# Patient Record
Sex: Female | Born: 1950 | Race: Black or African American | Hispanic: No | Smoking: Former smoker
Health system: Southern US, Community
[De-identification: ages and names within clinical notes are randomized; demographics above are authoritative.]

## PROBLEM LIST (undated history)

## (undated) DIAGNOSIS — I472 Ventricular tachycardia, unspecified: Secondary | ICD-10-CM

## (undated) DIAGNOSIS — M1990 Unspecified osteoarthritis, unspecified site: Secondary | ICD-10-CM

## (undated) DIAGNOSIS — I340 Nonrheumatic mitral (valve) insufficiency: Secondary | ICD-10-CM

## (undated) DIAGNOSIS — I471 Supraventricular tachycardia: Secondary | ICD-10-CM

## (undated) DIAGNOSIS — M810 Age-related osteoporosis without current pathological fracture: Secondary | ICD-10-CM

## (undated) DIAGNOSIS — I480 Paroxysmal atrial fibrillation: Secondary | ICD-10-CM

## (undated) DIAGNOSIS — E785 Hyperlipidemia, unspecified: Secondary | ICD-10-CM

## (undated) DIAGNOSIS — E039 Hypothyroidism, unspecified: Secondary | ICD-10-CM

## (undated) DIAGNOSIS — K5792 Diverticulitis of intestine, part unspecified, without perforation or abscess without bleeding: Secondary | ICD-10-CM

## (undated) DIAGNOSIS — N183 Chronic kidney disease, stage 3 unspecified: Secondary | ICD-10-CM

## (undated) DIAGNOSIS — I255 Ischemic cardiomyopathy: Secondary | ICD-10-CM

## (undated) DIAGNOSIS — I513 Intracardiac thrombosis, not elsewhere classified: Secondary | ICD-10-CM

## (undated) DIAGNOSIS — I219 Acute myocardial infarction, unspecified: Secondary | ICD-10-CM

## (undated) DIAGNOSIS — M4800 Spinal stenosis, site unspecified: Secondary | ICD-10-CM

## (undated) DIAGNOSIS — K219 Gastro-esophageal reflux disease without esophagitis: Secondary | ICD-10-CM

## (undated) DIAGNOSIS — Z95810 Presence of automatic (implantable) cardiac defibrillator: Secondary | ICD-10-CM

## (undated) DIAGNOSIS — B370 Candidal stomatitis: Secondary | ICD-10-CM

## (undated) DIAGNOSIS — K2950 Unspecified chronic gastritis without bleeding: Secondary | ICD-10-CM

## (undated) DIAGNOSIS — Z9289 Personal history of other medical treatment: Secondary | ICD-10-CM

## (undated) DIAGNOSIS — R200 Anesthesia of skin: Secondary | ICD-10-CM

## (undated) DIAGNOSIS — I4719 Other supraventricular tachycardia: Secondary | ICD-10-CM

## (undated) DIAGNOSIS — I10 Essential (primary) hypertension: Secondary | ICD-10-CM

## (undated) DIAGNOSIS — R001 Bradycardia, unspecified: Secondary | ICD-10-CM

## (undated) DIAGNOSIS — IMO0002 Reserved for concepts with insufficient information to code with codable children: Secondary | ICD-10-CM

## (undated) DIAGNOSIS — Z8719 Personal history of other diseases of the digestive system: Secondary | ICD-10-CM

## (undated) DIAGNOSIS — I639 Cerebral infarction, unspecified: Secondary | ICD-10-CM

## (undated) DIAGNOSIS — I5022 Chronic systolic (congestive) heart failure: Secondary | ICD-10-CM

## (undated) DIAGNOSIS — K559 Vascular disorder of intestine, unspecified: Secondary | ICD-10-CM

## (undated) DIAGNOSIS — D649 Anemia, unspecified: Secondary | ICD-10-CM

## (undated) DIAGNOSIS — I2510 Atherosclerotic heart disease of native coronary artery without angina pectoris: Secondary | ICD-10-CM

## (undated) DIAGNOSIS — I4892 Unspecified atrial flutter: Secondary | ICD-10-CM

## (undated) DIAGNOSIS — T82198A Other mechanical complication of other cardiac electronic device, initial encounter: Secondary | ICD-10-CM

## (undated) DIAGNOSIS — R011 Cardiac murmur, unspecified: Secondary | ICD-10-CM

## (undated) DIAGNOSIS — Z8673 Personal history of transient ischemic attack (TIA), and cerebral infarction without residual deficits: Secondary | ICD-10-CM

## (undated) HISTORY — PX: GLAUCOMA SURGERY: SHX656

## (undated) HISTORY — DX: Spinal stenosis, site unspecified: M48.00

## (undated) HISTORY — DX: Unspecified osteoarthritis, unspecified site: M19.90

## (undated) HISTORY — DX: Atherosclerotic heart disease of native coronary artery without angina pectoris: I25.10

## (undated) HISTORY — DX: Diverticulitis of intestine, part unspecified, without perforation or abscess without bleeding: K57.92

## (undated) HISTORY — PX: CORONARY ANGIOPLASTY WITH STENT PLACEMENT: SHX49

## (undated) HISTORY — DX: Ischemic cardiomyopathy: I25.5

## (undated) HISTORY — PX: JOINT REPLACEMENT: SHX530

## (undated) HISTORY — DX: Essential (primary) hypertension: I10

## (undated) HISTORY — DX: Vascular disorder of intestine, unspecified: K55.9

## (undated) HISTORY — DX: Anesthesia of skin: R20.0

## (undated) HISTORY — PX: BACK SURGERY: SHX140

## (undated) HISTORY — DX: Chronic kidney disease, stage 3 unspecified: N18.30

## (undated) HISTORY — DX: Intracardiac thrombosis, not elsewhere classified: I51.3

## (undated) HISTORY — DX: Chronic kidney disease, stage 3 (moderate): N18.3

## (undated) HISTORY — DX: Hypothyroidism, unspecified: E03.9

## (undated) HISTORY — PX: KNEE ARTHROSCOPY: SHX127

## (undated) HISTORY — DX: Cerebral infarction, unspecified: I63.9

## (undated) HISTORY — DX: Other mechanical complication of other cardiac electronic device, initial encounter: T82.198A

## (undated) HISTORY — DX: Gastro-esophageal reflux disease without esophagitis: K21.9

## (undated) HISTORY — DX: Hyperlipidemia, unspecified: E78.5

## (undated) HISTORY — PX: CARDIAC DEFIBRILLATOR PLACEMENT: SHX171

---
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
## 1978-11-20 HISTORY — PX: KNEE ARTHROSCOPY: SHX127

## 1997-10-30 ENCOUNTER — Encounter: Admission: RE | Admit: 1997-10-30 | Discharge: 1998-01-28 | Payer: Self-pay | Admitting: Anesthesiology

## 1998-02-06 ENCOUNTER — Encounter: Admission: RE | Admit: 1998-02-06 | Discharge: 1998-04-30 | Payer: Self-pay

## 1998-03-21 DIAGNOSIS — I219 Acute myocardial infarction, unspecified: Secondary | ICD-10-CM

## 1998-03-21 HISTORY — DX: Acute myocardial infarction, unspecified: I21.9

## 1998-05-13 ENCOUNTER — Ambulatory Visit (HOSPITAL_COMMUNITY): Admission: RE | Admit: 1998-05-13 | Discharge: 1998-05-13 | Payer: Self-pay | Admitting: Gastroenterology

## 1998-07-30 ENCOUNTER — Encounter: Admission: RE | Admit: 1998-07-30 | Discharge: 1998-10-28 | Payer: Self-pay | Admitting: Anesthesiology

## 1998-07-31 ENCOUNTER — Encounter: Payer: Self-pay | Admitting: Anesthesiology

## 1999-09-16 ENCOUNTER — Encounter: Admission: RE | Admit: 1999-09-16 | Discharge: 1999-12-15 | Payer: Self-pay | Admitting: Anesthesiology

## 1999-10-07 ENCOUNTER — Encounter: Admission: RE | Admit: 1999-10-07 | Discharge: 1999-10-07 | Payer: Self-pay | Admitting: Internal Medicine

## 1999-10-07 ENCOUNTER — Encounter: Payer: Self-pay | Admitting: Internal Medicine

## 2000-06-29 ENCOUNTER — Encounter: Payer: Self-pay | Admitting: Emergency Medicine

## 2000-06-29 ENCOUNTER — Emergency Department (HOSPITAL_COMMUNITY): Admission: EM | Admit: 2000-06-29 | Discharge: 2000-06-29 | Payer: Self-pay | Admitting: Emergency Medicine

## 2000-10-05 ENCOUNTER — Encounter: Payer: Self-pay | Admitting: Internal Medicine

## 2000-10-05 ENCOUNTER — Ambulatory Visit (HOSPITAL_COMMUNITY): Admission: RE | Admit: 2000-10-05 | Discharge: 2000-10-05 | Payer: Self-pay | Admitting: Internal Medicine

## 2001-10-19 HISTORY — PX: LUMBAR LAMINECTOMY/DECOMPRESSION MICRODISCECTOMY: SHX5026

## 2001-10-23 ENCOUNTER — Encounter: Payer: Self-pay | Admitting: Neurosurgery

## 2001-10-24 ENCOUNTER — Encounter: Payer: Self-pay | Admitting: Neurosurgery

## 2001-10-24 ENCOUNTER — Inpatient Hospital Stay (HOSPITAL_COMMUNITY): Admission: RE | Admit: 2001-10-24 | Discharge: 2001-10-26 | Payer: Self-pay | Admitting: Neurosurgery

## 2001-11-03 ENCOUNTER — Encounter: Payer: Self-pay | Admitting: Cardiology

## 2001-11-03 ENCOUNTER — Inpatient Hospital Stay (HOSPITAL_COMMUNITY): Admission: EM | Admit: 2001-11-03 | Discharge: 2001-11-10 | Payer: Self-pay | Admitting: Emergency Medicine

## 2001-11-05 ENCOUNTER — Encounter: Payer: Self-pay | Admitting: Cardiology

## 2001-11-06 ENCOUNTER — Encounter: Payer: Self-pay | Admitting: Cardiology

## 2001-11-25 ENCOUNTER — Inpatient Hospital Stay (HOSPITAL_COMMUNITY): Admission: EM | Admit: 2001-11-25 | Discharge: 2001-11-27 | Payer: Self-pay

## 2002-02-07 ENCOUNTER — Encounter: Payer: Self-pay | Admitting: Emergency Medicine

## 2002-02-08 ENCOUNTER — Encounter: Payer: Self-pay | Admitting: Cardiology

## 2002-02-08 ENCOUNTER — Encounter: Payer: Self-pay | Admitting: Cardiovascular Disease

## 2002-02-08 ENCOUNTER — Inpatient Hospital Stay (HOSPITAL_COMMUNITY): Admission: EM | Admit: 2002-02-08 | Discharge: 2002-02-10 | Payer: Self-pay | Admitting: Emergency Medicine

## 2002-05-07 ENCOUNTER — Encounter: Admission: RE | Admit: 2002-05-07 | Discharge: 2002-05-07 | Payer: Self-pay | Admitting: Internal Medicine

## 2002-05-07 ENCOUNTER — Encounter: Payer: Self-pay | Admitting: Internal Medicine

## 2002-05-13 ENCOUNTER — Encounter (HOSPITAL_COMMUNITY): Admission: RE | Admit: 2002-05-13 | Discharge: 2002-08-11 | Payer: Self-pay | Admitting: Cardiology

## 2002-06-12 ENCOUNTER — Encounter: Payer: Self-pay | Admitting: Cardiology

## 2002-06-12 ENCOUNTER — Inpatient Hospital Stay (HOSPITAL_COMMUNITY): Admission: RE | Admit: 2002-06-12 | Discharge: 2002-06-14 | Payer: Self-pay | Admitting: Cardiology

## 2002-08-12 ENCOUNTER — Encounter (HOSPITAL_COMMUNITY): Admission: RE | Admit: 2002-08-12 | Discharge: 2002-11-10 | Payer: Self-pay | Admitting: Cardiology

## 2002-09-05 ENCOUNTER — Emergency Department (HOSPITAL_COMMUNITY): Admission: EM | Admit: 2002-09-05 | Discharge: 2002-09-05 | Payer: Self-pay | Admitting: Emergency Medicine

## 2002-09-05 ENCOUNTER — Encounter: Payer: Self-pay | Admitting: Emergency Medicine

## 2002-10-07 ENCOUNTER — Ambulatory Visit (HOSPITAL_COMMUNITY): Admission: RE | Admit: 2002-10-07 | Discharge: 2002-10-08 | Payer: Self-pay | Admitting: Cardiology

## 2003-03-05 ENCOUNTER — Ambulatory Visit (HOSPITAL_COMMUNITY): Admission: RE | Admit: 2003-03-05 | Discharge: 2003-03-05 | Payer: Self-pay | Admitting: Gastroenterology

## 2003-03-08 ENCOUNTER — Inpatient Hospital Stay (HOSPITAL_COMMUNITY): Admission: EM | Admit: 2003-03-08 | Discharge: 2003-03-12 | Payer: Self-pay | Admitting: Emergency Medicine

## 2003-03-11 ENCOUNTER — Encounter: Payer: Self-pay | Admitting: Cardiology

## 2003-03-14 ENCOUNTER — Ambulatory Visit (HOSPITAL_COMMUNITY): Admission: RE | Admit: 2003-03-14 | Discharge: 2003-03-14 | Payer: Self-pay | Admitting: Neurology

## 2003-07-31 ENCOUNTER — Inpatient Hospital Stay (HOSPITAL_COMMUNITY): Admission: AD | Admit: 2003-07-31 | Discharge: 2003-08-01 | Payer: Self-pay | Admitting: Internal Medicine

## 2003-11-17 ENCOUNTER — Encounter: Admission: RE | Admit: 2003-11-17 | Discharge: 2003-11-17 | Payer: Self-pay | Admitting: Nephrology

## 2003-11-20 HISTORY — PX: TOTAL KNEE ARTHROPLASTY: SHX125

## 2003-12-02 ENCOUNTER — Encounter: Admission: RE | Admit: 2003-12-02 | Discharge: 2003-12-02 | Payer: Self-pay | Admitting: Nephrology

## 2003-12-17 ENCOUNTER — Inpatient Hospital Stay (HOSPITAL_COMMUNITY): Admission: RE | Admit: 2003-12-17 | Discharge: 2003-12-22 | Payer: Self-pay | Admitting: Orthopedic Surgery

## 2003-12-17 ENCOUNTER — Ambulatory Visit: Payer: Self-pay | Admitting: Physical Medicine & Rehabilitation

## 2004-01-23 ENCOUNTER — Encounter: Admission: RE | Admit: 2004-01-23 | Discharge: 2004-04-16 | Payer: Self-pay | Admitting: Orthopedic Surgery

## 2004-06-01 ENCOUNTER — Encounter: Admission: RE | Admit: 2004-06-01 | Discharge: 2004-07-14 | Payer: Self-pay | Admitting: Neurosurgery

## 2004-06-15 ENCOUNTER — Encounter: Admission: RE | Admit: 2004-06-15 | Discharge: 2004-06-15 | Payer: Self-pay | Admitting: Internal Medicine

## 2004-07-29 ENCOUNTER — Ambulatory Visit (HOSPITAL_COMMUNITY): Admission: RE | Admit: 2004-07-29 | Discharge: 2004-07-29 | Payer: Self-pay | Admitting: Neurosurgery

## 2004-08-19 ENCOUNTER — Ambulatory Visit: Payer: Self-pay | Admitting: Internal Medicine

## 2004-08-19 ENCOUNTER — Ambulatory Visit: Payer: Self-pay

## 2004-10-07 ENCOUNTER — Ambulatory Visit: Payer: Self-pay | Admitting: Cardiology

## 2004-11-02 ENCOUNTER — Ambulatory Visit: Payer: Self-pay | Admitting: Internal Medicine

## 2004-11-03 ENCOUNTER — Ambulatory Visit: Payer: Self-pay | Admitting: Internal Medicine

## 2004-11-05 ENCOUNTER — Inpatient Hospital Stay (HOSPITAL_COMMUNITY): Admission: RE | Admit: 2004-11-05 | Discharge: 2004-11-05 | Payer: Self-pay | Admitting: Internal Medicine

## 2004-11-24 ENCOUNTER — Ambulatory Visit: Payer: Self-pay

## 2004-11-24 ENCOUNTER — Ambulatory Visit: Payer: Self-pay | Admitting: Internal Medicine

## 2004-12-19 HISTORY — PX: X-STOP IMPLANTATION: SHX2677

## 2004-12-20 ENCOUNTER — Inpatient Hospital Stay (HOSPITAL_COMMUNITY): Admission: RE | Admit: 2004-12-20 | Discharge: 2004-12-22 | Payer: Self-pay | Admitting: Neurosurgery

## 2005-01-25 ENCOUNTER — Ambulatory Visit: Payer: Self-pay | Admitting: Internal Medicine

## 2005-03-29 ENCOUNTER — Encounter: Admission: RE | Admit: 2005-03-29 | Discharge: 2005-06-27 | Payer: Self-pay | Admitting: Neurosurgery

## 2005-06-07 ENCOUNTER — Inpatient Hospital Stay (HOSPITAL_COMMUNITY): Admission: EM | Admit: 2005-06-07 | Discharge: 2005-06-12 | Payer: Self-pay | Admitting: *Deleted

## 2005-06-22 ENCOUNTER — Encounter: Admission: RE | Admit: 2005-06-22 | Discharge: 2005-06-22 | Payer: Self-pay | Admitting: Internal Medicine

## 2005-08-23 ENCOUNTER — Encounter: Admission: RE | Admit: 2005-08-23 | Discharge: 2005-09-15 | Payer: Self-pay | Admitting: Neurosurgery

## 2006-04-07 ENCOUNTER — Encounter: Admission: RE | Admit: 2006-04-07 | Discharge: 2006-04-07 | Payer: Self-pay | Admitting: Cardiology

## 2006-06-23 ENCOUNTER — Emergency Department (HOSPITAL_COMMUNITY): Admission: EM | Admit: 2006-06-23 | Discharge: 2006-06-23 | Payer: Self-pay | Admitting: Emergency Medicine

## 2006-06-27 ENCOUNTER — Encounter: Admission: RE | Admit: 2006-06-27 | Discharge: 2006-06-27 | Payer: Self-pay | Admitting: Internal Medicine

## 2007-03-31 ENCOUNTER — Inpatient Hospital Stay (HOSPITAL_COMMUNITY): Admission: EM | Admit: 2007-03-31 | Discharge: 2007-04-01 | Payer: Self-pay | Admitting: Emergency Medicine

## 2007-05-02 ENCOUNTER — Encounter
Admission: RE | Admit: 2007-05-02 | Discharge: 2007-07-17 | Payer: Self-pay | Admitting: Physical Medicine and Rehabilitation

## 2007-06-28 ENCOUNTER — Emergency Department (HOSPITAL_COMMUNITY): Admission: EM | Admit: 2007-06-28 | Discharge: 2007-06-28 | Payer: Self-pay | Admitting: Emergency Medicine

## 2007-07-10 ENCOUNTER — Encounter: Admission: RE | Admit: 2007-07-10 | Discharge: 2007-07-10 | Payer: Self-pay | Admitting: Orthopedic Surgery

## 2007-07-20 HISTORY — PX: FIXATION KYPHOPLASTY THORACIC SPINE: SHX1643

## 2007-08-09 ENCOUNTER — Encounter (INDEPENDENT_AMBULATORY_CARE_PROVIDER_SITE_OTHER): Payer: Self-pay | Admitting: Neurosurgery

## 2007-08-09 ENCOUNTER — Inpatient Hospital Stay (HOSPITAL_COMMUNITY): Admission: RE | Admit: 2007-08-09 | Discharge: 2007-08-10 | Payer: Self-pay | Admitting: Neurosurgery

## 2007-11-27 ENCOUNTER — Encounter: Admission: RE | Admit: 2007-11-27 | Discharge: 2007-11-27 | Payer: Self-pay | Admitting: Internal Medicine

## 2008-03-31 ENCOUNTER — Encounter: Admission: RE | Admit: 2008-03-31 | Discharge: 2008-03-31 | Payer: Self-pay | Admitting: Orthopedic Surgery

## 2008-04-21 HISTORY — PX: SHOULDER OPEN ROTATOR CUFF REPAIR: SHX2407

## 2008-05-14 ENCOUNTER — Inpatient Hospital Stay (HOSPITAL_COMMUNITY): Admission: AD | Admit: 2008-05-14 | Discharge: 2008-05-16 | Payer: Self-pay | Admitting: Orthopedic Surgery

## 2008-10-19 LAB — CONVERTED CEMR LAB: Pap Smear: NORMAL

## 2008-11-20 ENCOUNTER — Telehealth (INDEPENDENT_AMBULATORY_CARE_PROVIDER_SITE_OTHER): Payer: Self-pay | Admitting: *Deleted

## 2009-02-23 ENCOUNTER — Encounter (INDEPENDENT_AMBULATORY_CARE_PROVIDER_SITE_OTHER): Payer: Self-pay | Admitting: *Deleted

## 2009-02-26 ENCOUNTER — Encounter: Admission: RE | Admit: 2009-02-26 | Discharge: 2009-02-26 | Payer: Self-pay | Admitting: Gastroenterology

## 2009-03-18 ENCOUNTER — Ambulatory Visit: Payer: Self-pay | Admitting: Internal Medicine

## 2009-03-18 ENCOUNTER — Inpatient Hospital Stay (HOSPITAL_COMMUNITY): Admission: EM | Admit: 2009-03-18 | Discharge: 2009-03-23 | Payer: Self-pay | Admitting: Emergency Medicine

## 2009-04-29 ENCOUNTER — Ambulatory Visit: Payer: Self-pay | Admitting: Cardiovascular Disease

## 2009-04-29 ENCOUNTER — Observation Stay (HOSPITAL_COMMUNITY): Admission: EM | Admit: 2009-04-29 | Discharge: 2009-04-30 | Payer: Self-pay | Admitting: Emergency Medicine

## 2009-07-14 ENCOUNTER — Inpatient Hospital Stay (HOSPITAL_COMMUNITY)
Admission: EM | Admit: 2009-07-14 | Discharge: 2009-07-16 | Payer: Self-pay | Source: Home / Self Care | Admitting: Emergency Medicine

## 2009-07-14 ENCOUNTER — Encounter: Payer: Self-pay | Admitting: Cardiology

## 2009-07-14 ENCOUNTER — Ambulatory Visit: Payer: Self-pay | Admitting: Vascular Surgery

## 2009-07-14 ENCOUNTER — Ambulatory Visit: Payer: Self-pay | Admitting: Internal Medicine

## 2009-07-15 ENCOUNTER — Encounter: Payer: Self-pay | Admitting: Cardiology

## 2009-08-21 ENCOUNTER — Encounter: Admission: RE | Admit: 2009-08-21 | Discharge: 2009-08-21 | Payer: Self-pay | Admitting: Surgery

## 2009-10-22 ENCOUNTER — Ambulatory Visit (HOSPITAL_COMMUNITY): Admission: RE | Admit: 2009-10-22 | Discharge: 2009-10-22 | Payer: Self-pay | Admitting: Surgery

## 2009-10-28 ENCOUNTER — Inpatient Hospital Stay (HOSPITAL_COMMUNITY): Admission: EM | Admit: 2009-10-28 | Discharge: 2009-11-05 | Payer: Self-pay | Admitting: Emergency Medicine

## 2009-11-03 ENCOUNTER — Encounter (INDEPENDENT_AMBULATORY_CARE_PROVIDER_SITE_OTHER): Payer: Self-pay | Admitting: Internal Medicine

## 2009-11-11 ENCOUNTER — Encounter: Payer: Self-pay | Admitting: Internal Medicine

## 2009-11-11 ENCOUNTER — Ambulatory Visit: Payer: Self-pay | Admitting: Cardiology

## 2009-11-20 ENCOUNTER — Ambulatory Visit: Payer: Self-pay | Admitting: Internal Medicine

## 2009-11-20 DIAGNOSIS — I252 Old myocardial infarction: Secondary | ICD-10-CM

## 2009-11-20 DIAGNOSIS — I5042 Chronic combined systolic (congestive) and diastolic (congestive) heart failure: Secondary | ICD-10-CM

## 2009-11-20 DIAGNOSIS — M1990 Unspecified osteoarthritis, unspecified site: Secondary | ICD-10-CM | POA: Insufficient documentation

## 2009-11-20 DIAGNOSIS — Z8719 Personal history of other diseases of the digestive system: Secondary | ICD-10-CM

## 2009-11-20 DIAGNOSIS — E039 Hypothyroidism, unspecified: Secondary | ICD-10-CM | POA: Insufficient documentation

## 2009-11-20 LAB — CONVERTED CEMR LAB
ALT: 18 units/L (ref 0–35)
AST: 35 units/L (ref 0–37)
Alkaline Phosphatase: 38 units/L — ABNORMAL LOW (ref 39–117)
Basophils Relative: 0.2 % (ref 0.0–3.0)
Bilirubin, Direct: 0.2 mg/dL (ref 0.0–0.3)
Calcium: 8.7 mg/dL (ref 8.4–10.5)
Creatinine, Ser: 1 mg/dL (ref 0.4–1.2)
Eosinophils Absolute: 0.2 10*3/uL (ref 0.0–0.7)
Eosinophils Relative: 3.8 % (ref 0.0–5.0)
GFR calc non Af Amer: 71.89 mL/min (ref >60)
Hemoglobin: 12.3 g/dL (ref 12.0–15.0)
Lymphocytes Relative: 25.5 % (ref 12.0–46.0)
Monocytes Relative: 11.4 % (ref 3.0–12.0)
Neutro Abs: 3.2 10*3/uL (ref 1.4–7.7)
Neutrophils Relative %: 59.1 % (ref 43.0–77.0)
RBC: 3.5 M/uL — ABNORMAL LOW (ref 3.87–5.11)
Sodium: 139 meq/L (ref 135–145)
Total Protein: 6.6 g/dL (ref 6.0–8.3)
WBC: 5.4 10*3/uL (ref 4.5–10.5)

## 2009-11-22 DIAGNOSIS — I10 Essential (primary) hypertension: Secondary | ICD-10-CM | POA: Insufficient documentation

## 2009-11-22 DIAGNOSIS — E785 Hyperlipidemia, unspecified: Secondary | ICD-10-CM

## 2009-12-02 ENCOUNTER — Ambulatory Visit: Payer: Self-pay | Admitting: Internal Medicine

## 2009-12-08 ENCOUNTER — Encounter: Payer: Self-pay | Admitting: Internal Medicine

## 2009-12-15 ENCOUNTER — Ambulatory Visit: Payer: Self-pay | Admitting: Cardiology

## 2009-12-15 ENCOUNTER — Encounter: Payer: Self-pay | Admitting: Internal Medicine

## 2009-12-29 ENCOUNTER — Encounter: Payer: Self-pay | Admitting: Internal Medicine

## 2010-01-01 ENCOUNTER — Encounter (HOSPITAL_COMMUNITY)
Admission: RE | Admit: 2010-01-01 | Discharge: 2010-03-20 | Payer: Self-pay | Source: Home / Self Care | Attending: Cardiology | Admitting: Cardiology

## 2010-01-13 ENCOUNTER — Encounter: Admission: RE | Admit: 2010-01-13 | Discharge: 2010-01-13 | Payer: Self-pay | Admitting: Orthopedic Surgery

## 2010-01-13 ENCOUNTER — Telehealth: Payer: Self-pay | Admitting: Internal Medicine

## 2010-01-19 HISTORY — PX: LUMBAR LAMINECTOMY/DECOMPRESSION MICRODISCECTOMY: SHX5026

## 2010-01-20 ENCOUNTER — Encounter: Payer: Self-pay | Admitting: Internal Medicine

## 2010-01-21 ENCOUNTER — Ambulatory Visit: Payer: Self-pay | Admitting: Cardiology

## 2010-01-22 ENCOUNTER — Ambulatory Visit (HOSPITAL_COMMUNITY): Admission: RE | Admit: 2010-01-22 | Discharge: 2010-01-22 | Payer: Self-pay | Admitting: Neurosurgery

## 2010-01-27 ENCOUNTER — Encounter: Payer: Self-pay | Admitting: Internal Medicine

## 2010-02-01 ENCOUNTER — Ambulatory Visit: Payer: Self-pay | Admitting: Cardiology

## 2010-02-01 ENCOUNTER — Ambulatory Visit: Payer: Self-pay | Admitting: Internal Medicine

## 2010-02-08 ENCOUNTER — Ambulatory Visit: Payer: Self-pay | Admitting: Cardiology

## 2010-02-08 ENCOUNTER — Inpatient Hospital Stay (HOSPITAL_COMMUNITY): Admission: RE | Admit: 2010-02-08 | Discharge: 2010-02-16 | Payer: Self-pay | Admitting: Neurosurgery

## 2010-02-08 ENCOUNTER — Ambulatory Visit: Payer: Self-pay | Admitting: Internal Medicine

## 2010-02-15 ENCOUNTER — Ambulatory Visit: Payer: Self-pay | Admitting: Physical Medicine & Rehabilitation

## 2010-02-16 ENCOUNTER — Inpatient Hospital Stay (HOSPITAL_COMMUNITY)
Admission: RE | Admit: 2010-02-16 | Discharge: 2010-02-26 | Payer: Self-pay | Attending: Physical Medicine & Rehabilitation | Admitting: Physical Medicine & Rehabilitation

## 2010-02-24 ENCOUNTER — Encounter (INDEPENDENT_AMBULATORY_CARE_PROVIDER_SITE_OTHER): Payer: Self-pay | Admitting: *Deleted

## 2010-02-26 ENCOUNTER — Encounter: Payer: Self-pay | Admitting: Internal Medicine

## 2010-03-20 ENCOUNTER — Inpatient Hospital Stay (HOSPITAL_COMMUNITY)
Admission: EM | Admit: 2010-03-20 | Discharge: 2010-04-07 | Disposition: A | Discharge status: Rehabilitation Facility | Payer: Self-pay | Source: Home / Self Care | Attending: Internal Medicine | Admitting: Internal Medicine

## 2010-03-21 HISTORY — PX: COLECTOMY: SHX59

## 2010-03-21 HISTORY — PX: APPENDECTOMY: SHX54

## 2010-03-21 HISTORY — PX: PERIPHERALLY INSERTED CENTRAL CATHETER INSERTION: SHX2221

## 2010-03-22 ENCOUNTER — Encounter: Payer: Self-pay | Admitting: Internal Medicine

## 2010-03-24 LAB — COMPREHENSIVE METABOLIC PANEL
ALT: 11 U/L (ref 0–35)
AST: 18 U/L (ref 0–37)
Albumin: 2.8 g/dL — ABNORMAL LOW (ref 3.5–5.2)
Alkaline Phosphatase: 86 U/L (ref 39–117)
BUN: 11 mg/dL (ref 6–23)
CO2: 26 mEq/L (ref 19–32)
Calcium: 8.3 mg/dL — ABNORMAL LOW (ref 8.4–10.5)
Chloride: 99 mEq/L (ref 96–112)
Creatinine, Ser: 1.09 mg/dL (ref 0.4–1.2)
GFR calc Af Amer: >60 mL/min (ref >60)
GFR calc non Af Amer: 51 mL/min — ABNORMAL LOW (ref >60)
Glucose, Bld: 147 mg/dL — ABNORMAL HIGH (ref 70–99)
Potassium: 3.3 mEq/L — ABNORMAL LOW (ref 3.5–5.1)
Sodium: 134 mEq/L — ABNORMAL LOW (ref 135–145)
Total Bilirubin: 0.9 mg/dL (ref 0.3–1.2)
Total Protein: 5.8 g/dL — ABNORMAL LOW (ref 6.0–8.3)

## 2010-03-24 LAB — TRIGLYCERIDES: Triglycerides: 153 mg/dL — ABNORMAL HIGH (ref <150)

## 2010-03-24 LAB — CBC
HCT: 33.1 % — ABNORMAL LOW (ref 36.0–46.0)
Hemoglobin: 11.1 g/dL — ABNORMAL LOW (ref 12.0–15.0)
MCH: 32.2 pg (ref 26.0–34.0)
MCHC: 33.5 g/dL (ref 30.0–36.0)
MCV: 95.9 fL (ref 78.0–100.0)
Platelets: 180 10*3/uL (ref 150–400)
RBC: 3.45 MIL/uL — ABNORMAL LOW (ref 3.87–5.11)
RDW: 16 % — ABNORMAL HIGH (ref 11.5–15.5)
WBC: 5.4 10*3/uL (ref 4.0–10.5)

## 2010-03-24 LAB — CHOLESTEROL, TOTAL: Cholesterol: 199 mg/dL (ref 0–200)

## 2010-03-24 LAB — APTT: aPTT: 41 seconds — ABNORMAL HIGH (ref 24–37)

## 2010-03-24 LAB — MAGNESIUM: Magnesium: 1.9 mg/dL (ref 1.5–2.5)

## 2010-03-24 LAB — TYPE AND SCREEN
ABO/RH(D): A POS
Antibody Screen: NEGATIVE

## 2010-03-24 LAB — PROTIME-INR
INR: 1.46 (ref 0.00–1.49)
Prothrombin Time: 17.9 seconds — ABNORMAL HIGH (ref 11.6–15.2)

## 2010-03-24 LAB — GLUCOSE, CAPILLARY
Glucose-Capillary: 136 mg/dL — ABNORMAL HIGH (ref 70–99)
Glucose-Capillary: 150 mg/dL — ABNORMAL HIGH (ref 70–99)
Glucose-Capillary: 162 mg/dL — ABNORMAL HIGH (ref 70–99)

## 2010-03-24 LAB — PREALBUMIN: Prealbumin: 13 mg/dL — ABNORMAL LOW (ref 17.0–34.0)

## 2010-03-24 LAB — PHOSPHORUS: Phosphorus: 2.9 mg/dL (ref 2.3–4.6)

## 2010-03-25 ENCOUNTER — Encounter (INDEPENDENT_AMBULATORY_CARE_PROVIDER_SITE_OTHER): Payer: Self-pay | Admitting: Internal Medicine

## 2010-03-25 LAB — GLUCOSE, CAPILLARY
Glucose-Capillary: 181 mg/dL — ABNORMAL HIGH (ref 70–99)
Glucose-Capillary: 166 mg/dL — ABNORMAL HIGH (ref 70–99)
Glucose-Capillary: 204 mg/dL — ABNORMAL HIGH (ref 70–99)
Glucose-Capillary: 204 mg/dL — ABNORMAL HIGH (ref 70–99)

## 2010-03-25 LAB — CBC
HCT: 36.9 % (ref 36.0–46.0)
Hemoglobin: 12.4 g/dL (ref 12.0–15.0)
MCH: 32.3 pg (ref 26.0–34.0)
MCHC: 33.6 g/dL (ref 30.0–36.0)
MCV: 96.1 fL (ref 78.0–100.0)
Platelets: 168 10*3/uL (ref 150–400)
RBC: 3.84 MIL/uL — ABNORMAL LOW (ref 3.87–5.11)
RDW: 15.9 % — ABNORMAL HIGH (ref 11.5–15.5)
WBC: 5 10*3/uL (ref 4.0–10.5)

## 2010-03-25 LAB — DIFFERENTIAL
Basophils Absolute: 0 10*3/uL (ref 0.0–0.1)
Basophils Relative: 0 % (ref 0–1)
Eosinophils Absolute: 0.2 10*3/uL (ref 0.0–0.7)
Eosinophils Relative: 3 % (ref 0–5)
Lymphocytes Relative: 19 % (ref 12–46)
Lymphs Abs: 1 10*3/uL (ref 0.7–4.0)
Monocytes Absolute: 0.7 10*3/uL (ref 0.1–1.0)
Monocytes Relative: 14 % — ABNORMAL HIGH (ref 3–12)
Neutro Abs: 3.5 10*3/uL (ref 1.7–7.7)
Neutrophils Relative %: 64 % (ref 43–77)

## 2010-03-25 LAB — PREPARE FRESH FROZEN PLASMA: Unit division: 0

## 2010-03-25 LAB — PROTIME-INR
INR: 1.2 (ref 0.00–1.49)
Prothrombin Time: 15.4 seconds — ABNORMAL HIGH (ref 11.6–15.2)

## 2010-03-25 LAB — COMPREHENSIVE METABOLIC PANEL
ALT: 9 U/L (ref 0–35)
AST: 18 U/L (ref 0–37)
Albumin: 2.6 g/dL — ABNORMAL LOW (ref 3.5–5.2)
Alkaline Phosphatase: 79 U/L (ref 39–117)
BUN: 18 mg/dL (ref 6–23)
CO2: 28 mEq/L (ref 19–32)
Calcium: 8.3 mg/dL — ABNORMAL LOW (ref 8.4–10.5)
Chloride: 100 mEq/L (ref 96–112)
Creatinine, Ser: 0.96 mg/dL (ref 0.4–1.2)
GFR calc Af Amer: >60 mL/min (ref >60)
GFR calc non Af Amer: 59 mL/min — ABNORMAL LOW (ref >60)
Glucose, Bld: 192 mg/dL — ABNORMAL HIGH (ref 70–99)
Potassium: 3.9 mEq/L (ref 3.5–5.1)
Sodium: 135 mEq/L (ref 135–145)
Total Bilirubin: 0.6 mg/dL (ref 0.3–1.2)
Total Protein: 5.7 g/dL — ABNORMAL LOW (ref 6.0–8.3)

## 2010-03-25 LAB — MRSA PCR SCREENING: MRSA by PCR: NEGATIVE

## 2010-03-25 LAB — ABO/RH: ABO/RH(D): A POS

## 2010-03-25 LAB — PHOSPHORUS: Phosphorus: 3 mg/dL (ref 2.3–4.6)

## 2010-03-25 LAB — MAGNESIUM: Magnesium: 2.3 mg/dL (ref 1.5–2.5)

## 2010-03-26 LAB — COMPREHENSIVE METABOLIC PANEL
ALT: 13 U/L (ref 0–35)
AST: 22 U/L (ref 0–37)
Albumin: 1.9 g/dL — ABNORMAL LOW (ref 3.5–5.2)
Alkaline Phosphatase: 64 U/L (ref 39–117)
BUN: 30 mg/dL — ABNORMAL HIGH (ref 6–23)
CO2: 22 mEq/L (ref 19–32)
Calcium: 7.7 mg/dL — ABNORMAL LOW (ref 8.4–10.5)
Chloride: 104 mEq/L (ref 96–112)
Creatinine, Ser: 1.31 mg/dL — ABNORMAL HIGH (ref 0.4–1.2)
GFR calc Af Amer: 50 mL/min — ABNORMAL LOW (ref >60)
GFR calc non Af Amer: 41 mL/min — ABNORMAL LOW (ref >60)
Glucose, Bld: 274 mg/dL — ABNORMAL HIGH (ref 70–99)
Potassium: 4.3 mEq/L (ref 3.5–5.1)
Sodium: 133 mEq/L — ABNORMAL LOW (ref 135–145)
Total Bilirubin: 1.4 mg/dL — ABNORMAL HIGH (ref 0.3–1.2)
Total Protein: 4.5 g/dL — ABNORMAL LOW (ref 6.0–8.3)

## 2010-03-26 LAB — CBC
HCT: 39.5 % (ref 36.0–46.0)
Hemoglobin: 13.3 g/dL (ref 12.0–15.0)
MCH: 32.4 pg (ref 26.0–34.0)
MCHC: 33.7 g/dL (ref 30.0–36.0)
MCV: 96.3 fL (ref 78.0–100.0)
Platelets: 141 10*3/uL — ABNORMAL LOW (ref 150–400)
RBC: 4.1 MIL/uL (ref 3.87–5.11)
RDW: 16.1 % — ABNORMAL HIGH (ref 11.5–15.5)
WBC: 9.2 10*3/uL (ref 4.0–10.5)

## 2010-03-26 LAB — GLUCOSE, CAPILLARY
Glucose-Capillary: 183 mg/dL — ABNORMAL HIGH (ref 70–99)
Glucose-Capillary: 209 mg/dL — ABNORMAL HIGH (ref 70–99)
Glucose-Capillary: 269 mg/dL — ABNORMAL HIGH (ref 70–99)
Glucose-Capillary: 285 mg/dL — ABNORMAL HIGH (ref 70–99)
Glucose-Capillary: 325 mg/dL — ABNORMAL HIGH (ref 70–99)

## 2010-03-26 LAB — PROTIME-INR
INR: 1.49 (ref 0.00–1.49)
Prothrombin Time: 18.2 seconds — ABNORMAL HIGH (ref 11.6–15.2)

## 2010-03-26 LAB — PHOSPHORUS: Phosphorus: 2.2 mg/dL — ABNORMAL LOW (ref 2.3–4.6)

## 2010-03-26 LAB — MAGNESIUM: Magnesium: 2 mg/dL (ref 1.5–2.5)

## 2010-03-31 ENCOUNTER — Encounter (INDEPENDENT_AMBULATORY_CARE_PROVIDER_SITE_OTHER): Payer: Self-pay | Admitting: *Deleted

## 2010-04-05 LAB — BASIC METABOLIC PANEL
BUN: 35 mg/dL — ABNORMAL HIGH (ref 6–23)
BUN: 35 mg/dL — ABNORMAL HIGH (ref 6–23)
BUN: 32 mg/dL — ABNORMAL HIGH (ref 6–23)
BUN: 19 mg/dL (ref 6–23)
BUN: 11 mg/dL (ref 6–23)
BUN: 11 mg/dL (ref 6–23)
BUN: 9 mg/dL (ref 6–23)
CO2: 25 mEq/L (ref 19–32)
CO2: 24 mEq/L (ref 19–32)
CO2: 23 mEq/L (ref 19–32)
CO2: 27 mEq/L (ref 19–32)
CO2: 29 mEq/L (ref 19–32)
CO2: 29 mEq/L (ref 19–32)
CO2: 29 mEq/L (ref 19–32)
Calcium: 7.5 mg/dL — ABNORMAL LOW (ref 8.4–10.5)
Calcium: 7.5 mg/dL — ABNORMAL LOW (ref 8.4–10.5)
Calcium: 7.2 mg/dL — ABNORMAL LOW (ref 8.4–10.5)
Calcium: 7.8 mg/dL — ABNORMAL LOW (ref 8.4–10.5)
Calcium: 7.7 mg/dL — ABNORMAL LOW (ref 8.4–10.5)
Calcium: 7.5 mg/dL — ABNORMAL LOW (ref 8.4–10.5)
Calcium: 7.6 mg/dL — ABNORMAL LOW (ref 8.4–10.5)
Chloride: 104 mEq/L (ref 96–112)
Chloride: 106 mEq/L (ref 96–112)
Chloride: 106 mEq/L (ref 96–112)
Chloride: 106 mEq/L (ref 96–112)
Chloride: 99 mEq/L (ref 96–112)
Chloride: 98 mEq/L (ref 96–112)
Chloride: 101 mEq/L (ref 96–112)
Creatinine, Ser: 1.31 mg/dL — ABNORMAL HIGH (ref 0.4–1.2)
Creatinine, Ser: 1.22 mg/dL — ABNORMAL HIGH (ref 0.4–1.2)
Creatinine, Ser: 1.01 mg/dL (ref 0.4–1.2)
Creatinine, Ser: 0.77 mg/dL (ref 0.4–1.2)
Creatinine, Ser: 0.79 mg/dL (ref 0.4–1.2)
Creatinine, Ser: 0.98 mg/dL (ref 0.4–1.2)
Creatinine, Ser: 0.99 mg/dL (ref 0.4–1.2)
GFR calc Af Amer: 50 mL/min — ABNORMAL LOW (ref >60)
GFR calc Af Amer: 54 mL/min — ABNORMAL LOW (ref >60)
GFR calc Af Amer: >60 mL/min (ref >60)
GFR calc Af Amer: >60 mL/min (ref >60)
GFR calc Af Amer: >60 mL/min (ref >60)
GFR calc Af Amer: >60 mL/min (ref >60)
GFR calc Af Amer: >60 mL/min (ref >60)
GFR calc non Af Amer: 41 mL/min — ABNORMAL LOW (ref >60)
GFR calc non Af Amer: 45 mL/min — ABNORMAL LOW (ref >60)
GFR calc non Af Amer: 56 mL/min — ABNORMAL LOW (ref >60)
GFR calc non Af Amer: >60 mL/min (ref >60)
GFR calc non Af Amer: >60 mL/min (ref >60)
GFR calc non Af Amer: 58 mL/min — ABNORMAL LOW (ref >60)
GFR calc non Af Amer: 57 mL/min — ABNORMAL LOW (ref >60)
Glucose, Bld: 198 mg/dL — ABNORMAL HIGH (ref 70–99)
Glucose, Bld: 169 mg/dL — ABNORMAL HIGH (ref 70–99)
Glucose, Bld: 85 mg/dL (ref 70–99)
Glucose, Bld: 111 mg/dL — ABNORMAL HIGH (ref 70–99)
Glucose, Bld: 107 mg/dL — ABNORMAL HIGH (ref 70–99)
Glucose, Bld: 94 mg/dL (ref 70–99)
Glucose, Bld: 82 mg/dL (ref 70–99)
Potassium: 4.4 mEq/L (ref 3.5–5.1)
Potassium: 4.5 mEq/L (ref 3.5–5.1)
Potassium: 4.4 mEq/L (ref 3.5–5.1)
Potassium: 4.4 mEq/L (ref 3.5–5.1)
Potassium: 3.9 mEq/L (ref 3.5–5.1)
Potassium: 4 mEq/L (ref 3.5–5.1)
Potassium: 3.2 mEq/L — ABNORMAL LOW (ref 3.5–5.1)
Sodium: 133 mEq/L — ABNORMAL LOW (ref 135–145)
Sodium: 135 mEq/L (ref 135–145)
Sodium: 132 mEq/L — ABNORMAL LOW (ref 135–145)
Sodium: 137 mEq/L (ref 135–145)
Sodium: 135 mEq/L (ref 135–145)
Sodium: 136 mEq/L (ref 135–145)
Sodium: 137 mEq/L (ref 135–145)

## 2010-04-05 LAB — COMPREHENSIVE METABOLIC PANEL
ALT: 11 U/L (ref 0–35)
ALT: 13 U/L (ref 0–35)
ALT: 13 U/L (ref 0–35)
AST: 19 U/L (ref 0–37)
AST: 19 U/L (ref 0–37)
AST: 16 U/L (ref 0–37)
Albumin: 1.8 g/dL — ABNORMAL LOW (ref 3.5–5.2)
Albumin: 1.9 g/dL — ABNORMAL LOW (ref 3.5–5.2)
Albumin: 1.9 g/dL — ABNORMAL LOW (ref 3.5–5.2)
Alkaline Phosphatase: 66 U/L (ref 39–117)
Alkaline Phosphatase: 58 U/L (ref 39–117)
Alkaline Phosphatase: 54 U/L (ref 39–117)
BUN: 36 mg/dL — ABNORMAL HIGH (ref 6–23)
BUN: 25 mg/dL — ABNORMAL HIGH (ref 6–23)
BUN: 15 mg/dL (ref 6–23)
CO2: 20 mEq/L (ref 19–32)
CO2: 25 mEq/L (ref 19–32)
CO2: 27 mEq/L (ref 19–32)
Calcium: 7.3 mg/dL — ABNORMAL LOW (ref 8.4–10.5)
Calcium: 7.6 mg/dL — ABNORMAL LOW (ref 8.4–10.5)
Calcium: 7.9 mg/dL — ABNORMAL LOW (ref 8.4–10.5)
Chloride: 106 mEq/L (ref 96–112)
Chloride: 107 mEq/L (ref 96–112)
Chloride: 105 mEq/L (ref 96–112)
Creatinine, Ser: 0.98 mg/dL (ref 0.4–1.2)
Creatinine, Ser: 0.85 mg/dL (ref 0.4–1.2)
Creatinine, Ser: 0.72 mg/dL (ref 0.4–1.2)
GFR calc Af Amer: >60 mL/min (ref >60)
GFR calc Af Amer: >60 mL/min (ref >60)
GFR calc Af Amer: >60 mL/min (ref >60)
GFR calc non Af Amer: 58 mL/min — ABNORMAL LOW (ref >60)
GFR calc non Af Amer: >60 mL/min (ref >60)
GFR calc non Af Amer: >60 mL/min (ref >60)
Glucose, Bld: 99 mg/dL (ref 70–99)
Glucose, Bld: 133 mg/dL — ABNORMAL HIGH (ref 70–99)
Glucose, Bld: 111 mg/dL — ABNORMAL HIGH (ref 70–99)
Potassium: 4.3 mEq/L (ref 3.5–5.1)
Potassium: 4.3 mEq/L (ref 3.5–5.1)
Potassium: 4.2 mEq/L (ref 3.5–5.1)
Sodium: 132 mEq/L — ABNORMAL LOW (ref 135–145)
Sodium: 136 mEq/L (ref 135–145)
Sodium: 138 mEq/L (ref 135–145)
Total Bilirubin: 0.9 mg/dL (ref 0.3–1.2)
Total Bilirubin: 0.7 mg/dL (ref 0.3–1.2)
Total Bilirubin: 0.5 mg/dL (ref 0.3–1.2)
Total Protein: 4.3 g/dL — ABNORMAL LOW (ref 6.0–8.3)
Total Protein: 4.4 g/dL — ABNORMAL LOW (ref 6.0–8.3)
Total Protein: 4.5 g/dL — ABNORMAL LOW (ref 6.0–8.3)

## 2010-04-05 LAB — GLUCOSE, CAPILLARY
Glucose-Capillary: 173 mg/dL — ABNORMAL HIGH (ref 70–99)
Glucose-Capillary: 174 mg/dL — ABNORMAL HIGH (ref 70–99)
Glucose-Capillary: 166 mg/dL — ABNORMAL HIGH (ref 70–99)
Glucose-Capillary: 177 mg/dL — ABNORMAL HIGH (ref 70–99)
Glucose-Capillary: 180 mg/dL — ABNORMAL HIGH (ref 70–99)
Glucose-Capillary: 116 mg/dL — ABNORMAL HIGH (ref 70–99)
Glucose-Capillary: 129 mg/dL — ABNORMAL HIGH (ref 70–99)
Glucose-Capillary: 129 mg/dL — ABNORMAL HIGH (ref 70–99)
Glucose-Capillary: 73 mg/dL (ref 70–99)
Glucose-Capillary: 84 mg/dL (ref 70–99)
Glucose-Capillary: 92 mg/dL (ref 70–99)
Glucose-Capillary: 99 mg/dL (ref 70–99)
Glucose-Capillary: 101 mg/dL — ABNORMAL HIGH (ref 70–99)
Glucose-Capillary: 102 mg/dL — ABNORMAL HIGH (ref 70–99)
Glucose-Capillary: 105 mg/dL — ABNORMAL HIGH (ref 70–99)
Glucose-Capillary: 109 mg/dL — ABNORMAL HIGH (ref 70–99)
Glucose-Capillary: 117 mg/dL — ABNORMAL HIGH (ref 70–99)
Glucose-Capillary: 117 mg/dL — ABNORMAL HIGH (ref 70–99)
Glucose-Capillary: 129 mg/dL — ABNORMAL HIGH (ref 70–99)
Glucose-Capillary: 161 mg/dL — ABNORMAL HIGH (ref 70–99)
Glucose-Capillary: 184 mg/dL — ABNORMAL HIGH (ref 70–99)
Glucose-Capillary: 71 mg/dL (ref 70–99)
Glucose-Capillary: 76 mg/dL (ref 70–99)
Glucose-Capillary: 85 mg/dL (ref 70–99)
Glucose-Capillary: 88 mg/dL (ref 70–99)
Glucose-Capillary: 93 mg/dL (ref 70–99)
Glucose-Capillary: 97 mg/dL (ref 70–99)
Glucose-Capillary: 102 mg/dL — ABNORMAL HIGH (ref 70–99)
Glucose-Capillary: 107 mg/dL — ABNORMAL HIGH (ref 70–99)
Glucose-Capillary: 115 mg/dL — ABNORMAL HIGH (ref 70–99)
Glucose-Capillary: 119 mg/dL — ABNORMAL HIGH (ref 70–99)
Glucose-Capillary: 100 mg/dL — ABNORMAL HIGH (ref 70–99)
Glucose-Capillary: 102 mg/dL — ABNORMAL HIGH (ref 70–99)
Glucose-Capillary: 102 mg/dL — ABNORMAL HIGH (ref 70–99)
Glucose-Capillary: 110 mg/dL — ABNORMAL HIGH (ref 70–99)
Glucose-Capillary: 113 mg/dL — ABNORMAL HIGH (ref 70–99)
Glucose-Capillary: 117 mg/dL — ABNORMAL HIGH (ref 70–99)
Glucose-Capillary: 120 mg/dL — ABNORMAL HIGH (ref 70–99)
Glucose-Capillary: 124 mg/dL — ABNORMAL HIGH (ref 70–99)
Glucose-Capillary: 126 mg/dL — ABNORMAL HIGH (ref 70–99)
Glucose-Capillary: 67 mg/dL — ABNORMAL LOW (ref 70–99)
Glucose-Capillary: 90 mg/dL (ref 70–99)
Glucose-Capillary: 96 mg/dL (ref 70–99)
Glucose-Capillary: 106 mg/dL — ABNORMAL HIGH (ref 70–99)
Glucose-Capillary: 78 mg/dL (ref 70–99)
Glucose-Capillary: 90 mg/dL (ref 70–99)

## 2010-04-05 LAB — DIFFERENTIAL
Basophils Absolute: 0 10*3/uL (ref 0.0–0.1)
Basophils Absolute: 0 10*3/uL (ref 0.0–0.1)
Basophils Absolute: 0 10*3/uL (ref 0.0–0.1)
Basophils Relative: 0 % (ref 0–1)
Basophils Relative: 0 % (ref 0–1)
Basophils Relative: 0 % (ref 0–1)
Eosinophils Absolute: 0.2 10*3/uL (ref 0.0–0.7)
Eosinophils Absolute: 0.1 10*3/uL (ref 0.0–0.7)
Eosinophils Absolute: 0.1 10*3/uL (ref 0.0–0.7)
Eosinophils Relative: 4 % (ref 0–5)
Eosinophils Relative: 2 % (ref 0–5)
Eosinophils Relative: 2 % (ref 0–5)
Lymphocytes Relative: 18 % (ref 12–46)
Lymphocytes Relative: 21 % (ref 12–46)
Lymphocytes Relative: 17 % (ref 12–46)
Lymphs Abs: 0.9 10*3/uL (ref 0.7–4.0)
Lymphs Abs: 1.1 10*3/uL (ref 0.7–4.0)
Lymphs Abs: 0.9 10*3/uL (ref 0.7–4.0)
Monocytes Absolute: 1.1 10*3/uL — ABNORMAL HIGH (ref 0.1–1.0)
Monocytes Absolute: 1.4 10*3/uL — ABNORMAL HIGH (ref 0.1–1.0)
Monocytes Absolute: 1.3 10*3/uL — ABNORMAL HIGH (ref 0.1–1.0)
Monocytes Relative: 23 % — ABNORMAL HIGH (ref 3–12)
Monocytes Relative: 26 % — ABNORMAL HIGH (ref 3–12)
Monocytes Relative: 23 % — ABNORMAL HIGH (ref 3–12)
Neutro Abs: 2.6 10*3/uL (ref 1.7–7.7)
Neutro Abs: 2.7 10*3/uL (ref 1.7–7.7)
Neutro Abs: 3.2 10*3/uL (ref 1.7–7.7)
Neutrophils Relative %: 55 % (ref 43–77)
Neutrophils Relative %: 51 % (ref 43–77)
Neutrophils Relative %: 58 % (ref 43–77)

## 2010-04-05 LAB — CBC
HCT: 32.2 % — ABNORMAL LOW (ref 36.0–46.0)
HCT: 26.2 % — ABNORMAL LOW (ref 36.0–46.0)
HCT: 27.1 % — ABNORMAL LOW (ref 36.0–46.0)
HCT: 27.4 % — ABNORMAL LOW (ref 36.0–46.0)
HCT: 27.7 % — ABNORMAL LOW (ref 36.0–46.0)
HCT: 29.1 % — ABNORMAL LOW (ref 36.0–46.0)
HCT: 31.7 % — ABNORMAL LOW (ref 36.0–46.0)
HCT: 31.9 % — ABNORMAL LOW (ref 36.0–46.0)
HCT: 29.3 % — ABNORMAL LOW (ref 36.0–46.0)
Hemoglobin: 10.9 g/dL — ABNORMAL LOW (ref 12.0–15.0)
Hemoglobin: 8.7 g/dL — ABNORMAL LOW (ref 12.0–15.0)
Hemoglobin: 9.1 g/dL — ABNORMAL LOW (ref 12.0–15.0)
Hemoglobin: 9.1 g/dL — ABNORMAL LOW (ref 12.0–15.0)
Hemoglobin: 9.2 g/dL — ABNORMAL LOW (ref 12.0–15.0)
Hemoglobin: 9.6 g/dL — ABNORMAL LOW (ref 12.0–15.0)
Hemoglobin: 10.4 g/dL — ABNORMAL LOW (ref 12.0–15.0)
Hemoglobin: 10.4 g/dL — ABNORMAL LOW (ref 12.0–15.0)
Hemoglobin: 9.6 g/dL — ABNORMAL LOW (ref 12.0–15.0)
MCH: 32.1 pg (ref 26.0–34.0)
MCH: 32 pg (ref 26.0–34.0)
MCH: 32.2 pg (ref 26.0–34.0)
MCH: 32 pg (ref 26.0–34.0)
MCH: 31.8 pg (ref 26.0–34.0)
MCH: 31.4 pg (ref 26.0–34.0)
MCH: 31.3 pg (ref 26.0–34.0)
MCH: 30.9 pg (ref 26.0–34.0)
MCH: 31.3 pg (ref 26.0–34.0)
MCHC: 33.9 g/dL (ref 30.0–36.0)
MCHC: 32.8 g/dL (ref 30.0–36.0)
MCHC: 33.6 g/dL (ref 30.0–36.0)
MCHC: 33.2 g/dL (ref 30.0–36.0)
MCHC: 33.2 g/dL (ref 30.0–36.0)
MCHC: 33 g/dL (ref 30.0–36.0)
MCHC: 32.8 g/dL (ref 30.0–36.0)
MCHC: 32.6 g/dL (ref 30.0–36.0)
MCHC: 32.8 g/dL (ref 30.0–36.0)
MCV: 94.7 fL (ref 78.0–100.0)
MCV: 97.4 fL (ref 78.0–100.0)
MCV: 95.8 fL (ref 78.0–100.0)
MCV: 96.5 fL (ref 78.0–100.0)
MCV: 95.8 fL (ref 78.0–100.0)
MCV: 95.1 fL (ref 78.0–100.0)
MCV: 95.5 fL (ref 78.0–100.0)
MCV: 94.7 fL (ref 78.0–100.0)
MCV: 95.4 fL (ref 78.0–100.0)
Platelets: 108 10*3/uL — ABNORMAL LOW (ref 150–400)
Platelets: 79 10*3/uL — ABNORMAL LOW (ref 150–400)
Platelets: 66 10*3/uL — ABNORMAL LOW (ref 150–400)
Platelets: 75 10*3/uL — ABNORMAL LOW (ref 150–400)
Platelets: 108 10*3/uL — ABNORMAL LOW (ref 150–400)
Platelets: 126 10*3/uL — ABNORMAL LOW (ref 150–400)
Platelets: 166 10*3/uL (ref 150–400)
Platelets: 185 10*3/uL (ref 150–400)
Platelets: 193 10*3/uL (ref 150–400)
RBC: 3.4 MIL/uL — ABNORMAL LOW (ref 3.87–5.11)
RBC: 2.69 MIL/uL — ABNORMAL LOW (ref 3.87–5.11)
RBC: 2.83 MIL/uL — ABNORMAL LOW (ref 3.87–5.11)
RBC: 2.84 MIL/uL — ABNORMAL LOW (ref 3.87–5.11)
RBC: 2.89 MIL/uL — ABNORMAL LOW (ref 3.87–5.11)
RBC: 3.06 MIL/uL — ABNORMAL LOW (ref 3.87–5.11)
RBC: 3.32 MIL/uL — ABNORMAL LOW (ref 3.87–5.11)
RBC: 3.37 MIL/uL — ABNORMAL LOW (ref 3.87–5.11)
RBC: 3.07 MIL/uL — ABNORMAL LOW (ref 3.87–5.11)
RDW: 16.2 % — ABNORMAL HIGH (ref 11.5–15.5)
RDW: 16.5 % — ABNORMAL HIGH (ref 11.5–15.5)
RDW: 16.6 % — ABNORMAL HIGH (ref 11.5–15.5)
RDW: 16.4 % — ABNORMAL HIGH (ref 11.5–15.5)
RDW: 15.9 % — ABNORMAL HIGH (ref 11.5–15.5)
RDW: 15.9 % — ABNORMAL HIGH (ref 11.5–15.5)
RDW: 15.8 % — ABNORMAL HIGH (ref 11.5–15.5)
RDW: 15.6 % — ABNORMAL HIGH (ref 11.5–15.5)
RDW: 15.8 % — ABNORMAL HIGH (ref 11.5–15.5)
WBC: 10 10*3/uL (ref 4.0–10.5)
WBC: 6.6 10*3/uL (ref 4.0–10.5)
WBC: 4.8 10*3/uL (ref 4.0–10.5)
WBC: 5.4 10*3/uL (ref 4.0–10.5)
WBC: 5.3 10*3/uL (ref 4.0–10.5)
WBC: 5.5 10*3/uL (ref 4.0–10.5)
WBC: 7 10*3/uL (ref 4.0–10.5)
WBC: 6.6 10*3/uL (ref 4.0–10.5)
WBC: 5.5 10*3/uL (ref 4.0–10.5)

## 2010-04-05 LAB — CROSSMATCH
ABO/RH(D): A POS
Antibody Screen: NEGATIVE
Unit division: 0

## 2010-04-05 LAB — LACTATE DEHYDROGENASE: LDH: 182 U/L (ref 94–250)

## 2010-04-05 LAB — PROTIME-INR
INR: 1.35 (ref 0.00–1.49)
INR: 1.31 (ref 0.00–1.49)
INR: 1.2 (ref 0.00–1.49)
INR: 1.17 (ref 0.00–1.49)
INR: 1.18 (ref 0.00–1.49)
INR: 1.2 (ref 0.00–1.49)
INR: 1.25 (ref 0.00–1.49)
INR: 1.45 (ref 0.00–1.49)
INR: 1.66 — ABNORMAL HIGH (ref 0.00–1.49)
Prothrombin Time: 16.9 seconds — ABNORMAL HIGH (ref 11.6–15.2)
Prothrombin Time: 16.5 seconds — ABNORMAL HIGH (ref 11.6–15.2)
Prothrombin Time: 15.4 seconds — ABNORMAL HIGH (ref 11.6–15.2)
Prothrombin Time: 15.1 seconds (ref 11.6–15.2)
Prothrombin Time: 15.2 seconds (ref 11.6–15.2)
Prothrombin Time: 15.4 seconds — ABNORMAL HIGH (ref 11.6–15.2)
Prothrombin Time: 15.9 seconds — ABNORMAL HIGH (ref 11.6–15.2)
Prothrombin Time: 17.8 seconds — ABNORMAL HIGH (ref 11.6–15.2)
Prothrombin Time: 19.8 seconds — ABNORMAL HIGH (ref 11.6–15.2)

## 2010-04-05 LAB — BRAIN NATRIURETIC PEPTIDE
Pro B Natriuretic peptide (BNP): 1120 pg/mL — ABNORMAL HIGH (ref 0.0–100.0)
Pro B Natriuretic peptide (BNP): 269 pg/mL — ABNORMAL HIGH (ref 0.0–100.0)

## 2010-04-05 LAB — PHOSPHORUS
Phosphorus: 3 mg/dL (ref 2.3–4.6)
Phosphorus: 4.3 mg/dL (ref 2.3–4.6)
Phosphorus: 4 mg/dL (ref 2.3–4.6)

## 2010-04-05 LAB — HEPARIN INDUCED THROMBOCYTOPENIA PNL
Heparin Induced Plt Ab: NEGATIVE
Heparin Induced Plt Ab: NEGATIVE
Patient O.D.: 0.151
Patient O.D.: 0.089
UFH High Dose UFH H: 0 % Release
UFH High Dose UFH H: 0 % Release
UFH Low Dose 0.1 IU/mL: 0 % Release
UFH Low Dose 0.1 IU/mL: 0 % Release
UFH Low Dose 0.5 IU/mL: 0 % Release
UFH Low Dose 0.5 IU/mL: 0 % Release
UFH SRA Result: NEGATIVE
UFH SRA Result: NEGATIVE

## 2010-04-05 LAB — MRSA PCR SCREENING: MRSA by PCR: NEGATIVE

## 2010-04-05 LAB — MAGNESIUM
Magnesium: 2.2 mg/dL (ref 1.5–2.5)
Magnesium: 2.1 mg/dL (ref 1.5–2.5)
Magnesium: 2.1 mg/dL (ref 1.5–2.5)
Magnesium: 1.8 mg/dL (ref 1.5–2.5)

## 2010-04-05 LAB — TRIGLYCERIDES: Triglycerides: 55 mg/dL (ref <150)

## 2010-04-05 LAB — CHOLESTEROL, TOTAL: Cholesterol: 96 mg/dL (ref 0–200)

## 2010-04-05 LAB — PREALBUMIN: Prealbumin: 5 mg/dL — ABNORMAL LOW (ref 17.0–34.0)

## 2010-04-07 ENCOUNTER — Inpatient Hospital Stay (HOSPITAL_COMMUNITY)
Admission: RE | Admit: 2010-04-07 | Discharge: 2010-04-13 | Payer: Self-pay | Attending: Physical Medicine & Rehabilitation | Admitting: Physical Medicine & Rehabilitation

## 2010-04-07 ENCOUNTER — Encounter (INDEPENDENT_AMBULATORY_CARE_PROVIDER_SITE_OTHER): Payer: Self-pay | Admitting: *Deleted

## 2010-04-07 LAB — CBC
HCT: 30.6 % — ABNORMAL LOW (ref 36.0–46.0)
HCT: 30.7 % — ABNORMAL LOW (ref 36.0–46.0)
Hemoglobin: 10.2 g/dL — ABNORMAL LOW (ref 12.0–15.0)
Hemoglobin: 10.1 g/dL — ABNORMAL LOW (ref 12.0–15.0)
MCH: 32.1 pg (ref 26.0–34.0)
MCH: 31.6 pg (ref 26.0–34.0)
MCHC: 33.3 g/dL (ref 30.0–36.0)
MCHC: 32.9 g/dL (ref 30.0–36.0)
MCV: 96.2 fL (ref 78.0–100.0)
MCV: 95.9 fL (ref 78.0–100.0)
Platelets: 220 10*3/uL (ref 150–400)
Platelets: 251 10*3/uL (ref 150–400)
RBC: 3.18 MIL/uL — ABNORMAL LOW (ref 3.87–5.11)
RBC: 3.2 MIL/uL — ABNORMAL LOW (ref 3.87–5.11)
RDW: 15.7 % — ABNORMAL HIGH (ref 11.5–15.5)
RDW: 15.7 % — ABNORMAL HIGH (ref 11.5–15.5)
WBC: 6.8 10*3/uL (ref 4.0–10.5)
WBC: 6.7 10*3/uL (ref 4.0–10.5)

## 2010-04-07 LAB — GLUCOSE, CAPILLARY
Glucose-Capillary: 101 mg/dL — ABNORMAL HIGH (ref 70–99)
Glucose-Capillary: 62 mg/dL — ABNORMAL LOW (ref 70–99)
Glucose-Capillary: 79 mg/dL (ref 70–99)
Glucose-Capillary: 86 mg/dL (ref 70–99)
Glucose-Capillary: 88 mg/dL (ref 70–99)

## 2010-04-07 LAB — DIFFERENTIAL
Basophils Absolute: 0 10*3/uL (ref 0.0–0.1)
Basophils Relative: 0 % (ref 0–1)
Eosinophils Absolute: 0.2 10*3/uL (ref 0.0–0.7)
Eosinophils Relative: 3 % (ref 0–5)
Lymphocytes Relative: 22 % (ref 12–46)
Lymphs Abs: 1.5 10*3/uL (ref 0.7–4.0)
Monocytes Absolute: 0.7 10*3/uL (ref 0.1–1.0)
Monocytes Relative: 10 % (ref 3–12)
Neutro Abs: 4.4 10*3/uL (ref 1.7–7.7)
Neutrophils Relative %: 65 % (ref 43–77)

## 2010-04-07 LAB — PROTIME-INR
INR: 2.27 — ABNORMAL HIGH (ref 0.00–1.49)
INR: 2.43 — ABNORMAL HIGH (ref 0.00–1.49)
Prothrombin Time: 25.2 seconds — ABNORMAL HIGH (ref 11.6–15.2)
Prothrombin Time: 26.5 s — ABNORMAL HIGH (ref 11.6–15.2)

## 2010-04-07 LAB — CHOLESTEROL, TOTAL: Cholesterol: 134 mg/dL (ref 0–200)

## 2010-04-07 LAB — PHOSPHORUS: Phosphorus: 3.5 mg/dL (ref 2.3–4.6)

## 2010-04-07 LAB — BASIC METABOLIC PANEL
BUN: 10 mg/dL (ref 6–23)
CO2: 29 mEq/L (ref 19–32)
Calcium: 7.8 mg/dL — ABNORMAL LOW (ref 8.4–10.5)
Chloride: 102 mEq/L (ref 96–112)
Creatinine, Ser: 1.14 mg/dL (ref 0.4–1.2)
GFR calc Af Amer: 59 mL/min — ABNORMAL LOW (ref >60)
GFR calc non Af Amer: 49 mL/min — ABNORMAL LOW (ref >60)
Glucose, Bld: 87 mg/dL (ref 70–99)
Potassium: 3.8 mEq/L (ref 3.5–5.1)
Sodium: 137 mEq/L (ref 135–145)

## 2010-04-07 LAB — TRIGLYCERIDES: Triglycerides: 96 mg/dL (ref <150)

## 2010-04-07 LAB — MAGNESIUM: Magnesium: 1.8 mg/dL (ref 1.5–2.5)

## 2010-04-07 LAB — PREALBUMIN: Prealbumin: 15 mg/dL — ABNORMAL LOW (ref 17.0–34.0)

## 2010-04-09 NOTE — H&P (Signed)
NAMEPRESTON, WEILL NO.:  [id number]  MEDICAL RECORD NO.:  [id number]           PATIENT TYPE:  LOCATION:                                 FACILITY:  PHYSICIAN:  Ranelle Oyster, M.D.DATE OF BIRTH:  Jul 19, 1950  DATE OF ADMISSION:  04/07/2010 DATE OF DISCHARGE:                             HISTORY & PHYSICAL   PRIMARY CARE PHYSICIAN:  Merlene Laughter. Renae Gloss, MD  CARDIOLOGIST:  Colleen Can. Deborah Chalk, MD  SURGEON:  Sandria Bales. Ezzard Standing, MD  CHIEF COMPLAINT:  Weakness.  HISTORY OF PRESENT ILLNESS:  This is a pleasant 60 year old black female well-known to [place] with recent back surgery and was doing well at home this December.  She was admitted at [facility], however, on December 31 with nausea, vomiting, diarrhea x2 days.  CT of the abdomen and pelvis showed a normal appearance of proximal sigmoid colon.  GI saw the patient and the patient underwent a colonoscopy showing proximal sigmoid obstruction.  Underwent laparoscopic colon resection and converted to open laparotomy with sigmoid colectomy and transverse colostomy on January 5 by Dr. Ezzard Standing.  Chronic Coumadin was resumed postoperatively.  Initially she was on TNA for nutrition and diet was advanced to regular.  She had some orthostasis requiring hydration.  Her lisinopril was stopped due to decreased blood pressure.  She continues on a back brace when out of bed, although back pain is much improved from prior surgery.  I saw the patient for rehab evaluation 2 days ago and I felt that she could benefit from inpatient stay.  REVIEW OF SYSTEMS:  Notable for low back pain, some loose stool, intermittent nausea.  Full 12 point review is in the written H&P.  PAST MEDICAL HISTORY: 1. Positive for CAD, ischemic cardiomyopathy, ejection fraction 25%.     She has an AICD in place. 2. CAF. 3. Hypothyroidism. 4. GI bleed. 5. Hyperlipidemia. 6. TIA with CVA with residual. 7. Total knee replacement. 8. Multiple  back surgeries. 9. Remote tobacco abuse.  The patient denies alcohol use.  FAMILY HISTORY:  Positive for CAD.  SOCIAL HISTORY:  The patient lives alone, receiving home health PT, OT, and aid after back surgery.  Brother is in the area, can assist with that 24 hours a day.  ALLERGIES:  PENICILLIN, CLEOCIN, STATINS, and PREVACID.  HOME MEDICATIONS AND LABS:  Please see written H&P.  Her recent INR is 2.5.  Prealbumin is 15 yesterday.  PHYSICAL EXAMINATION:  VITAL SIGNS:  Blood pressure 118/74, pulse 74, respiratory rate 18.  She is satting 100% on room air. GENERAL:  The patient is pleasant, alert, and oriented x3.  Affect is generally bright and appropriate. EAR, NOSE AND THROAT:  Unremarkable with good dentition.  Pink moist mucosa. NECK:  Supple without JVD or lymphadenopathy. CHEST:  Clear to auscultation bilaterally without wheezes, rales, or rhonchi. HEART:  Regular rate and rhythm without murmur, rub, or gallops. EXTREMITIES:  No clubbing, cyanosis, or edema. ABDOMEN:  Notable for ostomy and surgical site which is clean and intact.  Ostomy seems to be functioning and well sealed.  SKIN: Otherwise was intact up and  down.  Back incision is well-healed. NEUROLOGICAL:  Cranial nerves II-XII are grossly intact.  Reflexes 1+. Sensation is normal.  Strength in upper extremities is 4/5 proximal distal.  Lower extremity strength is 3+ at hips, 4- at the knees, and to 4-5/5 at both ankles today.  She had some pain inhibition still with proximal movement.  POST ADMISSION PHYSICIAN EVALUATION: 1. Functional deficit secondary to deconditioning related to sigmoid     obstruction and colectomy with ostomy placement.  The patient with     recent back surgery. 2. The patient is admitted to receive collaborative interdisciplinary     care between the physiatrist rehab, nursing staff, and therapy     team. 3. The patient's level of medical complexity and substantial therapy     needs in  context of medical status necessity cannot be provided at     lesser intensity of care. 4. The patient has experienced substantial functional loss from her     baseline.  Premorbidly, she had a modified independent level,     occasional supervision at home.  Currently she is min assist bed     mobility, min assist gait 20 feet rolling walker, max assist ADLs,     especially for lower body.  Judging by the patient's diagnosis,     physical exam and functional history, she has potential for     functional progress which will result in measurable gains while     inpatient rehab.  The gains will be of substantial and practical     use upon discharge to home in facilitating mobility and self-care. 5. Physiatrist will provide 24-hour management of medical needs as     well as oversight of therapy plan/treatment and provide guidance as     appropriate regarding interaction of the two.  Medical problem list     and plan are below. 6. A 24-hour rehab nursing team will assist in management of the     patient's bowel and bladder function, ostomy education, integration     of therapy concept, techniques, wound care, etc. 7. PT will assess and treat for lower extremity strength, range of     motion, endurance, balance training, and goals modified     independent. 8. OT will assess and treat for upper extremities, ADLs, adaptive     techniques, equipment, functional mobility with goals modified     independent as well. 9. Case management and social worker will assess and treat for     psychosocial issues, discharge planning. 10.Team conference will be held weekly to assess progress towards     goals and to determine barriers at discharge. 11.The patient has demonstrated sufficient medical stability exercise     capacity to tolerate at least 3 hours of therapy per day at least 5     days week. 12.Estimated length of stay is approximately 10 days.  Prognosis is     good.  MEDICAL PROBLEM LIST AND  PLAN: 1. DVT prophylaxis with Coumadin.  She is also on Coumadin for AFib.     Recent INR 2.5.  Followup for bleeding complications and INRs will     be checked serially. 2. Pain management with Robaxin and Vicodin.  She seems to be doing     much better in this department.  We will follow clinically for now,     especially with increased activity. 3. Recent lumbar decompressive laminectomy in November:  Continue back     brace when out of bed  per neurosurgery recommendations. 4. History of CAD and ischemic cardiomyopathy.  Check Is and Os as     well as weights.  Continue Coreg and Lasix as previously ordered.     Check electrolytes on admission. 5. History of GI bleed:  Pepcid. 6. Nutrition:  Continue boost supplement and push p.o.  Most recent     prealbumin was low and she needs improvement in this area.     Ranelle Oyster, M.D.     ZTS/MEDQ  D:  04/07/2010  T:  04/07/2010  Job:  [id number]  cc:   Merlene Laughter. Renae Gloss, M.D. Colleen Can. Deborah Chalk, M.D. Sandria Bales. Ezzard Standing, M.D.  Electronically Signed by Faith Rogue M.D. on 04/09/2010 10:02:13 AM

## 2010-04-12 LAB — COMPREHENSIVE METABOLIC PANEL
ALT: 19 U/L (ref 0–35)
Alkaline Phosphatase: 66 U/L (ref 39–117)
CO2: 27 mEq/L (ref 19–32)
Chloride: 98 mEq/L (ref 96–112)
GFR calc non Af Amer: 46 mL/min — ABNORMAL LOW (ref >60)
Glucose, Bld: 109 mg/dL — ABNORMAL HIGH (ref 70–99)
Potassium: 3.7 mEq/L (ref 3.5–5.1)
Sodium: 135 mEq/L (ref 135–145)
Total Bilirubin: 0.7 mg/dL (ref 0.3–1.2)

## 2010-04-12 LAB — CBC
HCT: 32.5 % — ABNORMAL LOW (ref 36.0–46.0)
HCT: 34.6 % — ABNORMAL LOW (ref 36.0–46.0)
Hemoglobin: 10.8 g/dL — ABNORMAL LOW (ref 12.0–15.0)
MCH: 31.8 pg (ref 26.0–34.0)
MCHC: 33.2 g/dL (ref 30.0–36.0)
MCV: 95.6 fL (ref 78.0–100.0)
MCV: 93.5 fL (ref 78.0–100.0)
Platelets: 256 10*3/uL (ref 150–400)
RBC: 3.4 MIL/uL — ABNORMAL LOW (ref 3.87–5.11)
RDW: 15.8 % — ABNORMAL HIGH (ref 11.5–15.5)
RDW: 15.6 % — ABNORMAL HIGH (ref 11.5–15.5)
WBC: 8.1 10*3/uL (ref 4.0–10.5)
WBC: 8.4 10*3/uL (ref 4.0–10.5)

## 2010-04-12 LAB — PROTIME-INR
INR: 2.5 — ABNORMAL HIGH (ref 0.00–1.49)
INR: 2.44 — ABNORMAL HIGH (ref 0.00–1.49)
Prothrombin Time: 27.1 s — ABNORMAL HIGH (ref 11.6–15.2)
Prothrombin Time: 26.6 s — ABNORMAL HIGH (ref 11.6–15.2)

## 2010-04-12 LAB — DIFFERENTIAL
Basophils Absolute: 0 10*3/uL (ref 0.0–0.1)
Basophils Relative: 0 % (ref 0–1)
Eosinophils Absolute: 0.2 10*3/uL (ref 0.0–0.7)
Eosinophils Relative: 2 % (ref 0–5)
Lymphocytes Relative: 22 % (ref 12–46)
Lymphs Abs: 1.9 10*3/uL (ref 0.7–4.0)
Monocytes Absolute: 1 10*3/uL (ref 0.1–1.0)
Monocytes Relative: 12 % (ref 3–12)
Neutro Abs: 5.3 10*3/uL (ref 1.7–7.7)
Neutrophils Relative %: 63 % (ref 43–77)

## 2010-04-12 LAB — MRSA PCR SCREENING

## 2010-04-15 NOTE — Discharge Summary (Addendum)
NAMEMADISYN, MAWHINNEY NO.:  [id number]  MEDICAL RECORD NO.:  [id number]           PATIENT TYPE:  LOCATION:                                 FACILITY:  PHYSICIAN:  Altha Harm, MDDATE OF BIRTH:  12/26/1950  DATE OF ADMISSION:  03/21/2010 DATE OF DISCHARGE:                              DISCHARGE SUMMARY   DATE OF DISCHARGE ANTICIPATED:  April 07, 2010  DISCHARGE DISPOSITION:  Inpatient rehab.  PRIMARY CARE PHYSICIAN:  Merlene Laughter. Renae Gloss, MD  PRIMARY GASTROENTEROLOGIST:  Willis Modena, MD  CARDIOLOGIST:  Colleen Can. Deborah Chalk, MD  FINAL DISCHARGE DIAGNOSES: 1. 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 2. Status post colon resection with colostomy secondary to sigmoid     stricture. 3. Atrial fibrillation. 4. Acute on chronic systolic heart failure, resolved. 5. Hypotension, improved. 6. Ischemic cardiomyopathy, status post automatic implantable     cardioverter-defibrillator. 7. Hypothyroidism. 8. Malnutrition. 9. Chronic anticoagulation secondary to atrial fibrillation, apical     thrombosis. 10.Deconditioned. 11.Anemia, status post transfusion of 1 unit packed red blood cells.  DISCHARGE MEDICATIONS:  Include the following: 1. Lasix 40 mg p.o. b.i.d. 2. Warfarin to be titrated by Pharmacy at rehab. 3. Arthrotec 75/200 one tablet p.o. b.i.d. 4. Coreg CR 20 mg p.o. daily. 5. Flonase nasal spray, 1 spray intranasally daily. 6. Foltx 1 tablet p.o. daily. 7. Fenofexadine 180 mg p.o. daily. 8. Fish oil 1000 mg p.o. daily. 9. Norco 5/325 one to two tablets p.o. q.6 h. p.r.n. pain. 10.Nitroglycerin sublingual 0.4 mg q.5 minutes as needed up to 3     doses. 11.Potassium chloride 20 mEq p.o. daily. 12.Protonix 40 mg p.o. daily. 13.Robaxin 750 mg p.o. q.6 h. p.r.n. spasm. 14.Synthroid 175 mg p.o. daily. 15.Zetia 10 mg p.o. daily.  DISCONTINUED MEDICATIONS:  Include lisinopril, metolazone, and Lasix 40 mg daily.  CONSULTANTS: 1.  [facility]. 2. Dr. Evette Cristal, Gastroenterology. 3. Dr. Ezzard Standing, Surgery.  PROCEDURES: 1. Status post open laparotomy with sigmoid left colectomy,     mobilization of splenic flexure, and transverse colostomy. 2. PICC line placement.  DIAGNOSTIC STUDIES: 1. CT abdomen and pelvis on admission which showed abnormal appearance     of the proximal sigmoid colon with diffuse wall thickening and     dilatation of the proximal colon with air-fluid levels.  This is     concerning for malignancy with proximal obstruction.  No definite     adenopathy seen.  Diverticulosis of the colon is possible, but felt     to be less likely.  There is diverticulosis of the left colon     without acute diverticulitis.  Impression, postsurgical changes in     the lumbosacral spine. 2. Chest x-ray, which shows vascular congestion and mild cardiomegaly     with suggestion of increased interstitial markings raising question     for mild interstitial edema. 3. Two-view chest x-ray on January 13th shows small bilateral pleural     effusions.  Impression, cardiomegaly without edema.  CHIEF COMPLAINT:  Abdominal pain, vomiting and diarrhea for 2 days.  HISTORY OF PRESENT ILLNESS:  Please refer to the H and  P by Dr. Orvan Falconer for details of the HPI; however, in short this is a 60 year old female with a history of diverticulosis who presents to the emergency room with complaints of abdominal pain, vomiting, and diarrhea.  HOSPITAL COURSE: 1. Bowel obstruction.  The patient was admitted with a bowel     obstruction.  This turned out to be a sigmoid stricture, which did     not resolve with conservative treatment.  Gastroenterology was     consulted and saw the patient in consultation.  On further     evaluation, they felt that the patient might require surgical     intervention.  General Surgery was consulted and they agreed that     the patient would need surgical intervention, which resulted in     partial  colectomy with colostomy.  The patient did very well with     her surgery and has had her diet advanced to a regular diet.  Prior     to the surgery, the patient was started on TNA due to her     malnourished state and recent lumbosacral surgery, which also left     her in a relatively malnourished state.  The patient was weaned off     TNA when she started her diet.  Presently, the patient is     tolerating diet.  Her colostomy is working well and she has got     good output. 2. Ischemic cardiomyopathy with chronic congestive heart failure and     with an acute component in the hospital.  The patient was receiving     TNA, which caused her to become fluid overloaded.  The patient was     essentially thought to have diarrhea because her oral intake was     very low.  This rendered the patient fluid overloaded.  The patient     was then diuresed aggressively.  This led to hypotension which     required very transient pressor support.  With rehydration, the     patient recovered her blood pressure, was weaned off pressors and     is now on Lasix on a b.i.d. basis as noted above.  The patient had     been on metolazone before, this was discontinued.  Additionally,     the patient had been on lisinopril and her cardiologist also     discontinued this as he felt that the antihypertensive effects of     lisinopril contributed to the patient's hypertensive state. 3. Hypothyroidism.  The patient is well supplemented on the Synthroid     and should continue. 4. Deconditioned.  The patient is severely deconditioned, was seen by     Physical Therapy and Occupational Therapy; both of whom recommended     an inpatient rehab.  The patient is scheduled to be discharged to     inpatient rehab tomorrow. 5. Chronic anticoagulation.  The patient has been chronically     anticoagulated owing to her multiple heart problems including her     atrial fibrillation and possibly apical thrombus.  The patient      presently is subtherapeutic; however, she will continue to be     titrated on her Coumadin by the pharmacist over at inpatient rehab. 6. Hyperglycemia.  The patient was hyperglycemic likely because of her     TNA and she was followed with correction dose insulin. 7. Ischemic cardiomyopathy, see above. 8. Status post lumbar laminectomy.  The patient is status post  lumbar     laminectomy in November 2011.  She has done well and will continue     her rehab to that end.  DIETARY RESTRICTIONS:  The patient should be on a heart-healthy diet.  PHYSICAL RESTRICTIONS:  Under the care of Physical Therapy.  PHYSICAL EXAMINATION ON DISCHARGE:  Will be dictated by the discharging physician.     Altha Harm, MD     MAM/MEDQ  D:  04/06/2010  T:  04/06/2010  Job:  [id number]  cc:   Merlene Laughter. Renae Gloss, M.D. Fax: [phone]  Willis Modena, MD Fax: [phone]  Colleen Can. Deborah Chalk, M.D. Fax: [phone]  Electronically Signed by Marthann Schiller MD on 04/15/2010 12:04:14 PM

## 2010-04-20 NOTE — Progress Notes (Signed)
    Colorectal Screening:  Colonoscopy Results:    Date of Exam: 12/29/2009    Results: Adenomatous Polyp  PAP Screening:    Last PAP smear:  10/19/2008  Mammogram Screening:    Last Mammogram:  03/21/2009  Osteoporosis Risk Assessment:  Risk Factors for Fracture or Low Bone Density:   Smoking status:       quit   Thyroid replacement:     yes   Thyroid disease:     yes  Immunization & Chemoprophylaxis:    Tetanus vaccine: Tdap  (11/20/2009)    Influenza vaccine: Fluvax 3+  (11/20/2009)

## 2010-04-20 NOTE — Letter (Signed)
Summary: Results Follow-up Letter  [facility]  [address] [place], [place] [address]   Phone: [phone]  Fax: [phone]    11/20/2009  [address] [place], [place]  [address]  Dear Ms. Burkman,   The following are the results of your recent test(s):  Test     Result     Blood sugar     high Thyroid dose   too low CBC       normal Liver/kidney   normal  _________________________________________________________  Please call for an appointment soon, you need to increase your thyroid dose _________________________________________________________ _________________________________________________________ _________________________________________________________  Sincerely,  Sanda Linger MD  [facility]

## 2010-04-20 NOTE — Letter (Signed)
Summary: [facility]  [facility]   Imported By: Sherian Rein 02/05/2010 14:11:51  _____________________________________________________________________  External Attachment:    Type:   Image     Comment:   External Document

## 2010-04-20 NOTE — Procedures (Signed)
Summary: Colonoscopy / [facility]  Colonoscopy / [facility]   Imported By: Lennie Odor 01/15/2010 11:19:58  _____________________________________________________________________  External Attachment:    Type:   Image     Comment:   External Document

## 2010-04-20 NOTE — Miscellaneous (Signed)
  Clinical Lists Changes  Observations: Added new observation of IDC REFER MD: Duffy Rhody TENNANT (12/08/2009 8:21) Added new observation of ICDLEADSTAT1: active (12/08/2009 8:21) Added new observation of ICDLEADSER1: 283151  (12/08/2009 8:21) Added new observation of ICDLEADMOD1: 0185  (12/08/2009 8:21) Added new observation of ICDLEADDOI1: 11/05/2004  (12/08/2009 8:21) Added new observation of ICDLEADLOC1: RV  (12/08/2009 8:21) Added new observation of ICD IMP MD: Sherryl Manges, MD  (12/08/2009 8:21) Added new observation of ICD IMPL DTE: 11/05/2004  (12/08/2009 8:21) Added new observation of ICD SERL#: [id number]  (12/08/2009 8:21) Added new observation of ICD MODL#: T177  (12/08/2009 8:21) Added new observation of ICDMANUFACTR: [employer]  (12/08/2009 8:21) Added new observation of ICD MD: Sherryl Manges, MD  (12/08/2009 8:21)       ICD Specifications Following MD:  Sherryl Manges, MD     Referring MD:  Roger Shelter ICD Vendor:  [facility] [employer]     ICD Model Number:  [id number]     ICD Serial Number:  [id number] ICD DOI:  11/05/2004     ICD Implanting MD:  Sherryl Manges, MD  Lead 1:    Location: RV     DOI: 11/05/2004     Model #: [id number]     Serial #: [id number]     Status: active

## 2010-04-20 NOTE — Letter (Signed)
Summary: Surgical Clearance/[facility]  Surgical Clearance/[facility]   Imported By: Sherian Rein 02/05/2010 14:14:19  _____________________________________________________________________  External Attachment:    Type:   Image     Comment:   External Document

## 2010-04-20 NOTE — Letter (Signed)
Summary: [facility]   Imported By: Sherian Rein 12/21/2009 08:01:37  _____________________________________________________________________  External Attachment:    Type:   Image     Comment:   External Document

## 2010-04-20 NOTE — Assessment & Plan Note (Signed)
Summary: FU PER LETTER/NWS   Vital Signs:  Patient profile:   60 year old female Height:      66 inches Weight:      148.25 pounds BMI:     24.01 O2 Sat:      98 % on Room air Temp:     97.1 degrees F oral Pulse rate:   71 / minute Pulse rhythm:   regular Resp:     16 per minute BP sitting:   102 / 68  (left arm) Cuff size:   large  Vitals Entered By: Rock Nephew CMA (December 02, 2009 1:50 PM)  O2 Flow:  Room air CC: follow-up visit//discuss labs, Fatigue  Does patient need assistance? Functional Status Self care Ambulation Normal   Primary Care Provider:  Etta Grandchild MD  CC:  follow-up visit//discuss labs and Fatigue.  History of Present Illness:  Fatigue      This is a 60 year old woman who presents with Fatigue.  The patient reports primarily physical fatigue, but denies fatigue with minimal exertion.  The patient denies fever, night sweats, weight loss, exertional chest pain, dyspnea, cough, hemoptysis, and new medications.  The patient denies the following symptoms: leg swelling, orthopnea, PND, melena, adenopathy, severe snoring, daytime sleepiness, and skin changes.  The patient denies anhedonia, feeling depressed, altered appetite, and poor sleep.    Preventive Screening-Counseling & Management  Alcohol-Tobacco     Alcohol drinks/day: 1     Alcohol type: all     >5/day in last 3 mos: no     Alcohol Counseling: not indicated; use of alcohol is not excessive or problematic     Feels need to cut down: no     Feels annoyed by complaints: no     Feels guilty re: drinking: no     Needs 'eye opener' in am: no     Smoking Status: quit     Year Quit: 2000     Tobacco Counseling: to remain off tobacco products  Hep-HIV-STD-Contraception     Hepatitis Risk: no risk noted     HIV Risk: no risk noted     STD Risk: no risk noted  Clinical Review Panels:  Prevention   Last Mammogram:  normal (03/21/2009)   Last Pap Smear:  normal (10/19/2008)   Last  Colonoscopy:  Ulceration (11/05/2009)  Immunizations   Last Tetanus Booster:  Tdap (11/20/2009)   Last Flu Vaccine:  Fluvax 3+ (11/20/2009)  Diabetes Management   Creatinine:  1.0 (11/20/2009)   Last Flu Vaccine:  Fluvax 3+ (11/20/2009)  CBC   WBC:  5.4 (11/20/2009)   RBC:  3.50 (11/20/2009)   Hgb:  12.3 (11/20/2009)   Hct:  36.8 (11/20/2009)   Platelets:  155.0 (11/20/2009)   MCV  105.2 (11/20/2009)   MCHC  33.5 (11/20/2009)   RDW  18.2 (11/20/2009)   PMN:  59.1 (11/20/2009)   Lymphs:  25.5 (11/20/2009)   Monos:  11.4 (11/20/2009)   Eosinophils:  3.8 (11/20/2009)   Basophil:  0.2 (11/20/2009)  Complete Metabolic Panel   Glucose:  123 (11/20/2009)   Sodium:  139 (11/20/2009)   Potassium:  4.3 (11/20/2009)   Chloride:  102 (11/20/2009)   CO2:  27 (11/20/2009)   BUN:  15 (11/20/2009)   Creatinine:  1.0 (11/20/2009)   Albumin:  3.9 (11/20/2009)   Total Protein:  6.6 (11/20/2009)   Calcium:  8.7 (11/20/2009)   Total Bili:  0.7 (11/20/2009)   Alk Phos:  38 (11/20/2009)  SGPT (ALT):  18 (11/20/2009)   SGOT (AST):  35 (11/20/2009)   Medications Prior to Update: 1)  Flonase 50 Mcg/act Susp (Fluticasone Propionate) .... As Directed 2)  Coreg Cr 20 Mg Xr24h-Cap (Carvedilol Phosphate) .[name].. 1 By Mouth Qd 3)  Arthrotec 75-200 Mg-Mcg Tabs (Diclofenac-Misoprostol) .[name].. 1 By Mouth Once Daily 4)  Klor-Con 20 Meq Pack (Potassium Chloride) .... As Directed 5)  Lisinopril 2.5 Mg Tabs (Lisinopril) .[name].. 1 By Mouth Bid 6)  Lasix 40 Mg Tabs (Furosemide) .[name].. 1 By Mouth Qd 7)  Allegra 180 Mg Tabs (Fexofenadine Hcl) .[name].. 1 By Mouth Qd 8)  Protonix 40 Mg Tbec (Pantoprazole Sodium) .[name].. 1 By Mouth Qd 9)  Aspir-Low 81 Mg Tbec (Aspirin) .[name].. 1 By Mouth Qd 10)  Coumadin 1 Mg Tabs (Warfarin Sodium) .... 3mg  As Directed 11)  Amiodarone Hcl 200 Mg Tabs (Amiodarone Hcl) .... As Directed 12)  Synthroid 175 Mcg Tabs (Levothyroxine Sodium) .... One By Mouth Once Daily  Current Medications  (verified): 1)  Flonase 50 Mcg/act Susp (Fluticasone Propionate) .... As Directed 2)  Coreg Cr 20 Mg Xr24h-Cap (Carvedilol Phosphate) .[name].. 1 By Mouth Qd 3)  Arthrotec 75-200 Mg-Mcg Tabs (Diclofenac-Misoprostol) .[name].. 1 By Mouth Once Daily 4)  Klor-Con 20 Meq Pack (Potassium Chloride) .... As Directed 5)  Lisinopril 2.5 Mg Tabs (Lisinopril) .[name].. 1 By Mouth Bid 6)  Lasix 40 Mg Tabs (Furosemide) .[name].. 1 By Mouth Qd 7)  Allegra 180 Mg Tabs (Fexofenadine Hcl) .[name].. 1 By Mouth Qd 8)  Protonix 40 Mg Tbec (Pantoprazole Sodium) .[name].. 1 By Mouth Qd 9)  Aspir-Low 81 Mg Tbec (Aspirin) .[name].. 1 By Mouth Qd 10)  Coumadin 1 Mg Tabs (Warfarin Sodium) .... 3mg  As Directed 11)  Amiodarone Hcl 200 Mg Tabs (Amiodarone Hcl) .... As Directed 12)  Synthroid 175 Mcg Tabs (Levothyroxine Sodium) .... One By Mouth Once Daily  Allergies (verified): 1)  ! Pcn 2)  ! * Statins  Past History:  Past Medical History: Last updated: 11/20/2009 Congestive heart failure Coronary artery disease Diverticulitis, hx of Myocardial infarction, hx of Osteoarthritis Cerebrovascular accident, hx of- left eye Hypothyroidism Hyperlipidemia Hypertension  Past Surgical History: Last updated: 11/20/2009 Total knee replacement PTCA/stent Lumbar laminectomy Lumbar fusion  Family History: Last updated: 11/20/2009 Family History of Arthritis Family History Diabetes 1st degree relative Family History of Prostate CA 1st degree relative <50  Social History: Last updated: 11/20/2009 Disabled/retired Married Former Smoker Alcohol use-yes Drug use-no  Risk Factors: Alcohol Use: 1 (12/02/2009) >5 drinks/d w/in last 3 months: no (12/02/2009)  Risk Factors: Smoking Status: quit (12/02/2009)  Family History: Reviewed history from 11/20/2009 and no changes required. Family History of Arthritis Family History Diabetes 1st degree relative Family History of Prostate CA 1st degree relative <50  Social History: Reviewed  history from 11/20/2009 and no changes required. Disabled/retired Married Former Smoker Alcohol use-yes Drug use-no  Review of Systems CV:  Denies chest pain or discomfort, difficulty breathing while lying down, fainting, lightheadness, near fainting, palpitations, shortness of breath with exertion, and swelling of feet. Endo:  Denies cold intolerance, excessive hunger, excessive thirst, excessive urination, heat intolerance, polyuria, and weight change.  Physical Exam  General:  alert, well-developed, well-nourished, well-hydrated, appropriate dress, normal appearance, healthy-appearing, cooperative to examination, and good hygiene.   Head:  normocephalic, atraumatic, no abnormalities observed, and no abnormalities palpated.   Mouth:  Oral mucosa and oropharynx without lesions or exudates.  Teeth in good repair. Neck:  supple, full ROM, no masses, no thyromegaly, no  thyroid nodules or tenderness, no JVD, normal carotid upstroke, no carotid bruits, and no cervical lymphadenopathy.   Lungs:  normal respiratory effort, no intercostal retractions, no accessory muscle use, normal breath sounds, no dullness, no fremitus, no crackles, and no wheezes.   Heart:  normal rate, regular rhythm, no murmur, no gallop, no rub, and no JVD.   Abdomen:  soft, non-tender, normal bowel sounds, no distention, no masses, no guarding, no rigidity, no rebound tenderness, no abdominal hernia, no inguinal hernia, no hepatomegaly, and no splenomegaly.   Msk:  normal ROM, no joint tenderness, no joint swelling, no joint warmth, no redness over joints, no joint deformities, no joint instability, and no crepitation.   Pulses:  R and L carotid,radial,femoral,dorsalis pedis and posterior tibial pulses are full and equal bilaterally Extremities:  trace left pedal edema and trace right pedal edema.   Neurologic:  No cranial nerve deficits noted. Station and gait are normal. Plantar reflexes are down-going bilaterally. DTRs are  symmetrical throughout. Sensory, motor and coordinative functions appear intact. Skin:  turgor normal, color normal, no rashes, no suspicious lesions, no ecchymoses, no petechiae, no purpura, no ulcerations, and no edema.   Cervical Nodes:  no anterior cervical adenopathy and no posterior cervical adenopathy.   Psych:  Cognition and judgment appear intact. Alert and cooperative with normal attention span and concentration. No apparent delusions, illusions, hallucinations   Impression & Recommendations:  Problem # 1:  HYPOTHYROIDISM (ICD-244.9) Assessment Deteriorated she will start the higher dose asap  Her updated medication list for this problem includes:    Synthroid 175 Mcg Tabs (Levothyroxine sodium) ..... One by mouth once daily  Labs Reviewed: TSH: 17.23 (11/20/2009)     Problem # 2:  HYPERTENSION (ICD-401.9) Assessment: Improved  Her updated medication list for this problem includes:    Coreg Cr 20 Mg Xr24h-cap (Carvedilol phosphate) .[name]... 1 by mouth qd    Lisinopril 2.5 Mg Tabs (Lisinopril) .[name]... 1 by mouth bid    Lasix 40 Mg Tabs (Furosemide) .[name]... 1 by mouth qd  BP today: 102/68 Prior BP: 100/60 (11/20/2009)  Labs Reviewed: K+: 4.3 (11/20/2009) Creat: : 1.0 (11/20/2009)     Complete Medication List: 1)  Flonase 50 Mcg/act Susp (Fluticasone propionate) .... As directed 2)  Coreg Cr 20 Mg Xr24h-cap (Carvedilol phosphate) .[name].. 1 by mouth qd 3)  Arthrotec 75-200 Mg-mcg Tabs (Diclofenac-misoprostol) .[name].. 1 by mouth once daily 4)  Klor-con 20 Meq Pack (Potassium chloride) .... As directed 5)  Lisinopril 2.5 Mg Tabs (Lisinopril) .[name].. 1 by mouth bid 6)  Lasix 40 Mg Tabs (Furosemide) .[name].. 1 by mouth qd 7)  Allegra 180 Mg Tabs (Fexofenadine hcl) .[name].. 1 by mouth qd 8)  Protonix 40 Mg Tbec (Pantoprazole sodium) .[name].. 1 by mouth qd 9)  Aspir-low 81 Mg Tbec (Aspirin) .[name].. 1 by mouth qd 10)  Coumadin 1 Mg Tabs (Warfarin sodium) .... 3mg  as directed 11)  Amiodarone Hcl 200 Mg  Tabs (Amiodarone hcl) .... As directed 12)  Synthroid 175 Mcg Tabs (Levothyroxine sodium) .... One by mouth once daily  Patient Instructions: 1)  Please schedule a follow-up appointment in 3 months. Prescriptions: SYNTHROID 175 MCG TABS (LEVOTHYROXINE SODIUM) One by mouth once daily  #90 x 3   Entered and Authorized by:   Etta Grandchild MD   Signed by:   Etta Grandchild MD on 12/02/2009   Method used:   Print then Give to Patient   RxID:   [id number] SYNTHROID 175 MCG TABS (LEVOTHYROXINE  SODIUM) One by mouth once daily  #30 x 11   Entered and Authorized by:   Etta Grandchild MD   Signed by:   Etta Grandchild MD on 12/02/2009   Method used:   Electronically to        [facility] (retail)       [address]       [place], [place]  [address]       Ph: [phone]       Fax: [phone]   RxID:   [id number]

## 2010-04-20 NOTE — Assessment & Plan Note (Signed)
Summary: NEW / MEDICARE/ [name] #   Vital Signs:  Patient profile:   60 year old female Height:      66 inches (167.64 cm) Weight:      149 pounds (67.73 kg) BMI:     24.14 O2 Sat:      99 % on Room air Temp:     97.6 degrees F (36.44 degrees C) oral Pulse rate:   67 / minute Pulse rhythm:   regular Resp:     16 per minute BP sitting:   100 / 60  (left arm) Cuff size:   regular  Vitals Entered By: Lucious Groves CMA (November 20, 2009 1:18 PM)  O2 Flow:  Room air CC: NP to est care./kb Is Patient Diabetic? No Pain Assessment Patient in pain? no        Primary Care Provider:  Etta Grandchild MD  CC:  NP to est care./kb.  History of Present Illness: New to me she needs a new PCP- no complaints today.  Preventive Screening-Counseling & Management  Alcohol-Tobacco     Alcohol drinks/day: 1     Alcohol type: all     >5/day in last 3 mos: no     Alcohol Counseling: not indicated; use of alcohol is not excessive or problematic     Feels need to cut down: no     Feels annoyed by complaints: no     Feels guilty re: drinking: no     Needs 'eye opener' in am: no     Smoking Status: quit     Year Quit: 2000     Tobacco Counseling: to remain off tobacco products  Hep-HIV-STD-Contraception     Hepatitis Risk: no risk noted     HIV Risk: no risk noted     STD Risk: no risk noted      Sexual History:  not active.        Drug Use:  no.        Blood Transfusions:  no.    Current Medications (verified): 1)  Flonase 50 Mcg/act Susp (Fluticasone Propionate) .... As Directed 2)  Coreg Cr 20 Mg Xr24h-Cap (Carvedilol Phosphate) .[name].. 1 By Mouth Qd 3)  Arthrotec 75-200 Mg-Mcg Tabs (Diclofenac-Misoprostol) .[name].. 1 By Mouth Once Daily 4)  Klor-Con 20 Meq Pack (Potassium Chloride) .... As Directed 5)  Lisinopril 2.5 Mg Tabs (Lisinopril) .[name].. 1 By Mouth Bid 6)  Lasix 40 Mg Tabs (Furosemide) .[name].. 1 By Mouth Qd 7)  Allegra 180 Mg Tabs (Fexofenadine Hcl) .[name].. 1 By Mouth Qd 8)   Protonix 40 Mg Tbec (Pantoprazole Sodium) .[name].. 1 By Mouth Qd 9)  Aspir-Low 81 Mg Tbec (Aspirin) .[name].. 1 By Mouth Qd 10)  Coumadin 1 Mg Tabs (Warfarin Sodium) .... 3mg  As Directed 11)  Amiodarone Hcl 200 Mg Tabs (Amiodarone Hcl) .... As Directed 12)  Synthroid 175 Mcg Tabs (Levothyroxine Sodium) .... One By Mouth Once Daily  Allergies (verified): 1)  ! Pcn 2)  ! * Statins  Past History:  Past Medical History: Congestive heart failure Coronary artery disease Diverticulitis, hx of Myocardial infarction, hx of Osteoarthritis Cerebrovascular accident, hx of- left eye Hypothyroidism Hyperlipidemia Hypertension  Past Surgical History: Total knee replacement PTCA/stent Lumbar laminectomy Lumbar fusion  Family History: Family History of Arthritis Family History Diabetes 1st degree relative Family History of Prostate CA 1st degree relative <50  Social History: Disabled/retired Married Former Smoker Alcohol use-yes Drug use-no Smoking Status:  quit Drug Use:  no Hepatitis  Risk:  no risk noted HIV Risk:  no risk noted STD Risk:  no risk noted Sexual History:  not active Blood Transfusions:  no  Review of Systems  The patient denies anorexia, weight loss, weight gain, chest pain, syncope, dyspnea on exertion, peripheral edema, prolonged cough, headaches, hemoptysis, abdominal pain, difficulty walking, and depression.   Endo:  Denies cold intolerance, excessive hunger, excessive thirst, excessive urination, heat intolerance, polyuria, and weight change.  Physical Exam  General:  alert, well-developed, well-nourished, well-hydrated, appropriate dress, normal appearance, healthy-appearing, cooperative to examination, and good hygiene.   Head:  normocephalic, atraumatic, no abnormalities observed, and no abnormalities palpated.   Eyes:  vision grossly intact, pupils equal, pupils round, and pupils reactive to light.   Mouth:  Oral mucosa and oropharynx without lesions or  exudates.  Teeth in good repair. Neck:  supple, full ROM, no masses, no thyromegaly, no thyroid nodules or tenderness, no JVD, normal carotid upstroke, no carotid bruits, and no cervical lymphadenopathy.   Lungs:  normal respiratory effort, no intercostal retractions, no accessory muscle use, normal breath sounds, no dullness, no fremitus, no crackles, and no wheezes.   Heart:  normal rate, regular rhythm, no murmur, no gallop, no rub, and no JVD.   Abdomen:  soft, non-tender, normal bowel sounds, no distention, no masses, no guarding, no rigidity, no rebound tenderness, no abdominal hernia, no inguinal hernia, no hepatomegaly, and no splenomegaly.   Msk:  normal ROM, no joint tenderness, no joint swelling, no joint warmth, no redness over joints, no joint deformities, no joint instability, and no crepitation.   Pulses:  R and L carotid,radial,femoral,dorsalis pedis and posterior tibial pulses are full and equal bilaterally Extremities:  trace left pedal edema and trace right pedal edema.   Neurologic:  No cranial nerve deficits noted. Station and gait are normal. Plantar reflexes are down-going bilaterally. DTRs are symmetrical throughout. Sensory, motor and coordinative functions appear intact. Skin:  turgor normal, color normal, no rashes, no suspicious lesions, no ecchymoses, no petechiae, no purpura, no ulcerations, and no edema.   Cervical Nodes:  no anterior cervical adenopathy and no posterior cervical adenopathy.   Axillary Nodes:  no R axillary adenopathy and no L axillary adenopathy.   Inguinal Nodes:  no R inguinal adenopathy and no L inguinal adenopathy.   Psych:  Cognition and judgment appear intact. Alert and cooperative with normal attention span and concentration. No apparent delusions, illusions, hallucinations   Impression & Recommendations:  Problem # 1:  HYPOTHYROIDISM (ICD-244.9) Assessment Deteriorated  The following medications were removed from the medication list:     Synthroid 125 Mcg Tabs (Levothyroxine sodium) .[name]... 1 by mouth qd Her updated medication list for this problem includes:    Synthroid 175 Mcg Tabs (Levothyroxine sodium) ..... One by mouth once daily  Orders: Venipuncture (44010) TLB-BMP (Basic Metabolic Panel-BMET) (80048-METABOL) TLB-CBC Platelet - w/Differential (85025-CBCD) TLB-Hepatic/Liver Function Pnl (80076-HEPATIC) TLB-TSH (Thyroid Stimulating Hormone) (84443-TSH)  Problem # 2:  HYPERTENSION (ICD-401.9) Assessment: Improved  Her updated medication list for this problem includes:    Coreg Cr 20 Mg Xr24h-cap (Carvedilol phosphate) .[name]... 1 by mouth qd    Lisinopril 2.5 Mg Tabs (Lisinopril) .[name]... 1 by mouth bid    Lasix 40 Mg Tabs (Furosemide) .[name]... 1 by mouth qd  BP today: 100/60  Problem # 3:  HYPERLIPIDEMIA (ICD-272.4) Assessment: Unchanged  Complete Medication List: 1)  Flonase 50 Mcg/act Susp (Fluticasone propionate) .... As directed 2)  Coreg Cr 20 Mg Xr24h-cap (Carvedilol phosphate) .Marland KitchenMarland KitchenMarland Kitchen  1 by mouth qd 3)  Arthrotec 75-200 Mg-mcg Tabs (Diclofenac-misoprostol) .[name].. 1 by mouth once daily 4)  Klor-con 20 Meq Pack (Potassium chloride) .... As directed 5)  Lisinopril 2.5 Mg Tabs (Lisinopril) .[name].. 1 by mouth bid 6)  Lasix 40 Mg Tabs (Furosemide) .[name].. 1 by mouth qd 7)  Allegra 180 Mg Tabs (Fexofenadine hcl) .[name].. 1 by mouth qd 8)  Protonix 40 Mg Tbec (Pantoprazole sodium) .[name].. 1 by mouth qd 9)  Aspir-low 81 Mg Tbec (Aspirin) .[name].. 1 by mouth qd 10)  Coumadin 1 Mg Tabs (Warfarin sodium) .... 3mg  as directed 11)  Amiodarone Hcl 200 Mg Tabs (Amiodarone hcl) .... As directed 12)  Synthroid 175 Mcg Tabs (Levothyroxine sodium) .... One by mouth once daily  Other Orders: Flu Vaccine 67yrs + [id number]) Administration Flu vaccine - MCR (G0008) Tdap => 23yrs IM (60454) Admin 1st Vaccine (09811) Admin of Any Addtl Vaccine (91478)  Colorectal Screening:  Colonoscopy Results:    Date of Exam: 11/05/2009    Results: Ulceration  PAP  Screening:    Last PAP smear:  10/19/2008  Mammogram Screening:    Last Mammogram:  03/21/2009  Osteoporosis Risk Assessment:  Risk Factors for Fracture or Low Bone Density:   Smoking status:       quit   Thyroid replacement:     yes   Thyroid disease:     yes  Immunization & Chemoprophylaxis:    Tetanus vaccine: Tdap  (11/20/2009)    Influenza vaccine: Fluvax 3+  (11/20/2009)  Patient Instructions: 1)  Please schedule a follow-up appointment in 2 months. Prescriptions: SYNTHROID 175 MCG TABS (LEVOTHYROXINE SODIUM) One by mouth once daily  #30 x 11   Entered and Authorized by:   Etta Grandchild MD   Signed by:   Etta Grandchild MD on 11/22/2009   Method used:   Print then Give to Patient   RxID:   [id number]   Preventive Care Screening  Colonoscopy:    Date:  10/19/2009    Results:  abnormal   Mammogram:    Date:  03/21/2009    Results:  normal   Pap Smear:    Date:  10/19/2008    Results:  normal              Flu Vaccine Consent Questions     Do you have a history of severe allergic reactions to this vaccine? no    Any prior history of allergic reactions to egg and/or gelatin? no    Do you have a sensitivity to the preservative Thimersol? no    Do you have a past history of Guillan-Barre Syndrome? no    Do you currently have an acute febrile illness? no    Have you ever had a severe reaction to latex? no    Vaccine information given and explained to patient? yes    Are you currently pregnant? no    Lot Number:[id number]   Exp Date:09/18/2010   Site Given  Right Deltoid IMmedflu   Immunizations Administered:  Tetanus Vaccine:    Vaccine Type: Tdap    Site: left deltoid    Mfr: GlaxoSmithKline    Dose: 0.5 ml    Route: IM    Given by: Lucious Groves CMA    Exp. Date: 01/08/2012    Lot #: [id number]    VIS given: 02/06/08 version given November 20, 2009.

## 2010-04-20 NOTE — Letter (Signed)
Summary: [facility] Cardiology Progress Note   [facility] Cardiology Progress Note   Imported By: Roderic Ovens 12/02/2009 13:39:10  _____________________________________________________________________  External Attachment:    Type:   Image     Comment:   External Document

## 2010-04-20 NOTE — Assessment & Plan Note (Signed)
Summary: WANTS TO BE CK'D BEFORE SURGERY/ REFILLS /NWS  #   Vital Signs:  Patient profile:   60 year old female Height:      66 inches Weight:      152 pounds BMI:     24.62 O2 Sat:      99 % on Room air Temp:     98.8 degrees F oral Pulse rate:   75 / minute Pulse rhythm:   regular Resp:     16 per minute BP sitting:   116 / 80  (left arm) Cuff size:   large  Vitals Entered By: Rock Nephew CMA (February 01, 2010 10:27 AM)  O2 Flow:  Room air CC: follow-up visit//med refills Is Patient Diabetic? No Pain Assessment Patient in pain? no        Primary Care Madaleine Simmon:  Etta Grandchild MD  CC:  follow-up visit//med refills.  History of Present Illness: She returns for f/up and tells me that she has developed foot drop and is having "low back reconstruction" this Friday and that she saw Dr. Rudean Haskell this morning and he ok'ed her for surgery. She tells me that Dr. Rudean Haskell did lab work this morning and that she will have pre-op labs done at the hospital. She feels well today though she is concerned about numbness and weakness in her left foot. She requests refills on several meds today.  Preventive Screening-Counseling & Management  Alcohol-Tobacco     Alcohol drinks/day: 1     Alcohol type: all     >5/day in last 3 mos: no     Alcohol Counseling: not indicated; use of alcohol is not excessive or problematic     Feels need to cut down: no     Feels annoyed by complaints: no     Feels guilty re: drinking: no     Needs 'eye opener' in am: no     Smoking Status: quit     Year Quit: 2000     Tobacco Counseling: to remain off tobacco products  Hep-HIV-STD-Contraception     Hepatitis Risk: no risk noted     HIV Risk: no risk noted     STD Risk: no risk noted      Sexual History:  not active.        Drug Use:  no.        Blood Transfusions:  no.    Clinical Review Panels:  Prevention   Last Mammogram:  normal (03/21/2009)   Last Pap Smear:  normal (10/19/2008)  Last Colonoscopy:  Adenomatous Polyp (12/29/2009)  Immunizations   Last Tetanus Booster:  Tdap (11/20/2009)   Last Flu Vaccine:  Fluvax 3+ (11/20/2009)  Diabetes Management   Creatinine:  1.0 (11/20/2009)   Last Flu Vaccine:  Fluvax 3+ (11/20/2009)  CBC   WBC:  5.4 (11/20/2009)   RBC:  3.50 (11/20/2009)   Hgb:  12.3 (11/20/2009)   Hct:  36.8 (11/20/2009)   Platelets:  155.0 (11/20/2009)   MCV  105.2 (11/20/2009)   MCHC  33.5 (11/20/2009)   RDW  18.2 (11/20/2009)   PMN:  59.1 (11/20/2009)   Lymphs:  25.5 (11/20/2009)   Monos:  11.4 (11/20/2009)   Eosinophils:  3.8 (11/20/2009)   Basophil:  0.2 (11/20/2009)  Complete Metabolic Panel   Glucose:  123 (11/20/2009)   Sodium:  139 (11/20/2009)   Potassium:  4.3 (11/20/2009)   Chloride:  102 (11/20/2009)   CO2:  27 (11/20/2009)   BUN:  15 (11/20/2009)  Creatinine:  1.0 (11/20/2009)   Albumin:  3.9 (11/20/2009)   Total Protein:  6.6 (11/20/2009)   Calcium:  8.7 (11/20/2009)   Total Bili:  0.7 (11/20/2009)   Alk Phos:  38 (11/20/2009)   SGPT (ALT):  18 (11/20/2009)   SGOT (AST):  35 (11/20/2009)   Medications Prior to Update: 1)  Flonase 50 Mcg/act Susp (Fluticasone Propionate) .... As Directed 2)  Coreg Cr 20 Mg Xr24h-Cap (Carvedilol Phosphate) .[name].. 1 By Mouth Qd 3)  Arthrotec 75-200 Mg-Mcg Tabs (Diclofenac-Misoprostol) .[name].. 1 By Mouth Once Daily 4)  Klor-Con 20 Meq Pack (Potassium Chloride) .... As Directed 5)  Lisinopril 2.5 Mg Tabs (Lisinopril) .[name].. 1 By Mouth Bid 6)  Lasix 40 Mg Tabs (Furosemide) .[name].. 1 By Mouth Qd 7)  Allegra 180 Mg Tabs (Fexofenadine Hcl) .[name].. 1 By Mouth Qd 8)  Protonix 40 Mg Tbec (Pantoprazole Sodium) .[name].. 1 By Mouth Qd 9)  Aspir-Low 81 Mg Tbec (Aspirin) .[name].. 1 By Mouth Qd 10)  Coumadin 1 Mg Tabs (Warfarin Sodium) .... 3mg  As Directed 11)  Amiodarone Hcl 200 Mg Tabs (Amiodarone Hcl) .... As Directed 12)  Synthroid 175 Mcg Tabs (Levothyroxine Sodium) .... One By Mouth Once Daily  Current  Medications (verified): 1)  Flonase 50 Mcg/act Susp (Fluticasone Propionate) .... 2 Puffs Each Nostril Once Daily 2)  Coreg Cr 20 Mg Xr24h-Cap (Carvedilol Phosphate) .[name].. 1 By Mouth Qd 3)  Arthrotec 75-200 Mg-Mcg Tabs (Diclofenac-Misoprostol) .[name].. 1 By Mouth Once Daily 4)  Klor-Con 20 Meq Pack (Potassium Chloride) .... As Directed 5)  Lisinopril 2.5 Mg Tabs (Lisinopril) .[name].. 1 By Mouth Bid 6)  Lasix 40 Mg Tabs (Furosemide) .[name].. 1 By Mouth Qd 7)  Allegra 180 Mg Tabs (Fexofenadine Hcl) .[name].. 1 By Mouth Qd 8)  Protonix 40 Mg Tbec (Pantoprazole Sodium) .[name].. 1 By Mouth Qd 9)  Aspir-Low 81 Mg Tbec (Aspirin) .[name].. 1 By Mouth Qd 10)  Coumadin 1 Mg Tabs (Warfarin Sodium) .... 3mg  As Directed 11)  Amiodarone Hcl 200 Mg Tabs (Amiodarone Hcl) .... As Directed 12)  Synthroid 175 Mcg Tabs (Levothyroxine Sodium) .... One By Mouth Once Daily 13)  Metolazone 2.5 Mg Tabs (Metolazone) .... Take 1 Tablet By Mouth Once A Day  Allergies (verified): 1)  ! Pcn 2)  ! * Statins  Past History:  Past Medical History: Last updated: 11/20/2009 Congestive heart failure Coronary artery disease Diverticulitis, hx of Myocardial infarction, hx of Osteoarthritis Cerebrovascular accident, hx of- left eye Hypothyroidism Hyperlipidemia Hypertension  Past Surgical History: Last updated: 11/20/2009 Total knee replacement PTCA/stent Lumbar laminectomy Lumbar fusion  Family History: Last updated: 11/20/2009 Family History of Arthritis Family History Diabetes 1st degree relative Family History of Prostate CA 1st degree relative <50  Social History: Last updated: 11/20/2009 Disabled/retired Married Former Smoker Alcohol use-yes Drug use-no  Risk Factors: Alcohol Use: 1 (02/01/2010) >5 drinks/d w/in last 3 months: no (02/01/2010)  Risk Factors: Smoking Status: quit (02/01/2010)  Family History: Reviewed history from 11/20/2009 and no changes required. Family History of Arthritis Family History  Diabetes 1st degree relative Family History of Prostate CA 1st degree relative <50  Social History: Reviewed history from 11/20/2009 and no changes required. Disabled/retired Married Former Smoker Alcohol use-yes Drug use-no  Review of Systems  The patient denies anorexia, fever, weight loss, weight gain, chest pain, syncope, peripheral edema, prolonged cough, headaches, hemoptysis, abdominal pain, melena, hematochezia, severe indigestion/heartburn, hematuria, suspicious skin lesions, difficulty walking, depression, abnormal bleeding, and enlarged lymph nodes.   Endo:  Denies cold intolerance,  excessive hunger, excessive thirst, excessive urination, heat intolerance, polyuria, and weight change.  Physical Exam  General:  alert, well-developed, well-nourished, well-hydrated, appropriate dress, normal appearance, healthy-appearing, cooperative to examination, and good hygiene.   Mouth:  Oral mucosa and oropharynx without lesions or exudates.  Teeth in good repair. Neck:  supple, full ROM, no masses, no thyromegaly, no thyroid nodules or tenderness, no JVD, normal carotid upstroke, no carotid bruits, and no cervical lymphadenopathy.   Lungs:  normal respiratory effort, no intercostal retractions, no accessory muscle use, normal breath sounds, no dullness, no fremitus, no crackles, and no wheezes.   Heart:  normal rate, regular rhythm, no murmur, no gallop, no rub, and no JVD.   Abdomen:  soft, non-tender, normal bowel sounds, no distention, no masses, no guarding, no rigidity, no rebound tenderness, no abdominal hernia, no inguinal hernia, no hepatomegaly, and no splenomegaly.   Msk:  normal ROM, no joint tenderness, no joint swelling, no joint warmth, no redness over joints, no joint deformities, no joint instability, and no crepitation.   Pulses:  R and L carotid,radial,femoral,dorsalis pedis and posterior tibial pulses are full and equal bilaterally Extremities:  trace left pedal edema and  trace right pedal edema.   Neurologic:  No cranial nerve deficits noted. Station and gait are normal. Plantar reflexes are down-going bilaterally. DTRs are symmetrical throughout. Sensory, motor and coordinative functions appear intact. Skin:  turgor normal, color normal, no rashes, no suspicious lesions, no ecchymoses, no petechiae, no purpura, no ulcerations, and no edema.   Cervical Nodes:  no anterior cervical adenopathy and no posterior cervical adenopathy.   Psych:  Cognition and judgment appear intact. Alert and cooperative with normal attention span and concentration. No apparent delusions, illusions, hallucinations   Impression & Recommendations:  Problem # 1:  HYPERTENSION (ICD-401.9) Assessment Improved  Her updated medication list for this problem includes:    Coreg Cr 20 Mg Xr24h-cap (Carvedilol phosphate) .[name]... 1 by mouth qd    Lisinopril 2.5 Mg Tabs (Lisinopril) .[name]... 1 by mouth bid    Lasix 40 Mg Tabs (Furosemide) .[name]... 1 by mouth qd    Metolazone 2.5 Mg Tabs (Metolazone) .[name]... Take 1 tablet by mouth once a day  BP today: 116/80 Prior BP: 102/68 (12/02/2009)  Labs Reviewed: K+: 4.3 (11/20/2009) Creat: : 1.0 (11/20/2009)     Problem # 2:  HYPOTHYROIDISM (ICD-244.9) Assessment: Unchanged  Her updated medication list for this problem includes:    Synthroid 175 Mcg Tabs (Levothyroxine sodium) ..... One by mouth once daily  Labs Reviewed: TSH: 17.23 (11/20/2009)     Complete Medication List: 1)  Flonase 50 Mcg/act Susp (Fluticasone propionate) .... 2 puffs each nostril once daily 2)  Coreg Cr 20 Mg Xr24h-cap (Carvedilol phosphate) .[name].. 1 by mouth qd 3)  Arthrotec 75-200 Mg-mcg Tabs (Diclofenac-misoprostol) .[name].. 1 by mouth once daily 4)  Klor-con 20 Meq Pack (Potassium chloride) .... As directed 5)  Lisinopril 2.5 Mg Tabs (Lisinopril) .[name].. 1 by mouth bid 6)  Lasix 40 Mg Tabs (Furosemide) .[name].. 1 by mouth qd 7)  Allegra 180 Mg Tabs (Fexofenadine hcl) .[name].. 1 by mouth  qd 8)  Protonix 40 Mg Tbec (Pantoprazole sodium) .[name].. 1 by mouth qd 9)  Aspir-low 81 Mg Tbec (Aspirin) .[name].. 1 by mouth qd 10)  Coumadin 1 Mg Tabs (Warfarin sodium) .... 3mg  as directed 11)  Amiodarone Hcl 200 Mg Tabs (Amiodarone hcl) .... As directed 12)  Synthroid 175 Mcg Tabs (Levothyroxine sodium) .... One by mouth once daily 13)  Metolazone 2.5 Mg Tabs (Metolazone) .... Take 1 tablet by mouth once a day  Patient Instructions: 1)  Please schedule a follow-up appointment in 2 months. 2)  Check your Blood Pressure regularly. If it is above 130/80: you should make an appointment. Prescriptions: METOLAZONE 2.5 MG TABS (METOLAZONE) Take 1 tablet by mouth once a day  #90 x 3   Entered and Authorized by:   Etta Grandchild MD   Signed by:   Etta Grandchild MD on 02/01/2010   Method used:   Printed then faxed to ...         RxID:   [id number] PROTONIX 40 MG TBEC (PANTOPRAZOLE SODIUM) 1 by mouth qd  #90 x 3   Entered and Authorized by:   Etta Grandchild MD   Signed by:   Etta Grandchild MD on 02/01/2010   Method used:   Printed then faxed to ...         RxID:   [id number] ALLEGRA 180 MG TABS (FEXOFENADINE HCL) 1 by mouth qd  #90 x 3   Entered and Authorized by:   Etta Grandchild MD   Signed by:   Etta Grandchild MD on 02/01/2010   Method used:   Printed then faxed to ...         RxID:   [id number] COREG CR 20 MG XR24H-CAP (CARVEDILOL PHOSPHATE) 1 by mouth qd  #90 x 3   Entered and Authorized by:   Etta Grandchild MD   Signed by:   Etta Grandchild MD on 02/01/2010   Method used:   Printed then faxed to ...         RxID:   [id number] FLONASE 50 MCG/ACT SUSP (FLUTICASONE PROPIONATE) 2 puffs each nostril once daily  #90 day sup x 3   Entered and Authorized by:   Etta Grandchild MD   Signed by:   Etta Grandchild MD on 02/01/2010   Method used:   Printed then faxed to ...         RxID:   [id number]    Orders Added: 1)  Est. Patient Level III  [51884]  Appended Document: WANTS TO BE CK'D BEFORE SURGERY/ REFILLS /NWS  # rx faxed to [facility] at [phone]

## 2010-04-20 NOTE — Letter (Signed)
Summary: Appointment - Missed  [facility], Main Office  [address]   [place], [place] [address]   Phone: [phone]  Fax: [phone]     February 24, 2010 MRN: [id number]   [facility] [address] [place], [place]  [address]   Dear Ms. Rigg,  Our records indicate you missed your appointment on 02-15-2010 with the [facility].  It is very important that we reach you to reschedule this appointment. We look forward to participating in your health care needs as we are now taking over the pacemaker checks for Dr. Deborah Chalk. Please contact us at the number listed above at your earliest convenience to reschedule this appointment.     Sincerely,    Glass blower/designer

## 2010-04-20 NOTE — Letter (Signed)
Summary: [facility]  [facility]   Imported By: Lester [place] 01/28/2010 09:51:34  _____________________________________________________________________  External Attachment:    Type:   Image     Comment:   External Document

## 2010-04-22 NOTE — Letter (Signed)
Summary: Device-Delinquent Check  [facility], Main Office  [address]   [place], [place] [address]   Phone: [phone]  Fax: [phone]     April 07, 2010 MRN: [id number]   [facility] [place] [address] [place], [place]  [address]   Dear Ms. Vitale,  According to our records, you have not had your implanted device checked in the recommended period of time.  We are unable to determine appropriate device function without checking your device on a regular basis.  Please call our office to schedule an appointment , with Dr Graciela Husbands, as soon as possible.  If you are having your device checked by another physician, please call us so that we may update our records.  Thank you,  Letta Moynahan, EMT  April 07, 2010 2:44 PM  [facility] certified

## 2010-04-22 NOTE — Consult Note (Signed)
Summary: [facility]   Imported By: Sherian Rein 03/25/2010 08:50:46  _____________________________________________________________________  External Attachment:    Type:   Image     Comment:   External Document

## 2010-04-22 NOTE — Miscellaneous (Signed)
Summary: Patient Discharged / [facility]   Patient Discharged / [facility]   Imported By: Lennie Odor 03/05/2010 11:40:45  _____________________________________________________________________  External Attachment:    Type:   Image     Comment:   External Document

## 2010-04-22 NOTE — Letter (Signed)
Summary: Appointment - Reminder 2  [facility], Main Office  [address]   [place], [place] [address]   Phone: [phone]  Fax: [phone]     March 31, 2010 MRN: [id number]   [facility] [place] [address] [place], [place]  [address]   Dear [name],  Our records indicate that it is past time to schedule a follow-up appointment.  Dr.Tennent recommended that you follow up with [place] in November 2011. It is very important that we reach you to schedule this appointment. We look forward to participating in your health care needs. Please contact us at the number listed above at your earliest convenience to schedule your appointment.  If you are unable to make an appointment at this time, give [place] a call so we can update our records.     Sincerely,   Glass blower/designer

## 2010-05-04 NOTE — Discharge Summary (Signed)
[name], [name] NO.:  [id number]  MEDICAL RECORD NO.:  [id number]          PATIENT TYPE:  IPS  LOCATION:  4004                         FACILITY:  [facility]  PHYSICIAN:  Ranelle Oyster, M.D.DATE OF BIRTH:  [birth year]  DATE OF ADMISSION:  04/07/2010 DATE OF DISCHARGE:  04/13/2010                              DISCHARGE SUMMARY   DISCHARGE DIAGNOSES:  Sigmoid obstruction with colectomy/ostomy March 25, 2009 with deconditioning, chronic Coumadin therapy for atrial fibrillation, pain management, recent redo of lumbar decompressive laminectomy November 2011, coronary artery disease with ischemic cardiomyopathy, hypertension, hypothyroidism, hyperlipidemia, history of gastrointestinal bleed.  This is a 60 year old black female recent redo decompressive laminectomy lumbar L3, 4, 5 and S1  on November 2011, received inpatient rehab services February 16, 2010, to February 26, 2010.  Discharged to home at supervision level.  Also with history of atrial fibrillation, on chronic Coumadin therapy, ischemic cardiomyopathy.  Admitted to [facility] on March 20, 2010, with nausea, vomiting, diarrhea x2 days. CT of abdomen, pelvis showed abnormal appearance of proximal sigmoid colon.  Gastroenterology was consulted, underwent colonoscopy that showed proximal sigmoid obstruction.  Underwent laparoscopic colon resection, converted to open laparotomy with sigmoid colectomy and transverse colon ostomy on March 25, 2009, per Dr. Ezzard Standing.  Chronic Coumadin therapy resumed.  Initially on TNA for nutrition with diet advanced to regular.  Noted orthostasis responded to hydration.  Her lisinopril and Zaroxolyn was discontinued.  She continued with back brace as prior to hospital admission with recent back surgery.  Ostomy teaching was ongoing.  She has minimal assist for mobility.  She was admitted for comprehensive rehab program.  PAST MEDICAL HISTORY:  See discharge  diagnoses.  Remote smoker.  No alcohol.  ALLERGIES:  PENICILLIN, CLEOCIN, STATINS and PREVACID.  SOCIAL HISTORY:  Lives alone, was receiving home health therapies as well as an aide since prior back surgery.  She has a brother in the area to check as needed.  One-level home, three steps to entry.  FUNCTIONAL HISTORY:  Prior to admission was independent with a walker and a back brace.  She does not drive.  FUNCTIONAL STATUS UPON ADMISSION TO REHAB SERVICES:  Minimal assist for bed mobility and transfers, minimal assist to ambulate 20 feet with a rolling walker, max assist for activities of daily living and for lower body dressing.  MEDICATIONS PRIOR TO ADMISSION:  Lisinopril 2.5 mg twice daily, Coumadin daily, fish oil daily, Coreg 20 mg daily, Allegra daily, Synthroid 175 mcg daily, Foltx daily, Robaxin as needed, Zetia 10 mg daily, Lasix 40 mg daily, nitroglycerin as needed, Norco as needed, aspirin 81 mg daily, Protonix 40 mg daily, potassium chloride 20 mEq daily.  PHYSICAL EXAM:  Blood pressure 118/74, pulse 74, temperature 98.2, respirations 18. GENERAL:  This was an alert female in no acute distress, oriented x3. Deep tendon reflexes 2+.  Back incision well healed.  Ostomy in place. Partial sutures. Intact abdomen. Calves remain cool without any swelling, erythema, nontender.  REHABILITATION HOSPITAL COURSE:  The patient was admitted to inpatient rehab services with therapies initiated on a 3-hour daily basis consisting of physical  therapy, occupational therapy and rehabilitation nursing.  The following issues were addressed during the patient's rehabilitation stay.  Pertaining to Ms. Decker' deconditioning after sigmoid obstruction, she had undergone colectomy with ostomy on March 25, 2009.  She would follow up with Dr. Ezzard Standing.  Her remaining staples were to be removed.  She was receiving a 2x2 gauze to control the moist section of abdominal wound.  She remained afebrile.   She remained on chronic Coumadin for atrial fibrillation.  Latest INR of 3.25.  She would follow up with Dr. Roger Shelter of cardiology services for ongoing care of her Coumadin therapy.  Vicodin, Robaxin for pain management with good results.  Recent lumbar redo decompressive laminectomy on January 29, 2010.  Back brace in place.  She would follow up with Neurosurgery as advised.  She had no chest pain or shortness of breath throughout her rehab course.  Her blood pressures were well controlled currently on Coreg and Lasix.  It was noted that her lisinopril and Zaroxolyn had recently discontinued while at [facility] due to some orthostasis.  She will continue her Zetia and fish oil for hyperlipidemia.  She had no bowel or bladder disturbances.  She was receiving ostomy care and teaching.  The patient received weekly collaborative interdisciplinary team conferences to discuss estimated length of stay, family teaching and any barriers to discharge.  She was ambulating extended household distances with a walker, back brace in place, essentially independent for activities of daily living with set up needing some assist for lower body dressing.  Her strength and endurance had greatly improved.  She was encouraged with overall progress.  Plans to be discharge to home with ongoing therapies as dictated per rehab services.  Latest labs showed a hemoglobin of 10.8, hematocrit 37.5, platelet 256,000.  Sodium 135, potassium 3.7, BUN 12, creatinine 1.20.  Latest INR of 3.25.  DISCHARGE MEDICATIONS AT TIME OF DICTATION:  Coumadin 3 mg daily adjusted accordingly for INR of 2.0-3.0, Coreg 20 mg daily, Pepcid 20 mg daily, Lasix 40 mg twice daily, Synthroid 175 mcg daily, potassium chloride 20 mEq daily, Zetia 10 mg daily, Foltx 1 tablet daily, Claritin 10 mg daily, Flonase 1 spray daily, Lovaza 1 g daily, Robaxin 500 mg every 6 hours as needed for spasms, dispensed 90 tablets, Vicodin 5/  325 one or two tablets every 4 hours as needed for pain, dispensed of 90 tablets.  Her diet was regular.  SPECIAL INSTRUCTIONS:  Back brace when out of bed or sitting up.  Home health nurse for Coumadin therapy with results to Dr. Roger Shelter as directed.  Follow up with Dr. Sanda Linger, medical management; Dr. Faith Rogue, the outpatient rehab service office, as needed.     Mariam Dollar, P.A.   ______________________________ Ranelle Oyster, M.D.    DA/MEDQ  D:  04/12/2010  T:  04/13/2010  Job:  [id number]  cc:   Sanda Linger, MD Colleen Can. Deborah Chalk, M.D. Sandria Bales. Ezzard Standing, M.D. Graylin Shiver, M.D.  Electronically Signed by Mariam Dollar P.A. on 04/13/2010 01:31:31 PM Electronically Signed by Faith Rogue M.D. on 05/04/2010 04:30:50 PM

## 2010-05-04 NOTE — Consult Note (Signed)
[name], [name] NO.:  [id number]  MEDICAL RECORD NO.:  [id number]          PATIENT TYPE:  INP  LOCATION:  1503                         FACILITY:  [facility]  PHYSICIAN:  Graylin Shiver, M.D.   DATE OF BIRTH:  [birth year]  DATE OF CONSULTATION:  03/22/2010 DATE OF DISCHARGE:                                CONSULTATION   REASON FOR CONSULTATION:  The patient is a 60 year old female who we are asked to see in regards to a sigmoid stricture.  HISTORY OF PRESENT ILLNESS:  She was admitted to the hospital because of complaints of recurrent abdominal pain which she had several days ago, associated with nausea, vomiting and abdominal distention.  She had been doing reasonably well prior to that.  In reviewing her records, she has a history of ischemic colitis and in August 2011 she underwent a colonoscopy here in the hospital, which showed evidence of ischemic colitis of the descending colon region.  She also had diverticulosis. The patient also gives a history of having had diverticulitis in the past.  Of note is that she also had a colonoscopy at the [facility] done by Dr. Bosie Clos in October 2011.  This colonoscopy was successful to the cecum.  The findings were that of a 6 mm ascending colon polyp, which was removed with hot snare, and 4 small rectal polyps which were biopsied of.  She also had multiple sigmoid diverticula. There was no evidence at that time mentioned of diverticulitis or ischemia or a stricture.  The polyps were not adenomatous.  On December 31, upon admission to this hospital, she had a CT scan which showed proximal sigmoid colon with diffuse wall thickening and dilation of the more proximal colon.  The radiologist reported concern for malignancy but, as mentioned above, no evidence of malignancy was seen on 2 prior colonoscopies; one in August and one in October of last year.  PAST SURGICAL HISTORY:  Lumbar fusion in November  2011, total knee.  PAST MEDICAL HISTORY:  Coronary artery disease, several myocardial infarctions, ischemic cardiomyopathy, CHF, history of TIA, hypertension, hypothyroidism.  MEDICATIONS PRIOR TO ADMISSION:  Aspirin, Arthrotec, Coreg, diclofenac, Flonase, Vicodin, Lasix, lisinopril, Nitro-Bid, potassium chloride, Robaxin, Synthroid, Protonix, metolazone, and Coumadin.  ALLERGIES:  PENICILLIN, CLINDAMYCIN, and STATINS.  PHYSICAL EXAMINATION:  GENERAL:  She is alert.  She is oriented.  She is in no distress.  VITAL SIGNS:  Stable.  HEART:  Regular rhythm.  No murmurs.  LUNGS:  Clear.  ABDOMEN:  Bowel sounds are diminished.  The abdomen is somewhat distended.  She is tender in the lower abdomen on both the right and left side but no rebound.  IMPRESSION AND PLAN:  Proximal sigmoid obstruction.  I believe that this is either due to an ischemic or diverticular stricture.  She is still symptomatic and will likely need a sigmoid colectomy.  I do not think that she needs another colonoscopy.  I do not think that she has a malignancy in this area given the fact that she has had 2 colonoscopies within the last few months with no evidence of malignancy seen in  this area.          ______________________________ Graylin Shiver, M.D.     SFG/MEDQ  D:  03/22/2010  T:  03/22/2010  Job:  [id number]  cc:   [facility] [address] [place], [place] [address]  Shirley Friar, MD Fax: [phone]  Electronically Signed by Herbert Moors MD on 05/04/2010 12:48:05 PM

## 2010-05-14 ENCOUNTER — Encounter: Payer: Self-pay | Admitting: Internal Medicine

## 2010-05-19 ENCOUNTER — Encounter: Payer: Self-pay | Admitting: Internal Medicine

## 2010-05-31 LAB — COMPREHENSIVE METABOLIC PANEL
ALT: 12 U/L (ref 0–35)
AST: 19 U/L (ref 0–37)
Albumin: 3 g/dL — ABNORMAL LOW (ref 3.5–5.2)
Alkaline Phosphatase: 114 U/L (ref 39–117)
Alkaline Phosphatase: 142 U/L — ABNORMAL HIGH (ref 39–117)
BUN: 12 mg/dL (ref 6–23)
BUN: 6 mg/dL (ref 6–23)
CO2: 24 mEq/L (ref 19–32)
CO2: 28 mEq/L (ref 19–32)
Calcium: 8.6 mg/dL (ref 8.4–10.5)
Chloride: 108 mEq/L (ref 96–112)
Chloride: 98 mEq/L (ref 96–112)
Creatinine, Ser: 1.27 mg/dL — ABNORMAL HIGH (ref 0.4–1.2)
Creatinine, Ser: 1.31 mg/dL — ABNORMAL HIGH (ref 0.4–1.2)
GFR calc Af Amer: 50 mL/min — ABNORMAL LOW (ref >60)
GFR calc non Af Amer: 41 mL/min — ABNORMAL LOW (ref >60)
GFR calc non Af Amer: 43 mL/min — ABNORMAL LOW (ref >60)
Glucose, Bld: 77 mg/dL (ref 70–99)
Glucose, Bld: 84 mg/dL (ref 70–99)
Potassium: 3.7 mEq/L (ref 3.5–5.1)
Potassium: 4.2 mEq/L (ref 3.5–5.1)
Potassium: 4.6 mEq/L (ref 3.5–5.1)
Total Bilirubin: 0.7 mg/dL (ref 0.3–1.2)
Total Bilirubin: 0.9 mg/dL (ref 0.3–1.2)
Total Protein: 6 g/dL (ref 6.0–8.3)

## 2010-05-31 LAB — CBC
HCT: 38.8 % (ref 36.0–46.0)
Hemoglobin: 13.6 g/dL (ref 12.0–15.0)
MCH: 33 pg (ref 26.0–34.0)
MCHC: 32.7 g/dL (ref 30.0–36.0)
MCV: 99.5 fL (ref 78.0–100.0)
Platelets: 201 10*3/uL (ref 150–400)
Platelets: 202 10*3/uL (ref 150–400)
RBC: 3.56 MIL/uL — ABNORMAL LOW (ref 3.87–5.11)
RBC: 4.12 MIL/uL (ref 3.87–5.11)
RDW: 16.2 % — ABNORMAL HIGH (ref 11.5–15.5)
RDW: 16.6 % — ABNORMAL HIGH (ref 11.5–15.5)
WBC: 6.8 10*3/uL (ref 4.0–10.5)
WBC: 6.8 10*3/uL (ref 4.0–10.5)
WBC: 9.3 10*3/uL (ref 4.0–10.5)

## 2010-05-31 LAB — PROTIME-INR
INR: 2.32 — ABNORMAL HIGH (ref 0.00–1.49)
INR: 2.5 — ABNORMAL HIGH (ref 0.00–1.49)
INR: 1.15 (ref 0.00–1.49)
INR: 1.4 (ref 0.00–1.49)
INR: 2.18 — ABNORMAL HIGH (ref 0.00–1.49)
INR: 2.93 — ABNORMAL HIGH (ref 0.00–1.49)
Prothrombin Time: 25.6 seconds — ABNORMAL HIGH (ref 11.6–15.2)
Prothrombin Time: 28.8 seconds — ABNORMAL HIGH (ref 11.6–15.2)
Prothrombin Time: 14.9 seconds (ref 11.6–15.2)
Prothrombin Time: 20.4 seconds — ABNORMAL HIGH (ref 11.6–15.2)
Prothrombin Time: 24.4 seconds — ABNORMAL HIGH (ref 11.6–15.2)
Prothrombin Time: 29.6 seconds — ABNORMAL HIGH (ref 11.6–15.2)
Prothrombin Time: 30.6 seconds — ABNORMAL HIGH (ref 11.6–15.2)

## 2010-05-31 LAB — URINALYSIS, ROUTINE W REFLEX MICROSCOPIC
Glucose, UA: NEGATIVE mg/dL
Ketones, ur: 15 mg/dL — AB
Nitrite: NEGATIVE
Protein, ur: 30 mg/dL — AB

## 2010-05-31 LAB — DIFFERENTIAL
Lymphocytes Relative: 10 % — ABNORMAL LOW (ref 12–46)
Lymphs Abs: 0.9 10*3/uL (ref 0.7–4.0)
Monocytes Absolute: 1.3 10*3/uL — ABNORMAL HIGH (ref 0.1–1.0)
Monocytes Relative: 14 % — ABNORMAL HIGH (ref 3–12)
Neutro Abs: 7 10*3/uL (ref 1.7–7.7)
Neutrophils Relative %: 75 % (ref 43–77)

## 2010-05-31 LAB — BASIC METABOLIC PANEL
BUN: 7 mg/dL (ref 6–23)
CO2: 21 mEq/L (ref 19–32)
Calcium: 8.1 mg/dL — ABNORMAL LOW (ref 8.4–10.5)
Creatinine, Ser: 1.23 mg/dL — ABNORMAL HIGH (ref 0.4–1.2)
Creatinine, Ser: 1.21 mg/dL — ABNORMAL HIGH (ref 0.4–1.2)
GFR calc Af Amer: 54 mL/min — ABNORMAL LOW (ref >60)
GFR calc non Af Amer: 45 mL/min — ABNORMAL LOW (ref >60)
GFR calc non Af Amer: 45 mL/min — ABNORMAL LOW (ref >60)
Glucose, Bld: 69 mg/dL — ABNORMAL LOW (ref 70–99)
Glucose, Bld: 82 mg/dL (ref 70–99)
Potassium: 3.8 mEq/L (ref 3.5–5.1)
Sodium: 138 mEq/L (ref 135–145)

## 2010-05-31 LAB — PHOSPHORUS: Phosphorus: 3.2 mg/dL (ref 2.3–4.6)

## 2010-05-31 LAB — URINE CULTURE
Colony Count: NO GROWTH
Culture  Setup Time: 201201011028

## 2010-05-31 LAB — URINE MICROSCOPIC-ADD ON

## 2010-05-31 LAB — MAGNESIUM: Magnesium: 2.1 mg/dL (ref 1.5–2.5)

## 2010-06-01 LAB — PREPARE RBC (CROSSMATCH)

## 2010-06-01 LAB — CBC
HCT: 27.3 % — ABNORMAL LOW (ref 36.0–46.0)
HCT: 27.5 % — ABNORMAL LOW (ref 36.0–46.0)
HCT: 29.8 % — ABNORMAL LOW (ref 36.0–46.0)
Hemoglobin: 10.2 g/dL — ABNORMAL LOW (ref 12.0–15.0)
Hemoglobin: 14.5 g/dL (ref 12.0–15.0)
Hemoglobin: 8.7 g/dL — ABNORMAL LOW (ref 12.0–15.0)
Hemoglobin: 9.4 g/dL — ABNORMAL LOW (ref 12.0–15.0)
Hemoglobin: 9.5 g/dL — ABNORMAL LOW (ref 12.0–15.0)
Hemoglobin: 9.6 g/dL — ABNORMAL LOW (ref 12.0–15.0)
Hemoglobin: 9.8 g/dL — ABNORMAL LOW (ref 12.0–15.0)
MCH: 32.4 pg (ref 26.0–34.0)
MCH: 32.8 pg (ref 26.0–34.0)
MCH: 32.8 pg (ref 26.0–34.0)
MCH: 33 pg (ref 26.0–34.0)
MCH: 33.2 pg (ref 26.0–34.0)
MCH: 33.6 pg (ref 26.0–34.0)
MCH: 33.9 pg (ref 26.0–34.0)
MCH: 34.1 pg — ABNORMAL HIGH (ref 26.0–34.0)
MCHC: 34.2 g/dL (ref 30.0–36.0)
MCHC: 34.4 g/dL (ref 30.0–36.0)
MCHC: 34.5 g/dL (ref 30.0–36.0)
MCHC: 34.8 g/dL (ref 30.0–36.0)
MCV: 100 fL (ref 78.0–100.0)
MCV: 94.8 fL (ref 78.0–100.0)
MCV: 95.3 fL (ref 78.0–100.0)
MCV: 95.9 fL (ref 78.0–100.0)
MCV: 97 fL (ref 78.0–100.0)
MCV: 97.7 fL (ref 78.0–100.0)
Platelets: 109 10*3/uL — ABNORMAL LOW (ref 150–400)
Platelets: 211 10*3/uL (ref 150–400)
Platelets: 79 10*3/uL — ABNORMAL LOW (ref 150–400)
Platelets: 85 10*3/uL — ABNORMAL LOW (ref 150–400)
Platelets: 87 10*3/uL — ABNORMAL LOW (ref 150–400)
Platelets: 97 10*3/uL — ABNORMAL LOW (ref 150–400)
RBC: 2.59 MIL/uL — ABNORMAL LOW (ref 3.87–5.11)
RBC: 2.82 MIL/uL — ABNORMAL LOW (ref 3.87–5.11)
RBC: 2.95 MIL/uL — ABNORMAL LOW (ref 3.87–5.11)
RBC: 2.96 MIL/uL — ABNORMAL LOW (ref 3.87–5.11)
RBC: 4.07 MIL/uL (ref 3.87–5.11)
RDW: 16.4 % — ABNORMAL HIGH (ref 11.5–15.5)
RDW: 16.7 % — ABNORMAL HIGH (ref 11.5–15.5)
RDW: 17.1 % — ABNORMAL HIGH (ref 11.5–15.5)
RDW: 18.5 % — ABNORMAL HIGH (ref 11.5–15.5)
WBC: 6.8 10*3/uL (ref 4.0–10.5)
WBC: 7.4 10*3/uL (ref 4.0–10.5)

## 2010-06-01 LAB — DIFFERENTIAL
Basophils Absolute: 0 10*3/uL (ref 0.0–0.1)
Basophils Absolute: 0 10*3/uL (ref 0.0–0.1)
Eosinophils Absolute: 0.2 10*3/uL (ref 0.0–0.7)
Eosinophils Relative: 2 % (ref 0–5)
Lymphocytes Relative: 20 % (ref 12–46)
Lymphocytes Relative: 24 % (ref 12–46)
Lymphs Abs: 1.5 10*3/uL (ref 0.7–4.0)
Monocytes Absolute: 0.9 10*3/uL (ref 0.1–1.0)
Monocytes Absolute: 1 10*3/uL (ref 0.1–1.0)
Monocytes Relative: 16 % — ABNORMAL HIGH (ref 3–12)
Neutro Abs: 3.7 10*3/uL (ref 1.7–7.7)
Neutrophils Relative %: 59 % (ref 43–77)

## 2010-06-01 LAB — POCT I-STAT 4, (NA,K, GLUC, HGB,HCT)
HCT: 27 % — ABNORMAL LOW (ref 36.0–46.0)
Hemoglobin: 9.2 g/dL — ABNORMAL LOW (ref 12.0–15.0)
Potassium: 3.7 mEq/L (ref 3.5–5.1)
Sodium: 140 mEq/L (ref 135–145)

## 2010-06-01 LAB — BASIC METABOLIC PANEL
BUN: 17 mg/dL (ref 6–23)
BUN: 6 mg/dL (ref 6–23)
CO2: 20 mEq/L (ref 19–32)
CO2: 22 mEq/L (ref 19–32)
CO2: 22 mEq/L (ref 19–32)
CO2: 24 mEq/L (ref 19–32)
CO2: 25 mEq/L (ref 19–32)
CO2: 27 mEq/L (ref 19–32)
Calcium: 7.4 mg/dL — ABNORMAL LOW (ref 8.4–10.5)
Calcium: 8.7 mg/dL (ref 8.4–10.5)
Chloride: 102 mEq/L (ref 96–112)
Chloride: 103 mEq/L (ref 96–112)
Chloride: 106 mEq/L (ref 96–112)
Chloride: 107 mEq/L (ref 96–112)
Chloride: 99 mEq/L (ref 96–112)
Creatinine, Ser: 1.08 mg/dL (ref 0.4–1.2)
Creatinine, Ser: 1.18 mg/dL (ref 0.4–1.2)
Creatinine, Ser: 1.32 mg/dL — ABNORMAL HIGH (ref 0.4–1.2)
GFR calc Af Amer: >60 mL/min (ref >60)
GFR calc Af Amer: >60 mL/min (ref >60)
GFR calc Af Amer: >60 mL/min (ref >60)
GFR calc non Af Amer: 41 mL/min — ABNORMAL LOW (ref >60)
Glucose, Bld: 114 mg/dL — ABNORMAL HIGH (ref 70–99)
Glucose, Bld: 118 mg/dL — ABNORMAL HIGH (ref 70–99)
Glucose, Bld: 99 mg/dL (ref 70–99)
Potassium: 3.9 mEq/L (ref 3.5–5.1)
Potassium: 4 mEq/L (ref 3.5–5.1)
Potassium: 4.2 mEq/L (ref 3.5–5.1)
Sodium: 132 mEq/L — ABNORMAL LOW (ref 135–145)
Sodium: 133 mEq/L — ABNORMAL LOW (ref 135–145)
Sodium: 135 mEq/L (ref 135–145)
Sodium: 139 mEq/L (ref 135–145)

## 2010-06-01 LAB — URINALYSIS, ROUTINE W REFLEX MICROSCOPIC
Hgb urine dipstick: NEGATIVE
Protein, ur: NEGATIVE mg/dL
Urobilinogen, UA: 0.2 mg/dL (ref 0.0–1.0)

## 2010-06-01 LAB — SURGICAL PCR SCREEN
MRSA, PCR: NEGATIVE
Staphylococcus aureus: POSITIVE — AB

## 2010-06-01 LAB — TYPE AND SCREEN
ABO/RH(D): A POS
Unit division: 0

## 2010-06-01 LAB — COMPREHENSIVE METABOLIC PANEL
ALT: 25 U/L (ref 0–35)
AST: 34 U/L (ref 0–37)
Albumin: 2.1 g/dL — ABNORMAL LOW (ref 3.5–5.2)
Albumin: 2.3 g/dL — ABNORMAL LOW (ref 3.5–5.2)
Albumin: 2.8 g/dL — ABNORMAL LOW (ref 3.5–5.2)
BUN: 13 mg/dL (ref 6–23)
BUN: 9 mg/dL (ref 6–23)
Chloride: 106 mEq/L (ref 96–112)
Chloride: 112 mEq/L (ref 96–112)
Creatinine, Ser: 0.93 mg/dL (ref 0.4–1.2)
Creatinine, Ser: 1.13 mg/dL (ref 0.4–1.2)
Creatinine, Ser: 1.44 mg/dL — ABNORMAL HIGH (ref 0.4–1.2)
GFR calc Af Amer: 59 mL/min — ABNORMAL LOW (ref >60)
Glucose, Bld: 155 mg/dL — ABNORMAL HIGH (ref 70–99)
Glucose, Bld: 180 mg/dL — ABNORMAL HIGH (ref 70–99)
Sodium: 131 mEq/L — ABNORMAL LOW (ref 135–145)
Total Bilirubin: 0.6 mg/dL (ref 0.3–1.2)
Total Bilirubin: 1 mg/dL (ref 0.3–1.2)
Total Bilirubin: 1.3 mg/dL — ABNORMAL HIGH (ref 0.3–1.2)
Total Protein: 4 g/dL — ABNORMAL LOW (ref 6.0–8.3)
Total Protein: 5.6 g/dL — ABNORMAL LOW (ref 6.0–8.3)

## 2010-06-01 LAB — CULTURE, BLOOD (ROUTINE X 2)
Culture  Setup Time: 201111230055
Culture: NO GROWTH

## 2010-06-01 LAB — PROTIME-INR
INR: 1.09 (ref 0.00–1.49)
INR: 1.11 (ref 0.00–1.49)
INR: 1.25 (ref 0.00–1.49)
Prothrombin Time: 14.5 seconds (ref 11.6–15.2)

## 2010-06-01 LAB — CARDIAC PANEL(CRET KIN+CKTOT+MB+TROPI)
Relative Index: 1.1 (ref 0.0–2.5)
Troponin I: 0.01 ng/mL (ref 0.00–0.06)

## 2010-06-01 LAB — URINE MICROSCOPIC-ADD ON

## 2010-06-01 LAB — URINE CULTURE
Culture  Setup Time: 201111222045
Culture: NO GROWTH

## 2010-06-01 LAB — APTT
aPTT: 25 seconds (ref 24–37)
aPTT: 29 seconds (ref 24–37)

## 2010-06-01 LAB — LACTIC ACID, PLASMA: Lactic Acid, Venous: 2.1 mmol/L (ref 0.5–2.2)

## 2010-06-01 NOTE — Letter (Signed)
Summary: [facility]   Imported By: Sherian Rein 05/24/2010 10:10:23  _____________________________________________________________________  External Attachment:    Type:   Image     Comment:   External Document

## 2010-06-03 LAB — BASIC METABOLIC PANEL
BUN: 10 mg/dL (ref 6–23)
BUN: 6 mg/dL (ref 6–23)
CO2: 22 mEq/L (ref 19–32)
Calcium: 7.6 mg/dL — ABNORMAL LOW (ref 8.4–10.5)
Calcium: 7.8 mg/dL — ABNORMAL LOW (ref 8.4–10.5)
Calcium: 8.1 mg/dL — ABNORMAL LOW (ref 8.4–10.5)
Chloride: 98 mEq/L (ref 96–112)
Creatinine, Ser: 1.21 mg/dL — ABNORMAL HIGH (ref 0.4–1.2)
Creatinine, Ser: 1.53 mg/dL — ABNORMAL HIGH (ref 0.4–1.2)
Creatinine, Ser: 1.72 mg/dL — ABNORMAL HIGH (ref 0.4–1.2)
GFR calc Af Amer: 37 mL/min — ABNORMAL LOW (ref >60)
GFR calc Af Amer: 42 mL/min — ABNORMAL LOW (ref >60)
GFR calc Af Amer: 55 mL/min — ABNORMAL LOW (ref >60)
GFR calc non Af Amer: 30 mL/min — ABNORMAL LOW (ref >60)
GFR calc non Af Amer: 35 mL/min — ABNORMAL LOW (ref >60)
GFR calc non Af Amer: 45 mL/min — ABNORMAL LOW (ref >60)
Sodium: 142 mEq/L (ref 135–145)

## 2010-06-03 LAB — BRAIN NATRIURETIC PEPTIDE
Pro B Natriuretic peptide (BNP): 1493 pg/mL — ABNORMAL HIGH (ref 0.0–100.0)
Pro B Natriuretic peptide (BNP): 615 pg/mL — ABNORMAL HIGH (ref 0.0–100.0)

## 2010-06-03 LAB — MRSA PCR SCREENING: MRSA by PCR: NEGATIVE

## 2010-06-04 LAB — COMPREHENSIVE METABOLIC PANEL
AST: 51 U/L — ABNORMAL HIGH (ref 0–37)
Albumin: 4.2 g/dL (ref 3.5–5.2)
Albumin: 4.4 g/dL (ref 3.5–5.2)
Alkaline Phosphatase: 49 U/L (ref 39–117)
Alkaline Phosphatase: 51 U/L (ref 39–117)
BUN: 15 mg/dL (ref 6–23)
BUN: 27 mg/dL — ABNORMAL HIGH (ref 6–23)
CO2: 18 mEq/L — ABNORMAL LOW (ref 19–32)
Chloride: 100 mEq/L (ref 96–112)
Chloride: 106 mEq/L (ref 96–112)
Creatinine, Ser: 1.44 mg/dL — ABNORMAL HIGH (ref 0.4–1.2)
GFR calc Af Amer: 29 mL/min — ABNORMAL LOW (ref >60)
GFR calc non Af Amer: 37 mL/min — ABNORMAL LOW (ref >60)
Glucose, Bld: 187 mg/dL — ABNORMAL HIGH (ref 70–99)
Potassium: 4.2 mEq/L (ref 3.5–5.1)
Potassium: 4.9 mEq/L (ref 3.5–5.1)
Total Bilirubin: 1.2 mg/dL (ref 0.3–1.2)
Total Bilirubin: 1.5 mg/dL — ABNORMAL HIGH (ref 0.3–1.2)
Total Protein: 7.8 g/dL (ref 6.0–8.3)

## 2010-06-04 LAB — CBC
HCT: 33.9 % — ABNORMAL LOW (ref 36.0–46.0)
HCT: 43.7 % (ref 36.0–46.0)
Hemoglobin: 10.4 g/dL — ABNORMAL LOW (ref 12.0–15.0)
MCH: 34.2 pg — ABNORMAL HIGH (ref 26.0–34.0)
MCH: 34.6 pg — ABNORMAL HIGH (ref 26.0–34.0)
MCH: 34.7 pg — ABNORMAL HIGH (ref 26.0–34.0)
MCHC: 34.8 g/dL (ref 30.0–36.0)
MCV: 96.9 fL (ref 78.0–100.0)
MCV: 97.1 fL (ref 78.0–100.0)
MCV: 99 fL (ref 78.0–100.0)
MCV: 99.4 fL (ref 78.0–100.0)
Platelets: 124 10*3/uL — ABNORMAL LOW (ref 150–400)
Platelets: 151 10*3/uL (ref 150–400)
RBC: 3.04 MIL/uL — ABNORMAL LOW (ref 3.87–5.11)
RBC: 4.16 MIL/uL (ref 3.87–5.11)
RBC: 4.5 MIL/uL (ref 3.87–5.11)
RDW: 17.8 % — ABNORMAL HIGH (ref 11.5–15.5)
RDW: 18.1 % — ABNORMAL HIGH (ref 11.5–15.5)
WBC: 4.1 10*3/uL (ref 4.0–10.5)
WBC: 4.5 10*3/uL (ref 4.0–10.5)
WBC: 8.3 10*3/uL (ref 4.0–10.5)

## 2010-06-04 LAB — URINALYSIS, ROUTINE W REFLEX MICROSCOPIC
Bilirubin Urine: NEGATIVE
Hgb urine dipstick: NEGATIVE
Ketones, ur: NEGATIVE mg/dL
Specific Gravity, Urine: 1.009 (ref 1.005–1.030)
Urobilinogen, UA: 0.2 mg/dL (ref 0.0–1.0)

## 2010-06-04 LAB — DIFFERENTIAL
Basophils Absolute: 0 10*3/uL (ref 0.0–0.1)
Basophils Relative: 0 % (ref 0–1)
Basophils Relative: 1 % (ref 0–1)
Eosinophils Relative: 2 % (ref 0–5)
Lymphocytes Relative: 28 % (ref 12–46)
Monocytes Absolute: 0.4 10*3/uL (ref 0.1–1.0)
Monocytes Absolute: 0.7 10*3/uL (ref 0.1–1.0)
Monocytes Relative: 16 % — ABNORMAL HIGH (ref 3–12)
Neutro Abs: 2.2 10*3/uL (ref 1.7–7.7)
Neutro Abs: 7 10*3/uL (ref 1.7–7.7)
Neutrophils Relative %: 85 % — ABNORMAL HIGH (ref 43–77)

## 2010-06-04 LAB — APTT: aPTT: 27 seconds (ref 24–37)

## 2010-06-04 LAB — BASIC METABOLIC PANEL
BUN: 13 mg/dL (ref 6–23)
BUN: 2 mg/dL — ABNORMAL LOW (ref 6–23)
BUN: 20 mg/dL (ref 6–23)
CO2: 20 mEq/L (ref 19–32)
CO2: 21 mEq/L (ref 19–32)
Calcium: 7 mg/dL — ABNORMAL LOW (ref 8.4–10.5)
Chloride: 106 mEq/L (ref 96–112)
Chloride: 112 mEq/L (ref 96–112)
Creatinine, Ser: 1.12 mg/dL (ref 0.4–1.2)
GFR calc Af Amer: >60 mL/min (ref >60)
GFR calc non Af Amer: 36 mL/min — ABNORMAL LOW (ref >60)
GFR calc non Af Amer: 39 mL/min — ABNORMAL LOW (ref >60)
GFR calc non Af Amer: 50 mL/min — ABNORMAL LOW (ref >60)
Glucose, Bld: 100 mg/dL — ABNORMAL HIGH (ref 70–99)
Glucose, Bld: 95 mg/dL (ref 70–99)
Potassium: 3.3 mEq/L — ABNORMAL LOW (ref 3.5–5.1)

## 2010-06-04 LAB — CLOSTRIDIUM DIFFICILE EIA
C difficile Toxins A+B, EIA: NEGATIVE
C difficile Toxins A+B, EIA: NEGATIVE

## 2010-06-04 LAB — RENAL FUNCTION PANEL
Albumin: 2.8 g/dL — ABNORMAL LOW (ref 3.5–5.2)
BUN: 7 mg/dL (ref 6–23)
Chloride: 108 mEq/L (ref 96–112)
Glucose, Bld: 89 mg/dL (ref 70–99)
Potassium: 3.3 mEq/L — ABNORMAL LOW (ref 3.5–5.1)
Sodium: 138 mEq/L (ref 135–145)

## 2010-06-04 LAB — URINE CULTURE: Colony Count: NO GROWTH

## 2010-06-04 LAB — TSH: TSH: 26.768 u[IU]/mL — ABNORMAL HIGH (ref 0.350–4.500)

## 2010-06-04 LAB — PROTIME-INR: INR: 1.04 (ref 0.00–1.49)

## 2010-06-04 LAB — T3, FREE: T3, Free: 1.2 pg/mL — ABNORMAL LOW (ref 2.3–4.2)

## 2010-06-04 LAB — SURGICAL PCR SCREEN: Staphylococcus aureus: NEGATIVE

## 2010-06-04 LAB — LIPASE, BLOOD: Lipase: 22 U/L (ref 11–59)

## 2010-06-04 LAB — GIARDIA/CRYPTOSPORIDIUM SCREEN(EIA): Cryptosporidium Screen (EIA): NEGATIVE

## 2010-06-05 LAB — BASIC METABOLIC PANEL
BUN: 25 mg/dL — ABNORMAL HIGH (ref 6–23)
CO2: 23 mEq/L (ref 19–32)
CO2: 26 mEq/L (ref 19–32)
Calcium: 7.8 mg/dL — ABNORMAL LOW (ref 8.4–10.5)
Calcium: 8.9 mg/dL (ref 8.4–10.5)
Chloride: 99 mEq/L (ref 96–112)
Creatinine, Ser: 1.28 mg/dL — ABNORMAL HIGH (ref 0.4–1.2)
Creatinine, Ser: 1.38 mg/dL — ABNORMAL HIGH (ref 0.4–1.2)
GFR calc Af Amer: 52 mL/min — ABNORMAL LOW (ref >60)
GFR calc non Af Amer: 43 mL/min — ABNORMAL LOW (ref >60)
Glucose, Bld: 94 mg/dL (ref 70–99)
Sodium: 133 mEq/L — ABNORMAL LOW (ref 135–145)

## 2010-06-05 LAB — PROTIME-INR
INR: 1.13 (ref 0.00–1.49)
INR: 1.23 (ref 0.00–1.49)
Prothrombin Time: 14.4 seconds (ref 11.6–15.2)

## 2010-06-05 LAB — BRAIN NATRIURETIC PEPTIDE: Pro B Natriuretic peptide (BNP): 95 pg/mL (ref 0.0–100.0)

## 2010-06-08 LAB — CBC
HCT: 30 % — ABNORMAL LOW (ref 36.0–46.0)
HCT: 31.8 % — ABNORMAL LOW (ref 36.0–46.0)
HCT: 33.4 % — ABNORMAL LOW (ref 36.0–46.0)
Hemoglobin: 11.5 g/dL — ABNORMAL LOW (ref 12.0–15.0)
Hemoglobin: 12.6 g/dL (ref 12.0–15.0)
MCHC: 34.2 g/dL (ref 30.0–36.0)
MCHC: 34.8 g/dL (ref 30.0–36.0)
MCHC: 35.5 g/dL (ref 30.0–36.0)
MCV: 104.1 fL — ABNORMAL HIGH (ref 78.0–100.0)
MCV: 104.8 fL — ABNORMAL HIGH (ref 78.0–100.0)
Platelets: 125 10*3/uL — ABNORMAL LOW (ref 150–400)
Platelets: 135 10*3/uL — ABNORMAL LOW (ref 150–400)
Platelets: 139 10*3/uL — ABNORMAL LOW (ref 150–400)
RBC: 3.17 MIL/uL — ABNORMAL LOW (ref 3.87–5.11)
RBC: 3.4 MIL/uL — ABNORMAL LOW (ref 3.87–5.11)
RDW: 16.8 % — ABNORMAL HIGH (ref 11.5–15.5)
RDW: 16.8 % — ABNORMAL HIGH (ref 11.5–15.5)
RDW: 17 % — ABNORMAL HIGH (ref 11.5–15.5)
RDW: 17.1 % — ABNORMAL HIGH (ref 11.5–15.5)
WBC: 4.8 10*3/uL (ref 4.0–10.5)
WBC: 5.1 10*3/uL (ref 4.0–10.5)
WBC: 5.2 10*3/uL (ref 4.0–10.5)
WBC: 6 10*3/uL (ref 4.0–10.5)

## 2010-06-08 LAB — COMPREHENSIVE METABOLIC PANEL
ALT: 22 U/L (ref 0–35)
ALT: 29 U/L (ref 0–35)
AST: 41 U/L — ABNORMAL HIGH (ref 0–37)
AST: 83 U/L — ABNORMAL HIGH (ref 0–37)
Albumin: 3.1 g/dL — ABNORMAL LOW (ref 3.5–5.2)
Alkaline Phosphatase: 39 U/L (ref 39–117)
Alkaline Phosphatase: 44 U/L (ref 39–117)
BUN: 10 mg/dL (ref 6–23)
BUN: 13 mg/dL (ref 6–23)
CO2: 21 mEq/L (ref 19–32)
Calcium: 7.8 mg/dL — ABNORMAL LOW (ref 8.4–10.5)
Calcium: 8.2 mg/dL — ABNORMAL LOW (ref 8.4–10.5)
Chloride: 101 mEq/L (ref 96–112)
Chloride: 105 mEq/L (ref 96–112)
Creatinine, Ser: 1.14 mg/dL (ref 0.4–1.2)
GFR calc Af Amer: 56 mL/min — ABNORMAL LOW (ref >60)
GFR calc Af Amer: 59 mL/min — ABNORMAL LOW (ref >60)
GFR calc non Af Amer: 46 mL/min — ABNORMAL LOW (ref >60)
Glucose, Bld: 113 mg/dL — ABNORMAL HIGH (ref 70–99)
Potassium: 3.7 mEq/L (ref 3.5–5.1)
Potassium: 4 mEq/L (ref 3.5–5.1)
Sodium: 136 mEq/L (ref 135–145)
Sodium: 139 mEq/L (ref 135–145)
Total Bilirubin: 0.9 mg/dL (ref 0.3–1.2)
Total Protein: 5.6 g/dL — ABNORMAL LOW (ref 6.0–8.3)
Total Protein: 6.1 g/dL (ref 6.0–8.3)
Total Protein: 6.3 g/dL (ref 6.0–8.3)

## 2010-06-08 LAB — CARDIAC PANEL(CRET KIN+CKTOT+MB+TROPI)
CK, MB: 2.1 ng/mL (ref 0.3–4.0)
CK, MB: 3.2 ng/mL (ref 0.3–4.0)
Relative Index: 2.5 (ref 0.0–2.5)
Relative Index: INVALID (ref 0.0–2.5)
Total CK: 157 U/L (ref 7–177)
Total CK: 86 U/L (ref 7–177)
Troponin I: 0.04 ng/mL (ref 0.00–0.06)
Troponin I: 0.04 ng/mL (ref 0.00–0.06)

## 2010-06-08 LAB — APTT
aPTT: 27 seconds (ref 24–37)
aPTT: 29 seconds (ref 24–37)

## 2010-06-08 LAB — LIPID PANEL
HDL: 87 mg/dL (ref >39)
LDL Cholesterol: 80 mg/dL (ref 0–99)
Total CHOL/HDL Ratio: 2 RATIO
Triglycerides: 45 mg/dL (ref <150)
VLDL: 9 mg/dL (ref 0–40)

## 2010-06-08 LAB — POCT I-STAT, CHEM 8
Creatinine, Ser: 1.2 mg/dL (ref 0.4–1.2)
HCT: 40 % (ref 36.0–46.0)
Hemoglobin: 13.6 g/dL (ref 12.0–15.0)
Potassium: 3.7 mEq/L (ref 3.5–5.1)
Sodium: 138 mEq/L (ref 135–145)

## 2010-06-08 LAB — BASIC METABOLIC PANEL
BUN: 11 mg/dL (ref 6–23)
BUN: 12 mg/dL (ref 6–23)
CO2: 26 mEq/L (ref 19–32)
Calcium: 8.2 mg/dL — ABNORMAL LOW (ref 8.4–10.5)
Chloride: 106 mEq/L (ref 96–112)
Creatinine, Ser: 1.1 mg/dL (ref 0.4–1.2)
GFR calc non Af Amer: 49 mL/min — ABNORMAL LOW (ref >60)
GFR calc non Af Amer: 51 mL/min — ABNORMAL LOW (ref >60)
Glucose, Bld: 87 mg/dL (ref 70–99)
Glucose, Bld: 95 mg/dL (ref 70–99)

## 2010-06-08 LAB — PROTIME-INR
INR: 1.1 (ref 0.00–1.49)
INR: 1.35 (ref 0.00–1.49)
INR: 1.47 (ref 0.00–1.49)
Prothrombin Time: 14.1 seconds (ref 11.6–15.2)
Prothrombin Time: 16.8 seconds — ABNORMAL HIGH (ref 11.6–15.2)

## 2010-06-08 LAB — PREPARE FRESH FROZEN PLASMA

## 2010-06-08 LAB — MRSA PCR SCREENING: MRSA by PCR: NEGATIVE

## 2010-06-08 LAB — BRAIN NATRIURETIC PEPTIDE: Pro B Natriuretic peptide (BNP): 809 pg/mL — ABNORMAL HIGH (ref 0.0–100.0)

## 2010-06-08 NOTE — Cardiovascular Report (Signed)
Summary: Certified Letter Returned - Not doing f/u  [facility]   Imported By: Debby Freiberg 05/31/2010 10:06:11  _____________________________________________________________________  External Attachment:    Type:   Image     Comment:   External Document

## 2010-06-09 ENCOUNTER — Encounter: Payer: Self-pay | Admitting: *Deleted

## 2010-06-09 LAB — BASIC METABOLIC PANEL
BUN: 12 mg/dL (ref 6–23)
Calcium: 8.4 mg/dL (ref 8.4–10.5)
Chloride: 93 mEq/L — ABNORMAL LOW (ref 96–112)
Creatinine, Ser: 1.7 mg/dL — ABNORMAL HIGH (ref 0.4–1.2)
GFR calc Af Amer: 48 mL/min — ABNORMAL LOW (ref >60)
GFR calc non Af Amer: 31 mL/min — ABNORMAL LOW (ref >60)
GFR calc non Af Amer: 40 mL/min — ABNORMAL LOW (ref >60)
Glucose, Bld: 229 mg/dL — ABNORMAL HIGH (ref 70–99)
Potassium: 3 mEq/L — ABNORMAL LOW (ref 3.5–5.1)
Sodium: 135 mEq/L (ref 135–145)

## 2010-06-09 LAB — URINALYSIS, MICROSCOPIC ONLY
Glucose, UA: NEGATIVE mg/dL
Hgb urine dipstick: NEGATIVE
Protein, ur: NEGATIVE mg/dL
pH: 6 (ref 5.0–8.0)

## 2010-06-09 LAB — POCT CARDIAC MARKERS
CKMB, poc: <1 ng/mL — ABNORMAL LOW (ref 1.0–8.0)
Myoglobin, poc: 74.2 ng/mL (ref 12–200)
Troponin i, poc: <0.05 ng/mL (ref 0.00–0.09)

## 2010-06-09 LAB — CULTURE, BLOOD (SINGLE): Culture: NO GROWTH

## 2010-06-09 LAB — CBC
HCT: 35.1 % — ABNORMAL LOW (ref 36.0–46.0)
HCT: 36.8 % (ref 36.0–46.0)
MCV: 107.1 fL — ABNORMAL HIGH (ref 78.0–100.0)
MCV: 107.7 fL — ABNORMAL HIGH (ref 78.0–100.0)
Platelets: 202 10*3/uL (ref 150–400)
RBC: 3.26 MIL/uL — ABNORMAL LOW (ref 3.87–5.11)
RDW: 18.4 % — ABNORMAL HIGH (ref 11.5–15.5)
WBC: 7.4 10*3/uL (ref 4.0–10.5)

## 2010-06-09 LAB — CARDIAC PANEL(CRET KIN+CKTOT+MB+TROPI)
CK, MB: 1.6 ng/mL (ref 0.3–4.0)
Relative Index: 1.5 (ref 0.0–2.5)
Relative Index: INVALID (ref 0.0–2.5)
Total CK: 104 U/L (ref 7–177)
Total CK: 91 U/L (ref 7–177)
Total CK: 99 U/L (ref 7–177)
Troponin I: 0.02 ng/mL (ref 0.00–0.06)

## 2010-06-09 LAB — PROTIME-INR: Prothrombin Time: 16.2 seconds — ABNORMAL HIGH (ref 11.6–15.2)

## 2010-06-09 LAB — LIPID PANEL
HDL: 107 mg/dL (ref >39)
LDL Cholesterol: 143 mg/dL — ABNORMAL HIGH (ref 0–99)
Triglycerides: 31 mg/dL (ref <150)
VLDL: 6 mg/dL (ref 0–40)

## 2010-06-09 LAB — URINE CULTURE: Colony Count: 75000

## 2010-06-09 LAB — FOLATE RBC: RBC Folate: 512 ng/mL (ref 180–600)

## 2010-06-09 LAB — HEPARIN LEVEL (UNFRACTIONATED): Heparin Unfractionated: 0.69 IU/mL (ref 0.30–0.70)

## 2010-06-14 ENCOUNTER — Ambulatory Visit (INDEPENDENT_AMBULATORY_CARE_PROVIDER_SITE_OTHER): Payer: Medicare Other | Admitting: *Deleted

## 2010-06-14 DIAGNOSIS — I509 Heart failure, unspecified: Secondary | ICD-10-CM

## 2010-06-14 LAB — POCT INR: INR: 1.3

## 2010-06-16 ENCOUNTER — Other Ambulatory Visit: Payer: Self-pay | Admitting: *Deleted

## 2010-06-16 ENCOUNTER — Ambulatory Visit (INDEPENDENT_AMBULATORY_CARE_PROVIDER_SITE_OTHER): Payer: Medicare Other | Admitting: Cardiology

## 2010-06-16 ENCOUNTER — Encounter: Payer: Self-pay | Admitting: Cardiology

## 2010-06-16 ENCOUNTER — Inpatient Hospital Stay (HOSPITAL_COMMUNITY)
Admission: AD | Admit: 2010-06-16 | Discharge: 2010-06-18 | DRG: 287 | Disposition: A | Discharge status: Home / Self Care | Payer: Medicare Other | Source: Ambulatory Visit | Attending: Cardiology | Admitting: Cardiology

## 2010-06-16 DIAGNOSIS — I359 Nonrheumatic aortic valve disorder, unspecified: Secondary | ICD-10-CM

## 2010-06-16 DIAGNOSIS — I2510 Atherosclerotic heart disease of native coronary artery without angina pectoris: Secondary | ICD-10-CM

## 2010-06-16 DIAGNOSIS — N183 Chronic kidney disease, stage 3 unspecified: Secondary | ICD-10-CM | POA: Diagnosis present

## 2010-06-16 DIAGNOSIS — Z7982 Long term (current) use of aspirin: Secondary | ICD-10-CM

## 2010-06-16 DIAGNOSIS — I2589 Other forms of chronic ischemic heart disease: Secondary | ICD-10-CM | POA: Diagnosis present

## 2010-06-16 DIAGNOSIS — Z95810 Presence of automatic (implantable) cardiac defibrillator: Secondary | ICD-10-CM

## 2010-06-16 DIAGNOSIS — I498 Other specified cardiac arrhythmias: Secondary | ICD-10-CM | POA: Diagnosis present

## 2010-06-16 DIAGNOSIS — Z87891 Personal history of nicotine dependence: Secondary | ICD-10-CM

## 2010-06-16 DIAGNOSIS — I252 Old myocardial infarction: Secondary | ICD-10-CM

## 2010-06-16 DIAGNOSIS — I5022 Chronic systolic (congestive) heart failure: Secondary | ICD-10-CM | POA: Diagnosis present

## 2010-06-16 DIAGNOSIS — E039 Hypothyroidism, unspecified: Secondary | ICD-10-CM | POA: Diagnosis present

## 2010-06-16 DIAGNOSIS — R079 Chest pain, unspecified: Secondary | ICD-10-CM

## 2010-06-16 DIAGNOSIS — M1990 Unspecified osteoarthritis, unspecified site: Secondary | ICD-10-CM | POA: Diagnosis present

## 2010-06-16 DIAGNOSIS — Z7901 Long term (current) use of anticoagulants: Secondary | ICD-10-CM

## 2010-06-16 DIAGNOSIS — M5137 Other intervertebral disc degeneration, lumbosacral region: Secondary | ICD-10-CM | POA: Diagnosis present

## 2010-06-16 DIAGNOSIS — Z8673 Personal history of transient ischemic attack (TIA), and cerebral infarction without residual deficits: Secondary | ICD-10-CM

## 2010-06-16 DIAGNOSIS — R072 Precordial pain: Principal | ICD-10-CM | POA: Diagnosis present

## 2010-06-16 DIAGNOSIS — I129 Hypertensive chronic kidney disease with stage 1 through stage 4 chronic kidney disease, or unspecified chronic kidney disease: Secondary | ICD-10-CM | POA: Diagnosis present

## 2010-06-16 DIAGNOSIS — Z9861 Coronary angioplasty status: Secondary | ICD-10-CM

## 2010-06-16 DIAGNOSIS — I213 ST elevation (STEMI) myocardial infarction of unspecified site: Secondary | ICD-10-CM

## 2010-06-16 DIAGNOSIS — Z882 Allergy status to sulfonamides status: Secondary | ICD-10-CM

## 2010-06-16 DIAGNOSIS — I509 Heart failure, unspecified: Secondary | ICD-10-CM | POA: Diagnosis present

## 2010-06-16 DIAGNOSIS — M51379 Other intervertebral disc degeneration, lumbosacral region without mention of lumbar back pain or lower extremity pain: Secondary | ICD-10-CM | POA: Diagnosis present

## 2010-06-16 DIAGNOSIS — Z933 Colostomy status: Secondary | ICD-10-CM

## 2010-06-16 DIAGNOSIS — I219 Acute myocardial infarction, unspecified: Secondary | ICD-10-CM

## 2010-06-16 LAB — POCT I-STAT, CHEM 8
BUN: 25 mg/dL — ABNORMAL HIGH (ref 6–23)
Calcium, Ion: 1.1 mmol/L — ABNORMAL LOW (ref 1.12–1.32)
Glucose, Bld: 186 mg/dL — ABNORMAL HIGH (ref 70–99)
TCO2: 22 mmol/L (ref 0–100)

## 2010-06-16 LAB — COMPREHENSIVE METABOLIC PANEL
Albumin: 3.6 g/dL (ref 3.5–5.2)
Alkaline Phosphatase: 62 U/L (ref 39–117)
BUN: 22 mg/dL (ref 6–23)
Chloride: 106 mEq/L (ref 96–112)
Glucose, Bld: 183 mg/dL — ABNORMAL HIGH (ref 70–99)
Potassium: 3.4 mEq/L — ABNORMAL LOW (ref 3.5–5.1)
Total Bilirubin: 0.6 mg/dL (ref 0.3–1.2)

## 2010-06-16 LAB — CBC
Hemoglobin: 12.9 g/dL (ref 12.0–15.0)
MCHC: 34.8 g/dL (ref 30.0–36.0)
RBC: 3.8 MIL/uL — ABNORMAL LOW (ref 3.87–5.11)
WBC: 4.3 10*3/uL (ref 4.0–10.5)

## 2010-06-16 LAB — CARDIAC PANEL(CRET KIN+CKTOT+MB+TROPI)
CK, MB: 2.3 ng/mL (ref 0.3–4.0)
CK, MB: 2.2 ng/mL (ref 0.3–4.0)
Relative Index: 1.7 (ref 0.0–2.5)
Relative Index: 1.7 (ref 0.0–2.5)

## 2010-06-16 LAB — LIPID PANEL
Cholesterol: 284 mg/dL — ABNORMAL HIGH (ref 0–200)
LDL Cholesterol: 138 mg/dL — ABNORMAL HIGH (ref 0–99)
VLDL: 12 mg/dL (ref 0–40)

## 2010-06-16 LAB — BRAIN NATRIURETIC PEPTIDE: Pro B Natriuretic peptide (BNP): 235 pg/mL — ABNORMAL HIGH (ref 0.0–100.0)

## 2010-06-16 LAB — PROTIME-INR
INR: 1.53 — ABNORMAL HIGH (ref 0.00–1.49)
Prothrombin Time: 18.6 seconds — ABNORMAL HIGH (ref 11.6–15.2)

## 2010-06-16 LAB — TSH: TSH: 48.843 u[IU]/mL — ABNORMAL HIGH (ref 0.350–4.500)

## 2010-06-16 LAB — HEMOGLOBIN A1C: Hgb A1c MFr Bld: 4.8 % (ref <5.7)

## 2010-06-16 MED ORDER — NITROGLYCERIN 0.4 MG SL SUBL: 0.4000 mg | SUBLINGUAL_TABLET | SUBLINGUAL | Status: DC | PRN

## 2010-06-16 NOTE — Progress Notes (Signed)
Subjective:   [name] presents today with onset of social chest discomfort with diaphoresis and mild nausea at 9 AM. Because the symptoms, she presents to our office her EKG shows ST segment elevation in the inferior lateral leads. She does have a defibrillator in place. Vital signs were relatively stable. We emergently called for EMS and [facility] and the cath lab and [name] was transported to the catheterization lab for ST elevation myocardial infarction.  No current outpatient prescriptions on file.    Allergies  Allergen Reactions  . Penicillins   . Statins     Patient Active Problem List  Diagnoses  . HYPOTHYROIDISM  . HYPERLIPIDEMIA  . HYPERTENSION  . MYOCARDIAL INFARCTION, HX OF  . CORONARY ARTERY DISEASE  . CONGESTIVE HEART FAILURE  . OSTEOARTHRITIS  . CEREBROVASCULAR ACCIDENT, HX OF  . DIVERTICULITIS, HX OF    History  Smoking status  . Not on file  Smokeless tobacco  . Not on file    History  Alcohol Use: Not on file    No family history on file.  Review of Systems:   The patient denies any heat or cold intolerance.  No weight gain or weight loss.  The patient denies headaches or blurry vision.  There is no cough or sputum production.  The patient denies dizziness.  There is no hematuria or hematochezia.  The patient denies any muscle aches or arthritis.  The patient denies any rash.  The patient denies frequent falling or instability.  There is no history of depression or anxiety.  All other systems were reviewed and are negative.   Physical Exam:   Vital signs reviewed. She was mildly diaphoretic and slightly anxious. HEENT was unremarkable. There's no jugular venous distention. Lungs were clear. ICD is in the left upper chest. Heart showed regular rate and rhythm. Extremities are without edema. Colostomy bag is present on the abdomen. EKG shows sinus rhythm with acute ST segment elevation in the inferior lateral leads with evidence for the inferior apical  infarction it was old. Assessment / Plan:

## 2010-06-17 ENCOUNTER — Inpatient Hospital Stay (HOSPITAL_COMMUNITY): Payer: Medicare Other

## 2010-06-17 LAB — CARDIAC PANEL(CRET KIN+CKTOT+MB+TROPI)
CK, MB: 2.1 ng/mL (ref 0.3–4.0)
Relative Index: 1.7 (ref 0.0–2.5)
Total CK: 116 U/L (ref 7–177)
Troponin I: 0.05 ng/mL (ref 0.00–0.06)

## 2010-06-17 LAB — CBC
MCV: 98.1 fL (ref 78.0–100.0)
Platelets: 143 10*3/uL — ABNORMAL LOW (ref 150–400)
RBC: 3.63 MIL/uL — ABNORMAL LOW (ref 3.87–5.11)
WBC: 4.5 10*3/uL (ref 4.0–10.5)

## 2010-06-17 LAB — BASIC METABOLIC PANEL
Calcium: 8.2 mg/dL — ABNORMAL LOW (ref 8.4–10.5)
Creatinine, Ser: 1.28 mg/dL — ABNORMAL HIGH (ref 0.4–1.2)
GFR calc Af Amer: 51 mL/min — ABNORMAL LOW (ref >60)

## 2010-06-17 LAB — HEPARIN LEVEL (UNFRACTIONATED): Heparin Unfractionated: 0.82 IU/mL — ABNORMAL HIGH (ref 0.30–0.70)

## 2010-06-17 LAB — PROTIME-INR
INR: 1.44 (ref 0.00–1.49)
Prothrombin Time: 17.7 seconds — ABNORMAL HIGH (ref 11.6–15.2)

## 2010-06-18 LAB — BASIC METABOLIC PANEL
Calcium: 8.9 mg/dL (ref 8.4–10.5)
Creatinine, Ser: 1.45 mg/dL — ABNORMAL HIGH (ref 0.4–1.2)
GFR calc Af Amer: 45 mL/min — ABNORMAL LOW (ref >60)
GFR calc non Af Amer: 37 mL/min — ABNORMAL LOW (ref >60)
Glucose, Bld: 85 mg/dL (ref 70–99)
Sodium: 136 mEq/L (ref 135–145)

## 2010-06-18 LAB — PROTIME-INR: Prothrombin Time: 14.9 seconds (ref 11.6–15.2)

## 2010-06-21 ENCOUNTER — Other Ambulatory Visit (INDEPENDENT_AMBULATORY_CARE_PROVIDER_SITE_OTHER): Payer: Medicare Other | Admitting: *Deleted

## 2010-06-21 ENCOUNTER — Other Ambulatory Visit: Payer: Medicare Other | Admitting: *Deleted

## 2010-06-21 ENCOUNTER — Telehealth: Payer: Self-pay | Admitting: Cardiology

## 2010-06-21 ENCOUNTER — Encounter (INDEPENDENT_AMBULATORY_CARE_PROVIDER_SITE_OTHER): Payer: Medicare Other | Admitting: Nurse Practitioner

## 2010-06-21 DIAGNOSIS — Z7901 Long term (current) use of anticoagulants: Secondary | ICD-10-CM

## 2010-06-21 DIAGNOSIS — Z79899 Other long term (current) drug therapy: Secondary | ICD-10-CM

## 2010-06-21 LAB — URINALYSIS, MICROSCOPIC ONLY
Glucose, UA: NEGATIVE mg/dL
Protein, ur: NEGATIVE mg/dL
Specific Gravity, Urine: 1.012 (ref 1.005–1.030)
Urobilinogen, UA: 0.2 mg/dL (ref 0.0–1.0)

## 2010-06-21 LAB — BASIC METABOLIC PANEL
BUN: 20 mg/dL (ref 6–23)
CO2: 27 mEq/L (ref 19–32)
Calcium: 7.4 mg/dL — ABNORMAL LOW (ref 8.4–10.5)
Calcium: 7.8 mg/dL — ABNORMAL LOW (ref 8.4–10.5)
Calcium: 8.9 mg/dL (ref 8.4–10.5)
GFR calc Af Amer: 34 mL/min — ABNORMAL LOW (ref >60)
GFR calc Af Amer: 53 mL/min — ABNORMAL LOW (ref >60)
GFR calc non Af Amer: 43 mL/min — ABNORMAL LOW (ref >60)
GFR: 54.59 mL/min — ABNORMAL LOW (ref >60.00)
Potassium: 3.7 mEq/L (ref 3.5–5.1)
Potassium: 3.9 mEq/L (ref 3.5–5.1)
Potassium: 4.3 mEq/L (ref 3.5–5.1)
Sodium: 132 mEq/L — ABNORMAL LOW (ref 135–145)
Sodium: 134 mEq/L — ABNORMAL LOW (ref 135–145)
Sodium: 136 mEq/L (ref 135–145)

## 2010-06-21 LAB — POCT I-STAT, CHEM 8
BUN: 8 mg/dL (ref 6–23)
Calcium, Ion: 0.99 mmol/L — ABNORMAL LOW (ref 1.12–1.32)
HCT: 49 % — ABNORMAL HIGH (ref 36.0–46.0)
Sodium: 136 mEq/L (ref 135–145)
TCO2: 24 mmol/L (ref 0–100)

## 2010-06-21 LAB — COMPREHENSIVE METABOLIC PANEL
Alkaline Phosphatase: 39 U/L (ref 39–117)
BUN: 7 mg/dL (ref 6–23)
CO2: 27 mEq/L (ref 19–32)
Chloride: 100 mEq/L (ref 96–112)
Creatinine, Ser: 1.2 mg/dL (ref 0.4–1.2)
GFR calc non Af Amer: 46 mL/min — ABNORMAL LOW (ref >60)
Glucose, Bld: 163 mg/dL — ABNORMAL HIGH (ref 70–99)
Potassium: 4.2 mEq/L (ref 3.5–5.1)
Total Bilirubin: 0.6 mg/dL (ref 0.3–1.2)

## 2010-06-21 LAB — POCT CARDIAC MARKERS
CKMB, poc: <1 ng/mL — ABNORMAL LOW (ref 1.0–8.0)
Myoglobin, poc: 58.4 ng/mL (ref 12–200)
Troponin i, poc: <0.05 ng/mL (ref 0.00–0.09)

## 2010-06-21 LAB — CBC
HCT: 40.2 % (ref 36.0–46.0)
Hemoglobin: 13.8 g/dL (ref 12.0–15.0)
MCV: 106.5 fL — ABNORMAL HIGH (ref 78.0–100.0)
Platelets: 190 10*3/uL (ref 150–400)
RBC: 3.82 MIL/uL — ABNORMAL LOW (ref 3.87–5.11)
RBC: 4.36 MIL/uL (ref 3.87–5.11)
WBC: 4.9 10*3/uL (ref 4.0–10.5)
WBC: 7.7 10*3/uL (ref 4.0–10.5)

## 2010-06-21 LAB — DIFFERENTIAL
Eosinophils Absolute: 0.1 10*3/uL (ref 0.0–0.7)
Lymphocytes Relative: 14 % (ref 12–46)
Lymphs Abs: 1.1 10*3/uL (ref 0.7–4.0)
Monocytes Relative: 6 % (ref 3–12)
Neutro Abs: 6.1 10*3/uL (ref 1.7–7.7)
Neutrophils Relative %: 79 % — ABNORMAL HIGH (ref 43–77)

## 2010-06-21 LAB — CULTURE, BLOOD (ROUTINE X 2)
Culture: NO GROWTH
Culture: NO GROWTH

## 2010-06-21 LAB — LIPID PANEL
HDL: 45 mg/dL (ref >39)
LDL Cholesterol: 127 mg/dL — ABNORMAL HIGH (ref 0–99)
Total CHOL/HDL Ratio: 4.2 RATIO
Triglycerides: 88 mg/dL (ref <150)
VLDL: 18 mg/dL (ref 0–40)

## 2010-06-21 LAB — URINE CULTURE
Colony Count: NO GROWTH
Culture: NO GROWTH

## 2010-06-21 LAB — CARDIAC PANEL(CRET KIN+CKTOT+MB+TROPI)
CK, MB: 1.4 ng/mL (ref 0.3–4.0)
Total CK: 112 U/L (ref 7–177)
Troponin I: 0.18 ng/mL — ABNORMAL HIGH (ref 0.00–0.06)
Troponin I: 0.18 ng/mL — ABNORMAL HIGH (ref 0.00–0.06)

## 2010-06-21 LAB — TYPE AND SCREEN: ABO/RH(D): A POS

## 2010-06-21 LAB — BRAIN NATRIURETIC PEPTIDE: Pro B Natriuretic peptide (BNP): 1681 pg/mL — ABNORMAL HIGH (ref 0.0–100.0)

## 2010-06-21 LAB — MAGNESIUM: Magnesium: 1.6 mg/dL (ref 1.5–2.5)

## 2010-06-21 LAB — PROTIME-INR: INR: 1.22 (ref 0.00–1.49)

## 2010-06-21 LAB — TSH: TSH: 11.149 u[IU]/mL — ABNORMAL HIGH (ref 0.350–4.500)

## 2010-06-21 NOTE — Telephone Encounter (Signed)
HAS BEEN IN OUTPATIENT REHAB. THEY NEED SOMETHING IN WRITING FOR PATIENT TO RETURN TO THERAPY. [facility]  FAX [phone] PHONE [phone]

## 2010-06-21 NOTE — Telephone Encounter (Signed)
RN faxed ok to continue physical therapy to [facility] [phone]) signed by Norma Fredrickson NP.  Left pt a voicemail that order had been sent.

## 2010-06-22 ENCOUNTER — Telehealth: Payer: Self-pay | Admitting: *Deleted

## 2010-06-22 ENCOUNTER — Ambulatory Visit: Payer: Medicare Other | Admitting: Cardiology

## 2010-06-22 NOTE — Progress Notes (Signed)
This encounter was created in error - please disregard.

## 2010-06-22 NOTE — Telephone Encounter (Signed)
Pt notified of lab results and to continue same medications.   

## 2010-06-22 NOTE — Telephone Encounter (Signed)
Message copied by Barnetta Hammersmith on Tue Jun 22, 2010  5:01 PM ------      Message from: Roger Shelter      Created: Tue Jun 22, 2010  1:34 PM       OK to call.

## 2010-06-30 ENCOUNTER — Encounter: Payer: Self-pay | Admitting: Cardiology

## 2010-06-30 ENCOUNTER — Ambulatory Visit (INDEPENDENT_AMBULATORY_CARE_PROVIDER_SITE_OTHER): Payer: Medicare Other | Admitting: Cardiology

## 2010-06-30 ENCOUNTER — Encounter: Payer: Medicare Other | Admitting: *Deleted

## 2010-06-30 ENCOUNTER — Ambulatory Visit (INDEPENDENT_AMBULATORY_CARE_PROVIDER_SITE_OTHER): Payer: Medicare Other | Admitting: *Deleted

## 2010-06-30 DIAGNOSIS — K5792 Diverticulitis of intestine, part unspecified, without perforation or abscess without bleeding: Secondary | ICD-10-CM

## 2010-06-30 DIAGNOSIS — I255 Ischemic cardiomyopathy: Secondary | ICD-10-CM | POA: Insufficient documentation

## 2010-06-30 DIAGNOSIS — N189 Chronic kidney disease, unspecified: Secondary | ICD-10-CM

## 2010-06-30 DIAGNOSIS — K219 Gastro-esophageal reflux disease without esophagitis: Secondary | ICD-10-CM | POA: Insufficient documentation

## 2010-06-30 DIAGNOSIS — K5732 Diverticulitis of large intestine without perforation or abscess without bleeding: Secondary | ICD-10-CM

## 2010-06-30 DIAGNOSIS — I2589 Other forms of chronic ischemic heart disease: Secondary | ICD-10-CM

## 2010-06-30 DIAGNOSIS — I10 Essential (primary) hypertension: Secondary | ICD-10-CM

## 2010-06-30 DIAGNOSIS — M2550 Pain in unspecified joint: Secondary | ICD-10-CM | POA: Insufficient documentation

## 2010-06-30 DIAGNOSIS — K559 Vascular disorder of intestine, unspecified: Secondary | ICD-10-CM | POA: Insufficient documentation

## 2010-06-30 DIAGNOSIS — R200 Anesthesia of skin: Secondary | ICD-10-CM | POA: Insufficient documentation

## 2010-06-30 DIAGNOSIS — Z7901 Long term (current) use of anticoagulants: Secondary | ICD-10-CM | POA: Insufficient documentation

## 2010-06-30 DIAGNOSIS — Z720 Tobacco use: Secondary | ICD-10-CM | POA: Insufficient documentation

## 2010-06-30 DIAGNOSIS — M1990 Unspecified osteoarthritis, unspecified site: Secondary | ICD-10-CM | POA: Insufficient documentation

## 2010-06-30 DIAGNOSIS — Z8673 Personal history of transient ischemic attack (TIA), and cerebral infarction without residual deficits: Secondary | ICD-10-CM | POA: Insufficient documentation

## 2010-06-30 DIAGNOSIS — M4800 Spinal stenosis, site unspecified: Secondary | ICD-10-CM | POA: Insufficient documentation

## 2010-06-30 NOTE — Progress Notes (Signed)
Subjective:   [name] comes in today for a post hospital visit. Since her last visit, she's had back surgery as well as emergency resection with a partial colectomy and colostomy creation. She had hospital as a with congestive heart failure. She was hospitalized recently with chest discomfort and EKG changes but had essentially an unremarkable cardiac catheterization this month. She has poor left ventricular function.  She has ischemic cardiomyopathy with a severe LV dysfunction. She an anterior apical infarction in 2003. She had PCI of the LAD and subsequent stenting of the left circumflex. Her most recent catheterization, her stents were patent. Her ejection fraction is 25%. She is a chronically abnormal EKG. 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 She does have an ICD. She's had multiple back surgeries, shoulder surgery, knee surgery,. She's had a previous stroke. She has significant arthritis. She has chronic renal insufficiency and a history of tobacco abuse. She does have gastroesophageal reflux a history of diverticulitis.  Current Outpatient Prescriptions  Medication Sig Dispense Refill  . amiodarone (PACERONE) 200 MG tablet Take 200 mg by mouth daily.        [name] aspirin 81 MG tablet Take 81 mg by mouth daily.        . carvedilol (COREG CR) 20 MG 24 hr capsule Take 20 mg by mouth daily.        . Diclofenac-Misoprostol (ARTHROTEC PO) Take 75 mg by mouth daily.        [name] ezetimibe (ZETIA) 10 MG tablet Take 10 mg by mouth daily.        [name] FA-Pyridoxine-Cyancobalamin (FOLTX PO) Take by mouth daily.        . fish oil-omega-3 fatty acids 1000 MG capsule Take 1,000 mg by mouth daily.        . fluticasone (FLONASE) 50 MCG/ACT nasal spray 2 sprays by Nasal route as needed.        . furosemide (LASIX) 40 MG tablet Take 40 mg by mouth daily.        [name] levothyroxine (SYNTHROID, LEVOTHROID) 175 MCG tablet Take 175 mcg by mouth daily.        [name]  lisinopril (PRINIVIL,ZESTRIL) 2.5 MG tablet Take 2.5 mg by mouth 2 (two) times daily.        . Methocarbamol (ROBAXIN PO) Take by mouth as needed.        . nitroGLYCERIN (NITROSTAT) 0.4 MG SL tablet Place 1 tablet (0.4 mg total) under the tongue every 5 (five) minutes as needed for chest pain.  25 tablet  3  . Oxycodone-Acetaminophen (PERCOCET PO) Take by mouth as needed.        . Pantoprazole Sodium (PROTONIX PO) Take by mouth daily.        . potassium chloride SA (K-DUR,KLOR-CON) 20 MEQ tablet Take 20 mEq by mouth daily.        [name] warfarin (COUMADIN) 3 MG tablet Take 3 mg by mouth daily. As directed        . cyclobenzaprine (FLEXERIL) 5 MG tablet Take 1 tablet (5 mg total) by mouth daily.  30 tablet  0    Allergies  Allergen Reactions  . Penicillins   . Statins     Patient Active Problem List  Diagnoses  . HYPOTHYROIDISM  . HYPERLIPIDEMIA  . HYPERTENSION  . MYOCARDIAL INFARCTION, HX OF  . CORONARY ARTERY DISEASE  . CONGESTIVE HEART FAILURE  . OSTEOARTHRITIS  . CEREBROVASCULAR ACCIDENT, HX OF  . DIVERTICULITIS, HX OF  .  ST elevation MI (STEMI)  . Spinal stenosis  . Numbness of foot  . Diverticulitis  . Ischemia, bowel  . Joint pain  . Ischemic cardiomyopathy  . Apical myocardial infarction  . LAD stenosis  . Chronic anticoagulation  . Stroke  . Arthritis  . Chronic renal insufficiency  . Tobacco abuse  . GERD (gastroesophageal reflux disease)    History  Smoking status  . Former Smoker  . Quit date: 03/21/2005  Smokeless tobacco  . Not on file    History  Alcohol Use No    No family history on file.  Review of Systems:   The patient denies any heat or cold intolerance.  No weight gain or weight loss.  The patient denies headaches or blurry vision.  There is no cough or sputum production.  The patient denies dizziness.  There is no hematuria or hematochezia.  The patient denies any muscle aches or arthritis.  The patient denies any rash.  The patient denies  frequent falling or instability.  There is no history of depression or anxiety.  All other systems were reviewed and are negative.   Physical Exam:   Weight is 145. Blood pressure is 140/90 sitting, heart rate is 86The head is normocephalic and atraumatic.  Pupils are equally round and reactive to light.  Sclerae nonicteric.  Conjunctiva is clear.  Oropharynx is unremarkable.  There's adequate oral airway.  Neck is supple there are no masses.  Thyroid is not enlarged.  There is no lymphadenopathy.  Lungs are clear.  Chest is symmetric.  Heart shows a regular rate and rhythm.  S1 and S2 are normal.  There is no murmur click or gallop.  Abdomen is soft normal bowel sounds. Is colostomy in left upper quadrant. There is no organomegaly.  Genital and rectal deferred.  Extremities are without edema.  Peripheral pulses are adequate.  Neurologically intact.  Full range of motion.  The patient is not depressed.  Skin is warm and dry.  Assessment / Plan:

## 2010-06-30 NOTE — Assessment & Plan Note (Signed)
Blood pressure slightly elevated today but in general has been well controlled.

## 2010-06-30 NOTE — Assessment & Plan Note (Signed)
She has a history of renal insufficiency. Lab work will be reviewed.

## 2010-06-30 NOTE — Assessment & Plan Note (Signed)
She's doing reasonably well today. Her catheterization was unremarkable in March 2012. She's not had recurrent congestive heart failure. 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

## 2010-06-30 NOTE — Assessment & Plan Note (Signed)
She is healing from a partial colectomy. Plans for reanastomosis are in July 2012.

## 2010-07-01 NOTE — Procedures (Signed)
NAMEMARGEAUX, SWANTEK                ACCOUNT NO.:  [id number]  MEDICAL RECORD NO.:  [id number]           PATIENT TYPE:  I  LOCATION:  2925                         FACILITY:  [facility]  PHYSICIAN:  Arturo Morton. Riley Kill, MD, FACCDATE OF BIRTH:  [birth year]  DATE OF PROCEDURE: DATE OF DISCHARGE:                           CARDIAC CATHETERIZATION   INDICATIONS:  This very nice lady has had prior stenting of both the LAD and circumflex.  She has an implantable defibrillator.  She had onset of chest pain 10/10, and she was seen by Dr. Deborah Chalk.  There is inferior Qs with some ST elevation, which is clearly different from her previous EKG, and she was brought over emergently.  Her INR on Monday was 1.4, and her Coumadin dose had been increased in the interim.  We spoke with her on arrival in the lab and her symptoms were improved.  There was not definite ST elevation on the monitor on arrival.  We elected to study her radial artery.  PROCEDURES: 1. Aortic root angiography. 2. Selective coronary arteriography. 3. Placement of catheters without left heart catheterization.  DESCRIPTION OF PROCEDURE:  The procedure was performed from the right radial artery.  We placed a 5-French sheath.  Intra-arterial verapamil 3 mg intravenous heparin at 3000 units was given.  Views of the right and left coronaries were obtained.  I reviewed the films carefully and compared them to the old films.  I spoke with Dr. Deborah Chalk.  There were no obvious areas of new total occlusion.  As a result, the procedure was completed.  A TR band was placed in the right hand.  I spoke with her family.  She was taken to the holding area in satisfactory condition.  HEMODYNAMIC DATA:  The central aortic pressure was 106/61, mean 18.  ANGIOGRAPHIC DATA: 1. The aortic root appears to be relatively normal in size.  There is     at least mild aortic regurgitation. 2. The right coronary artery is segmentally diseased throughout the  entire proximal segment with about 50% narrowing and 40-50%     narrowing throughout the proximal mid vessel, the distal vessel     opens up.  There is excellent TIMI III flow to the PDA and     posterolateral system both of which have mild luminal     irregularities. 3. The left main is free of critical disease. 4. The LAD courses to the apex.  There is a previously placed stent of     the vessel which is less than 30% obstructed.  There is a smaller     first diagonal that has an ostial 80, the second diagonal has about     60% ostial narrowing with 40% in the native, and there is probably     about 50% narrowing in the distal left anterior descending artery     as well. 5. The circumflex is a fairly large-caliber vessel.  The large first     marginal is previously stented.  There is about 30% narrowing     proximally, then about 20+ and 20% narrowing in the stented  segment, distally the vessels widely patent.  The AV circumflex     provides a large posterolateral branch which is free of critical     disease.  CONCLUSIONS: 1. Mild aortic regurgitation. 2. Continued patency of both the LAD and circumflex stents. 3. Scattered luminal irregularities.  DISPOSITION:  The etiology of her symptoms are unclear.  We did not do LV angiography because of previous history of LV mural thrombus.  We will get a 2-D echocardiogram.  Serial enzymes will be obtained.  Dr. Deborah Chalk will see the patient.     Arturo Morton. Riley Kill, MD, [facility]     TDS/MEDQ  D:  06/16/2010  T:  06/17/2010  Job:  [id number]  cc:   Colleen Can. Deborah Chalk, M.D. CV Laboratory  Electronically Signed by Shawnie Pons MD [facility] on 07/01/2010 05:41:06 AM

## 2010-07-01 NOTE — H&P (Signed)
[name], [name]                ACCOUNT NO.:  [id number]  MEDICAL RECORD NO.:  [id number]           PATIENT TYPE:  I  LOCATION:  2925                         FACILITY:  [facility]  PHYSICIAN:  Arturo Morton. Riley Kill, MD, FACCDATE OF BIRTH:  19-Oct-1950  DATE OF ADMISSION:  06/16/2010 DATE OF DISCHARGE:                             HISTORY & PHYSICAL   PRIMARY CARE PHYSICIAN:  Sanda Linger, MD  PRIMARY CARDIOLOGIST:  Colleen Can. Deborah Chalk, MD  CHIEF COMPLAINT:  Chest pain.  HISTORY OF PRESENT ILLNESS:  [name] is a 60 year old female with a history of coronary artery disease and ischemic cardiomyopathy.  She had onset of substernal chest pain at approximately 9 a.m.  At first, the symptoms were vague and she was not quite sure with her usual angina. She went to Dr. Ronnald Nian office.  The symptoms worsened and her chest pain reached to 10/10.  It was associated with shortness of breath, diaphoresis, and some nausea, but no vomiting.  Her defibrillator felt like it was "tingling" but did not fire.  Dr. Deborah Chalk check an EKG and she was in sinus rhythm, but her EKG was consistent with an inferolateral MI.  She was given aspirin 81 mg x4 and sublingual nitroglycerin x1.  She was transported by EMS to [facility] and taken directly to the Cath Lab.  Upon arrival to Cath Lab, her chest pain was a 3-4/10.  She has not had other recent episodes of this chest pain.  It is her usual angina.  PAST MEDICAL HISTORY: 1. Status post acute anterior STEMI in August 2003 with a 3.0 x 18-mm     Hepacoat stent to the LAD and staged placement of a 3.0 x 23-mm     Cypher stent to the LAD. 2. History of chest pain with an abnormal EKG in January 2009 and     April 2010, medical therapy for coronary artery disease recommended     at that time, no culprit vessel noted. 3. Ischemic cardiomyopathy with an EF of 25-30% by echocardiogram in     April 2011. 4. History of Coumadin use for apical akinesis and possible  LV     thrombus, INR pending. 5. Ischemic cardiomyopathy, status post Guidant Vitality ICD in 2005,     change out to a Guidant Vitality 2EL ICD in 2006. 6. Chronic systolic CHF. 7. History of ischemic colitis. 8. Lower GI bleeding in August 2011 with anemia. 9. History of TIA and possible CVA, hospitalized for this in 2004. 10.Supraventricular tachycardia. 11.Hypothyroidism. 12.History of moderate AI by echocardiogram. 13.Hypertension. 14.History of right pseudoaneurysm, treated with compression. 15.Chronic kidney disease, stage III. 16.History of stress test in June 2011 showing a large anterior,     inferior and apical scar with left ventricular dysfunction, but no     ischemia. 17.Osteoarthritis. 18.Lumbar disk disease.  SURGICAL HISTORY:  She is status post sigmoid colectomy and colostomy in January 2012 as well as cardiac catheterizations, multiple back surgeries, EGD and colonoscopy, and knee surgery.  She has also had a right rotator cuff repair.  ALLERGIES:  She is allergic or intolerant to PENICILLIN, STATINS,  and CLINDAMYCIN.  She is also intolerant of Prevacid.  CURRENT MEDICATIONS:  As per the discharge summary in January 2012. 1. Tylenol p.r.n. 2. Coreg CR 20 mg daily. 3. Coumadin 3 mg as directed. 4. Fexofenadine 180 mg a day. 5. Fish oil 1000 mg daily. 6. Flonase daily. 7. Flutex daily. 8. Lasix 40 mg b.i.d. 9. Vicodin p.r.n. 10.Robaxin q.6 h. p.r.n. 11.Sublingual nitroglycerin p.r.n. 12.Potassium 20 mEq daily. 13.Protonix 40 mg a day. 14.Synthroid 175 mcg daily. 15.Zetia 10 mg a day.  SOCIAL HISTORY:  She lives in [place] alone.  She is retired from [employer].  She was a Futures trader.  She has approximately 10-pack-year history of tobacco use, but quit more than 10 years ago.  She has no history of alcohol or drug abuse.  FAMILY HISTORY:  Her mother died at 10 with diabetes and renal failure. Her father lived into his 35s and neither of her  parents nor her brother have any history of coronary artery disease.  REVIEW OF SYSTEMS:  Her colostomy is functioning well.  She has not had fevers or chills.  She has chronic back pain and musculoskeletal pain, but these are currently well controlled on her home medications.  The chest pain and associated symptoms are described above.  She has not had reflux symptoms or melena.  She has not had palpitations and feels that her volume status is good.  Full 14-point review of systems is otherwise negative except as stated in the HPI.  PHYSICAL EXAMINATION:  GENERAL:  She is a well-developed, middle-aged [place] American female in mild-to-moderate distress. HEENT:  Normal. NECK:  There is no lymphadenopathy, thyromegaly, bruit, or JVD noted. CV:  Her heart is regular in rate and rhythm with an S1-S2 and no critically significant murmur, rub, or gallop is noted.  Distal pulses are intact in all four extremities. LUNGS:  Essentially clear to auscultation bilaterally. SKIN:  No rashes or lesions are noted.  Her ostomy site looks well healed. ABDOMEN:  Soft and nontender with active bowel sounds. EXTREMITIES:  There is no cyanosis, clubbing or edema noted. MUSCULOSKELETAL:  There is no joint deformity or effusions. NEUROLOGIC:  She is alert and oriented with cranial nerves II through XII grossly intact.  Chest x-ray and labs are pending.  EKG is sinus rhythm with acute inferolateral ST changes.  IMPRESSION:  Acute ST elevation myocardial infarction:  She is being taken emergently to the Cath Lab with further evaluation and treatment depending on the results.  We will check labs including her coags.  She will be continued on home medications and we will screen for cardiac risk factors.     Theodore Demark, PA-C   ______________________________ Arturo Morton. Riley Kill, MD, [facility]    RB/MEDQ  D:  06/16/2010  T:  06/17/2010  Job:  [id number]  Electronically Signed by Theodore Demark PA-C on  06/23/2010 01:14:12 PM Electronically Signed by Shawnie Pons MD [facility] on 07/01/2010 05:41:03 AM

## 2010-07-01 NOTE — Discharge Summary (Signed)
NAMEYUI, MULVANEY                ACCOUNT NO.:  [id number]  MEDICAL RECORD NO.:  [id number]           PATIENT TYPE:  I  LOCATION:  2014                         FACILITY:  [facility]  PHYSICIAN:  Arturo Morton. Riley Kill, MD, FACCDATE OF BIRTH:  20-Aug-1950  DATE OF ADMISSION:  06/16/2010 DATE OF DISCHARGE:  06/18/2010                              DISCHARGE SUMMARY   PRIMARY CARDIOLOGIST:  Colleen Can. Deborah Chalk, MD  PRIMARY CARE PROVIDER:  Sanda Linger, MD  DISCHARGE DIAGNOSIS:  Chest pain without objective evidence of ischemia.  SECONDARY DIAGNOSES: 1. Coronary artery disease status post prior anterior myocardial     infarction in 2003 with stenting of the left anterior descending     (coronary) artery. 2. Ischemic cardiomyopathy with an ejection fraction of 25-30% by     echocardiogram this admission. 3. Chronic systolic congestive heart failure. 4. History of left ventricular thrombus on chronic Coumadin therapy. 5. Hypertension. 6. History of ischemic colitis. 7. History of lower gastrointestinal bleed August 2011 with anemia. 8. History of transient ischemic attack and possible cerebrovascular     accident, hospitalized in 2004. 9. Supraventricular tachycardia. 10.Hypothyroidism. 11.Moderate aortic insufficiency. 12.Stage III chronic kidney disease. 13.Osteoarthritis. 14.Lumbar disk disease. 15.History of right foreign pseudoaneurysm treated with compression. 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 17.Status post multiple back surgeries. 18.Status post right rotator cuff repair.  ALLERGIES:  PENICILLIN, STATINS, CLINDAMYCIN, PREVACID.  PROCEDURES: 1. Left heart cardiac catheterization performed emergently secondary     to ST-segment elevation, revealing patent left anterior descending     (coronary) artery and obtuse marginal stent with moderate disease     in a first diagonal branch, otherwise nonobstructive disease.     Medical therapy was  recommended. 2. Two-D echocardiogram June 08, 2010, ejection fraction 25-30% with     akinesis of the mid-to-distal anteroseptal and apical myocardium.     Mild aortic insufficiency.  Mild mitral regurgitation.  Mild     tricuspid regurgitation.  HISTORY OF PRESENT ILLNESS:  A 59 year old female with history of CAD status post prior MI and stenting of the LAD and obtuse marginal in the past who was in her usual state of health until 9 a.m. on the day of admission when she had sudden onset of 10/10 substernal chest discomfort similar to prior angina associated with dyspnea and diaphoresis and nausea.  She presented to [facility], saw Dr. Deborah Chalk where an ECG was performed and was consistent with inferolateral MI with ST- segment elevation.  She was taken emergently to the [facility] for evaluation.  HOSPITAL COURSE:  The patient underwent emergent diagnostic catheterization in the setting of presumed ST elevation MI.  However, catheterization revealed patent stent in the LAD and obtuse marginal, otherwise nonobstructive disease.  There was no angiographic evidence for occlusion or explanation for her ST-segment elevation.  Of note, the patient is on Coumadin chronically and INR was elevated on admission at 1.53.  Her procedure was performed via a radial access.  Postprocedure, the patient has had no recurrence of chest discomfort. Her cardiac enzymes remained negative and ECG did improve compared to the  one performed in the office.  Post cath, she was placed on IV heparin which was subsequently discontinued and Coumadin was resumed this morning.  She has been ambulating without difficulty and will be discharged home today in good condition.  Of note, the patient's TSH was found to be elevated at 48.843.  Upon review, the patient has been on 175 mcg of Synthroid daily and this dose has not been recently changed.  We have taken the liberty to increase this dose to  200 mcg daily and she will require a followup thyroid function testing in the next 4-6 weeks.  DISCHARGE LABORATORY DATA:  Hemoglobin 12.3, hematocrit 35.6, WBC 4.5, platelets 143, INR 1.15.  Sodium 136, potassium 3.7, chloride 100, CO2 23, BUN 21, creatinine 1.45, glucose 85.  Total bilirubin 0.6, alkaline phosphatase 62, AST 38, ALT 16, total protein 6.5, albumin 3.6, calcium 8.9, magnesium 1.8, hemoglobin A1c 4.8, CK 123, MB 2.1, troponin-I 0.04. BNP 235.  Total cholesterol 284, triglycerides 59, HDL 134, LDL 138. TSH 48.843.  MRSA screen was negative.  DISPOSITION:  The patient will be discharged home today in good condition.  FOLLOWUP PLANS AND APPOINTMENTS:  We have arranged a follow-up with Norma Fredrickson, nurse practitioner on June 21, 2010, at 8:30 a.m.  At that time, she will also have an INR and basic metabolic panel evaluated. She will follow up with Dr. Sanda Linger as previously scheduled.  DISCHARGE MEDICATIONS: 1. Aspirin 81 mg daily. 2. Coumadin 3 mg half a tablet once in Saturday, 1 tablet on all other     days. 3. Coreg CR 20 mg daily. 4. Flonase 1 spray at bedtime. 5. Foltx 1 tablet daily. 6. Fexofenadine 180 mg daily. 7. Fish oil 1000 mg daily. 8. Furosemide 40 mg daily. 9. Hydrocodone/APAP 5/325 mg 1-2 tablets q.4 h. p.r.n. 10.Lisinopril 2.5 mg b.i.d. 11.Methocarbamol 500 mg q.6 h. p.r.n. 12.Nitroglycerin 0.4 mg sublingual p.r.n. chest pain. 13.Potassium chloride 20 mEq daily. 14.Protonix 40 mg daily. 15.Synthroid 200 mcg daily. 16.Zetia 10 mg daily.  OUTSTANDING LABORATORY STUDIES:  Follow-up PT/INR/basic metabolic panel on Monday, June 21, 2010.  The patient will require followup thyroid function testing in 4-6 weeks, this will be carried out in  primary care setting.  DURATION OF DISCHARGE ENCOUNTER:  45 minutes including physician time.     Nicolasa Ducking, ANP   ______________________________ Arturo Morton. Riley Kill, MD, [facility]    CB/MEDQ   D:  06/18/2010  T:  06/19/2010  Job:  [id number]  cc:   Sanda Linger, MD  Electronically Signed by Nicolasa Ducking ANP on 06/29/2010 04:08:25 PM Electronically Signed by Shawnie Pons MD [facility] on 07/01/2010 05:41:09 AM

## 2010-07-06 LAB — COMPREHENSIVE METABOLIC PANEL
AST: 28 U/L (ref 0–37)
CO2: 26 mEq/L (ref 19–32)
Chloride: 103 mEq/L (ref 96–112)
Creatinine, Ser: 1.16 mg/dL (ref 0.4–1.2)
GFR calc Af Amer: 58 mL/min — ABNORMAL LOW (ref >60)
GFR calc non Af Amer: 48 mL/min — ABNORMAL LOW (ref >60)
Glucose, Bld: 80 mg/dL (ref 70–99)
Total Bilirubin: 0.6 mg/dL (ref 0.3–1.2)

## 2010-07-06 LAB — URINALYSIS, ROUTINE W REFLEX MICROSCOPIC
Bilirubin Urine: NEGATIVE
Hgb urine dipstick: NEGATIVE
Ketones, ur: NEGATIVE mg/dL
Protein, ur: NEGATIVE mg/dL
Urobilinogen, UA: 0.2 mg/dL (ref 0.0–1.0)

## 2010-07-06 LAB — DIFFERENTIAL
Basophils Absolute: 0 10*3/uL (ref 0.0–0.1)
Eosinophils Absolute: 0.1 10*3/uL (ref 0.0–0.7)
Eosinophils Relative: 2 % (ref 0–5)
Lymphocytes Relative: 34 % (ref 12–46)
Neutrophils Relative %: 50 % (ref 43–77)

## 2010-07-06 LAB — CBC
HCT: 40 % (ref 36.0–46.0)
Hemoglobin: 13.7 g/dL (ref 12.0–15.0)
MCV: 105 fL — ABNORMAL HIGH (ref 78.0–100.0)
RBC: 3.81 MIL/uL — ABNORMAL LOW (ref 3.87–5.11)
WBC: 4 10*3/uL (ref 4.0–10.5)

## 2010-07-06 LAB — PROTIME-INR
INR: 1.2 (ref 0.00–1.49)
INR: 1.3 (ref 0.00–1.49)
Prothrombin Time: 16.7 seconds — ABNORMAL HIGH (ref 11.6–15.2)

## 2010-07-09 ENCOUNTER — Ambulatory Visit (INDEPENDENT_AMBULATORY_CARE_PROVIDER_SITE_OTHER): Payer: Medicare Other | Admitting: *Deleted

## 2010-07-09 ENCOUNTER — Encounter: Payer: Self-pay | Admitting: Cardiology

## 2010-07-09 DIAGNOSIS — I2589 Other forms of chronic ischemic heart disease: Secondary | ICD-10-CM

## 2010-07-09 DIAGNOSIS — I255 Ischemic cardiomyopathy: Secondary | ICD-10-CM

## 2010-07-09 LAB — POCT INR: INR: 1.5

## 2010-07-19 ENCOUNTER — Encounter: Payer: Medicare Other | Admitting: *Deleted

## 2010-07-21 ENCOUNTER — Ambulatory Visit (INDEPENDENT_AMBULATORY_CARE_PROVIDER_SITE_OTHER): Payer: Medicare Other | Admitting: *Deleted

## 2010-07-21 DIAGNOSIS — I255 Ischemic cardiomyopathy: Secondary | ICD-10-CM

## 2010-07-21 DIAGNOSIS — I2589 Other forms of chronic ischemic heart disease: Secondary | ICD-10-CM

## 2010-07-21 LAB — POCT INR: INR: 1.8

## 2010-07-22 NOTE — Patient Instructions (Signed)
Pt notified of coumadin instructions and verbalized to RN understanding of instructions.

## 2010-07-26 ENCOUNTER — Other Ambulatory Visit: Payer: Self-pay | Admitting: Nurse Practitioner

## 2010-07-28 ENCOUNTER — Ambulatory Visit (INDEPENDENT_AMBULATORY_CARE_PROVIDER_SITE_OTHER): Payer: Medicare Other | Admitting: *Deleted

## 2010-07-28 DIAGNOSIS — I2589 Other forms of chronic ischemic heart disease: Secondary | ICD-10-CM

## 2010-07-30 ENCOUNTER — Encounter: Payer: Medicare Other | Admitting: *Deleted

## 2010-08-03 NOTE — Cardiovascular Report (Signed)
[name], [name] NO.:  [id number]   MEDICAL RECORD NO.:  [id number]          PATIENT TYPE:  INP   LOCATION:  2915                         FACILITY:  [facility]   PHYSICIAN:  Corky Crafts, MDDATE OF BIRTH:  1951-01-22   DATE OF PROCEDURE:  03/31/2007  DATE OF DISCHARGE:                            CARDIAC CATHETERIZATION   REFERRING PHYSICIAN:  Colleen Can. Deborah Chalk, M.D.   PROCEDURE PERFORMED:  Left heart catheterization, left ventriculogram,  coronary angiogram, abdominal aortogram.   OPERATOR:  Corky Crafts, M.D.   INDICATIONS:  Inferolateral ST elevation MI.   PROCEDURE NARRATIVE:  The risks and benefits of cardiac cath were  explained to the patient quickly, and informed consent was obtained.  The patient was brought to the cath lab emergently.  She was prepped and  draped in the usual sterile fashion.  Her right groin was infiltrated  with 1% lidocaine.  A 6-French arterial sheath was placed into the right  femoral artery using the modified Seldinger technique.  Left coronary  artery angiography was performed using a JL-4 pigtail catheter.  The  catheter was advanced to the vessel ostium under fluoroscopic guidance.  Digital angiography was performed in multiple projections using hand  injection of contrast.  Right coronary artery angiography was then  performed using a JR-4 catheter.  The catheter was advanced to the  vessel ostium under fluoroscopic guidance.  Digital angiography was  performed in multiple projections using hand injection of contrast.  Pigtail catheter was advanced to the ascending aorta and across the  aortic valve.  Power injection of contrast was performed in the AP  projection to image the left ventricle.  The catheter was pulled back  under continuous hemodynamic pressure monitoring.  The catheter was  pulled back to the abdominal aorta.  Power injection of contrast was  performed in the AP projection to image the  abdominal aorta.  The sheath  was removed, and an Angio-Seal was deployed since the patient was on  Coumadin.   FINDINGS:  The left main was widely patent.  The left circumflex was a  large vessel with mild irregularities.  The OM-1 was a large vessel with  a patent stent.  The circumflex was a codominant system.  The left  anterior descending was a large vessel which goes to the apex.  There  was a patent midvessel stent.  There was a small first diagonal with an  80% ostial lesion.  The second diagonal is medium size.  In the  midvessel, there was a 40% stenosis.  The right coronary artery was a  medium size codominant vessel.  There was a diffuse 40% stenosis in the  proximal to midvessel.  The left ventriculogram showed a large area of  anteroapical, apical, and inferoapical akinesis.  The estimated ejection  fraction is 20-25%.   HEMODYNAMIC RESULTS:  Left ventricular pressure 179/15 with an LVEDP of  15 mmHg.  Aortic pressure 177/93 with a mean aortic pressure of 130  mmHg.  The abdominal aortogram showed no abdominal aortic aneurysm.  There were single renal arteries bilaterally.  There are mild  irregularities in the mid renals on both sides.   IMPRESSION:  1. Patent intracoronary stent.  No significant coronary artery      disease.  2. Decreased left ventricular ejection fraction with prior anterior      infarction, ejection fraction of 20-25%.  3. No abdominal aortic aneurysm.  4. Significantly elevated blood pressure.   RECOMMENDATIONS:  She needs aggressive blood pressure control.  Likely  her chest pain is coming in part because of the increased BP.  Once we  started nitroglycerin, her chest pain seemed to resolve as her blood  pressure came down.  Will watch her overnight.  If her blood pressure  remains stable and she feels better, we will likely let her go home  tomorrow.      Corky Crafts, MD  Electronically Signed     JSV/MEDQ  D:  03/31/2007  T:   04/01/2007  Job:  [id number]

## 2010-08-03 NOTE — H&P (Signed)
[name], [name] NO.:  [id number]   MEDICAL RECORD NO.:  [id number]          PATIENT TYPE:  INP   LOCATION:  2915                         FACILITY:  [facility]   PHYSICIAN:  Corky Crafts, MDDATE OF BIRTH:  Oct 08, 1950   DATE OF ADMISSION:  03/31/2007  DATE OF DISCHARGE:                              HISTORY & PHYSICAL   REFERRING PHYSICIAN:  Colleen Can. Deborah Chalk, M.D.   REASON FOR CONSULTATION:  Possible acute MI, congestive heart failure,  prior coronary artery disease.   HISTORY OF PRESENT ILLNESS:  The patient is a 60 year old woman with  known coronary artery disease.  She has had several stents placed.  She  had a large MI in the anterior territory which led to severe LV  dysfunction and AICD placement.  Earlier today, she had severe chest  pain at rest that started at about 11 this morning.  It occurred while  she was at church.  It felt like a prior MI.  She did not have any  shortness of breath, nausea, or vomiting.  The pain did not radiate.  When she came in to the emergency room, her blood pressure was very  high.  She also had inferolateral ST-segment elevations.  Her pain was  still 4/10.  Her blood pressure usually runs in the 90s/60s range, and  this was very out of the ordinary for her.   MEDICATIONS:  1. Lisinopril 2.5 mg daily.  2. Coreg 6.25 mg b.i.d.  3. Coumadin 2 mg daily.  4. Aspirin 81 mg daily.  5. Zetia 10 mg daily.  6. Synthroid 100 mcg daily.  7. Folate 1 mg daily.  8. Arthrotec.  9. Potassium 20 mEq daily.  10.Lasix 40 mg day.  11.Tricor 48 mg daily.  12.Multiple other vitamins, the names of which the patient is unsure      of at this time.   ALLERGIES:  PENICILLIN.   PAST MEDICAL HISTORY:  1. Coronary artery disease status post MI.  2. Congestive heart failure.  3. Arthritis.   PAST SURGICAL HISTORY:  1. Back surgery x2.  2. Left total knee replacement.  3. Right knee surgery.  4. AICD placement.   FAMILY  HISTORY:  Significant for coronary artery disease.   SOCIAL HISTORY:  She does not smoke.  She quit 8 years ago.  Occasional  alcohol.  She lives alone.  She is retired.   REVIEW OF SYSTEMS:  Significant for chest pain, otherwise negative.   PHYSICAL EXAMINATION:  VITAL SIGNS:  Blood pressure 180/90, heart rate  of 101.  GENERAL:  She is awake, alert, no apparent distress.  HEENT:  Normocephalic, atraumatic.  Eyes: Extraocular movements intact.  NECK:  No JVD.  CARDIOVASCULAR:  Tachycardiac, S1-S2.  LUNGS:  Clear to auscultation anteriorly.  ABDOMEN:  Soft, nontender.  EXTREMITIES:  2+ right femoral pulse.  No edema.  NEUROLOGIC:  No focal deficits.   LABORATORY DATA:  Labs are pending.   ASSESSMENT/PLAN:  60 year old with multiple prior stents who has  electrocardiogram changes concerning for an acute myocardial infarction.   PLAN:  1.  Will take her emergently to the cath lab given her symptoms and EKG      changes.  2. Aggressive blood pressure control.  3. She will likely go to the CCU postprocedure.  Further plan based on      cath results.  4. Continue with lipid lowering therapy.  5. She will need to continue her antihypertensive medicines.      Corky Crafts, MD  Electronically Signed     JSV/MEDQ  D:  03/31/2007  T:  04/01/2007  Job:  [id number]

## 2010-08-03 NOTE — Op Note (Signed)
NAMEKHRISTIAN, [name] NO.:  [id number]   MEDICAL RECORD NO.:  [id number]          PATIENT TYPE:  INP   LOCATION:  3008                         FACILITY:  [facility]   PHYSICIAN:  Clydene Fake, M.D.  DATE OF BIRTH:  1950/11/25   DATE OF PROCEDURE:  08/09/2007  DATE OF DISCHARGE:                               OPERATIVE REPORT   PREOPERATIVE DIAGNOSES:  1. T3, T4, and T6 compression fractures.  2. Osteoporosis.   POSTOPERATIVE DIAGNOSES:  1. T3, T4, and T6 compression fractures.  2. Osteoporosis.   PROCEDURES:  T3, T4, and T6 balloon kyphoplasty with methyl  methacrylate, intraoperative fluoroscopy, and biopsies of T4 and T6.   SURGEON:  Clydene Fake, MD   ANESTHESIA:  General endotracheal tube anesthesia.   ESTIMATED BLOOD LOSS:  Minimal.   BLOOD GIVEN:  None.   DRAINS:  None.   COMPLICATIONS:  None.   REASON FOR PROCEDURE:  The patient is a 60 year old woman with  significant cardiac problems, who had some severe thoracic back pain.  Workup included x-rays and CT which showed superior endplate compression  of T3, compression fractures significantly at T4 and T6.  The patient  was treated with brace.  Sequential imaging with x-ray showed worsening  compression of T6.  The patient was brought in for kyphoplasty.   PROCEDURE IN DETAIL:  The patient was brought to the operating room and  general anesthesia was induced.  The patient was placed in a prone  position and all pressure points were padded.  The patient was prepped  and draped in sterile fashion, and AP and lateral fluoroscopy was set  up, so we can see the T3, T4, and T6 vertebral bodies and pedicles.  On  the left side, entry point for T6 was found using the fluoroscopy.  A  stab incision was then made after injecting couple of milliliters of 1%  lidocaine with epinephrine.  Bovie needle was placed to the T2 pedicle,  T3 pedicle and into the vertebral body.  Under fluoroscopic imaging,  placed a balloon into the T6 vertebral body, blew up the balloon.  Then  attention taken to the T4 area on the right side.  After injecting 2 mL  of 1% lidocaine with epinephrine, a stab incision was made and Bovie  needle was placed to the pedicle and then entered the pedicle up to the  vertebral body under fluoroscopic imaging.  Both T4 and T6 prior to  balloon placement, biopsies were taken- core biopsy.  We then drilled a  hole into the vertebral body, entered the vertebral body out of the  foramen and placed the balloon.  Both balloons making a space  within  the vertebral body, may be getting some slight restoration of site.  We  then removed a balloon and injected methyl methacrylate under  fluoroscopic imaging into the vertebral body and repeated the second at  the other level.  We tamped methyl methacrylate down through the working  ports with probe and removed the bone needle.  This was done at both  levels.  We then  found an entry point for T3 on the left side and a stab  incision after injecting 2 mL of local, placed a bumper down the pedicle  into the vertebral body of T3.  Used a drill to make a center channel,  placed a balloon into the vertebral body.  Blew up the balloon, removed  that and injected methyl methacrylate into T3 vertebral body under  fluoroscopic guidance.  Final AP and lateral fluoroscopic images were  obtained showing good position of methyl methacrylate within T3, T4, and  T6.  The Bovie needle was removed.  One subcuticular stitch was placed  at each stab incision and then Dermabond was used to close the skin and  Band-Aids were placed over the 3 small incisions.  The patient was then  placed back in the supine position, awakened from anesthesia, and  transferred to the recovery room in stable condition.           ______________________________  Clydene Fake, M.D.     JRH/MEDQ  D:  08/09/2007  T:  08/10/2007  Job:  [id number]

## 2010-08-03 NOTE — Discharge Summary (Signed)
[name], [name] NO.:  [id number]   MEDICAL RECORD NO.:  [id number]          PATIENT TYPE:  REC   LOCATION:  OREH                         FACILITY:  [facility]   PHYSICIAN:  Corky Crafts, MDDATE OF BIRTH:  Oct 17, 1950   DATE OF ADMISSION:  03/31/2007  DATE OF DISCHARGE:  04/01/2007                               DISCHARGE SUMMARY   Discharge Diagnosis:  1. Coronary Artery Disease  2. Cardiomyopathy  3. Hypertension  4. Chronic Coumadin Use  5. High Cholesterol  6. Hypothyroidism   HISTORY OF PRESENT ILLNESS:  I dictated for [name], although,  I do not participate directly in this patient's care.   [name] is a 60 year old female with known coronary artery disease who  developed severe chest pain at rest while at church.  She stated it felt  like her prior myocardial infarction pain.  The pain did not radiate.  EKG showed inferior lateral ST-segment elevation, therefore, she was  taken directly to the cardiac catheterization lab.   Catheterization showed circumflex with some mild irregularities.  The  first OM showed a patent stent and was a codominant vessel.  The LAD was  a large vessel and the stent was patent.  The first diagonal had 80%  ostial lesion with a 50% mid vessel stenosis.  The RCA was codominant  and had a 40% proximal to mid stenosis.  EF around 20-25% with a large  anterior apical, inferior apical, as well as apical akinesis.   The patient was kept in the hospital overnight and her troponins were  negative x3.  Her blood pressure was also elevated significantly during  the case.  This was aggressively treated.  By the hospital day #1, we  felt the patient was ready for discharge home.  She was to follow up  with Dr. Deborah [name] for any further management of her blood pressure.   LABORATORY STUDIES:  White count 4.7, hemoglobin 11.4, hematocrit 33.5,  platelets 138, sodium 136, potassium 3.8, BUN 18, creatinine 1.4 CK-MB  troponin negative x2   DISCHARGE MEDICATIONS:  1. Lisinopril 2.5 mg twice a day.  2. Coreg 6.25 mg twice.  3. Coumadin 2 mg a day.  4. Baby aspirin daily.  5. Zetia 10 mg a day.  6. Synthroid 100 mcg a day  7. Folate 1 mg daily.  8. Arthrotec 75 mg daily.  9. Potassium 20 mEq daily.  10.Lasix 20 mg a day.  11.Tricor one tablet daily.  Essentially, medications were same as prior to her hospitalization.   DISCHARGE INSTRUCTIONS:  We have instructed her to use Tylenol if needed  for pain.  She is to call and make a follow-up appointment with Dr.  Deborah [name].      [name], P.A.      Corky Crafts, MD  Electronically Signed    LB/MEDQ  D:  04/25/2007  T:  04/26/2007  Job:  [id number]

## 2010-08-03 NOTE — Op Note (Signed)
[name], [name]                ACCOUNT NO.:  [id number]   MEDICAL RECORD NO.:  [id number]          PATIENT TYPE:  AMB   LOCATION:  SDS                          FACILITY:  [facility]   PHYSICIAN:  Dyke Brackett, M.D.    DATE OF BIRTH:  May 03, 1950   DATE OF PROCEDURE:  05/14/2008  DATE OF DISCHARGE:                               OPERATIVE REPORT   INDICATIONS:  A 60 year old with significant cardiac history with a CT  arthrogram-proven rotator cuff tear, was advised this may or may not  have been repairable.  She is cleared by Dr. Deborah Chalk, advised about  preoperative management and prepared for surgery.   PREOPERATIVE DIAGNOSES:  1. Large retracted right rotator cuff tear.  2. Partial biceps tendon tear.  3. Degenerative tear, anterosuperior and inferior labrum.  4. Acromioclavicular arthritis with impingement.   POSTOPERATIVE DIAGNOSES:  1. Large retracted right rotator cuff tear.  2. Partial biceps tendon tear.  3. Degenerative tear, anterosuperior and inferior labrum.  4. Acromioclavicular arthritis with impingement.   OPERATION:  1. Open rotator cuff repair and acromioplasty.  2. Arthroscopic debridement of labrum and biceps tendon.  3. Open distal clavicle excision.   SURGEON:  Dyke Brackett, M.D.   ASSISTANT:  Joslyn Devon. Smith, PA.   DESCRIPTION OF PROCEDURE:  After a general anesthetic administered in  beach-chair positioning, arthroscopy portals were created posteriorly  and anteriorly.  She had significant tendinosis and tendinopathy with  subscap interstitial tearing.  No frank tearing of the bone.  This was  debrided.  Additionally, she had a probably 20-30% biceps rupture.  The  anterior leading edge of the subscap may have been incompetent.  There  was a large cuff tear, it was visualized.  Glenohumeral joint was  debrided separate from the open procedure with debridement of the  labrum, joint, as well as the biceps tendon.   Attention was next directed to an  open procedure with incision resecting  acromial interval.  Severe AC arthropathy with hypertrophy of the  clavicle was noted, we resected about 1 cm of the distal clavicle.  Anterior leading edge of the CA ligament was calcified, was resected.  Very thick acromion was noted, resected about 8-9 mm of the anterior  leading edge to relieve the significant impingement.  Thickened,  fibrotic bursa was excised revealing a large cuff tear, which was a U-  shaped tear.  This was trimmed with #15 blade.  Superior surface of the  tuberosity burred, cuff mobilized.  The interstitial deepest part of the  U was oversewn with interrupted FiberWire.  This converted with the  curve mainly to  almost a transverse component and then an Arthrex  anchor with 55 with each of these sutures were  used to repair the supraspinatus back to bone with the small edge  oversewn additionally to that.  This created essentially a watertight  repair.  The split of deltoid was repaired with #1 Ethibond with 2-0  Vicryl in the subcutaneous tissues.  Sling was applied.  Taken to  recovery room in stable condition.  Dyke Brackett, M.D.  Electronically Signed     WDC/MEDQ  D:  05/14/2008  T:  05/15/2008  Job:  [id number]

## 2010-08-06 ENCOUNTER — Telehealth: Payer: Self-pay | Admitting: Cardiology

## 2010-08-06 ENCOUNTER — Other Ambulatory Visit (INDEPENDENT_AMBULATORY_CARE_PROVIDER_SITE_OTHER): Payer: Self-pay | Admitting: Surgery

## 2010-08-06 ENCOUNTER — Encounter: Payer: Self-pay | Admitting: Cardiology

## 2010-08-06 DIAGNOSIS — Z933 Colostomy status: Secondary | ICD-10-CM

## 2010-08-06 DIAGNOSIS — K5792 Diverticulitis of intestine, part unspecified, without perforation or abscess without bleeding: Secondary | ICD-10-CM

## 2010-08-06 LAB — HM COLONOSCOPY

## 2010-08-06 NOTE — Op Note (Signed)
[name], [name]                          ACCOUNT NO.:  [id number]   MEDICAL RECORD NO.:  [id number]                   PATIENT TYPE:  AMB   LOCATION:  ENDO                                 FACILITY:  [facility]   PHYSICIAN:  Petra Kuba, M.D.                 DATE OF BIRTH:  1951/03/10   DATE OF PROCEDURE:  03/05/2003  DATE OF DISCHARGE:                                 OPERATIVE REPORT   PROCEDURE:  Colonoscopy.   INDICATIONS FOR PROCEDURE:  Anemia.   CONSENT:  Consent was signed after risks, benefits, methods, and options  were thoroughly discussed in the office.   MEDICATIONS USED:  Demerol 60, Versed 6.   PROCEDURE:  Rectal inspection was pertinent for small external hemorrhoids.  Digital exam was negative.  The pediatric video adjustable colonoscope was  inserted and easily advanced around the colon to the cecum.  This did not  require any abdominal pressure or position changes.  Other than some left  greater than right diverticula, no abnormality was seen on insertion.  The  cecum was identified by the appendiceal orifice and the ileocecal valve.  The scope was inserted a short way into the terminal ileum which was normal.  Photodocumentation was obtained and the scope was slowly withdrawn.  There  were no signs of bleeding seen.  On slow withdrawal through the colon, the  prep was adequate.  There was some liquid stool that required washing and  suctioning.  There was a rare right diverticula, otherwise, moderate on the  left.  No polypoid lesions, masses, or AVMs were seen.  Anorectal pull  through and retroflexion in the rectum confirms small hemorrhoids.  The  scope was reinserted a short ways up the left side of the colon, air was  suctioned, the scope was removed.  The patient tolerated the procedure well.  There was no obvious immediate complications.   ENDOSCOPIC DIAGNOSIS:  1. Internal and external hemorrhoids.  2. Left moderate greater than right diverticula.  3.  Otherwise, within normal limits to the terminal ileum.   PLAN:  Continue workup with one time endoscopy with further workup and  plans, please see that report.  Repeat colon screening in 5-10 years.                                               Petra Kuba, M.D.    MEM/MEDQ  D:  03/05/2003  T:  03/05/2003  Job:  [id number]   cc:   Colleen Can. Deborah Chalk, M.D.  Fax: [phone]

## 2010-08-06 NOTE — Discharge Summary (Signed)
NAMEFAVOR, KREH                          ACCOUNT NO.:  [id number]   MEDICAL RECORD NO.:  [id number]                   PATIENT TYPE:  INP   LOCATION:  2035                                 FACILITY:  [facility]   PHYSICIAN:  Duke Salvia, M.D.               DATE OF BIRTH:  [birth year]   DATE OF ADMISSION:  07/31/2003  DATE OF DISCHARGE:  08/01/2003                                 DISCHARGE SUMMARY   HISTORY OF PRESENT ILLNESS:  This is a 60 year old female with past medical  history of a MI, status post PCI with CHF, positive in-stent restenosis,  repeat PCI, status post three TIAs.  This was associated with loss of vision  in the left eye.  EF is 29% with 6% heart beats are PVCs.  She was admitted  for biventricular ICD.   HOSPITAL COURSE:  Upon admission, the patient's BUN and creatinine were 41  and 2.6.  She underwent placement of a Guidant-type defibrillator on Jul 31, 2003 with BPTs of equal to or greater than 50 joules.  She tolerated the  procedure well.  Had no immediate postoperative complication and was  discharged the following day in stable condition.   DISCHARGE MEDICATIONS:  She was discharged on all of her previous  medications Tylenol 1-2 tablets q.4-6h. p.r.n.   ACTIVITY:  Per discharge sheet.   WOUND CARE:  Per discharge sheet.   DIET:  Low fat, low salt, low cholesterol diet.   FOLLOW UP:  She was to follow up with Dr. Colleen Can. Deborah Chalk for her PT/INR  on Monday or Tuesday.  She was to be seen in the [facility] at [facility]  on Aug 11, 2003 at 9:30 a.m. and Dr. Duke Salvia on December 02, 2003  at 2 p.m.      Chinita Pester, C.R.N.P. [facility]                 Duke Salvia, M.D.    DS/MEDQ  D:  08/01/2003  T:  08/02/2003  Job:  [id number]   cc:   Duke Salvia, M.D.   Colleen Can. Deborah Chalk, M.D.  Fax: [phone]   Device Clinic at [facility]

## 2010-08-06 NOTE — Discharge Summary (Signed)
[name], [name]                ACCOUNT NO.:  [id number]   MEDICAL RECORD NO.:  [id number]          PATIENT TYPE:  INP   LOCATION:  5018                         FACILITY:  [facility]   PHYSICIAN:  Dyke Brackett, M.D.    DATE OF BIRTH:  01-Feb-1951   DATE OF ADMISSION:  12/17/2003  DATE OF DISCHARGE:  12/22/2003                                 DISCHARGE SUMMARY   ADMISSION DIAGNOSES:  1.  End-stage osteoarthritis, left knee.  2.  Hypertension.  3.  Coronary artery disease with a history of a myocardial infarction,      secondary to a clot.  4.  Cardiac defibrillator.  5.  Mild chronic renal insufficiency.  6.  Degenerative disk disease of the lumbar spine.  7.  History of congestive heart failure in November 2003.  8.  History of transient ischemic attack x3 in the past.   DISCHARGE DIAGNOSES:  1.  End-stage osteoarthritis, left knee, status post left total knee      arthroplasty.  2.  Acute blood loss anemia, secondary to surgery.  3.  Mild thrombocytopenia.  4.  Coronary artery disease, with a history of a myocardial infarction.  5.  Cardiac defibrillator.  6.  Mild chronic renal insufficiency.  7.  Degenerative disk disease of the lumbar spine.  8.  History of congestive heart failure in November 2003.  9.  History of transient ischemic attack x3 in the past.   SURGICAL PROCEDURE:  On December 17, 2003, the patient underwent a left  total knee arthroplasty by Dr. Dyke Brackett, assisted by Jamelle Rushing,  P.A.-C.  She had a Dupuy LCS complete metal back patella cemented, size  standard plus, with an LCS complete RPN size standard plus 10 mm thickness,  an NBT cemented keel tibial tray, cemented size 3, and an LCS complete  primary femoral component cemented size standard plus left.   COMPLICATIONS:  None.   CONSULTATIONS:  1.  Pharmacy consultation for Coumadin therapy on December 17, 2003.  2.  Physical therapy consultation on December 18, 2003.  3.  Cardiology  consultation on December 18, 2003.  4.  A rehabilitation medicine consultation on December 18, 2003.  5.  A case management consultation on December 22, 2003.   HISTORY OF PRESENT ILLNESS:  This 60 year old black female patient presented  to Dr. Lacretia Nicks. Dava Najjar with a 25-year-history of the gradual onset of  progressive left knee pain.  The pain is an intermittent, sharp and diffuse  sensation about the joint without radiation.  It increases with walking and  decreases with rest.  The knee catches and locks, gives way and swells.  She  has failed conservative treatment at this time, and because of that she is  presenting for a left knee replacement.   HOSPITAL COURSE:  The patient tolerated her surgical procedure well without  immediate postoperative complications.  She was subsequently transferred to  5000.  On postoperative day one she was afebrile.  Her vital signs were  stable with the exception of her blood pressure which was low.  Her left  leg  wound was benign.  Hemoglobin 11.1, hematocrit 33.7.  Her vital signs were  monitored.  She was started on therapy per protocol.  Dr. Colleen Can.  [facility]'s group followed her from a cardiovascular standpoint for the entire  hospitalization.  They saw her for the first time on December 18, 2003.  On postoperative day two, she was doing well.  Hemoglobin 7.9, hematocrit  23.3.  She was subsequently transfused with 2 units of packed red blood  cells.  Her vital signs were otherwise stable.  Blood pressure was still low  at 92/45.  On postoperative day three she continued to do well with therapy.  She did  complain of some chest heaviness and difficulty breathing, but on evaluation  and workup it was negative for any cardiac problem.  It just seemed to be  from some upper airway congestion.  O2 saturations were good.  The blood  pressure improved to 111/69.  Hemoglobin 11.3, hematocrit 32.4.  She was  continued on therapy.  It was noted at that  time that she had a low platelet  count at 116, so a heparin-induced thrombocytopenia panel was obtained, to  rule that out.  That was negative.   DISPOSITION:  She continued to make good progress over the next several  days, and progressed to the point where she would not quality for  rehabilitation.  It was felt that she was ready for discharge home on  December 22, 2003, and was subsequently discharged home at that time.   DISCHARGE INSTRUCTIONS:   DIET:  She can resume her regular pre-hospitalization diet.   DISCHARGE MEDICATIONS:  1.  She may resume her home medications, with the exception of no aspirin      for one week, and no Arthrotec while on the Coumadin.  Her home      medications include Lasix 40 mg p.o. q.o.d.  2.  Lisinopril 2.5 mg p.o. q.a.m.  3.  Protonix 40 mg p.o. q.a.m.  4.  Synthroid 0.1 mg p.o. q.a.m.  5.  Foltx 2.5 mg p.o. q.a.m.  6.  K-Dur 20 mEq p.o. q.o.d.  7.  Tri-Chlor 145 mg p.o. q.a.m.  8.  Allegra 180 mg p.o. q.a.m.  9.  Coreg 6.25 mg p.o. b.i.d.  10. Flonase nasal spray, two sprays in each naris q.a.m.  11. Pravachol 40 mg, one tab p.o. q.a.m.  12. Nitroglycerin tab 0.4 mg sublingual p.r.n.  13. Metolazone 2.5 mg p.o. q.a.m. p.r.n.  14. Coumadin 2.5 mg p.o. q.a.m.  She is to resume her normal home Coumadin      dose because her INR is now at the therapeutic level.  15. Percocet 5/325 mg, one to two p.o. q.4h. p.r.n. pain, #50, with no      refills.  16. Robaxin 500 mg, one to two tab p.o. q.6h. p.r.n. spasms, with no refill.   ACTIVITY:  She is to be out of bed, partial weightbearing of 50% or less on  the left leg, with the use of the walker.  She is to have home CPM 0-100  degrees for six to eight hours a day.  Home health PT and an R.N. from  [facility].  Please see the blue total knee discharge sheet  for further activity instructions.  WOUND CARE:  She is to keep the left knee incision clean and dry.  May  shower after no  drainage from the wound for two days.  Please see the blue  total knee discharge sheet for further wound care instructions.   FOLLOWUP:  She is to follow up with Dr. Madelon Lips in our office on Tuesday,  December 30, 2003, and needs to call [phone] to set up that appointment.   LABORATORY DATA:  A chest x-ray done on October 1. 2005, showed borderline  cardiomegaly.  On December 17, 2003, an MCV was 102.6.  It went to a high of 104.1 on  December 19, 2003, and then dropped down to 98.9 on December 21, 2003.  The  hemoglobin ranged from 14.2 on admission to 7.9 on December 19, 2003, to  10.4 on December 21, 2003.  The hematocrit ranged from 42.1 on seep 28, 2005,  to 23.3 on December 19, 2003, and 29.1 on December 21, 2003.  Platelets  ranged from 218 on December 17, 2003, to 127 on September 30th, 116 on  October 1st, and 134 on December 21, 2003.  On December 17, 2003, the PT was 12.7, INR 0.9.  On December 22, 2003, the PT  is 19.8, INR 2.1.  Glucose ranged from a low of 100 on December 20, 2003, to a  high of 184 on December 18, 2003.  BUN and creatinine ranged from 21.0 and  2.0 on December 17, 2003, to 11.0 and 1.6 on December 20, 2003.  Calcium  ranged from 9.4 on December 17, 2003, to 7.6 on December 20, 2003.  All other  laboratory studies were within normal limits.      Legrand Pitts   KED/MEDQ  D:  12/22/2003  T:  12/22/2003  Job:  [id number]   cc:   Colleen Can. Deborah Chalk, M.D.  Fax: [phone]

## 2010-08-06 NOTE — Procedures (Signed)
[facility]  Patient:    [name], [name]                         MRN: [id number] Proc. Date: 09/16/99 Adm. Date:  [id number] Attending:  Thyra Breed CC:         Sharlot Gowda., M.D.             Eric L. August Saucer, M.D.                           Procedure Report  PROCEDURE:  Facet joint injection on the left side at L5-S1 and L4-5.  DIAGNOSIS:  Lumbar spondylosis.  INTERVAL HISTORY:  The patient has done remarkably well since May of last year with her last injections. About 2-3 weeks ago, she had a recurrence of lower back discomfort radiating out into the lateral aspect of the left thigh. It is made worse by activity or prolonged sitting and improved by taking Arthrotec. She continues on her Arthrotec twice a day.  Her other medications include Synthroid, Lozol, Allegra, Cardizem, Flonase and osteobiflex.  ALLERGIES:  No known drug allergies.  PHYSICAL EXAMINATION:  VITAL SIGNS:  Blood pressure 148/66, heart rate 65, respiratory rate 12, O2 saturations 99%, pain level is 5/10 and temperature is 98.1.  Straight leg raise signs are negative. Deep tendon reflexes symmetric in the lower extremities. She has tenderness of the left lumbosacral junction. These areas were marked.  DESCRIPTION OF PROCEDURE:  After informed consent was obtained, the patient was taken to the fluoroscopy suite where she was placed in the prone position with a pillow under her abdomen. Mons were placed. Using fluoroscopic guidance, I identified lumbar vertebrae L1 through L5. She showed extensive degenerative changes at the lower lumber spine region. I identified the L4-5 and L5-S1 facet joint spaces fluoroscopically and optimized the visual beam to get the best visualization. The beam was fixed and the skin marked. The skin was prepped with Betadine x 3 and draped. Using a 25 gauge needle, I anesthetized the skin and subcutaneous structures with 2 cc of 1% lidocaine  at each site. A 25 gauge spinal needle was introduced down into the facet joints confirmed by lateral, oblique and AP projections. Aspiration was negative. I injected a 0.5 cc of 1% lidocaine at each level as a test dose with no evidence of spinal injection. 20 mg of Medrol and 1 cc of 1% lidocaine was injected at each level and the needle flushed with 1% lidocaine. The needles were removed intact. Her back was cleansed free of Betadine.  The patient was allowed to sit up. She noted marked attenuation in her pain.  CONDITION POST PROCEDURE:  Stable.  DISCHARGE INSTRUCTIONS: 1. Resume previous diet. 2. Limitations in activities per instruction sheet. 3. Continue on current medications. 4. Followup with me as needed for repeat facet joint injection. She has done    remarkably well on her current injections. DD:  09/16/99 TD:  09/18/99 Job: [id number]

## 2010-08-06 NOTE — H&P (Signed)
[name], [name] NO.:  [id number]   MEDICAL RECORD NO.:  [id number]          PATIENT TYPE:  INP   LOCATION:  2807                         FACILITY:  [facility]   PHYSICIAN:  Colleen Can. Deborah Chalk, M.D.DATE OF BIRTH:  1950/05/03   DATE OF ADMISSION:  06/07/2005  DATE OF DISCHARGE:                                HISTORY & PHYSICAL   CHIEF COMPLAINT:  Chest pain and syncope.   HISTORY OF PRESENT ILLNESS:  Patient is a 60 year old African-American  female who presents to emergency department this morning following a  syncopal episode around 4 a.m.  She also is complaining of substernal chest  pain this morning.  She does not report vasovagal symptoms prior to the  event.  There was not postictal state after the event.  The patient's  history is complicated by presence of an ICD/pacemaker and she is also on  Coumadin.  Currently her main concern is chest pain which occurs at rest, is  worsened with anterior wall palpation and arm movement.  Her history is also  complicated by recent death of her father thus creating large amounts of  increased stress in her life.   PAST MEDICAL HISTORY:  1.  Coronary artery disease status post stents x3 with two in the LAD and      one in an obtuse marginal.  She also has mild disease in left circumflex      and RCA per catheterization in 2004.  2.  She had an anterior MI in 2003.  3.  Ischemic cardiomyopathy with EF of less than 30%.  4.  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  5.  Hypertension.  6.  Hypothyroidism.  7.  Chronic renal insufficiency.  8.  History of tobacco abuse.  9.  History of TIAs requiring hospitalization.  10. Degenerative disk disease in the lumbar region.  11. Status post knee arthroplasty.   ALLERGIES:  PENICILLIN.   MEDICATIONS:  1.  Lisinopril 2.5 mg p.o. b.i.d.  2.  Metolazone 2.5 mg daily.  3.  Coreg 18.75 mg daily.  4.  Synthroid 100 mcg daily.  5.  KCl 20 mEq two tablets daily.  6.  Lasix 80 mg  b.i.d.  7.  Protonix 40 mg daily.  8.  Aspirin 325 daily.  9.  Foltx one tablet daily.  10. TriCor 145 daily.  11. Coumadin as directed per INR.  12. Flonase daily.  13. She apparently takes diclofenac occasionally as well for continued      arthritic pain and back pain.   FAMILY HISTORY:  Noncontributory.   SOCIAL HISTORY:  She has a remote tobacco history, occasional alcohol use,  but is otherwise noncontributory.   OBJECTIVE:  VITAL SIGNS:  Temperature:  She is afebrile.  Blood pressure  158/66, heart rate 76, respirations 19, saturation 99% on room air.  GENERAL:  Pleasant, alert and oriented x3.  No apparent distress.  HEENT:  No masses.  PERRL.  CHEST:  Clear to auscultation bilaterally.  Normal effort.  CARDIOVASCULAR:  Regular rate.  Normal S1 and S2.  No murmurs.  ABDOMEN:  Soft,  nontender, nondistended.  No masses.  No hepatosplenomegaly.  EXTREMITIES:  No edema.  +2 dorsalis pedis pulses bilaterally.   LABORATORY DATA:  White blood count 9, hemoglobin 12.5, hematocrit 36.6,  platelets 225.  MCV is 102.  CMET reveals sodium 136, potassium 4, chloride  103, bicarbonate 25, BUN 19, creatinine 1.8, glucose 180, AST 38, ALT 23,  alkaline phosphatase 39, total bilirubin 0.7, total protein 7.1, albumin 4.  A UDS is negative.  Point of care troponin I's are negative x2.  A TSH is  pending.   IMAGING:  Skull films are negative.  Chest x-ray is negative except for  cardiomegaly.   EKG reveals old anterior infarct with new ST elevation in anterior leads V2-  V4.   Pacemaker interrogation reveals no episodes and no discharges.   ASSESSMENT/PLAN:  60 year old African-American female with very concerning  cardiac history presents with syncope, chest pain, and new EKG findings.   Due to patient's prior history and new EKG changes will proceed immediately  to catheterization.  Patient was explained the risks and benefits of  treatment plan and agrees to plan.  Pending results  of catheterization  patient to proceed to either step-down unit or telemetry bed.  If  catheterization is negative then suspect likely increased chest or  musculoskeletal wall pain for cause of chest pain and EKG changes secondary  to unknown coronary and ischemic disease.   If catheterization is negative then we will continue home medicines as well  as continue her Coumadin and hope to have her discharged within the next day  or two so that she may take part in the proceedings of her father's funeral.   Dr. Deborah Chalk was present for evaluation of this patient, is in agreement with  plan.     Franchot Mimes, MD      Colleen Can. Deborah Chalk, M.D.  Electronically Signed   TV/MEDQ  D:  06/07/2005  T:  06/07/2005  Job:  [id number]

## 2010-08-06 NOTE — Cardiovascular Report (Signed)
[name], NEWCOMBE NO.:  [id number]   MEDICAL RECORD NO.:  [id number]          PATIENT TYPE:  INP   LOCATION:  2027                         FACILITY:  [facility]   PHYSICIAN:  Colleen Can. Deborah Chalk, M.D.DATE OF BIRTH:  June 17, 1950   DATE OF PROCEDURE:  06/07/2005  DATE OF DISCHARGE:  06/12/2005                              CARDIAC CATHETERIZATION   HISTORY:  Mrs. Wurtz has a known history of previous anterior myocardial  infarction.  She has a history of hypertension.  She presented with chest  pain and was referred for catheterization.   PROCEDURE:  Left heart catheterization with selective coronary angiography,  left ventricular angiography.   TYPE AND SITE:  Percutaneous right femoral artery.   CATHETERS:  6-French 4- curved Judkins right and left coronary catheter, 6-  French pigtail ventriculographic catheter.   CONTRAST MATERIAL:  Omnipaque.   MEDICATIONS GIVEN PRIOR TO PROCEDURE:  IV nitroglycerin drip and heparin.   MEDICATIONS DURING THE PROCEDURE:  Versed 3 mg IV.   COMMENTS:  The patient tolerated the procedure well.   HEMODYNAMIC DATA:  Aortic pressure of 116/68, LV was 111/4-5.  There was no  aortic valve gradient noted on pullback.   ANGIOGRAPHIC DATA:  1.  Left main coronary artery is normal.  2.  Left anterior descending is a long vessel that wraps around the apex.      There is a stent in the main portion of the proximal portion of the left      anterior descending between the first and second diagonal vessels.  The      stent appears to be widely patent.  The second diagonal vessel has what      appears to be a 90% ostial stenosis.  This arises right at the very      distal end of the stent.  The left anterior descending itself crosses      the apex is free of significant disease.  3.  Left circumflex.  The left circumflex has a large bifurcating marginal      system.  There is 30-40% narrowing before the stent in the marginal      vessel.   However, flow appears to be excellent.  There does not appear      to be any significant obstructive disease.  The flow in the marginal is      somewhat hypodense but does not appear to have luminal constriction.  4.  Right coronary artery.  Right coronary artery is small but dominant      vessel.  It has 30-40% narrowings throughout the proximal portion but is      free of significant obstructive disease.   LEFT VENTRICULAR ANGIOGRAM:  Left ventricular angiogram was performed in the  RAO position.  Overall cardiac size is normal.  There was anterior apical  akinesia with a mild aneurysmal bulging at the anterior and inferior  portions.  There does appear to be mild dyskinesia.  The global ejection  fraction would be estimated to be 25%.  It is pertinent to note that the ICD  lead is  in the right ventricular apex in appropriate position.   OVERALL IMPRESSION:  1.  Severe left ventricular dysfunction with anterior apical aneurysmal      dissection with global ejection fraction of 25%.  2.  Severe stenosis in a small diagonal vessel located at the distal end of      the stent in the left anterior descending but with persistent patency of      the stents in the left anterior descending and left circumflex with mild      to moderate disease in the right coronary artery and left circumflex      systems.   DISCUSSION:  It is felt that the Mrs. Mattera is stable at this point and time  and will need to be managed medically.      Colleen Can. Deborah Chalk, M.D.  Electronically Signed     SNT/MEDQ  D:  09/16/2005  T:  09/16/2005  Job:  [id number]

## 2010-08-06 NOTE — Op Note (Signed)
NAMEWRENLEY, [name]                          ACCOUNT NO.:  [id number]   MEDICAL RECORD NO.:  [id number]                   PATIENT TYPE:  INP   LOCATION:  2035                                 FACILITY:  [facility]   PHYSICIAN:  Duke Salvia, M.D.               DATE OF BIRTH:  January 11, 1951   DATE OF PROCEDURE:  07/31/2003  DATE OF DISCHARGE:                                 OPERATIVE REPORT   PREOPERATIVE DIAGNOSIS:  Ischemic cardiomyopathy with ejection fraction of  29%.   POSTOPERATIVE DIAGNOSIS:  Ischemic cardiomyopathy with ejection fraction of  29%.   PROCEDURE:  Defibrillator implantation with intraoperative defibrillation  threshold testing.   Following the obtaining of informed consent, the patient was brought to the  electrophysiology laboratory and placed on the fluoroscopic table in the  supine position.  After routine prep and drape of the left upper chest,  lidocaine was infiltrated in the prepectoral subclavicular region and an  incision was made and carried down to the layer of the prepectoral fascia  using the electrocautery and sharp dissection.  A pocket was formed  similarly, hemostasis was obtained.   Thereafter attention was turned to gaining access to the extrathoracic left  subclavian vein, which was accomplished with modest difficulty but without  the aspiration of air or puncture of the artery.  A guidewire was placed and  retained and a 9.5 [place] hemostatic tear-away introducer sheath was placed,  through which passage was allowed for a Guidant 0185 Reliant G dual-coil  active-fixation defibrillation lead, serial number [id number].  Under  fluoroscopic guidance it was manipulated into the right ventricular apex.  It turned out that the right ventricle was surprisingly small and the heart  was significantly vertical, so in multiple projections the lead did not look  like it was extending all that far but further manipulations to get it  further to a more apical  location failed.   Through the device the bipolar R-wave was 9 mV with a pacing impedance of  612 Ohms and a pacing threshold of 0.8 V at 0.5 msec.  Current at threshold  was 1.6 mA, and there was no diaphragmatic pacing at 10 volts.   This lead was then secured to the prepectoral fascia and attached to a  Guidant Vitality DS T135VR ICD, serial number [id number].  Through the device  the bipolar R-wave was 13.9 mV with a pacing impedance of 494 Ohms and a  pacing threshold of 0.6 V at 0.5 msec.  High voltage impedance was 41 Ohms.   With these acceptable parameters recorded, the leads and the pulse generator  were placed in the pocket and secured to the prepectoral fascia.  The wound  was copiously irrigated with antibiotic-containing saline solution and  hemostasis was ensured.   Ventricular fibrillation was induced via the T-wave shock.  After a total  duration of 4 seconds, a 14-joule shock was delivered  through a measured  resistance of 39 Ohms, failing to terminate ventricular fibrillation.  After  a total duration of 14 seconds, a 21-joule shock was delivered through a  measured resistance of 39 Ohms, terminating ventricular fibrillation and  restoring sinus rhythm.   After a wait of five to six minutes,ventricular fibrillation was reinduced.  After a total duration of 8 seconds, a 21-joule shock was delivered through  a measured resistance of 40 Ohms, terminating ventricular fibrillation and  restoring sinus rhythm.  With these acceptable parameters recorded, the  system was implanted.  The wound was closed in three layers in the normal  fashion.  The wound was washed, dried, and a Benzoin and Steri-Strip  dressing was applied.  The Steri-Strips were left unapposed and a silicone  topical dressing was applied.  The hope is to reduce keloid formation.   The patient tolerated the procedure without apparent complication.                                               Duke Salvia, M.D.    SCK/MEDQ  D:  07/31/2003  T:  08/01/2003  Job:  [id number]

## 2010-08-06 NOTE — H&P (Signed)
[name], [name]                          ACCOUNT NO.:  [id number]   MEDICAL RECORD NO.:  [id number]                   PATIENT TYPE:  INP   LOCATION:  2025                                 FACILITY:  [facility]   PHYSICIAN:  Colleen Can. Deborah Chalk, M.D.            DATE OF BIRTH:  10/25/1950   DATE OF ADMISSION:  06/12/2002  DATE OF DISCHARGE:                                HISTORY & PHYSICAL   CHIEF COMPLAINT:  Sore throat and runny nose with facial edema.   HISTORY OF PRESENT ILLNESS:  The patient is a very pleasant 60 year old  black female who has had recent anterior myocardial infarction suffered in  August 2003.  At that time she was treated with stenting of the left  anterior descending and subsequently underwent stent placement to the left  circumflex.  She has had known reduction of LV function and has been  maintained on Carvedilol, ACE inhibitor, as well as diuretic therapy.  She  has basically done well since her myocardial infarction but has had numerous  bouts of upper respiratory infections, tooth infections, as well as  sinusitis.   She presents to our office on 06/11/02 with complaints of a runny nose and  sore throat that started this past Sunday.  Several of her co-workers had  been sick as well.  By the time of her office appointment, though, she began  having facial edema as well as erythema.  She was subsequently started on  Zithromax and was given 500 mg on Tuesday.  She was called back into the  office on 06/12/02 and really has had no notable improvement.  If anything,  her symptoms have worsened.  She has edema somewhat in the upper neck region  as well.  She has no shortness of breath.  She is subsequently admitted for  IV antibiotics and further medical evaluation.   PAST MEDICAL HISTORY:  1. 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  2.  Hyperlipidemia.  3. History of LV dysfunction.  4. 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  5. Previous back surgery.  6. Hypertension.  7. Osteoporosis.  8. Glucose intolerance.  9. Past tobacco abuse.   ALLERGIES:  PENICILLIN.   MEDICATIONS:  1. Protonix 40 mg a day.  2. Aspirin daily.  3. Lasix 40 mg a day.  4. Coumadin 7.5 mg x1, 5 mg x6.  5. Potassium daily.  6. Coreg 12.5 mg in the morning, 6.25 mg in the evening.  7. Lisinopril 2.5 mg b.i.d.  8. Pravachol 40 mg a day.  9. Allegra daily.  10.      Synthroid daily.  11.      Flonase daily.   FAMILY HISTORY:  Unchanged.   SOCIAL HISTORY:  Unchanged.   REVIEW OF SYSTEMS:  As  noted above and otherwise unremarkable.   PHYSICAL EXAMINATION:  GENERAL:  She is a young black female, somewhat  distressed.  She has no obvious dyspnea.  VITAL SIGNS:  She is afebrile.  Weight is 151 pounds.  Blood pressure 110/80  sitting and standing, heart rate 56.  HEENT:  The face shows a moderate amount of edema as well as erythema.  There are multiple open areas and honeycomb-like places with questionable  impetigo.  NECK:  Somewhat swollen.  CHEST:  Lungs are basically clear.  CARDIAC:  Heart shows a regular rhythm.  ABDOMEN:  Soft, positive bowel sounds, nontender.  EXTREMITIES:  Without edema.  NEUROLOGIC:  Intact.  There are no gross focal deficits.   LABORATORY DATA:  Labs are pending.   OVERALL IMPRESSION:  1. Questionable cellulitis/erysipelas of the face.  2. PENICILLIN allergy.  3. Recent anterior myocardial infarction with revascularization.  4. Left ventricular dysfunction.  5. Chronic Coumadin for apical aneurysm.  6. Hypertension, currently well-controlled.  7. Hyperlipidemia, currently on Pravachol.   PLAN:  1. She will be admitted to the hospital.  2. ID consult will be obtained.  3.     She will be begun on IV vancomycin per pharmacy protocol.  4. Her home medicines will be continued as well.  5. Will check  the blood culture as well as urinalysis.     Juanell Fairly C. Earl Gala, N.P.                 Colleen Can. Deborah Chalk, M.D.    LCO/MEDQ  D:  06/12/2002  T:  06/12/2002  Job:  [id number]   cc:   Merlene Laughter. Renae Gloss, M.D.  [address]  [address]  [place]  [place] [address]  Fax: [phone]   Cliffton Asters, M.D.  [address] [place]  [place] [address]  Fax: [phone]

## 2010-08-06 NOTE — H&P (Signed)
[name], [name]                          ACCOUNT NO.:  [id number]   MEDICAL RECORD NO.:  [id number]                   PATIENT TYPE:  INP   LOCATION:  2928                                 FACILITY:  [facility]   PHYSICIAN:  Francisca December, M.D.               DATE OF BIRTH:  December 13, 1950   DATE OF ADMISSION:  02/07/2002  DATE OF DISCHARGE:                                HISTORY & PHYSICAL   PRIMARY CARE Mase Dhondt:  Lind Guest. August Saucer, M.D.   IMPRESSION:  1. Acute pulmonary edema secondary to ischemic left ventricular dysfunction     (ejection fraction approximately 25% by echocardiogram today).  Known     coronary artery disease, status post acute anterior myocardial infarction     November 03, 2001 with subsequent stent proximal left anterior descending     and within a week stent circumflex as part of completion of staged     procedure.  She has had good diuresis, approximately 2200 mL after a     total of 160 intravenous Lasix over the last 10 hours.  Her first set of     cardiac enzymes were negative and second set are pending.  Denies chest     pain nor discomfort; she did have a salted meal a few hours before onset     of symptoms and usually she follows a no added salt diet.  Her     electrocardiogram is suggestive of an anterior injury pattern; question     lateral ischemia.  2. Systemic anticoagulation secondary to prior anterior myocardial     infarction, management through Dr. Ronnald Nian office.  Her coags are     pending.  3. History of hypertension, good control currently.  4. History of hypothyroidism on supplements.  5. Mild renal insufficiency, creatinine 1.4.   PLAN:  1. Continue intravenous Lasix 100 mg intravenously q.12h.  Daily weights,     fluid restriction 1600 mL per day and check intake and output.  Followup     BMET and supplement potassium p.r.n.  2. Followup serial cardiac enzymes.  3. Continue beta-blocker, aspirin.  4. Hold heparin/Lovenox pending coag  results.  5. Hold Coumadin; question need for cardiac catheterization pending Dr.     Harvie Bridge review (who is covering for Dr. Deborah Chalk).  6. Consider initiation of ACE inhibitor; has been on this in the past but     this was discontinued because of hypotension.  7. Hold Plavix for now, may be coronary artery bypass graft candidate.  8. Followup chest x-ray results and serial BNP.   HISTORY OF PRESENT ILLNESS:  The patient is a very pleasant 60 year old  female with history of acute anterior MI November 03, 2001 with subsequent  HepaCoat stent proximal LAD; then stent (Cypher) left circumflex November 09, 2001 as part of a staged procedure.  Echocardiogram November 05, 2001 revealed  EF 35%-45% with severe hypokinesis  mid to distal anterior wall, akinesis  apical/periapical wall.  Mild LVH.  She is status post recent hospital  admission November 25, 2001 to November 27, 2001 diagnosed with arthralgia  secondary to PENICILLIN allergy, which had been prescribed for tooth  abscess.  Recent office visit with Dr. Deborah Chalk, she was started on  Pravachol.  She did have discharge three days earlier.   After a salty meal, she subsequently developed acute symptoms of pulmonary  edema with marked shortness of breath and cough productive of white phlegm.  Denied hemoptysis.  Denied chest pain nor tightness.  She summoned EMS, who  administered 100 mg of IV Lasix.  Blood pressure was 160 systolic.  Her  saturations were low at 78%.  She was given increments of morphine, a total  of 10 mg, IV with subsequent improvement in saturations to 94%.  She was  also started a lidocaine bolus 80 mg and drip for frequent PVC's.  She  presented to [facility] ER with improvement in breathing, given an additional 60  mg IV Lasix.  She has had a total of 2400 mL by the output over the last 10  hours.  Her labs are significant for elevated BNP 449, first set of cardiac  enzymes were negative.  EKG revealed old inferior/anterior  septal MI.  Initial ST elevation V5-V6, now improved.  She developed ST anterior/septal  leads the morning of February 08, 2002.  Currently, breathing is almost at  her baseline, still mildly dyspneic with conversation.  Saturations are 95%  on 4 L nasal cannula.   PAST MEDICAL HISTORY:  1. Coronary atherosclerotic heart disease:     a. (November 03, 2001) Acute anterior MI with subsequent stent proximal        LAD.  Subsequent stent CFX as staged procedure November 10, 2001.     b. A 2-D echocardiogram November 05, 2001 revealed EF 35%-45%.  Anterior        hypokinesis, mid to distal anterolateral wall; AK apical/periapical        wall.  2. History of tobacco abuse, quit February 2003.  Prior one pack per week.  3. Status post lumbar laminectomy by Dr. Phoebe Perch August 2003 (prior to her     MI).  Residual left foot numbness.  4. Arthroscopic left knee surgery, open right knee surgery.  5. Thyroidectomy in 1970s secondary to hypothyroidism.  6. Tonsillectomy.  7. Hypertension for 25 years.  8. Dyslipidemia; recently started on Pravachol.  9. A broken right rib a few years earlier.  10.      GERD on Protonix with relief.   ALLERGIES:  1. DUST.  2. [place] NUTS causing anaphylactic reaction.  3. PENICILLIN causing marked myalgias.   MEDICATIONS:  1. Lasix 20 mg p.o. q.d.  2. Synthroid 0.1 mg p.o. q.d.  3. Pravachol 20 mg p.o. q.d.  4. Protonix 40 mg p.o. q.d.  5. Potassium supplement 20 mEq p.o. q.d.  6. Aspirin 325 mg p.o. q.d.  7. Coumadin 5 mg p.o. q.d.  8. Metoprolol 50 mg 1/2 tablet p.o. b.i.d.   SOCIAL HISTORY:  ETOH:  Social use, a couple cocktails.  Tobacco:  Quit  February 2003.  She is widowed without children.  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   FAMILY HISTORY:  Father is in good health, age 10.  He lives with his son.  Mother died at age 73 of renal failure.  She had diabetes.  One  brother  alive and well.  REVIEW OF SYSTEMS:  As in HPI and  past medical history.  Otherwise, denies  problems with lightheadedness, dizziness, syncope, nor near-syncopal  episodes.  Hearing and vision okay.  Negative dysphagia.  Good control of  GERD on Protonix.  Tendency towards loose stool.  Denies constipation.  Negative melena nor bright red blood PR.  Negative dysuria nor hematuria.  Numbness, status post lumbar laminectomy August 2003.  Denies pedal edema.   PHYSICAL EXAMINATION:  VITAL SIGNS:  Blood pressure 152/46, currently  130/82.  Heart rate 90, respiratory rate 16, temperature 98.0.  GENERAL:  She is a well-nourished, pleasantly conversant 60 year old female  in no acute distress at time of evaluation.  HEENT:  Brisk bilateral carotid upstroke with mild JVD.  Negative bruit.  CHEST:  Bibasilar and expiratory rales.  CARDIAC:  Regular rate and rhythm.  Positive gallop.  ABDOMEN:  Soft, nondistended.  Normoactive bowel sounds.  Negative abdominal  aortic, renal, femoral bruits.  Nontender to applied pressure.  No masses,  no organomegaly appreciated.  EXTREMITIES:  +2/4 bilateral dorsalis pedis and right posterior tibial; 0/4  left posterior tibial.  GENITORECTAL:  Deferred.  NEUROLOGIC:  Grossly nonfocal.  Alert and oriented x3.   LABORATORY TESTS AND DATA:  Sodium 145, potassium 3.8, chloride 104, CO2 29,  BUN 13, creatinine 1.4, glucose 132.  Hemoglobin 11.2, hematocrit 33.7, WBC  7.2, platelets 215.  CK 94, MB fraction 2.5, troponin-I 0.05.  Elevated BNP  at 449.     Salomon Fick, N.P.                       Francisca December, M.D.    MES/MEDQ  D:  02/08/2002  T:  02/08/2002  Job:  [id number]   cc:   Minerva Areola L. August Saucer, M.D.  [address] [address]  [place]  [place] [address]  Fax: [phone]

## 2010-08-06 NOTE — Op Note (Signed)
[name], [name]                          ACCOUNT NO.:  [id number]   MEDICAL RECORD NO.:  [id number]                   PATIENT TYPE:  AMB   LOCATION:  ENDO                                 FACILITY:  [facility]   PHYSICIAN:  Petra Kuba, M.D.                 DATE OF BIRTH:  [birth year]   DATE OF PROCEDURE:  03/05/2003  DATE OF DISCHARGE:                                 OPERATIVE REPORT   PROCEDURE:  Esophagogastroduodenoscopy.   INDICATIONS FOR PROCEDURE:  Chronic anemia, nondiagnostic colonoscopy, on  aspirin, nonsteroidals pending.   CONSENT:  Consent was signed after risks, benefits, methods, and options  were thoroughly discussed in the office.   ADDITIONAL MEDICATIONS USED:  None.   PROCEDURE:  The video endoscope was inserted by direct vision.  The  esophagus was normal.  The scope was advanced into the stomach and advanced  to the antrum pertinent for some minimal antritis and advanced to a normal  pylorus, normal duodenal bulb, and around the C-loop to a normal second  portion of the duodenum.  No blood was seen distally.  The scope was  withdrawn back to the bulb and a good look there ruled out ulcers in that  location.  The scope was withdrawn back into the stomach and retroflexed.  The angularis, cardia, fundus, lesser and greater curve were all normal.  Straight visualization of the stomach did not reveal any additional findings  other than the mild antritis mentioned above.  The air was suctioned, the  scope was slowly withdrawn.  Again, a good look at the esophagus was normal.  The scope was removed.  The patient tolerated the procedure well.  There was  no obvious immediate complications.   ENDOSCOPIC DIAGNOSIS:  1. Minimal antritis.  2. Otherwise, normal EGD.   PLAN:  Follow up p.r.n. or in two months to recheck symptoms, probably CBC,  possibly guaiacs to make sure no further workup plans are needed like a  possible one time small bowel follow through capsule  endoscopy, etc.  I will  be happy to see back sooner p.r.n.                                               Petra Kuba, M.D.    MEM/MEDQ  D:  03/05/2003  T:  03/05/2003  Job:  [id number]   cc:   Colleen Can. Deborah Chalk, M.D.  Fax: [phone]

## 2010-08-06 NOTE — H&P (Signed)
NAMEDARRELLE, BARRELL                          ACCOUNT NO.:  [id number]   MEDICAL RECORD NO.:  [id number]                   PATIENT TYPE:  INP   LOCATION:  3701                                 FACILITY:  [facility]   PHYSICIAN:  Corliss Marcus, M.D.                  DATE OF BIRTH:  Apr 20, 1950   DATE OF ADMISSION:  11/25/2001  DATE OF DISCHARGE:                                HISTORY & PHYSICAL   REASON FOR ADMISSION:  Chest pain.   IMPRESSIONS:  1. Chest discomfort, similar to recent myocardial infarction pain.  2. Arteriosclerotic cardiovascular disease, two vessel, status post a recent     anterior wall myocardial infarction, November 03, 2001.     a. Status post Hepacoat stent LAD, November 03, 2001.     b. Status post Cypher stent RCA November 09, 2001.     c. Mild LV dysfunction, EF 40%, catheterization November 03, 2001.  3. Degenerative joint disorder, was on Arthrotec, 75 b.i.d. until November 03, 2001.  4. Hypertension, not recently a problem.  5. Osteoporosis.  6. Glucose intolerance.  7. Past history of tobacco abuse.  8. Gastroesophageal reflux disease.  9. New onset multiple arthralgias.  10.      Recent right-sided sinusitis, placed on amoxicillin.   PLAN:  1. Admit to observation, rule out MI protocol. Serial CK-MB as well as     troponin enzymes x 2.  2. Hold amoxicillin, consider Azithromycin for recent sinusitis.  3. Repeat EKG.  4. Further evaluation per Dr. Roger Shelter.  5. Hypothyroidism.   HISTORY OF PRESENT ILLNESS:  The patient is a 60 year old African-American  woman who awoke this morning with diffuse arthralgias of the  knee, hips and  shoulders (right greater than left), hands and wrists, as well as mild  substernal chest discomfort described as a sensation of something caught in  her esophagus. This was reminiscent of her recent MI pain, but milder. It  persisted until she received sublingual and IV nitroglycerin in the  emergency room around 15 to  1600. At the time of  my evaluation at 2100, she  is without significant any chest discomfort. Yesterday she felt well without  chest pain or arthralgias. She awoke with these symptoms this morning and  had trouble walking due to her knee pain. She did receive amoxicillin  beginning November 23, 2001, for sinusitis and had been taking Arthrotec 75  b.i.d. up until August16, 2003, for DJD.   PAST MEDICAL HISTORY:  As above.   SOCIAL HISTORY:  The patient is not married. She has no children. She lives  by herself here in [place]. 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 She no  longer uses tobacco products. She does not  drink.,   FAMILY HISTORY:  Noncontributory   MEDICATIONS ON ADMISSION:  1. Protonix 40 mg p.o. q.d.  2. Plavix 75 mg p.o. q.d.  3. Coated aspirin 325 mg p.o. q.d.  4. Metoprolol 25 mg p.o. b.i.d.  5. Furosemide 40 mg p.o. q.d.  6. Potassium supplement 20 mEq p.o. q.d.  7. __________ 0.4 mg p.r.n.  8. Synthroid 0.l mg p.o. q.d.   She had Altace discontinued and metoprolol decreased from 50 b.i.d. on  November 23, 2001, due to hypotension.   REVIEW OF SYSTEMS:  She denies any recent fever or chills, coughs, sputum  production, lower extremity edema, orthopnea, PND. She has had no calf  or  leg pain. She has not had headaches or a history of syncope. She has had no  sore throat, no difficulty swallowing. She denies any abdominal pain, black  tarry stools, difficulty urinating, dysuria or hematuria. She  has had no  skin rash. She has had no swelling. She  has had the joint pain as mentioned  above.   PHYSICAL EXAMINATION:  VITAL SIGNS:  Blood pressure is 112/68, pulse 76,  respirations 16, temperature 97.4 and  O2 saturation on room air 100%.  GENERAL:  This is a thin, pleasant African-American woman in no distress.  She is unable to raise her right arm due to shoulder  pain.  HEENT:  Unremarkable. Her head is normocephalic, atraumatic.   Pupils equally  round and reactive to light and accomodation.  Extraocular movements intact.  Sclerae anicteric. Oral mucosa is pink and moist. The tongue was not coated.  NECK:  Supple without thyromegaly or masses.  The carotid upstrokes were  normal.  There is no bruit. There is  no jugular venous  distention.  CHEST:  Clear with adequate excursion,  normal vesicular breath sounds are  heard throughout. The precordium is quiet.  Normal S1, S2, no S3, S4,  murmur, click or rub noted. The PMI is a left ventricular apex, rather  permanent, sustained throughout and nondisplaced.  ABDOMEN:  Soft, flat and nontender without hepatosplenomegaly or midline  pulsatile masses. Bowel sounds are present in all quadrants.  GU:  The external genitalia are within normal limits.  Vaginal  and rectal  examination not performed.  EXTREMITIES:  Full range of motion with the exception of the right shoulder.  She has no edema in the lower extremities. The distal pulses are intact. I  could detect no effusion or increased warmth over the knees, shoulders,  elbows, wrists or hands. There is no fullness or edema. There is tenderness  over both wrists and the DIP and MCP  joints of her hands, also both wrists. The right appears to be more affected  than the left.  NEUROLOGIC:  Cranial nerves II through XII intact. Motor and sensory are  grossly intact. Gait not tested.  SKIN:  Was warm and dry and clear.   LABORATORY DATA:  Hemoglobin 10.9, hematocrit 32.1. CK  63, MB 1.2, troponin  0.05.  Serum electrolytes were within normal limits.  BUN 8, creatinine 1.4,  glucose 108.   An electrocardiogram shows anterior and inferior infarctions. There is  deduced T-wave inversions. There is no significant change compared to a  prior tracing on November 09, 2001.  A chest x-ray  per the ER physician  review is without acute cardiopulmonary changes.  COMMENTS:  It is difficult to identify a likely etiology for this  diffuse  arthralgia syndrome. It may be related to amoxicillin since that is a new  drug started, although I have not seen a reaction similar to this. It may  also be a late  reaction due to her having  discontinued Arthrotec, but this was over three  weeks ago. One would think that her symptoms would have returned more  prominently prior to this. Will leave further assessment and management of  this problem to Dr. Delfin Edis and any consultant he wishes to contact.                                                Corliss Marcus, M.D.    JE/MEDQ  D:  11/25/2001  T:  11/26/2001  Job:  [id number]   cc:   Minerva Areola L. August Saucer, M.D.

## 2010-08-06 NOTE — Cardiovascular Report (Signed)
NAMEAVRYL, [name]                          ACCOUNT NO.:  [id number]   MEDICAL RECORD NO.:  [id number]                   PATIENT TYPE:  OIB   LOCATION:  2858                                 FACILITY:  [facility]   PHYSICIAN:  Colleen Can. Deborah Chalk, M.D.            DATE OF BIRTH:  10/17/1950   DATE OF PROCEDURE:  10/07/2002  DATE OF DISCHARGE:                              CARDIAC CATHETERIZATION   HISTORY:  Ms. Balon is referred for followup after evaluation of stent  placement because of recurrent congestive heart failure.  She had previous  anterior myocardial infarction in August 2003.   PROCEDURE:  Left heart catheterization with selective coronary angiography,  left ventricular angiography, stent placement in the mid left anterior  descending.   TYPE AND SITE OF ENTRY:  Percutaneous, right femoral artery.   CATHETERS:  6 French 4 curved Judkins right and left coronary catheters; 6  French pigtail ventriculographic catheter.   CONTRAST MATERIAL:  Omnipaque.   MEDICATIONS GIVEN PRIOR TO PROCEDURE:  Valium 10 mg p.o.   MEDICATIONS GIVEN DURING THE PROCEDURE:  Versed 4 mg IV, Angiomax and  Plavix.   COMMENTS:  The patient tolerated the procedure well.   HEMODYNAMIC DATA:  1. The aortic pressure was 103/10/17.  2. __________ pressure was 102/56.  3. There is no aortic valve gradient noted on pullback.   ANGIOGRAPHIC DATA:  1. The left main coronary artery is normal.  2. Left circumflex has two large marginal vessels.  The stent is widely     patent in the first marginal.  3. Left anterior descending is a long vessel that wraps around the apex.     There is a diagonal vessel that arises after the stent.  There is a     napkin-ring severe 95% focal stenosis at the origin of the stent.  There     is a segmental 50% narrowing between the stent and the largest of the     septal perforating branches.  4. Right coronary artery:  The right coronary artery is a small to moderate   size vessel with irregularities, but no significant focal stenosis.   LEFT VENTRICULAR ANGIOGRAM:  Left ventricular angiogram was performed in the  RAO position.  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 There was no clot noted in the aneurysm  and the wall was smooth.  The inferior wall and anterior wall contracted  reasonably well.  There was swirling of contrast within the aneurysmal  portion.  There was no significant mitral regurgitation noted otherwise.  The global ejection would be 25%.   ANGIOPLASTY PROCEDURE:  We elected to proceed on with angioplasty and stent  placement in the proximal left anterior descending proximal to the stent.  A  2.5 x 13-mm Cypher stent was placed.  It was inflated to maximum 14  atmospheres.  We covered the septal  perforating branch  and had persistent  patency of this vessel.  The patient tolerated the procedure well.  A 7  [place] JL-4 guide and Hi-Torque Floppy guide wire were used and it was  primary stenting performed with the 2.5 x 13-mm Cypher stent.   OVERALL IMPRESSION:  1. 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  2. Severe stenosis in the mid left anterior descending just proximal to the     previous stent placement with successful repeat percutaneous intervention     and placement of a 2.5 x 13-mm Cypher stent.  3. Mild coronary atherosclerosis in the right coronary artery and left     circumflex systems.                                                 Colleen Can. Deborah Chalk, M.D.    SNT/MEDQ  D:  10/07/2002  T:  10/07/2002  Job:  [id number]

## 2010-08-06 NOTE — Op Note (Signed)
NAMEANNEKE, CUNDY NO.:  [id number]   MEDICAL RECORD NO.:  [id number]          PATIENT TYPE:  INP   LOCATION:  2899                         FACILITY:  [facility]   PHYSICIAN:  Duke Salvia, M.D.  DATE OF BIRTH:  May 31, 1950   DATE OF PROCEDURE:  DATE OF DISCHARGE:                                 OPERATIVE REPORT   PREOPERATIVE DIAGNOSIS:  [name] has a previously implanted ICD for  primary prevention in the setting of ischemic heart disease.  She has a  device that is subject to advisory recall with evidence of premature battery  depletion.  She was admitted for elective device change out for the above.   POSTOPERATIVE DIAGNOSIS:  [name] has a previously implanted ICD for  primary prevention in the setting of ischemic heart disease.  She has a  device that is subject to advisory recall with evidence of premature battery  depletion.  She was admitted for elective device change out for the above.   PROCEDURES:  Explantation of a previously-implanted device, pocket revision,  implantation of a new device, and intraoperative defibrillation and  threshold testing.   DESCRIPTION OF PROCEDURE:  Upon obtaining informed consent, the patient was  brought to the electrophysiology laboratory and placed on the fluoroscopic  table in the supine position.  After routine prep and drape of the left  upper chest, lidocaine was infiltrated along the line of the previous  incision and carried down to the layer of the device pocket using  electrocautery and sharp dissection.  The pocket was opened.  The device was  explanted.   The pocket was then revised to house the new device which was not  inconsequentially bigger.   Interrogation of the previously-implanted [phone] Gore-Tex dual-coil  defibrillator lead demonstrated an R wave of 13 with impedance of 473, a  threshold of 1 volt with impedance of 0.9.  Proximal coil impedance was  about 480.  Distal coil  impedance was about 520.   These leads were then secured to a Guidant Vitality 2EL ICD, Serial #[id number]  [id number].  Through the device, the bipolar R wave was 12.5 mV with a pacing  impedance of 398 ohms and threshold of 1 volt at 0.5 msec with a high  voltage impedance of 41 ohms.   At this point, defibrillation threshold testing was undertaken.   Ventricular fibrillation was induced via the T-wave shock.  After a total  duration of 6 seconds, a 21 joule shock was delivered through a measured  resistance of 35 ohms failing to terminating ventricular fibrillation.  A  360 joule shock was delivered externally to restore sinus rhythm.   After a wait of 5-6 minutes, ventricular fibrillation was re-induce via the  T-wave shock.  After a total duration of 6.5 seconds, a 21 joule shock was  delivered through a measured resistance of 35 ohms delivered with REVERSED  POLARITY, terminating ventricular fibrillation and restoring sinus rhythm.  At this point, the device was implanted.  The pocket was copiously irrigated  with antibiotic-containing saline solution.  Hemostasis was assured.  The  lead and the pulse generator were secured to the prepectoral fascia, and the  wound was closed in three layers in a normal  fashion.  The wound was washed, dried, and a Benzoin dressing was applied.  We then applied a silicone patch as this had been used at her previous  implant to abrocate the development of keloid formation which it had done  very successfully.           ______________________________  Duke Salvia, M.D.     SCK/MEDQ  D:  11/05/2004  T:  11/05/2004  Job:  [id number]   cc:   Colleen Can. Deborah Chalk, M.D.  Fax: [phone]   Merlene Laughter. Renae Gloss, M.D.  [address]  [address]  [place]  [place] [address]  Fax: [phone]

## 2010-08-06 NOTE — Telephone Encounter (Signed)
PT HAS AN UPCOMING SURGERY IN July AND NEEDS TO TALK TO KELLY ABOUT CLEARANCE.

## 2010-08-06 NOTE — Op Note (Signed)
[name], [name] NO.:  [id number]   MEDICAL RECORD NO.:  [id number]          PATIENT TYPE:  INP   LOCATION:  2873                         FACILITY:  [facility]   PHYSICIAN:  Clydene Fake, M.D.  DATE OF BIRTH:  1950-08-08   DATE OF PROCEDURE:  12/20/2004  DATE OF DISCHARGE:                                 OPERATIVE REPORT   PREOPERATIVE DIAGNOSIS:  Lumbar stenosis, spondylosis, spondylolisthesis.   POSTOPERATIVE DIAGNOSIS:  Lumbar stenosis, spondylosis, spondylolisthesis.   OPERATION PERFORMED:  X-Stop placement at L3-4 and L4-5 with intraoperative  fluoroscopy.   SURGEON:  Clydene Fake, M.D.   ASSISTANT:   ANESTHESIA:  General endotracheal.   ESTIMATED BLOOD LOSS:  Minimal.   BLOOD REPLACED:  None.   DRAINS:  None.   COMPLICATIONS:  None.   INDICATIONS FOR PROCEDURE:  The patient is a 60 year old woman with previous  TIAs, coronary artery disease, on chronic Coumadin with severe back and leg  problems, neurogenic claudication, found to have stenosis, unstable  spondylolisthesis, lumbar spine.  He underwent cardiac clearance and prior  to this and off Coumadin for a week for X-Stop placement for stenosis and  neurogenic claudication symptoms.   DESCRIPTION OF PROCEDURE:  The patient was brought to the operating room.  General anesthesia was induced.  The patient was placed in prone position on  the Wilson frame with all pressure points padded. The patient was prepped  and draped in sterile fashion. Site of incision was injected with 26 mL of  one half 0.5% Xylocaine with epinephrine and one half 0.25% Marcaine plain.  Incision was then made from the previous incision going cephalad.  This was  taken down to the fascia. Hemostasis was obtained with Bovie cauterization.  The fascia was incised over one spinous process and subperiosteal dissection  was done and the markers were placed above and below the spinous process and  the interspinous  ligament and fluoroscopy was used showing we were at the 2-  3 and 3-4 level.  We continued our dissection inferiorly leaving the  interspinous ligaments and exposing the 3-4 and 4-5 interspinous ligaments.  She had very large facets with calcifications coming up off __________ side  at 3-4 and part of this needed to be removed. It was actually loose.  This  calcification was up over the facet.  We then used awl first small then the  larger one to _________ ligament using fluoroscopy as a guide.  This was  again at 3-4. We then placed our final dilator and it measured less than 12  mm or 10 mm X-Stop was picked.  The X-Stop was placed in the interspinous  ligament.  The locking nut was placed.  We closed the two sides and then  tightened the nuts.  Attention was then turned to 4-5.  Again we got as low  as we could.  Facets were hypertrophic.  Placed the first and second  dilators through the ligament. The final dilator __________ expand up to  just under 14 mm, 12 mm X-Stop device was then placed through the ligament.  Locking wing placed.  We compressed the two sides and then we did the final  tightening.  The patient had prior surgery done at 5-1 and the interspinous  ligament did look a little damaged up at 4-5, some scar there and we made  two holes through the spinous processes of 4 and 5 and a looped 1 nylon was  brought through the two spinous processes in a figure-of-eight and tied.  __________ support to keep the X-Stop device in place.  We irrigated with  antibiotic solution.  We had good hemostasis and the fascia was then closed  with 0 Vicryl interrupted suture.  The subcutaneous tissue was closed with  0, 2-0 and 3-0 Vicryl interrupted suture and the skin closed with Dermabond  skin glue. A dry dressing was placed.  The patient was placed back in supine  position, awakened from anesthesia and transferred to recovery room in  stable condition.            ______________________________  Clydene Fake, M.D.     JRH/MEDQ  D:  12/20/2004  T:  12/20/2004  Job:  [id number]

## 2010-08-06 NOTE — Discharge Summary (Signed)
NAMEGILBERTO, STRECK                          ACCOUNT NO.:  [id number]   MEDICAL RECORD NO.:  [id number]                   PATIENT TYPE:  INP   LOCATION:  4732                                 FACILITY:  [facility]   PHYSICIAN:  Vesta Mixer, M.D.              DATE OF BIRTH:  1950/03/26   DATE OF ADMISSION:  02/07/2002  DATE OF DISCHARGE:  02/10/2002                                 DISCHARGE SUMMARY   DISCHARGE DIAGNOSES:  1. Congestive heart failure.  2. History of coronary artery disease.  3. Hypercholesterolemia.  4. Hypothyroidism.  5. Chronic anticoagulation.   DISCHARGE MEDICATIONS:  1. Aspirin 81 mg a day.  2. Synthroid 0.1 mg q.d.  3. Pravachol 20 mg q.d.  4. Lasix 40 mg q.d.]  5. Potassium chloride 20 mEq q.d.  6. Coreg 3.125 mg twice a day.  7. Lisinopril 2.5 mg p.o. b.i.d.  8. Coumadin 5 mg q.d. She will follow up with Dr. Deborah Chalk for pro times.   DISPOSITION:  The patient is to see Dr. Deborah Chalk in one to two weeks.  She is  to eat a low fat, low salt diet.  She is to have her pro time checked in the  office later this week.   HISTORY OF PRESENT ILLNESS:  The patient is a 60 year old female with a  history of coronary artery disease, status post acute anterior wall  myocardial infarction in August of this year.  She was admitted to the  hospital with episodes of shortness of breath and congestive heart failure.  Please see dictated H&P for further details.   HOSPITAL COURSE:  Problem 1.  CONGESTIVE HEART FAILURE:  The patient  diuresed quite easily without any Lasix.  She did have minimally elevated  cardiac enzymes but they were more consistent with congestive heart failure  rather than an acute coronary event.  She had an echocardiogram which  revealed persistent anterior and anterior apical akinesis which is  consistent with her previous MI.  She had an ejection fraction of around  35%.  She did not have any further episodes of dyspnea.  She was started on  Coreg and Lisinopril and responded quite nicely.  She will take the Lasix on  a daily basis rather than on a p.r.n. basis.  We will restart her Coumadin.  She will follow  up with Dr. Deborah Chalk for further management of her congestive heart failure.  She will see Dr. August Saucer for further management of her medical issues.  She was  originally scheduled to have a stress test this week.  I think that she  should see Dr. Deborah Chalk for an office visit and reschedule the stress test  for two to three weeks.  Vesta Mixer, M.D.    PJN/MEDQ  D:  02/10/2002  T:  02/11/2002  Job:  [id number]   cc:   Colleen Can. Deborah Chalk, M.D.  [address], [address]  [place]  [place] [address]  Fax: [phone]   Lind Guest. August Saucer, M.D.  [address] [address]  [place]  [place] [address]  Fax: [phone]

## 2010-08-06 NOTE — Op Note (Signed)
NAMEIMAN, REINERTSEN                ACCOUNT NO.:  [id number]   MEDICAL RECORD NO.:  [id number]          PATIENT TYPE:  INP   LOCATION:  4712                         FACILITY:  [facility]   PHYSICIAN:  Thera Flake., M.D.DATE OF BIRTH:  [birth year]   DATE OF PROCEDURE:  12/17/2003  DATE OF DISCHARGE:                                 OPERATIVE REPORT   PREOPERATIVE DIAGNOSIS:  Osteoarthritis, left knee.   POSTOPERATIVE DIAGNOSIS:  Osteoarthritis, left knee.   OPERATION:  Left total knee replacement (LCS cemented, standard plus femur  and patella, 10 mm bearing with size 3 tibia).   SURGEON:  Dyke Brackett, M.D.   ASSISTANT:  Jamelle Rushing, P.A.   TOURNIQUET TIME:  Approximately 55 minutes.   DESCRIPTION OF PROCEDURE:  Sterile prep and drape.  Exsanguination of the  leg and inflation to 375.  A straight skin incision, medial parapatellar  approach to the knee.  Resection of the tibia with a 10 degree posterior  slope about 2 mm below the most diseased compartment, followed by the  anterior-posterior femoral cut, creation of a flexion gap at approximately  10 mm, followed by the distal femoral cut, 4 degree valgus.  Flexion-  extension gap equaled with good ligamentous tension at 10 mm bearing.  This  was followed by a small keel holes for the femoral prosthesis, retention of  the PCL, and resection of the posterior remnants of the menisci and  posterior bone spurs.  The keel was cut for the tibia, a size 3 tibial tray  inserted as a trial.  Range of motion was checked with the trials in and  deemed to be 0 to about 120 with no tendency to bearing spin-out.  Good  varus and valgus stability.  The patella was cut leaving 14 mm of remaining  patella with a three-pronged patella.   All components were inserted after the trials were removed.  Copious  irrigation.  Cement was inserted in the doughy state and excess cement was  removed.  The trial bearing was placed.  The cement was  allowed to harden.  The tourniquet was released with the trial bearing removed.  Excess cement  was removed from the posterior aspect of the knee.  No excessive bleeding  was noted.  A Hemovac drain was placed deeply.  Closure was effected with #1  Ethibond and 2-0 Vicryl and skin clips.  Marcaine with epinephrine in the  skin, a lightly compressive sterile dressing applied.      Margarito Liner   WDC/MEDQ  D:  12/17/2003  T:  12/18/2003  Job:  [id number]

## 2010-08-06 NOTE — Op Note (Signed)
[name], [name]                          ACCOUNT NO.:  [id number]   MEDICAL RECORD NO.:  [id number]                   PATIENT TYPE:  INP   LOCATION:  3009                                 FACILITY:  [facility]   PHYSICIAN:  Clydene Fake, M.D.               DATE OF BIRTH:  [birth year]   DATE OF PROCEDURE:  DATE OF DISCHARGE:  10/26/2001                                 OPERATIVE REPORT   DIAGNOSES:  Severe spondylosis and spinal stenosis, left L5-S1 with L5 root  dysfunction.   POSTOPERATIVE DIAGNOSES:  Severe spondylosis and spinal stenosis, left L5-S1  with L5 root dysfunction.   PROCEDURE:  Left L5-S1 decompressive laminectomy, __________  facetectomy  and extensive foraminotomy with decompression of L5 root.  Done with  microscope.   SURGEON:  Clydene Fake, M.D.   ASSISTANT:  Hewitt Shorts, M.D.   ANESTHESIA:  General endotracheal.   ESTIMATED BLOOD LOSS:  Minimal.   BLOOD GIVEN:  None.   DRAINS:  None.   COMPLICATIONS:  None.   REASONS FOR PROCEDURE:  The patient is an 60 year old woman who has been  having bad left back pain and left foot numbness.  The numbness of the foot  has become intense over the last few weeks, with some weakness in the left  foot.  MRI was done, showing severe spondylosis at multiple levels with  spondylolisthesis at a couple of levels, but no abnormal motion at the L5-S1  level, but there is at the upper levels.  There is obliteration of the left  L5-S1 foramen, compressing the L5 root.  The patient is brought in for  decompression.  The patient has weakness and numbness in the L5 root.   PROCEDURE IN DETAIL:  The patient was brought to the operating room and  general anesthesia was induced.  The patient was placed in the prone  position; Wilson frame with all pressure points padded.  The patient was  prepped and draped in the usual sterile fashion, and subincision was  injected with 10 cc of 1% lidocaine with epinephrine.   Needle was placed in  the interspace, x-ray obtained showing this was the L5-S1 interspace.  Incision was then made centered where the needle was.  Incision made on to  the fascia.  Hemostasis was obtained with the Bovie.  The fascia was incised  and subperiosteal dissection was done at the L5 and S1 spinous processes of  laminae and facet.  Self-retaining retractor was placed.  Another marker was  placed in the interspace.  X-ray taken to ensure this was at the L5-S1  interspace.  Microscope was brought in for microdissection.  At this point  high-speed drill was used to perform a laminectomy and medial facetectomy.  Kerrison punch was used to complete this laminectomy and remove hypertrophic  ligaments.  We tried to follow up the foramen; there was significant bone  spurring  osteophytes, and a couple of pieces of facet joint and calcified  ligament; all obliterating the canal.  We carefully removed this with the  drill and Kerrison punches, opening up the foramen.  When we were finished  we did decompression of the L5 root and we did a hockey stick out through  the foramen easily.  The S1 root was also decompressed with laminectomy.  Disk space was explored and diskectomy was needed.  We were happy with this  decompression.  Using Gelfoam soaked in Depo-Medrol, it was placed over the  L5 root.  Retractors were removed.  The fascia was closed with 0 Vicryl  interrupted sutures.  The subcutaneous tissue was closed with 0, 2-0 and 3-0  Vicryl interrupted suture.  Skin was closed with Dermabond skin glue.  A dry  dressing was placed.  The patient was placed back into the supine position.  Awakened from anesthesia and transferred to the recovery room in stable  condition.                                               Clydene Fake, M.D.    JRH/MEDQ  D:  10/24/2001  T:  10/28/2001  Job:  [id number]

## 2010-08-06 NOTE — H&P (Signed)
[name], [name] NO.:  [id number]   MEDICAL RECORD NO.:  [id number]                   PATIENT TYPE:  INP   LOCATION:                                       FACILITY:  [facility]   PHYSICIAN:  Dyke Brackett, M.D.                 DATE OF BIRTH:  March 22, 1950   DATE OF ADMISSION:  12/10/2003  DATE OF DISCHARGE:                                HISTORY & PHYSICAL   CHIEF COMPLAINT:  Left knee pain for the last 25 years.   CHIEF COMPLAINT:  Left knee pain for the last 25 years.   HISTORY OF PRESENT ILLNESS:  This 60 year old black female patient presented  to Dr. Madelon Lips with a 25-year history of gradual onset of progressively  worsening left knee pain.  She has a history of a left knee arthroscopy in  the 1980's and then a right knee open meniscectomy in 1975.  She has had no  recent injury or any previous injury to her knee but while she was doing  cardiac rehab her left knee started getting aggravated and it has been  getting gradually and progressively worse since that time.   At this point the left knee pain is an intermittent sharp sensation diffuse  about the joint without radiation.  It increases with walking and decreases  with rest.  Her knee catches, locks, gives away and swells at times but no  popping or grinding.  There is no night pain associated with it.  She has  gotten a pull-over brace to wear at times and that does provide some relief.  She is currently taking Arthrotec and Vicodin for pain and that provides  temporary relief.  She has received cortisone injections and Synvisc  injections in the knee in the past and that provided some temporary relief.  She is currently not ambulating with any assisted devices.   ALLERGIES:  PENICILLIN (CAUSES THROAT EDEMA).   CURRENT MEDICATIONS:  1.  Lasix 40 mg 1 p.o. every other day.  2.  Lisinopril 2.5 mg 1 p.o. q.a.m.  3.  Aspirin 325 mg 1 tablet p.o. q.a.m. (last dose December 02, 2003).  4.   Protonix 40 mg 1 tablet p.o. q.a.m.  5.  Synthroid 0.1 mg p.o. q.a.m.  6.  Foltx 2.5 mg 1 tablet p.o. q.a.m.  7.  K-Dur 20 mEq 1 tablet p.o. every other day.  8.  Arthrotec 75 mg 1 tablet p.o. q.a.m. (last dose December 05, 2003).  9.  Tricor 145 mg 1 tablet p.o. q.a.m.  10. Allegra 180 mg 1 tablet p.o. q.a.m.  11. Coreg 6.25 mg 1 tablet p.o. b.i.d.  12. Flonase 2 sprays to nares q.a.m.  13. Pravachol 40 mg 1 tablet p.o. q.a.m.  14. Coumadin 2.5 mg p.o. q.a.m. (last dose December 02, 2003).  15. Nitro-Tab 0.4 mg sublingually p.r.n. chest pain.  16. Metolazone 2.5  mg 1 tablet p.o. q.a.m. p.r.n.   PAST MEDICAL HISTORY:  1.  Hypertension for 20 years.  2.  History of thyroidectomy in the 1970's due to overactive thyroid.  3.  Coronary artery disease with history of myocardial infarction on November 03, 2001 due to a clot after her lumbar surgery.  4.  Subsequently had a cardiac catheterization with 3 stents placed.  5.  Cardiac defibrillator placed Jul 31, 2003.  6.  Congestive heart failure, November, 2003.  7.  Probable mild chronic renal insufficiency with recent evaluation by Dr.      Cecille Aver who ordered a CT of her pelvis and an ultrasound      of the bladder.  8.  History of severe degenerative disk disease of lumbar spine with severe      stenosis requiring surgery in August, 2003.  9.  She denied any history of diabetes mellitus, hiatal hernia, peptic ulcer      disease, asthma or any other chronic medical condition other than noted      previously.   PAST SURGICAL HISTORY:  1.  Tonsillectomy.  2.  Thyroidectomy, 1970's.  3.  Left knee arthroscopy, 1980.  4.  Right knee open meniscectomy, 1975.  5.  Lumbar surgery, August, 2003 by Dr. Phoebe Perch.  6.  Cardiac stent placement, August, 2003 and August, 2004.  7.  Cardiac defibrillator placed, May, 2005 by Dr. Graciela Husbands.   The only complication she reports from surgery was the heart attack due to a  clot after  her lumbar surgery.   SOCIAL HISTORY:  She has a minimal history of cigarette smoking, 1 cigarette  a day for 20 years and she quit 3 years ago.  She drinks alcohol maybe once  a week.  She does not use any drugs other than her prescriptions.  She is a  widow.  She does not have any children.  She lives by herself in a 1-story  house with 2 steps into the main entrance.  She is a retired Water quality scientist.  Her medical doctor is Dr. Andi Devon at [phone].  Her  cardiologist is Dr. Deborah Chalk at [phone], and her kidney doctor is Dr.  Kathrene Bongo at [facility], [phone].   FAMILY HISTORY:  Her mother died at age 47 with diabetes and kidney failure.  Her father is alive at age 22 with hypertension and Alzheimer's.  She has 1  brother age 15 who has diabetes.   REVIEW OF SYSTEMS:  She does have a history of 3 TIA's in the past with  blurred vision. First was in December of 2004 and the last 2 were in January  of 2005.  She does have seasonal allergies.  She required her back surgery  because a nerve got pinched in her back rather acutely and she now has some  chronic left great and second toe numbness.  She does wear contacts and has  glasses for reading.  She does have a Living Will and her power of attorney  is [employer].  All other systems are negative and  noncontributory.   PHYSICAL EXAMINATION:  GENERAL:  Well-developed, well-nourished thin, black  female in no acute distress.  Talks easily with examiner and walks with a  limp.  Mood and affect are appropriate.  Height 5 feet, 7 inches, weight 160 pounds.  BMI is 24.  VITAL SIGNS:  Temperature 98.2 degrees Fahrenheit, pulse 60, respirations  12, blood pressure 122/60.  HEENT:  Normocephalic, atraumatic without frontal or maxillary sinus  tenderness to palpation.  Conjunctivae pink, sclerae anicteric.  PERRLA. EOM's intact. No visible external ear deformities.  Hearing grossly intact.  Tympanic membranes are  obscured by a large amount of cerumen in each ear  canal.  Nose and nasal septum midline.  Nasal mucosa pink and moist without  exudates or polyps noted.  Buccal mucosa pink and moist.  Good dentition.  Pharynx without erythema or exudates.  Tongue and uvula midline.  Tongue  without fasciculations and uve ula rises equally with phonation.  NECK:  She has a low base of the neck incision line that is well-healed. No  visible masses or lesions noted.  Trachea midline.  No palpable  lymphadenopathy and an unable to palpate the thyroid due to its absence.  Carotids +2 bilaterally.  No bruits. Full range of motion and nontender to  palpation along the cervical spine.  CARDIOVASCULAR:  She has a well-healed left chest incision line where the  defibrillator was placed.  S1, S2 present without clicks, rubs or murmurs.  Rate is regular.  RESPIRATORY:  Respirations even and unlabored.  Breath sounds clear to  auscultation bilaterally without rales or wheezes noted.  ABDOMEN:  Rounded abdominal contour. Bowel sounds present x4 quadrants.  Soft, nontender to palpation without hepatosplenomegaly nor CVA tenderness.  Femoral pulses +2 bilaterally.  Nontender to palpation along the vertebral  column.  BREASTS/GU/RECTAL/PELVIC:  Deferred at this time.  MUSCULOSKELETAL:  She has no obvious deformities bilateral upper extremities  with full range of motion of these extremities without pain.  Radial pulses  are +2 bilaterally.  She has full range of motion of her ankles and toes  bilaterally. Dorsalis pedis and Posterior tibial pulses are +2.  No lower  extremity edema.  Right hip has full extension and flexion to 120 degrees  with full internal/external rotation without pain.  Left hip has full  extension but flexion only to about 100 degrees and she has markedly  decreased internal rotation and only slightly increased external rotation.  Internal and external rotation do seem to cause some pain at this  time.  Right knee has a well-healed medial curvilinear incision.  Skin is intact  without erythema or ecchymosis. She has full extension on flexion to 140  degrees without crepitus, effusion or pain.  She had no pain with palpation  along the joint line, stable to varus and valgus stress, negative anterior  drawer.  Left knee has well healed arthroscopy sites.  She has full  extension and flexion about 125 degrees with minimal pain along the medial  joint line.  Minimal effusion.  No crepitus with range of motion.  Stable to  varus and valgus stress, negative anterior drawer.  She has negative calf  pain to palpation bilaterally and negative Homan's sign.   NEUROLOGIC:  Alert and oriented x3.  Cranial nerves II-XII are grossly  intact. Strength 5 out of 5 bilateral upper and lower extremities.  Rapid  alternating movements intact.  deep tendon reflexes 2+ bilateral upper and  lower extremities.  RADIOLOGIC FINDINGS:  X-rays taken of her left knee in 2005 show bone on  bone contact in the knee.  AP pelvis and lateral x-ray of her left hip taken  at this time show minimal osteoarthritic changes including some spurring and  minimally decreased joint space on the left compared to the right.   IMPRESSION:  1.  End-stage osteoarthritis left knee.  2.  Mild osteoarthritis left hip.  3.  Hypertension.  4.  History of thyroidectomy due to overactive thyroid.  5.  Coronary artery disease with history of myocardial infarction due to a      clot in August of 2003.  6.  Cardiac defibrillator.  7.  History of congestive heart failure, November of 2003.  8.  Mild chronic renal insufficiency.  9.  History of severe degenerative disk disease lumbar spine with spinal      stenosis.  10. History of transient ischemic attack x3 with visual symptoms.   PLAN:  Ms. Aguilera will be admitted to [facility] on December 10, 2003 where she will undergo a left total knee arthroplasty by Dr. Marcie Mowers.  She will undergo all the routine preoperative laboratory tests and  studies prior to this procedure.  She has been told to stop her Coumadin  today unless redirected by Dr. Deborah Chalk who  she will see later today, December 03, 2003.  We will follow up on her exam  with Dr. Deborah Chalk and Dr. Kathrene Bongo.  If we have any medical issues while  she is hospitalized we will consult Dr. Deborah Chalk, Dr. Kathrene Bongo or Dr.  Renae Gloss.       Legrand Pitts Marshall Cork, M.D.    KED/MEDQ  D:  12/05/2003  T:  12/05/2003  Job:  [id number]

## 2010-08-06 NOTE — Discharge Summary (Signed)
[name], [name]                          ACCOUNT NO.:  [id number]   MEDICAL RECORD NO.:  [id number]                   PATIENT TYPE:  INP   LOCATION:  3701                                 FACILITY:  [facility]   PHYSICIAN:  Colleen Can. Deborah Chalk, M.D.            DATE OF BIRTH:  1950-06-30   DATE OF ADMISSION:  11/25/2001  DATE OF DISCHARGE:  11/27/2001                                 DISCHARGE SUMMARY   DISCHARGE DIAGNOSES:  1. Chest discomfort negative for myocardial infarction.  2. Recent anterior wall myocardial infarction on August 16, with stent     placement to the left anterior descending and subsequent stenting of the     right coronary artery on August 16 and August 22, respectively.  3. Abscessed tooth.  4. Multiple arthralgias consistent with penicillin allergy.  5. Osteoporosis.  6. Glucose intolerance.  7. 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  8. Gastroesophageal reflux disease.  9. Previous hypotension, now improved.   HISTORY OF PRESENT ILLNESS:  The patient is a pleasant 60 year old black  female who had an extensive anterior wall myocardial infarction sustained on  August 16.  At that time, she underwent stenting of the left anterior  descending and then proceeded back on August 22 for stenting of the right  coronary artery.  She has been seen back in the office two times since her  discharge from the hospital primarily for hypotension which was mildly  symptomatic.  Her Altace had been discontinued. Metoprolol dose had been  decreased and then subsequently discontinued altogether on September 5.  On  her visit to the office on September 5, she noted that she had continued to  have low grade fever.  She had a considerable amount of facial pain as well  as an abscessed tooth in the left upper palate.  She was started empirically  on high doses of amoxicillin.  Subsequently, throughout the course of the  weekend, she developed significant and diffuse arthralgias and was  basically  unable to ambulate.  She also had associated chest discomfort which felt  like indigestion and which was somewhat similar to her previous chest pain  syndrome.  She subsequently came to the emergency room and was admitted by  Francisca December, M.D.  Please see the history and physical per Dr. Amil Amen  for further patient presentation and profile.   LABORATORY DATA:  Troponin 0.05 to 0.07.  All CK-MBs were negative.  EKG  showed evolving changes consistent with her previous myocardial infarction.  Hematocrit was 32, white count 6000, chemistries were satisfactory.   HOSPITAL COURSE:  The patient was admitted to the service of Colleen Can.  Deborah Chalk, M.D.  Amoxicillin was discontinued and Zithromax was started  empirically.  Her blood pressure was noted to be higher up to 110 to 120  systolic and low dose metoprolol was added back to her medical regimen.  By  the following day, most  of her arthralgias dissipated.  She continued to  have considerable right shoulder pain, but this has now dissipated as well  at the time of discharge.  By November 27, 2001, she is doing well.  She has  been up and ambulatory without problems.  She continues, however, to have  persistent low grade fevers, but was considerably less arthralgias. Her  maximum temperature was 100.6.  Blood pressure is now up to 140/70.  Her  overall physical examination remains unremarkable and she is felt to be  stable from a cardiac standpoint with further  evaluation to occur on an  outpatient basis.   CONDITION ON DISCHARGE:  Stable.   DISCHARGE MEDICATIONS:  She will resume all of her previous medicines which  include Synthroid, Protonix, Plavix, Lasix, potassium, and aspirin.  We will  resume the Lopressor 25 mg two times a day.  Will add Zithromax 250 mg for  the next four days.  Will go ahead and proceed on with our original  arrangements to obtain two-dimensional echocardiogram later on this  afternoon at Dr.  Ronnald Nian office.  She will see the nurse practitioner in  one week and will go ahead and make arrangements to follow up with her  dentist in the next day or so.     Juanell Fairly C. Earl Gala, N.P.                 Colleen Can. Deborah Chalk, M.D.    LCO/MEDQ  D:  11/27/2001  T:  11/28/2001  Job:  [id number]   cc:   Minerva Areola L. August Saucer, M.D.

## 2010-08-06 NOTE — H&P (Signed)
[name], [name]                            ACCOUNT NO.:  [id number]   MEDICAL RECORD NO.:  [id number]                   PATIENT TYPE:  OIB   LOCATION:                                       FACILITY:  [facility]   PHYSICIAN:  Colleen Can. Deborah Chalk, M.D.            DATE OF BIRTH:  Feb 12, 1951   DATE OF ADMISSION:  10/07/2002  DATE OF DISCHARGE:                                HISTORY & PHYSICAL   CHIEF COMPLAINT:  Significant fatigue.   HISTORY OF PRESENT ILLNESS:  [name] is a very pleasant, 60 year old,  white female who has a history of ischemic heart disease.  She had a  previous large anterior MI dating back to August of 2003.  At that time, she  had emergent cardiac catheterization with stent placement to the proximal  LAD and was subsequently taken back to the cardiac catheterization lab  several days later on November 10, 2002, for subsequent stent placement to the  obtuse marginal branch.  She has had a Cardiolite study dating back to  November which was felt to be satisfactory.  There was concern for apical  akinesis and the possibility of apical aneurysm and the patient has been on  Coumadin anticoagulation.   She has been seen quite frequently here in the office with complaints of  holding onto to fluid and being short of breath with significant fatigue.  Her medicines have been adjusted, but she continues to fail with outpatient  therapy.  She now presents for elective cardiac catheterization.   PAST MEDICAL HISTORY:  1. Atherosclerotic cardiovascular disease with previous anterior MI in     August of 2003 with subsequent stent placement to the LAD and     subsequently to the obtuse marginal branch.  2. LV dysfunction with 2-D echocardiogram in November of 2003 showing EF of     25-35%.  3. History of Cardiolite in November of 2003 with ejection fraction of 30%.  4. 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  5. Past tobacco abuse.  6. Hypertension.  7.  Hypothyroidism.  8. Glucose intolerance.  9. Hyperlipidemia.  10.      History of lumbar laminectomy in 2003.  11.      Osteoporosis.   ALLERGIES:  1. PENICILLIN.  2. LIPITOR.   CURRENT MEDICATIONS:  1. Protonix 40 mg a day.  2. Aspirin daily.  3. Arthrotec daily.  4. Lasix 80 mg daily.  5. Coumadin 5 mg a day.  6. Potassium 20 mEq a day.  7. Coreg 12.5 mg in the morning and 6.25 mg in the evening.  8. Lisinopril 2.5 mg b.i.d.  9. Pravachol 40 mg daily.  10.      Allegra daily.  11.      Synthroid daily.  12.      Nitrol paste one inch at bedtime.  13.      Zaroxolyn p.r.n.  FAMILY HISTORY:  Unchanged.   SOCIAL HISTORY:  Unchanged.   REVIEW OF SYSTEMS:  Otherwise as noted above and otherwise unremarkable.   PHYSICAL EXAMINATION:  GENERAL APPEARANCE:  She is a pleasant black female  in no acute distress.  VITAL SIGNS:  The blood pressure today is 100/70 sitting and 100/70  standing.  The heart rate is 64 and regular.  WEIGHT:  154-1/2 pounds.  SKIN:  Warm and dry.  Color is unremarkable.  LUNGS:  Basically clear.  HEART:  Regular rhythm.  ABDOMEN:  Soft.  Positive bowel sounds.  EXTREMITIES:  No evidence of edema.   LABORATORY DATA:  Pertinent labs are pending.   OVERALL IMPRESSION:  1. Progress failure to thrive with failure on outpatient therapy.  2. Significant ischemic heart disease with previous anterior myocardial     infarction approximately one year ago with stent placement x 2 to the     left anterior descending and obtuse marginal.  3. Congestive heart failure with moderate to severe left ventricular     dysfunction.  4. Hyperlipidemia.  5. History of hypertension.   PLAN:  Will proceed on with elective cardiac catheterization.  The procedure  has been discussed in full detail and she is willing to proceed on Monday,  October 07, 2002.     Juanell Fairly C. Earl Gala, N.P.                 Colleen Can. Deborah Chalk, M.D.    LCO/MEDQ  D:  10/02/2002  T:   10/02/2002  Job:  [id number]   cc:   L. Lupe Carney, M.D.  [address] [place]  [place] [address]  Fax: [phone]   Merlene Laughter. Renae Gloss, M.D.  [address]  [address]  [place]  [place] [address]  Fax: [phone]    cc:   L. Lupe Carney, M.D.  [address] [place]  [place] [address]  Fax: [phone]   Merlene Laughter. Renae Gloss, M.D.  [address]  [address]  [place]  [place] [address]  Fax: [phone]

## 2010-08-06 NOTE — Cardiovascular Report (Signed)
[name], Becky                          ACCOUNT NO.:  [id number]   MEDICAL RECORD NO.:  [id number]                   PATIENT TYPE:  INP   LOCATION:  2030                                 FACILITY:  [facility]   PHYSICIAN:  Colleen Can. Deborah Chalk, M.D.            DATE OF BIRTH:  1950/09/28   DATE OF PROCEDURE:  DATE OF DISCHARGE:                              CARDIAC CATHETERIZATION   HISTORY:  The patient is a 60 year old female with back surgery a week ago.  She has a longstanding history of hypertension.  She presents with 12-16  hours of substernal chest pain.  An EKG showed changes compatible with acute  anterior myocardial infarction.  She also had inferior T waves noted.  She  was referred for urgent catheterization and angioplasty.   PROCEDURE:  Left heart catheterization with selective coronary angiography,  left ventricular angiography, and angioplasty of the left anterior  descending with stent placement in the proximal left anterior descending.   TYPE AND SITE OF ENTRY:  Percutaneous right femoral artery.   CATHETERS:  The #6 [place] 4-curved Judkins right and left coronary  catheters, #6 French pigtail ventriculographic catheters, JL3.5 guide  catheter, Hi-Torque Floppy guidewire, subsequently a 100 Cross-It wire,  additionally a 2.5 x 20-mm CrossSail balloon, subsequently a 3.0 x 18-mm  hepacoat stent and then distally in the left anterior descending, a 2.0 x 15-  mm CrossSail balloon.   CONTRAST MATERIAL:  Omnipaque.   MEDICATIONS GIVEN PRIOR TO PROCEDURE:  Heparin IV, nitroglycerin, and  morphine.   MEDICATIONS GIVEN DURING PROCEDURE:  Fentanyl 25 mcg IV, Versed 2 mg IV, IV  nitroglycerin, Integrilin, heparin.   COMMENTS:  In general, the patient tolerated the procedure well.   HEMODYNAMIC DATA:  1. The aortic pressure was 111/67.  2. LV was 111/17.  3. There was no aortic valve gradient noted on pullback.   ANGIOGRAPHIC DATA:  1. The left main coronary  artery is normal.  2. Left anterior descending:  The left anterior descending is totally     occluded just beyond the largest septal perforating branch.  There is a     proximal diagonal vessel that is still patent.  3. Left circumflex:  The left circumflex is a reasonably large vessel with a     large posterolateral branch.  There is a high obtuse marginal that     extends to the apex which has a 90% somewhat segmental narrowing.  It     would be a 3-mm vessel and should be suitable for stent placement.  4. Right coronary artery:  The right coronary artery is a moderate-sized     dominant vessel.  There are irregularities but no significant focal     narrowing.  The posterior descending vessel ends approximately two-thirds     of the way down the inferior wall and does not give collaterals to the     distal left  anterior descending.   LEFT VENTRICULAR ANGIOGRAM:  Left ventricular angiogram was performed in the  RAO position.  The overall cardiac size is normal.  There was extensive  anteroapical and inferoapical akinesia.  The more basilar portions of the  anterior and inferior wall contracted vigorously but the distal apex and  surrounding area was profoundly akinetic.  No intracavitary filling defect  could be noted at this time.  There was no mitral regurgitation.   ANGIOPLASTY PROCEDURE:  We used a 3.5, #7 [place] guide catheter that  provided satisfactory backup.  It was difficult to cross the stenosis and we  were unable to do so with a Hi-Torque Floppy wire.  We subsequently used a  100 Cross-It wire and after some manipulation, the wire eventually popped  across and we were clearly in the distal lumen.  We initially dilated with a  2.5 balloon at the area of obstruction and then subsequently dilated further  distally.  This resulted in satisfactory flow extending to a more distal  diagonal branch; however, prior to the apex, the left anterior descending  became very small and  obstructed again.  Using the 2.5 balloon and  subsequently a 2.0 balloon, angioplasty was performed around the apex.  The  vessel maintained a sizable lumen even in these distal portions.  The final  angiographic result shows excellent flow through this stent and to the  distal diagonal vessel with a TIMI grade 3 flow at this level but as we  moved on around the apex, there was sluggish TIMI grade 2 flow but there was  eventual washout of contrast at the apex as compared to a total reflow and  __________ phenomenon.   OVERALL IMPRESSION:  1. Extensive anteroapical infarction with inferoapical involvement.  2. Successful stent placement in the proximal left anterior descending with     sluggish flow around the inferior apex in spite of distal segmental     angioplasty but with satisfactory flow in the diagonal vessels arising     more proximally after the stent.  3. Significant stenosis in the left circumflex marginal.   DISCUSSION:  The patient will be managed with her acute infarction.  We will  need to be concerned about congestive heart failure or even the possibility  of thrombus.  Because she is one week postoperative from her back surgery,  that certainly poses more concern to [place].  Before hospital discharge, we will  need to attempt stent placement in the left circumflex coronary.  It may be  that the left circumflex will have to be addressed even sooner if she began  having complications or suggestions of having ischemia in this area because  of the anterior infarction.                                                  Colleen Can. Deborah Chalk, M.D.    SNT/MEDQ  D:  11/03/2001  T:  11/06/2001  Job:  [id number]   cc:   Minerva Areola L. August Saucer, M.D.   Clydene Fake, M.D.

## 2010-08-06 NOTE — Discharge Summary (Signed)
NAMEKRISTIANA, JACKO                          ACCOUNT NO.:  [id number]   MEDICAL RECORD NO.:  [id number]                   PATIENT TYPE:  INP   LOCATION:  2025                                 FACILITY:  [facility]   PHYSICIAN:  Colleen Can. Deborah Chalk, M.D.            DATE OF BIRTH:  [birth year]   DATE OF ADMISSION:  06/12/2002  DATE OF DISCHARGE:  06/14/2002                                 DISCHARGE SUMMARY   PRIMARY DISCHARGE DIAGNOSIS:  Cellulitis of the face with subsequent  resolution.   SECONDARY DISCHARGE DIAGNOSIS:  1. Recent anterior myocardial infarction, August 2003, treated with stent     placement to the left anterior descending and subsequent stenting of the     left circumflex.  2. Known left ventricle dysfunction.  3. Congestive heart failure, currently stable, clinically.  4. Hyperlipidemia.  5. Hypertension.  6. Glucose intolerance.   HISTORY OF PRESENT ILLNESS:  The patient is a very pleasant 60 year old  black female who has multiple medical problems.  She has known cardiac  disease and has basically done well since her myocardial infarction but has  had numerous bouts of URIs, tooth infections as well as sinusitis.  She  presented to our office on June 11, 2002 with complaints of a runny nose  and sore throat that had started the previous Sunday.  Several of her  coworkers had been sick as well.  By the time of her office appointment,  though, she began having facial edema as well as erythema.  She was  subsequently started on Zithromax as an outpatient and was given 500 mg on  Tuesday.  She called back to the office the following day and really had had  no notable improvement, and, if anything, her symptoms had worsened.  She  began having edema, somewhat in the upper neck region as well.  There was no  shortness of breath.  She subsequently was admitted for IV antibiotics and  further medical evaluation.   Please see dictated history and physical for further  patient presentation  and profile.   LABORATORY DATA:  On admission:  Blood cultures were negative.  Urinalysis  was basically within normal limits.  White count was 6.8, hematocrit 35.  INR was satisfactory at 2.8.  Chemistries were normal except for a BUN of  28.   Chest x-ray showed no active disease.   HOSPITAL COURSE:  Patient was admitted to telemetry.  Infectious Disease  consult was obtained.  She was given vancomycin.  She subsequently had  resolution and was subsequently switched over to clindamycin.  She had no  cardiac complaints during this admission.   DISCHARGE CONDITION:  Stable.   DISCHARGE MEDICATIONS:  She will resume all of her previous cardiac  medicines.  We will have her not finish the Zithromax.  The prescription for  clindamycin 300 mg t.i.d. for one week was provided.   FOLLOW UP:  She will be seen back in the office in approximately one week,  certainly sooner if problems would arise.     Juanell Fairly C. Earl Gala, N.P.                 Colleen Can. Deborah Chalk, M.D.    LCO/MEDQ  D:  07/31/2002  T:  08/01/2002  Job:  [id number]   cc:   Colleen Can. Deborah Chalk, M.D.  [address], [address]  [place]  [place] [address]  Fax: [phone]   Merlene Laughter. Renae Gloss, M.D.  [address]  [address]  [place]  [place] [address]  Fax: [phone]   Cliffton Asters, M.D.  [address] [place]  [place] [address]  Fax: [phone]

## 2010-08-06 NOTE — H&P (Signed)
[name], [name] NO.:  [id number]   MEDICAL RECORD NO.:  [id number]                    PATIENT TYPE:   LOCATION:                                       FACILITY:   PHYSICIAN:  Colleen Can. Deborah Chalk, M.D.            DATE OF BIRTH:   DATE OF ADMISSION:  DATE OF DISCHARGE:                                HISTORY & PHYSICAL   HISTORY OF PRESENT ILLNESS:  [name] is admitted with an acute anterior  myocardial infarction.  She is a 60 year old black female, admitted with an  acute anterior myocardial infarction.  She is a widow.  She is retired from  [employer].  She stopped smoking in February 2003.  She smoked  about one pack of cigarettes every week.  She uses alcohol socially.  She  has a degree in math from [employer]   She had a lumbar laminectomy approximately one week ago.  She had some left  leg weakness.  She has some left leg residual weakness.  She was nauseated  one day postop about nine days ago.  She went home about eight days ago, and  has basically done well.  Yesterday she awoke with indigestion, treated with  antacids throughout the day.  She took some Pepcid AC and thought that  provided some relief.  She had some soreness in her chest, but she  attributed that to the vomiting and the nausea.  Last night she had a very  restless night with 8/10 chest pain in the middle of her chest, radiating  somewhat to her left breast.  She was diaphoretic.  She was not really short  of breath.  She presented to the emergency room at 7:30 a.m. this morning,  where an EKG showed changes compatible with an acute anterior infarction.  With IV nitroglycerine and heparin, the chest pain had basically gone to a  1/10.   FAMILY HISTORY:  Her mother died at age 76 of diabetes and renal failure.  Father is alive at age 48.  She has one brother alive and well.  No  children.  She is a widow.   PAST MEDICAL HISTORY:  She had back surgery in August  2003, and had  arthroscopic knee surgery on one knee, and an open knee surgery on the  other.  She had a thyroidectomy in the 1970s.  She has a history of a  tonsillectomy as a child.  She has had hypertension for 25 years.  No  diabetes.  A history of borderline cholesterol elevation.  She has a history  of broken ribs on the right two to three weeks ago.   ALLERGIES:  She is allergic to DUST.  She also has a food allergy to NUTS.   MEDICATIONS:  1. Fosamax.  2. Tiazac 300 mg q.d.  3. Lozol one q.d.  4. Allergra 180 mg q.d.  5.  Flonase.   REVIEW OF SYSTEMS:  Is basically otherwise unremarkable.  She does have a  residual left leg numbness.  Overall she is doing well.  There is no blood  in her stools.  Urinary tract is unremarkable.  Neurologically she is  unremarkable otherwise.  She does have a history of a thyroidectomy, and  does give a history of being on thyroid replacement.   PHYSICAL EXAMINATION:  GENERAL  She is a very pleasant, thin black female, currently in no acute distress.   VITAL SIGNS  BP 123/76, heart rate 80, respiratory rate normal.   HEENT  She has exophthalmos present.  HEENT is negative.   NECK  Thyroidectomy scar.  No masses.   LUNGS  Clear.   HEART  No murmur, no gallop.   ABDOMEN  Soft, nontender.   EXTREMITIES  Peripheral pulses are normal.   EKG:  Sinus with acute anterior myocardial infarction.  There are Q-waves in  the inferolateral distribution.   IMPRESSION:  1. Acute anterior myocardial infarction with ST segment elevation with     inferior  Q-waves.  1. History of hypertension.   PLAN:  Acute cardiac catheterization.                                                Colleen Can. Deborah Chalk, M.D.    SNT/MEDQ  D:  11/03/2001  T:  11/06/2001  Job:  [id number]   cc:   Clydene Fake, M.D.   Eric L. August Saucer, M.D.

## 2010-08-06 NOTE — Discharge Summary (Signed)
[name], [name] NO.:  [id number]   MEDICAL RECORD NO.:  [id number]          PATIENT TYPE:  INP   LOCATION:  08-11-2025                         FACILITY:  [facility]   PHYSICIAN:  Colleen Can. Deborah Chalk, M.D.DATE OF BIRTH:  1950/08/06   DATE OF ADMISSION:  06/07/2005  DATE OF DISCHARGE:  06/12/2005                                 DISCHARGE SUMMARY   PRIMARY DISCHARGE DIAGNOSES:  1.  Syncope of uncertain etiology.  The patient's defibrillator has been      interrogated and reveals no episodes of arrhythmia or discharge.  She      has undergone cardiac catheterization due to EKG changes, which show      that previously-placed stents in the left circumflex and left anterior      descending artery are patent, and she will continue with medical      management.  2.  Known ischemic cardiomyopathy, ejection fraction is 25%.  3.  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  4.  Frontal scalp hematoma per CT scan of the head.  5.  Hypertension, currently controlled.  6.  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  7.  Chronic renal insufficiency.  8.  Past tobacco abuse.  9.  History of transient ischemic attacks.  10. Degenerative joint disease.  11. Previous knee surgery.   HISTORY OF PRESENT ILLNESS:  The patient is a 60 year old black female.  She  presents to the emergency department following a syncopal episode at  approximately 4 a.m.  She was complaining of substernal chest pain.  She  really had no warning and really was not able to provide much details  regarding the event.  She has been under significant stress.  Her father had  died on the 2022-08-12 prior to admission.  She was subsequently seen in the  emergency room and admitted for further evaluation.   Please see the dictated history and physical for further patient  presentation and profile.   LABORATORY DATA:  White blood cell count was 9, hemoglobin 12.5, hematocrit  36, platelets 225.  Chemistry showed  sodium 136, potassium was 4, chloride  103, BUN was 19, creatinine was 1.8, glucose of 180.  Cardiac markers were  negative.  Chest x-ray showed cardiomegaly.  EKG showed evidence of an old  anterior infarct.  There was concern for new ST elevation in the anterior  leads and V2 through V4.   Her ICD was interrogated and showed no episodes and no discharges.   PROCEDURES PERFORMED:  1.  Implantable cardioverter-defibrillator interrogation, which was      unremarkable.  2.  CT scan of the head, which showed a frontal scalp hematoma with no other      acute abnormalities.  3.  Cardiac catheterization showing anterior apical aneurysm with an      ejection fraction of 25%, diffuse 30-40% narrowings in the right      coronary, the left main is satisfactory, the stent that has previously      been placed in the left anterior descending artery is patent.  There is  a 90% ostial narrowing at first diagonal, which is a potential site of      ischemia; however, the vessel is very small and not amenable to      intervention.  The left circumflex stent is patent.  There is no      stenosis, and overall the patient is felt to be satisfactorily      vascularized and will continue with medical management.   HOSPITAL COURSE:  The patient was admitted from the emergency room after  undergoing emergent cardiac catheterization due to the concern for EKG  abnormality.  Her cardiac markers were negative.  Post procedure she was  transferred to the coronary care unit, where she was monitored.  She had had  no spells of arrhythmia, and she had no recurrence of chest pain.  She has  had some significant discomfort in the chest and back area, which is  presumed secondary to the fall which had led to admission.  She was slowly  mobilized.  Home medicines have basically been continued without changes  except for her Edd Fabian has been placed on hold.  It was our plan for her  to be discharged on June 10, 2005; however, she is basically still not  ambulating independently.  She continues to have back pain.  She is dizzy  with standing.  Her INR is currently at 2.5.  CBC is normal.  BUN is 18 with  a creatinine of 2.3.  Our plan is at this time to recheck lab in the morning  and start physical therapy.  Will check lumbar spine films, and hopefully  she will be ready for discharge on Saturday morning.   DISCHARGE CONDITION:  Stable.   DISCHARGE MEDICATIONS:  1.  Lisinopril 2.5 mg b.i.d.  2.  Metizoline will be stopped for now.  3.  Coreg 18.75 mg b.i.d.  4.  Synthroid 100 mcg a day.  5.  Lasix 80 mg b.i.d.  6.  Protonix 40 mg a day.  7.  Aspirin daily.  8.  Foltx daily.  9.  Tricor daily.  10. Coumadin 2.5 mg daily.  11. Multivitamin daily.  12. A prescription for Darvocet has been provided, and she is asked to use      that or Tylenol as needed.   We plan on seeing her back in the office in approximately two weeks.  We  will have her touch base with primary care in one week.  She is to call our  office if any problems would arise in the interim.      Sharlee Blew, N.P.      Colleen Can. Deborah Chalk, M.D.  Electronically Signed    LC/MEDQ  D:  06/10/2005  T:  06/12/2005  Job:  [id number]   cc:   Merlene Laughter. Renae Gloss, M.D.  Fax: [phone]

## 2010-08-06 NOTE — Discharge Summary (Signed)
[name], [name]                          ACCOUNT NO.:  [id number]   MEDICAL RECORD NO.:  [id number]                   PATIENT TYPE:  INP   LOCATION:  6531                                 FACILITY:  [facility]   PHYSICIAN:  Juanell Fairly C. Earl Gala, N.P.           DATE OF BIRTH:  [birth year]   DATE OF ADMISSION:  11/03/2001  DATE OF DISCHARGE:  11/10/2001                                 DISCHARGE SUMMARY   PRIMARY DISCHARGE DIAGNOSIS:  Acute anterior myocardial infarction with  subsequent stent placement to the left anterior descending (3.0 x 18 mm  Hepacoat stent); status post repeat cardiac catheterization and stent  placement to the left anterior descending with a 3.0 x 23 mm Cypher stent.  Ejection fraction 40% with apical akinesia consistent with early apical  aneurysm.   SECONDARY DISCHARGE DIAGNOSES:  1. History of past tobacco abuse.  2. Hypercholesterolemia.  3. Status post recent lumbar laminectomy.  4. Osteoporosis.  5. Hypertension x25 years.  6. Glucose intolerance.   HISTORY OF PRESENT ILLNESS:  The patient is a very pleasant 60 year old  black female who was admitted from the emergency room on 11/03/01.  She had  recently undergone lumbar laminectomy approximately one week ago.  She  basically did well in her postoperative phase.  On the day prior to  admission she awoke with indigestion and treated herself with antacids  throughout the day.  She thought that they had provided some relief, but she  continued to have soreness in her chest, but attributed that to past nausea  and vomiting that was related to her surgery.  On the night prior to  admission she had a very restless night and described 8/10 chest pain in the  middle of her chest that was radiating somewhat to the left breast.  It was  associated with diaphoresis.  When she presented to the emergency room at  7:30 a.m. EKG there showed changes compatible with an acute anterior  myocardial infarction.  She was  treated with nitroglycerin and heparin, and  her pain diminished to a level of 1 on a scale of 0 to 10.   Please see the dictated history and physical for further patient  presentation and profile.   LABORATORY DATA:  EKG showed acute anterior myocardial infarction.  There  were Q-waves in the inferolateral distribution.   Chest x-ray showed an ill-defined density at the left lung base which had  previously been evaluated with CT scan and was stable compared to a previous  scan on 10/23/01.  The lungs were otherwise clear, the heart size was upper  limits of normal, this was a portable film.   CBC on admission showed a hematocrit of 38, white count was 19,000.  Her  peak cardiac MB was 350.8.   HOSPITAL COURSE:  The patient was taken emergently to the cardiac  catheterization lab from the emergency department.  There, she underwent  angiography.  The left ventricle demonstrated an ejection fraction of 20%.  There was anterior apical akinesis and inferior apical akinesis as well.  The left main was normal.  The LAD was 100% occluded after a large septal  perforating branch.  The left circumflex has a large obtuse marginal branch  with an 80%+ segmental narrowing.  The right coronary artery has minor  irregularities.  A subsequent Hepacoat stent was placed to the left anterior  descending, as well as subsequent angioplasty of the distal LAD to improve a  perfusion across the apex.  Post-procedure, she was transferred to the  coronary care unit where she was kept for the next several days.  She had  intermittent fever of 101 to 102, white count remained elevated, which was  felt to be consistent with the acute event.  Her blood sugars were noted to  be elevated during this admission as well.  She was subsequently switched  over to an ADA diet, blood sugars were checked, as well as a hemoglobin A1C,  which was 5.8.  Total cholesterol panel was drawn which showed total  cholesterol of 212,  triglycerides 92, HDL 83, and LDL of 111.  By 11/07/01,  she was progressing fairly well.  She continued to have some intermittent  fevers as well as some generalized soreness.  We had a short trial of statin  therapy which was subsequently discontinued in light of her multiple  myalgias and fever.  Plans were made at that time to proceed on with repeat  percutaneous coronary intervention on 11/09/01.  On that day, the patient was  taken back to the cardiac catheterization lab.  Ejection fraction is noted  to be improved at 40%.  There is apical akinesis, consistent with an early  apical aneurysm.  The stent in the left anterior descending is patent.  There is segmental flow across the apex.  A stent was placed in the left  circumflex obtuse marginal branch with a 3.0 x 23 mm Cypher that was  inflated to a maximum of 15 atmospheres with an overall satisfactory result.  The patient received IV Integrilin and heparin.  It is our plan that she  will need to be placed on long-term Coumadin therapy which will need to be  initiated in the next two weeks.   Post-procedure she was transferred to 6500.  She initially did well.  Fifteen minutes after the arterial sheath was pulled she developed  discomfort and swelling above the puncture site, consistent with a hematoma.  A stat Doppler study was performed for evaluation for possible  retroperitoneal bleed as well as pseudoaneurysm.  There was noted evidence  of two AV fistulas with no evidence of pseudoaneurysm or retroperitoneal  bleed.  By the time she was seen on afternoon rounds on 11/09/01, she was  doing well.  Her intravenous anticoagulants had been discontinued.  She  remains on bedrest.  The hematoma has stabilized.  Blood pressure and heart  rate are currently satisfactory, and plans will now be made for her to be  discharged in the morning if deemed stable on a.m. rounds.  CONDITION ON DISCHARGE:  Stable.   DISCHARGE MEDICATIONS:  1.  Protonix 40 mg q.d.  2. Plavix 75 mg q.d. x3 weeks.  3. Coated aspirin q.d.[name]  4. Lopressor 50 mg b.i.d.  5. Altace 2.5 mg q.d.  6. Lasix 40 mg q.d.  7. Potassium 20 mEq b.i.d.  8. Nitroglycerin p.r.n.   ACTIVITY:  She is  not to drive.  She may walk 5 to 10 minutes b.i.d.  She is  to avoid the heat.   DIET:  Low fat, low cholesterol, low salt.   WOUND CARE:  She is to place an ice pack as needed to the groin.  She is to  call our office if any problems were to arise in the interim.    FOLLOWUP:  We will have her follow up in the office for a follow up visit in  one week.  As we are coming to the close of her Plavix therapy, we will need  to initiate low dose Coumadin.  We will try to increase her Altace as  tolerated on an outpatient basis, as well as try to re-initiate statin  therapy.                                               Juanell Fairly C. Earl Gala, N.P.    LCO/MEDQ  D:  11/09/2001  T:  11/12/2001  Job:  [id number]   cc:   Minerva Areola L. August Saucer, M.D.

## 2010-08-06 NOTE — Cardiovascular Report (Signed)
[name], AYAD                          ACCOUNT NO.:  [id number]   MEDICAL RECORD NO.:  [id number]                   PATIENT TYPE:  INP   LOCATION:  6531                                 FACILITY:  [facility]   PHYSICIAN:  Colleen Can. Deborah Chalk, M.D.            DATE OF BIRTH:  11/13/1950   DATE OF PROCEDURE:  11/09/2001  DATE OF DISCHARGE:  11/10/2001                              CARDIAC CATHETERIZATION   PROCEDURE PERFORMED:  Cardiac catheterization.   INDICATIONS:  The patient presented with an anterior myocardial infarction  on November 03, 2001 and had angioplasty of the left anterior descending.  She  had residual severe stenosis in the left circumflex and was referred for  intervention.   PROCEDURE:  Left ventricular angiogram with left heart catheterization with  angiography of the left anterior descending coronary and stent placement in  the left circumflex marginal vessel.   TYPE AND SITE OF ENTRY:  Percutaneous right femoral artery.   CATHETERS:  A 6 French pigtail ventricular Caffey cathter, a JL4  7 [place]  guide, Hydro floppy guidewire, 3.0 x 22 mm CYPHER balloon, 3.0 x 20 mm  Maverick balloon.   CONTRAST:  Omnipaque.   MEDICATIONS PRIOR TO PROCEDURE:  Valium 10 mg p.o.   MEDICATIONS DURING PROCEDURE:  Integrilin, heparin, Versed 2 mg IV.   COMMENTS:  The patient tolerated the procedure well.   LEFT VENTRICULOGRAPHY:  The left ventricular angiogram was performed in the  RAO position.  Overall cardiac size was normal but there was apical akinesis  with residual dyskinesia as well.  There was no mural thrombus present.  There was improvement of global ejection fraction from the original  catheterization of one week ago.   CORONARY ANGIOGRAPHY:  1. Left main coronary artery:  The left main coronary artery was essentially     normal.  2. Left anterior descending:  The left anterior descending had a patent     stent.  The stent was actually somewhat larger than the  native vessel but     it was satisfactory.  The diagonal vessels were satisfactory.  As the     left anterior descending crossed the apex it became segmentally narrowed.     It ended on the inferior wall.  3. Left circumflex:  The left circumflex continued as a large system with a     large posterolateral branch and a large marginal vessel that became     tortuous as it approached the apex.   INTERVENTIONAL PROCEDURE:  There was a severe stenosis in the mid portion of  this vessel estimated to be at 80-plus percent.  Initial we tried to pass a  CYPHER stent, do direct angioplasty, but were unable to get the stent to  pass across the lesion.  We returned with a 3.0 x 20 mm Maverick  balloon.  It was inflated to 6 atmospheres, then returned with the CYPHER stent,  positioned it, inflated to 12 and 15 atmospheres, and an excellent result  was obtained.  We were concerned about possible narrowing at the origin of  this marginal vessel but on review, at least a 20-30% narrowing was present  beforehand and the narrowing that persisted after the stent placement was  basically unchanged.   OVERALL IMPRESSION:  1. Anterior apical akinesis and dyskinesia but with improved ejection     fraction from the acute catheterization of November 03, 2001.  2. Successful stent placement in the obtuse marginal branch of the left     circumflex.  3. Persistent patency of the stent in the mid left anterior descending.                                               Colleen Can. Deborah Chalk, M.D.    SNT/MEDQ  D:  11/09/2001  T:  11/12/2001  Job:  [id number]   cc:   L. Lupe Carney, M.D.

## 2010-08-06 NOTE — Discharge Summary (Signed)
[name], [name]                          ACCOUNT NO.:  [id number]   MEDICAL RECORD NO.:  [id number]                   PATIENT TYPE:  OIB   LOCATION:  6533                                 FACILITY:  [facility]   PHYSICIAN:  Colleen Can. Deborah Chalk, M.D.            DATE OF BIRTH:  01/16/1951   DATE OF ADMISSION:  10/07/2002  DATE OF DISCHARGE:  10/08/2002                                 DISCHARGE SUMMARY   PRIMARY DISCHARGE DIAGNOSES:  Significant fatigue with subsequent elective  cardiac catheterization on October 07, 2002 showing anterior apical akinesis  with inferior apical aneurysmal segment, severe stenosis in the mid left  anterior descending just proximal to the previous stent placement with  successful repeat percutaneous intervention and placement of a 2.5 x 13 mm  Cypher stent, and mild coronary disease in the right coronary and left  circumflex otherwise.   SECONDARY DISCHARGE DIAGNOSES:  1. Ischemic heart disease with previous large anterior myocardial infarction     in August of 2003 with stent placement to the proximal left anterior     descending and stent placement to the obtuse marginal branch.  2. Left ventricular dysfunction.  3. Congestive heart failure.  4. Apical aneurysm.  5. Chronic Coumadin.  6. Past tobacco abuse.  7. Hypertension.  8. Hypothyroidism.   HISTORY OF PRESENT ILLNESS:  The patient is a very pleasant 60 year old  black female who has an extensive cardiac history.  She had previous MI  dating back to August of almost one year ago.  She has been followed rather  frequently in the office for recurrent bouts of heart failure.  She has had  ongoing bouts of fatigue.  She has basically been failing on outpatient  therapy and presents for elective cardiac catheterization.   Please see dictated history and physical for further patient presentation  and profile.   LABORATORY DATA:  INR 1.79.  B-peptide 216.  Hemoglobin 11, hematocrit 36,  platelets  197,000.  Chemistries showed sodium 140, potassium 4.4, chloride  105, CO2 25, BUN 30, creatinine 1.7, glucose 96.  Chest x-ray showed no  active disease.   HOSPITAL COURSE:  The patient was admitted to undergo elective cardiac  catheterization.  Procedure was performed on October 07, 2002 without any known  complications.  The results are as noted above.  A subsequent Cypher stent  2.5 x 13 mm was placed just proximal to the previous stent in the left  anterior descending.  Post procedure she was transferred to 6500 and today  on October 08, 2002 she is doing well without complaints.  The groin is  unremarkable.  Overall physical examination is unremarkable.  She has no  post procedural laboratories and she is felt to be a stable candidate for  discharge today.   CONDITION ON DISCHARGE:  Stable.   DISCHARGE MEDICATIONS:  She will continue with all of her current medicines  but we  will add back Plavix 75 mg daily for a total of six months.  Will  decrease her Coumadin to 2.5 three days a week, 5 mg the other days.   ACTIVITY:  Light.  She may return to work half days as of Friday.   DISCHARGE INSTRUCTIONS:  She is to continue to weigh herself each day and  adjust Lasix as according.  She will be seen in the office in approximately  10-14 days.  She will need to have follow-up laboratories a that time.     Juanell Fairly C. Earl Gala, N.P.                 Colleen Can. Deborah Chalk, M.D.    LCO/MEDQ  D:  10/08/2002  T:  10/08/2002  Job:  [id number]   cc:   Merlene Laughter. Renae Gloss, M.D.  [address]  [address]  [place]  [place] [address]  Fax: [phone]    cc:   Merlene Laughter. Renae Gloss, M.D.  [address]  [address]  [place]  [place] [address]  Fax: [phone]

## 2010-08-06 NOTE — Consult Note (Signed)
NAMEHAZELYN, [name]                          ACCOUNT NO.:  [id number]   MEDICAL RECORD NO.:  [id number]                   PATIENT TYPE:  INP   LOCATION:  3733                                 FACILITY:  [facility]   PHYSICIAN:  Pramod P. Pearlean Brownie, MD                 DATE OF BIRTH:  September 01, 1950   DATE OF CONSULTATION:  03/08/2003  DATE OF DISCHARGE:                                   CONSULTATION   REFERRING PHYSICIAN:  Noralee Chars. Jennette Kettle, M.D.   REASON FOR REFERRAL:  Left eye visual loss.   HISTORY OF PRESENT ILLNESS:  Ms. Schweppe is a 60 year old pleasant African-  American lady who had a sudden onset of tenderness, visual loss on the left  side today.  The patient states that she was sitting at home at about 3 p.m.  when she had sudden onset of loss of vision in the left eye.  She states she  initially saw what she thought was a spot of dirt in front of her eyes.  It  dried up in her eyes it did not go away.  When she closed the right eye she  realized that she could not see out of the left eye.  This started gradually  and spread within several minutes to involve the whole eye.  She did not  have any immediate pain, but after the episode resolved, in one hour, she  has noticed a left orbital and frontal headache.  She denies any other  neurological symptoms in the form of double vision, vertigo, slurred speech,  focal external weakness or numbness.  There was no prior history of stroke,  TIA, migraine headaches, seizures, or any other neurological problems.   PAST MEDICAL HISTORY:  Significant for ischemic artery disease status post  large anterior myocardial infarction in August 2003 with stent placement to  LAD and subsequent to obtuse marginal branch.  She has inferior apical  hypokinesis with aneurysmal formation for which she has been on Coumadin.  Her ejection fraction on an echo from November 2003 was 25-35%.  She also  has past medical history of hypertension, hypothyroidism,  glucose  intolerance, hyperlipidemia, and osteoporosis.   PAST SURGICAL HISTORY:  Thyroid surgery and lumbar laminectomy.   MEDICATION ALLERGIES:  PENICILLIN and LIPITOR.   CURRENT MEDICATIONS:  1. Protonix.  2. Aspirin.  3. Plavix.  4. Coumadin.  5. Lasix.  6. Arthrotec.  7. Potassium.  8. Coreg.  9. Lisinopril.  10.      Pravachol.  11.      Allegra.  12.      Synthroid.  13.      Nitro paste.  14.      Zaroxolyn as needed.   FAMILY HISTORY:  Family history is significant for ischemic heart disease in  multiple members on the mother's side.  No history of stroke.   SOCIAL HISTORY:  The patient is  widowed.  She lives by herself in  [place].  She does not smoke or drink.  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   REVIEW OF SYSTEMS:  Not significant for recent fever, loss of weight, cough,  or diarrhea.  She has chronic fatigue and has had multiple admissions for  congestive heart failure in the past.   PHYSICAL EXAMINATION:  GENERAL:  Physical exam reveals a pleasant, middle-  aged lady who is not in distress at present.  VITAL SIGNS:  She is afebrile with pulse rate of 76/minute which is regular  and sinus.  Her respiratory rate is 16/minute.  Distal pulses are well felt.  HEENT:  Head is atraumatic.  ENT exam is unremarkable.  NECK:  Neck is supple without bruits.  CARDIAC:  Cardiac exam no murmurs or gallops.  LUNGS:  Clear to auscultation.  NEUROLOGIC:  She is awake, alert, and oriented x3 with normal speech and  language function.  There is no aphasia, apraxia or dysarthria.  Pupils are  equal, 5 mm, briskly reactive to light and accommodation.  Visual acuity and  __________ are adequate.  Face is symmetric.  Palate elevates normally.  Tongue is midline.  Motor system exam reveals no upper extremity drift,  normal symmetric strength, tone, reflexes, coordination and sensation.  She  walks.  Finger-to-nose and heel-toe coordination are accurate.   Gait was not  tested.   DATA REVIEW:  Admission labs from today are pending.  CT scan of the brain  done  no acute old infarction and is within normal limits.  Admission notes  and discharge summary from July 2004 were reviewed.   IMPRESSION:  A 60 year old lady with multiple vascular risk factors.  Ischemic heart disease, hyperlipidemia, borderline diabetes, hypertension,  and family history of vascular disease presents with sudden onset of  transient episode of monocular blindness involving the left eye might be  amaurosis fugax. Etiology possible cardiogenic embolism from apical  aneurysmal segment.  Occlusive carotid disease also needs to be ruled out.   PLAN:  I would recommend admitting the patient for further stroke risk  stratification workup.  Check PT/INR and if INR is below 2 start her on IV  heparin and our goal of 2-3.  MRI scan of the brain with MRA of the brain  and neck,  hemoglobin A1c, lipid profile and homocystine level.  Cardiac:  Echocardiogram unless resent one is available.  I had a long discussion with  the patient regards __________ .  Discussed plan for evaluation, treatment,  and answered questions.                                               Pramod P. Pearlean Brownie, MD    PPS/MEDQ  D:  03/08/2003  T:  03/10/2003  Job:  [id number]   cc:   Minerva Areola L. August Saucer, M.D.  [address] [address]  [place]  [place] [address]  Fax: [phone]   Colleen Can. Deborah Chalk, M.D.  Fax: [phone]

## 2010-08-06 NOTE — Discharge Summary (Signed)
[name], [name]                          ACCOUNT NO.:  [id number]   MEDICAL RECORD NO.:  [id number]                   PATIENT TYPE:  INP   LOCATION:  3733                                 FACILITY:  [facility]   PHYSICIAN:  Pramod P. Pearlean Brownie, MD                 DATE OF BIRTH:  01/09/1951   DATE OF ADMISSION:  03/08/2003  DATE OF DISCHARGE:  03/12/2003                                 DISCHARGE SUMMARY   DISCHARGE DIAGNOSES:  1. Right brain transient ischemic attack with transient visual loss.  2. 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  3. Inferior hypokinesis with aneurysmal formation for which she was on     Coumadin.  4. Ejection fraction 30 to 40%, improved since November 2003, when it was 25     to 35%.  5. Hypertension.  6. Hypothyroidism.  7. Glucose intolerance.  8. Hyperlipidemia.  9. Osteoporosis.  10.      Hyperhomocysteinemia.   DISCHARGE MEDICATIONS:  1. Prinivil 2.5 mg a day.  2. Coreg 12.5 mg a day.  3. Coreg 6.25 mg at bedtime.  4. Lasix 40 mg a day.  5. Pravachol 40 mg a day.  6. Synthroid 100 mcg a day.  7. Allegra 1 a day.  8. Potassium 20 mEq a day.  9. Protonix 40 mg a day.  10.      Arthrotec b.i.d.  11.      Foltx 1 a day.  12.      Tricor 54 mg a day.  13.      Voltaren 75 mg a day.  14.      Coumadin 5 mg on March 12, 2003, then resume 5 mg Tuesday,     Thursday, Saturday, and Sunday alternating with 2.5 mg Monday, Wednesday,     and Friday.  15.      Lovenox 70 mg subcutaneously q.12h. x 6 doses.   STUDIES:  1. CT of brain on admission showed no acute stroke.  2. MRI shows no acute stroke.  3. MRA of the head shows moderate to severe stenosis in the right M1 and     left A1 segments.  Right PCA is occluded.  4. MRA of the neck shows origin of the common carotid artery stenosis and     also origin of the vertebral artery stenosis.  5.  Carotid Doppler is normal.  6. A 2-D echocardiogram shows ejection fraction of 30 to 40% with dyskinesis     anterior apical wall with non-mobile thrombosis in the apical areas of     left ventricle.  This is essentially unchanged from last study which was     in Becky.  7. EKG shows normal sinus rhythm with occasional PVCs.  8. Cholesterol 160, triglycerides 204, HDL 55, LDL 64, homocysteine 15.44,  glucose 84.  Hemoglobin A1C 6.0.  INR day of discharge 1.4.  9. Hemoglobin 10.4, hematocrit 30.3, white blood cells 4.7, platelets 147,     neutrophils low at 29, lymphocytes high at 55, monocytes high at 12.  10.      Chemistries normal.  11.      Liver function tests normal.  12.      CK slightly elevated 372, 341, 434, 571.  Troponin I were negative     as were CK-MB.   HISTORY OF PRESENT ILLNESS:  [name] is a 60 year old, right-  handed, black female who had sudden loss of vision to her left at 3 p.m. on  the day of admission.  She initially felt what she saw was spot of dirt in  front of her eyes.  It dried up in her eyes and did not go away.  When she  covered the other eye, she could not see out of the left eye.  This started  gradually and spread within several minutes to involve the whole eye.  She  did not have any pain.  She did notice a left orbital and left frontal  headache.  She denies any other neurological symptoms and has no history of  stroke, migraine headaches, or seizures.  She was admitted to the hospital  for further stroke workup. She was not tPA or ________ candidate to time and  lack of severity of symptoms.   HOSPITAL COURSE:  MRI did not reveal an acute stroke and was felt to be a  right brain TIA secondary to subtherapeutic INR.  The patient was on  Coumadin prior to admission with history of an apical hypokinesis and  aneurysm since November 2003.  She had been taken off Coumadin for a  colonoscopy and had been restarted on Coumadin for three  days.  Her INR on  admission was 1.4.  Dr. Delfin [name] was consulted during her  hospitalization, and he agreed with continuation of Coumadin for heart as  well as stroke prophylaxis.  The patient was on Plavix prior to admission,  and this was stopped since it had been six months since her stent had been  placed.   During hospitalization, the patient's symptoms resolved.  Her INR was noted  to return to greater than 2.  It was agreed between cardiology and neurology  to discharge her on Lovenox with followup on Friday.  [facility] will follow  Coumadin levels through the weekend, and then Dr. Deborah [name] will pick up on  Monday after Christmas.   Other risk factors identified during hospitalization included elevated  homocysteine for which she was started on Foltx.  Also, Tricor was added for  elevated triglycerides.   DISCHARGE INSTRUCTIONS:  1. Discharge home.  2. Lovenox for 3 days.  3. She will take an additional 5 mg of Coumadin the day of discharge and     then resume her normal schedule.  4. INR is to be checked at the hospital on Friday, December 24, with [facility]     following results.  5. Adjustments will be made, and Dr. Deborah [name] will resume managing Coumadin     on Monday, December 27, with a lab draw in his office.  6. Follow up with Dr. Pearlean Brownie in two months.  7. Follow up with Dr. Deborah [name] in one month.  8. Follow up with Dr. August [name] in one month.  9. May return to work March 24, 2003.      Becky December  [name], N.P.                         Pramod P. Pearlean Brownie, MD    SB/MEDQ  D:  03/12/2003  T:  03/14/2003  Job:  [id number]   cc:   Colleen Can. [name], M.D.  Fax: [phone]   Lind Guest. [name], M.D.  [address] [address]  [place]  [place] [address]  Fax: [phone]

## 2010-08-06 NOTE — Discharge Summary (Signed)
[name], [name] NO.:  [id number]   MEDICAL RECORD NO.:  [id number]          PATIENT TYPE:  INP   LOCATION:  2899                         FACILITY:  [facility]   PHYSICIAN:  Duke Salvia, M.D.  DATE OF BIRTH:  Jan 18, 1951   DATE OF ADMISSION:  11/05/2004  DATE OF DISCHARGE:  11/05/2004                                 DISCHARGE SUMMARY   ALLERGIES:  PENICILLIN.   DISCHARGE DIAGNOSES:  1.  Discharging the day of successful replacement of recall of Guidant      VITALITY T135VR with a Guidant VITALITY 2 EL cardioverter defibrillator,      model [id number] by Dr. Sherryl Manges.  2.  Silicone adherent dressing placed over the wound at the time of      procedure to be continued for six weeks to avoid keloid formation.   SECONDARY DIAGNOSES:  1.  History of myocardial infarction status post stent to the right coronary      artery.  2.  Repeat catheterization showing in-stent restenosis.  Repeat stenting      2004.  3.  Ischemic cardiomyopathy, ejection fraction 29%.  4.  Class II to III congestive heart failure symptoms.  5.  Hypothyroidism.  6.  History of transient ischemic attacks which cause blurred vision in the      left eye.  The patient has never had ICD discharges.   PROCEDURES:  November 05, 2004.  Guidant VITALITY device implanted Jul 31, 2003 is on recall.  The device is at risk for premature battery depletion.   DISCHARGE DISPOSITION:  The patient discharging on the day implantation.  The patient is having only mild discomfort at the pacer site.  A silicone  adherent dressing has been placed.  She will remove the bandage on Saturday  morning, August 19, and keep the silicone in place.  As it looses it  adherence, she will cut a new patch to cover the incision, and continue this  for the next six weeks.  She is asked not to get the incision wet for the  next seven days and sponge bathe until Friday, August 25.  She is avoid  vigorous movement with the  left arm for the next two days.   DISCHARGE DIET:  Low sodium, low cholesterol diet.  She is asked not to do  any lifting for the next two weeks.   Discharging on the following medications:  1.  Lasix 80 mg daily.  2.  Synthroid 0.1 mg daily.  3.  Arthrotek 75 mg daily.  4.  Potassium chloride 20 mEq daily.  5.  Lisinopril 10 mg twice daily.  6.  Pravachol 40 mg at bedtime daily.  7.  Coumadin as before this admission.  8.  Enteric coated aspirin 325 mg daily.  9.  Foltx daily.  10. Allegra 180 mg daily.  11. Coreg 6.25 mg twice daily.  12. New medication:  Clindamycin 150 mg four times daily; breakfast, lunch,      dinner, and bedtime for the next five days.  13. Silicone dressing to maintain in place  over the incision for the next      six weeks.   She has followup in the ICD clinic Wednesday, September 6 at 9 o'clock in  the morning.  This is at [facility], [address].   BRIEF HISTORY:  Ms. Mallette is a 60 year old African-American female.  She has  a history of previous myocardial infarction after back surgery.  Required  stenting x2 to the right coronary artery and this was done in 2003.  She has  ischemic cardiomyopathy and has been hospitalized for exacerbations of  congestive heart failure which have rated as Class II to III.  Her ejection  fraction is estimated at 29%.  She has implant of Guidant VITALITY T135VR on  Jul 31, 2003 for primary prevention of sudden cardiac death.  On  interrogation, she has had two episodes of ventricular tachycardia requiring  ATP.  Her device is at risk for premature battery depletion.  The patient  presents for change out.   Hospital course is as described above.  The patient had a successful change  of her Guidant generator for ICD.  She has had no post procedure  complications and maintaining sinus rhythm.  She goes home with the  medications and followup as dictated.      Maple Mirza, P.A.     ______________________________  Duke Salvia, M.D.    GM/MEDQ  D:  11/05/2004  T:  11/05/2004  Job:  [id number]   cc:   Dr. Tomasita Morrow. Deborah Chalk, M.D.  Fax: [phone]

## 2010-08-06 NOTE — Telephone Encounter (Signed)
Left message for pt that Dr. Deborah Chalk signed the clearance for pt's colostomy revision.  RN faxed clearance to [facility].  Pt told to call back with any concerns.

## 2010-08-09 ENCOUNTER — Encounter: Payer: Medicare Other | Admitting: *Deleted

## 2010-08-10 ENCOUNTER — Ambulatory Visit
Admission: RE | Admit: 2010-08-10 | Discharge: 2010-08-10 | Disposition: A | Discharge status: Home / Self Care | Payer: Medicare Other | Source: Ambulatory Visit | Attending: Surgery | Admitting: Surgery

## 2010-08-10 DIAGNOSIS — K5792 Diverticulitis of intestine, part unspecified, without perforation or abscess without bleeding: Secondary | ICD-10-CM

## 2010-08-10 DIAGNOSIS — Z933 Colostomy status: Secondary | ICD-10-CM

## 2010-08-12 ENCOUNTER — Other Ambulatory Visit: Payer: Self-pay | Admitting: *Deleted

## 2010-08-12 MED ORDER — LISINOPRIL 2.5 MG PO TABS: 2.5000 mg | ORAL_TABLET | Freq: Two times a day (BID) | ORAL | Status: DC

## 2010-08-17 ENCOUNTER — Telehealth: Payer: Self-pay | Admitting: *Deleted

## 2010-08-17 NOTE — Telephone Encounter (Signed)
LMOM for pt to call and schedule an INR

## 2010-08-23 ENCOUNTER — Ambulatory Visit (INDEPENDENT_AMBULATORY_CARE_PROVIDER_SITE_OTHER): Payer: Medicare Other | Admitting: *Deleted

## 2010-08-23 ENCOUNTER — Telehealth: Payer: Self-pay | Admitting: *Deleted

## 2010-08-23 DIAGNOSIS — I255 Ischemic cardiomyopathy: Secondary | ICD-10-CM

## 2010-08-23 DIAGNOSIS — R0989 Other specified symptoms and signs involving the circulatory and respiratory systems: Secondary | ICD-10-CM

## 2010-08-23 DIAGNOSIS — I2589 Other forms of chronic ischemic heart disease: Secondary | ICD-10-CM

## 2010-08-23 LAB — POCT INR: INR: 4

## 2010-08-23 NOTE — Telephone Encounter (Signed)
LMOM for pt to call back and schedule and INR appt

## 2010-08-23 NOTE — Patient Instructions (Signed)
Pt notified of Coumadin instructions in the office and pt verbalized understanding to RN of instructions.

## 2010-09-01 ENCOUNTER — Ambulatory Visit (INDEPENDENT_AMBULATORY_CARE_PROVIDER_SITE_OTHER): Payer: Medicare Other | Admitting: *Deleted

## 2010-09-01 DIAGNOSIS — I255 Ischemic cardiomyopathy: Secondary | ICD-10-CM

## 2010-09-01 DIAGNOSIS — I2589 Other forms of chronic ischemic heart disease: Secondary | ICD-10-CM

## 2010-09-01 LAB — POCT INR: INR: 2.2

## 2010-09-15 ENCOUNTER — Encounter (INDEPENDENT_AMBULATORY_CARE_PROVIDER_SITE_OTHER): Payer: Self-pay

## 2010-09-17 ENCOUNTER — Encounter (INDEPENDENT_AMBULATORY_CARE_PROVIDER_SITE_OTHER): Payer: Self-pay | Admitting: Surgery

## 2010-09-29 ENCOUNTER — Ambulatory Visit (INDEPENDENT_AMBULATORY_CARE_PROVIDER_SITE_OTHER): Payer: Medicare Other | Admitting: *Deleted

## 2010-09-29 DIAGNOSIS — I255 Ischemic cardiomyopathy: Secondary | ICD-10-CM

## 2010-09-29 DIAGNOSIS — I2589 Other forms of chronic ischemic heart disease: Secondary | ICD-10-CM

## 2010-09-30 ENCOUNTER — Ambulatory Visit (INDEPENDENT_AMBULATORY_CARE_PROVIDER_SITE_OTHER): Payer: Medicare Other | Admitting: Surgery

## 2010-09-30 ENCOUNTER — Encounter (INDEPENDENT_AMBULATORY_CARE_PROVIDER_SITE_OTHER): Payer: Self-pay | Admitting: Surgery

## 2010-09-30 DIAGNOSIS — Z933 Colostomy status: Secondary | ICD-10-CM

## 2010-09-30 DIAGNOSIS — Z8719 Personal history of other diseases of the digestive system: Secondary | ICD-10-CM

## 2010-09-30 NOTE — Progress Notes (Signed)
HPI: [name] is a 60 year old African American female who sees Dr. Sanda Linger as her primary care doctor. She sees Dr. Willis Modena from a GI standpoint. She saw Dr. Delfin Edis until his retirement, she now is to see Dr. Angelina Sheriff of [facility]. Dr. Kristeen Miss sees her from an orthopedic standpoint and Dr. Metro Kung Caesar for her back.  She was admitted to [facility] for a bowel obstruction in January 2012. At the time, she was recovering from lumbar disk surgery that Dr. Lannie Fields did in November 2011.  I did a sigmoid colectomy and end colostomy on 25 March 2010 for an obstructing sigmoid colon diverticular stricture. There was no evidence of malignancy.  She is now interested in having her colostomy reversed.  I did a barium enema on 10 Aug 2010. There appears to be adequate rectal stump for the anastomosis. She has done well with her colostomy administered and has been able to eat a wider range of foods since the surgery.  ROS: Neurologic: She has a history of a prior TIA.  Cardiac: She has a diagnosis of ischemic cardiomyopathy with a 20-30% ejection fraction. She has had several MIs in the past. She had a defibrillator placed approximately 10 years ago. (2002). The defibrillator has gone off one time, about 3 years ago. She was followed by Dr. Delfin Edis until his retirement, he has forwared a note clearing her for surgery.  She will be followed in the future by Dr. Angelina Sheriff.  She has not met him yet. Pulmonary:  She has no chronic disease or asthma. GI:  See history of present illness. Urologic:  No history of kidney disease or stone. Musculoskeletal:  She had a lumbar fusion in November 2011 by Dr. Lannie Fields.  It was during this recovery she developed her bowel obstruction.  She has completed her PT for her back and is in much better shape that she was in the hospital in January.  She had a left total knee replacement in 2005. Hematologic:  On  chronic coumadin.  Also on NSAIDs.  She's accompanied by her husband in our office.  PE: General:  Well nourished AA female.  In a good mood today. Lungs:  Clear to auscultation.  Symmetric breath sounds. Heart:  RRR.  Defibrillator left upper chest. Abdomen:  Left upper quadrant colostomy.  Well healed midline incision.  No hernias. No mass. No tenderness. Extremities:  Good strength in upper and lower extremities.  Assessment and Plan: #1.  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  Plan reversal of colostomy at [facility] in 2 weeks.  (14 October 2010)  Discussed indications and complications of surgery.  Complications include bleeding, infection, bowel leak, unable to reverse colostomy, or cardiac event.  She will be in the ICU post op for monitoring.  Given bowel prep.  She'll be on the Entereg protocol.  #2.  CAD.  History of MIs.  History of coronary stents.  History of EF of 20 - 30%. #3.  Defibrillator.  Will need to arrange to turn off for surgery. #4.  On chronic coumadin.  Will stop 7 days prior to surgery.  She did the same for her back surgery.  Will also stop aspirin and Arthritec. #5.  History of TIAs. #6.  Recovered well from lumbar disk surgery in Nov. 2011.

## 2010-09-30 NOTE — Patient Instructions (Signed)
Stop coumadin, aspirin, and Arthritec 1 week prior to surgery.  You have the bowel prep and antibiotics to take the day before surgery.  Fleets enema the day or 2 before surgery.  You should see an anesthesiologists before the surgery.

## 2010-10-05 ENCOUNTER — Telehealth: Payer: Self-pay | Admitting: Nurse Practitioner

## 2010-10-05 ENCOUNTER — Encounter (HOSPITAL_COMMUNITY)
Admission: RE | Admit: 2010-10-05 | Discharge: 2010-10-05 | Disposition: A | Discharge status: Home / Self Care | Payer: Medicare Other | Source: Ambulatory Visit | Attending: Surgery | Admitting: Surgery

## 2010-10-05 DIAGNOSIS — Z01812 Encounter for preprocedural laboratory examination: Secondary | ICD-10-CM | POA: Insufficient documentation

## 2010-10-05 DIAGNOSIS — Z538 Procedure and treatment not carried out for other reasons: Secondary | ICD-10-CM | POA: Insufficient documentation

## 2010-10-05 LAB — COMPREHENSIVE METABOLIC PANEL
Albumin: 4.4 g/dL (ref 3.5–5.2)
BUN: 22 mg/dL (ref 6–23)
Creatinine, Ser: 1.13 mg/dL — ABNORMAL HIGH (ref 0.50–1.10)
Potassium: 4.2 mEq/L (ref 3.5–5.1)
Total Protein: 7.8 g/dL (ref 6.0–8.3)

## 2010-10-05 LAB — DIFFERENTIAL
Basophils Absolute: 0 10*3/uL (ref 0.0–0.1)
Eosinophils Relative: 0 % (ref 0–5)
Lymphocytes Relative: 15 % (ref 12–46)
Monocytes Absolute: 0.5 10*3/uL (ref 0.1–1.0)

## 2010-10-05 LAB — CBC
HCT: 36.9 % (ref 36.0–46.0)
MCHC: 36 g/dL (ref 30.0–36.0)
MCV: 102.8 fL — ABNORMAL HIGH (ref 78.0–100.0)
RDW: 15.4 % (ref 11.5–15.5)

## 2010-10-05 LAB — SURGICAL PCR SCREEN
MRSA, PCR: NEGATIVE
Staphylococcus aureus: NEGATIVE

## 2010-10-05 NOTE — Telephone Encounter (Signed)
SHORT STAY ASKING FOR LAST OV NOTE,EKG AND ANY CARDIAC TESTING TO BE FAXED.

## 2010-10-06 ENCOUNTER — Telehealth (INDEPENDENT_AMBULATORY_CARE_PROVIDER_SITE_OTHER): Payer: Self-pay

## 2010-10-06 NOTE — Telephone Encounter (Signed)
I called patient because we received request from [facility] via fax to fill her Nulytely bowel prep.  She states she needs it filled.  I faxed the ok back

## 2010-10-07 ENCOUNTER — Encounter: Payer: Self-pay | Admitting: Physician Assistant

## 2010-10-07 ENCOUNTER — Ambulatory Visit (INDEPENDENT_AMBULATORY_CARE_PROVIDER_SITE_OTHER): Payer: Medicare Other | Admitting: Physician Assistant

## 2010-10-07 ENCOUNTER — Other Ambulatory Visit: Payer: Self-pay | Admitting: Internal Medicine

## 2010-10-07 ENCOUNTER — Encounter (INDEPENDENT_AMBULATORY_CARE_PROVIDER_SITE_OTHER): Payer: Medicare Other | Admitting: *Deleted

## 2010-10-07 DIAGNOSIS — I219 Acute myocardial infarction, unspecified: Secondary | ICD-10-CM

## 2010-10-07 DIAGNOSIS — I428 Other cardiomyopathies: Secondary | ICD-10-CM

## 2010-10-07 DIAGNOSIS — I5022 Chronic systolic (congestive) heart failure: Secondary | ICD-10-CM

## 2010-10-07 DIAGNOSIS — I513 Intracardiac thrombosis, not elsewhere classified: Secondary | ICD-10-CM

## 2010-10-07 DIAGNOSIS — I639 Cerebral infarction, unspecified: Secondary | ICD-10-CM

## 2010-10-07 DIAGNOSIS — I10 Essential (primary) hypertension: Secondary | ICD-10-CM

## 2010-10-07 DIAGNOSIS — E785 Hyperlipidemia, unspecified: Secondary | ICD-10-CM

## 2010-10-07 DIAGNOSIS — I255 Ischemic cardiomyopathy: Secondary | ICD-10-CM

## 2010-10-07 DIAGNOSIS — I2510 Atherosclerotic heart disease of native coronary artery without angina pectoris: Secondary | ICD-10-CM

## 2010-10-07 DIAGNOSIS — Z95810 Presence of automatic (implantable) cardiac defibrillator: Secondary | ICD-10-CM

## 2010-10-07 LAB — ICD DEVICE OBSERVATION
BATTERY VOLTAGE: 2.65 V
DEVICE MODEL ICD: 107771
FVT: 0
HV IMPEDENCE: 46 Ohm
PACEART VT: 0
RV LEAD THRESHOLD: 0.4 V
RV LEAD THRESHOLD: 0.8 V
RV LEAD THRESHOLD: 0.8 V
TZAT-0001FASTVT: 1
TZAT-0001SLOWVT: 1
TZAT-0001SLOWVT: 2
TZAT-0004SLOWVT: 8
TZAT-0004SLOWVT: 8
TZAT-0005FASTVT: 81 pct
TZAT-0005SLOWVT: 81 pct
TZAT-0012FASTVT: 200 ms
TZAT-0013FASTVT: 2
TZAT-0018FASTVT: NEGATIVE
TZAT-0018SLOWVT: NEGATIVE
TZAT-0019FASTVT: 7.5 V
TZAT-0019FASTVT: 7.5 V
TZAT-0019SLOWVT: 7.5 V
TZAT-0020FASTVT: 1 ms
TZAT-0020FASTVT: 1 ms
TZON-0003FASTVT: 300 ms
TZON-0004FASTVT: 2.5
TZON-0005SLOWVT: 1
TZST-0001FASTVT: 3
TZST-0001FASTVT: 5
TZST-0001FASTVT: 7
TZST-0001SLOWVT: 4
TZST-0001SLOWVT: 7
TZST-0003FASTVT: 31 J
TZST-0003FASTVT: 31 J
TZST-0003FASTVT: 31 J
TZST-0003SLOWVT: 31 J
TZST-0003SLOWVT: 31 J
TZST-0003SLOWVT: 31 J

## 2010-10-07 MED ORDER — ENOXAPARIN SODIUM 100 MG/ML ~~LOC~~ SOLN: 100.0000 mg | SUBCUTANEOUS | Status: DC

## 2010-10-07 NOTE — Assessment & Plan Note (Signed)
As noted, her stroke risk is high without coumadin.  We will arrange for Lovenox.  She will be placed on 1.5 mg/kg QD.  She will take her last dose the day prior to her surgery.

## 2010-10-07 NOTE — Assessment & Plan Note (Addendum)
She currently is not having any unstable cardiac conditions.  She is able to achieve 4 METS or greater without chest pain or dyspnea.  Her last heart catheterization in March 2012 demonstrated patent stents and no significant obstruction.  Her volume status is stable.  According to [facility] and AHA guidelines, she requires no further cardiac workup prior to noncardiac surgery.  She should be acceptable risk.  Our service will be available in the perioperative period as necessary.  Continue beta blocker throughout the perioperative period

## 2010-10-07 NOTE — Assessment & Plan Note (Addendum)
Her device was interrogated today.  She has had a few episodes of NSVT (4-5 beats).  She has not had any sustained episodes.  She has not been treated with anti-tachycardic pacing or defibrillation.  [employer] can be contacted in the perioperative period as necessary for assistance with her device.

## 2010-10-07 NOTE — Assessment & Plan Note (Signed)
Controlled.  Continue current therapy.  

## 2010-10-07 NOTE — Assessment & Plan Note (Signed)
Volume status is stable.  Continue beta blocker, ACE inhibitor, diuretic.  Consider Spironolactone in the future after her surgery.

## 2010-10-07 NOTE — Assessment & Plan Note (Signed)
Her thromboembolic risk factor profile is significantly elevated.  CHADS2-VASc = 6 (CHF, hypertension, stroke, gender, vascular disease).  Therefore, I favor covering her with Lovenox prior to her surgery.  I will arrange for her to see the Coumadin clinic today so that this can be arranged.  She will need anticoagulation started postoperatively when felt to be safe by surgery.  Our service will be available as necessary in the perioperative period.

## 2010-10-07 NOTE — Assessment & Plan Note (Signed)
The patient has a true allergy to statins.  She continues on Zetia.  Last LDL was 138 in March 2012.

## 2010-10-07 NOTE — Progress Notes (Signed)
History of Present Illness: Primary Cardiologist:  Dr. Rollene Rotunda PCP:  Dr. Sanda Linger  [name] is a 60 y.o. female who is a prior patient of Dr. Deborah Chalk.  She is to now follow with Dr. Antoine Poche.   Her past medical history includes CAD, status post anterior myocardial infarction treated with a stent in the LAD and staged Cypher drug eluting stenting to the OM-1 in 8/03.  She then had a Cypher drug-eluting stent placed to the LAD in 7/04.  She's had multiple cardiac catheterizations in the past.  She has a chronically abnormal EKG.  Her last heart catheterization was 06/17/10.  She presented to Dr. Ronnald Nian office with chest pain and an EKG was concerning for ST elevation myocardial infarction.  Cardiac catheterization at that time demonstrated a patent stent in the LAD and a patent stent in the OM1 with nonobstructive disease elsewhere.  She did have 80% stenosis in a small ostial Dx-1.  She also has an ischemic cardiomyopathy.  Her last echocardiogram 06/08/10: EF 25-30%, anteroseptal and apical akinesis, moderate AI and mild MR.  She is status post AICD implantation.  She has a history of systolic heart failure.  She has a history of left ventricular mural apical thrombus and has been on Coumadin therapy for several years.  Other history includes SVT (? Atrial fibrillation), on amiodarone, hyperlipidemia, hypertension, chronic kidney disease stage III, GERD, hypothyroidism and history of stroke.  She had a partial colectomy in January 2012 the setting of partial bowel obstruction.  She has a colostomy and has recently seen Dr. Ezzard Standing.  She plans to have a colostomy takedown and presents for surgical clearance.  Surgery scheduled for 7/26.  She denies any chest pain or dyspnea since her last cath.  She is as active as she can be.  She does her own housework.  She can climb steps or vacuum a room without chest pain or dyspnea.  She denies syncope.  No orthopnea, PND, edema.  No ICD discharges.   Although, she thought she heard her ICD alarm recently.  No palpitations.  Past Medical History  Diagnosis Date  . Spinal stenosis   . Numbness of foot     left foot  . Diverticulitis     Status post partial colectomy 1/12  . Colitis, ischemic   . DJD (degenerative joint disease)     History of multiple surgeries to the back, shoulder and knee  . Ischemic cardiomyopathy     Echo 06/16/10: EF 25-30%, anteroseptal and apical hypokinesis, moderate AI, mild MR  . Chronic systolic heart failure   . CAD (coronary artery disease)     a.  Ant MI 8/03 with stenting of the LAD;  b. staged PCI with Cypher DES to OM1 8/03;  c. s/p Cypher DES to LAD 7/04;  d.  multiple cardiac caths in past (chronically abnl ECG);    e. cardiac catheterization 3/12: LAD stent patent, distal LAD 40-50%, small ostial D1 80%, ostial D2 60%, OM-1 stent patent (20-30%), proximal RCA 50%, proximal to mid RCA 40-50%  . LV (left ventricular) mural thrombus     Chronic Coumadin therapy  . Stroke     History of TIA and possible CVA; hospitalized 2004  . CKD (chronic kidney disease), stage III     creatinine:  1.3 in 4/12;    . Tobacco abuse     history  . GERD (gastroesophageal reflux disease)   . Hypertension   . Lower GI bleed  August 2011  . SVT (supraventricular tachycardia)     ? h/o AFib;  patient on long term amiodarone therapy  . Hypothyroidism   . Aortic insufficiency   . Pseudoaneurysm     History of, right forearm  . ICD (implantable cardiac defibrillator), single, in situ     [employer];  2008    Current Outpatient Prescriptions  Medication Sig Dispense Refill  . amiodarone (PACERONE) 200 MG tablet Take 200 mg by mouth daily.        [name] aspirin 81 MG tablet Take 81 mg by mouth daily.        . carvedilol (COREG CR) 20 MG 24 hr capsule Take 20 mg by mouth daily.        . cyclobenzaprine (FLEXERIL) 5 MG tablet Take 5 mg by mouth as needed.   30 tablet  0  . Diclofenac-Misoprostol (ARTHROTEC PO)  Take 75 mg by mouth daily.        [name] ezetimibe (ZETIA) 10 MG tablet Take 10 mg by mouth daily.        [name] FA-Pyridoxine-Cyancobalamin (FOLTX PO) Take by mouth daily.        . fish oil-omega-3 fatty acids 1000 MG capsule Take 1,000 mg by mouth daily.        . fluticasone (FLONASE) 50 MCG/ACT nasal spray 2 sprays by Nasal route as needed.        . furosemide (LASIX) 40 MG tablet Take 40 mg by mouth daily.        [name] levothyroxine (SYNTHROID, LEVOTHROID) 200 MCG tablet Take 200 mcg by mouth daily.        [name] lisinopril (PRINIVIL,ZESTRIL) 2.5 MG tablet Take 1 tablet (2.5 mg total) by mouth 2 (two) times daily.  180 tablet  2  . Methocarbamol (ROBAXIN PO) Take by mouth as needed.        . nitroGLYCERIN (NITROSTAT) 0.4 MG SL tablet Place 1 tablet (0.4 mg total) under the tongue every 5 (five) minutes as needed for chest pain.  25 tablet  3  . Oxycodone-Acetaminophen (PERCOCET PO) Take by mouth as needed.        . Pantoprazole Sodium (PROTONIX PO) Take by mouth daily.        . potassium chloride SA (K-DUR,KLOR-CON) 20 MEQ tablet Take 20 mEq by mouth daily.        [name] warfarin (COUMADIN) 3 MG tablet TAKE AS DIRECTED  135 tablet  1    Allergies: Allergies  Allergen Reactions  . Penicillins   . Statins     Social history:  Ex-smoker.  She is widowed.  Lives by herself.  Family history:  Positive for CAD  ROS:  See history of present illness.  All other systems reviewed and negative.  Vital Signs: BP 124/82  Pulse 66  Ht 5\' 6"  (1.676 m)  Wt 150 lb (68.04 kg)  BMI 24.21 kg/m2  PHYSICAL EXAM: Well nourished, well developed, in no acute distress HEENT: normal Neck: no JVD Endocrine:No thyromegaly Vascular:No carotid bruits Cardiac:  normal S1, S2; RRR; no murmur Lungs:  clear to auscultation bilaterally, no wheezing, rhonchi or rales Abd: soft, nontender, no hepatomegaly, colostomy bag in place (brown stool noted) Ext: no edema Skin: warm and dry Neuro:  CNs 2-12 intact, no focal  abnormalities noted Psych:  Normal affect  EKG:  Sinus rhythm, heart rate 66, leftward axis, inferior Q waves, anterior Q waves, nonspecific ST-T wave changes, no significant change when compared to prior tracing 3/12  ASSESSMENT AND PLAN:

## 2010-10-14 ENCOUNTER — Inpatient Hospital Stay (HOSPITAL_COMMUNITY)
Admission: RE | Admit: 2010-10-14 | Discharge: 2010-10-21 | DRG: 336 | Disposition: A | Discharge status: Home Health | Payer: Medicare Other | Source: Ambulatory Visit | Attending: Surgery | Admitting: Surgery

## 2010-10-14 ENCOUNTER — Other Ambulatory Visit (INDEPENDENT_AMBULATORY_CARE_PROVIDER_SITE_OTHER): Payer: Self-pay | Admitting: Surgery

## 2010-10-14 DIAGNOSIS — K660 Peritoneal adhesions (postprocedural) (postinfection): Secondary | ICD-10-CM

## 2010-10-14 DIAGNOSIS — Z433 Encounter for attention to colostomy: Principal | ICD-10-CM

## 2010-10-14 DIAGNOSIS — I5189 Other ill-defined heart diseases: Secondary | ICD-10-CM | POA: Diagnosis present

## 2010-10-14 DIAGNOSIS — I2589 Other forms of chronic ischemic heart disease: Secondary | ICD-10-CM | POA: Diagnosis present

## 2010-10-14 DIAGNOSIS — E785 Hyperlipidemia, unspecified: Secondary | ICD-10-CM | POA: Diagnosis present

## 2010-10-14 DIAGNOSIS — I509 Heart failure, unspecified: Secondary | ICD-10-CM

## 2010-10-14 DIAGNOSIS — I2510 Atherosclerotic heart disease of native coronary artery without angina pectoris: Secondary | ICD-10-CM | POA: Diagnosis present

## 2010-10-14 DIAGNOSIS — Z79899 Other long term (current) drug therapy: Secondary | ICD-10-CM

## 2010-10-14 DIAGNOSIS — Z8673 Personal history of transient ischemic attack (TIA), and cerebral infarction without residual deficits: Secondary | ICD-10-CM

## 2010-10-14 DIAGNOSIS — E039 Hypothyroidism, unspecified: Secondary | ICD-10-CM | POA: Diagnosis present

## 2010-10-14 DIAGNOSIS — Z95810 Presence of automatic (implantable) cardiac defibrillator: Secondary | ICD-10-CM

## 2010-10-14 DIAGNOSIS — Z7982 Long term (current) use of aspirin: Secondary | ICD-10-CM

## 2010-10-14 DIAGNOSIS — I252 Old myocardial infarction: Secondary | ICD-10-CM

## 2010-10-14 DIAGNOSIS — Z01812 Encounter for preprocedural laboratory examination: Secondary | ICD-10-CM

## 2010-10-14 DIAGNOSIS — I5022 Chronic systolic (congestive) heart failure: Secondary | ICD-10-CM | POA: Diagnosis present

## 2010-10-14 DIAGNOSIS — I10 Essential (primary) hypertension: Secondary | ICD-10-CM | POA: Diagnosis present

## 2010-10-14 DIAGNOSIS — Z7901 Long term (current) use of anticoagulants: Secondary | ICD-10-CM

## 2010-10-14 DIAGNOSIS — K625 Hemorrhage of anus and rectum: Secondary | ICD-10-CM | POA: Diagnosis not present

## 2010-10-14 DIAGNOSIS — Z981 Arthrodesis status: Secondary | ICD-10-CM

## 2010-10-14 LAB — APTT: aPTT: 27 seconds (ref 24–37)

## 2010-10-14 LAB — GLUCOSE, CAPILLARY

## 2010-10-14 LAB — PROTIME-INR: INR: 1.18 (ref 0.00–1.49)

## 2010-10-15 DIAGNOSIS — I5021 Acute systolic (congestive) heart failure: Secondary | ICD-10-CM

## 2010-10-15 LAB — CBC
Platelets: 127 10*3/uL — ABNORMAL LOW (ref 150–400)
RDW: 14 % (ref 11.5–15.5)
WBC: 9.6 10*3/uL (ref 4.0–10.5)

## 2010-10-15 LAB — BASIC METABOLIC PANEL
Chloride: 97 mEq/L (ref 96–112)
GFR calc Af Amer: >60 mL/min (ref >60)
Potassium: 4.1 mEq/L (ref 3.5–5.1)
Sodium: 132 mEq/L — ABNORMAL LOW (ref 135–145)

## 2010-10-15 LAB — DIFFERENTIAL
Basophils Absolute: 0 10*3/uL (ref 0.0–0.1)
Eosinophils Absolute: 0 10*3/uL (ref 0.0–0.7)
Eosinophils Relative: 0 % (ref 0–5)

## 2010-10-16 LAB — CBC
HCT: 30.8 % — ABNORMAL LOW (ref 36.0–46.0)
MCV: 104.1 fL — ABNORMAL HIGH (ref 78.0–100.0)
Platelets: 129 10*3/uL — ABNORMAL LOW (ref 150–400)
RBC: 2.96 MIL/uL — ABNORMAL LOW (ref 3.87–5.11)
WBC: 10.2 10*3/uL (ref 4.0–10.5)

## 2010-10-16 LAB — BASIC METABOLIC PANEL
CO2: 25 mEq/L (ref 19–32)
Chloride: 101 mEq/L (ref 96–112)
Creatinine, Ser: 1.15 mg/dL — ABNORMAL HIGH (ref 0.50–1.10)

## 2010-10-16 LAB — DIFFERENTIAL
Eosinophils Absolute: 0 10*3/uL (ref 0.0–0.7)
Lymphocytes Relative: 16 % (ref 12–46)
Lymphs Abs: 1.6 10*3/uL (ref 0.7–4.0)
Neutrophils Relative %: 77 % (ref 43–77)

## 2010-10-17 LAB — BASIC METABOLIC PANEL
BUN: 16 mg/dL (ref 6–23)
CO2: 23 mEq/L (ref 19–32)
Chloride: 102 mEq/L (ref 96–112)
Creatinine, Ser: 0.85 mg/dL (ref 0.50–1.10)
Glucose, Bld: 111 mg/dL — ABNORMAL HIGH (ref 70–99)

## 2010-10-17 LAB — CBC
HCT: 32 % — ABNORMAL LOW (ref 36.0–46.0)
MCV: 106 fL — ABNORMAL HIGH (ref 78.0–100.0)
RBC: 3.02 MIL/uL — ABNORMAL LOW (ref 3.87–5.11)
RDW: 14.1 % (ref 11.5–15.5)
WBC: 10.5 10*3/uL (ref 4.0–10.5)

## 2010-10-18 LAB — CBC
HCT: 30.7 % — ABNORMAL LOW (ref 36.0–46.0)
Hemoglobin: 10.6 g/dL — ABNORMAL LOW (ref 12.0–15.0)
MCH: 35.9 pg — ABNORMAL HIGH (ref 26.0–34.0)
MCHC: 34.5 g/dL (ref 30.0–36.0)
MCV: 104.1 fL — ABNORMAL HIGH (ref 78.0–100.0)

## 2010-10-18 NOTE — Op Note (Signed)
NAMELURLIE, WIGEN NO.:  [id number]  MEDICAL RECORD NO.:  [id number]  LOCATION:  2102                         FACILITY:  [facility]  PHYSICIAN:  Sandria Bales. Ezzard Standing, M.D.  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  OPERATIONS PERFORMED:  Reversal of end colostomy, enterolysis of adhesions for 30 minutes, and open appendectomy.  SURGEON:  Sandria Bales. Ezzard Standing, MD  FIRST ASSISTANT:  Adolph Pollack, MD  ANESTHESIA:  General endotracheal.  ESTIMATED BLOOD LOSS:  200 mL.  DRAINS:  Telfa wicks in the wound.  INDICATIONS FOR PROCEDURE:  Ms. 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 Ovidio Kin.  There was no evidence of malignancy in the final pathology specimen.  She is now on a reversal of her colostomy.  Her most significant comorbid problem is she has ischemic cardiomyopathy and has a defibrillator in place.  She was followed by Dr. Roger Shelter and she is now being followed by Dr. Rollene Rotunda.  The indications and potential complications of colostomy reversal were explained to the patient.  The potential complications include but are limited to bleeding, infection, leak from the bowel, and something may be associated with her cardiac status.  OPERATIVE NOTE:  The patient underwent a general endotracheal anesthetic supervised by Dr. Kipp Brood.  This was in room 2 at [facility].  She had a left arterial line placed preoperatively, had an NG tube, Foley catheter in place, and she was given Cipro IV preoperatively.  A time-out was held and the  surgical checklist run.  Her abdomen was prepped with ChloraPrep.  Her colostomy had been sewn closed.  Her perineum was prepped with Betadine solution.  She was in the lithotomy position and sterilely draped.    I went through her old midline incision into her abdominal cavity.  She had adhesions to the anterior abdominal wall and small bowel adhesions that took between 30- 45 minutes for enterolysis.  Of note, her appendix was actually stuck down the staple line of her distal sigmoid colon stump.  I was able to identify the sigmoid colon stump which was probably about 5-7 cm above the peritoneal reflection and actually by mobilizing this, I was able to get this up out  of the pelvis.  I turned my attention to her colostomy which was in the left upper quadrant.  This was taken down.  I had already mobilized the splenic flexure in the previous operation and I was able to swing the left transverse colon down towards the pelvis.  I resected about 7-8 cm of the distal colon with the colostomy in place and sewed this to the distal sigmoid colon.  I did spill some stool in the pelvis which I think changes the wound from a class II to class III wound.  This was  irrigated out with 2 L.  I then did a hand-sewn anastomosis between the distal left transverse colon and the distal sigmoid colon stump using interrupted 2-0 silk sutures in a single layer.  I did inverting sutures posteriorly around anteriorly and then Gambee sutures to close the opening.  I was able to get about a thumb breadth through the anastomosis.  Both ends of the bowel looked good without any evidence of ischemia. Bleeding had been controlled with either 2-0 Vicryl ties, 2-0 Vicryl ligatures, and Bovie electrocautery.  At this point, Dr. Abbey Chatters broke scrub, did a sigmoidoscopy where he insufflated the distal colon.  I put a bowel clamp across the bowel proximal to the anastomosis.  The anastomosis was insufflated and  it was placed under tension and this flooded the pelvis.  There was no evidence of air leak or evidence of leak.  At this time, I then took the bowel clamp off.  I did take care of her appendix which had been stuck on the staple line.  I took down the mesentery of the appendix with 2-0 Vicryl ties.  I ligated the base of the appendix with a 2-0 Vicryl suture and then did a pursestring 2-0 silk sutures to invert the stump of the appendix.  It was sent separately as a path specimen.  I irrigated the abdomen with 4 L of saline.  I reinspected the pelvis. There was no bleeding.  The anastomosis looked good.  I reinspected the appendectomy.  This looked good.  I re-ran the small bowel and I thought that I did enterolysis all the way to the ligament of Treitz.  I did have 2 small serosal enterotomies that required single stitches which I closed.  The NG tube was in good position.  The liver was unremarkable. During the dissection, I identified both the left ureter which was retroperitoneal that I did not get into and the right ureter.  At the end of the case, I dilated the rectum.  She did have a little anal tear at the end with my dilatation but she did have a fairly tight rectum.  I think this will help as far as maybe her passing gas or at least initial bowel movements.  The patient tolerated the procedure well and will be transported to the recovery room in good condition.  Because of her cardiac history which was so significant, I will place her in the ICU at least for 24-hour observation and I will speak to [facility] for postoperative consultation.    Sandria Bales. Ezzard Standing, M.D., FACS   DHN/MEDQ  D:  10/14/2010  T:  10/14/2010  Job:  [id number]  cc:   Sanda Linger, MD Willis Modena, MD Rollene Rotunda, MD, Karleen Hampshire, M.D. Clydene Fake, M.D.  Electronically Signed by Ovidio Kin M.D. on 10/18/2010 12:29:00 PM

## 2010-10-19 LAB — CBC
Hemoglobin: 9.2 g/dL — ABNORMAL LOW (ref 12.0–15.0)
MCH: 35.7 pg — ABNORMAL HIGH (ref 26.0–34.0)
MCV: 102.7 fL — ABNORMAL HIGH (ref 78.0–100.0)
RBC: 2.58 MIL/uL — ABNORMAL LOW (ref 3.87–5.11)
WBC: 4.2 10*3/uL (ref 4.0–10.5)

## 2010-10-19 LAB — DIFFERENTIAL
Lymphs Abs: 1 10*3/uL (ref 0.7–4.0)
Monocytes Relative: 13 % — ABNORMAL HIGH (ref 3–12)
Neutro Abs: 2.5 10*3/uL (ref 1.7–7.7)
Neutrophils Relative %: 59 % (ref 43–77)

## 2010-10-20 DIAGNOSIS — I2589 Other forms of chronic ischemic heart disease: Secondary | ICD-10-CM

## 2010-10-20 LAB — PROTIME-INR: Prothrombin Time: 17.6 seconds — ABNORMAL HIGH (ref 11.6–15.2)

## 2010-10-21 LAB — PROTIME-INR
INR: 1.6 — ABNORMAL HIGH (ref 0.00–1.49)
Prothrombin Time: 19.3 seconds — ABNORMAL HIGH (ref 11.6–15.2)

## 2010-10-21 LAB — BASIC METABOLIC PANEL
CO2: 26 mEq/L (ref 19–32)
Calcium: 8.6 mg/dL (ref 8.4–10.5)
Chloride: 102 mEq/L (ref 96–112)
GFR calc Af Amer: >60 mL/min (ref >60)
Sodium: 136 mEq/L (ref 135–145)

## 2010-10-21 NOTE — Discharge Summary (Signed)
NAMEJONIYA, [name] NO.:  [id number]  MEDICAL RECORD NO.:  [id number]  LOCATION:  4730                         FACILITY:  [facility]  PHYSICIAN:  Sandria Bales. Ezzard Standing, M.D.  DATE OF BIRTH:  1950/12/17  DATE OF ADMISSION:  10/14/2010 DATE OF DISCHARGE:  10/21/2010                              DISCHARGE SUMMARY   DISCHARGE DIAGNOSES: 1. 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 2. Coronary artery disease with history of ejection fraction between     20% and 30%. 3. History of left ventricular thrombus. 4. Defibrillator. 5. On chronic Coumadin. 6. History of left ventricular thrombus. 7. History of transient ischemic attacks. 8. History of lumbar disk surgery in November 2011.  OPERATIONS PERFORMED:  The patient underwent a colostomy closure, resection of sigmoid colon, and appendectomy on October 14, 2010.  HISTORY OF ILLNESS:  [name] is a 60 year old African American female, who sees Dr. Sanda Linger, who is her primary medical doctor.  She was seen Dr. Willis Modena from which gastrointestinal standpoint. She was a patient of Dr. Roger Shelter until his retirement and is now seeing Dr. Rollene Rotunda of [facility] for significant coronary artery disease.  She underwent back surgery in November 2011 by Dr. Colon Branch.  She was admitted to [facility] for colonic obstruction in January 2012.  She underwent a sigmoid colon resection on March 25, 2010, because of a diverticular stricture.  There was no evidence of malignancy in her final pathology.  We have now awaited approximately 6 months before reversing this colostomy in which she is for this admission.  PAST MEDICAL HISTORY:  Significant that 1. She has a history of ischemic cardiomyopathy with a 20%-30%     ejection fraction, has had several MIs in the past, has a     defibrillator placed approximately 2002.  She has also had a     diagnosis of left ventricular thrombus  in the past and on chronic     anticoagulation for this. 2. She had a lumbar fusion on November 2011 by Dr. Colon Branch, has     done well from the recovery on this.  HOSPITAL COURSE:  She completed antibiotic and mechanical bowel prep at home, presented to the [facility] operating room on October 14, 2010 where she underwent a colostomy closure, resection of sigmoid colon, and appendectomy.  I asked [facility] to see her in consultation postop for both management of heart disease and her Coumadin.  She was seen by Dr. Rollene Rotunda initially.  Her postoperative course on the first postop day, I did place her in the ICU for couple days.  Her hemoglobin was 10.  Her white blood count is 9600.  She was stable from a hemodynamic standpoint and did given just subcutaneous heparin.  Her platelet count was noted to be 127,000, so she was slightly thrombocytopenic.  She did have some bleeding from her rectum where this is due to her anus bleeding or for the anastomosis was unclear, but this stopped on its own without intervention.  By the fourth postoperative day, she was started on clear liquids.  She was transferred to telemetry 4700 and she  was restarted on a Coumadin.  She is now 7 days postop.  She is afebrile.  Her wound looks good.  Her PT is 19.3 with INR 1.6 and she is ready for discharge.  DISCHARGE INSTRUCTIONS:   Include resuming her home medication, which includes: 1. Coumadin 3 mg daily except on Tuesday and Thursday, and Saturday     she takes 1.5. 2. Coreg 20 mg daily. 3. Lasix 40 mg daily. 4. Fexofenadine 180 mg daily. 5. Lisinopril 2.5 mg daily. 6. Robaxin 500 mg daily. 7. Protonix 40 mg daily. 8. Synthroid 200 mcg daily.  She also be given Vicodin for pain to go home with.  I will see her back in 1 week for staple removal and wound check.  She will be arranged for home health care to come by to assist her with taking her coags.  DISCHARGE CONDITION:   Good.   Sandria Bales. Ezzard Standing, M.D., FACS   DHN/MEDQ  D:  10/21/2010  T:  10/21/2010  Job:  [id number]  cc:   Sanda Linger, MD Willis Modena, MD Rollene Rotunda, MD, [facility] Clydene Fake, M.D.  Electronically Signed by Ovidio Kin M.D. on 10/21/2010 10:25:41 AM

## 2010-10-22 ENCOUNTER — Encounter: Payer: Self-pay | Admitting: Cardiology

## 2010-10-23 ENCOUNTER — Inpatient Hospital Stay (HOSPITAL_COMMUNITY)
Admission: EM | Admit: 2010-10-23 | Discharge: 2010-10-25 | DRG: 309 | Disposition: A | Discharge status: Home Health | Payer: Medicare Other | Attending: Internal Medicine | Admitting: Internal Medicine

## 2010-10-23 ENCOUNTER — Emergency Department (HOSPITAL_COMMUNITY): Payer: Medicare Other

## 2010-10-23 DIAGNOSIS — I4891 Unspecified atrial fibrillation: Principal | ICD-10-CM | POA: Diagnosis present

## 2010-10-23 DIAGNOSIS — Z7901 Long term (current) use of anticoagulants: Secondary | ICD-10-CM

## 2010-10-23 DIAGNOSIS — E785 Hyperlipidemia, unspecified: Secondary | ICD-10-CM | POA: Diagnosis present

## 2010-10-23 DIAGNOSIS — E876 Hypokalemia: Secondary | ICD-10-CM | POA: Diagnosis present

## 2010-10-23 DIAGNOSIS — I472 Ventricular tachycardia, unspecified: Secondary | ICD-10-CM | POA: Diagnosis present

## 2010-10-23 DIAGNOSIS — E039 Hypothyroidism, unspecified: Secondary | ICD-10-CM | POA: Diagnosis present

## 2010-10-23 DIAGNOSIS — Z9861 Coronary angioplasty status: Secondary | ICD-10-CM

## 2010-10-23 DIAGNOSIS — I129 Hypertensive chronic kidney disease with stage 1 through stage 4 chronic kidney disease, or unspecified chronic kidney disease: Secondary | ICD-10-CM | POA: Diagnosis present

## 2010-10-23 DIAGNOSIS — N183 Chronic kidney disease, stage 3 unspecified: Secondary | ICD-10-CM | POA: Diagnosis present

## 2010-10-23 DIAGNOSIS — Z8673 Personal history of transient ischemic attack (TIA), and cerebral infarction without residual deficits: Secondary | ICD-10-CM

## 2010-10-23 DIAGNOSIS — I2510 Atherosclerotic heart disease of native coronary artery without angina pectoris: Secondary | ICD-10-CM | POA: Diagnosis present

## 2010-10-23 DIAGNOSIS — Z79899 Other long term (current) drug therapy: Secondary | ICD-10-CM

## 2010-10-23 DIAGNOSIS — I5022 Chronic systolic (congestive) heart failure: Secondary | ICD-10-CM | POA: Diagnosis present

## 2010-10-23 DIAGNOSIS — I359 Nonrheumatic aortic valve disorder, unspecified: Secondary | ICD-10-CM | POA: Diagnosis present

## 2010-10-23 DIAGNOSIS — I4729 Other ventricular tachycardia: Secondary | ICD-10-CM | POA: Diagnosis present

## 2010-10-23 DIAGNOSIS — I5189 Other ill-defined heart diseases: Secondary | ICD-10-CM | POA: Diagnosis present

## 2010-10-23 DIAGNOSIS — Z95810 Presence of automatic (implantable) cardiac defibrillator: Secondary | ICD-10-CM

## 2010-10-23 DIAGNOSIS — Z7982 Long term (current) use of aspirin: Secondary | ICD-10-CM

## 2010-10-23 DIAGNOSIS — I2589 Other forms of chronic ischemic heart disease: Secondary | ICD-10-CM | POA: Diagnosis present

## 2010-10-23 DIAGNOSIS — M1990 Unspecified osteoarthritis, unspecified site: Secondary | ICD-10-CM | POA: Diagnosis present

## 2010-10-23 DIAGNOSIS — I509 Heart failure, unspecified: Secondary | ICD-10-CM | POA: Diagnosis present

## 2010-10-23 DIAGNOSIS — I252 Old myocardial infarction: Secondary | ICD-10-CM

## 2010-10-23 LAB — TROPONIN I: Troponin I: <0.3 ng/mL (ref <0.30)

## 2010-10-23 LAB — CK TOTAL AND CKMB (NOT AT ARMC)
CK, MB: 2.7 ng/mL (ref 0.3–4.0)
Relative Index: INVALID (ref 0.0–2.5)
Total CK: 76 U/L (ref 7–177)
Total CK: 67 U/L (ref 7–177)

## 2010-10-23 LAB — CBC
MCV: 103.2 fL — ABNORMAL HIGH (ref 78.0–100.0)
Platelets: 236 10*3/uL (ref 150–400)
RBC: 2.81 MIL/uL — ABNORMAL LOW (ref 3.87–5.11)
WBC: 6.3 10*3/uL (ref 4.0–10.5)

## 2010-10-23 LAB — DIFFERENTIAL
Basophils Absolute: 0 10*3/uL (ref 0.0–0.1)
Basophils Relative: 0 % (ref 0–1)
Lymphocytes Relative: 24 % (ref 12–46)
Monocytes Absolute: 0.9 10*3/uL (ref 0.1–1.0)
Neutro Abs: 3.7 10*3/uL (ref 1.7–7.7)
Neutrophils Relative %: 60 % (ref 43–77)

## 2010-10-23 LAB — POCT I-STAT, CHEM 8
Creatinine, Ser: 1.2 mg/dL — ABNORMAL HIGH (ref 0.50–1.10)
HCT: 33 % — ABNORMAL LOW (ref 36.0–46.0)
Hemoglobin: 11.2 g/dL — ABNORMAL LOW (ref 12.0–15.0)
Potassium: 3.2 mEq/L — ABNORMAL LOW (ref 3.5–5.1)
Sodium: 143 mEq/L (ref 135–145)
TCO2: 26 mmol/L (ref 0–100)

## 2010-10-23 LAB — COMPREHENSIVE METABOLIC PANEL
ALT: 10 U/L (ref 0–35)
AST: 20 U/L (ref 0–37)
Alkaline Phosphatase: 55 U/L (ref 39–117)
CO2: 28 mEq/L (ref 19–32)
GFR calc Af Amer: >60 mL/min (ref >60)
GFR calc non Af Amer: >60 mL/min (ref >60)
Glucose, Bld: 122 mg/dL — ABNORMAL HIGH (ref 70–99)
Potassium: 3.8 mEq/L (ref 3.5–5.1)
Sodium: 140 mEq/L (ref 135–145)

## 2010-10-23 LAB — BASIC METABOLIC PANEL
CO2: 27 mEq/L (ref 19–32)
Chloride: 104 mEq/L (ref 96–112)
Potassium: 3 mEq/L — ABNORMAL LOW (ref 3.5–5.1)
Sodium: 143 mEq/L (ref 135–145)

## 2010-10-24 DIAGNOSIS — I472 Ventricular tachycardia: Secondary | ICD-10-CM

## 2010-10-24 LAB — LIPID PANEL
Cholesterol: 155 mg/dL (ref 0–200)
HDL: 69 mg/dL (ref >39)
Total CHOL/HDL Ratio: 2.2 RATIO
VLDL: 15 mg/dL (ref 0–40)

## 2010-10-25 DIAGNOSIS — I4891 Unspecified atrial fibrillation: Secondary | ICD-10-CM

## 2010-10-25 LAB — BASIC METABOLIC PANEL
BUN: 8 mg/dL (ref 6–23)
Chloride: 98 mEq/L (ref 96–112)
Creatinine, Ser: 0.95 mg/dL (ref 0.50–1.10)
GFR calc Af Amer: >60 mL/min (ref >60)
Glucose, Bld: 104 mg/dL — ABNORMAL HIGH (ref 70–99)

## 2010-10-25 LAB — PROTIME-INR: Prothrombin Time: 23.2 seconds — ABNORMAL HIGH (ref 11.6–15.2)

## 2010-10-28 ENCOUNTER — Telehealth: Payer: Self-pay | Admitting: *Deleted

## 2010-10-28 NOTE — Telephone Encounter (Signed)
[facility] RN is req verbal OK from MD to resume PT/INR checks next week. Pt was just d/c'd from hospital. OK?

## 2010-10-28 NOTE — H&P (Signed)
[name], [name] NO.:  [id number]  MEDICAL RECORD NO.:  [id number]  LOCATION:  [facility]                         FACILITY:  [facility]  PHYSICIAN:  Armanda Magic, M.D.     DATE OF BIRTH:  1950/07/29  DATE OF ADMISSION:  10/23/2010 DATE OF DISCHARGE:                             HISTORY & PHYSICAL   PRIMARY CARDIOLOGIST:  Rollene Rotunda, MD, [facility].  PRIMARY CARE PHYSICIAN:  Sanda Linger, MD.  SURGEON:  Sandria Bales. Ezzard Standing, MD.  HISTORY OF PRESENT ILLNESS:  This is a 60 year old African American female, formally a patient of Dr. Deborah Chalk, is now patient of Dr. Rollene Rotunda, with a known history of ischemic cardiomyopathy with an EF of 30%, also CAD.  She has a history of an LV thrombus and is on Coumadin. 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 Ezzard Standing.  She was admitted at that time from July 26,2012 to October 21, 2010.  The patient also has a history of a single- chamber [employer] ICD pacemaker which was placed secondary to ventricular tachycardia in the setting of a low ejection fraction.  The patient recently had this interrogated and it was functioning normally.  The patient was in her usual state of health, has been feeling very well.  She got up this morning around 8 o'clock and walked around the room, watching television, and all of the sudden, she had a defibrillator ICD discharge.  The patient states that it knocked her over and she landed on her couch, she got up and unlocked the front door, called EMS, on her way back to lying down, ICD discharged again. EMS arrived, placed her on oxygen, and brought her to the emergency room.  The patient was seen by ER and had her pacemaker interrogated by [employer] representative.  It states that it was questionable whether she had arrhythmias as a result of V-tach, V-fib, or AFib with RVR and aberrancy.  Episode July  26 showed rates above 240 beats per minute, two times for 10 or more beats.  The patient has been seen and evaluated by Dr. Armanda Magic.  The pacemaker interrogation was also evaluated by Dr. Mayford Knife as well.  Currently, the patient is without complaint and has had no further ICD discharges.  The patient has been eating and drinking well and has had no complaints.  She has not taken her medications this morning secondary to these episodes which started around 8 a.m..  The patient is noted to have mildly low hypokalemia with potassium of 3.2. The patient currently is stable.  REVIEW OF SYSTEMS:  Positive for ICD shocks x2, negative for chest pain, shortness of breath, dizziness, or near syncope. All other symptoms are reviewed and found to be negative unless listed above.  PAST MEDICAL HISTORY:  CAD, systolic CHF, hyperlipidemia, hypertension, history of LV thrombus, on Coumadin, single chamber ICD placed approximately 2002.  PAST SURGICAL HISTORY: 1. Colostomy reversal on October 14, 2010. 2. ICD single-chamber pacemaker in 2002 approximately.  SOCIAL HISTORY:  She lives in [place].  She is a former smoker.  She is a widowed.  Negative for EtOH or drug use.  CURRENT MEDICATIONS PRIOR TO ADMISSION: 1. Amiodarone 200 mg daily. 2. Aspirin 81 mg daily. 3. Carvedilol CR 20 mg daily. 4. Zetia 10 mg daily. 5. Lasix 40 mg daily. 6. Levothyroxine 200 mcg daily. 7. Lisinopril 2.5 mg daily. 8. Robaxin 500 mg daily. 9. Protonix 40 mg daily.  ALLERGIES:  PENICILLIN and STATINS.  CURRENT LABS:  Sodium 143, potassium 3.2, chloride 103, CO2 26, BUN 6, creatinine 1.2, glucose 117.  Hemoglobin 10.1, hematocrit 29.0, white blood cells 6.3, platelets 236.  CK 76, MB 2.7, troponin less than 0.030.    EKG revealing sinus rhythm, rate of 75 beats per minute.  Chest x-ray stable appearance of left subclavian ICD, no acute abnormalities.  PHYSICAL EXAMINATION:  GENERAL:  She is awake, alert,  and oriented, in no acute distress. HEENT:  Head is normocephalic and atraumatic.  Eyes:  PERRLA. NECK:  Supple without JVD or carotid bruits appreciated. CARDIOVASCULAR:  Regular rhythm and rate without murmurs, rubs, or gallops. LUNGS:  Clear to auscultation. ABDOMEN:  There are staples horizontally in the mid abdomen with staples in the upper left abdomen.  There is a fair amount of ecchymosis noted and some tenderness on palpation. EXTREMITIES:  Without clubbing, cyanosis, or edema. NEURO:  Cranial nerves II-XII are grossly intact.  IMPRESSION:  ICD discharges in the setting of supraventricular tachycardia versus atrial fibrillation with rapid ventricular response aberration.  PLAN: 1. The patient has been seen and examined by Dr. Armanda Magic.  ICD     rhythm was reviewed with [employer] representative.  It appears that     the event was AFib with RVR rates anywhere from 116 beats per     minute to 230 beats per minute,  appears to be more AFib, but two     EGMs were noted, which showed different morphology in ventricular     zone.  Question V-tach, but appears irregular, which would favor     AFib with RVR and aberration.  The patient's potassium is 3.2.  We     will admit to ICU, replete potassium, start amiodarone drip 150 mg     IV and then 1 mg/kg for 6 hours and then decrease to 0.5 mg/kg.     We will send off an amiodarone level.  Dr. Mayford Knife is also discussed with      Dr. Graciela Husbands this case and he has also suggested that we add Lopressor      25 mg b.i.d.  We will change first V zone detection to 180 beats per      minute and continue current therapy and remove VT zone #2, keep VF zone      at 240 beats per minute and will turn off sustained rate detection and      turn off enhanced detection for irregular rate.  The patient will be      followed by Dr.Klein throughout hospitalization within an EP consultation to be     completed. 3. History of ventricular thrombus.  We will  continue Coumadin and INR     will be monitored by pharmacy. 4. CAD with ischemic cardiomyopathy with an EF of 20%-30%.     a.     Most recent cardiac echocardiogram confirm EF of 25%-30%      with regional wall motion abnormalities with akinesis of the mid      and distal anterior apical myocardium.  She will continue on Lasix  40 mg daily with potassium replacement.   The patient has had this explained to her and she will be admitted for further evaluation and continued management of irregular heart rhythm and EP evaluation as well.  Settings are discussed above with the [employer] representative which have changed the  parameters as discussed.  We will make further recommendations throughout hospital course depending upon the patient's response to treatment and further evaluation by Dr. Graciela Husbands.     Bettey Mare. Lyman Bishop, NP   ______________________________ Armanda Magic, M.D.    KML/MEDQ  D:  10/23/2010  T:  10/23/2010  Job:  [id number]  cc:   Sanda Linger, MD  Electronically Signed by Joni Reining NP on 10/25/2010 08:53:47 AM Electronically Signed by Armanda Magic M.D. on 10/28/2010 07:37:57 PM

## 2010-10-29 ENCOUNTER — Ambulatory Visit (INDEPENDENT_AMBULATORY_CARE_PROVIDER_SITE_OTHER): Payer: Medicare Other | Admitting: Surgery

## 2010-10-29 VITALS — Wt 145.5 lb

## 2010-10-29 DIAGNOSIS — Z8719 Personal history of other diseases of the digestive system: Secondary | ICD-10-CM

## 2010-10-29 NOTE — Progress Notes (Signed)
[name] is a 60 y.o. AA female who is a patient of Sanda Linger, MD, MD and comes to me today for post op colostomy closure.  She was hospitalized from 10/14/2010 to 10/21/2010.  Her DISCHARGE DIAGNOSES were:  1. 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  2. Coronary artery disease with history of ejection fraction between 20% and 30%.  3. History of left ventricular thrombus.  4. Defibrillator.  5. On chronic Coumadin.  6. History of left ventricular thrombus.  7. History of transient ischemic attacks.  8. History of lumbar disk surgery in November 2011.   OPERATIONS PERFORMED: The patient underwent a colostomy closure, resection of sigmoid colon, and appendectomy on October 14, 2010.   Unfortunately, this past Saturday, 23 October 2010, the patient defibrillator went off. She apparently had hypokalemia as a cause for a cardiac dysrhythmia. She was hospitalized at [facility] for 3 days. I was not aware of her hospitalization. (I was even on call this past weekend.)  She did well she went home. She is eating better. Gaining strength. Bowel movements are working fine.  Past Medical History  Diagnosis Date  . Spinal stenosis   . Numbness of foot     left foot  . Diverticulitis     Status post partial colectomy 1/12  . Colitis, ischemic   . DJD (degenerative joint disease)     History of multiple surgeries to the back, shoulder and knee  . Ischemic cardiomyopathy     Echo 06/16/10: EF 25-30%, anteroseptal and apical hypokinesis, moderate AI, mild MR  . Chronic systolic heart failure   . CAD (coronary artery disease)     a.  Ant MI 8/03 with stenting of the LAD;  b. staged PCI with Cypher DES to OM1 8/03;  c. s/p Cypher DES to LAD 7/04;  d.  multiple cardiac caths in past (chronically abnl ECG);    e. cardiac catheterization 3/12: LAD stent patent, distal LAD 40-50%, small ostial D1 80%, ostial D2 60%, OM-1 stent patent (20-30%), proximal RCA 50%, proximal to mid RCA 40-50%  . LV  (left ventricular) mural thrombus     Chronic Coumadin therapy  . Stroke     History of TIA and possible CVA; hospitalized 2004  . CKD (chronic kidney disease), stage III     creatinine:  1.3 in 4/12;    . Tobacco abuse     history  . GERD (gastroesophageal reflux disease)   . Hypertension   . Lower GI bleed     August 2011  . SVT (supraventricular tachycardia)     ? h/o AFib;  patient on long term amiodarone therapy  . Hypothyroidism   . Aortic insufficiency   . Pseudoaneurysm     History of, right forearm  . ICD (implantable cardiac defibrillator), single, in situ     [employer];  2008  . CHF (congestive heart failure)   . Hyperlipidemia   . OA (osteoarthritis)    SOCIAL and FAMILY HISTORY:  PHYSICAL EXAM: Wt 145 lb 8 oz (65.998 kg)  Abdomen: Her incisions are well-healed. She has good bowel sounds. She has some soreness around her colostomy incision in her left upper quadrant. I removed all the staples from her wound.  ASSESSMENT and PLAN: 1. 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   Has done well from surgery.  I made this her last visit.  She can call me for any further problems.  2. Coronary artery disease with history of ejection fraction  between 20% and 30%.  3. History of left ventricular thrombus.  4. Defibrillator. Defibrillator went off this past weekend requiring three days at [facility]. 5. On chronic Coumadin.  6. History of transient ischemic attacks.  7. History of lumbar disk surgery in November 2011.

## 2010-10-29 NOTE — Telephone Encounter (Signed)
yes

## 2010-10-29 NOTE — Telephone Encounter (Signed)
Clydie Braun, [facility] advised of verbal authorization.

## 2010-11-02 ENCOUNTER — Telehealth: Payer: Self-pay | Admitting: Cardiology

## 2010-11-02 NOTE — Telephone Encounter (Signed)
She can hold her Lasix and K+ and Lisinopril. Hold coreg tonight. Try to restart Coreg 12.5 mg 1/2 tab BID tomorrow. Get BMET drawn. Weigh daily and call if:  Weight up 3 lbs in one day, increased swelling or increased dyspnea.  Have home health check BP again in next 2 days and get orthostats again and call us with readings. Tereso Newcomer, PA-C

## 2010-11-02 NOTE — Telephone Encounter (Signed)
Pt is having low b/p lying down 96/60 when pt is sitting up it is 80/54 HR is 60 and pt has had fatigue today if you have orders or medication changes pls call pt

## 2010-11-02 NOTE — Telephone Encounter (Signed)
Mark with [facility] calling re his reading on pt today, 1230pm 92/58 pulse 62, resp 20, temp 87.5, normal urine, denies chest pain, has not taken any meds and won't until she hears from us-pls advise

## 2010-11-02 NOTE — Discharge Summary (Signed)
[name], [name] NO.:  [id number]  MEDICAL RECORD NO.:  [id number]  LOCATION:  3701                         FACILITY:  [facility]  PHYSICIAN:  Becky Frieze. Jens Som, MD, FACCDATE OF BIRTH:  January 08, 1951  DATE OF ADMISSION:  10/23/2010 DATE OF DISCHARGE:  10/25/2010                              DISCHARGE SUMMARY   PRIMARY CARDIOLOGIST:  Becky Rotunda, MD, [facility]  PRIMARY CARE PROVIDER:  Sanda Linger, MD  GENERAL SURGEON:  Becky Bales. Ezzard Standing, MD  DISCHARGE DIAGNOSIS:  Presumed atrial fibrillation with rapid ventricular response.  SECONDARY DIAGNOSES: 1. Implantable cardioverter-defibrillator discharge x2. 2. History of diverticular stricture, status post end colostomy on     October 14, 2010. 3. Coronary artery disease, status post anterior myocardial infarction     in 2003 with stenting of the left anterior descending. 4. Ischemic cardiomyopathy, ejection fraction 25-30% in March 2012. 5. Chronic systolic congestive heart failure. 6. History of left ventricular thrombus, on chronic Coumadin therapy. 7. Hypertension. 8. Ischemic colitis. 9. History of lower gastrointestinal bleed in August 2011 with anemia. 10.History of transient ischemic attack and possible cerebrovascular     accident hospitalized in 2004. 11.Supraventricular tachycardia. 12.Hypothyroidism. 13.Moderate aortic insufficiency. 14.Stage III chronic kidney disease. 15.Osteoarthritis. 16.Lumbar disk disease. 17.History of right groin pseudoaneurysm, treated with compression. 18.Status post sigmoid colectomy and colostomy in January 2012. 19.Status post multiple back surgeries. 20.Status post right rotator cuff repair.  ALLERGIES:  PENICILLIN, STATINS, CLINDAMYCIN, AND PREVACID.  PROCEDURES:  None.  HISTORY OF PRESENT ILLNESS:  A 60 year old female with the above complex problem list who is recently status post colostomy on October 14, 2010, and was discharged from the hospital on October 21, 2010.   The patient was in her usual state of health until the morning of admission when she was watching TV and noted that her ICD shocked her, it knocked her over, and she landed on the couch.  She got up and called the EMS.  On her way back to lying down, ICD discharged again.  The patient was stable when EMS arrived and had no further discharges.  She was taken to the [facility] ED where her ICD was interrogated and this showed 231 joules shock. The patient has only a ventricular lead, it was not clear whether the patient was appropriately shocked for VT or VF or if the patient had AFib with RVR.  There was irregularity of ventricular rhythm was noted. The patient was placed on amiodarone infusion and her potassium, which was low at 3.2 was supplemented.  She was admitted for further evaluation.  HOSPITAL COURSE:  After discussion with Electrophysiology, the following changes were made into the patient's device:  3 zones to 2 zones (VT 180 beats per minute; SRD off, ATP x2, ATP x2, 31 joules.  VF 240 beats per minute; 31 joules.  SVT inhibition was on).  The patient had no further ICD discharges and no evidence of arrhythmias during her hospital stay. From a heart failure standpoint, she has remained euvolemic.  Her INR has been therapeutic, and we have switched her from IV to oral amiodarone.  She will be discharged home today in good condition.  She will  continue on amiodarone 40 mg daily x2 weeks and then this will be reduced to 200 mg daily.  We have requested a basic metabolic panel and INR to be checked in 1 week through home health.  DISCHARGE LABORATORY DATA:  Hemoglobin 11.2, hematocrit 33.0, WBC 6.3, platelets 236.  INR 2.02.  Sodium 135, potassium 4.1, chloride 98, CO2 of 30, BUN 8, creatinine 0.95, glucose 104, total bilirubin 0.6, alkaline phosphatase 55, AST 20, ALT 10, total protein 6.4, albumin 3.5, calcium 8.9, magnesium 2.0.  CK 67, MB 2.8, troponin I less than  0.30, total cholesterol 155, triglycerides 73, HDL 69, LDL 71.  TSH 6.4.  MRSA screen was negative.  DISPOSITION:  The patient will be discharged home today in good condition.  FOLLOWUP PLANS AND APPOINTMENTS:  We will arrange followup with Dr. Graciela [name] in approximately 2 weeks.  She will follow up with Dr. Antoine [name] as previously scheduled as well as with Dr. Yetta [name] and Dr. Ezzard [name].  We have asked for blood chemistry and PT/INR in approximately 1 week through home health.  DISCHARGE MEDICATIONS: 1. Amiodarone 400 mg daily x2 weeks, then 200 mg daily. 2. Carvedilol 12.5 mg b.i.d. 3. Lisinopril 2.5 mg daily. 4. Potassium chloride 20 mEq 2 tablets daily. 5. Aspirin 81 mg daily. 6. Coumadin 3 mg 1-1/2 tablets on Thursday and Saturday; 1 tablet the     other days. 7. Flonase nasal spray 1 spray to each nostril nightly. 8. Foltx 1 tablet daily. 9. Furosemide 40 mg daily. 10.Fexofenadine 180 mg daily. 11.Fish oil 1000 mg daily. 12.Hydrocodone/APAP 5/325 mg 1-2 tablets q.4 h. P.r.n. 13.Methocarbamol 500 mg q.6 h. P.r.n. 14.Nitroglycerin p.r.n. 15.Protonix 40 mg daily. 16.Synthroid 200 mcg daily. 17.Zetia 10 mg nightly.  OUTSTANDING LABORATORY DATA AND STUDIES:  Follow up PT/INR and BMET on November 01, 2010, through home health.  DURATION DISCHARGE ENCOUNTER:  60 minutes including physician time.     [name], ANP   ______________________________ Becky Frieze. Jens Som, MD, [facility]    CB/MEDQ  D:  10/25/2010  T:  10/26/2010  Job:  [id number]  cc:   Becky Linger, MD [name], M.D.  Electronically Signed by [name] ANP on 10/29/2010 03:35:23 PM Electronically Signed by Becky Millers MD [facility] on 11/02/2010 04:24:16 PM

## 2010-11-02 NOTE — Telephone Encounter (Signed)
Spoke with pt who states she is feeling better.  She reports only feeling fatigued and lightheaded when BP was low.  Reviewed instructions with pt who stated understanding.  Will contact home health to review orders with them tomorrow.  (I attempted to call however there phone was off)

## 2010-11-03 ENCOUNTER — Telehealth: Payer: Self-pay | Admitting: Cardiology

## 2010-11-03 NOTE — Telephone Encounter (Signed)
B/p was 98/58 with out meds and pls call pt if you want make any changes

## 2010-11-03 NOTE — Telephone Encounter (Signed)
[name], RN  [facility] aware of orders per Scherrie Bateman, LPN

## 2010-11-03 NOTE — Telephone Encounter (Signed)
SPOKE WITH ROBIN   B/P HAS BEEN RUNNING LOW YESTERDAY WAS 84/48  PT WAS LETHARGIC  THEN TODAY FEELS GOOD PER PT  HAS HELD ALL MEDS  SPOKE WITH DR HOCHREIN'S  NURSE  (PAM)  STATED   MEDS WERE CHANGED YESTERDAY SEE OTHER PHONE NOTE  PT WAS INSTRUCTED  AND  SEEMED AWARE OF NEW  TREATMENT PLAN CALLED  ROBIN  ONCE AGAIN WITH [facility]  AND INFORMED HER OF NEW TX PLAN STATED JUST LEFT PT'S HOME IS  DOWN THE STREET WILL  RETURN AND REVIEW  NEW  INSTRUCTIONS WITH PT AGAIN./CY

## 2010-11-03 NOTE — Consult Note (Signed)
[name], [name] NO.:  [id number]  MEDICAL RECORD NO.:  [id number]  LOCATION:  2102                         FACILITY:  [facility]  PHYSICIAN:  Jesse Sans. Jordayn Mink, MD, FACCDATE OF BIRTH:  Jan 09, 1951  DATE OF CONSULTATION: DATE OF DISCHARGE:                                CONSULTATION   We were asked by Dr. Ovidio Kin to consult on [name], delightful yet complex cardiovascular patient for postop monitoring and care.  HISTORY OF PRESENT ILLNESS:  [name] is a 60 year old black female with a history of coronary artery disease, chronic systolic heart failure with an ejection fraction of about 30%, hyperlipidemia, hypertension, history of LV thrombus on chronic Coumadin, and history of a single-chambered ICD as well as a history of stroke.  She is followed by Dr. Rollene Rotunda of our [facility].  Her primary care physician is Dr. Sanda Linger.  She was cleared for surgery by evaluation with Tereso Newcomer, [facility], on October 07, 2010.  I have printed that note for comprehensive assessment from our standpoint.  She underwent a surgery today for reversal of colostomy that was done in January for severe diverticular disease and stricture.  She was operated on by Dr. Ovidio Kin.  She has done remarkably well since surgery.  She remains in a PACU.  Her ICD has been interrogated and shows normal function.  PAST MEDICAL HISTORY:  Outlined in the chart.  Other than the problem list above, she is on the following medications as an outpatient. 1. Amiodarone 200 mg per day. 2. Aspirin 81 mg a day. 3. Carvedilol chronic release 20 mg per day. 4. Flexeril 5 mg as needed. 5. Diclofenac and misoprostol 75 mg per day. 6. Zetia 10 mg per day. 7. Foltx 1 p.o. daily. 8. Fish oil 1000 mg p.o. daily. 9. Flonase 2 sprays by nasal root as needed. 10.Lasix 40 mg per day. 11.Levothyroxine 200 mcg per day. 12.Lisinopril 2.5 mg 2 times a day. 13.Robaxin as  needed. 14.Nitroglycerin p.r.n. 15.Percocet p.r.n. 16.Protonix 40 mg p.o. daily. 17.Potassium 20 mEq p.o. daily. 18.Coumadin as directed.  ALLERGIES:  She is allergic to PENICILLIN and STATINS.  SOCIAL HISTORY:  She is an ex-smoker.  She is widowed.  She lives by herself.  FAMILY HISTORY:  Positive for CAD.  REVIEW OF SYSTEMS:  She is currently feeling well, lying flat and having no chest pain, shortness of breath.  She denies any edema prior to admission.  PHYSICAL EXAMINATION:  GENERAL:  She is in no acute distress.  She is resting comfortably.  Her exam otherwise is unremarkable.  She is overweight. VITAL SIGNS:  She has a blood pressure of 120/80.  Pulse is 64 and regular, sats 98% on 2 L.  She is afebrile.  Respirations are 18 unlabored. HEART:  No gallop, soft systolic murmur. LUNGS:  Clear to auscultation and percussion. ABDOMEN:  Soft.  No bowel sounds. EXTREMITIES:  No edema.  Pulses were present but reduced. SKIN:  Warm and dry.  Laboratory data reviewed.  ASSESSMENT:  [name] is a pleasant 60 year old black female with stable coronary artery disease, chronic systolic heart failure which is stable, and a  history of nonsustained ventricular tachycardia with a low ejection fraction with an implantable cardioverter-defibrillator which has been interrogated postop which is normal function.  She has been off Coumadin prior to surgery with a Lovenox bridge.  She will need to start back on Coumadin when Dr. Ezzard Standing clears.  I will start IV metoprolol 5 mg q.4 h. running for perioperative beta- blocker protection.  She is being transferred to 2100 where she will be monitored closely with I and Os, telemetry as well as weights.  Continue O2 support for now.  She does not need her amiodarone or her lisinopril until taking p.o.'s. She will need probably some Lasix and potassium supplementation depending on her I's and O's postop.  I have not written for  these today.  Thank you very much for consultation.  We will follow with you.     [name] C. Daleen Squibb, MD, [facility]     TCW/MEDQ  D:  10/14/2010  T:  10/15/2010  Job:  [id number]  Electronically Signed by Valera Castle MD [facility] on 11/03/2010 10:38:23 AM

## 2010-11-04 ENCOUNTER — Telehealth: Payer: Self-pay | Admitting: *Deleted

## 2010-11-04 ENCOUNTER — Ambulatory Visit (INDEPENDENT_AMBULATORY_CARE_PROVIDER_SITE_OTHER): Payer: Self-pay | Admitting: Cardiology

## 2010-11-04 DIAGNOSIS — R0989 Other specified symptoms and signs involving the circulatory and respiratory systems: Secondary | ICD-10-CM

## 2010-11-04 LAB — POCT INR: INR: 1.7

## 2010-11-04 NOTE — Telephone Encounter (Signed)
Scott from [facility]. B/p today 96/56 on right ./ 98/58 on left . pulse 76. resp 20 decrease. 98.1 temp. coumdain level 1.7 today. No complaints.

## 2010-11-04 NOTE — Telephone Encounter (Signed)
Dr Elmon [facility] calling to report ms Mode' vital signs--98/58--L /96/56--R--p76--r20--no complaints--they stated they have held lasix, held coreg 12.5,  11/03/10  ,decreased coreg to 1/2 tab coreg 12.5mg  BID starting 11/04/10--if you would like anyother changes, please let us know--nt

## 2010-11-04 NOTE — Telephone Encounter (Signed)
Dr hochrein--please see earlier note--thanks nt

## 2010-11-04 NOTE — Telephone Encounter (Signed)
I agree with the changes.  No other changes suggested.

## 2010-11-08 NOTE — Telephone Encounter (Signed)
[name] from [facility]   PT BP today, 8/20 Sitting 90/60 standing 82/50 Pt is also c/o of feeling tired and lethargic. Pt BP is still dropping, medication alteration does not seem to be helping. If possible [name] advises that pt see Dr. Antoine Poche asap.

## 2010-11-09 ENCOUNTER — Ambulatory Visit (INDEPENDENT_AMBULATORY_CARE_PROVIDER_SITE_OTHER): Payer: Medicare Other | Admitting: Internal Medicine

## 2010-11-09 ENCOUNTER — Ambulatory Visit: Payer: Medicare Other | Admitting: Cardiology

## 2010-11-09 DIAGNOSIS — I2589 Other forms of chronic ischemic heart disease: Secondary | ICD-10-CM

## 2010-11-09 DIAGNOSIS — Z7901 Long term (current) use of anticoagulants: Secondary | ICD-10-CM

## 2010-11-09 LAB — POCT INR: INR: 3

## 2010-11-09 NOTE — Telephone Encounter (Signed)
Scott with [facility] called with orthostatics today  108/72  HR 68, sitting 100/58 and  Standing 96/48.  Pt is still off Lasix and urination is less.  Still complaining of lethargy and weakness worse when she is up walking around.  INR today 3.0  Will forward to Dr Antoine Poche for review.

## 2010-11-09 NOTE — Telephone Encounter (Signed)
Pt has an appointment with Dr Antoine Poche 11/23/2010.

## 2010-11-09 NOTE — Telephone Encounter (Signed)
Mild orthostasis.  She needs follow up with me in the next 2 weeks.

## 2010-11-16 ENCOUNTER — Encounter: Payer: Self-pay | Admitting: Internal Medicine

## 2010-11-16 ENCOUNTER — Ambulatory Visit (INDEPENDENT_AMBULATORY_CARE_PROVIDER_SITE_OTHER): Payer: Self-pay | Admitting: Cardiology

## 2010-11-16 ENCOUNTER — Ambulatory Visit (INDEPENDENT_AMBULATORY_CARE_PROVIDER_SITE_OTHER): Payer: Medicare Other | Admitting: Internal Medicine

## 2010-11-16 DIAGNOSIS — I2589 Other forms of chronic ischemic heart disease: Secondary | ICD-10-CM

## 2010-11-16 DIAGNOSIS — I4891 Unspecified atrial fibrillation: Secondary | ICD-10-CM

## 2010-11-16 DIAGNOSIS — R0989 Other specified symptoms and signs involving the circulatory and respiratory systems: Secondary | ICD-10-CM

## 2010-11-16 DIAGNOSIS — I5022 Chronic systolic (congestive) heart failure: Secondary | ICD-10-CM

## 2010-11-16 DIAGNOSIS — I255 Ischemic cardiomyopathy: Secondary | ICD-10-CM

## 2010-11-16 DIAGNOSIS — Z95810 Presence of automatic (implantable) cardiac defibrillator: Secondary | ICD-10-CM | POA: Insufficient documentation

## 2010-11-16 LAB — POCT INR: INR: 2.2

## 2010-11-16 NOTE — Assessment & Plan Note (Signed)
She has not been taking her medications apart from 10 mg of carvedilol because of post hospitalization hypotension. I recommend to Dr. Antoine Poche that we consider the use of metoprolol succinate as opposed to carvedilol as it may help reduce ventricular response in the event of recurrent atrial fibrillation.

## 2010-11-16 NOTE — Progress Notes (Signed)
History of Present Illness: Primary Cardiologist:  Dr. Rollene Rotunda PCP:  Dr. Sanda Linger  [name] is a 60 y.o. female who is a prior patient of Dr. Deborah Chalk.  She is status post ICD implantation for primary prevention in the setting of ischemic heart disease under treatment by me in 2005. In 2006 she underwent generator replacement because of a defective device. I have not seen her since.  She was recently hospitalized at the beginning of August 2012 because of multiple inappropriate shocks. This occurred in the context of atrial fibrillation developing following takedown of a colostomy and hypokalemia. Her device was reprogrammed amiodarone was initiated potassium repleted and she was discharged. She has a history of prior exposure to amiodarone which was poorly tolerated; she does not recall why.   Her past medical history includes CAD, status post anterior myocardial infarction treated with a stent in the LAD and staged Cypher drug eluting stenting to the OM-1 in 8/03.  She then had a Cypher drug-eluting stent placed to the LAD in 7/04.  She's had multiple cardiac catheterizations in the past. Her last heart catheterization was 06/17/10 which  demonstrated a patent stent in the LAD and a patent stent in the OM1 with nonobstructive disease elsewhere.  She did have 80% stenosis in a small ostial Dx-1.  She also has an ischemic cardiomyopathy.   Her last echocardiogram 06/08/10: EF 25-30%, anteroseptal and apical akinesis, moderate AI and mild MR. Left atrial size was normal  Normal blood risk factors are noted for hypertension, heart failure, gender, and prior stroke.     Past Medical History  Diagnosis Date  . Spinal stenosis   . Numbness of foot     left foot  . Diverticulitis     Status post partial colectomy 1/12  . Colitis, ischemic   . DJD (degenerative joint disease)     History of multiple surgeries to the back, shoulder and knee  . Ischemic cardiomyopathy     Echo  06/16/10: EF 25-30%, anteroseptal and apical hypokinesis, moderate AI, mild MR  . Chronic systolic heart failure   . CAD (coronary artery disease)     a.  Ant MI 8/03 with stenting of the LAD;  b. staged PCI with Cypher DES to OM1 8/03;  c. s/p Cypher DES to LAD 7/04;  d.  multiple cardiac caths in past (chronically abnl ECG);    e. cardiac catheterization 3/12: LAD stent patent, distal LAD 40-50%, small ostial D1 80%, ostial D2 60%, OM-1 stent patent (20-30%), proximal RCA 50%, proximal to mid RCA 40-50%  . LV (left ventricular) mural thrombus     Chronic Coumadin therapy  . Stroke     History of TIA and possible CVA; hospitalized 2004  . CKD (chronic kidney disease), stage III     creatinine:  1.3 in 4/12;    . Tobacco abuse     history  . GERD (gastroesophageal reflux disease)   . Hypertension   . Lower GI bleed     August 2011  . SVT (supraventricular tachycardia)     ? h/o AFib;  patient on long term amiodarone therapy  . Hypothyroidism   . Aortic insufficiency   . Pseudoaneurysm     History of, right forearm  . ICD (implantable cardiac defibrillator), single, in situ     [employer];  2008    Current Outpatient Prescriptions  Medication Sig Dispense Refill  . amiodarone (PACERONE) 200 MG tablet Take 200 mg by mouth  daily.        . aspirin 81 MG tablet Take 81 mg by mouth daily.        . carvedilol (COREG CR) 20 MG 24 hr capsule Take 20 mg by mouth daily.        . cyclobenzaprine (FLEXERIL) 5 MG tablet Take 5 mg by mouth as needed.   30 tablet  0  . Diclofenac-Misoprostol (ARTHROTEC PO) Take 75 mg by mouth daily.        [name] ezetimibe (ZETIA) 10 MG tablet Take 10 mg by mouth daily.        [name] FA-Pyridoxine-Cyancobalamin (FOLTX PO) Take by mouth daily.        . fish oil-omega-3 fatty acids 1000 MG capsule Take 1,000 mg by mouth daily.        . fluticasone (FLONASE) 50 MCG/ACT nasal spray 2 sprays by Nasal route as needed.        . furosemide (LASIX) 40 MG tablet Take 40 mg by  mouth daily.        [name] levothyroxine (SYNTHROID, LEVOTHROID) 200 MCG tablet Take 200 mcg by mouth daily.        [name] lisinopril (PRINIVIL,ZESTRIL) 2.5 MG tablet Take 1 tablet (2.5 mg total) by mouth 2 (two) times daily.  180 tablet  2  . Methocarbamol (ROBAXIN PO) Take by mouth as needed.        . nitroGLYCERIN (NITROSTAT) 0.4 MG SL tablet Place 1 tablet (0.4 mg total) under the tongue every 5 (five) minutes as needed for chest pain.  25 tablet  3  . Oxycodone-Acetaminophen (PERCOCET PO) Take by mouth as needed.        . Pantoprazole Sodium (PROTONIX PO) Take by mouth daily.        . potassium chloride SA (K-DUR,KLOR-CON) 20 MEQ tablet Take 20 mEq by mouth daily.        [name] warfarin (COUMADIN) 3 MG tablet TAKE AS DIRECTED  135 tablet  1    Allergies: Allergies  Allergen Reactions  . Penicillins   . Statins     Social history:  Ex-smoker.  She is widowed.  Lives by herself.  Family history:  Positive for CAD  ROS:  See history of present illness.  All other systems reviewed and negative.  Vital Signs: BP 124/82  Pulse 66  Ht 5\' 6"  (1.676 m)  Wt 150 lb (68.04 kg)  BMI 24.21 kg/m2  PHYSICAL EXAM: Well nourished, well developed, in no acute distress HEENT: normal Neck: no JVD Endocrine:No thyromegaly Vascular:No carotid bruits Cardiac:  normal S1, S2; RRR; no murmur Lungs:  clear to auscultation bilaterally, no wheezing, rhonchi or rales Abd: soft, nontender, no hepatomegaly, colostomy bag in place (brown stool noted) Ext: no edema Skin: warm and dry Neuro:  CNs 2-12 intact, no focal abnormalities noted Psych:  Normal affect   ASSESSMENT AND PLAN:

## 2010-11-16 NOTE — Assessment & Plan Note (Signed)
The patient's device was interrogated and the information was fully reviewed.  The device was reprogrammed to prolong the duration to declare an arrhythmia.  The mechanism of SVT discrimination in his device was overwhelmed by her episode of atrial fibrillation. Her sustained rate duration is of no value in this regard; the features of SVT inhibition are not programmable. Hence rate control or the prevention of atrial fibrillation our best options. See the above

## 2010-11-16 NOTE — Assessment & Plan Note (Signed)
The patient had postoperative atrial fibrillation associated with a rapid ventricular response and inappropriate ICD discharges. Partially calmer her device was unable to make the distinction. (See below).  We should try and prevent atrial fibrillation. It may be that simply preventing surgeries and maintain potassium will be sufficient. However, amiodarone was then initiated I think it is worth trying it. In the event that it failed, but she failed to tolerate it, I would wait for her to have recurrent atrial fibrillation prior to proceed with ablative therapy either with Pvi or with AV junction ablation device upgrade.  I've asked her to stop her aspirin. She will continue on warfarin.

## 2010-11-16 NOTE — Patient Instructions (Signed)
Your physician has recommended you make the following change in your medication:  1) restart amiodarone at 200mg  one tablet daily.  Your physician recommends that you schedule a follow-up appointment in: 3 months.

## 2010-11-16 NOTE — Assessment & Plan Note (Signed)
As above.

## 2010-11-17 LAB — ICD DEVICE OBSERVATION
BATTERY VOLTAGE: 2.64 V
CHARGE TIME: 16.9 s
DEVICE MODEL ICD: 107771
FVT: 0
PACEART VT: 0
RV LEAD THRESHOLD: 0.4 V
RV LEAD THRESHOLD: 0.4 V
TOT-0001: 9
TZAT-0005FASTVT: 81 pct
TZAT-0012FASTVT: 200 ms
TZAT-0012FASTVT: 200 ms
TZAT-0013FASTVT: 2
TZAT-0013FASTVT: 2
TZAT-0018FASTVT: NEGATIVE
TZAT-0019FASTVT: 7.5 V
TZAT-0020FASTVT: 1 ms
TZAT-0020FASTVT: 1 ms
TZON-0003FASTVT: 333 ms
TZON-0004FASTVT: 2.5
TZST-0001FASTVT: 3
TZST-0001FASTVT: 5
TZST-0001FASTVT: 6
TZST-0003FASTVT: 31 J
TZST-0003FASTVT: 31 J
VENTRICULAR PACING ICD: 0 pct
VF: 0

## 2010-11-23 ENCOUNTER — Encounter: Payer: Medicare Other | Admitting: Cardiology

## 2010-11-23 ENCOUNTER — Encounter: Payer: Self-pay | Admitting: Cardiology

## 2010-11-23 ENCOUNTER — Ambulatory Visit (INDEPENDENT_AMBULATORY_CARE_PROVIDER_SITE_OTHER): Payer: Medicare Other | Admitting: Cardiology

## 2010-11-23 DIAGNOSIS — I255 Ischemic cardiomyopathy: Secondary | ICD-10-CM

## 2010-11-23 DIAGNOSIS — I2510 Atherosclerotic heart disease of native coronary artery without angina pectoris: Secondary | ICD-10-CM

## 2010-11-23 DIAGNOSIS — N189 Chronic kidney disease, unspecified: Secondary | ICD-10-CM

## 2010-11-23 DIAGNOSIS — I10 Essential (primary) hypertension: Secondary | ICD-10-CM

## 2010-11-23 DIAGNOSIS — I2589 Other forms of chronic ischemic heart disease: Secondary | ICD-10-CM

## 2010-11-23 DIAGNOSIS — I5022 Chronic systolic (congestive) heart failure: Secondary | ICD-10-CM

## 2010-11-23 LAB — BASIC METABOLIC PANEL
BUN: 12 mg/dL (ref 6–23)
CO2: 26 mEq/L (ref 19–32)
Chloride: 105 mEq/L (ref 96–112)
Glucose, Bld: 124 mg/dL — ABNORMAL HIGH (ref 70–99)
Potassium: 4.1 mEq/L (ref 3.5–5.1)
Sodium: 142 mEq/L (ref 135–145)

## 2010-11-23 MED ORDER — LISINOPRIL 2.5 MG PO TABS: 2.5000 mg | ORAL_TABLET | Freq: Every day | ORAL | Status: DC

## 2010-11-23 MED ORDER — EZETIMIBE 10 MG PO TABS: 10.0000 mg | ORAL_TABLET | Freq: Every day | ORAL | Status: DC

## 2010-11-23 MED ORDER — CARVEDILOL 12.5 MG PO TABS: 12.5000 mg | ORAL_TABLET | Freq: Two times a day (BID) | ORAL | Status: DC

## 2010-11-23 NOTE — Assessment & Plan Note (Signed)
This is being managed to contact me her cardiomyopathy. Her blood pressure is creeping up which allows me to restart ACE inhibitor.

## 2010-11-23 NOTE — Assessment & Plan Note (Signed)
I will check a basic metabolic profile today.

## 2010-11-23 NOTE — Patient Instructions (Signed)
Please start Lisinopril 2.5 mg a day.  Change Coreg CR to Carvedilol 12.5 mg one twice a day.  Continue all other medication as listed.  Please have blood work today and again in 2 weeks (BMP)  Follow up with Dr Antoine Poche in 6 weeks.

## 2010-11-23 NOTE — Assessment & Plan Note (Signed)
She has had no symptomatic recurrence of this. She will continue on amiodarone and Coumadin.

## 2010-11-23 NOTE — Progress Notes (Signed)
HPI The patient presents for followup of her ischemic cardiomyopathy. She was previously seen by Dr. Deborah Chalk.  I reviewed available records including hospital records and Dr. Odessa Fleming recent note. In the hospital following ICD firing which was for atrial fibrillation she had some hypotension. This was following her recent bowel surgery as well. She has been off diuretics and ACE inhibitor. However, recently she had some increased dyspnea and she self medicated with Lasix and potassium and nitroglycerin. She said when she gains fluid is always has shortness of breath and not his weight gain or edema. She is watching her salt. She is watching her fluid. She is daily weight. She's had no new palpitations, presyncope or syncope. She has had no new chest pressure, neck or arm discomfort. She's had no PND or orthopnea.  Allergies  Allergen Reactions  . Penicillins   . Statins     Current Outpatient Prescriptions  Medication Sig Dispense Refill  . amiodarone (PACERONE) 200 MG tablet Take 200 mg by mouth daily.        . carvedilol (COREG CR) 20 MG 24 hr capsule Take 10 mg by mouth daily.       . cyclobenzaprine (FLEXERIL) 5 MG tablet Take 5 mg by mouth as needed.   30 tablet  0  . Diclofenac-Misoprostol (ARTHROTEC PO) Take 75 mg by mouth daily.        [name] ezetimibe (ZETIA) 10 MG tablet Take 10 mg by mouth daily.        [name] FA-Pyridoxine-Cyancobalamin (FOLTX PO) Take by mouth daily.        . fexofenadine (ALLEGRA) 180 MG tablet Take 180 mg by mouth daily.        . fish oil-omega-3 fatty acids 1000 MG capsule Take 1,000 mg by mouth daily.        . fluticasone (FLONASE) 50 MCG/ACT nasal spray Place 2 sprays into the nose as needed.        [name] levothyroxine (SYNTHROID, LEVOTHROID) 200 MCG tablet Take 200 mcg by mouth daily.        . Methocarbamol (ROBAXIN PO) Take by mouth as needed.        . metolazone (ZAROXOLYN) 2.5 MG tablet Take 2.5 mg by mouth as needed.       . nitroGLYCERIN (NITROSTAT) 0.4 MG SL tablet  Place 1 tablet (0.4 mg total) under the tongue every 5 (five) minutes as needed for chest pain.  25 tablet  3  . Oxycodone-Acetaminophen (PERCOCET PO) Take by mouth as needed.        . Pantoprazole Sodium (PROTONIX PO) Take by mouth daily.        [name] warfarin (COUMADIN) 3 MG tablet TAKE AS DIRECTED  135 tablet  1    Past Medical History  Diagnosis Date  . Spinal stenosis   . Numbness of foot     left foot  . Diverticulitis     Status post partial colectomy 1/12  . Colitis, ischemic   . DJD (degenerative joint disease)     History of multiple surgeries to the back, shoulder and knee  . Ischemic cardiomyopathy     Echo 06/16/10: EF 25-30%, anteroseptal and apical hypokinesis, moderate AI, mild MR  . Chronic systolic heart failure   . CAD (coronary artery disease)     a.  Ant MI 8/03 with stenting of the LAD;  b. staged PCI with Cypher DES to OM1 8/03;  c. s/p Cypher DES to LAD 7/04;  d.  multiple cardiac  caths in past (chronically abnl ECG);    e. cardiac catheterization 3/12: LAD stent patent, distal LAD 40-50%, small ostial D1 80%, ostial D2 60%, OM-1 stent patent (20-30%), proximal RCA 50%, proximal to mid RCA 40-50%  . LV (left ventricular) mural thrombus     Chronic Coumadin therapy  . Stroke     History of TIA and possible CVA; hospitalized 2004  . CKD (chronic kidney disease), stage III     creatinine:  1.3 in 4/12;    . Tobacco abuse     history  . GERD (gastroesophageal reflux disease)   . Hypertension   . Lower GI bleed     August 2011  . SVT (supraventricular tachycardia)     ? h/o AFib;  patient on long term amiodarone therapy  . Hypothyroidism   . Aortic insufficiency   . Pseudoaneurysm     History of, right forearm  . ICD (implantable cardiac defibrillator), single, in situ     [employer];  2008  . CHF (congestive heart failure)   . Hyperlipidemia   . OA (osteoarthritis)     Past Surgical History  Procedure Date  . Cardiac catheterization 06/2009  .  Insert / replace / remove pacemaker   . Icd   . Back surgery   . Shoulder surgery   . Knee surgery   . Colon surgery     ROS:  As stated in the HPI and negative for all other systems.  PHYSICAL EXAM BP 147/86  Pulse 93  Resp 16  Ht 5\' 6"  (1.676 m)  Wt 144 lb (65.318 kg)  BMI 23.24 kg/m2 GENERAL:  Well appearing HEENT:  Pupils equal round and reactive, fundi not visualized, oral mucosa unremarkable NECK:  No jugular venous distention, waveform within normal limits, carotid upstroke brisk and symmetric, no bruits, no thyromegaly LYMPHATICS:  No cervical, inguinal adenopathy LUNGS:  Clear to auscultation bilaterally BACK:  No CVA tenderness CHEST:  Unremarkable, ICD HEART:  PMI not displaced or sustained,S1 and S2 within normal limits, no S3, no S4, no clicks, no rubs, no murmurs ABD:  Flat, positive bowel sounds normal in frequency in pitch, no bruits, no rebound, no guarding, no midline pulsatile mass, no hepatomegaly, no splenomegaly EXT:  2 plus pulses throughout, no edema, no cyanosis no clubbing SKIN:  No rashes no nodules NEURO:  Cranial nerves II through XII grossly intact, motor grossly intact throughout PSYCH:  Cognitively intact, oriented to person place and time   ASSESSMENT AND PLAN

## 2010-11-23 NOTE — Assessment & Plan Note (Addendum)
We discussed salt and fluid restriction.  We discussed PRN lasix dosing.  She will switch to IR Coreg 12.5 bid and I will restart ACE.  I will check a BMET today.   (Greater than 40 minutes reviewing all data with greater than 50% face to face with the patient).

## 2010-11-23 NOTE — Assessment & Plan Note (Signed)
The patient has no new sypmtoms.  No further cardiovascular testing is indicated.  We will continue with aggressive risk reduction and meds as listed.  

## 2010-11-25 ENCOUNTER — Ambulatory Visit (INDEPENDENT_AMBULATORY_CARE_PROVIDER_SITE_OTHER): Payer: Self-pay | Admitting: Cardiology

## 2010-11-25 DIAGNOSIS — R0989 Other specified symptoms and signs involving the circulatory and respiratory systems: Secondary | ICD-10-CM

## 2010-11-25 LAB — HM MAMMOGRAPHY: HM Mammogram: NORMAL

## 2010-11-25 NOTE — Telephone Encounter (Signed)
OK to continue - Scott aware and will send an order to be signed.

## 2010-11-25 NOTE — Telephone Encounter (Signed)
Roxanne from [facility]. Pt was seen today.  Requesting  an extension for home aide for  2 x week for 3 weeks.

## 2010-12-03 ENCOUNTER — Telehealth: Payer: Self-pay | Admitting: *Deleted

## 2010-12-03 NOTE — Telephone Encounter (Signed)
Per [facility] call  States pt has had a 3 lb wt increase is having some SOB and feels like she has some fluid in her lungs.  did use 1 SL Ntg.  She is not having chest pain.  BP 129/69  HR 70.  Pt has taken her prn Lasix 40 mg this am along with her 20 mEq of KCL.  She will call the MD on call this weekend if wt continues to climb.  She has a follow up appointment on Tuesday with Dr Antoine Poche.

## 2010-12-05 NOTE — H&P (Signed)
[name], DANCER NO.:  [id number]  MEDICAL RECORD NO.:  [id number]  LOCATION:  3701                         FACILITY:  [facility]  PHYSICIAN:  Armanda Magic, M.D.     DATE OF BIRTH:  [birth year]  DATE OF ADMISSION:  10/23/2010 DATE OF DISCHARGE:  10/25/2010                             HISTORY & PHYSICAL   CHIEF COMPLAINT:  Defibrillator discharge.  HISTORY OF PRESENT ILLNESS:  This is a 60 year old female with a history of ischemic cardiomyopathy, EF 30%, CAD, LV thrombus on Coumadin who was in her usual state of health until 8:00 a.m. on the day of admission when she awoke and was awakened, feeling well and got up and had sudden ICD shock and knocking her off her feet.  She said she crawled to the front door and locked and called EMS.  She laid down and her ICD went off again.  EMS brought the patient to the emergency room.  She has had a recent admission for a reverse colostomy and was discharged on October 17, 2010, doing well at that time.  She did not feel bad prior to the AICD shocks.  ALLERGIES:  PENICILLIN and STATINS.  CURRENT MEDICATIONS: 1. Amiodarone daily. 2. Aspirin 81 mg daily. 3. Carvedilol CR 20 mg daily. 4. Flexeril p.r.n. 5. Diclofenac p.r.n. 6. Zetia 10 mg daily. 7. Foltx. 8. Lasix 40 mg daily. 9. Levothyroxine 200 mcg daily. 10.Lisinopril 2.5 mg daily. 11.Percocet p.r.n. 12.Protonix 40 mg daily.  PAST MEDICAL HISTORY: 1. Coronary disease. 2. Systolic heart failure. 3. Hyperlipidemia. 4. Hypertension. 5. History of LV thrombus, currently on systemic anticoagulation,     single chamber AICD.  PAST SURGICAL HISTORY:  Colostomy reversal, single chamber AICD placement.  SOCIAL HISTORY:  She lives in [place].  She is a former smoker.  FAMILY HISTORY:  Significant for coronary disease with both the mother and the father.  REVIEW OF SYSTEMS:  Other than what is stated in HPI is negative.  PHYSICAL EXAM:  GENERAL:  This a  well-developed, well-nourished female in no acute distress. HEENT: Benign. NECK:  Supple without lymphadenopathy.  Carotid upstrokes are +2 bilaterally.  No bruits. LUNGS:  Clear to auscultation throughout. HEART:  Regular rate and rhythm.  No murmurs, rubs, or gallops.  Normal S1 and S2. ABDOMEN:  There are staples in the mid abdomen from her recent surgery. EXTREMITIES:  No edema.  Chest x-ray shows stable appearance with AICD in place.  EKG shows sinus rhythm at 75 beats per minute.  LABORATORY DATA:  Sodium 143, potassium 3.2, chloride 103, bicarb 26, BUN 6, creatinine 1.2, glucose 117.  White cell count 6.3, hemoglobin 10.1, hematocrit 29, platelet count 236.  CPK 76, MB 2.7, troponin 0.3.  ASSESSMENT: 1. Automatic implantable cardioverter-defibrillator discharge in the     setting of supraventricular tachycardia versus atrial fibrillation     with rapid ventricular response.  Pacemaker has been interrogated     by guide rep and the strips were reviewed.  It appears that the     patient went to atrial fibrillation with rapid ventricular response     with rates anywhere from 116 to 232 beats per minute.  Most of     appeared to be AFib, but 2 EMGs showed different morphology in the     ventricular EMG, questionable V-tach but appear irregular, which     was more consistent with atrial fibrillation with rapid ventricular     response and aberration.  The patient's potassium is 3.2.  This     most likely is what triggered the arrhythmias.  PLAN:  We will admit to CCU and replete potassium.  We will start amiodarone 150 mg bolus, then a drip at 1 mg per minute x6 hours, then decrease to 0.5 mg per minute.  We will send off an amiodarone level to make sure the patient is therapeutic.  I did discuss with Dr. Graciela Husbands and we will go ahead and add Lopressor 25 mg b.i.d.  In discussion with Dr. Graciela Husbands, we have decided to change the first VT zone detection to 180 beats per minute and  continue current therapy and remove the VT2 zone. We will keep V-fib zone at 40 beats per minute.  We will turn off sustained rate detection and turn on enhanced detection for irregular rate.     Armanda Magic, M.D.     TT/MEDQ  D:  11/16/2010  T:  11/16/2010  Job:  [id number]  Electronically Signed by Armanda Magic M.D. on 12/05/2010 03:13:29 PM

## 2010-12-07 ENCOUNTER — Other Ambulatory Visit (INDEPENDENT_AMBULATORY_CARE_PROVIDER_SITE_OTHER): Payer: Medicare Other | Admitting: *Deleted

## 2010-12-07 DIAGNOSIS — N189 Chronic kidney disease, unspecified: Secondary | ICD-10-CM

## 2010-12-07 DIAGNOSIS — I5022 Chronic systolic (congestive) heart failure: Secondary | ICD-10-CM

## 2010-12-07 LAB — BASIC METABOLIC PANEL
BUN: 14 mg/dL (ref 6–23)
CO2: 26 mEq/L (ref 19–32)
Chloride: 105 mEq/L (ref 96–112)
Creatinine, Ser: 1.1 mg/dL (ref 0.4–1.2)
Glucose, Bld: 128 mg/dL — ABNORMAL HIGH (ref 70–99)
Potassium: 4.2 mEq/L (ref 3.5–5.1)

## 2010-12-08 LAB — I-STAT 8, (EC8 V) (CONVERTED LAB)
BUN: 18
Chloride: 106
Glucose, Bld: 129 — ABNORMAL HIGH
Potassium: 3.8
pH, Ven: 7.484 — ABNORMAL HIGH

## 2010-12-08 LAB — CBC
Hemoglobin: 11.4 — ABNORMAL LOW
MCHC: 34
MCV: 104.8 — ABNORMAL HIGH
RBC: 3.2 — ABNORMAL LOW

## 2010-12-08 LAB — POCT I-STAT CREATININE
Creatinine, Ser: 1.5 — ABNORMAL HIGH
Operator id: 151321

## 2010-12-08 LAB — PROTIME-INR
INR: 1.7 — ABNORMAL HIGH
Prothrombin Time: 20.3 — ABNORMAL HIGH

## 2010-12-08 LAB — MAGNESIUM: Magnesium: 2

## 2010-12-08 LAB — APTT: aPTT: 28

## 2010-12-08 LAB — BASIC METABOLIC PANEL
CO2: 27
Calcium: 8.4
Creatinine, Ser: 1.4 — ABNORMAL HIGH
Glucose, Bld: 98

## 2010-12-08 LAB — CARDIAC PANEL(CRET KIN+CKTOT+MB+TROPI)
Relative Index: INVALID
Troponin I: 0.05

## 2010-12-08 LAB — POCT CARDIAC MARKERS: CKMB, poc: 2.2

## 2010-12-09 ENCOUNTER — Ambulatory Visit (INDEPENDENT_AMBULATORY_CARE_PROVIDER_SITE_OTHER): Payer: Self-pay

## 2010-12-09 DIAGNOSIS — R0989 Other specified symptoms and signs involving the circulatory and respiratory systems: Secondary | ICD-10-CM

## 2010-12-14 LAB — POCT I-STAT, CHEM 8
Calcium, Ion: 1.11 — ABNORMAL LOW
Creatinine, Ser: 1.4 — ABNORMAL HIGH
Glucose, Bld: 181 — ABNORMAL HIGH
Hemoglobin: 13.9
Sodium: 134 — ABNORMAL LOW
TCO2: 31

## 2010-12-14 LAB — POCT CARDIAC MARKERS
CKMB, poc: <1 — ABNORMAL LOW
Myoglobin, poc: 115
Myoglobin, poc: 62
Myoglobin, poc: 96
Operator id: 285841
Operator id: 285841
Troponin i, poc: <0.05

## 2010-12-14 LAB — URINALYSIS, ROUTINE W REFLEX MICROSCOPIC
Glucose, UA: NEGATIVE
Hgb urine dipstick: NEGATIVE
Specific Gravity, Urine: 1.006
Urobilinogen, UA: 0.2

## 2010-12-14 LAB — PROTIME-INR: Prothrombin Time: 28.2 — ABNORMAL HIGH

## 2010-12-15 LAB — URINE MICROSCOPIC-ADD ON

## 2010-12-15 LAB — URINALYSIS, ROUTINE W REFLEX MICROSCOPIC
Nitrite: NEGATIVE
Protein, ur: 30 — AB
Specific Gravity, Urine: 1.029
Urobilinogen, UA: 0.2

## 2010-12-15 LAB — BASIC METABOLIC PANEL
Calcium: 9.1
Creatinine, Ser: 1.03
GFR calc Af Amer: >60
GFR calc non Af Amer: 55 — ABNORMAL LOW
Sodium: 141

## 2010-12-15 LAB — CBC
Hemoglobin: 13.4
RBC: 3.69 — ABNORMAL LOW
WBC: 7.9

## 2010-12-15 LAB — PROTIME-INR
INR: 1.1
INR: 1.2
Prothrombin Time: 15.7 — ABNORMAL HIGH

## 2010-12-15 LAB — APTT
aPTT: 28
aPTT: 29

## 2010-12-16 ENCOUNTER — Ambulatory Visit (INDEPENDENT_AMBULATORY_CARE_PROVIDER_SITE_OTHER): Payer: Self-pay | Admitting: Cardiology

## 2010-12-16 LAB — POCT INR: INR: 2.7

## 2010-12-17 ENCOUNTER — Other Ambulatory Visit: Payer: Self-pay | Admitting: *Deleted

## 2010-12-17 ENCOUNTER — Telehealth: Payer: Self-pay | Admitting: Cardiology

## 2010-12-17 DIAGNOSIS — I2510 Atherosclerotic heart disease of native coronary artery without angina pectoris: Secondary | ICD-10-CM

## 2010-12-17 DIAGNOSIS — I5022 Chronic systolic (congestive) heart failure: Secondary | ICD-10-CM

## 2010-12-17 MED ORDER — AMIODARONE HCL 200 MG PO TABS: 200.0000 mg | ORAL_TABLET | Freq: Every day | ORAL | Status: DC

## 2010-12-17 MED ORDER — CARVEDILOL 12.5 MG PO TABS: 12.5000 mg | ORAL_TABLET | Freq: Two times a day (BID) | ORAL | Status: DC

## 2010-12-17 MED ORDER — NITROGLYCERIN 0.4 MG SL SUBL: 0.4000 mg | SUBLINGUAL_TABLET | SUBLINGUAL | Status: DC | PRN

## 2010-12-17 MED ORDER — WARFARIN SODIUM 3 MG PO TABS: ORAL_TABLET | ORAL | Status: DC

## 2010-12-17 NOTE — Telephone Encounter (Signed)
[name] is calling to request verbal order for home PT two times a week for four weeks

## 2010-12-20 HISTORY — PX: COLOSTOMY REVERSAL: SHX5782

## 2010-12-22 ENCOUNTER — Encounter: Payer: Self-pay | Admitting: Internal Medicine

## 2010-12-22 ENCOUNTER — Ambulatory Visit (INDEPENDENT_AMBULATORY_CARE_PROVIDER_SITE_OTHER): Payer: Medicare Other | Admitting: Internal Medicine

## 2010-12-22 ENCOUNTER — Other Ambulatory Visit (INDEPENDENT_AMBULATORY_CARE_PROVIDER_SITE_OTHER): Payer: Medicare Other

## 2010-12-22 ENCOUNTER — Telehealth: Payer: Self-pay | Admitting: Cardiology

## 2010-12-22 VITALS — BP 112/58 | HR 64 | Temp 98.1°F | Resp 16 | Wt 147.0 lb

## 2010-12-22 DIAGNOSIS — D649 Anemia, unspecified: Secondary | ICD-10-CM

## 2010-12-22 DIAGNOSIS — Z23 Encounter for immunization: Secondary | ICD-10-CM

## 2010-12-22 DIAGNOSIS — E039 Hypothyroidism, unspecified: Secondary | ICD-10-CM

## 2010-12-22 DIAGNOSIS — I639 Cerebral infarction, unspecified: Secondary | ICD-10-CM

## 2010-12-22 DIAGNOSIS — I6350 Cerebral infarction due to unspecified occlusion or stenosis of unspecified cerebral artery: Secondary | ICD-10-CM

## 2010-12-22 LAB — CBC WITH DIFFERENTIAL/PLATELET
Basophils Absolute: 0 10*3/uL (ref 0.0–0.1)
Hemoglobin: 11.1 g/dL — ABNORMAL LOW (ref 12.0–15.0)
Lymphocytes Relative: 33.1 % (ref 12.0–46.0)
Monocytes Relative: 17.3 % — ABNORMAL HIGH (ref 3.0–12.0)
Neutrophils Relative %: 48.5 % (ref 43.0–77.0)
Platelets: 129 10*3/uL — ABNORMAL LOW (ref 150.0–400.0)
RDW: 16.4 % — ABNORMAL HIGH (ref 11.5–14.6)

## 2010-12-22 LAB — IBC PANEL
Iron: 94 ug/dL (ref 42–145)
Transferrin: 266.9 mg/dL (ref 212.0–360.0)

## 2010-12-22 LAB — FERRITIN: Ferritin: 71.4 ng/mL (ref 10.0–291.0)

## 2010-12-22 LAB — VITAMIN B12: Vitamin B-12: 459 pg/mL (ref 211–911)

## 2010-12-22 NOTE — Progress Notes (Signed)
Subjective:    Patient ID: [name], female    DOB: 01-31-1951, 60 y.o.   MRN: [id number]  Thyroid Problem Presents for follow-up visit. Symptoms include fatigue. Patient reports no anxiety, cold intolerance, constipation, depressed mood, diaphoresis, diarrhea, dry skin, hair loss, heat intolerance, hoarse voice, leg swelling, menstrual problem, nail problem, palpitations, tremors, visual change, weight gain or weight loss. The symptoms have been stable.  Anemia Presents for follow-up visit. There has been no abdominal pain, anorexia, bruising/bleeding easily, confusion, fever, leg swelling, light-headedness, malaise/fatigue, pallor, palpitations, paresthesias, pica or weight loss. Signs of blood loss that are not present include hematemesis, hematochezia, melena, menorrhagia and vaginal bleeding. There are no compliance problems.  Side effects of medications include fatigue.      Review of Systems  Constitutional: Positive for fatigue. Negative for fever, chills, weight loss, weight gain, malaise/fatigue, diaphoresis, activity change, appetite change and unexpected weight change.  HENT: Negative.  Negative for hoarse voice.   Eyes: Negative.  Negative for photophobia, pain, discharge, redness, itching and visual disturbance.  Respiratory: Negative.   Cardiovascular: Negative.  Negative for chest pain, palpitations and leg swelling.  Gastrointestinal: Negative for nausea, vomiting, abdominal pain, diarrhea, constipation, blood in stool, melena, hematochezia, abdominal distention, anal bleeding, anorexia and hematemesis.  Genitourinary: Negative for dysuria, urgency, frequency, hematuria, flank pain, decreased urine volume, vaginal bleeding, difficulty urinating, menstrual problem, dyspareunia and menorrhagia.  Musculoskeletal: Negative for myalgias, back pain, joint swelling, arthralgias and gait problem.  Skin: Negative for color change, pallor, rash and wound.  Neurological: Positive  for dizziness. Negative for tremors, seizures, syncope, facial asymmetry, speech difficulty, weakness, light-headedness, numbness, headaches and paresthesias.  Hematological: Negative for cold intolerance, heat intolerance and adenopathy. Does not bruise/bleed easily.  Psychiatric/Behavioral: Negative.  Negative for confusion.       Objective:   Physical Exam  Vitals reviewed. Constitutional: She is oriented to person, place, and time. She appears well-developed and well-nourished. No distress.  HENT:  Mouth/Throat: Oropharynx is clear and moist. No oropharyngeal exudate.  Eyes: Conjunctivae are normal. Right eye exhibits no discharge. Left eye exhibits no discharge. No scleral icterus.  Neck: Normal range of motion. Neck supple. No JVD present. No tracheal deviation present. No thyromegaly present.  Cardiovascular: Normal rate, regular rhythm, normal heart sounds and intact distal pulses.  Exam reveals no gallop and no friction rub.   No murmur heard. Pulses:      Carotid pulses are 1+ on the right side, and 1+ on the left side.      Radial pulses are 1+ on the right side, and 1+ on the left side.       Femoral pulses are 1+ on the right side, and 1+ on the left side.      Popliteal pulses are 1+ on the right side, and 1+ on the left side.       Dorsalis pedis pulses are 1+ on the right side, and 1+ on the left side.       Posterior tibial pulses are 1+ on the right side, and 1+ on the left side.  Pulmonary/Chest: Effort normal and breath sounds normal. No stridor. No respiratory distress. She has no wheezes. She has no rales. She exhibits no tenderness.  Abdominal: Soft. Bowel sounds are normal. She exhibits no distension and no mass. There is no tenderness. There is no rebound and no guarding.  Musculoskeletal: Normal range of motion. She exhibits no edema and no tenderness.  Lymphadenopathy:    She has  no cervical adenopathy.  Neurological: She is oriented to person, place, and time.  She displays normal reflexes. No cranial nerve deficit. She exhibits normal muscle tone. Coordination normal.  Skin: Skin is warm and dry. No rash noted. She is not diaphoretic. No erythema. No pallor.  Psychiatric: She has a normal mood and affect. Her behavior is normal. Judgment and thought content normal.     Lab Results  Component Value Date   WBC 6.3 10/23/2010   HGB 11.2* 10/23/2010   HCT 33.0* 10/23/2010   PLT 236 10/23/2010   CHOL 155 10/24/2010   TRIG 73 10/24/2010   HDL 69 10/24/2010   ALT 10 10/23/2010   AST 20 10/23/2010   NA 141 12/07/2010   K 4.2 12/07/2010   CL 105 12/07/2010   CREATININE 1.1 12/07/2010   BUN 14 12/07/2010   CO2 26 12/07/2010   TSH 6.400* 10/23/2010   INR 2.7 12/16/2010   HGBA1C  Value: 4.8 (NOTE)                                                                       According to the ADA Clinical Practice Recommendations for 2011, when HbA1c is used as a screening test:   >=6.5%   Diagnostic of Diabetes Mellitus           (if abnormal result  is confirmed)  5.7-6.4%   Increased risk of developing Diabetes Mellitus  References:Diagnosis and Classification of Diabetes Mellitus,Diabetes Care,2011,34(Suppl 1):S62-S69 and Standards of Medical Care in         Diabetes - 2011,Diabetes Care,2011,34  (Suppl 1):S11-S61. 06/16/2010      Assessment & Plan:

## 2010-12-22 NOTE — Patient Instructions (Signed)
Hypothyroidism The thyroid is a large gland located in the lower front of your neck. The thyroid gland helps control metabolism. Metabolism is how your body handles food. It controls metabolism with the hormone thyroxine. When this gland is underactive (hypothyroid), it produces too little hormone.  SYMPTOMS OF HYPOTHYROIDISM  Lethargy (feeling as though you have no energy)   Cold intolerance   Weight gain (in spite of normal food intake)   Dry skin   Coarse hair   Menstrual irregularity (if severe, may lead to infertility)   Slowing of thought processes  Cardiac problems are also caused by insufficient amounts of thyroid hormone. Hypothyroidism in the newborn is cretinism, and is an extreme form. It is important that this form be treated adequately and immediately or it will lead rapidly to retarded physical and mental development. CAUSES OF HYPOTHYROIDISM These include:   Absence or destruction of thyroid tissue.  Goiter due to iodine deficiency.   Goiter due to medications.   Congenital defects (since birth).  Problems with the pituitary. This causes a lack of TSH (thyroid stimulating hormone). This hormone tells the thyroid to turn out more hormone.   DIAGNOSIS To prove hypothyroidism, your caregiver may do blood tests and ultrasound tests. Sometimes the signs are hidden. It may be necessary for your caregiver to watch this illness with blood tests either before or after diagnosis and treatment. TREATMENT  Low levels of thyroid hormone are increased by using synthetic thyroid hormone. This is a safe, effective treatment. It usually takes about four weeks to gain the full effects of the medication. After you have the full effect of the medication, it will generally take another four weeks for problems to leave. Your caregiver may start you on low doses. If you have had heart problems the dose may be gradually increased. It is generally not an emergency to get rapidly to  normal. HOME CARE INSTRUCTIONS  Take your medications as your caregiver suggests. Let your caregiver know of any medications you are taking or start taking. Your caregiver will help you with dosage schedules.   As your condition improves, your dosage needs may increase. It will be necessary to have continuing blood tests as suggested by your caregiver.   Report all suspected medication side effects to your caregiver.  SEEK MEDICAL CARE IF YOU DEVELOP:  Sweating.  Tremulousness (tremors).   Anxiety.   Rapid weight loss.   Heat intolerance.  Emotional swings.   Diarrhea.   Weakness.   SEEK IMMEDIATE MEDICAL CARE IF: You develop chest pain, an irregular heart beat (palpitations), or a rapid heart beat. MAKE SURE YOU:   Understand these instructions.   Will watch your condition.   Will get help right away if you are not doing well or get worse.  Document Released: 03/07/2005 Document Re-Released: 02/18/2008 Exit[employer] Patient Information 2011 [employer]. 

## 2010-12-22 NOTE — Assessment & Plan Note (Signed)
I will recheck her CBC and will look at her vitamin levels as well 

## 2010-12-22 NOTE — Telephone Encounter (Addendum)
Pt called asked for copy of Med List, she will pick up today & Sign ROI  12/22/10/km  PT picked up copy of MEd List/ROI signed   12/22/10/km

## 2010-12-22 NOTE — Assessment & Plan Note (Signed)
I will check her TSH to see if this is causing her fatigue

## 2010-12-22 NOTE — Assessment & Plan Note (Signed)
I will recheck her carotids to look for stenosis

## 2010-12-23 ENCOUNTER — Encounter: Payer: Self-pay | Admitting: Internal Medicine

## 2010-12-23 ENCOUNTER — Ambulatory Visit (INDEPENDENT_AMBULATORY_CARE_PROVIDER_SITE_OTHER): Payer: Self-pay | Admitting: Cardiology

## 2010-12-23 DIAGNOSIS — R0989 Other specified symptoms and signs involving the circulatory and respiratory systems: Secondary | ICD-10-CM

## 2010-12-23 LAB — POCT INR: INR: 1.3

## 2010-12-24 ENCOUNTER — Telehealth: Payer: Self-pay | Admitting: *Deleted

## 2010-12-24 DIAGNOSIS — E039 Hypothyroidism, unspecified: Secondary | ICD-10-CM

## 2010-12-24 NOTE — Telephone Encounter (Signed)
Patient requesting a call to discuss results letter she received.

## 2010-12-27 ENCOUNTER — Telehealth: Payer: Self-pay | Admitting: *Deleted

## 2010-12-27 MED ORDER — LEVOTHYROXINE SODIUM 150 MCG PO TABS: 150.0000 ug | ORAL_TABLET | Freq: Every day | ORAL | Status: DC

## 2010-12-27 NOTE — Telephone Encounter (Signed)
Please advise if anything is changing for patient current synthroid medication. Thanks

## 2010-12-27 NOTE — Telephone Encounter (Signed)
Pharm called req RF for Mail order supply of Methocarbamol and hydrocodone/apap. Please advise.

## 2010-12-27 NOTE — Telephone Encounter (Signed)
Yes, see the new RX

## 2010-12-28 ENCOUNTER — Telehealth: Payer: Self-pay

## 2010-12-28 NOTE — Telephone Encounter (Signed)
Patient notified

## 2010-12-28 NOTE — Telephone Encounter (Signed)
sure

## 2010-12-29 ENCOUNTER — Inpatient Hospital Stay (HOSPITAL_COMMUNITY)
Admission: EM | Admit: 2010-12-29 | Discharge: 2010-12-31 | DRG: 293 | Disposition: A | Discharge status: Home Health | Payer: Medicare Other | Attending: Cardiology | Admitting: Cardiology

## 2010-12-29 ENCOUNTER — Emergency Department (HOSPITAL_COMMUNITY): Payer: Medicare Other

## 2010-12-29 DIAGNOSIS — Z7901 Long term (current) use of anticoagulants: Secondary | ICD-10-CM

## 2010-12-29 DIAGNOSIS — E039 Hypothyroidism, unspecified: Secondary | ICD-10-CM | POA: Diagnosis present

## 2010-12-29 DIAGNOSIS — I2510 Atherosclerotic heart disease of native coronary artery without angina pectoris: Secondary | ICD-10-CM | POA: Diagnosis present

## 2010-12-29 DIAGNOSIS — E785 Hyperlipidemia, unspecified: Secondary | ICD-10-CM | POA: Diagnosis present

## 2010-12-29 DIAGNOSIS — I129 Hypertensive chronic kidney disease with stage 1 through stage 4 chronic kidney disease, or unspecified chronic kidney disease: Secondary | ICD-10-CM | POA: Diagnosis present

## 2010-12-29 DIAGNOSIS — Z9861 Coronary angioplasty status: Secondary | ICD-10-CM

## 2010-12-29 DIAGNOSIS — R079 Chest pain, unspecified: Secondary | ICD-10-CM

## 2010-12-29 DIAGNOSIS — Z95810 Presence of automatic (implantable) cardiac defibrillator: Secondary | ICD-10-CM

## 2010-12-29 DIAGNOSIS — I252 Old myocardial infarction: Secondary | ICD-10-CM

## 2010-12-29 DIAGNOSIS — N189 Chronic kidney disease, unspecified: Secondary | ICD-10-CM | POA: Diagnosis present

## 2010-12-29 DIAGNOSIS — M1990 Unspecified osteoarthritis, unspecified site: Secondary | ICD-10-CM | POA: Diagnosis present

## 2010-12-29 DIAGNOSIS — I509 Heart failure, unspecified: Secondary | ICD-10-CM | POA: Diagnosis present

## 2010-12-29 DIAGNOSIS — E876 Hypokalemia: Secondary | ICD-10-CM | POA: Diagnosis present

## 2010-12-29 DIAGNOSIS — I2589 Other forms of chronic ischemic heart disease: Secondary | ICD-10-CM | POA: Diagnosis present

## 2010-12-29 DIAGNOSIS — I4891 Unspecified atrial fibrillation: Secondary | ICD-10-CM | POA: Diagnosis present

## 2010-12-29 DIAGNOSIS — I5023 Acute on chronic systolic (congestive) heart failure: Principal | ICD-10-CM | POA: Diagnosis present

## 2010-12-29 DIAGNOSIS — K219 Gastro-esophageal reflux disease without esophagitis: Secondary | ICD-10-CM | POA: Diagnosis present

## 2010-12-29 DIAGNOSIS — R0609 Other forms of dyspnea: Secondary | ICD-10-CM

## 2010-12-29 DIAGNOSIS — R0989 Other specified symptoms and signs involving the circulatory and respiratory systems: Secondary | ICD-10-CM

## 2010-12-29 LAB — COMPREHENSIVE METABOLIC PANEL
Alkaline Phosphatase: 68 U/L (ref 39–117)
BUN: 14 mg/dL (ref 6–23)
CO2: 25 mEq/L (ref 19–32)
Calcium: 9.4 mg/dL (ref 8.4–10.5)
GFR calc Af Amer: 83 mL/min — ABNORMAL LOW (ref >90)
GFR calc non Af Amer: 71 mL/min — ABNORMAL LOW (ref >90)
Glucose, Bld: 108 mg/dL — ABNORMAL HIGH (ref 70–99)
Total Protein: 7 g/dL (ref 6.0–8.3)

## 2010-12-29 LAB — CBC
Platelets: 173 10*3/uL (ref 150–400)
RBC: 3.62 MIL/uL — ABNORMAL LOW (ref 3.87–5.11)
RDW: 15.5 % (ref 11.5–15.5)
WBC: 5.9 10*3/uL (ref 4.0–10.5)

## 2010-12-29 LAB — POCT I-STAT TROPONIN I: Troponin i, poc: 0.02 ng/mL (ref 0.00–0.08)

## 2010-12-29 MED ORDER — HYDROCODONE-ACETAMINOPHEN 5-325 MG PO TABS: 1.0000 | ORAL_TABLET | ORAL | Status: DC | PRN

## 2010-12-29 MED ORDER — METHOCARBAMOL 500 MG PO TABS: 500.0000 mg | ORAL_TABLET | Freq: Four times a day (QID) | ORAL | Status: DC | PRN

## 2010-12-29 NOTE — Telephone Encounter (Signed)
Closing phone note 

## 2010-12-29 NOTE — Telephone Encounter (Signed)
Patient notified

## 2010-12-30 DIAGNOSIS — R0989 Other specified symptoms and signs involving the circulatory and respiratory systems: Secondary | ICD-10-CM

## 2010-12-30 DIAGNOSIS — I5021 Acute systolic (congestive) heart failure: Secondary | ICD-10-CM

## 2010-12-30 LAB — CARDIAC PANEL(CRET KIN+CKTOT+MB+TROPI)
Relative Index: INVALID (ref 0.0–2.5)
Total CK: 72 U/L (ref 7–177)

## 2010-12-30 LAB — CBC
MCHC: 33.7 g/dL (ref 30.0–36.0)
MCV: 98.1 fL (ref 78.0–100.0)
Platelets: 159 10*3/uL (ref 150–400)
RDW: 15.7 % — ABNORMAL HIGH (ref 11.5–15.5)
WBC: 5 10*3/uL (ref 4.0–10.5)

## 2010-12-30 LAB — COMPREHENSIVE METABOLIC PANEL
ALT: 15 U/L (ref 0–35)
AST: 25 U/L (ref 0–37)
Alkaline Phosphatase: 67 U/L (ref 39–117)
CO2: 28 mEq/L (ref 19–32)
Calcium: 9.3 mg/dL (ref 8.4–10.5)
Chloride: 98 mEq/L (ref 96–112)
GFR calc Af Amer: 82 mL/min — ABNORMAL LOW (ref >90)
GFR calc non Af Amer: 70 mL/min — ABNORMAL LOW (ref >90)
Glucose, Bld: 97 mg/dL (ref 70–99)
Potassium: 3.1 mEq/L — ABNORMAL LOW (ref 3.5–5.1)
Sodium: 139 mEq/L (ref 135–145)
Total Bilirubin: 1 mg/dL (ref 0.3–1.2)

## 2010-12-30 LAB — PRO B NATRIURETIC PEPTIDE: Pro B Natriuretic peptide (BNP): 8189 pg/mL — ABNORMAL HIGH (ref 0–125)

## 2010-12-30 LAB — POCT I-STAT TROPONIN I: Troponin i, poc: 0.03 ng/mL (ref 0.00–0.08)

## 2010-12-30 LAB — TSH: TSH: 0.041 u[IU]/mL — ABNORMAL LOW (ref 0.350–4.500)

## 2010-12-30 LAB — HEMOGLOBIN A1C
Hgb A1c MFr Bld: 5.6 % (ref <5.7)
Mean Plasma Glucose: 114 mg/dL (ref <117)

## 2010-12-30 LAB — LIPID PANEL: LDL Cholesterol: 80 mg/dL (ref 0–99)

## 2010-12-31 ENCOUNTER — Encounter: Payer: Medicare Other | Admitting: *Deleted

## 2010-12-31 DIAGNOSIS — I5023 Acute on chronic systolic (congestive) heart failure: Secondary | ICD-10-CM

## 2010-12-31 LAB — BASIC METABOLIC PANEL
BUN: 17 mg/dL (ref 6–23)
Calcium: 9 mg/dL (ref 8.4–10.5)
Chloride: 101 mEq/L (ref 96–112)
Creatinine, Ser: 1.29 mg/dL — ABNORMAL HIGH (ref 0.50–1.10)
GFR calc Af Amer: 51 mL/min — ABNORMAL LOW (ref >90)

## 2010-12-31 LAB — PROTIME-INR
INR: 3.71 — ABNORMAL HIGH (ref 0.00–1.49)
Prothrombin Time: 37.3 seconds — ABNORMAL HIGH (ref 11.6–15.2)

## 2010-12-31 LAB — PRO B NATRIURETIC PEPTIDE: Pro B Natriuretic peptide (BNP): 2473 pg/mL — ABNORMAL HIGH (ref 0–125)

## 2011-01-03 ENCOUNTER — Ambulatory Visit (INDEPENDENT_AMBULATORY_CARE_PROVIDER_SITE_OTHER): Payer: Self-pay | Admitting: Cardiology

## 2011-01-03 DIAGNOSIS — R0989 Other specified symptoms and signs involving the circulatory and respiratory systems: Secondary | ICD-10-CM

## 2011-01-04 ENCOUNTER — Telehealth: Payer: Self-pay | Admitting: Cardiology

## 2011-01-04 NOTE — Telephone Encounter (Signed)
Called to get verbal order to continue [facility] pt.  2 x wk for 4 wks.  Please notify her with any specific guidelines.

## 2011-01-04 NOTE — Telephone Encounter (Signed)
Home health is wanting to know if it is OK for pt to restart her PT and if there are any parameters they should keep in mind for her in relation to VS.  Will forward to Dr Antoine Poche for review and recommendations.

## 2011-01-04 NOTE — Telephone Encounter (Signed)
OK to resume PT without restriction.

## 2011-01-05 ENCOUNTER — Encounter: Payer: Self-pay | Admitting: Cardiology

## 2011-01-05 ENCOUNTER — Ambulatory Visit (INDEPENDENT_AMBULATORY_CARE_PROVIDER_SITE_OTHER): Payer: Medicare Other | Admitting: *Deleted

## 2011-01-05 ENCOUNTER — Ambulatory Visit (INDEPENDENT_AMBULATORY_CARE_PROVIDER_SITE_OTHER): Payer: Medicare Other | Admitting: Cardiology

## 2011-01-05 DIAGNOSIS — E039 Hypothyroidism, unspecified: Secondary | ICD-10-CM

## 2011-01-05 DIAGNOSIS — I4891 Unspecified atrial fibrillation: Secondary | ICD-10-CM

## 2011-01-05 DIAGNOSIS — I10 Essential (primary) hypertension: Secondary | ICD-10-CM

## 2011-01-05 DIAGNOSIS — I2589 Other forms of chronic ischemic heart disease: Secondary | ICD-10-CM

## 2011-01-05 DIAGNOSIS — I252 Old myocardial infarction: Secondary | ICD-10-CM

## 2011-01-05 DIAGNOSIS — E785 Hyperlipidemia, unspecified: Secondary | ICD-10-CM

## 2011-01-05 DIAGNOSIS — N189 Chronic kidney disease, unspecified: Secondary | ICD-10-CM

## 2011-01-05 DIAGNOSIS — I255 Ischemic cardiomyopathy: Secondary | ICD-10-CM

## 2011-01-05 LAB — BASIC METABOLIC PANEL
BUN: 20 mg/dL (ref 6–23)
Creatinine, Ser: 1 mg/dL (ref 0.4–1.2)
GFR: 71.62 mL/min (ref >60.00)
Glucose, Bld: 92 mg/dL (ref 70–99)
Potassium: 4.4 mEq/L (ref 3.5–5.1)

## 2011-01-05 NOTE — Progress Notes (Signed)
HPI The patient presents for followup of her ischemic cardiomyopathy. She was previously seen by Dr. Deborah Chalk. She was in the hospital last week.  She had volume overload that responded quickly to IV lasix.  She was sent home on po lasix.  She feels well now.  She denies any PND or orthopnea.  She has not felt palpitations, presyncope or syncope.  She has had no chest pain, neck or arm pain.  She never really has weight gain or edema.  She avoids salt.  Allergies  Allergen Reactions  . Penicillins   . Statins     Current Outpatient Prescriptions  Medication Sig Dispense Refill  . amiodarone (PACERONE) 200 MG tablet Take 1 tablet (200 mg total) by mouth daily.  90 tablet  4  . carvedilol (COREG) 12.5 MG tablet Take 1 tablet (12.5 mg total) by mouth 2 (two) times daily.  180 tablet  4  . cyclobenzaprine (FLEXERIL) 5 MG tablet Take 5 mg by mouth as needed.   30 tablet  0  . ezetimibe (ZETIA) 10 MG tablet Take 1 tablet (10 mg total) by mouth daily.  30 tablet  11  . FA-Pyridoxine-Cyancobalamin (FOLTX PO) Take by mouth daily.        . fexofenadine (ALLEGRA) 180 MG tablet Take 180 mg by mouth daily.        . fish oil-omega-3 fatty acids 1000 MG capsule Take 1,000 mg by mouth daily.        . fluticasone (FLONASE) 50 MCG/ACT nasal spray Place 2 sprays into the nose as needed.        . furosemide (LASIX) 20 MG tablet Take 20 mg by mouth daily.        [name] HYDROcodone-acetaminophen (NORCO) 5-325 MG per tablet Take 1 tablet by mouth every 4 (four) hours as needed for pain.  90 tablet  3  . levothyroxine (SYNTHROID) 150 MCG tablet Take 1 tablet (150 mcg total) by mouth daily.  30 tablet  3  . lisinopril (PRINIVIL,ZESTRIL) 2.5 MG tablet Take 5 mg by mouth daily.        . methocarbamol (ROBAXIN) 500 MG tablet Take 1 tablet (500 mg total) by mouth every 6 (six) hours as needed.  90 tablet  3  . metolazone (ZAROXOLYN) 2.5 MG tablet Take 2.5 mg by mouth as needed.       . nitroGLYCERIN (NITROSTAT) 0.4 MG SL  tablet Place 1 tablet (0.4 mg total) under the tongue every 5 (five) minutes as needed for chest pain.  90 tablet  0  . Oxycodone-Acetaminophen (PERCOCET PO) Take by mouth as needed.        . Pantoprazole Sodium (PROTONIX PO) Take by mouth daily.        . potassium chloride SA (K-DUR,KLOR-CON) 20 MEQ tablet Take 20 mEq by mouth daily.        [name] warfarin (COUMADIN) 3 MG tablet As Directed per Anticoagulation Clinic  90 tablet  3    Past Medical History  Diagnosis Date  . Spinal stenosis   . Numbness of foot     left foot  . Diverticulitis     Status post partial colectomy 1/12  . Colitis, ischemic   . DJD (degenerative joint disease)     History of multiple surgeries to the back, shoulder and knee  . Ischemic cardiomyopathy     Echo 06/16/10: EF 25-30%, anteroseptal and apical hypokinesis, moderate AI, mild MR  . Chronic systolic heart failure   . CAD (coronary  artery disease)     a.  Ant MI 8/03 with stenting of the LAD;  b. staged PCI with Cypher DES to OM1 8/03;  c. s/p Cypher DES to LAD 7/04;  d.  multiple cardiac caths in past (chronically abnl ECG);    e. cardiac catheterization 3/12: LAD stent patent, distal LAD 40-50%, small ostial D1 80%, ostial D2 60%, OM-1 stent patent (20-30%), proximal RCA 50%, proximal to mid RCA 40-50%  . LV (left ventricular) mural thrombus     Chronic Coumadin therapy  . Stroke     History of TIA and possible CVA; hospitalized 2004  . CKD (chronic kidney disease), stage III     creatinine:  1.3 in 4/12;    . Tobacco abuse     history  . GERD (gastroesophageal reflux disease)   . Hypertension   . Lower GI bleed     August 2011  . SVT (supraventricular tachycardia)     ? h/o AFib;  patient on long term amiodarone therapy  . Hypothyroidism   . Aortic insufficiency   . Pseudoaneurysm     History of, right forearm  . ICD (implantable cardiac defibrillator), single, in situ     [employer];  2008  . CHF (congestive heart failure)   .  Hyperlipidemia   . OA (osteoarthritis)     Past Surgical History  Procedure Date  . Cardiac catheterization 06/2009  . Insert / replace / remove pacemaker   . Icd   . Back surgery   . Shoulder surgery   . Knee surgery   . Colon surgery   . Spine surgery     ROS:  As stated in the HPI and negative for all other systems.  PHYSICAL EXAM BP 115/68  Pulse 57  Ht 5\' 6"  (1.676 m)  Wt 147 lb (66.679 kg)  BMI 23.73 kg/m2 GENERAL:  Well appearing HEENT:  Pupils equal round and reactive, fundi not visualized, oral mucosa unremarkable NECK:  No jugular venous distention, waveform within normal limits, carotid upstroke brisk and symmetric, no bruits, no thyromegaly LYMPHATICS:  No cervical, inguinal adenopathy LUNGS:  Clear to auscultation bilaterally BACK:  No CVA tenderness CHEST:  Unremarkable, ICD HEART:  PMI not displaced or sustained,S1 and S2 within normal limits, no S3, no S4, no clicks, no rubs, no murmurs ABD:  Flat, positive bowel sounds normal in frequency in pitch, no bruits, no rebound, no guarding, no midline pulsatile mass, no hepatomegaly, no splenomegaly EXT:  2 plus pulses throughout, no edema, no cyanosis no clubbing SKIN:  No rashes no nodules NEURO:  Cranial nerves II through XII grossly intact, motor grossly intact throughout PSYCH:  Cognitively intact, oriented to person place and time   ASSESSMENT AND PLAN

## 2011-01-05 NOTE — Assessment & Plan Note (Signed)
This was stable in the hospital.  However, we will watch this closely.

## 2011-01-05 NOTE — Assessment & Plan Note (Signed)
The patient has no new sypmtoms.  No further cardiovascular testing is indicated.  We will continue with aggressive risk reduction and meds as listed.  

## 2011-01-05 NOTE — Assessment & Plan Note (Signed)
She has had no symptomatic recurrence of this.  She remains on and tolerates Coumadin.

## 2011-01-05 NOTE — Patient Instructions (Signed)
The current medical regimen is effective;  continue present plan and medications.  Follow up with Norma Fredrickson in 1 month

## 2011-01-05 NOTE — Telephone Encounter (Signed)
Called and left message for [name] of Dr Hochrein's orders.

## 2011-01-05 NOTE — Assessment & Plan Note (Signed)
Her TSH was low in the hospital.  Her Synthroid dose was recently changed to reflect this reading.

## 2011-01-05 NOTE — Assessment & Plan Note (Signed)
We will keep a close eye on her.  She will remain on the meds as listed.  I will check a BMET today.  She will return in one month for follow up.

## 2011-01-06 ENCOUNTER — Telehealth: Payer: Self-pay

## 2011-01-06 DIAGNOSIS — E039 Hypothyroidism, unspecified: Secondary | ICD-10-CM

## 2011-01-06 MED ORDER — LEVOTHYROXINE SODIUM 150 MCG PO TABS: 150.0000 ug | ORAL_TABLET | Freq: Every day | ORAL | Status: DC

## 2011-01-06 NOTE — Telephone Encounter (Signed)
refill 

## 2011-01-07 ENCOUNTER — Ambulatory Visit (INDEPENDENT_AMBULATORY_CARE_PROVIDER_SITE_OTHER): Payer: Self-pay | Admitting: Cardiology

## 2011-01-07 DIAGNOSIS — I255 Ischemic cardiomyopathy: Secondary | ICD-10-CM

## 2011-01-07 DIAGNOSIS — I2589 Other forms of chronic ischemic heart disease: Secondary | ICD-10-CM

## 2011-01-07 DIAGNOSIS — Z8679 Personal history of other diseases of the circulatory system: Secondary | ICD-10-CM

## 2011-01-07 LAB — POCT INR: INR: 2.3

## 2011-01-11 ENCOUNTER — Encounter: Payer: Medicare Other | Admitting: Internal Medicine

## 2011-01-12 NOTE — H&P (Signed)
[name], ROUGHT NO.:  [id number]  MEDICAL RECORD NO.:  [id number]  LOCATION:  4705                         FACILITY:  [facility]  PHYSICIAN:  Marca Ancona, MD      DATE OF BIRTH:  Apr 24, 1950  DATE OF ADMISSION:  12/29/2010 DATE OF DISCHARGE:                             HISTORY & PHYSICAL   PRIMARY CARE PHYSICIAN:  Dr. Sanda Linger.  HISTORY OF PRESENT ILLNESS:  This is a 60 year old with history of coronary artery disease, ischemic cardiomyopathy, paroxysmal atrial fibrillation who presents with dyspnea and chest pressure.  The patient says that every 5-7 days period she developed orthopnea/dyspnea/chest pressure and noticed that she needs to take her p.r.n. Lasix.  Today, She felt "bad" when she woke up.  She was orthopneic.  She had pressure in her chest.  She took Lasix and sublingual nitroglycerin and felt a little bit better.  A little later she has had on the edge of her bed and became diaphoretic for the rest today she noted mild chest pressure as well as dyspnea with minimal exertion.  She is short of breath after walking for about 10 feet today this is significantly worse than her baseline.  This evening the patient actually threw up so that she decided to call EMS.  She says that she feels like she has too much fluid in her.  MEDICATIONS: 1. Amiodarone 200 mg daily. 2. Zetia 10 mg daily. 3. Fish oil 1000 mg daily. 4. Levoxyl 200 mcg daily. 5. Protonix 40 mg daily with warfarin. 6. Coreg 12.5 mg b.i.d. 7. Lisinopril 2.5 mg daily. 8. Lasix p.r.n.  PAST MEDICAL HISTORY: 1. Spinal stenosis. 2. Osteoarthritis. 3. Diverticulitis test status post partial colectomy in January, 2012     she had takedown of her colostomy in August, 2012. 4. Ischemic cardiomyopathy.  Last echo was in March 2012.  EF was 25-     30% there is anterior septal and apical hypokinesis there is     moderate aortic insufficiency.  There is mild mitral regurgitation.  The patient does have a Environmental [employer] ICD she also does have a     history of a LV mural thrombus for which she has been on Coumadin. 5. Coronary artery disease.  The patient had anterior myocardial     infarction in August, 2003, with PCI to her LAD.  She had a staged     PCI with Cypher drug-eluting stent to the first obtuse marginal in     August 2003.  She had a Cypher drug-eluting stent to her LAD in     July 2004.  Her last cath was in March 2012 with a 40-50% distal     LAD stenosis.  An ostial small diagonal and 80% stenosis at 50%     proximal RCA stenosis.  Stents were patent on this catheterization     March, 2012, TIA in 2004. 6. Chronic kidney disease. 7. gastroesophageal reflux disease  with gas esophageal reflux     disease. 8. Hypertension. 9. History of lower GI bleed. 10.Paroxysmal atrial fibrillation.  The patient had multiple episodes     in August 2012.  She had ICD  shocks because of inappropriate ICD     shocks because of atrial fibrillation with rapid ventricular     response.  She is now on amiodarone to maintain sinus rhythm.  She     is on Coumadin. 11.Hypothyroidism. 12.Hyperlipidemia.  SOCIAL HISTORY:  The patient lives in [place].  She is married.  She does not smoke.  She does not drink alcohol.  FAMILY HISTORY:  Noncontributory.  REVIEW OF SYSTEMS:  All systems were reviewed and were negative except as noted in history of present illness.  PHYSICAL EXAMINATION:  VITAL SIGNS:  Temperature 99.4, pulse 74, blood pressure 150/73 initially now down to 128/97. GENERAL:  This is a well-developed female, in no apparent distress. HEENT:  Normal exam. ABDOMEN:  Soft, there is some tenderness in the right upper quadrant at the site of her recent colostomy takedown.  There is no hepatosplenomegaly. NECK:  There is no thyromegaly or thyroid nodule.  JVP is elevated at about 10-11 cm of water. CARDIOVASCULAR:  Regular S1, S2.  No S3, no S4.  There is 2  6 early systolic ejection type murmur at the right upper sternal border.  There is no peripheral edema.  There is trace posterior tibial pulses bilaterally, and there is a right carotid bruit. EXTREMITIES:  No clubbing or cyanosis. LUNGS: Slight crackles at the bases bilaterally.  NEUROLOGIC:  Alert and orient x3.  Normal affect.  SKIN:  Normal exam.  RADIOLOGY:  Chest x-ray shows mild interstitial pulmonary edema.  EKG normal sinus rhythm.  Old anterolateral MI, left anterior fascicular block.  Possible old inferior MI.  LABORATORY DATA:  White count 5.9, hematocrit 35.4, platelets 173, potassium 4.1, creatinine 0.86.  INR 4.34, troponin 0.02.  IMPRESSION:  This is a 60 year old with a history of coronary artery disease, paroxysmal atrial fibrillation, and ischemic cardiomyopathy who presents with orthopnea and increased exertional dyspnea and chest pressure. 1. Congestive heart failure.  I do suspect acute on chronic systolic     congestive heart failure.  She has pulmonary edema on her chest x-     ray.  She looks volume overloaded on exam.  She feels like she is     retaining fluid.  Usually when this happens, she says she gets     better after taking 1 dose of 40 mg oral Lasix, however, today she     has not improved.  I am going to start her on a Lasix 40 mg IV     b.i.d.  We will check a BNP.  We are going to continue her Coreg.     We are going to increase her lisinopril 5 mg daily. 2. Coronary artery disease.  The patient has an ischemic     cardiomyopathy.  She has had chest pressure fairly constantly     today.  Her initial cardiac markers are negative.  I suspect that     her symptoms may actually be due to volume overload/lung     congestion.  I am going to cycle her  cardiac enzymes while on 81     mg of aspirin. 3. Anticoagulation.  The patient has been on this for paroxysmal     atrial fibrillation.  History of LV thrombus I am going to hold her     warfarin with  elevated INR. 4. Paroxysmal atrial fibrillation.  The patient is in normal sinus     rhythm on amiodarone and will check TSH and     LFTs. 5. Carotid  bruit.  We will get carotid Dopplers which she requests     while she is in the hospital and we will set up for carotid     Dopplers as an outpatient by her primary care physician.     Marca Ancona, MD     DM/MEDQ  D:  12/29/2010  T:  12/30/2010  Job:  [id number]  Electronically Signed by Marca Ancona MD on 01/12/2011 08:17:14 PM

## 2011-01-14 ENCOUNTER — Ambulatory Visit (INDEPENDENT_AMBULATORY_CARE_PROVIDER_SITE_OTHER): Payer: Self-pay | Admitting: Internal Medicine

## 2011-01-14 DIAGNOSIS — I255 Ischemic cardiomyopathy: Secondary | ICD-10-CM

## 2011-01-14 DIAGNOSIS — R0989 Other specified symptoms and signs involving the circulatory and respiratory systems: Secondary | ICD-10-CM

## 2011-01-14 DIAGNOSIS — Z8679 Personal history of other diseases of the circulatory system: Secondary | ICD-10-CM

## 2011-01-18 NOTE — Discharge Summary (Signed)
[name] NO.:  [id number]  MEDICAL RECORD NO.:  [id number]  LOCATION:  4705                         FACILITY:  [facility]  PHYSICIAN:  Becky Rotunda, MD, FACCDATE OF BIRTH:  January 28, 1951  DATE OF ADMISSION:  12/29/2010 DATE OF DISCHARGE:  12/31/2010                              DISCHARGE SUMMARY   PRIMARY CARDIOLOGIST:  Becky Rotunda, MD, [facility]  PRIMARY CARE PROVIDER:  Sanda Linger, MD  DISCHARGE DIAGNOSES: Acute on chronic systolic congestive heart failure.  SECONDARY DIAGNOSES: 1. Ischemic cardiomyopathy with ejection fraction of 25-30%. 2. Coronary artery disease status post anterior myocardial infarction,     percutaneous coronary intervention of the left anterior descending     and obtuse marginal. 3. History of left ventricular mural thrombus on chronic Coumadin     therapy. 4. History of transient ischemic attack. 5. Hypertension. 6. History of lower gastrointestinal bleed.7. Status post ICD placement. 8. Hypothyroidism with recent dose adjustments. 9. Hyperlipidemia. 10.History of atrial fibrillation on amiodarone and Coumadin therapy.  ALLERGIES:  PENICILLIN, CLINDAMYCIN, STATINS, PERCOCET.  PROCEDURES:  None.  HISTORY OF PRESENT ILLNESS:  A 60 year old female with above complex problem list.  The patient was in her usual state of health until the morning of admission when she awoke suddenly with dyspnea and orthopnea. She took nitroglycerin and Lasix and felt a little bit better and remains as diaphoretic with mild chest pressure and dyspnea with minimal exertion throughout the day.  There has been episodes of vomiting the evening of admission and subsequently called EMS and presented to the [facility] ED.  In the emergency department, EKG showed no acute changes and she was felt to have volume overload on exam.  She was admitted for further evaluation and management.  HOSPITAL COURSE:  The patient's cardiac enzymes were negative.  BNP  was elevated on admission at 8189.  She was placed on intravenous Lasix and with just 2 doses she had significant symptomatic improvement and reduction in BNP to 2473.  Her weight was stable at 65.4 kg.  The patient was switched from IV to oral Lasix initially at 40 mg b.i.d., however, she was noted to have slight elevation of creatinine of 1.29 on this dose.  Therefore reduced it to 20 mg daily.  Of note, the patient was only taking Lasix 20 mg p.r.n. for volume excess in the outpatient setting.  [name]' INR has been supratherapeutic throughout admission and her Coumadin has been held over the past 3 weeks.  We will resume her at half of a usual dose this evening (1.5 mg) and then she will take 3 mg nightly over the next few days and we will plan for followup INR via Home Health on Monday, October 15.  The patient will be discharged home today in good condition.  DISCHARGE LABS:  Hemoglobin 12.0, hematocrit 35.6, WBC 5.0, platelets 169,000.  INR 1 to 3.71.  Sodium 137, potassium 4.3, chloride 101, CO2 26, BUN 17, creatinine 1.29, glucose 98, total bilirubin 1.0 alkaline phosphatase 67, AST 25, ALT 15, total protein 7.0, albumin 3.7, calcium 9.0, hemoglobin A1c 5.6.  CK 72, MB 2.4, troponin I less than 0.30.  BNP 2473.  Total cholesterol 193, triglycerides 77, HDL 98, LDL 80.  TSH 0.041.  DISPOSITION:  The patient will be discharged home today in good condition.  FOLLOWUP PLANS/APPOINTMENTS:  The patient has prearranged followup with Dr. Antoine [name] on October 17 at 10:30 am.  We will follow up basic metabolic panel that day.  She also has followup scheduled with Dr. Graciela [name] on October 23 at 2:15 p.m.  She has been provided a prescription for followup PT/INR via Home Health on Monday, October 15.  DISCHARGE MEDICATIONS: 1. Coumadin 3 mg 1/2 a tablet size and 1 tab daily. 2. Lasix 40 mg daily. 3. Lisinopril 5 mg daily. 4. Potassium chloride 20 mEq daily. 5. Amiodarone 200 mg  daily. 6. Aspirin 81 mg daily. 7. Carvedilol 12.5 mg b.i.d. 8. Flonase 1 spray at bedtime. 9. Folplex 1 tab daily. 10.Fexofenadine 180 mg daily. 11.Hydrocodone/APAP 5/325 mg 1-2 tabs q.4 hours p.r.n. 12.Levothyroxine 150 mcg daily. 13.Nitroglycerin 0.4 mg sublingual p.r.n. chest pain. 14.Zetia 10 mg at bedtime.  OUTSTANDING LABS AND STUDIES:  Follow up basic metabolic panel on October 17.  DURATION OF DISCHARGE ENCOUNTER:  45 minutes including physician time.    [name], ANP   ______________________________ Becky Rotunda, MD, [facility]   CB/MEDQ  D:  12/31/2010  T:  12/31/2010  Job:  [id number]  cc:   Becky Linger, MD  Electronically Signed by [name] ANP on 01/11/2011 04:17:43 PM Electronically Signed by Becky Rotunda MD [facility] on 01/18/2011 05:45:21 PM

## 2011-01-21 ENCOUNTER — Ambulatory Visit (INDEPENDENT_AMBULATORY_CARE_PROVIDER_SITE_OTHER): Payer: Self-pay | Admitting: Cardiovascular Disease

## 2011-01-21 DIAGNOSIS — R0989 Other specified symptoms and signs involving the circulatory and respiratory systems: Secondary | ICD-10-CM

## 2011-01-21 DIAGNOSIS — I255 Ischemic cardiomyopathy: Secondary | ICD-10-CM

## 2011-01-21 DIAGNOSIS — Z8679 Personal history of other diseases of the circulatory system: Secondary | ICD-10-CM

## 2011-01-24 ENCOUNTER — Encounter: Payer: Medicare Other | Admitting: *Deleted

## 2011-01-25 ENCOUNTER — Telehealth: Payer: Self-pay | Admitting: Cardiology

## 2011-01-25 NOTE — Telephone Encounter (Signed)
Per [name], calling to request an verbal order to continue physical therapy for twice a week for 2 weeks.

## 2011-01-25 NOTE — Telephone Encounter (Signed)
Verbal order given to continue PT>

## 2011-01-31 ENCOUNTER — Ambulatory Visit (INDEPENDENT_AMBULATORY_CARE_PROVIDER_SITE_OTHER): Payer: Self-pay | Admitting: Cardiology

## 2011-01-31 ENCOUNTER — Encounter: Payer: Medicare Other | Admitting: Cardiology

## 2011-01-31 DIAGNOSIS — Z8679 Personal history of other diseases of the circulatory system: Secondary | ICD-10-CM

## 2011-01-31 DIAGNOSIS — R0989 Other specified symptoms and signs involving the circulatory and respiratory systems: Secondary | ICD-10-CM

## 2011-01-31 DIAGNOSIS — I255 Ischemic cardiomyopathy: Secondary | ICD-10-CM

## 2011-01-31 LAB — POCT INR: INR: 1.2

## 2011-02-04 ENCOUNTER — Ambulatory Visit (INDEPENDENT_AMBULATORY_CARE_PROVIDER_SITE_OTHER): Payer: Self-pay | Admitting: Cardiology

## 2011-02-04 DIAGNOSIS — Z8679 Personal history of other diseases of the circulatory system: Secondary | ICD-10-CM

## 2011-02-04 DIAGNOSIS — R0989 Other specified symptoms and signs involving the circulatory and respiratory systems: Secondary | ICD-10-CM

## 2011-02-04 DIAGNOSIS — I255 Ischemic cardiomyopathy: Secondary | ICD-10-CM

## 2011-02-04 LAB — POCT INR: INR: 2

## 2011-02-09 ENCOUNTER — Ambulatory Visit (INDEPENDENT_AMBULATORY_CARE_PROVIDER_SITE_OTHER): Payer: Medicare Other | Admitting: *Deleted

## 2011-02-09 ENCOUNTER — Ambulatory Visit (INDEPENDENT_AMBULATORY_CARE_PROVIDER_SITE_OTHER): Payer: Medicare Other | Admitting: Nurse Practitioner

## 2011-02-09 ENCOUNTER — Encounter: Payer: Self-pay | Admitting: Nurse Practitioner

## 2011-02-09 VITALS — BP 128/88 | HR 64 | Ht 66.0 in | Wt 154.8 lb

## 2011-02-09 DIAGNOSIS — I2589 Other forms of chronic ischemic heart disease: Secondary | ICD-10-CM

## 2011-02-09 DIAGNOSIS — Z8679 Personal history of other diseases of the circulatory system: Secondary | ICD-10-CM

## 2011-02-09 DIAGNOSIS — I4891 Unspecified atrial fibrillation: Secondary | ICD-10-CM

## 2011-02-09 DIAGNOSIS — I255 Ischemic cardiomyopathy: Secondary | ICD-10-CM

## 2011-02-09 DIAGNOSIS — N189 Chronic kidney disease, unspecified: Secondary | ICD-10-CM

## 2011-02-09 DIAGNOSIS — Z7901 Long term (current) use of anticoagulants: Secondary | ICD-10-CM

## 2011-02-09 DIAGNOSIS — E039 Hypothyroidism, unspecified: Secondary | ICD-10-CM

## 2011-02-09 LAB — POCT INR: INR: 1.6

## 2011-02-09 NOTE — Assessment & Plan Note (Signed)
Last BUN and creatinine were normal. We will continue to follow.

## 2011-02-09 NOTE — Assessment & Plan Note (Signed)
She looks compensated. She is doing well clinically. Encouraged her to continue with her daily weights and salt restriction. She will use extra Lasix prn weight gain. We will see her back in 2 months. Patient is agreeable to this plan and will call if any problems develop in the interim.

## 2011-02-09 NOTE — Patient Instructions (Signed)
I think you are doing well.  Stay active   Watch your salt intake  Stay on your same medicines.  We will see you in 2 months with Dr. Antoine Poche.   Call the [facility] office at [phone] if you have any questions, problems or concerns.

## 2011-02-09 NOTE — Assessment & Plan Note (Signed)
Her home health will be finishing up by next week. We will check an INR today. She will continue to be followed in the coumadin clinic here.

## 2011-02-09 NOTE — Assessment & Plan Note (Signed)
She remains on amiodarone. No recurrence noted. She does remain on her coumadin.

## 2011-02-09 NOTE — Assessment & Plan Note (Signed)
Will need to have thyroid studies and other labs on return visit due to her amiodarone therapy.

## 2011-02-09 NOTE — Progress Notes (Signed)
 [name] Date of Birth: 05/10/1950 Medical Record #[id number]  History of Present Illness: [name] is seen today for a one month check. She is seen for Dr. Hochrein. She is a former patient of Dr. Tennant's. She has a very complex and extensive medical history. She has a known ischemic cardiomyopathy. Has an ICD in place. She is on amiodarone for SVT and ? Atrial fibrillation. She remains on chronic coumadin for prior stroke and past mural thrombus in the LV. She has chronic systolic heart failure. EF is 25 to 30%.   She comes in today. She is doing ok. No real complaints. She is finishing up with her home health next week. She continues to work with physical therapy and will be transitioning to the outpatient facility next week. No cardiac complaints. Denies shortness of breath, chest pain, cough or swelling. She notes that her "gut is working better" since she had her colostomy reversed. She has been able to eat more and has had just a gradual weight gain. No PND, orthopnea, etc. She is tolerating her medicines.   Current Outpatient Prescriptions on File Prior to Visit  Medication Sig Dispense Refill  . amiodarone (PACERONE) 200 MG tablet Take 1 tablet (200 mg total) by mouth daily.  90 tablet  4  . carvedilol (COREG) 12.5 MG tablet Take 1 tablet (12.5 mg total) by mouth 2 (two) times daily.  180 tablet  4  . cyclobenzaprine (FLEXERIL) 5 MG tablet Take 5 mg by mouth as needed.   30 tablet  0  . ezetimibe (ZETIA) 10 MG tablet Take 1 tablet (10 mg total) by mouth daily.  30 tablet  11  . FA-Pyridoxine-Cyancobalamin (FOLTX PO) Take by mouth daily.        . fexofenadine (ALLEGRA) 180 MG tablet Take 180 mg by mouth daily.        . fish oil-omega-3 fatty acids 1000 MG capsule Take 1,000 mg by mouth daily.        . fluticasone (FLONASE) 50 MCG/ACT nasal spray Place 2 sprays into the nose as needed.        . furosemide (LASIX) 20 MG tablet Take 20 mg by mouth daily.        .  HYDROcodone-acetaminophen (NORCO) 5-325 MG per tablet Take 1 tablet by mouth every 4 (four) hours as needed for pain.  90 tablet  3  . levothyroxine (SYNTHROID) 150 MCG tablet Take 1 tablet (150 mcg total) by mouth daily.  90 tablet  3  . lisinopril (PRINIVIL,ZESTRIL) 2.5 MG tablet Take 5 mg by mouth daily.        . metolazone (ZAROXOLYN) 2.5 MG tablet Take 2.5 mg by mouth as needed.       . nitroGLYCERIN (NITROSTAT) 0.4 MG SL tablet Place 1 tablet (0.4 mg total) under the tongue every 5 (five) minutes as needed for chest pain.  90 tablet  0  . Oxycodone-Acetaminophen (PERCOCET PO) Take by mouth as needed.        . Pantoprazole Sodium (PROTONIX PO) Take by mouth daily.        . potassium chloride SA (K-DUR,KLOR-CON) 20 MEQ tablet Take 20 mEq by mouth daily.        . warfarin (COUMADIN) 3 MG tablet As Directed per Anticoagulation Clinic  90 tablet  3  . methocarbamol (ROBAXIN) 500 MG tablet Take 1 tablet (500 mg total) by mouth every 6 (six) hours as needed.  90 tablet  3    Allergies    Allergen Reactions  . Penicillins   . Statins     Past Medical History  Diagnosis Date  . Spinal stenosis   . Numbness of foot     left foot  . Diverticulitis     Status post partial colectomy 1/12  . Colitis, ischemic   . DJD (degenerative joint disease)     History of multiple surgeries to the back, shoulder and knee  . Ischemic cardiomyopathy     Echo 06/16/10: EF 25-30%, anteroseptal and apical hypokinesis, moderate AI, mild MR  . Chronic systolic heart failure   . CAD (coronary artery disease)     a.  Ant MI 8/03 with stenting of the LAD;  b. staged PCI with Cypher DES to OM1 8/03;  c. s/p Cypher DES to LAD 7/04;  d.  multiple cardiac caths in past (chronically abnl ECG);    e. cardiac catheterization 3/12: LAD stent patent, distal LAD 40-50%, small ostial D1 80%, ostial D2 60%, OM-1 stent patent (20-30%), proximal RCA 50%, proximal to mid RCA 40-50%  . LV (left ventricular) mural thrombus      Chronic Coumadin therapy  . Stroke     History of TIA and possible CVA; hospitalized 2004  . CKD (chronic kidney disease), stage III     creatinine:  1.3 in 4/12;    . Tobacco abuse     history  . GERD (gastroesophageal reflux disease)   . Hypertension   . Lower GI bleed     August 2011  . SVT (supraventricular tachycardia)     ? h/o AFib;  patient on long term amiodarone therapy  . Hypothyroidism   . Aortic insufficiency   . Pseudoaneurysm     History of, right forearm  . ICD (implantable cardiac defibrillator), single, in situ     [employer];  2008  . CHF (congestive heart failure)   . Hyperlipidemia   . OA (osteoarthritis)     Past Surgical History  Procedure Date  . Cardiac catheterization 06/2009  . Insert / replace / remove pacemaker   . Icd   . Back surgery   . Shoulder surgery   . Knee surgery   . Colon surgery   . Spine surgery     History  Smoking status  . Former Smoker  . Quit date: 03/22/1995  Smokeless tobacco  . Not on file  Comment: quit in 1992    History  Alcohol Use No    Family History  Problem Relation Age of Onset  . Diabetes Mother   . Diabetes Brother   . Arthritis      family history  . Prostate cancer      Family History    Review of Systems: The review of systems is positive for diffuse joint pains.  All other systems were reviewed and are negative.  Physical Exam: Ht 5' 6" (1.676 m)  Wt 154 lb 12.8 oz (70.217 kg)  BMI 24.99 kg/m2 Patient is alert and in no acute distress. Skin is warm and dry. Color is normal.  HEENT is unremarkable. Normocephalic/atraumatic. PERRL. Sclera are nonicteric. Neck is supple. No masses. No JVD. Lungs are clear. Cardiac exam shows a regular rate and rhythm. No S3. Abdomen is soft. Extremities are without edema. Gait and ROM are intact. No gross neurologic deficits noted.   Lab Results  Component Value Date   WBC 5.0 12/30/2010   HGB 12.0 12/30/2010   HCT 35.6* 12/30/2010   PLT 159  12/30/2010     GLUCOSE 92 01/05/2011   CHOL 193 12/30/2010   TRIG 77 12/30/2010   HDL 98 12/30/2010   LDLCALC 80 12/30/2010   ALT 15 12/30/2010   AST 25 12/30/2010   NA 138 01/05/2011   K 4.4 01/05/2011   CL 105 01/05/2011   CREATININE 1.0 01/05/2011   BUN 20 01/05/2011   CO2 25 01/05/2011   TSH 0.041* 12/30/2010   INR 1.6 02/09/2011   HGBA1C 5.6 12/30/2010      Assessment / Plan:  

## 2011-02-15 ENCOUNTER — Encounter: Payer: Medicare Other | Admitting: Internal Medicine

## 2011-02-16 ENCOUNTER — Ambulatory Visit (INDEPENDENT_AMBULATORY_CARE_PROVIDER_SITE_OTHER): Payer: Self-pay | Admitting: Cardiology

## 2011-02-16 DIAGNOSIS — I255 Ischemic cardiomyopathy: Secondary | ICD-10-CM

## 2011-02-16 DIAGNOSIS — Z8679 Personal history of other diseases of the circulatory system: Secondary | ICD-10-CM

## 2011-02-16 DIAGNOSIS — R0989 Other specified symptoms and signs involving the circulatory and respiratory systems: Secondary | ICD-10-CM

## 2011-02-16 LAB — POCT INR: INR: 2.6

## 2011-02-17 ENCOUNTER — Encounter (HOSPITAL_COMMUNITY): Payer: Self-pay | Admitting: Emergency Medicine

## 2011-02-17 ENCOUNTER — Encounter (HOSPITAL_COMMUNITY)
Admission: EM | Disposition: A | Discharge status: Home / Self Care | Payer: Self-pay | Source: Home / Self Care | Attending: Cardiology

## 2011-02-17 ENCOUNTER — Inpatient Hospital Stay (HOSPITAL_COMMUNITY)
Admission: EM | Admit: 2011-02-17 | Discharge: 2011-02-18 | DRG: 287 | Disposition: A | Discharge status: Home / Self Care | Payer: Medicare Other | Attending: Cardiology | Admitting: Cardiology

## 2011-02-17 DIAGNOSIS — I4891 Unspecified atrial fibrillation: Secondary | ICD-10-CM | POA: Diagnosis present

## 2011-02-17 DIAGNOSIS — I5022 Chronic systolic (congestive) heart failure: Secondary | ICD-10-CM | POA: Diagnosis present

## 2011-02-17 DIAGNOSIS — J329 Chronic sinusitis, unspecified: Secondary | ICD-10-CM | POA: Diagnosis present

## 2011-02-17 DIAGNOSIS — I200 Unstable angina: Secondary | ICD-10-CM | POA: Diagnosis present

## 2011-02-17 DIAGNOSIS — I255 Ischemic cardiomyopathy: Secondary | ICD-10-CM

## 2011-02-17 DIAGNOSIS — N183 Chronic kidney disease, stage 3 unspecified: Secondary | ICD-10-CM | POA: Diagnosis present

## 2011-02-17 DIAGNOSIS — I2510 Atherosclerotic heart disease of native coronary artery without angina pectoris: Secondary | ICD-10-CM

## 2011-02-17 DIAGNOSIS — I129 Hypertensive chronic kidney disease with stage 1 through stage 4 chronic kidney disease, or unspecified chronic kidney disease: Secondary | ICD-10-CM | POA: Diagnosis present

## 2011-02-17 DIAGNOSIS — Z9861 Coronary angioplasty status: Secondary | ICD-10-CM

## 2011-02-17 DIAGNOSIS — Z8673 Personal history of transient ischemic attack (TIA), and cerebral infarction without residual deficits: Secondary | ICD-10-CM

## 2011-02-17 DIAGNOSIS — Z7901 Long term (current) use of anticoagulants: Secondary | ICD-10-CM

## 2011-02-17 DIAGNOSIS — K559 Vascular disorder of intestine, unspecified: Secondary | ICD-10-CM

## 2011-02-17 DIAGNOSIS — E039 Hypothyroidism, unspecified: Secondary | ICD-10-CM | POA: Diagnosis present

## 2011-02-17 DIAGNOSIS — R200 Anesthesia of skin: Secondary | ICD-10-CM

## 2011-02-17 DIAGNOSIS — M4800 Spinal stenosis, site unspecified: Secondary | ICD-10-CM

## 2011-02-17 DIAGNOSIS — I498 Other specified cardiac arrhythmias: Secondary | ICD-10-CM | POA: Diagnosis present

## 2011-02-17 DIAGNOSIS — D649 Anemia, unspecified: Secondary | ICD-10-CM

## 2011-02-17 DIAGNOSIS — K219 Gastro-esophageal reflux disease without esophagitis: Secondary | ICD-10-CM | POA: Diagnosis present

## 2011-02-17 DIAGNOSIS — I5189 Other ill-defined heart diseases: Secondary | ICD-10-CM | POA: Diagnosis present

## 2011-02-17 DIAGNOSIS — R0789 Other chest pain: Principal | ICD-10-CM | POA: Diagnosis present

## 2011-02-17 DIAGNOSIS — I509 Heart failure, unspecified: Secondary | ICD-10-CM | POA: Diagnosis present

## 2011-02-17 DIAGNOSIS — M1990 Unspecified osteoarthritis, unspecified site: Secondary | ICD-10-CM | POA: Diagnosis present

## 2011-02-17 DIAGNOSIS — Z79899 Other long term (current) drug therapy: Secondary | ICD-10-CM

## 2011-02-17 DIAGNOSIS — F17200 Nicotine dependence, unspecified, uncomplicated: Secondary | ICD-10-CM | POA: Diagnosis present

## 2011-02-17 DIAGNOSIS — I10 Essential (primary) hypertension: Secondary | ICD-10-CM | POA: Diagnosis present

## 2011-02-17 DIAGNOSIS — I513 Intracardiac thrombosis, not elsewhere classified: Secondary | ICD-10-CM

## 2011-02-17 DIAGNOSIS — E785 Hyperlipidemia, unspecified: Secondary | ICD-10-CM | POA: Diagnosis present

## 2011-02-17 DIAGNOSIS — Z95810 Presence of automatic (implantable) cardiac defibrillator: Secondary | ICD-10-CM

## 2011-02-17 DIAGNOSIS — I213 ST elevation (STEMI) myocardial infarction of unspecified site: Secondary | ICD-10-CM

## 2011-02-17 DIAGNOSIS — I639 Cerebral infarction, unspecified: Secondary | ICD-10-CM

## 2011-02-17 DIAGNOSIS — Z720 Tobacco use: Secondary | ICD-10-CM

## 2011-02-17 DIAGNOSIS — N189 Chronic kidney disease, unspecified: Secondary | ICD-10-CM

## 2011-02-17 DIAGNOSIS — R079 Chest pain, unspecified: Secondary | ICD-10-CM

## 2011-02-17 DIAGNOSIS — I359 Nonrheumatic aortic valve disorder, unspecified: Secondary | ICD-10-CM | POA: Diagnosis present

## 2011-02-17 DIAGNOSIS — I2589 Other forms of chronic ischemic heart disease: Secondary | ICD-10-CM | POA: Diagnosis present

## 2011-02-17 DIAGNOSIS — I252 Old myocardial infarction: Secondary | ICD-10-CM

## 2011-02-17 HISTORY — PX: LEFT HEART CATHETERIZATION WITH CORONARY ANGIOGRAM: SHX5451

## 2011-02-17 LAB — CARDIAC PANEL(CRET KIN+CKTOT+MB+TROPI)
CK, MB: 3.1 ng/mL (ref 0.3–4.0)
Relative Index: INVALID (ref 0.0–2.5)
Total CK: 85 U/L (ref 7–177)
Troponin I: <0.3 ng/mL (ref <0.30)

## 2011-02-17 LAB — POCT I-STAT, CHEM 8
BUN: 19 mg/dL (ref 6–23)
Chloride: 109 mEq/L (ref 96–112)
HCT: 42 % (ref 36.0–46.0)

## 2011-02-17 LAB — COMPREHENSIVE METABOLIC PANEL
ALT: 19 U/L (ref 0–35)
AST: 37 U/L (ref 0–37)
Albumin: 3.5 g/dL (ref 3.5–5.2)
Albumin: 3.4 g/dL — ABNORMAL LOW (ref 3.5–5.2)
Alkaline Phosphatase: 68 U/L (ref 39–117)
Alkaline Phosphatase: 63 U/L (ref 39–117)
BUN: 18 mg/dL (ref 6–23)
BUN: 19 mg/dL (ref 6–23)
Chloride: 105 mEq/L (ref 96–112)
Chloride: 102 mEq/L (ref 96–112)
Creatinine, Ser: 1.03 mg/dL (ref 0.50–1.10)
GFR calc Af Amer: 67 mL/min — ABNORMAL LOW (ref >90)
GFR calc non Af Amer: 57 mL/min — ABNORMAL LOW (ref >90)
Glucose, Bld: 154 mg/dL — ABNORMAL HIGH (ref 70–99)
Potassium: 3.9 mEq/L (ref 3.5–5.1)
Potassium: 4.9 mEq/L (ref 3.5–5.1)
Sodium: 138 mEq/L (ref 135–145)
Total Bilirubin: 0.5 mg/dL (ref 0.3–1.2)
Total Bilirubin: 0.6 mg/dL (ref 0.3–1.2)
Total Protein: 6.5 g/dL (ref 6.0–8.3)

## 2011-02-17 LAB — TSH: TSH: 0.493 u[IU]/mL (ref 0.350–4.500)

## 2011-02-17 LAB — DIFFERENTIAL
Basophils Absolute: 0 10*3/uL (ref 0.0–0.1)
Basophils Relative: 0 % (ref 0–1)
Basophils Relative: 0 % (ref 0–1)
Monocytes Relative: 15 % — ABNORMAL HIGH (ref 3–12)
Neutro Abs: 5.4 10*3/uL (ref 1.7–7.7)
Neutro Abs: 3.5 10*3/uL (ref 1.7–7.7)
Neutrophils Relative %: 78 % — ABNORMAL HIGH (ref 43–77)
Neutrophils Relative %: 55 % (ref 43–77)

## 2011-02-17 LAB — CBC
Hemoglobin: 12.6 g/dL (ref 12.0–15.0)
Hemoglobin: 12 g/dL (ref 12.0–15.0)
MCHC: 34.1 g/dL (ref 30.0–36.0)
MCHC: 33.4 g/dL (ref 30.0–36.0)
Platelets: 173 10*3/uL (ref 150–400)
Platelets: 143 10*3/uL — ABNORMAL LOW (ref 150–400)
RBC: 3.55 MIL/uL — ABNORMAL LOW (ref 3.87–5.11)
RDW: 16.6 % — ABNORMAL HIGH (ref 11.5–15.5)

## 2011-02-17 LAB — LIPID PANEL
LDL Cholesterol: 118 mg/dL — ABNORMAL HIGH (ref 0–99)
VLDL: 10 mg/dL (ref 0–40)

## 2011-02-17 LAB — PROTIME-INR
INR: 3.17 — ABNORMAL HIGH (ref 0.00–1.49)
Prothrombin Time: 33 seconds — ABNORMAL HIGH (ref 11.6–15.2)

## 2011-02-17 LAB — HEMOGLOBIN A1C: Hgb A1c MFr Bld: 5.1 % (ref <5.7)

## 2011-02-17 LAB — MRSA PCR SCREENING: MRSA by PCR: NEGATIVE

## 2011-02-17 LAB — D-DIMER, QUANTITATIVE: D-Dimer, Quant: 0.59 ug/mL-FEU — ABNORMAL HIGH (ref 0.00–0.48)

## 2011-02-17 LAB — APTT: aPTT: >200 seconds (ref 24–37)

## 2011-02-17 SURGERY — LEFT HEART CATHETERIZATION WITH CORONARY ANGIOGRAM
Anesthesia: LOCAL

## 2011-02-17 MED ORDER — ACETAMINOPHEN 325 MG PO TABS
ORAL_TABLET | ORAL | Status: AC
  Filled 2011-02-17: qty 2

## 2011-02-17 MED ORDER — ONDANSETRON HCL 4 MG/2ML IJ SOLN: 4.0000 mg | Freq: Four times a day (QID) | INTRAMUSCULAR | Status: DC | PRN

## 2011-02-17 MED ORDER — ACETAMINOPHEN 325 MG PO TABS
650.0000 mg | ORAL_TABLET | ORAL | Status: DC | PRN
  Administered 2011-02-17: 650 mg via ORAL

## 2011-02-17 MED ORDER — SODIUM CHLORIDE 0.9 % IV SOLN: 250.0000 mL | INTRAVENOUS | Status: DC | PRN

## 2011-02-17 MED ORDER — FENTANYL CITRATE 0.05 MG/ML IJ SOLN
INTRAMUSCULAR | Status: AC
  Filled 2011-02-17: qty 2

## 2011-02-17 MED ORDER — HYDROCODONE-ACETAMINOPHEN 5-325 MG PO TABS
1.0000 | ORAL_TABLET | ORAL | Status: DC | PRN
  Administered 2011-02-17: 1 via ORAL
  Filled 2011-02-17: qty 1

## 2011-02-17 MED ORDER — HEPARIN (PORCINE) IN NACL 2-0.9 UNIT/ML-% IJ SOLN
INTRAMUSCULAR | Status: AC
  Filled 2011-02-17: qty 2000

## 2011-02-17 MED ORDER — POTASSIUM CHLORIDE CRYS ER 20 MEQ PO TBCR
20.0000 meq | EXTENDED_RELEASE_TABLET | Freq: Every day | ORAL | Status: DC
  Administered 2011-02-18: 20 meq via ORAL
  Filled 2011-02-17: qty 1

## 2011-02-17 MED ORDER — METHOCARBAMOL 500 MG PO TABS
500.0000 mg | ORAL_TABLET | Freq: Four times a day (QID) | ORAL | Status: DC | PRN
  Filled 2011-02-17: qty 1

## 2011-02-17 MED ORDER — LEVOTHYROXINE SODIUM 150 MCG PO TABS
150.0000 ug | ORAL_TABLET | Freq: Every day | ORAL | Status: DC
  Administered 2011-02-18: 150 ug via ORAL
  Filled 2011-02-17: qty 1

## 2011-02-17 MED ORDER — LORATADINE 10 MG PO TABS
10.0000 mg | ORAL_TABLET | Freq: Every day | ORAL | Status: DC
  Administered 2011-02-17 – 2011-02-18 (×2): 10 mg via ORAL
  Filled 2011-02-17 (×2): qty 1

## 2011-02-17 MED ORDER — WARFARIN VIDEO
Freq: Once | Status: AC
  Administered 2011-02-18: 12:00:00

## 2011-02-17 MED ORDER — CARVEDILOL 12.5 MG PO TABS
12.5000 mg | ORAL_TABLET | Freq: Two times a day (BID) | ORAL | Status: DC
  Administered 2011-02-17 – 2011-02-18 (×2): 12.5 mg via ORAL
  Filled 2011-02-17 (×3): qty 1

## 2011-02-17 MED ORDER — HEPARIN BOLUS VIA INFUSION
4000.0000 [IU] | Freq: Once | INTRAVENOUS | Status: AC
  Administered 2011-02-17: 4000 [IU] via INTRAVENOUS

## 2011-02-17 MED ORDER — BIVALIRUDIN 250 MG IV SOLR
INTRAVENOUS | Status: AC
  Filled 2011-02-17: qty 250

## 2011-02-17 MED ORDER — OMEGA-3-ACID ETHYL ESTERS 1 G PO CAPS
1.0000 g | ORAL_CAPSULE | Freq: Every day | ORAL | Status: DC
  Administered 2011-02-17 – 2011-02-18 (×2): 1 g via ORAL
  Filled 2011-02-17 (×2): qty 1

## 2011-02-17 MED ORDER — LIDOCAINE HCL (PF) 1 % IJ SOLN
INTRAMUSCULAR | Status: AC
  Filled 2011-02-17: qty 30

## 2011-02-17 MED ORDER — ASPIRIN 81 MG PO CHEW: 324.0000 mg | CHEWABLE_TABLET | ORAL | Status: DC

## 2011-02-17 MED ORDER — NITROGLYCERIN 0.4 MG SL SUBL: 0.4000 mg | SUBLINGUAL_TABLET | SUBLINGUAL | Status: DC | PRN

## 2011-02-17 MED ORDER — ACETAMINOPHEN 325 MG PO TABS: 650.0000 mg | ORAL_TABLET | ORAL | Status: DC | PRN

## 2011-02-17 MED ORDER — ONDANSETRON HCL 4 MG/2ML IJ SOLN
INTRAMUSCULAR | Status: AC
  Filled 2011-02-17: qty 2

## 2011-02-17 MED ORDER — EZETIMIBE 10 MG PO TABS
10.0000 mg | ORAL_TABLET | Freq: Every day | ORAL | Status: DC
  Administered 2011-02-17 – 2011-02-18 (×2): 10 mg via ORAL
  Filled 2011-02-17 (×2): qty 1

## 2011-02-17 MED ORDER — OMEGA-3 FATTY ACIDS 1000 MG PO CAPS: 1000.0000 mg | ORAL_CAPSULE | Freq: Every day | ORAL | Status: DC

## 2011-02-17 MED ORDER — PATIENT'S GUIDE TO USING COUMADIN BOOK
Freq: Once | Status: AC
  Administered 2011-02-17: 21:00:00
  Filled 2011-02-17: qty 1

## 2011-02-17 MED ORDER — HEPARIN SODIUM (PORCINE) 1000 UNIT/ML IJ SOLN
INTRAMUSCULAR | Status: AC
  Filled 2011-02-17: qty 1

## 2011-02-17 MED ORDER — VERAPAMIL HCL 2.5 MG/ML IV SOLN
INTRAVENOUS | Status: AC
  Filled 2011-02-17: qty 2

## 2011-02-17 MED ORDER — LISINOPRIL 5 MG PO TABS
5.0000 mg | ORAL_TABLET | Freq: Every day | ORAL | Status: DC
  Administered 2011-02-18: 5 mg via ORAL
  Filled 2011-02-17: qty 1

## 2011-02-17 MED ORDER — FUROSEMIDE 20 MG PO TABS
20.0000 mg | ORAL_TABLET | Freq: Every day | ORAL | Status: DC
  Administered 2011-02-18: 20 mg via ORAL
  Filled 2011-02-17 (×2): qty 1

## 2011-02-17 MED ORDER — FLUTICASONE PROPIONATE 50 MCG/ACT NA SUSP
2.0000 | Freq: Two times a day (BID) | NASAL | Status: DC | PRN
  Filled 2011-02-17: qty 16

## 2011-02-17 MED ORDER — PANTOPRAZOLE SODIUM 40 MG PO TBEC
40.0000 mg | DELAYED_RELEASE_TABLET | Freq: Every day | ORAL | Status: DC
  Administered 2011-02-18: 40 mg via ORAL
  Filled 2011-02-17 (×2): qty 1

## 2011-02-17 MED ORDER — ASPIRIN 300 MG RE SUPP: 300.0000 mg | RECTAL | Status: DC

## 2011-02-17 MED ORDER — MIDAZOLAM HCL 2 MG/2ML IJ SOLN
INTRAMUSCULAR | Status: AC
  Filled 2011-02-17: qty 2

## 2011-02-17 MED ORDER — SODIUM CHLORIDE 0.9 % IJ SOLN
3.0000 mL | Freq: Two times a day (BID) | INTRAMUSCULAR | Status: DC
  Administered 2011-02-17: 3 mL via INTRAVENOUS

## 2011-02-17 MED ORDER — SODIUM CHLORIDE 0.9 % IJ SOLN: 3.0000 mL | INTRAMUSCULAR | Status: DC | PRN

## 2011-02-17 MED ORDER — OXYCODONE-ACETAMINOPHEN 5-325 MG PO TABS: 1.0000 | ORAL_TABLET | ORAL | Status: DC | PRN

## 2011-02-17 MED ORDER — AMIODARONE HCL 200 MG PO TABS
200.0000 mg | ORAL_TABLET | Freq: Every day | ORAL | Status: DC
  Administered 2011-02-17 – 2011-02-18 (×2): 200 mg via ORAL
  Filled 2011-02-17 (×4): qty 1

## 2011-02-17 MED ORDER — ASPIRIN EC 81 MG PO TBEC
81.0000 mg | DELAYED_RELEASE_TABLET | Freq: Every day | ORAL | Status: DC
  Administered 2011-02-18: 81 mg via ORAL
  Filled 2011-02-17: qty 1

## 2011-02-17 MED ORDER — SODIUM CHLORIDE 0.9 % IV SOLN: INTRAVENOUS | Status: AC

## 2011-02-17 MED ORDER — CYCLOBENZAPRINE HCL 5 MG PO TABS
5.0000 mg | ORAL_TABLET | Freq: Three times a day (TID) | ORAL | Status: DC | PRN
  Filled 2011-02-17: qty 1

## 2011-02-17 NOTE — H&P (View-Only) (Signed)
[name] Date of Birth: 06-16-1950 Medical Record #[id number]  History of Present Illness: [name] is seen today for a one month check. She is seen for Dr. Antoine Poche. She is a former patient of Dr. Ronnald Nian. She has a very complex and extensive medical history. She has a known ischemic cardiomyopathy. Has an ICD in place. She is on amiodarone for SVT and ? Atrial fibrillation. She remains on chronic coumadin for prior stroke and past mural thrombus in the LV. She has chronic systolic heart failure. EF is 25 to 30%.   She comes in today. She is doing ok. No real complaints. She is finishing up with her home health next week. She continues to work with physical therapy and will be transitioning to the outpatient facility next week. No cardiac complaints. Denies shortness of breath, chest pain, cough or swelling. She notes that her "gut is working better" since she had her colostomy reversed. She has been able to eat more and has had just a gradual weight gain. No PND, orthopnea, etc. She is tolerating her medicines.   Current Outpatient Prescriptions on File Prior to Visit  Medication Sig Dispense Refill  . amiodarone (PACERONE) 200 MG tablet Take 1 tablet (200 mg total) by mouth daily.  90 tablet  4  . carvedilol (COREG) 12.5 MG tablet Take 1 tablet (12.5 mg total) by mouth 2 (two) times daily.  180 tablet  4  . cyclobenzaprine (FLEXERIL) 5 MG tablet Take 5 mg by mouth as needed.   30 tablet  0  . ezetimibe (ZETIA) 10 MG tablet Take 1 tablet (10 mg total) by mouth daily.  30 tablet  11  . FA-Pyridoxine-Cyancobalamin (FOLTX PO) Take by mouth daily.        . fexofenadine (ALLEGRA) 180 MG tablet Take 180 mg by mouth daily.        . fish oil-omega-3 fatty acids 1000 MG capsule Take 1,000 mg by mouth daily.        . fluticasone (FLONASE) 50 MCG/ACT nasal spray Place 2 sprays into the nose as needed.        . furosemide (LASIX) 20 MG tablet Take 20 mg by mouth daily.        [name]  HYDROcodone-acetaminophen (NORCO) 5-325 MG per tablet Take 1 tablet by mouth every 4 (four) hours as needed for pain.  90 tablet  3  . levothyroxine (SYNTHROID) 150 MCG tablet Take 1 tablet (150 mcg total) by mouth daily.  90 tablet  3  . lisinopril (PRINIVIL,ZESTRIL) 2.5 MG tablet Take 5 mg by mouth daily.        . metolazone (ZAROXOLYN) 2.5 MG tablet Take 2.5 mg by mouth as needed.       . nitroGLYCERIN (NITROSTAT) 0.4 MG SL tablet Place 1 tablet (0.4 mg total) under the tongue every 5 (five) minutes as needed for chest pain.  90 tablet  0  . Oxycodone-Acetaminophen (PERCOCET PO) Take by mouth as needed.        . Pantoprazole Sodium (PROTONIX PO) Take by mouth daily.        . potassium chloride SA (K-DUR,KLOR-CON) 20 MEQ tablet Take 20 mEq by mouth daily.        [name] warfarin (COUMADIN) 3 MG tablet As Directed per Anticoagulation Clinic  90 tablet  3  . methocarbamol (ROBAXIN) 500 MG tablet Take 1 tablet (500 mg total) by mouth every 6 (six) hours as needed.  90 tablet  3    Allergies  Allergen Reactions  . Penicillins   . Statins     Past Medical History  Diagnosis Date  . Spinal stenosis   . Numbness of foot     left foot  . Diverticulitis     Status post partial colectomy 1/12  . Colitis, ischemic   . DJD (degenerative joint disease)     History of multiple surgeries to the back, shoulder and knee  . Ischemic cardiomyopathy     Echo 06/16/10: EF 25-30%, anteroseptal and apical hypokinesis, moderate AI, mild MR  . Chronic systolic heart failure   . CAD (coronary artery disease)     a.  Ant MI 8/03 with stenting of the LAD;  b. staged PCI with Cypher DES to OM1 8/03;  c. s/p Cypher DES to LAD 7/04;  d.  multiple cardiac caths in past (chronically abnl ECG);    e. cardiac catheterization 3/12: LAD stent patent, distal LAD 40-50%, small ostial D1 80%, ostial D2 60%, OM-1 stent patent (20-30%), proximal RCA 50%, proximal to mid RCA 40-50%  . LV (left ventricular) mural thrombus      Chronic Coumadin therapy  . Stroke     History of TIA and possible CVA; hospitalized 2004  . CKD (chronic kidney disease), stage III     creatinine:  1.3 in 4/12;    . Tobacco abuse     history  . GERD (gastroesophageal reflux disease)   . Hypertension   . Lower GI bleed     August 2011  . SVT (supraventricular tachycardia)     ? h/o AFib;  patient on long term amiodarone therapy  . Hypothyroidism   . Aortic insufficiency   . Pseudoaneurysm     History of, right forearm  . ICD (implantable cardiac defibrillator), single, in situ     [employer];  2008  . CHF (congestive heart failure)   . Hyperlipidemia   . OA (osteoarthritis)     Past Surgical History  Procedure Date  . Cardiac catheterization 06/2009  . Insert / replace / remove pacemaker   . Icd   . Back surgery   . Shoulder surgery   . Knee surgery   . Colon surgery   . Spine surgery     History  Smoking status  . Former Smoker  . Quit date: 03/22/1995  Smokeless tobacco  . Not on file  Comment: quit in 1992    History  Alcohol Use No    Family History  Problem Relation Age of Onset  . Diabetes Mother   . Diabetes Brother   . Arthritis      family history  . Prostate cancer      Family History    Review of Systems: The review of systems is positive for diffuse joint pains.  All other systems were reviewed and are negative.  Physical Exam: Ht 5\' 6"  (1.676 m)  Wt 154 lb 12.8 oz (70.217 kg)  BMI 24.99 kg/m2 Patient is alert and in no acute distress. Skin is warm and dry. Color is normal.  HEENT is unremarkable. Normocephalic/atraumatic. PERRL. Sclera are nonicteric. Neck is supple. No masses. No JVD. Lungs are clear. Cardiac exam shows a regular rate and rhythm. No S3. Abdomen is soft. Extremities are without edema. Gait and ROM are intact. No gross neurologic deficits noted.   Lab Results  Component Value Date   WBC 5.0 12/30/2010   HGB 12.0 12/30/2010   HCT 35.6* 12/30/2010   PLT 159  12/30/2010  GLUCOSE 92 01/05/2011   CHOL 193 12/30/2010   TRIG 77 12/30/2010   HDL 98 12/30/2010   LDLCALC 80 12/30/2010   ALT 15 12/30/2010   AST 25 12/30/2010   NA 138 01/05/2011   K 4.4 01/05/2011   CL 105 01/05/2011   CREATININE 1.0 01/05/2011   BUN 20 01/05/2011   CO2 25 01/05/2011   TSH 0.041* 12/30/2010   INR 1.6 02/09/2011   HGBA1C 5.6 12/30/2010      Assessment / Plan:

## 2011-02-17 NOTE — ED Provider Notes (Signed)
History     CSN: [id number] Arrival date & time: 02/17/2011  9:43 AM   First MD Initiated Contact with Patient 02/17/11 [phone]      Chief Complaint  Patient presents with  . Chest Pain    chest pain since 0700 this morning and rates 4/10 at present. alert and oriented    (Consider location/radiation/quality/duration/timing/severity/associated sxs/prior treatment) Patient is a 60 y.o. female presenting with chest pain. The history is provided by the patient.  Chest Pain The chest pain began 3 - 5 hours ago (onset ~0700). Duration of episode(s) is 3 hours. Chest pain occurs constantly. The chest pain is unchanged. At its most intense, the pain is at 6/10. The pain is currently at 4/10. The quality of the pain is described as similar to previous episodes (indigestion). The pain does not radiate. Primary symptoms include shortness of breath, abdominal pain (chronic; recent colostomy reversal), nausea and vomiting. Pertinent negatives for primary symptoms include no fever, no cough, no palpitations and no altered mental status.  Associated symptoms include diaphoresis.  Pertinent negatives for associated symptoms include no lower extremity edema, no near-syncope, no orthopnea, no paroxysmal nocturnal dyspnea and no weakness. She tried aspirin and nitroglycerin for the symptoms. Risk factors include being elderly and post-menopausal.  Her past medical history is significant for CAD, CHF, MI and pacemaker.  Procedure history is positive for cardiac catheterization.    Chest Pain  The current episode started 3 - 5 hours ago (onset ~0700). The problem occurs constantly. The problem has been unchanged. The quality of the pain is described as similar to previous episodes (indigestion). The pain does not radiate. Associated symptoms include abdominal pain (chronic; recent colostomy reversal), diaphoresis, nausea, shortness of breath and vomiting. Pertinent negatives include no back pain, cough, fever,  headaches, lower extremity edema, near-syncope, orthopnea, palpitations or weakness. She has tried aspirin and nitroglycerin for the symptoms. Risk factors include being elderly and post-menopausal.  Her past medical history is significant for CAD, CHF, MI and pacemaker.    Past Medical History  Diagnosis Date  . Spinal stenosis   . Numbness of foot     left foot  . Diverticulitis     Status post partial colectomy 1/12 with reversal in July 2012  . Colitis, ischemic   . DJD (degenerative joint disease)     History of multiple surgeries to the back, shoulder and knee  . Ischemic cardiomyopathy     Echo 06/16/10: EF 25-30%, anteroseptal and apical hypokinesis, moderate AI, mild MR  . Chronic systolic heart failure   . CAD (coronary artery disease)     a.  Ant MI 8/03 with stenting of the LAD;  b. staged PCI with Cypher DES to OM1 8/03;  c. s/p Cypher DES to LAD 7/04;  d.  multiple cardiac caths in past (chronically abnl ECG);    e. cardiac catheterization 3/12: LAD stent patent, distal LAD 40-50%, small ostial D1 80%, ostial D2 60%, OM-1 stent patent (20-30%), proximal RCA 50%, proximal to mid RCA 40-50%  . LV (left ventricular) mural thrombus     Chronic Coumadin therapy  . Stroke     History of TIA and possible CVA; hospitalized 2004; on coumadin  . CKD (chronic kidney disease), stage III     creatinine:  1.3 in 4/12;    . Tobacco abuse     history  . GERD (gastroesophageal reflux disease)   . Hypertension   . Lower GI bleed     August  2011  . SVT (supraventricular tachycardia)     ? h/o AFib;  patient on long term amiodarone therapy  . Hypothyroidism   . Aortic insufficiency   . Pseudoaneurysm     History of, right forearm  . ICD (implantable cardiac defibrillator), single, in situ     [employer];  2008; followed by Dr. Graciela Husbands.   [name] Hyperlipidemia   . OA (osteoarthritis)   . Abnormal EKG     Chronically abnormal EKG.     Past Surgical History  Procedure Date  .  Cardiac catheterization     Has had remote anterior MI with stenting of the LAD and LCX in 2003. Has had multiple repeat caths.   . Insert / replace / remove pacemaker   . Icd   . Back surgery   . Shoulder surgery   . Knee surgery   . Colon surgery   . Spine surgery     Family History  Problem Relation Age of Onset  . Diabetes Mother   . Diabetes Brother   . Arthritis      family history  . Prostate cancer      Family History    History  Substance Use Topics  . Smoking status: Former Smoker    Quit date: 03/22/1995  . Smokeless tobacco: Not on file   Comment: quit in 1992  . Alcohol Use: No    OB History    Grav Para Term Preterm Abortions TAB SAB Ect Mult Living                  Review of Systems  Constitutional: Positive for diaphoresis. Negative for fever and chills.  Respiratory: Positive for shortness of breath. Negative for cough.   Cardiovascular: Positive for chest pain. Negative for palpitations, orthopnea and near-syncope.  Gastrointestinal: Positive for nausea, vomiting and abdominal pain (chronic; recent colostomy reversal).  Musculoskeletal: Negative for back pain.  Skin: Negative for color change and rash.  Neurological: Negative for weakness, light-headedness and headaches.  Psychiatric/Behavioral: Negative for altered mental status.  All other systems reviewed and are negative.    Allergies  Penicillins and Statins  Home Medications   Current Outpatient Rx  Name Route Sig Dispense Refill  . AMIODARONE HCL 200 MG PO TABS Oral Take 1 tablet (200 mg total) by mouth daily. 90 tablet 4  . CARVEDILOL 12.5 MG PO TABS Oral Take 1 tablet (12.5 mg total) by mouth 2 (two) times daily. 180 tablet 4  . CYCLOBENZAPRINE HCL 5 MG PO TABS Oral Take 5 mg by mouth as needed.  30 tablet 0  . EZETIMIBE 10 MG PO TABS Oral Take 1 tablet (10 mg total) by mouth daily. 30 tablet 11  . FOLTX PO Oral Take by mouth daily.      [name] FEXOFENADINE HCL 180 MG PO TABS Oral  Take 180 mg by mouth daily.      . OMEGA-3 FATTY ACIDS 1000 MG PO CAPS Oral Take 1,000 mg by mouth daily.      [name] FLUTICASONE PROPIONATE 50 MCG/ACT NA SUSP Nasal Place 2 sprays into the nose as needed.      . FUROSEMIDE 20 MG PO TABS Oral Take 20 mg by mouth daily.      [name] HYDROCODONE-ACETAMINOPHEN 5-325 MG PO TABS Oral Take 1 tablet by mouth every 4 (four) hours as needed for pain. 90 tablet 3  . LEVOTHYROXINE SODIUM 150 MCG PO TABS Oral Take 1 tablet (150 mcg total) by mouth daily.  90 tablet 3  . LISINOPRIL 2.5 MG PO TABS Oral Take 5 mg by mouth daily.      [name] METHOCARBAMOL 500 MG PO TABS Oral Take 1 tablet (500 mg total) by mouth every 6 (six) hours as needed. 90 tablet 3  . METOLAZONE 2.5 MG PO TABS Oral Take 2.5 mg by mouth as needed.     [name] NITROGLYCERIN 0.4 MG SL SUBL Sublingual Place 1 tablet (0.4 mg total) under the tongue every 5 (five) minutes as needed for chest pain. 90 tablet 0  . PERCOCET PO Oral Take by mouth as needed.      [name] PROTONIX PO Oral Take by mouth daily.      [name] POTASSIUM CHLORIDE CRYS CR 20 MEQ PO TBCR Oral Take 20 mEq by mouth daily.      . WARFARIN SODIUM 3 MG PO TABS  As Directed per Anticoagulation Clinic 90 tablet 3    There were no vitals taken for this visit.  Physical Exam  Nursing note and vitals reviewed. Constitutional: She is oriented to person, place, and time. She appears well-developed and well-nourished.  HENT:  Head: Normocephalic and atraumatic.  Eyes: Pupils are equal, round, and reactive to light.  Cardiovascular: Normal rate, regular rhythm, normal heart sounds and intact distal pulses.   No murmur heard. Pulmonary/Chest: Effort normal and breath sounds normal. No respiratory distress.  Abdominal: Soft. She exhibits no distension. There is tenderness (mild, diffuse). There is no rebound and no guarding.  Musculoskeletal: She exhibits no edema.  Neurological: She is alert and oriented to person, place, and time.  Skin: Skin is warm and dry.    Psychiatric: She has a normal mood and affect.    ED Course  Procedures (including critical care time)   Labs Reviewed  CBC  DIFFERENTIAL  BASIC METABOLIC PANEL  PROTIME-INR  CARDIAC PANEL(CRET KIN+CKTOT+MB+TROPI)   No results found.   Date: 02/17/2011  Rate: 87  Rhythm: normal sinus rhythm  QRS Axis: normal  Intervals: normal  ST/T Wave abnormalities: ST elevations inferiorly and ST elevations laterally elevations most pronounced in II, III, aVF; mild elevation in V3, V4, less in V5, V6.   Conduction Disutrbances:none  Narrative Interpretation: acute STEMI  Old EKG Reviewed: changes noted new ST elevations    1. STEMI (ST elevation myocardial infarction)   2. Coronary atherosclerosis of native coronary artery   3. Chronic systolic heart failure       MDM  60 yo F presents with onset of CP at 0700 this morning, accompanied by SOB, N/V, diaphoresis, similar to her prior MI. States she took 4 aspirin, as well as total 3 nitro (1 of her own, 2 per EMS), with some improvement of pain. EKG from EMS shows ?inferolateral elevations, but poor baseline and difficult to tell. EKG obtained here shows inferolateral ST elevations; Code STEMI called. Had already taken asa at home, so will not repeat here. Will start heparin for treatment. Pt to cath lab with cardiology.   Theotis Burrow, MD 02/17/11 1104  Note opened in error Reed Eifert [place]

## 2011-02-17 NOTE — Interval H&P Note (Signed)
History and Physical Interval Note:  02/17/2011 10:41 AM  [name]  has presented today for surgery, with the diagnosis of stemi  The various methods of treatment have been discussed with the patient and family. After consideration of risks, benefits and other options for treatment, the patient has consented to  Procedure(s): LEFT HEART CATHETERIZATION WITH CORONARY ANGIOGRAM as a surgical intervention .  The patients' history has been reviewed, patient examined, no change in status, stable for surgery.  I have reviewed the patients' chart and labs.  Questions were answered to the patient's satisfaction.   Patient presents with acute onset chest pain. Ecg shows inferolateral ST elevation that appears increased compared to baseline. Hx of Cad with ischemic cardiomyopathy. Prior stents. Brought directly to cath lab for emergent cardiac cath.  Theron Arista [place]

## 2011-02-17 NOTE — Progress Notes (Signed)
Spoke with patient briefly before procedure and found her after procedure.  She was feeling better and appeared relatively calm.  I informed her that her brother had been notified by the staff and she was appreciative.  She said she has a strong support system between family, friends and faith community.   02/17/11 1100  Clinical Encounter Type  Visited With Patient  Visit Type Post-op

## 2011-02-17 NOTE — Consult Note (Signed)
ANTICOAGULATION CONSULT NOTE - Initial Consult  Pharmacy Consult for Coumadin Indication: History of A-fib  Allergies  Allergen Reactions  . Penicillins     Throat swells  . Prevacid     Severe nausea  . Statins     cramps    Patient Measurements: Height: 5\' 6"  (167.6 cm) Weight: 154 lb (69.854 kg) IBW/kg (Calculated) : 59.3    Vital Signs: BP: 99/50 mmHg (11/29 1700) Pulse Rate: 83  (11/29 1018)  Labs:  Basename 02/17/11 1024 02/16/11  HGB 12.6 --  HCT 36.9 --  PLT 173 --  APTT >200* --  LABPROT 33.0* --  INR 3.17* 2.6  HEPARINUNFRC -- --  CREATININE 1.03 --  CKTOTAL 85 --  CKMB 2.9 --  TROPONINI <0.30 --   Estimated Creatinine Clearance: 53.7 ml/min (by C-G formula based on Cr of 1.03).  Medical History: Past Medical History  Diagnosis Date  . Spinal stenosis   . Numbness of foot     left foot  . Diverticulitis     Status post partial colectomy 1/12 with reversal in July 2012  . Colitis, ischemic   . DJD (degenerative joint disease)     History of multiple surgeries to the back, shoulder and knee  . Ischemic cardiomyopathy     Echo 06/16/10: EF 25-30%, anteroseptal and apical hypokinesis, moderate AI, mild MR  . Chronic systolic heart failure   . CAD (coronary artery disease)     a.  Ant MI 8/03 with stenting of the LAD;  b. staged PCI with Cypher DES to OM1 8/03;  c. s/p Cypher DES to LAD 7/04;  d.  multiple cardiac caths in past (chronically abnl ECG);    e. cardiac catheterization 3/12: LAD stent patent, distal LAD 40-50%, small ostial D1 80%, ostial D2 60%, OM-1 stent patent (20-30%), proximal RCA 50%, proximal to mid RCA 40-50%  . LV (left ventricular) mural thrombus     Chronic Coumadin therapy  . Stroke     History of TIA and possible CVA; hospitalized 2004; on coumadin  . CKD (chronic kidney disease), stage III     creatinine:  1.3 in 4/12;    . Tobacco abuse     history  . GERD (gastroesophageal reflux disease)   . Hypertension   . Lower  GI bleed     August 2011  . SVT (supraventricular tachycardia)     ? h/o AFib;  patient on long term amiodarone therapy  . Hypothyroidism   . Aortic insufficiency   . Pseudoaneurysm     History of, right forearm  . ICD (implantable cardiac defibrillator), single, in situ     [employer];  2008; followed by Dr. Graciela Husbands.   [name] Hyperlipidemia   . OA (osteoarthritis)   . Abnormal EKG     Chronically abnormal EKG.     Medications:  Prescriptions prior to admission  Medication Sig Dispense Refill  . amiodarone (PACERONE) 200 MG tablet Take 1 tablet (200 mg total) by mouth daily.  90 tablet  4  . carvedilol (COREG) 12.5 MG tablet Take 1 tablet (12.5 mg total) by mouth 2 (two) times daily.  180 tablet  4  . cyclobenzaprine (FLEXERIL) 5 MG tablet Take 5 mg by mouth 2 (two) times daily as needed. For pain       . ezetimibe (ZETIA) 10 MG tablet Take 1 tablet (10 mg total) by mouth daily.  30 tablet  11  . fexofenadine (ALLEGRA) 180 MG tablet Take 180  mg by mouth daily.       . fish oil-omega-3 fatty acids 1000 MG capsule Take 1,000 mg by mouth daily.        . fluticasone (FLONASE) 50 MCG/ACT nasal spray Place 2 sprays into the nose daily as needed. For allergies        . folic acid-pyridoxine-cyancobalamin (FOLTX) 2.5-25-2 MG TABS Take 1 tablet by mouth daily.        . furosemide (LASIX) 20 MG tablet Take 20 mg by mouth daily.        [name] HYDROcodone-acetaminophen (NORCO) 5-325 MG per tablet Take 1 tablet by mouth every 4 (four) hours as needed for pain.  90 tablet  3  . levothyroxine (SYNTHROID) 150 MCG tablet Take 1 tablet (150 mcg total) by mouth daily.  90 tablet  3  . lisinopril (PRINIVIL,ZESTRIL) 2.5 MG tablet Take 2.5 mg by mouth 2 (two) times daily.        . methocarbamol (ROBAXIN) 500 MG tablet Take 500 mg by mouth 4 (four) times daily as needed. For spasms       . metolazone (ZAROXOLYN) 2.5 MG tablet Take 2.5 mg by mouth daily as needed. For heavy duty fluid build up.       .  nitroGLYCERIN (NITROSTAT) 0.4 MG SL tablet Place 1 tablet (0.4 mg total) under the tongue every 5 (five) minutes as needed for chest pain.  90 tablet  0  . oxyCODONE-acetaminophen (PERCOCET) 5-325 MG per tablet Take 1 tablet by mouth every 4 (four) hours as needed. For pain       . pantoprazole (PROTONIX) 40 MG tablet Take 40 mg by mouth daily.        . potassium chloride SA (K-DUR,KLOR-CON) 20 MEQ tablet Take 20 mEq by mouth daily.        [name] warfarin (COUMADIN) 3 MG tablet Take 3 mg by mouth daily.        . methocarbamol (ROBAXIN) 500 MG tablet Take 1 tablet (500 mg total) by mouth every 6 (six) hours as needed.  90 tablet  3  . DISCONTD: FA-Pyridoxine-Cyancobalamin (FOLTX PO) Take by mouth daily.         Assessment: Patient is a 60 y/o female with a known history of ischemic cardiomyopathy. She has an ICD in place and is on amiodarone for SVT and  Atrial fibrillation.  She is on chronic coumadin for a prior stroke and past mural thrombus in the LV.  She also has a history of chronic systolic heart failure with an EF of 25 to 30%.  Goal of Therapy:  INR 2-3   Plan:  No Coumadin today. Daily PT/INR, CBC.  Aariya Ferrick, Elisha Headland, Pharm.D. 02/17/2011 6:35 PM

## 2011-02-17 NOTE — ED Notes (Signed)
Patient's brother was called at her request [phone]. Young.

## 2011-02-17 NOTE — ED Notes (Signed)
Pt leaving for cath lab via stretcher, cardiologist at bedside.

## 2011-02-17 NOTE — Op Note (Signed)
Cardiac Catheterization Procedure Note  Name: [name] MRN: [id number] DOB: 01/12/1951  Procedure: Left Heart Cath, Selective Coronary Angiography, LV angiography  Indication: 60 year old black female with history of ischemic cardiomyopathy and prior coronary stents who presents with acute onset of chest pain. ECG shows ST elevation in the inferior lateral leads increased from her baseline.   Procedural Details: The right wrist was prepped, draped, and anesthetized with 1% lidocaine. Using the modified Seldinger technique, a 6 French sheath was introduced into the right radial artery. 3 mg of verapamil was administered through the sheath, weight-based unfractionated heparin was administered already in the emergency department. Standard Judkins catheters were used for selective coronary angiography and left ventriculography. Catheter exchanges were performed over an exchange length guidewire. There were no immediate procedural complications. A TR band was used for radial hemostasis at the completion of the procedure.  The patient was transferred to the post catheterization recovery area for further monitoring.  Procedural Findings: Hemodynamics: AO per hemodynamic sheet. LV per hemodynamic data sheet.  Coronary angiography: Coronary dominance: right  Left mainstem: Normal  Left anterior descending (LAD): This is a large vessel. There is a stent in the mid vessel between the first and second diagonal branches which is widely patent. The mid to distal LAD is widely patent. There is a 40% stenosis of the takeoff of the second diagonal. The first diagonal has an 80% stenosis at its origin.  Left circumflex (LCx): This vessel gives rise to a large obtuse marginal vessel and then continues as a marginal branch on the lateral wall. The first obtuse marginal vessel has a stent which is widely patent. There is no significant disease in the circumflex distribution.  Right coronary artery (RCA):  There is diffuse 30% disease in the proximal to mid vessel.  Left ventriculography: Was not performed due to concerns of her contrast load.  Final Conclusions:  1. No significant obstructive coronary disease. Prior stents in the LAD and first obtuse marginal vessel are widely patent. There are no lesions to explain her chest pain. Her chest pain has currently resolved.   Recommendations: We'll admit the patient to the hospital to obtain serial cardiac enzymes and further evaluate the etiology of her chest pain.  Peter Swazil[place] 02/17/2011, 10:52 AM

## 2011-02-17 NOTE — H&P (Signed)
Hospital Admission Note Date: 02/17/2011  Patient name: [name] Medical record number: [id number] Date of birth: 08-08-1950 Age: 60 y.o. Gender: female PCP: Sanda Linger, MD, MD   Chief Complaint: chest pain  History of Present Illness: 60 y/o woman with the past medical history significant for CAD s/p MI in 2003 treated with stenting of LAD followed by staged PCI with stent to OM-1 , multiple cardiac caths last being in 03/12 that's showed patent stents , atrial fibrillation on coumadin, CHF with EF of 25% s/p ICD presents to the ER with chest pain. She states that her chest pain started around 7 AM after getting up this AM. She describes her pain as sharp, rated her pain as 10/10 when it started , took some NTG which did not help when she called EMS. Her pain was associated with SOB, nausea, vomiting and diaphoresis. When she presented to the ER , EKG showed ST elevation in inferlolateral leads and Code STEMI was activated.  She endorses some abdominal tenderness as she had colostomy reversal about 2-3 weeks ago.   Current Facility-Administered Medications  Medication Dose Route Frequency Provider Last Rate Last Dose  . heparin 100 units/mL bolus via infusion 4,000 Units  4,000 Units Intravenous Once Glynn Octave, MD   4,000 Units at 02/17/11 0957  . heparin 1000 UNIT/ML injection           . heparin 2-0.9 UNIT/ML-% infusion           . lidocaine (XYLOCAINE) 1 % injection           . ondansetron (ZOFRAN) 4 MG/2ML injection           . verapamil (ISOPTIN) 2.5 MG/ML injection             Allergies: Penicillins and Statins  Past Medical History  Diagnosis Date  . Spinal stenosis   . Numbness of foot     left foot  . Diverticulitis     Status post partial colectomy 1/12 with reversal in July 2012  . Colitis, ischemic   . DJD (degenerative joint disease)     History of multiple surgeries to the back, shoulder and knee  . Ischemic cardiomyopathy     Echo 06/16/10: EF 25-30%,  anteroseptal and apical hypokinesis, moderate AI, mild MR  . Chronic systolic heart failure   . CAD (coronary artery disease)     a.  Ant MI 8/03 with stenting of the LAD;  b. staged PCI with Cypher DES to OM1 8/03;  c. s/p Cypher DES to LAD 7/04;  d.  multiple cardiac caths in past (chronically abnl ECG);    e. cardiac catheterization 3/12: LAD stent patent, distal LAD 40-50%, small ostial D1 80%, ostial D2 60%, OM-1 stent patent (20-30%), proximal RCA 50%, proximal to mid RCA 40-50%  . LV (left ventricular) mural thrombus     Chronic Coumadin therapy  . Stroke     History of TIA and possible CVA; hospitalized 2004; on coumadin  . CKD (chronic kidney disease), stage III     creatinine:  1.3 in 4/12;    . Tobacco abuse     history  . GERD (gastroesophageal reflux disease)   . Hypertension   . Lower GI bleed     August 2011  . SVT (supraventricular tachycardia)     ? h/o AFib;  patient on long term amiodarone therapy  . Hypothyroidism   . Aortic insufficiency   . Pseudoaneurysm     History  of, right forearm  . ICD (implantable cardiac defibrillator), single, in situ     [employer];  2008; followed by Dr. Graciela Husbands.   [name] Hyperlipidemia   . OA (osteoarthritis)   . Abnormal EKG     Chronically abnormal EKG.     Past Surgical History  Procedure Date  . Cardiac catheterization     Has had remote anterior MI with stenting of the LAD and LCX in 2003. Has had multiple repeat caths.   . Insert / replace / remove pacemaker   . Icd   . Back surgery   . Shoulder surgery   . Knee surgery   . Colon surgery   . Spine surgery     Family History  Problem Relation Age of Onset  . Diabetes Mother   . Diabetes Brother   . Arthritis      family history  . Prostate cancer      Family History    History   Social History  . Marital Status: Widowed    Spouse Name: N/A    Number of Children: N/A  . Years of Education: N/A   Occupational History  . Not on file.   Social History  Main Topics  . Smoking status: Former Smoker    Quit date: 03/22/1995  . Smokeless tobacco: Not on file   Comment: quit in 1992  . Alcohol Use: No  . Drug Use: No  . Sexually Active: No   Other Topics Concern  . Not on file   Social History Narrative  . No narrative on file    Review of Systems: Constitutional: negative for chills, fatigue, fevers, malaise and night sweats Cardiovascular: positive for chest pain and dyspnea Gastrointestinal: negative for abdominal pain and diarrhea Musculoskeletal:negative for arthralgias and myalgias Neurological: negative for tremors, vertigo and weakness  Physical Exam:  Filed Vitals:   02/17/11 0950  BP: 143/90  Pulse: 103  Resp: 20  SpO2: 96%   General appearance: alert, cooperative and mild distress Lungs: clear to auscultation bilaterally Heart: regular rhythm, normal rate, distant heart sounds, no murmur, rubs or gallop. Abdomen: soft, tender to palpation diffusely all over her abdomen, no organomegaly, positive bowel sounds Extremities: extremities normal, atraumatic, no cyanosis or edema Pulses: 2+ and symmetric Lymph nodes: Cervical, supraclavicular, and axillary nodes normal. Neurologic: Alert and oriented X 3, normal strength and tone. Normal symmetric reflexes. Normal coordination and gait   Lab results: CBC:    Component Value Date/Time   WBC 7.0 02/17/2011 1024   HGB 12.6 02/17/2011 1024   HCT 36.9 02/17/2011 1024   PLT 173 02/17/2011 1024   MCV 100.3* 02/17/2011 1024   NEUTROABS 5.4 02/17/2011 1024   LYMPHSABS 0.9 02/17/2011 1024   MONOABS 0.6 02/17/2011 1024   EOSABS 0.0 02/17/2011 1024   BASOSABS 0.0 02/17/2011 1024     BMET    Component Value Date/Time   NA 141 02/17/2011 1024   K 3.9 02/17/2011 1024   CL 105 02/17/2011 1024   CO2 20 02/17/2011 1024   GLUCOSE 154* 02/17/2011 1024   BUN 18 02/17/2011 1024   CREATININE 1.03 02/17/2011 1024   CALCIUM 8.3* 02/17/2011 1024   GFRNONAA 57* 02/17/2011  1024   GFRAA 67* 02/17/2011 1024      Ref. Range 02/16/2011 00:00 02/17/2011 10:24  CK, MB Latest Range: 0.3-4.0 ng/mL  2.9  CK Total Latest Range: 7-177 U/L  85  Troponin I Latest Range: <0.30 ng/mL  <0.30  Imaging results:  EKG: normal sinus rhythm, rate 87 bpm, ST elevation in leads II, III, aVF, V5 and V6.  Other results: Cardiac cath: Left mainstem: Normal   Left anterior descending (LAD): This is a large vessel. There is a stent in the mid vessel between the first and second diagonal branches which is widely patent. The mid to distal LAD is widely patent. There is a 40% stenosis of the takeoff of the second diagonal. The first diagonal has an 80% stenosis at its origin.   Left circumflex (LCx): This vessel gives rise to a large obtuse marginal vessel and then continues as a marginal branch on the lateral wall. The first obtuse marginal vessel has a stent which is widely patent. There is no significant disease in the circumflex distribution.  Right coronary artery (RCA): There is diffuse 30% disease in the proximal to mid vessel.   Left ventriculography: Was not performed due to concerns of her contrast load.   Final Conclusions: 1. No significant obstructive coronary disease. Prior stents in the LAD and first obtuse marginal vessel are widely patent. There are no lesions to explain her chest pain. Her chest pain has currently resolved    Assessment & Plan by Problem: Patient Active Hospital Problem List:  1. CAD:  S/p MI in 2003 s/p stenting of LAD followed by staged PCI with stent in OM-1 , multiple cardiac caths due to abnormal EKG with last being in 03/12 when medical management was recommended.  (a)Unstable angina:  She presented with chest pain at rest that started this AM and her EKG showed ST elevation in leads II, III and aVF due to which code STEMI was activated and she had emergent cardiac cath . She was found to have no new obstructive CAD with prior patent  stents in LAD, circumflex and medical management is recommended.     Plan: Continue warfarin and ASA.            Continue statin, BB blocker, ACE- inhibitor.            Get 2D echo.  2. Ischemic cardiomyopathy: EF- 25% per echo in 03/12.  Plan: Continue medical management with ASA, stain, BB blocker, ACE-I.          Repeat 2D echo.  3. Chronic Atrial fibrillation ; on coumadin. Currently rate controlled.      Plan: Continue amiodarone and coumadin.   4. Hypertension (02/17/2011): BP stable.      Plan: Continue home meds.  5. Hyperlipidemia: Continue home med- zetia                                  Get FLP in AM  6. CKD stage II/III: Follow up BMET.                                  Continue to monitor.  7. Hypothyroidism; Continue home dose of synthroid.                                Will check TSH.

## 2011-02-17 NOTE — ED Notes (Signed)
Chest pain since 0700 was 6/10 and now 4/10.

## 2011-02-18 DIAGNOSIS — I359 Nonrheumatic aortic valve disorder, unspecified: Secondary | ICD-10-CM

## 2011-02-18 LAB — BASIC METABOLIC PANEL
CO2: 25 mEq/L (ref 19–32)
Chloride: 101 mEq/L (ref 96–112)
Creatinine, Ser: 1.15 mg/dL — ABNORMAL HIGH (ref 0.50–1.10)
GFR calc Af Amer: 58 mL/min — ABNORMAL LOW (ref >90)
Potassium: 3.9 mEq/L (ref 3.5–5.1)
Sodium: 137 mEq/L (ref 135–145)

## 2011-02-18 LAB — LIPID PANEL
Cholesterol: 209 mg/dL — ABNORMAL HIGH (ref 0–200)
HDL: 104 mg/dL (ref >39)
Total CHOL/HDL Ratio: 2 RATIO
Triglycerides: 56 mg/dL (ref <150)
VLDL: 11 mg/dL (ref 0–40)

## 2011-02-18 MED ORDER — AZITHROMYCIN 250 MG PO TABS: ORAL_TABLET | ORAL | Status: AC

## 2011-02-18 MED ORDER — ASPIRIN 81 MG PO TBEC: 81.0000 mg | DELAYED_RELEASE_TABLET | Freq: Every day | ORAL | Status: DC

## 2011-02-18 MED ORDER — WARFARIN SODIUM 3 MG PO TABS
3.0000 mg | ORAL_TABLET | Freq: Once | ORAL | Status: DC
  Filled 2011-02-18: qty 1

## 2011-02-18 MED FILL — Dextrose Inj 5%: INTRAVENOUS | Qty: 50 | Status: AC

## 2011-02-18 NOTE — Progress Notes (Signed)
*  PRELIMINARY RESULTS* Echocardiogram 2D Echocardiogram has been performed.  Clide Deutscher RDCS 02/18/2011, 12:39 PM

## 2011-02-18 NOTE — Progress Notes (Signed)
UR Completed.  [name] 02/18/2011 [phone]  

## 2011-02-18 NOTE — Discharge Summary (Signed)
Discharge Summary   Patient ID: [name] MRN: [id number], DOB/AGE: 1950-11-28 60 y.o. Admit date: 02/17/2011 D/C date:     02/18/2011   Primary Discharge Diagnoses:  1. Chest pain - initially called code STEMI but negative cardiac enzymes and cath showing no new obstructive disease, for medical management 2. CAD  - Ant MI 8/03 with stenting of the LAD - staged PCI with Cypher DES to OM1 8/03 - s/p Cypher DES to LAD 7/04 - multiple cardiac caths in past (chronically abnl ECG) - cardiac catheterization this admission 3. Sinusitis  Secondary Discharge Diagnoses:  1. SVT - ?Questionable history of atrial fibrillation - on amiodarone 2. CVA 3. 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 Chronic systolic CHF secondary to ICM - Echo 06/16/10: EF 25-30%, anteroseptal and apical hypokinesis, moderate AI, mild MR - hx of [employer] ICD implantation 2008 5. CAD  - Ant MI 8/03 with stenting of the LAD - staged PCI with Cypher DES to OM1 8/03 - s/p Cypher DES to LAD 7/04 - multiple cardiac caths in past (chronically abnl ECG) - cardiac catheterization this admission 6. Stroke - History of TIA and possible CVA; hospitalized 2004 7. CKD stage III 8. HL 9. Hypothyroidism 10. Pseudoaneurysm of right forearm 11. Diverticulitis s/p partial colectomy 03/2010 12. Ischemic colitis 13. Spinal stenosis 14. DJD (multiple orthopedic surgeries) 15. HTN 16. Lower GI bleed August 2011  Hospital Course: Ms. Sosinski is a 60 y/o F with complex PMH as noted above who presented to the ER with complaints of chest pain, sharp, 10/10. NTG did not help. She did have nausea, vomiting, SOB, and diaphoresis.When she presented to the ER , EKG showed ST elevation in inferlolateral leads and Code STEMI was activated. She went urgently to the cath lab where cath showed no new obstructive CAD with prior patent stents in LAD, circumflex and medical management is recommended. Plan was to continue coumadin,  ASA, BB, statin, ACEI, and obtain 2D echo with plans for med mgmt. Cardiac enzymes were cycled which were negative. D-dimer was 0.59. INR was 3.17 on admission, so her Coumadin was held last night. TSH was WNL. This morning she is feeling well without CP or SOB. She does have some sinus drainage and reports some tenderness over her left maxillary sinus over the past few days. She was afebrile but Dr. Antoine Poche was concerned for sinusitis so would like her to be discharged with azithromycin. She was also noted to have low blood pressure although she tolerates this and no changes were made. She ambulated today without difficulty. Dr. Antoine Poche has seen and examined her today and feels she is stable for discharge. Given recent last INRs of 1.6 and 2.6 prior to this admission, will continue current dose of Coumadin and recheck Tuesday 02/22/11. Per discussion with Dr. Antoine Poche, low dose ASA will be added to her home medicine regimen.  Discharge Vitals: Blood pressure 110/53, pulse 56, temperature 98.5 F (36.9 C), temperature source Oral, resp. rate 19, height 5\' 6"  (1.676 m), weight 153 lb 7 oz (69.6 kg), SpO2 100.00%.  Labs: Lab Results  Component Value Date   WBC 6.3 02/17/2011   HGB 12.0 02/17/2011   HCT 35.9* 02/17/2011   MCV 101.1* 02/17/2011   PLT 143* 02/17/2011    Lab 02/17/11 1804  NA 138  K 4.9  CL 102  CO2 24  BUN 19  CREATININE 1.12*  CALCIUM 8.5  PROT 6.5  BILITOT 0.6  ALKPHOS 63  ALT  19  AST 37  GLUCOSE 87    Basename 02/17/11 1805 02/17/11 1024  CKTOTAL 82 85  CKMB 3.1 2.9  TROPONINI <0.30 <0.30   Lab Results  Component Value Date   CHOL 242* 02/17/2011   HDL 114 02/17/2011   LDLCALC 118* 02/17/2011   TRIG 49 02/17/2011   Lab Results  Component Value Date   DDIMER 0.59* 02/17/2011    Diagnostic Studies/Procedures:  1. Cardiac catheterization this admission, please see full report and above for summary.   Discharge Medications   Current Discharge  Medication List    START taking these medications   Details  aspirin EC 81 MG EC tablet Take 1 tablet (81 mg total) by mouth daily.    azithromycin (ZITHROMAX) 250 MG tablet By mouth -- Take 2 tablets on day 1, and 1 tablet daily for 4 additional days Qty: 6 each, Refills: 0      CONTINUE these medications which have NOT CHANGED   Details  amiodarone (PACERONE) 200 MG tablet Take 1 tablet (200 mg total) by mouth daily. Qty: 90 tablet, Refills: 4    carvedilol (COREG) 12.5 MG tablet Take 1 tablet (12.5 mg total) by mouth 2 (two) times daily. Qty: 180 tablet, Refills: 4   Associated Diagnoses: Chronic systolic heart failure; Coronary atherosclerosis of native coronary artery    cyclobenzaprine (FLEXERIL) 5 MG tablet Take 5 mg by mouth 2 (two) times daily as needed. For pain     ezetimibe (ZETIA) 10 MG tablet Take 1 tablet (10 mg total) by mouth daily. Qty: 30 tablet, Refills: 11   Associated Diagnoses: Coronary atherosclerosis of native coronary artery    fexofenadine (ALLEGRA) 180 MG tablet Take 180 mg by mouth daily.     fish oil-omega-3 fatty acids 1000 MG capsule Take 1,000 mg by mouth daily.      fluticasone (FLONASE) 50 MCG/ACT nasal spray Place 2 sprays into the nose daily as needed. For allergies      folic acid-pyridoxine-cyancobalamin (FOLTX) 2.5-25-2 MG TABS Take 1 tablet by mouth daily.      furosemide (LASIX) 20 MG tablet Take 20 mg by mouth daily.      HYDROcodone-acetaminophen (NORCO) 5-325 MG per tablet Take 1 tablet by mouth every 4 (four) hours as needed for pain. Qty: 90 tablet, Refills: 3    levothyroxine (SYNTHROID) 150 MCG tablet Take 1 tablet (150 mcg total) by mouth daily. Qty: 90 tablet, Refills: 3   Associated Diagnoses: Unspecified hypothyroidism    lisinopril (PRINIVIL,ZESTRIL) 2.5 MG tablet Take 2.5 mg by mouth 2 (two) times daily.      methocarbamol (ROBAXIN) 500 MG tablet Take 500 mg by mouth 4 (four) times daily as needed. For spasms       metolazone (ZAROXOLYN) 2.5 MG tablet Take 2.5 mg by mouth daily as needed. For heavy duty fluid build up.     nitroGLYCERIN (NITROSTAT) 0.4 MG SL tablet Place 1 tablet (0.4 mg total) under the tongue every 5 (five) minutes as needed for chest pain. Qty: 90 tablet, Refills: 0   Associated Diagnoses: Coronary artery disease    oxyCODONE-acetaminophen (PERCOCET) 5-325 MG per tablet Take 1 tablet by mouth every 4 (four) hours as needed. For pain     pantoprazole (PROTONIX) 40 MG tablet Take 40 mg by mouth daily.      potassium chloride SA (K-DUR,KLOR-CON) 20 MEQ tablet Take 20 mEq by mouth daily.      warfarin (COUMADIN) 3 MG tablet Take 3 mg by mouth  daily.          Disposition   The patient will be discharged in stable condition to home. Discharge Orders    Future Appointments: Provider: Department: Dept Phone: Center:   02/22/2011 12:30 PM Raul Del, RN [facility] [phone] None   03/11/2011 9:15 AM Rollene Rotunda, MD [facility] [phone] [id number]     Future Orders Please Complete By Expires   Diet - low sodium heart healthy      Increase activity slowly      Comments:   No driving for 3 days. No lifting over 5 lbs for 1 week. No sexual activity for 1 week.     Discharge wound care:      Comments:   Keep procedure site clean & dry. If you notice increased pain, swelling, bleeding or pus, call/return!  You may shower, but no soaking baths/hot tubs/pools for 1 week.       Follow-up Information    Follow up with Rollene Rotunda, MD. (03/11/11 at 9:15am)    Contact information:   [address] [place] [address] [phone]       Follow up with [facility] (Coumadin Clinic appointment has been moved up to 02/22/11 at 12:30pm )    Contact information:   [address] [place] [address]            Duration of Discharge Encounter: Greater than 30 minutes including  physician and PA time.  Signed, Kriste Basque Dunn PA-C 02/18/2011, 2:19 PM

## 2011-02-18 NOTE — Progress Notes (Signed)
SUBJECTIVE:  No chest pain or SOB.  She does have some sinus drainage and reports some tenderness over her left maxillary sinus over the past few days   PHYSICAL EXAM Filed Vitals:   02/18/11 0400 02/18/11 0500 02/18/11 0600 02/18/11 0742  BP: 87/48  81/66 95/41  Pulse:      Temp: 98.6 F (37 C)   98.6 F (37 C)  TempSrc: Oral   Oral  Resp: 15  14 19   Height:      Weight:  153 lb 7 oz (69.6 kg)    SpO2: 96%      General:  No distress Lungs:  Clear Heart:  RRR Abdomen:  Positive bowel sounds, no rebound no guarding Extremities:  Right wrist without echymosis.  Slight swelling.  No ext edema.  LABS: Lab Results  Component Value Date   CKTOTAL 82 02/17/2011   CKMB 3.1 02/17/2011   TROPONINI <0.30 02/17/2011   Results for orders placed during the hospital encounter of 02/17/11 (from the past 24 hour(s))  CBC     Status: Abnormal   Collection Time   02/17/11 10:24 AM      Component Value Range   WBC 7.0  4.0 - 10.5 (K/uL)   RBC 3.68 (*) 3.87 - 5.11 (MIL/uL)   Hemoglobin 12.6  12.0 - 15.0 (g/dL)   HCT 16.1  09.6 - 04.5 (%)   MCV 100.3 (*) 78.0 - 100.0 (fL)   MCH 34.2 (*) 26.0 - 34.0 (pg)   MCHC 34.1  30.0 - 36.0 (g/dL)   RDW 40.9 (*) 81.1 - 15.5 (%)   Platelets 173  150 - 400 (K/uL)  DIFFERENTIAL     Status: Abnormal   Collection Time   02/17/11 10:24 AM      Component Value Range   Neutrophils Relative 78 (*) 43 - 77 (%)   Neutro Abs 5.4  1.7 - 7.7 (K/uL)   Lymphocytes Relative 12  12 - 46 (%)   Lymphs Abs 0.9  0.7 - 4.0 (K/uL)   Monocytes Relative 9  3 - 12 (%)   Monocytes Absolute 0.6  0.1 - 1.0 (K/uL)   Eosinophils Relative 0  0 - 5 (%)   Eosinophils Absolute 0.0  0.0 - 0.7 (K/uL)   Basophils Relative 0  0 - 1 (%)   Basophils Absolute 0.0  0.0 - 0.1 (K/uL)  PROTIME-INR     Status: Abnormal   Collection Time   02/17/11 10:24 AM      Component Value Range   Prothrombin Time 33.0 (*) 11.6 - 15.2 (seconds)   INR 3.17 (*) 0.00 - 1.49   CARDIAC PANEL(CRET  KIN+CKTOT+MB+TROPI)     Status: Normal   Collection Time   02/17/11 10:24 AM      Component Value Range   Total CK 85  7 - 177 (U/L)   CK, MB 2.9  0.3 - 4.0 (ng/mL)   Troponin I <0.30  <0.30 (ng/mL)   Relative Index RELATIVE INDEX IS INVALID  0.0 - 2.5   COMPREHENSIVE METABOLIC PANEL     Status: Abnormal   Collection Time   02/17/11 10:24 AM      Component Value Range   Sodium 141  135 - 145 (mEq/L)   Potassium 3.9  3.5 - 5.1 (mEq/L)   Chloride 105  96 - 112 (mEq/L)   CO2 20  19 - 32 (mEq/L)   Glucose, Bld 154 (*) 70 - 99 (mg/dL)   BUN 18  6 - 23 (mg/dL)   Creatinine, Ser 4.09  0.50 - 1.10 (mg/dL)   Calcium 8.3 (*) 8.4 - 10.5 (mg/dL)   Total Protein 6.9  6.0 - 8.3 (g/dL)   Albumin 3.5  3.5 - 5.2 (g/dL)   AST 42 (*) 0 - 37 (U/L)   ALT 22  0 - 35 (U/L)   Alkaline Phosphatase 68  39 - 117 (U/L)   Total Bilirubin 0.5  0.3 - 1.2 (mg/dL)   GFR calc non Af Amer 57 (*) >90 (mL/min)   GFR calc Af Amer 67 (*) >90 (mL/min)  D-DIMER, QUANTITATIVE     Status: Abnormal   Collection Time   02/17/11 10:24 AM      Component Value Range   D-Dimer, Quant 0.59 (*) 0.00 - 0.48 (ug/mL-FEU)  HEMOGLOBIN A1C     Status: Normal   Collection Time   02/17/11 10:24 AM      Component Value Range   Hemoglobin A1C 5.1  <5.7 (%)   Mean Plasma Glucose 100  <117 (mg/dL)  LIPID PANEL     Status: Abnormal   Collection Time   02/17/11 10:24 AM      Component Value Range   Cholesterol 242 (*) 0 - 200 (mg/dL)   Triglycerides 49  <811 (mg/dL)   HDL 914  >78 (mg/dL)   Total CHOL/HDL Ratio 2.1     VLDL 10  0 - 40 (mg/dL)   LDL Cholesterol 295 (*) 0 - 99 (mg/dL)  APTT     Status: Abnormal   Collection Time   02/17/11 10:24 AM      Component Value Range   aPTT >200 (*) 24 - 37 (seconds)  POCT I-STAT, CHEM 8     Status: Abnormal   Collection Time   02/17/11 10:30 AM      Component Value Range   Sodium 140  135 - 145 (mEq/L)   Potassium 4.1  3.5 - 5.1 (mEq/L)   Chloride 109  96 - 112 (mEq/L)   BUN 19   6 - 23 (mg/dL)   Creatinine, Ser 6.21 (*) 0.50 - 1.10 (mg/dL)   Glucose, Bld 308 (*) 70 - 99 (mg/dL)   Calcium, Ion 6.57  8.46 - 1.32 (mmol/L)   TCO2 19  0 - 100 (mmol/L)   Hemoglobin 14.3  12.0 - 15.0 (g/dL)   HCT 96.2  95.2 - 84.1 (%)  CBC     Status: Abnormal   Collection Time   02/17/11  6:04 PM      Component Value Range   WBC 6.3  4.0 - 10.5 (K/uL)   RBC 3.55 (*) 3.87 - 5.11 (MIL/uL)   Hemoglobin 12.0  12.0 - 15.0 (g/dL)   HCT 32.4 (*) 40.1 - 46.0 (%)   MCV 101.1 (*) 78.0 - 100.0 (fL)   MCH 33.8  26.0 - 34.0 (pg)   MCHC 33.4  30.0 - 36.0 (g/dL)   RDW 02.7 (*) 25.3 - 15.5 (%)   Platelets 143 (*) 150 - 400 (K/uL)  DIFFERENTIAL     Status: Abnormal   Collection Time   02/17/11  6:04 PM      Component Value Range   Neutrophils Relative 55  43 - 77 (%)   Neutro Abs 3.5  1.7 - 7.7 (K/uL)   Lymphocytes Relative 29  12 - 46 (%)   Lymphs Abs 1.9  0.7 - 4.0 (K/uL)   Monocytes Relative 15 (*) 3 - 12 (%)   Monocytes Absolute 1.0  0.1 - 1.0 (K/uL)   Eosinophils Relative 0  0 - 5 (%)   Eosinophils Absolute 0.0  0.0 - 0.7 (K/uL)   Basophils Relative 0  0 - 1 (%)   Basophils Absolute 0.0  0.0 - 0.1 (K/uL)  COMPREHENSIVE METABOLIC PANEL     Status: Abnormal   Collection Time   02/17/11  6:04 PM      Component Value Range   Sodium 138  135 - 145 (mEq/L)   Potassium 4.9  3.5 - 5.1 (mEq/L)   Chloride 102  96 - 112 (mEq/L)   CO2 24  19 - 32 (mEq/L)   Glucose, Bld 87  70 - 99 (mg/dL)   BUN 19  6 - 23 (mg/dL)   Creatinine, Ser 1.61 (*) 0.50 - 1.10 (mg/dL)   Calcium 8.5  8.4 - 09.6 (mg/dL)   Total Protein 6.5  6.0 - 8.3 (g/dL)   Albumin 3.4 (*) 3.5 - 5.2 (g/dL)   AST 37  0 - 37 (U/L)   ALT 19  0 - 35 (U/L)   Alkaline Phosphatase 63  39 - 117 (U/L)   Total Bilirubin 0.6  0.3 - 1.2 (mg/dL)   GFR calc non Af Amer 52 (*) >90 (mL/min)   GFR calc Af Amer 60 (*) >90 (mL/min)  TSH     Status: Normal   Collection Time   02/17/11  6:04 PM      Component Value Range   TSH 0.493  0.350 -  4.500 (uIU/mL)  CARDIAC PANEL(CRET KIN+CKTOT+MB+TROPI)     Status: Normal   Collection Time   02/17/11  6:05 PM      Component Value Range   Total CK 82  7 - 177 (U/L)   CK, MB 3.1  0.3 - 4.0 (ng/mL)   Troponin I <0.30  <0.30 (ng/mL)   Relative Index RELATIVE INDEX IS INVALID  0.0 - 2.5   MRSA PCR SCREENING     Status: Normal   Collection Time   02/17/11  7:39 PM      Component Value Range   MRSA by PCR NEGATIVE  NEGATIVE   PROTIME-INR     Status: Abnormal   Collection Time   02/18/11  5:05 AM      Component Value Range   Prothrombin Time 26.6 (*) 11.6 - 15.2 (seconds)   INR 2.41 (*) 0.00 - 1.49     Intake/Output Summary (Last 24 hours) at 02/18/11 0839 Last data filed at 02/18/11 0600  Gross per 24 hour  Intake  847.5 ml  Output   1300 ml  Net -452.5 ml    EKG:  NSR, rate 55, old inferior MI, rate slower than previous. No acute ST T wave changes  ASSESSMENT AND PLAN:  Principal Problem:  *Unstable angina:  See cath results.  Continue medical management Active Problems:  HYPERTENSION:  Blood pressure is low but she tolerates this. Continue current therapy at this point.  Atrial fibrillation:  Continue warfarin  Sinus discomfort:  Possible sinusitis.  Discharge with Zpac.  Should have minimal effect on warfarin but she should have her INR checked Monday at the primary care office.  OK to discharge after she ambulates today.    Rollene Rotunda 02/18/2011 8:39 AM

## 2011-02-18 NOTE — Progress Notes (Signed)
Reviewed d/c info w/patient including d/c medications, CHF mgt. And f/u appt. Schedule. Pt. Calling brother for transportation. Will release in his care upon arrival.

## 2011-02-18 NOTE — Progress Notes (Signed)
Followed up with patient after her procedure yesterday.  She was sitting up and seemed in good spirits.  She was hoping to go home.  She appreciated the visit. [name] 1:09 PM   02/18/11 1300  Clinical Encounter Type  Visited With Patient  Visit Type Follow-up

## 2011-02-18 NOTE — Consult Note (Signed)
ANTICOAGULATION CONSULT NOTE - Follow Up Consult  Pharmacy Consult for Coumadin Indication: History of A-fib  Allergen  . Penicillins-Throat swells  . Prevacid-Severe nausea  . Statins-cramps    Patient Measurements: Height: 5\' 6"  Weight: 153 lb 7 oz (69.6 kg) IBW/kg (Calculated) : 59.3 kg    Vital Signs: Temp: 98.6 F (37 C) (11/30 0742) Temp src: Oral (11/30 0742) BP: 95/41 mmHg (11/30 0742) Pulse Rate: 67  (11/29 2218)  Labs:  Basename 02/18/11 0505 02/17/11 1805 02/17/11 1804 02/17/11 1030 02/17/11 1024 02/16/11  HGB -- -- 12.0 14.3 -- --  HCT -- -- 35.9* 42.0 36.9 --  PLT -- -- 143* -- 173 --  APTT -- -- -- -- >200* --  LABPROT 26.6* -- -- -- 33.0* --  INR 2.41* -- -- -- 3.17* 2.6  HEPARINUNFRC -- -- -- -- -- --  CREATININE -- -- 1.12* 1.20* 1.03 --  CKTOTAL -- 82 -- -- 85 --  CKMB -- 3.1 -- -- 2.9 --  TROPONINI -- <0.30 -- -- <0.30 --   Estimated Creatinine Clearance: 49.4 ml/min (by C-G formula based on Cr of 1.12).  Medical History: Diagnosis  . Spinal stenosis  . Numbness of foot-left foot  . Diverticulitis-Status post partial colectomy 1/12 with reversal in July 2012  . Colitis, ischemic  . DJD (degenerative joint disease)-History of multiple surgeries to the back, shoulder and knee  . Ischemic cardiomyopathy-Echo 06/16/10: EF 25-30%, anteroseptal and apical hypokinesis, moderate AI, mild MR  . Chronic systolic heart failure  . CAD (coronary artery disease)-a.  Ant MI 8/03 with stenting of the LAD;  b. staged PCI with Cypher DES to OM1 8/03;  c. s/p Cypher DES to LAD 7/04;  d.  multiple cardiac caths in past (chronically abnl ECG);    e. cardiac catheterization 3/12: LAD stent patent, distal LAD 40-50%, small ostial D1 80%, ostial D2 60%, OM-1 stent patent (20-30%), proximal RCA 50%, proximal to mid RCA 40-50%  . LV (left ventricular) mural thrombus  . Stroke  . CKD (chronic kidney disease), stage III  . Tobacco abuse  . GERD (gastroesophageal reflux  disease)  . Hypertension  . Lower GI bleed  . SVT (supraventricular tachycardia)  . Hypothyroidism  . Aortic insufficiency  . Pseudoaneurysm  . ICD (implantable cardiac defibrillator), single, in situ  . Hyperlipidemia  . OA (osteoarthritis)  . Abnormal EKG       Medications:  Medication Sig  . amiodarone (PACERONE) 200 MG tablet Take 1 tablet (200 mg total) by mouth daily.  . carvedilol (COREG) 12.5 MG tablet Take 1 tablet (12.5 mg total) by mouth 2 (two) times daily.  . cyclobenzaprine (FLEXERIL) 5 MG tablet Take 5 mg by mouth 2 (two) times daily as needed. For pain   . ezetimibe (ZETIA) 10 MG tablet Take 1 tablet (10 mg total) by mouth daily.  . fexofenadine (ALLEGRA) 180 MG tablet Take 180 mg by mouth daily.   . fish oil-omega-3 fatty acids 1000 MG capsule Take 1,000 mg by mouth daily.    . fluticasone (FLONASE) 50 MCG/ACT nasal spray Place 2 sprays into the nose daily as needed. For allergies    . folic acid-pyridoxine-cyancobalamin (FOLTX) 2.5-25-2 MG TABS Take 1 tablet by mouth daily.    . furosemide (LASIX) 20 MG tablet Take 20 mg by mouth daily.    [name] HYDROcodone-acetaminophen (NORCO) 5-325 MG per tablet Take 1 tablet by mouth every 4 (four) hours as needed for pain.  [name] levothyroxine (SYNTHROID) 150  MCG tablet Take 1 tablet (150 mcg total) by mouth daily.  [name] lisinopril (PRINIVIL,ZESTRIL) 2.5 MG tablet Take 2.5 mg by mouth 2 (two) times daily.    . methocarbamol (ROBAXIN) 500 MG tablet Take 500 mg by mouth 4 (four) times daily as needed. For spasms   . metolazone (ZAROXOLYN) 2.5 MG tablet Take 2.5 mg by mouth daily as needed. For heavy duty fluid build up.   . nitroGLYCERIN (NITROSTAT) 0.4 MG SL tablet Place 1 tablet (0.4 mg total) under the tongue every 5 (five) minutes as needed for chest pain.  [name] oxyCODONE-acetaminophen (PERCOCET) 5-325 MG per tablet Take 1 tablet by mouth every 4 (four) hours as needed. For pain   . pantoprazole (PROTONIX) 40 MG tablet Take 40 mg by mouth  daily.    . potassium chloride SA (K-DUR,KLOR-CON) 20 MEQ tablet Take 20 mEq by mouth daily.    [name] warfarin (COUMADIN) 3 MG tablet Take 3 mg by mouth daily.    . methocarbamol (ROBAXIN) 500 MG tablet Take 1 tablet (500 mg total) by mouth every 6 (six) hours as needed.  [name] DISCONTD: FA-Pyridoxine-Cyancobalamin (FOLTX PO) Take by mouth daily.     Assessment: Patient is a 60 y/o female with a known history of ischemic cardiomyopathy. She has an ICD in place and is on amiodarone for SVT and  Atrial fibrillation.  She is on chronic coumadin for a prior stroke and past mural thrombus in the LV.  She also has a history of chronic systolic heart failure with an EF of 25 to 30%.  Goal of Therapy:  INR 2-3   Plan:  Warfarin 3 mg PO x 1 today if not discharged. Daily PT/INR, CBC.  Nadara Mustard Dickens, [place].D. 02/18/2011 9:20 AM

## 2011-02-18 NOTE — Discharge Summary (Signed)
Patient seen and examined.  Plan as discussed in my rounding note for today and outlined above. Rollene Rotunda  02/18/2011  2:51 PM

## 2011-02-22 ENCOUNTER — Encounter: Payer: Medicare Other | Admitting: *Deleted

## 2011-02-23 ENCOUNTER — Encounter: Payer: Medicare Other | Admitting: *Deleted

## 2011-02-28 ENCOUNTER — Encounter: Payer: Medicare Other | Admitting: *Deleted

## 2011-03-07 ENCOUNTER — Emergency Department (HOSPITAL_COMMUNITY): Payer: Medicare Other

## 2011-03-07 ENCOUNTER — Encounter (HOSPITAL_COMMUNITY): Payer: Self-pay | Admitting: *Deleted

## 2011-03-07 ENCOUNTER — Other Ambulatory Visit: Payer: Self-pay

## 2011-03-07 ENCOUNTER — Inpatient Hospital Stay (HOSPITAL_COMMUNITY)
Admission: EM | Admit: 2011-03-07 | Discharge: 2011-03-08 | DRG: 313 | Disposition: A | Discharge status: Home / Self Care | Payer: Medicare Other | Attending: Cardiology | Admitting: Cardiology

## 2011-03-07 DIAGNOSIS — I509 Heart failure, unspecified: Secondary | ICD-10-CM | POA: Diagnosis present

## 2011-03-07 DIAGNOSIS — I513 Intracardiac thrombosis, not elsewhere classified: Secondary | ICD-10-CM | POA: Insufficient documentation

## 2011-03-07 DIAGNOSIS — E039 Hypothyroidism, unspecified: Secondary | ICD-10-CM | POA: Diagnosis present

## 2011-03-07 DIAGNOSIS — E785 Hyperlipidemia, unspecified: Secondary | ICD-10-CM | POA: Diagnosis present

## 2011-03-07 DIAGNOSIS — I5022 Chronic systolic (congestive) heart failure: Secondary | ICD-10-CM

## 2011-03-07 DIAGNOSIS — R9431 Abnormal electrocardiogram [ECG] [EKG]: Secondary | ICD-10-CM | POA: Insufficient documentation

## 2011-03-07 DIAGNOSIS — R0789 Other chest pain: Principal | ICD-10-CM | POA: Diagnosis present

## 2011-03-07 DIAGNOSIS — I4891 Unspecified atrial fibrillation: Secondary | ICD-10-CM | POA: Diagnosis present

## 2011-03-07 DIAGNOSIS — Z7901 Long term (current) use of anticoagulants: Secondary | ICD-10-CM | POA: Insufficient documentation

## 2011-03-07 DIAGNOSIS — Z95810 Presence of automatic (implantable) cardiac defibrillator: Secondary | ICD-10-CM

## 2011-03-07 DIAGNOSIS — K219 Gastro-esophageal reflux disease without esophagitis: Secondary | ICD-10-CM | POA: Insufficient documentation

## 2011-03-07 DIAGNOSIS — R079 Chest pain, unspecified: Secondary | ICD-10-CM

## 2011-03-07 DIAGNOSIS — N189 Chronic kidney disease, unspecified: Secondary | ICD-10-CM | POA: Insufficient documentation

## 2011-03-07 DIAGNOSIS — I5042 Chronic combined systolic (congestive) and diastolic (congestive) heart failure: Secondary | ICD-10-CM | POA: Insufficient documentation

## 2011-03-07 DIAGNOSIS — I200 Unstable angina: Secondary | ICD-10-CM

## 2011-03-07 DIAGNOSIS — Z8673 Personal history of transient ischemic attack (TIA), and cerebral infarction without residual deficits: Secondary | ICD-10-CM | POA: Insufficient documentation

## 2011-03-07 DIAGNOSIS — I255 Ischemic cardiomyopathy: Secondary | ICD-10-CM | POA: Insufficient documentation

## 2011-03-07 DIAGNOSIS — I2589 Other forms of chronic ischemic heart disease: Secondary | ICD-10-CM | POA: Diagnosis present

## 2011-03-07 DIAGNOSIS — I2510 Atherosclerotic heart disease of native coronary artery without angina pectoris: Secondary | ICD-10-CM | POA: Insufficient documentation

## 2011-03-07 DIAGNOSIS — Z87891 Personal history of nicotine dependence: Secondary | ICD-10-CM

## 2011-03-07 DIAGNOSIS — I10 Essential (primary) hypertension: Secondary | ICD-10-CM | POA: Diagnosis present

## 2011-03-07 LAB — CBC
HCT: 35.4 % — ABNORMAL LOW (ref 36.0–46.0)
Hemoglobin: 12.2 g/dL (ref 12.0–15.0)
Hemoglobin: 11.9 g/dL — ABNORMAL LOW (ref 12.0–15.0)
MCH: 34.3 pg — ABNORMAL HIGH (ref 26.0–34.0)
MCHC: 34.1 g/dL (ref 30.0–36.0)
MCHC: 33.6 g/dL (ref 30.0–36.0)
RBC: 3.52 MIL/uL — ABNORMAL LOW (ref 3.87–5.11)
RDW: 15.4 % (ref 11.5–15.5)
WBC: 4.9 10*3/uL (ref 4.0–10.5)

## 2011-03-07 LAB — COMPREHENSIVE METABOLIC PANEL
CO2: 25 mEq/L (ref 19–32)
Calcium: 9.1 mg/dL (ref 8.4–10.5)
Creatinine, Ser: 1.13 mg/dL — ABNORMAL HIGH (ref 0.50–1.10)
GFR calc Af Amer: 60 mL/min — ABNORMAL LOW (ref >90)
GFR calc non Af Amer: 51 mL/min — ABNORMAL LOW (ref >90)
Glucose, Bld: 79 mg/dL (ref 70–99)
Total Protein: 6.9 g/dL (ref 6.0–8.3)

## 2011-03-07 LAB — PROTIME-INR
INR: 2.01 — ABNORMAL HIGH (ref 0.00–1.49)
Prothrombin Time: 23.1 seconds — ABNORMAL HIGH (ref 11.6–15.2)

## 2011-03-07 LAB — BASIC METABOLIC PANEL
Chloride: 106 mEq/L (ref 96–112)
GFR calc Af Amer: 57 mL/min — ABNORMAL LOW (ref >90)
Potassium: 4.7 mEq/L (ref 3.5–5.1)
Sodium: 139 mEq/L (ref 135–145)

## 2011-03-07 LAB — DIFFERENTIAL
Basophils Relative: 0 % (ref 0–1)
Lymphs Abs: 1.6 10*3/uL (ref 0.7–4.0)
Monocytes Relative: 16 % — ABNORMAL HIGH (ref 3–12)
Neutro Abs: 2.4 10*3/uL (ref 1.7–7.7)
Neutrophils Relative %: 50 % (ref 43–77)

## 2011-03-07 LAB — CARDIAC PANEL(CRET KIN+CKTOT+MB+TROPI)
Relative Index: INVALID (ref 0.0–2.5)
Troponin I: <0.3 ng/mL (ref <0.30)

## 2011-03-07 LAB — TSH: TSH: 1.408 u[IU]/mL (ref 0.350–4.500)

## 2011-03-07 LAB — TROPONIN I: Troponin I: <0.3 ng/mL (ref <0.30)

## 2011-03-07 MED ORDER — FLUTICASONE PROPIONATE 50 MCG/ACT NA SUSP
2.0000 | Freq: Every day | NASAL | Status: DC | PRN
  Filled 2011-03-07: qty 16

## 2011-03-07 MED ORDER — HYDROCODONE-ACETAMINOPHEN 5-325 MG PO TABS: 1.0000 | ORAL_TABLET | ORAL | Status: DC | PRN

## 2011-03-07 MED ORDER — ONDANSETRON HCL 4 MG/2ML IJ SOLN: 4.0000 mg | Freq: Four times a day (QID) | INTRAMUSCULAR | Status: DC | PRN

## 2011-03-07 MED ORDER — OMEGA-3 FATTY ACIDS 1000 MG PO CAPS
1000.0000 mg | ORAL_CAPSULE | Freq: Every day | ORAL | Status: DC
  Administered 2011-03-07 – 2011-03-08 (×2): 1000 mg via ORAL
  Filled 2011-03-07 (×2): qty 1

## 2011-03-07 MED ORDER — NITROGLYCERIN 0.4 MG SL SUBL: 0.4000 mg | SUBLINGUAL_TABLET | SUBLINGUAL | Status: DC | PRN

## 2011-03-07 MED ORDER — POTASSIUM CHLORIDE CRYS ER 20 MEQ PO TBCR
20.0000 meq | EXTENDED_RELEASE_TABLET | Freq: Every day | ORAL | Status: DC
  Administered 2011-03-07 – 2011-03-08 (×2): 20 meq via ORAL
  Filled 2011-03-07 (×2): qty 1

## 2011-03-07 MED ORDER — NITROGLYCERIN 0.4 MG SL SUBL
0.4000 mg | SUBLINGUAL_TABLET | SUBLINGUAL | Status: DC | PRN
  Administered 2011-03-07: 0.4 mg via SUBLINGUAL

## 2011-03-07 MED ORDER — CARVEDILOL 12.5 MG PO TABS
12.5000 mg | ORAL_TABLET | Freq: Two times a day (BID) | ORAL | Status: DC
  Filled 2011-03-07 (×2): qty 1

## 2011-03-07 MED ORDER — SODIUM CHLORIDE 0.9 % IV SOLN: 250.0000 mL | INTRAVENOUS | Status: DC | PRN

## 2011-03-07 MED ORDER — LEVOTHYROXINE SODIUM 150 MCG PO TABS
150.0000 ug | ORAL_TABLET | Freq: Every day | ORAL | Status: DC
  Administered 2011-03-07 – 2011-03-08 (×2): 150 ug via ORAL
  Filled 2011-03-07 (×3): qty 1

## 2011-03-07 MED ORDER — ASPIRIN EC 81 MG PO TBEC
81.0000 mg | DELAYED_RELEASE_TABLET | Freq: Every day | ORAL | Status: DC
  Administered 2011-03-07 – 2011-03-08 (×2): 81 mg via ORAL
  Filled 2011-03-07 (×2): qty 1

## 2011-03-07 MED ORDER — SODIUM CHLORIDE 0.9 % IJ SOLN: 3.0000 mL | INTRAMUSCULAR | Status: DC | PRN

## 2011-03-07 MED ORDER — LISINOPRIL 2.5 MG PO TABS
2.5000 mg | ORAL_TABLET | Freq: Two times a day (BID) | ORAL | Status: DC
  Administered 2011-03-07 – 2011-03-08 (×2): 2.5 mg via ORAL
  Filled 2011-03-07 (×3): qty 1

## 2011-03-07 MED ORDER — ACETAMINOPHEN 325 MG PO TABS: 650.0000 mg | ORAL_TABLET | ORAL | Status: DC | PRN

## 2011-03-07 MED ORDER — AMIODARONE HCL 200 MG PO TABS
200.0000 mg | ORAL_TABLET | Freq: Every day | ORAL | Status: DC
  Administered 2011-03-07: 200 mg via ORAL
  Filled 2011-03-07 (×2): qty 1

## 2011-03-07 MED ORDER — FUROSEMIDE 20 MG PO TABS
20.0000 mg | ORAL_TABLET | Freq: Every day | ORAL | Status: DC
  Administered 2011-03-07 – 2011-03-08 (×2): 20 mg via ORAL
  Filled 2011-03-07 (×2): qty 1

## 2011-03-07 MED ORDER — EZETIMIBE 10 MG PO TABS
10.0000 mg | ORAL_TABLET | Freq: Every day | ORAL | Status: DC
  Administered 2011-03-07: 10 mg via ORAL
  Filled 2011-03-07 (×2): qty 1

## 2011-03-07 MED ORDER — SODIUM CHLORIDE 0.9 % IJ SOLN
3.0000 mL | Freq: Two times a day (BID) | INTRAMUSCULAR | Status: DC
  Administered 2011-03-07: 3 mL via INTRAVENOUS

## 2011-03-07 MED ORDER — PANTOPRAZOLE SODIUM 40 MG PO TBEC
40.0000 mg | DELAYED_RELEASE_TABLET | Freq: Every day | ORAL | Status: DC
  Administered 2011-03-07 – 2011-03-08 (×2): 40 mg via ORAL
  Filled 2011-03-07 (×2): qty 1

## 2011-03-07 MED ORDER — CYCLOBENZAPRINE HCL 5 MG PO TABS
5.0000 mg | ORAL_TABLET | Freq: Two times a day (BID) | ORAL | Status: DC | PRN
  Administered 2011-03-07: 5 mg via ORAL
  Filled 2011-03-07 (×2): qty 1

## 2011-03-07 MED ORDER — LORATADINE 10 MG PO TABS
10.0000 mg | ORAL_TABLET | Freq: Every day | ORAL | Status: DC
  Administered 2011-03-07 – 2011-03-08 (×2): 10 mg via ORAL
  Filled 2011-03-07 (×2): qty 1

## 2011-03-07 MED ORDER — FA-PYRIDOXINE-CYANOCOBALAMIN 2.5-25-2 MG PO TABS
1.0000 | ORAL_TABLET | Freq: Every day | ORAL | Status: DC
  Administered 2011-03-07 – 2011-03-08 (×2): 1 via ORAL
  Filled 2011-03-07 (×2): qty 1

## 2011-03-07 MED ORDER — WARFARIN SODIUM 3 MG PO TABS
3.0000 mg | ORAL_TABLET | ORAL | Status: AC
  Administered 2011-03-07: 3 mg via ORAL
  Filled 2011-03-07: qty 1

## 2011-03-07 NOTE — ED Notes (Signed)
Pt reports being on coumadin, last took last night

## 2011-03-07 NOTE — ED Notes (Signed)
Cardiology has been to bedside.  stemi cancelled at this time.  Patient remains alert and oriented.  Skin warm and dry.  Patient labs have been given to phlebotomy.  Family remains at the bedside.

## 2011-03-07 NOTE — ED Notes (Addendum)
Pt brother has all belongings.

## 2011-03-07 NOTE — ED Provider Notes (Signed)
History     CSN: [id number] Arrival date & time: 03/07/2011  2:25 PM   Chief Complaint  Patient presents with  . Chest Pain   HPI Pt was seen at 1425.  Per pt, c/o gradual onset and persistence of constant chest "pain" that began PTA while sitting at home.  Pt describes the CP as "heaviness," and "different from my usual CP."  Has been associated with SOB, diaphoresis, nausea, and radiation into her jaw and left arm.  Pt took ASA and ntg sl x2 with relief of symptoms.  Denies palpitaitons, no cough, no abd pain, no back pain, no vomiting/diarrhea, no fevers.     Cards:  Dr. Antoine Poche Past Medical History  Diagnosis Date  . Spinal stenosis   . Numbness of foot     left foot  . Diverticulitis     Status post partial colectomy 1/12 with reversal in July 2012  . Colitis, ischemic   . DJD (degenerative joint disease)     History of multiple surgeries to the back, shoulder and knee  . Ischemic cardiomyopathy     Echo 06/16/10: EF 25-30%, anteroseptal and apical hypokinesis, moderate AI, mild MR  . Chronic systolic heart failure   . CAD (coronary artery disease)     a.  Ant MI 8/03 with stenting of the LAD;  b. staged PCI with Cypher DES to OM1 8/03;  c. s/p Cypher DES to LAD 7/04;  d.  multiple cardiac caths in past (chronically abnl ECG);    e. cardiac catheterization 3/12: LAD stent patent, distal LAD 40-50%, small ostial D1 80%, ostial D2 60%, OM-1 stent patent (20-30%), proximal RCA 50%, proximal to mid RCA 40-50%  . LV (left ventricular) mural thrombus     Chronic Coumadin therapy  . Stroke     History of TIA and possible CVA; hospitalized 2004; on coumadin  . CKD (chronic kidney disease), stage III     creatinine:  1.3 in 4/12;    . Tobacco abuse     history  . GERD (gastroesophageal reflux disease)   . Hypertension   . Lower GI bleed     August 2011  . SVT (supraventricular tachycardia)     ? h/o AFib;  patient on long term amiodarone therapy  . Hypothyroidism   . Aortic  insufficiency   . Pseudoaneurysm     History of, right forearm  . ICD (implantable cardiac defibrillator), single, in situ     [employer];  2008; followed by Dr. Graciela Husbands.   [name] Hyperlipidemia   . OA (osteoarthritis)   . Abnormal EKG     Chronically abnormal EKG.     Past Surgical History  Procedure Date  . Cardiac catheterization     Has had remote anterior MI with stenting of the LAD and LCX in 2003. Has had multiple repeat caths.   . Insert / replace / remove pacemaker   . Icd   . Back surgery   . Shoulder surgery   . Knee surgery   . Colon surgery   . Spine surgery     Family History  Problem Relation Age of Onset  . Diabetes Mother   . Diabetes Brother   . Arthritis      family history  . Prostate cancer      Family History    History  Substance Use Topics  . Smoking status: Former Smoker    Quit date: 03/22/1995  . Smokeless tobacco: Not on file  Comment: quit in 1992  . Alcohol Use: No   Review of Systems ROS: Statement: All systems negative except as marked or noted in the HPI; Constitutional: Negative for fever and chills. ; ; Eyes: Negative for eye pain, redness and discharge. ; ; ENMT: Negative for ear pain, hoarseness, nasal congestion, sinus pressure and sore throat. ; ; Cardiovascular: +CP, SOB, diaphoresis, Negative for palpitations and peripheral edema. ; ; Respiratory: Negative for cough, wheezing and stridor. ; ; Gastrointestinal: +nausea.  Negative for vomiting, diarrhea, abdominal pain, blood in stool, hematemesis, jaundice and rectal bleeding. . ; ; Genitourinary: Negative for dysuria, flank pain and hematuria. ; ; Musculoskeletal: Negative for back pain and neck pain. Negative for swelling and trauma.; ; Skin: Negative for pruritus, rash, abrasions, blisters, bruising and skin lesion.; ; Neuro: Negative for headache, lightheadedness and neck stiffness. Negative for weakness, altered level of consciousness , altered mental status, extremity weakness,  paresthesias, involuntary movement, seizure and syncope.       Allergies  Penicillins; Prevacid; and Statins  Home Medications   Current Outpatient Rx  Name Route Sig Dispense Refill  . AMIODARONE HCL 200 MG PO TABS Oral Take 1 tablet (200 mg total) by mouth daily. 90 tablet 4  . ASPIRIN 81 MG PO TBEC Oral Take 1 tablet (81 mg total) by mouth daily.    [name] CARVEDILOL 12.5 MG PO TABS Oral Take 1 tablet (12.5 mg total) by mouth 2 (two) times daily. 180 tablet 4  . CYCLOBENZAPRINE HCL 5 MG PO TABS Oral Take 5 mg by mouth 2 (two) times daily as needed. For pain     . EZETIMIBE 10 MG PO TABS Oral Take 1 tablet (10 mg total) by mouth daily. 30 tablet 11  . FEXOFENADINE HCL 180 MG PO TABS Oral Take 180 mg by mouth daily.     . OMEGA-3 FATTY ACIDS 1000 MG PO CAPS Oral Take 1,000 mg by mouth daily.      [name] FLUTICASONE PROPIONATE 50 MCG/ACT NA SUSP Nasal Place 2 sprays into the nose daily as needed. For allergies      . FA-PYRIDOXINE-CYANCOBALAMIN 2.5-25-2 MG PO TABS Oral Take 1 tablet by mouth daily.      . FUROSEMIDE 20 MG PO TABS Oral Take 20 mg by mouth daily.      [name] HYDROCODONE-ACETAMINOPHEN 5-325 MG PO TABS Oral Take 1 tablet by mouth every 4 (four) hours as needed for pain. 90 tablet 3  . LEVOTHYROXINE SODIUM 150 MCG PO TABS Oral Take 1 tablet (150 mcg total) by mouth daily. 90 tablet 3  . LISINOPRIL 2.5 MG PO TABS Oral Take 2.5 mg by mouth 2 (two) times daily.      [name] METHOCARBAMOL 500 MG PO TABS Oral Take 1 tablet (500 mg total) by mouth every 6 (six) hours as needed. 90 tablet 3  . METHOCARBAMOL 500 MG PO TABS Oral Take 500 mg by mouth 4 (four) times daily as needed. For spasms     . METOLAZONE 2.5 MG PO TABS Oral Take 2.5 mg by mouth daily as needed. For heavy duty fluid build up.     [name] NITROGLYCERIN 0.4 MG SL SUBL Sublingual Place 1 tablet (0.4 mg total) under the tongue every 5 (five) minutes as needed for chest pain. 90 tablet 0  . OXYCODONE-ACETAMINOPHEN 5-325 MG PO TABS Oral Take 1  tablet by mouth every 4 (four) hours as needed. For pain     . PANTOPRAZOLE SODIUM 40 MG PO TBEC Oral  Take 40 mg by mouth daily.      [name] POTASSIUM CHLORIDE CRYS CR 20 MEQ PO TBCR Oral Take 20 mEq by mouth daily.      . WARFARIN SODIUM 3 MG PO TABS Oral Take 3 mg by mouth daily.        BP 164/88  Pulse 71  Temp(Src) 99 F (37.2 C) (Oral)  Resp 14  SpO2 99%  Physical Exam 1430: Physical examination:  Nursing notes reviewed; Vital signs and O2 SAT reviewed;  Constitutional: Well developed, Well nourished, Well hydrated, In no acute distress; Head:  Normocephalic, atraumatic; Eyes: EOMI, PERRL, No scleral icterus; ENMT: Mouth and pharynx normal, Mucous membranes moist; Neck: Supple, Full range of motion, No lymphadenopathy; Cardiovascular: Regular rate and rhythm, No murmur or gallop; Respiratory: Breath sounds clear & equal bilaterally, No rales, rhonchi, wheezes, or rub, Normal respiratory effort/excursion; Chest: Nontender, Movement normal; Abdomen: Soft, Nontender, Nondistended, Normal bowel sounds; Extremities: Pulses normal, No tenderness, No edema, No calf edema or asymmetry.; Neuro: AA&Ox3, Major CN grossly intact.  No gross focal motor or sensory deficits in extremities.; Skin: Color normal, Warm, Dry, no rash.    ED Course  Procedures   1425:  EKG reviewed, pt with new STE inf leads compared to previous.  Reports different CP from her usual.  Code STEMI called.  1435:  Cards Dr. Tedra Senegal called back, case discussed, will come to ED for further eval.    1455:  Code STEMI cancelled by Cards at bedside.  Apparently they know pt well, has had recent cardiac cath without obstructive disease.  They will admit.    MDM  MDM Reviewed: previous chart, nursing note and vitals Reviewed previous: ECG Interpretation: ECG Total time providing critical care: 30-74 minutes. This excludes time spent performing separately reportable procedures and services.   CRITICAL CARE Performed by:  Laray Anger Total critical care time: 31 Critical care time was exclusive of separately billable procedures and treating other patients. Critical care was necessary to treat or prevent imminent or life-threatening deterioration. Critical care was time spent personally by me on the following activities: development of treatment plan with patient and/or surrogate as well as nursing, discussions with consultants, evaluation of patient's response to treatment, examination of patient, obtaining history from patient or surrogate, ordering and performing treatments and interventions, ordering and review of laboratory studies, ordering and review of radiographic studies, pulse oximetry and re-evaluation of patient's condition.    Date: 03/07/2011  Rate: 65  Rhythm: normal sinus rhythm  QRS Axis: left  Intervals: normal  ST/T Wave abnormalities: ST elevations inferiorly  Conduction Disutrbances:none  Narrative Interpretation:   Old EKG Reviewed: changes noted  Results for orders placed during the hospital encounter of 03/07/11  CBC      Component Value Range   WBC 4.9  4.0 - 10.5 (K/uL)   RBC 3.52 (*) 3.87 - 5.11 (MIL/uL)   Hemoglobin 12.2  12.0 - 15.0 (g/dL)   HCT 40.9 (*) 81.1 - 46.0 (%)   MCV 101.7 (*) 78.0 - 100.0 (fL)   MCH 34.7 (*) 26.0 - 34.0 (pg)   MCHC 34.1  30.0 - 36.0 (g/dL)   RDW 91.4  78.2 - 95.6 (%)   Platelets 165  150 - 400 (K/uL)  DIFFERENTIAL      Component Value Range   Neutrophils Relative 50  43 - 77 (%)   Neutro Abs 2.4  1.7 - 7.7 (K/uL)   Lymphocytes Relative 32  12 - 46 (%)  Lymphs Abs 1.6  0.7 - 4.0 (K/uL)   Monocytes Relative 16 (*) 3 - 12 (%)   Monocytes Absolute 0.8  0.1 - 1.0 (K/uL)   Eosinophils Relative 2  0 - 5 (%)   Eosinophils Absolute 0.1  0.0 - 0.7 (K/uL)   Basophils Relative 0  0 - 1 (%)   Basophils Absolute 0.0  0.0 - 0.1 (K/uL)    Dg Chest Portable 1 View  03/07/2011  *RADIOLOGY REPORT*  Clinical Data: Chest pain  PORTABLE CHEST - 1  VIEW  Comparison: 12/29/2010  Findings: Borderline cardiomegaly noted.  Prior vertebroplasty upper thoracic spine again noted.  No acute infiltrate or pleural effusion.  No pulmonary edema. Single lead cardiac pacemaker with tip in right ventricle.  IMPRESSION: No active disease.  Prior vertebral plasty upper thoracic spine.  Original Report Authenticated By: Natasha Mead, M.D.         Samuel Jester Allison Quarry, DO 03/09/11 1051

## 2011-03-07 NOTE — ED Notes (Signed)
Cath lab RN at bedside to evaluate patient

## 2011-03-07 NOTE — ED Notes (Signed)
Pt alert and oriented. Cardiology at bedside. Vitals stable. Family at bedside.

## 2011-03-07 NOTE — H&P (Signed)
Patient ID: [name] MRN: [id number], DOB/AGE: 1950/12/05   Admit date: 03/07/2011   Primary Physician: Sanda Linger, MD, MD Primary Cardiologist: Shela Commons. Hochrein  Pt. Profile:   60 y/o Af Am female with prior h/o cad and abnl ekg - last cathed 02/17/2011 - presented to ED today with recurrent chest pain.  Problem List: Past Medical History  Diagnosis Date  . Spinal stenosis   . Numbness of foot     left foot  . Diverticulitis     Status post partial colectomy 1/12 with reversal in July 2012  . Colitis, ischemic   . DJD (degenerative joint disease)     History of multiple surgeries to the back, shoulder and knee  . Ischemic cardiomyopathy     Echo 06/16/10: EF 25-30%, anteroseptal and apical hypokinesis, moderate AI, mild MR  . Chronic systolic heart failure   . CAD (coronary artery disease)     a.  Ant MI 8/03 with stenting of the LAD;  b. staged PCI with Cypher DES to OM1 8/03;  c. s/p Cypher DES to LAD 7/04;  d.  multiple cardiac caths in past (chronically abnl ECG);    e. cardiac catheterization 3/12: LAD stent patent, distal LAD 40-50%, small ostial D1 80%, ostial D2 60%, OM-1 stent patent (20-30%), proximal RCA 50%, proximal to mid RCA 40-50%; f. cath 02/17/2011 - nonobs  . LV (left ventricular) mural thrombus     Chronic Coumadin therapy  . Stroke     History of TIA and possible CVA; hospitalized 2004; on coumadin  . CKD (chronic kidney disease), stage III     creatinine:  1.3 in 4/12;    . Tobacco abuse     history  . GERD (gastroesophageal reflux disease)   . Hypertension   . Lower GI bleed     August 2011  . SVT (supraventricular tachycardia)     ? h/o AFib;  patient on long term amiodarone therapy  . Hypothyroidism   . Aortic insufficiency   . Pseudoaneurysm     History of, right forearm  . ICD (implantable cardiac defibrillator), single, in situ     [employer];  2008; followed by Dr. Graciela Husbands.   [name] Hyperlipidemia   . OA (osteoarthritis)   .  Abnormal EKG     Chronically abnormal EKG.     Past Surgical History  Procedure Date  . Cardiac catheterization     Has had remote anterior MI with stenting of the LAD and LCX in 2003. Has had multiple repeat caths.   . Insert / replace / remove pacemaker   . Icd   . Back surgery   . Shoulder surgery   . Knee surgery   . Colon surgery   . Spine surgery      Allergies:  Allergies  Allergen Reactions  . Penicillins     Throat swells  . Prevacid     Severe nausea  . Statins     cramps    HPI:   60 y/o female with above problem list.  She was last admitted in November with atypical chest pain and facial discomfort - code STEMI was called @ that time 2/2 ST elevation in Lead II.  Cath showed nonobs dzs with patent LAD, OM1 stents.  She r/o for MI and was later d/c'd.  She's been doing well since but today, while sitting and writing our Christmas cards, she had an episode of sharp/shooting left chest discomfort, lasting a few seconds  and followed by belching.  Ss would resolved for a few mins but then return, prompting her to present to the ED.  Here, she is pain free but noted t ohave ST elevation in lead II - not significantly changed from last ekg.   Home Medications No current facility-administered medications on file as of 03/07/2011.   Medications Prior to Admission  Medication Sig Dispense Refill  . amiodarone (PACERONE) 200 MG tablet Take 1 tablet (200 mg total) by mouth daily.  90 tablet  4  . aspirin EC 81 MG EC tablet Take 1 tablet (81 mg total) by mouth daily.      . carvedilol (COREG) 12.5 MG tablet Take 1 tablet (12.5 mg total) by mouth 2 (two) times daily.  180 tablet  4  . cyclobenzaprine (FLEXERIL) 5 MG tablet Take 5 mg by mouth 2 (two) times daily as needed. For pain       . ezetimibe (ZETIA) 10 MG tablet Take 1 tablet (10 mg total) by mouth daily.  30 tablet  11  . fexofenadine (ALLEGRA) 180 MG tablet Take 180 mg by mouth daily.       . fish oil-omega-3 fatty  acids 1000 MG capsule Take 1,000 mg by mouth daily.        . fluticasone (FLONASE) 50 MCG/ACT nasal spray Place 2 sprays into the nose daily as needed. For allergies        . folic acid-pyridoxine-cyancobalamin (FOLTX) 2.5-25-2 MG TABS Take 1 tablet by mouth daily.        . furosemide (LASIX) 20 MG tablet Take 20 mg by mouth daily.        [name] HYDROcodone-acetaminophen (NORCO) 5-325 MG per tablet Take 1 tablet by mouth every 4 (four) hours as needed for pain.  90 tablet  3  . levothyroxine (SYNTHROID) 150 MCG tablet Take 1 tablet (150 mcg total) by mouth daily.  90 tablet  3  . lisinopril (PRINIVIL,ZESTRIL) 2.5 MG tablet Take 2.5 mg by mouth 2 (two) times daily.        . methocarbamol (ROBAXIN) 500 MG tablet Take 1 tablet (500 mg total) by mouth every 6 (six) hours as needed.  90 tablet  3  . methocarbamol (ROBAXIN) 500 MG tablet Take 500 mg by mouth 4 (four) times daily as needed. For spasms       . metolazone (ZAROXOLYN) 2.5 MG tablet Take 2.5 mg by mouth daily as needed. For heavy duty fluid build up.       . nitroGLYCERIN (NITROSTAT) 0.4 MG SL tablet Place 1 tablet (0.4 mg total) under the tongue every 5 (five) minutes as needed for chest pain.  90 tablet  0  . oxyCODONE-acetaminophen (PERCOCET) 5-325 MG per tablet Take 1 tablet by mouth every 4 (four) hours as needed. For pain       . pantoprazole (PROTONIX) 40 MG tablet Take 40 mg by mouth daily.        . potassium chloride SA (K-DUR,KLOR-CON) 20 MEQ tablet Take 20 mEq by mouth daily.        [name] warfarin (COUMADIN) 3 MG tablet Take 3 mg by mouth daily.           Family History  Problem Relation Age of Onset  . Diabetes Mother   . Diabetes Brother   . Arthritis      family history  . Prostate cancer      Family History     History   Social History  .  Marital Status: Widowed    Spouse Name: N/A    Number of Children: N/A  . Years of Education: N/A   Occupational History  . Not on file.   Social History Main Topics  . Smoking  status: Former Smoker    Quit date: 03/22/1995  . Smokeless tobacco: Not on file   Comment: quit in 1992  . Alcohol Use: No  . Drug Use: No  . Sexually Active: No   Other Topics Concern  . Not on file   Social History Narrative  . No narrative on file     Review of Systems: General: negative for chills, fever, night sweats or weight changes.  Cardiovascular: +++ for chest pain occurring under her left breast. No dyspnea on exertion, edema, orthopnea, palpitations, paroxysmal nocturnal dyspnea. Dermatological: negative for rash Respiratory: negative for cough or wheezing Urologic: negative for hematuria Abdominal: negative for nausea, vomiting, diarrhea, bright red blood per rectum, melena, or hematemesis Neurologic: negative for visual changes, syncope, or dizziness All other systems reviewed and are otherwise negative except as noted above.  Physical Exam: Blood pressure 140/77, pulse 70, temperature 98.8 F (37.1 C), temperature source Oral, resp. rate 16, SpO2 100.00%.  General: Well developed, well nourished, in no acute distress. Head: Normocephalic, atraumatic, sclera non-icteric, no xanthomas, nares are without discharge.  Neck: Supple without bruits or JVD. Lungs:  Resp regular and unlabored, CTA. Heart: RRR no s3, s4, or murmurs. Abdomen: Soft, non-tender, non-distended, BS + x 4.   Left Chest wall - over left breast and axillary area are tender to palpation. Msk:  Strength and tone appears normal for age. Extremities: No clubbing, cyanosis or edema. DP/PT/Radials 2+ and equal bilaterally. Neuro: Alert and oriented X 3. Moves all extremities spontaneously. Psych: Normal affect.   Labs:  Pending.   Radiology/Studies:   CXR done - result pending  EKG:   RSR, 65, 1mm ST elevation in lead II.  Poor R progression..  Small Lat q's - unchanged from prior EKG's  ASSESSMENT AND PLAN:   1.  Chest Pain/Abnormal EKG:  Pt presents with recurrent, atypical,  intermittent left sided chest pain.  EKG is not acutely different and we need to be sure that she is given a copy of her EKG prior to d/c.  B/C of her history, we will observe her and rule her out.  Cont home meds and add a PPI - pt says she's been having a fair amt of gas @ home and indigestion.  If CE remain negative, prob d/c in am.  2.  ICM/Chronic Syst CHF:  Euvolemic.  S/p ICD.  Cont BB/Acei.  3.  A. Fib:  In sinus.  Cont amio/coumadin.  4.  HTN:  Resume home meds and follow.  5.  HL:  Cont zetia.     Signed, Nicolasa Ducking, NP  Patient seen and examined.  She is currently stable.  Her symptoms are atypical.  Her ECG is similar to what we have observed in the past.  Recent cath report reviewed.  I concur with the assessment and recommendations/plan of Mr.Berge.  Will observe overnight.    3:53 PM Shawnie Pons 03/07/2011  03/07/2011, 3:15 PM

## 2011-03-07 NOTE — Progress Notes (Signed)
Pt C/O right upper chest pain, non radiating described as "spasm"; relieved by Flexeril 5 mg given.

## 2011-03-07 NOTE — Progress Notes (Signed)
ANTICOAGULATION CONSULT NOTE - Initial Consult  Pharmacy Consult for coumadin Indication: history of LV thrombus  Allergies  Allergen Reactions  . Penicillins     Throat swells  . Prevacid     Severe nausea  . Statins     cramps    Vital Signs: Temp: 99.1 F (37.3 C) (12/17 1547) Temp src: Oral (12/17 1547) BP: 133/61 mmHg (12/17 1547) Pulse Rate: 51  (12/17 1547)  Labs:  Basename 03/07/11 1452 03/07/11 1451  HGB 12.2 --  HCT 35.8* --  PLT 165 --  APTT 31 --  LABPROT 23.1* --  INR 2.01* --  HEPARINUNFRC -- --  CREATININE 1.17* --  CKTOTAL -- --  CKMB -- --  TROPONINI -- <0.30   The CrCl is unknown because both a height and weight (above a minimum accepted value) are required for this calculation.  Medical History: Past Medical History  Diagnosis Date  . Spinal stenosis   . Numbness of foot     left foot  . Diverticulitis     Status post partial colectomy 1/12 with reversal in July 2012  . Colitis, ischemic   . DJD (degenerative joint disease)     History of multiple surgeries to the back, shoulder and knee  . Ischemic cardiomyopathy     Echo 06/16/10: EF 25-30%, anteroseptal and apical hypokinesis, moderate AI, mild MR  . Chronic systolic heart failure   . CAD (coronary artery disease)     a.  Ant MI 8/03 with stenting of the LAD;  b. staged PCI with Cypher DES to OM1 8/03;  c. s/p Cypher DES to LAD 7/04;  d.  multiple cardiac caths in past (chronically abnl ECG);    e. cardiac catheterization 3/12: LAD stent patent, distal LAD 40-50%, small ostial D1 80%, ostial D2 60%, OM-1 stent patent (20-30%), proximal RCA 50%, proximal to mid RCA 40-50%; f. cath 02/17/2011 - nonobs  . LV (left ventricular) mural thrombus     Chronic Coumadin therapy  . Stroke     History of TIA and possible CVA; hospitalized 2004; on coumadin  . CKD (chronic kidney disease), stage III     creatinine:  1.3 in 4/12;    . Tobacco abuse     history  . GERD (gastroesophageal reflux  disease)   . Hypertension   . Lower GI bleed     August 2011  . SVT (supraventricular tachycardia)     ? h/o AFib;  patient on long term amiodarone therapy  . Hypothyroidism   . Aortic insufficiency   . Pseudoaneurysm     History of, right forearm  . ICD (implantable cardiac defibrillator), single, in situ     [employer];  2008; followed by Dr. Graciela Husbands.   [name] Hyperlipidemia   . OA (osteoarthritis)   . Abnormal EKG     Chronically abnormal EKG.     Medications:  Prescriptions prior to admission  Medication Sig Dispense Refill  . amiodarone (PACERONE) 200 MG tablet Take 1 tablet (200 mg total) by mouth daily.  90 tablet  4  . aspirin 81 MG chewable tablet Chew 324 mg by mouth daily.        [name] aspirin 81 MG EC tablet Take 81 mg by mouth daily.        . carvedilol (COREG) 12.5 MG tablet Take 1 tablet (12.5 mg total) by mouth 2 (two) times daily.  180 tablet  4  . cyclobenzaprine (FLEXERIL) 5 MG tablet Take 5 mg  by mouth 2 (two) times daily as needed. For pain      . ezetimibe (ZETIA) 10 MG tablet Take 1 tablet (10 mg total) by mouth daily.  30 tablet  11  . fexofenadine (ALLEGRA) 180 MG tablet Take 180 mg by mouth daily.       . fish oil-omega-3 fatty acids 1000 MG capsule Take 1,000 mg by mouth daily.        . fluticasone (FLONASE) 50 MCG/ACT nasal spray Place 2 sprays into the nose daily as needed. For allergies        . folic acid-pyridoxine-cyancobalamin (FOLTX) 2.5-25-2 MG TABS Take 1 tablet by mouth daily.        . furosemide (LASIX) 20 MG tablet Take 20 mg by mouth daily.        [name] HYDROcodone-acetaminophen (NORCO) 5-325 MG per tablet Take 1 tablet by mouth every 4 (four) hours as needed. For pain       . levothyroxine (SYNTHROID) 150 MCG tablet Take 1 tablet (150 mcg total) by mouth daily.  90 tablet  3  . lisinopril (PRINIVIL,ZESTRIL) 2.5 MG tablet Take 2.5 mg by mouth 2 (two) times daily.        . methocarbamol (ROBAXIN) 500 MG tablet Take 500 mg by mouth 4 (four) times  daily as needed. For spasms       . metolazone (ZAROXOLYN) 2.5 MG tablet Take 2.5 mg by mouth daily as needed. For heavy duty fluid build up.       . nitroGLYCERIN (NITROSTAT) 0.4 MG SL tablet Place 0.4 mg under the tongue every 5 (five) minutes as needed. For chest pain       . oxyCODONE-acetaminophen (PERCOCET) 5-325 MG per tablet Take 1 tablet by mouth every 4 (four) hours as needed. For pain       . pantoprazole (PROTONIX) 40 MG tablet Take 40 mg by mouth daily.        . potassium chloride SA (K-DUR,KLOR-CON) 20 MEQ tablet Take 20 mEq by mouth daily.        [name] warfarin (COUMADIN) 3 MG tablet Take 3 mg by mouth daily.       . methocarbamol (ROBAXIN) 500 MG tablet Take 500 mg by mouth every 6 (six) hours as needed. For muscle spasms         Assessment: 60 yo female here with CP on coumadin PTA for  hx LV thrombus. Inr currently at goal, patient has not taken her coumadin today  Goal of Therapy:  INR 2-3   Plan:  Will give 3mg  coumadin today with PT/INR daily.   Benny Lennert 03/07/2011,4:55 PM

## 2011-03-07 NOTE — ED Notes (Signed)
Pt is here with chest pain that started as 10/10 and has had 324mg  of basa and 2 nitro.  Pt continues to have chest pain 6/10. Pt was sitting and writing christmas cards when the pain started.  Pt has pain from chest to jaw and then to bilateral shoulders and left arm.  Pt appears pale and has been on coumadin for 10 years since her last heart attack

## 2011-03-07 NOTE — ED Notes (Addendum)
STEMI called at 1428. Dr. Clarene Duke at bedside. EDP noting changes on EKG when compared to old EKG

## 2011-03-08 ENCOUNTER — Other Ambulatory Visit: Payer: Self-pay

## 2011-03-08 DIAGNOSIS — I200 Unstable angina: Secondary | ICD-10-CM

## 2011-03-08 LAB — PROTIME-INR: INR: 1.93 — ABNORMAL HIGH (ref 0.00–1.49)

## 2011-03-08 LAB — CARDIAC PANEL(CRET KIN+CKTOT+MB+TROPI)
Total CK: 78 U/L (ref 7–177)
Troponin I: <0.3 ng/mL (ref <0.30)

## 2011-03-08 NOTE — Discharge Summary (Signed)
Patient ID: [name],  MRN: [id number], DOB/AGE: Nov 24, 1950 60 y.o.  Admit date: 03/07/2011 Discharge date: 03/08/2011  Primary Care Provider: Sanda Linger Primary Cardiologist: Rollene Rotunda  Discharge Diagnoses Principal Problem:  *Unstable angina Active Problems:  HYPOTHYROIDISM  HYPERLIPIDEMIA  HYPERTENSION  Chronic systolic heart failure  Ischemic cardiomyopathy  Chronic anticoagulation  Stroke  CKD (chronic kidney disease)  GERD (gastroesophageal reflux disease)  LV (left ventricular) mural thrombus  Atrial fibrillation  CAD (coronary artery disease)  Abnormal EKG   Allergies Allergies  Allergen Reactions  . Penicillins     Throat swells  . Prevacid     Severe nausea  . Statins     cramps    History of Present Illness  60 y/o female with the above past medical history who was last admitted to [facility] in November 2012 with atypical chest pain.  She has a persistently abnormal EKG with ST elevation in Lead II, and at that time, a Code STEMI was called and she underwent diagnostic catheterization revealing nonobstructive dzs.  Pt subsequently r/o for MI and was later discharged home.  On 12/17, pt had recurrent left sided chest discomfort, described as sharp and shooting, assoc with belching, lasting a few seconds at a time, and resolving spontaneously.  Symptoms recurred frequently and pt called EMS.  EKG was notable for ST elevation in Lead II.  Patient was taken to the [facility] ED and a Code STEMI was called.  Pt was pain free in the ED and after review of current and old EKG's Code STEMI was called off and pt was admitted for evaluation.  Hospital Course   Pt had no further chest pain and cardiac enzymes have been negative.  We have given her a copy of her EKG to take home and keep on her @ all times.  Discharge Vitals:  Blood pressure 107/70, pulse 46, temperature 98.4 F (36.9 C), temperature source Oral, resp. rate 19, height 5\' 6"  (1.676 m), weight  152 lb 12.8 oz (69.31 kg), SpO2 100.00%.   Labs: CBC:  Basename 03/07/11 1722 03/07/11 1452  WBC 4.9 4.9  NEUTROABS -- 2.4  HGB 11.9* 12.2  HCT 35.4* 35.8*  MCV 102.0* 101.7*  PLT 153 165   Basic Metabolic Panel:  Basename 03/07/11 1722 03/07/11 1452  NA 140 139  K 4.3 4.7  CL 105 106  CO2 25 25  GLUCOSE 79 109*  BUN 19 19  CREATININE 1.13* 1.17*  CALCIUM 9.1 9.3  MG -- --  PHOS -- --   Liver Function Tests:  [facility] 03/07/11 1722  AST 32  ALT 19  ALKPHOS 59  BILITOT 0.5  PROT 6.9  ALBUMIN 3.6   Cardiac Enzymes:  Basename 03/08/11 0004 03/07/11 1722 03/07/11 1451  CKTOTAL 78 80 --  CKMB 2.2 2.5 --  CKMBINDEX -- -- --  TROPONINI <0.30 <0.30 <0.30   Thyroid Function Tests:  Basename 03/07/11 1722  TSH 1.408  T4TOTAL --  T3FREE --  THYROIDAB --    Disposition:   Pt will be discharged today in good condition.  Follow-up Information    Follow up with Sanda Linger, MD. (As needed)       Follow up with Rollene Rotunda, MD. (as scheduled)    Contact information:   [address] [place] [address] [phone]       Follow up with [facility]. (as scheduled)  Discharge Medications: Current Discharge Medication List    CONTINUE these medications which have NOT CHANGED   Details  amiodarone (PACERONE) 200 MG tablet Take 1 tablet (200 mg total) by mouth daily. Qty: 90 tablet, Refills: 4    aspirin 81 MG EC tablet Take 81 mg by mouth daily.      carvedilol (COREG) 12.5 MG tablet Take 1 tablet (12.5 mg total) by mouth 2 (two) times daily. Qty: 180 tablet, Refills: 4   Associated Diagnoses: Chronic systolic heart failure; Coronary atherosclerosis of native coronary artery    cyclobenzaprine (FLEXERIL) 5 MG tablet Take 5 mg by mouth 2 (two) times daily as needed. For pain    ezetimibe (ZETIA) 10 MG tablet Take 1 tablet (10 mg total) by mouth daily. Qty: 30 tablet,  Refills: 11   Associated Diagnoses: Coronary atherosclerosis of native coronary artery    fexofenadine (ALLEGRA) 180 MG tablet Take 180 mg by mouth daily.     fish oil-omega-3 fatty acids 1000 MG capsule Take 1,000 mg by mouth daily.      fluticasone (FLONASE) 50 MCG/ACT nasal spray Place 2 sprays into the nose daily as needed. For allergies      folic acid-pyridoxine-cyancobalamin (FOLTX) 2.5-25-2 MG TABS Take 1 tablet by mouth daily.      furosemide (LASIX) 20 MG tablet Take 20 mg by mouth daily.      HYDROcodone-acetaminophen (NORCO) 5-325 MG per tablet Take 1 tablet by mouth every 4 (four) hours as needed. For pain     levothyroxine (SYNTHROID) 150 MCG tablet Take 1 tablet (150 mcg total) by mouth daily. Qty: 90 tablet, Refills: 3   Associated Diagnoses: Unspecified hypothyroidism    lisinopril (PRINIVIL,ZESTRIL) 2.5 MG tablet Take 2.5 mg by mouth 2 (two) times daily.      methocarbamol (ROBAXIN) 500 MG tablet Take 500 mg by mouth 4 (four) times daily as needed. For spasms     metolazone (ZAROXOLYN) 2.5 MG tablet Take 2.5 mg by mouth daily as needed. For heavy duty fluid build up.     nitroGLYCERIN (NITROSTAT) 0.4 MG SL tablet Place 0.4 mg under the tongue every 5 (five) minutes as needed. For chest pain     oxyCODONE-acetaminophen (PERCOCET) 5-325 MG per tablet Take 1 tablet by mouth every 4 (four) hours as needed. For pain     pantoprazole (PROTONIX) 40 MG tablet Take 40 mg by mouth daily.      potassium chloride SA (K-DUR,KLOR-CON) 20 MEQ tablet Take 20 mEq by mouth daily.      warfarin (COUMADIN) 3 MG tablet Take 3 mg by mouth daily.       STOP taking these medications              Outstanding Labs/Studies  INR as scheduled in coumadin clinic  Duration of Discharge Encounter: Greater than 30 minutes including physician time.  Signed, Nicolasa Ducking NP 03/08/2011, 9:47 AM

## 2011-03-08 NOTE — Progress Notes (Signed)
Discharge instructions reviewed with patient and she stated her understanding.  Discussed daily weights and low sodium diet along with when to notify MD.  IV's discontinued with caths intact.  Patient discharged via wheelchair by volunteers.  Colman Cater

## 2011-03-08 NOTE — Progress Notes (Signed)
Patient ID: [name], female   DOB: 1950-07-19, 60 y.o.   MRN: [id number]    SUBJECTIVE: Chest/epigastric pain has resolved.  She ruled out for MI.    Filed Vitals:   03/07/11 1547 03/07/11 1700 03/07/11 2100 03/08/11 0500  BP: 133/61 146/73 135/77 107/70  Pulse: 51 57 70 46  Temp: 99.1 F (37.3 C) 98 F (36.7 C) 98 F (36.7 C) 98.4 F (36.9 C)  TempSrc: Oral Oral Oral Oral  Resp: 18 20 20 19   Height:  5\' 6"  (1.676 m)    Weight:  70.398 kg (155 lb 3.2 oz)  69.31 kg (152 lb 12.8 oz)  SpO2: 100% 98% 96% 100%   No intake or output data in the 24 hours ending 03/08/11 0758  LABS: Basic Metabolic Panel:  Basename 03/07/11 1722 03/07/11 1452  NA 140 139  K 4.3 4.7  CL 105 106  CO2 25 25  GLUCOSE 79 109*  BUN 19 19  CREATININE 1.13* 1.17*  CALCIUM 9.1 9.3  MG -- --  PHOS -- --   Liver Function Tests:  [facility] 03/07/11 1722  AST 32  ALT 19  ALKPHOS 59  BILITOT 0.5  PROT 6.9  ALBUMIN 3.6   No results found for this basename: LIPASE:2,AMYLASE:2 in the last 72 hours CBC:  Basename 03/07/11 1722 03/07/11 1452  WBC 4.9 4.9  NEUTROABS -- 2.4  HGB 11.9* 12.2  HCT 35.4* 35.8*  MCV 102.0* 101.7*  PLT 153 165   Cardiac Enzymes:  Basename 03/08/11 0004 03/07/11 1722 03/07/11 1451  CKTOTAL 78 80 --  CKMB 2.2 2.5 --  CKMBINDEX -- -- --  TROPONINI <0.30 <0.30 <0.30   Thyroid Function Tests:  Basename 03/07/11 1722  TSH 1.408  T4TOTAL --  T3FREE --  THYROIDAB --      . amiodarone  200 mg Oral Daily  . aspirin EC  81 mg Oral Daily  . carvedilol  12.5 mg Oral BID AC  . ezetimibe  10 mg Oral Daily  . fish oil-omega-3 fatty acids  1,000 mg Oral Daily  . folic acid-pyridoxine-cyancobalamin  1 tablet Oral Daily  . furosemide  20 mg Oral Daily  . levothyroxine  150 mcg Oral Daily  . lisinopril  2.5 mg Oral BID  . loratadine  10 mg Oral Daily  . pantoprazole  40 mg Oral Daily  . potassium chloride SA  20 mEq Oral Daily  . sodium chloride  3 mL  Intravenous Q12H  . warfarin  3 mg Oral NOW     RADIOLOGY: Dg Chest Portable 1 View  03/07/2011  *RADIOLOGY REPORT*  Clinical Data: Chest pain  PORTABLE CHEST - 1 VIEW  Comparison: 12/29/2010  Findings: Borderline cardiomegaly noted.  Prior vertebroplasty upper thoracic spine again noted.  No acute infiltrate or pleural effusion.  No pulmonary edema. Single lead cardiac pacemaker with tip in right ventricle.  IMPRESSION: No active disease.  Prior vertebral plasty upper thoracic spine.  Original Report Authenticated By: Natasha Mead, M.D.    PHYSICAL EXAM General: NAD Neck: No JVD, no thyromegaly or thyroid nodule.  Lungs: Clear to auscultation bilaterally with normal respiratory effort. CV: Nondisplaced PMI.  Heart regular S1/S2, no S3/S4, no murmur.  No peripheral edema.  No carotid bruit.  Normal pedal pulses.  Abdomen: Soft, nontender, no hepatosplenomegaly, no distention.  Neurologic: Alert and oriented x 3.  Psych: Normal affect. Extremities: No clubbing or cyanosis.    ASSESSMENT AND PLAN:  1. Chest Pain/Abnormal EKG: Pt  presented with recurrent, atypical, intermittent left sided chest pain. EKG is not acutely different and we need to be sure that she is given a copy of her EKG prior to d/c. She has ruled out for MI.  ? Gastritis given GI character of her symptoms. - OK for discharge today, will need PPI.  2. ICM/Chronic Syst CHF: Euvolemic. S/p ICD. Cont BB/Acei.  3. A. Fib: In sinus. Cont amio/coumadin.  4. HTN: Resume home meds and follow.  5. HL: Cont zetia. 6. Disposition: Home today.  Same meds but add PPI, omeprazole 40.  Needs to followup with GI (reschedule with Dr. Bosie Clos.   Marca Ancona 03/08/2011 8:00 AM

## 2011-03-11 ENCOUNTER — Ambulatory Visit: Payer: Medicare Other | Admitting: Cardiology

## 2011-03-29 ENCOUNTER — Encounter: Payer: Self-pay | Admitting: Cardiology

## 2011-04-12 ENCOUNTER — Ambulatory Visit: Payer: Medicare Other | Admitting: Cardiology

## 2011-04-19 ENCOUNTER — Telehealth: Payer: Self-pay | Admitting: Cardiology

## 2011-04-19 NOTE — Telephone Encounter (Signed)
[name] with Dr. Marge Duncans office calling for ok on pt stopping Coumadin 5 days prior to procedure  On 04-25-11

## 2011-04-19 NOTE — Telephone Encounter (Signed)
Will need to ask Dr Antoine Poche.  Will forward to him for review

## 2011-04-19 NOTE — Telephone Encounter (Signed)
She has multiple issues.  Would it be possible just to consider a diagnostic only on coumadin.  I think she should be seen in the office first before taking her off of coumadin.

## 2011-04-19 NOTE — Telephone Encounter (Signed)
Pt will be scheduled for an appointment for the consideration of being off coumadin.  Paperwork faxed back to [employer] office for their information

## 2011-04-20 ENCOUNTER — Telehealth: Payer: Self-pay | Admitting: Cardiology

## 2011-04-20 NOTE — Telephone Encounter (Signed)
Left message for [name] that paperwork was faxed back yesterday that pt needs an appointment prior to coming off Coumadin.  She will call back if necessary.

## 2011-04-20 NOTE — Telephone Encounter (Signed)
Per Melissa from Dr. Bosie Clos pt needs to stop coumadin today for procedure on Monday please contact asap

## 2011-04-21 ENCOUNTER — Encounter: Payer: Self-pay | Admitting: Nurse Practitioner

## 2011-04-21 ENCOUNTER — Ambulatory Visit (INDEPENDENT_AMBULATORY_CARE_PROVIDER_SITE_OTHER): Payer: Medicare Other | Admitting: Pharmacist

## 2011-04-21 ENCOUNTER — Ambulatory Visit (INDEPENDENT_AMBULATORY_CARE_PROVIDER_SITE_OTHER): Payer: Medicare Other | Admitting: Nurse Practitioner

## 2011-04-21 VITALS — BP 138/80 | HR 78 | Ht 66.0 in | Wt 156.0 lb

## 2011-04-21 DIAGNOSIS — I2589 Other forms of chronic ischemic heart disease: Secondary | ICD-10-CM

## 2011-04-21 DIAGNOSIS — I255 Ischemic cardiomyopathy: Secondary | ICD-10-CM

## 2011-04-21 DIAGNOSIS — I2510 Atherosclerotic heart disease of native coronary artery without angina pectoris: Secondary | ICD-10-CM

## 2011-04-21 DIAGNOSIS — I4891 Unspecified atrial fibrillation: Secondary | ICD-10-CM

## 2011-04-21 NOTE — Patient Instructions (Signed)
We will check your coumadin level today.  You should be ok for your endoscopy on Monday.  I will send a note to Dr. Bosie Clos.  We will see you back in 2 months.  Call the [facility] office at [phone] if you have any questions, problems or concerns.

## 2011-04-21 NOTE — Assessment & Plan Note (Signed)
She is in sinus by physical exam. Remains on amiodarone.

## 2011-04-21 NOTE — Progress Notes (Signed)
[name] Date of Birth: [birth year] Medical Record #[id number]  History of Present Illness: [name] is seen back today for a work in visit. She is seen for Dr. Antoine Poche. She is here at the request of Dr. Bosie Clos. She is to have an endoscopy on Monday. She did stop her coumadin on her own as of yesterday. She has multiple medical issues which include CAD with prior MI. She has a chronically abnormal EKG. She had atypical chest pain back in November and had repeat cath showing nonobstructive disease.   She comes in today. She says she is doing well. No chest pain or shortness of breath. Weights have been stable. No dizziness. Tolerating her medicines. She is now having more issues with gas and belching. She did have her colostomy reversed back in July. She is for EGD on Monday to further evaluate. She continues with chronic back pain but is currently working with rehab. No adverse bleeding or bruising and no cardiac complaints. No ICD shocks.   Current Outpatient Prescriptions on File Prior to Visit  Medication Sig Dispense Refill  . amiodarone (PACERONE) 200 MG tablet Take 1 tablet (200 mg total) by mouth daily.  90 tablet  4  . aspirin 81 MG EC tablet Take 81 mg by mouth daily.        . carvedilol (COREG) 12.5 MG tablet Take 1 tablet (12.5 mg total) by mouth 2 (two) times daily.  180 tablet  4  . cyclobenzaprine (FLEXERIL) 5 MG tablet Take 5 mg by mouth 2 (two) times daily as needed. For pain      . ezetimibe (ZETIA) 10 MG tablet Take 1 tablet (10 mg total) by mouth daily.  30 tablet  11  . fexofenadine (ALLEGRA) 180 MG tablet Take 180 mg by mouth daily.       . fish oil-omega-3 fatty acids 1000 MG capsule Take 1,000 mg by mouth daily.        . fluticasone (FLONASE) 50 MCG/ACT nasal spray Place 2 sprays into the nose daily as needed. For allergies        . folic acid-pyridoxine-cyancobalamin (FOLTX) 2.5-25-2 MG TABS Take 1 tablet by mouth daily.        . furosemide (LASIX) 20 MG tablet  Take 20 mg by mouth daily.        [name] HYDROcodone-acetaminophen (NORCO) 5-325 MG per tablet Take 1 tablet by mouth every 4 (four) hours as needed. For pain       . levothyroxine (SYNTHROID) 150 MCG tablet Take 1 tablet (150 mcg total) by mouth daily.  90 tablet  3  . lisinopril (PRINIVIL,ZESTRIL) 2.5 MG tablet Take 2.5 mg by mouth 2 (two) times daily.        . methocarbamol (ROBAXIN) 500 MG tablet Take 500 mg by mouth 4 (four) times daily as needed. For spasms       . metolazone (ZAROXOLYN) 2.5 MG tablet Take 2.5 mg by mouth daily as needed. For heavy duty fluid build up.       . nitroGLYCERIN (NITROSTAT) 0.4 MG SL tablet Place 0.4 mg under the tongue every 5 (five) minutes as needed. For chest pain       . oxyCODONE-acetaminophen (PERCOCET) 5-325 MG per tablet Take 1 tablet by mouth every 4 (four) hours as needed. For pain       . pantoprazole (PROTONIX) 40 MG tablet Take 40 mg by mouth daily.        . potassium chloride SA (K-DUR,KLOR-CON) 20  MEQ tablet Take 20 mEq by mouth daily.        [name] warfarin (COUMADIN) 3 MG tablet Take 3 mg by mouth daily.       . methocarbamol (ROBAXIN) 500 MG tablet Take 500 mg by mouth every 6 (six) hours as needed. For muscle spasms         Allergies  Allergen Reactions  . Penicillins     Throat swells  . Prevacid     Severe nausea  . Statins     cramps    Past Medical History  Diagnosis Date  . Spinal stenosis   . Numbness of foot     left foot  . Diverticulitis     Status post partial colectomy 1/12 with reversal in July 2012  . Colitis, ischemic   . DJD (degenerative joint disease)     History of multiple surgeries to the back, shoulder and knee  . Ischemic cardiomyopathy     Echo 06/16/10: EF 25-30%, anteroseptal and apical hypokinesis, moderate AI, mild MR  . Chronic systolic heart failure   . CAD (coronary artery disease)     a.  Ant MI 8/03 with stenting of the LAD;  b. staged PCI with Cypher DES to OM1 8/03;  c. s/p Cypher DES to LAD 7/04;  d.   multiple cardiac caths in past (chronically abnl ECG);    e. cardiac catheterization 3/12: LAD stent patent, distal LAD 40-50%, small ostial D1 80%, ostial D2 60%, OM-1 stent patent (20-30%), proximal RCA 50%, proximal to mid RCA 40-50%; f. cath 02/17/2011 - nonobs  . LV (left ventricular) mural thrombus     Chronic Coumadin therapy  . Stroke     History of TIA and possible CVA; hospitalized 2004; on coumadin  . CKD (chronic kidney disease), stage III     creatinine:  1.3 in 4/12;    . Tobacco abuse     history  . GERD (gastroesophageal reflux disease)   . Hypertension   . Lower GI bleed     August 2011  . SVT (supraventricular tachycardia)     ? h/o AFib;  patient on long term amiodarone therapy  . Hypothyroidism   . Aortic insufficiency   . Pseudoaneurysm     History of, right forearm  . ICD (implantable cardiac defibrillator), single, in situ     [employer];  2008; followed by Dr. Graciela Husbands.   [name] Hyperlipidemia   . OA (osteoarthritis)   . Abnormal EKG     Chronically abnormal EKG.   . Spinal stenosis   . Numbness of foot     Left foot    Past Surgical History  Procedure Date  . Cardiac catheterization     Has had remote anterior MI with stenting of the LAD and LCX in 2003. Has had multiple repeat caths.   . Insert / replace / remove pacemaker   . Icd   . Back surgery   . Shoulder surgery   . Knee surgery   . Colon surgery   . Spine surgery     History  Smoking status  . Former Smoker  . Quit date: 03/22/1995  Smokeless tobacco  . Never Used  Comment: quit in 1992    History  Alcohol Use No    Family History  Problem Relation Age of Onset  . Diabetes Mother   . Diabetes Brother   . Arthritis      family history  . Prostate cancer  Family History    Review of Systems: The review of systems is positive for GI issues. No cardiac complaints. She did stop her coumadin yesterday. No recent INR noted.  All other systems were reviewed and are  negative.  Physical Exam: BP 138/80  Pulse 78  Ht 5\' 6"  (1.676 m)  Wt 156 lb (70.761 kg)  BMI 25.18 kg/m2 Patient is pleasant and in no acute distress. Skin is warm and dry. Color is normal.  HEENT is unremarkable. Normocephalic/atraumatic. PERRL. Sclera are nonicteric. Neck is supple. No masses. No JVD. Lungs are clear. Cardiac exam shows a regular rate and rhythm. Abdomen is soft. Extremities are without edema. Gait and ROM are intact. No gross neurologic deficits noted.  LABORATORY DATA:   Assessment / Plan:

## 2011-04-21 NOTE — Assessment & Plan Note (Signed)
She currently looks compensated. No change with her current medicines.

## 2011-04-21 NOTE — Assessment & Plan Note (Signed)
She has had recent cath back in November showing nonobstructive disease. She is doing well from our standpoint. Should be an acceptable candidate for EGD on Monday with Dr. Bosie Clos. Will check her INR today. We would like to see her back in about 2 months for followup. Patient is agreeable to this plan and will call if any problems develop in the interim.

## 2011-04-25 ENCOUNTER — Other Ambulatory Visit (HOSPITAL_COMMUNITY)
Admission: RE | Admit: 2011-04-25 | Discharge: 2011-04-25 | Disposition: A | Discharge status: Home / Self Care | Payer: Medicare Other | Source: Ambulatory Visit | Attending: Gastroenterology | Admitting: Gastroenterology

## 2011-04-25 ENCOUNTER — Other Ambulatory Visit: Payer: Self-pay | Admitting: Gastroenterology

## 2011-04-25 DIAGNOSIS — B379 Candidiasis, unspecified: Secondary | ICD-10-CM | POA: Insufficient documentation

## 2011-04-25 DIAGNOSIS — B009 Herpesviral infection, unspecified: Secondary | ICD-10-CM | POA: Insufficient documentation

## 2011-05-02 ENCOUNTER — Ambulatory Visit (INDEPENDENT_AMBULATORY_CARE_PROVIDER_SITE_OTHER): Payer: Medicare Other | Admitting: *Deleted

## 2011-05-02 DIAGNOSIS — I255 Ischemic cardiomyopathy: Secondary | ICD-10-CM

## 2011-05-02 DIAGNOSIS — I2589 Other forms of chronic ischemic heart disease: Secondary | ICD-10-CM

## 2011-05-23 ENCOUNTER — Encounter: Payer: Self-pay | Admitting: Endocrinology

## 2011-05-23 ENCOUNTER — Ambulatory Visit (INDEPENDENT_AMBULATORY_CARE_PROVIDER_SITE_OTHER): Payer: Medicare Other | Admitting: Endocrinology

## 2011-05-23 ENCOUNTER — Other Ambulatory Visit (INDEPENDENT_AMBULATORY_CARE_PROVIDER_SITE_OTHER): Payer: Medicare Other

## 2011-05-23 VITALS — BP 126/72 | HR 56 | Temp 97.2°F | Ht 66.0 in | Wt 159.4 lb

## 2011-05-23 DIAGNOSIS — F411 Generalized anxiety disorder: Secondary | ICD-10-CM

## 2011-05-23 DIAGNOSIS — IMO0002 Reserved for concepts with insufficient information to code with codable children: Secondary | ICD-10-CM

## 2011-05-23 LAB — URINALYSIS, ROUTINE W REFLEX MICROSCOPIC
Leukocytes, UA: NEGATIVE
Nitrite: NEGATIVE
Urobilinogen, UA: 0.2 (ref 0.0–1.0)

## 2011-05-23 MED ORDER — BUSPIRONE HCL 15 MG PO TABS: 15.0000 mg | ORAL_TABLET | Freq: Three times a day (TID) | ORAL | Status: DC

## 2011-05-23 NOTE — Progress Notes (Signed)
Subjective:    Patient ID: [name], female    DOB: 10-29-1950, 61 y.o.   MRN: [id number]  HPI Pt states few days of slight palpitions in the chest, and associated anxiety, in the context of a friend's illness.  On further discussion, she states she has long-term anxiety sxs.   Pt also states darker than usual, and foul-smelling urine. She also has belching.   Past Medical History  Diagnosis Date  . Spinal stenosis   . Numbness of foot     left foot  . Diverticulitis     Status post partial colectomy 1/12 with reversal in July 2012  . Colitis, ischemic   . DJD (degenerative joint disease)     History of multiple surgeries to the back, shoulder and knee  . Ischemic cardiomyopathy     Echo 06/16/10: EF 25-30%, anteroseptal and apical hypokinesis, moderate AI, mild MR  . Chronic systolic heart failure   . CAD (coronary artery disease)     a.  Ant MI 8/03 with stenting of the LAD;  b. staged PCI with Cypher DES to OM1 8/03;  c. s/p Cypher DES to LAD 7/04;  d.  multiple cardiac caths in past (chronically abnl ECG);    e. cardiac catheterization 3/12: LAD stent patent, distal LAD 40-50%, small ostial D1 80%, ostial D2 60%, OM-1 stent patent (20-30%), proximal RCA 50%, proximal to mid RCA 40-50%; f. cath 02/17/2011 - nonobs  . LV (left ventricular) mural thrombus     Chronic Coumadin therapy  . Stroke     History of TIA and possible CVA; hospitalized 2004; on coumadin  . CKD (chronic kidney disease), stage III     creatinine:  1.3 in 4/12;    . Tobacco abuse     history  . GERD (gastroesophageal reflux disease)   . Hypertension   . Lower GI bleed     August 2011  . SVT (supraventricular tachycardia)     ? h/o AFib;  patient on long term amiodarone therapy  . Hypothyroidism   . Aortic insufficiency   . Pseudoaneurysm     History of, right forearm  . ICD (implantable cardiac defibrillator), single, in situ     [employer];  2008; followed by Dr. Graciela Husbands.   [name] Hyperlipidemia    . OA (osteoarthritis)   . Abnormal EKG     Chronically abnormal EKG.   . Spinal stenosis   . Numbness of foot     Left foot    Past Surgical History  Procedure Date  . Cardiac catheterization     Has had remote anterior MI with stenting of the LAD and LCX in 2003. Has had multiple repeat caths.   . Insert / replace / remove pacemaker   . Icd   . Back surgery   . Shoulder surgery   . Knee surgery   . Colon surgery   . Spine surgery     History   Social History  . Marital Status: Widowed    Spouse Name: N/A    Number of Children: N/A  . Years of Education: N/A   Occupational History  . Not on file.   Social History Main Topics  . Smoking status: Former Smoker    Quit date: 03/22/1995  . Smokeless tobacco: Never Used   Comment: quit in 1992  . Alcohol Use: No  . Drug Use: No  . Sexually Active: No   Other Topics Concern  . Not on file  Social History Narrative  . No narrative on file  lives alone.  Here with brother.    Current Outpatient Prescriptions on File Prior to Visit  Medication Sig Dispense Refill  . amiodarone (PACERONE) 200 MG tablet Take 1 tablet (200 mg total) by mouth daily.  90 tablet  4  . aspirin 81 MG EC tablet Take 81 mg by mouth daily.        . carvedilol (COREG) 12.5 MG tablet Take 1 tablet (12.5 mg total) by mouth 2 (two) times daily.  180 tablet  4  . cyclobenzaprine (FLEXERIL) 5 MG tablet Take 5 mg by mouth 2 (two) times daily as needed. For pain      . ezetimibe (ZETIA) 10 MG tablet Take 1 tablet (10 mg total) by mouth daily.  30 tablet  11  . fexofenadine (ALLEGRA) 180 MG tablet Take 180 mg by mouth daily.       . fish oil-omega-3 fatty acids 1000 MG capsule Take 1,000 mg by mouth daily.        . fluticasone (FLONASE) 50 MCG/ACT nasal spray Place 2 sprays into the nose daily as needed. For allergies        . folic acid-pyridoxine-cyancobalamin (FOLTX) 2.5-25-2 MG TABS Take 1 tablet by mouth daily.        . furosemide (LASIX) 20 MG  tablet Take 20 mg by mouth daily.        [name] HYDROcodone-acetaminophen (NORCO) 5-325 MG per tablet Take 1 tablet by mouth every 4 (four) hours as needed. For pain       . levothyroxine (SYNTHROID) 150 MCG tablet Take 1 tablet (150 mcg total) by mouth daily.  90 tablet  3  . lisinopril (PRINIVIL,ZESTRIL) 2.5 MG tablet Take 2.5 mg by mouth 2 (two) times daily.        . methocarbamol (ROBAXIN) 500 MG tablet Take 500 mg by mouth 4 (four) times daily as needed. For spasms       . metolazone (ZAROXOLYN) 2.5 MG tablet Take 2.5 mg by mouth daily as needed. For heavy duty fluid build up.       . nitroGLYCERIN (NITROSTAT) 0.4 MG SL tablet Place 0.4 mg under the tongue every 5 (five) minutes as needed. For chest pain       . oxyCODONE-acetaminophen (PERCOCET) 5-325 MG per tablet Take 1 tablet by mouth every 4 (four) hours as needed. For pain       . pantoprazole (PROTONIX) 40 MG tablet Take 40 mg by mouth 2 (two) times daily.       . potassium chloride SA (K-DUR,KLOR-CON) 20 MEQ tablet Take 20 mEq by mouth daily.        [name] warfarin (COUMADIN) 3 MG tablet Take 3 mg by mouth daily.       . methocarbamol (ROBAXIN) 500 MG tablet Take 500 mg by mouth every 6 (six) hours as needed. For muscle spasms         Allergies  Allergen Reactions  . Penicillins     Throat swells  . Prevacid     Severe nausea  . Statins     cramps    Family History  Problem Relation Age of Onset  . Diabetes Mother   . Diabetes Brother   . Arthritis      family history  . Prostate cancer      Family History    BP 126/72  Pulse 56  Temp(Src) 97.2 F (36.2 C) (Oral)  Ht 5\' 6"  (1.676  m)  Wt 159 lb 6.4 oz (72.303 kg)  BMI 25.73 kg/m2  SpO2 99%    Review of Systems Denies fever, but she has insomnia.      Objective:   Physical Exam VITAL SIGNS:  See vs page GENERAL: no distress LUNGS:  Clear to auscultation HEART:  Regular rate and rhythm without murmurs noted. Normal S1,S2.   ABDOMEN: no suprapubic tenderness.     PSYCH: Alert and oriented x 3.  Does not appear anxious nor depressed.          Assessment & Plan:  Anxiety, new Urinary sxs, uncertain etiology Belching, uncertain etiology.  We can watch this for now.

## 2011-05-23 NOTE — Patient Instructions (Addendum)
A urine test is requested for you today.  please call [phone] to hear your test results.  You will be prompted to enter the 9-digit "MRN" number that appears at the top left of this page, followed by #.  Then you will hear the message. i have sent a prescription to your pharmacy, to help the anxiety symptoms.   I hope you feel better soon.  If you don't feel better soon, please call dr Yetta Barre.  The medication can be doubled if necessary.

## 2011-05-30 ENCOUNTER — Other Ambulatory Visit: Payer: Self-pay | Admitting: Cardiology

## 2011-05-30 MED ORDER — WARFARIN SODIUM 3 MG PO TABS: ORAL_TABLET | ORAL | Status: DC

## 2011-06-13 ENCOUNTER — Ambulatory Visit (INDEPENDENT_AMBULATORY_CARE_PROVIDER_SITE_OTHER): Payer: Medicare Other | Admitting: Cardiology

## 2011-06-13 ENCOUNTER — Encounter: Payer: Self-pay | Admitting: Cardiology

## 2011-06-13 VITALS — BP 120/75 | HR 63 | Ht 66.0 in | Wt 159.0 lb

## 2011-06-13 DIAGNOSIS — I4891 Unspecified atrial fibrillation: Secondary | ICD-10-CM

## 2011-06-13 DIAGNOSIS — I6529 Occlusion and stenosis of unspecified carotid artery: Secondary | ICD-10-CM

## 2011-06-13 DIAGNOSIS — I2589 Other forms of chronic ischemic heart disease: Secondary | ICD-10-CM

## 2011-06-13 DIAGNOSIS — I2510 Atherosclerotic heart disease of native coronary artery without angina pectoris: Secondary | ICD-10-CM

## 2011-06-13 DIAGNOSIS — I255 Ischemic cardiomyopathy: Secondary | ICD-10-CM

## 2011-06-13 DIAGNOSIS — Z7901 Long term (current) use of anticoagulants: Secondary | ICD-10-CM

## 2011-06-13 DIAGNOSIS — I10 Essential (primary) hypertension: Secondary | ICD-10-CM

## 2011-06-13 MED ORDER — EZETIMIBE 10 MG PO TABS: 10.0000 mg | ORAL_TABLET | Freq: Every day | ORAL | Status: DC

## 2011-06-13 NOTE — Assessment & Plan Note (Signed)
She seems to be euvolemic.  At this point, no change in therapy is indicated.  We have reviewed salt and fluid restrictions.  No further cardiovascular testing is indicated.   

## 2011-06-13 NOTE — Assessment & Plan Note (Signed)
The blood pressure is at target. No change in medications is indicated. We will continue with therapeutic lifestyle changes (TLC).  

## 2011-06-13 NOTE — Assessment & Plan Note (Signed)
She missed a warfarin appointment and I will check her labs today.

## 2011-06-13 NOTE — Patient Instructions (Signed)
Follow up in 3 months with [name]  Please have blood work today.  The current medical regimen is effective;  continue present plan and medications.

## 2011-06-13 NOTE — Assessment & Plan Note (Signed)
I don't think that any of her recent symptoms are cardiac.  No further imaging is indicated.  She will follow with her gastroenterologist. She will otherwise continue the meds as listed with risk reduction.

## 2011-06-13 NOTE — Progress Notes (Signed)
HPI The patient presents for followup of her ischemic cardiomyopathy. She was previously seen by Dr. Deborah Chalk.  Since I last saw her she had another cath for chest pain.  She had small vessel disease but no significant large vessel disease and patent LAD stents. The etiology of her chest discomfort was not to be cardiac. She has seen gastroenterologist and has had a workup. He's been treated medically for apparent reflux. She said she's had a couple of severe episodes of discomfort with a "gurgling in her chest". She will have discomfort and she'll take aspirin, Protonix, nitroglycerin and the symptoms will slowly resolve. These are all similar to the episode she had led to hospitalization in November and subsequent cath. She's doing physical therapy for her back. She's able to do this without bringing on symptoms. She has no shortness of, PND or orthopnea.  Allergies  Allergen Reactions  . Penicillins     Throat swells  . Prevacid     Severe nausea  . Statins     cramps    Current Outpatient Prescriptions  Medication Sig Dispense Refill  . amiodarone (PACERONE) 200 MG tablet Take 1 tablet (200 mg total) by mouth daily.  90 tablet  4  . aspirin 81 MG EC tablet Take 81 mg by mouth daily.        . busPIRone (BUSPAR) 15 MG tablet Take 1 tablet (15 mg total) by mouth 3 (three) times daily.  60 tablet  11  . carvedilol (COREG) 12.5 MG tablet Take 1 tablet (12.5 mg total) by mouth 2 (two) times daily.  180 tablet  4  . cyclobenzaprine (FLEXERIL) 5 MG tablet Take 5 mg by mouth 2 (two) times daily as needed. For pain      . ezetimibe (ZETIA) 10 MG tablet Take 1 tablet (10 mg total) by mouth daily.  30 tablet  11  . fexofenadine (ALLEGRA) 180 MG tablet Take 180 mg by mouth daily.       . fish oil-omega-3 fatty acids 1000 MG capsule Take 1,000 mg by mouth daily.        . fluticasone (FLONASE) 50 MCG/ACT nasal spray Place 2 sprays into the nose daily as needed. For allergies        . folic  acid-pyridoxine-cyancobalamin (FOLTX) 2.5-25-2 MG TABS Take 1 tablet by mouth daily.        . furosemide (LASIX) 20 MG tablet Take 20 mg by mouth daily.        [name] HYDROcodone-acetaminophen (NORCO) 5-325 MG per tablet Take 1 tablet by mouth every 4 (four) hours as needed. For pain       . levothyroxine (SYNTHROID) 150 MCG tablet Take 1 tablet (150 mcg total) by mouth daily.  90 tablet  3  . lisinopril (PRINIVIL,ZESTRIL) 2.5 MG tablet Take 2.5 mg by mouth 2 (two) times daily.        . methocarbamol (ROBAXIN) 500 MG tablet Take 500 mg by mouth 4 (four) times daily as needed. For spasms       . nitroGLYCERIN (NITROSTAT) 0.4 MG SL tablet Place 0.4 mg under the tongue every 5 (five) minutes as needed. For chest pain       . oxyCODONE-acetaminophen (PERCOCET) 5-325 MG per tablet Take 1 tablet by mouth every 4 (four) hours as needed. For pain       . pantoprazole (PROTONIX) 40 MG tablet Take 40 mg by mouth 2 (two) times daily.       . potassium chloride  SA (K-DUR,KLOR-CON) 20 MEQ tablet Take 20 mEq by mouth daily.        [name] warfarin (COUMADIN) 3 MG tablet Take as directed by anticoagulation clinic  90 tablet  1  . methocarbamol (ROBAXIN) 500 MG tablet Take 500 mg by mouth every 6 (six) hours as needed. For muscle spasms         Past Medical History  Diagnosis Date  . Spinal stenosis   . Numbness of foot     left foot  . Diverticulitis     Status post partial colectomy 1/12 with reversal in July 2012  . Colitis, ischemic   . DJD (degenerative joint disease)     History of multiple surgeries to the back, shoulder and knee  . Ischemic cardiomyopathy     Echo 06/16/10: EF 25-30%, anteroseptal and apical hypokinesis, moderate AI, mild MR  . Chronic systolic heart failure   . CAD (coronary artery disease)     a.  Ant MI 8/03 with stenting of the LAD;  b. staged PCI with Cypher DES to OM1 8/03;  c. s/p Cypher DES to LAD 7/04;  d.  multiple cardiac caths in past (chronically abnl ECG);    e. cardiac  catheterization 3/12: LAD stent patent, distal LAD 40-50%, small ostial D1 80%, ostial D2 60%, OM-1 stent patent (20-30%), proximal RCA 50%, proximal to mid RCA 40-50%; f. cath 02/17/2011 - nonobs  . LV (left ventricular) mural thrombus     Chronic Coumadin therapy  . Stroke     History of TIA and possible CVA; hospitalized 2004; on coumadin  . CKD (chronic kidney disease), stage III     creatinine:  1.3 in 4/12;    . Tobacco abuse     history  . GERD (gastroesophageal reflux disease)   . Hypertension   . Lower GI bleed     August 2011  . SVT (supraventricular tachycardia)     ? h/o AFib;  patient on long term amiodarone therapy  . Hypothyroidism   . Aortic insufficiency   . Pseudoaneurysm     History of, right forearm  . ICD (implantable cardiac defibrillator), single, in situ     [employer];  2008; followed by Dr. Graciela Husbands.   [name] Hyperlipidemia   . OA (osteoarthritis)   . Abnormal EKG     Chronically abnormal EKG.   . Spinal stenosis   . Numbness of foot     Left foot    Past Surgical History  Procedure Date  . Cardiac catheterization     Has had remote anterior MI with stenting of the LAD and LCX in 2003. Has had multiple repeat caths.   . Insert / replace / remove pacemaker   . Icd   . Back surgery   . Shoulder surgery   . Knee surgery   . Colon surgery   . Spine surgery     ROS:  As stated in the HPI and negative for all other systems.  PHYSICAL EXAM BP 120/75  Pulse 63  Ht 5\' 6"  (1.676 m)  Wt 159 lb (72.122 kg)  BMI 25.66 kg/m2 GENERAL:  Well appearing HEENT:  Pupils equal round and reactive, fundi not visualized, oral mucosa unremarkable NECK:  No jugular venous distention, waveform within normal limits, carotid upstroke brisk and symmetric, no bruits, no thyromegaly LYMPHATICS:  No cervical, inguinal adenopathy LUNGS:  Clear to auscultation bilaterally BACK:  No CVA tenderness CHEST:  Unremarkable, ICD HEART:  PMI not displaced or sustained,S1  and  S2 within normal limits, no S3, no S4, no clicks, no rubs, no murmurs ABD:  Flat, positive bowel sounds normal in frequency in pitch, no bruits, no rebound, no guarding, no midline pulsatile mass, no hepatomegaly, no splenomegaly EXT:  2 plus pulses throughout, no edema, no cyanosis no clubbing  EKG:  Sinus rhythm, old inferior infarct, poor anterior R wave progression, chronic ST elevation in the inferolateral leads unchanged from previous.  06/13/2011   ASSESSMENT AND PLAN

## 2011-06-14 ENCOUNTER — Ambulatory Visit (INDEPENDENT_AMBULATORY_CARE_PROVIDER_SITE_OTHER): Payer: Self-pay | Admitting: Cardiology

## 2011-06-14 DIAGNOSIS — I2589 Other forms of chronic ischemic heart disease: Secondary | ICD-10-CM

## 2011-06-14 DIAGNOSIS — I255 Ischemic cardiomyopathy: Secondary | ICD-10-CM

## 2011-07-05 ENCOUNTER — Encounter (HOSPITAL_COMMUNITY): Payer: Self-pay

## 2011-07-05 ENCOUNTER — Emergency Department (HOSPITAL_COMMUNITY): Payer: Medicare Other

## 2011-07-05 ENCOUNTER — Inpatient Hospital Stay (HOSPITAL_COMMUNITY)
Admission: EM | Admit: 2011-07-05 | Discharge: 2011-07-08 | DRG: 543 | Disposition: A | Discharge status: Rehabilitation Facility | Payer: Medicare Other | Attending: Internal Medicine | Admitting: Internal Medicine

## 2011-07-05 DIAGNOSIS — I2510 Atherosclerotic heart disease of native coronary artery without angina pectoris: Secondary | ICD-10-CM | POA: Diagnosis present

## 2011-07-05 DIAGNOSIS — Z96659 Presence of unspecified artificial knee joint: Secondary | ICD-10-CM

## 2011-07-05 DIAGNOSIS — Z7982 Long term (current) use of aspirin: Secondary | ICD-10-CM

## 2011-07-05 DIAGNOSIS — Z9861 Coronary angioplasty status: Secondary | ICD-10-CM

## 2011-07-05 DIAGNOSIS — M810 Age-related osteoporosis without current pathological fracture: Secondary | ICD-10-CM | POA: Diagnosis present

## 2011-07-05 DIAGNOSIS — K219 Gastro-esophageal reflux disease without esophagitis: Secondary | ICD-10-CM | POA: Diagnosis present

## 2011-07-05 DIAGNOSIS — I129 Hypertensive chronic kidney disease with stage 1 through stage 4 chronic kidney disease, or unspecified chronic kidney disease: Secondary | ICD-10-CM | POA: Diagnosis present

## 2011-07-05 DIAGNOSIS — Z7901 Long term (current) use of anticoagulants: Secondary | ICD-10-CM

## 2011-07-05 DIAGNOSIS — I2589 Other forms of chronic ischemic heart disease: Secondary | ICD-10-CM | POA: Diagnosis present

## 2011-07-05 DIAGNOSIS — E785 Hyperlipidemia, unspecified: Secondary | ICD-10-CM | POA: Diagnosis present

## 2011-07-05 DIAGNOSIS — I10 Essential (primary) hypertension: Secondary | ICD-10-CM | POA: Diagnosis present

## 2011-07-05 DIAGNOSIS — M549 Dorsalgia, unspecified: Secondary | ICD-10-CM | POA: Diagnosis present

## 2011-07-05 DIAGNOSIS — Z87891 Personal history of nicotine dependence: Secondary | ICD-10-CM

## 2011-07-05 DIAGNOSIS — E039 Hypothyroidism, unspecified: Secondary | ICD-10-CM | POA: Diagnosis present

## 2011-07-05 DIAGNOSIS — S32029A Unspecified fracture of second lumbar vertebra, initial encounter for closed fracture: Secondary | ICD-10-CM | POA: Diagnosis present

## 2011-07-05 DIAGNOSIS — I119 Hypertensive heart disease without heart failure: Secondary | ICD-10-CM | POA: Diagnosis present

## 2011-07-05 DIAGNOSIS — I4891 Unspecified atrial fibrillation: Secondary | ICD-10-CM | POA: Diagnosis present

## 2011-07-05 DIAGNOSIS — I5189 Other ill-defined heart diseases: Secondary | ICD-10-CM | POA: Diagnosis present

## 2011-07-05 DIAGNOSIS — I252 Old myocardial infarction: Secondary | ICD-10-CM

## 2011-07-05 DIAGNOSIS — Z95810 Presence of automatic (implantable) cardiac defibrillator: Secondary | ICD-10-CM

## 2011-07-05 DIAGNOSIS — I5022 Chronic systolic (congestive) heart failure: Secondary | ICD-10-CM | POA: Diagnosis present

## 2011-07-05 DIAGNOSIS — N189 Chronic kidney disease, unspecified: Secondary | ICD-10-CM | POA: Diagnosis present

## 2011-07-05 DIAGNOSIS — M8448XA Pathological fracture, other site, initial encounter for fracture: Principal | ICD-10-CM | POA: Diagnosis present

## 2011-07-05 DIAGNOSIS — Z8673 Personal history of transient ischemic attack (TIA), and cerebral infarction without residual deficits: Secondary | ICD-10-CM

## 2011-07-05 DIAGNOSIS — I5042 Chronic combined systolic (congestive) and diastolic (congestive) heart failure: Secondary | ICD-10-CM | POA: Diagnosis present

## 2011-07-05 DIAGNOSIS — Z981 Arthrodesis status: Secondary | ICD-10-CM

## 2011-07-05 DIAGNOSIS — N183 Chronic kidney disease, stage 3 unspecified: Secondary | ICD-10-CM | POA: Diagnosis present

## 2011-07-05 DIAGNOSIS — M1990 Unspecified osteoarthritis, unspecified site: Secondary | ICD-10-CM | POA: Diagnosis present

## 2011-07-05 DIAGNOSIS — Z79899 Other long term (current) drug therapy: Secondary | ICD-10-CM

## 2011-07-05 HISTORY — DX: Reserved for concepts with insufficient information to code with codable children: IMO0002

## 2011-07-05 HISTORY — DX: Cardiac murmur, unspecified: R01.1

## 2011-07-05 HISTORY — DX: Paroxysmal atrial fibrillation: I48.0

## 2011-07-05 HISTORY — DX: Anemia, unspecified: D64.9

## 2011-07-05 HISTORY — DX: Personal history of other diseases of the digestive system: Z87.19

## 2011-07-05 LAB — DIFFERENTIAL
Eosinophils Relative: 2 % (ref 0–5)
Lymphocytes Relative: 21 % (ref 12–46)
Lymphs Abs: 1.2 10*3/uL (ref 0.7–4.0)
Monocytes Relative: 13 % — ABNORMAL HIGH (ref 3–12)

## 2011-07-05 LAB — BASIC METABOLIC PANEL
BUN: 14 mg/dL (ref 6–23)
CO2: 23 mEq/L (ref 19–32)
Calcium: 8.5 mg/dL (ref 8.4–10.5)
Glucose, Bld: 80 mg/dL (ref 70–99)
Sodium: 135 mEq/L (ref 135–145)

## 2011-07-05 LAB — CBC
HCT: 37.7 % (ref 36.0–46.0)
Hemoglobin: 12.9 g/dL (ref 12.0–15.0)
MCH: 33.8 pg (ref 26.0–34.0)
MCV: 98.7 fL (ref 78.0–100.0)
Platelets: 131 10*3/uL — ABNORMAL LOW (ref 150–400)
RBC: 3.82 MIL/uL — ABNORMAL LOW (ref 3.87–5.11)
WBC: 5.7 10*3/uL (ref 4.0–10.5)

## 2011-07-05 LAB — PROTIME-INR: Prothrombin Time: 25.3 seconds — ABNORMAL HIGH (ref 11.6–15.2)

## 2011-07-05 MED ORDER — HYDROMORPHONE HCL PF 1 MG/ML IJ SOLN
0.5000 mg | INTRAMUSCULAR | Status: DC | PRN
  Administered 2011-07-05 – 2011-07-07 (×4): 0.5 mg via INTRAVENOUS
  Filled 2011-07-05 (×5): qty 1

## 2011-07-05 MED ORDER — LISINOPRIL 2.5 MG PO TABS
2.5000 mg | ORAL_TABLET | Freq: Two times a day (BID) | ORAL | Status: DC
  Administered 2011-07-06 – 2011-07-08 (×4): 2.5 mg via ORAL
  Filled 2011-07-05 (×8): qty 1

## 2011-07-05 MED ORDER — ONDANSETRON HCL 4 MG/2ML IJ SOLN: 4.0000 mg | Freq: Four times a day (QID) | INTRAMUSCULAR | Status: DC | PRN

## 2011-07-05 MED ORDER — EZETIMIBE 10 MG PO TABS
10.0000 mg | ORAL_TABLET | Freq: Every day | ORAL | Status: DC
  Administered 2011-07-06 – 2011-07-08 (×3): 10 mg via ORAL
  Filled 2011-07-05 (×3): qty 1

## 2011-07-05 MED ORDER — ACETAMINOPHEN 650 MG RE SUPP: 650.0000 mg | Freq: Four times a day (QID) | RECTAL | Status: DC | PRN

## 2011-07-05 MED ORDER — SODIUM CHLORIDE 0.9 % IV SOLN: INTRAVENOUS | Status: DC

## 2011-07-05 MED ORDER — FA-PYRIDOXINE-CYANOCOBALAMIN 2.5-25-2 MG PO TABS
1.0000 | ORAL_TABLET | Freq: Every day | ORAL | Status: DC
  Administered 2011-07-06 – 2011-07-08 (×3): 1 via ORAL
  Filled 2011-07-05 (×3): qty 1

## 2011-07-05 MED ORDER — HYDROMORPHONE HCL PF 2 MG/ML IJ SOLN
2.0000 mg | Freq: Once | INTRAMUSCULAR | Status: AC
  Administered 2011-07-05: 2 mg via INTRAMUSCULAR
  Filled 2011-07-05: qty 1

## 2011-07-05 MED ORDER — BUSPIRONE HCL 15 MG PO TABS
15.0000 mg | ORAL_TABLET | Freq: Every day | ORAL | Status: DC | PRN
  Filled 2011-07-05: qty 1

## 2011-07-05 MED ORDER — WARFARIN - PHARMACIST DOSING INPATIENT
Freq: Every day | Status: DC
  Administered 2011-07-07: 18:00:00

## 2011-07-05 MED ORDER — CALCIUM CARBONATE-VITAMIN D 500-200 MG-UNIT PO TABS
1.0000 | ORAL_TABLET | Freq: Two times a day (BID) | ORAL | Status: DC
  Administered 2011-07-06 – 2011-07-07 (×4): 1 via ORAL
  Filled 2011-07-05 (×8): qty 1

## 2011-07-05 MED ORDER — PANTOPRAZOLE SODIUM 40 MG PO TBEC
40.0000 mg | DELAYED_RELEASE_TABLET | Freq: Two times a day (BID) | ORAL | Status: DC
  Administered 2011-07-05 – 2011-07-08 (×6): 40 mg via ORAL
  Filled 2011-07-05 (×6): qty 1

## 2011-07-05 MED ORDER — ONDANSETRON 4 MG PO TBDP
8.0000 mg | ORAL_TABLET | Freq: Once | ORAL | Status: AC
  Administered 2011-07-05: 8 mg via ORAL
  Filled 2011-07-05: qty 2

## 2011-07-05 MED ORDER — NITROGLYCERIN 0.4 MG SL SUBL: 0.4000 mg | SUBLINGUAL_TABLET | SUBLINGUAL | Status: DC | PRN

## 2011-07-05 MED ORDER — CARVEDILOL 12.5 MG PO TABS
12.5000 mg | ORAL_TABLET | Freq: Two times a day (BID) | ORAL | Status: DC
  Administered 2011-07-08: 12.5 mg via ORAL
  Filled 2011-07-05 (×7): qty 1

## 2011-07-05 MED ORDER — POLYETHYLENE GLYCOL 3350 17 G PO PACK
17.0000 g | PACK | Freq: Every day | ORAL | Status: DC
  Administered 2011-07-06 – 2011-07-08 (×3): 17 g via ORAL
  Filled 2011-07-05 (×3): qty 1

## 2011-07-05 MED ORDER — SODIUM CHLORIDE 0.9 % IJ SOLN: 3.0000 mL | INTRAMUSCULAR | Status: DC | PRN

## 2011-07-05 MED ORDER — PROMETHAZINE HCL 25 MG/ML IJ SOLN
12.5000 mg | INTRAMUSCULAR | Status: AC
  Administered 2011-07-05: 12.5 mg via INTRAVENOUS
  Filled 2011-07-05: qty 1

## 2011-07-05 MED ORDER — FUROSEMIDE 20 MG PO TABS
20.0000 mg | ORAL_TABLET | Freq: Every day | ORAL | Status: DC
  Administered 2011-07-06 – 2011-07-08 (×3): 20 mg via ORAL
  Filled 2011-07-05 (×3): qty 1

## 2011-07-05 MED ORDER — SODIUM CHLORIDE 0.9 % IJ SOLN
3.0000 mL | Freq: Two times a day (BID) | INTRAMUSCULAR | Status: DC
  Administered 2011-07-06 (×2): 3 mL via INTRAVENOUS

## 2011-07-05 MED ORDER — LORATADINE 10 MG PO TABS
10.0000 mg | ORAL_TABLET | Freq: Every day | ORAL | Status: DC
  Administered 2011-07-06 – 2011-07-08 (×3): 10 mg via ORAL
  Filled 2011-07-05 (×3): qty 1

## 2011-07-05 MED ORDER — METHOCARBAMOL 500 MG PO TABS
500.0000 mg | ORAL_TABLET | Freq: Four times a day (QID) | ORAL | Status: DC
  Administered 2011-07-06 – 2011-07-08 (×11): 500 mg via ORAL
  Filled 2011-07-05 (×14): qty 1

## 2011-07-05 MED ORDER — SODIUM CHLORIDE 0.9 % IJ SOLN
3.0000 mL | Freq: Two times a day (BID) | INTRAMUSCULAR | Status: DC
  Administered 2011-07-06 – 2011-07-08 (×5): 3 mL via INTRAVENOUS

## 2011-07-05 MED ORDER — ASPIRIN EC 81 MG PO TBEC
81.0000 mg | DELAYED_RELEASE_TABLET | Freq: Every day | ORAL | Status: DC
  Administered 2011-07-06 – 2011-07-08 (×3): 81 mg via ORAL
  Filled 2011-07-05 (×3): qty 1

## 2011-07-05 MED ORDER — AMIODARONE HCL 200 MG PO TABS
200.0000 mg | ORAL_TABLET | Freq: Every day | ORAL | Status: DC
  Administered 2011-07-08: 200 mg via ORAL
  Filled 2011-07-05 (×3): qty 1

## 2011-07-05 MED ORDER — OMEGA-3-ACID ETHYL ESTERS 1 G PO CAPS
1000.0000 mg | ORAL_CAPSULE | Freq: Every day | ORAL | Status: DC
  Administered 2011-07-06 – 2011-07-08 (×3): 1000 mg via ORAL
  Filled 2011-07-05 (×4): qty 1

## 2011-07-05 MED ORDER — ACETAMINOPHEN 325 MG PO TABS
650.0000 mg | ORAL_TABLET | Freq: Four times a day (QID) | ORAL | Status: DC | PRN
  Filled 2011-07-05: qty 2

## 2011-07-05 MED ORDER — WARFARIN SODIUM 3 MG PO TABS
3.0000 mg | ORAL_TABLET | Freq: Every day | ORAL | Status: DC
  Administered 2011-07-05 – 2011-07-06 (×2): 3 mg via ORAL
  Filled 2011-07-05 (×3): qty 1

## 2011-07-05 MED ORDER — HYDROMORPHONE HCL PF 1 MG/ML IJ SOLN
0.5000 mg | Freq: Once | INTRAMUSCULAR | Status: DC
  Filled 2011-07-05: qty 1

## 2011-07-05 MED ORDER — POTASSIUM CHLORIDE CRYS ER 20 MEQ PO TBCR
20.0000 meq | EXTENDED_RELEASE_TABLET | Freq: Every day | ORAL | Status: DC
  Administered 2011-07-06 – 2011-07-08 (×3): 20 meq via ORAL
  Filled 2011-07-05 (×3): qty 1

## 2011-07-05 MED ORDER — OXYCODONE HCL 5 MG PO TABS
5.0000 mg | ORAL_TABLET | Freq: Four times a day (QID) | ORAL | Status: DC
  Administered 2011-07-05 – 2011-07-08 (×11): 5 mg via ORAL
  Filled 2011-07-05 (×11): qty 1

## 2011-07-05 MED ORDER — ONDANSETRON HCL 4 MG PO TABS: 4.0000 mg | ORAL_TABLET | Freq: Four times a day (QID) | ORAL | Status: DC | PRN

## 2011-07-05 MED ORDER — FLUTICASONE PROPIONATE 50 MCG/ACT NA SUSP
2.0000 | Freq: Every day | NASAL | Status: DC | PRN
  Filled 2011-07-05: qty 16

## 2011-07-05 MED ORDER — SODIUM CHLORIDE 0.9 % IV SOLN: 250.0000 mL | INTRAVENOUS | Status: DC | PRN

## 2011-07-05 MED ORDER — ONDANSETRON HCL 4 MG/2ML IJ SOLN
4.0000 mg | Freq: Once | INTRAMUSCULAR | Status: DC
  Filled 2011-07-05: qty 2

## 2011-07-05 MED ORDER — LEVOTHYROXINE SODIUM 150 MCG PO TABS
150.0000 ug | ORAL_TABLET | Freq: Every day | ORAL | Status: DC
  Administered 2011-07-06 – 2011-07-08 (×3): 150 ug via ORAL
  Filled 2011-07-05 (×4): qty 1

## 2011-07-05 MED ORDER — PROMETHAZINE HCL 25 MG/ML IJ SOLN
12.5000 mg | Freq: Four times a day (QID) | INTRAMUSCULAR | Status: DC | PRN
  Administered 2011-07-07 – 2011-07-08 (×2): 12.5 mg via INTRAVENOUS
  Filled 2011-07-05 (×3): qty 1

## 2011-07-05 NOTE — ED Notes (Signed)
Patient attempted to sit up and stated pain lower back 10/10 sharp pain. Patient placed back on stretcher.  States pain 2/10 achy dull pain lower back.  Resting comfortably.

## 2011-07-05 NOTE — ED Notes (Signed)
Attempted to call report to [facility] phone [phone]. Pt to be admitted to [phone]

## 2011-07-05 NOTE — ED Notes (Signed)
Pt. States that she was picking up something off the ground yesterday when she felt a "pop" in her lower back. States she made it to her bed but has been laying in bed since the incident. Reports "extreme" pain with movement but otherwise no pain. Took Robaxin at 2100 yesterday and applied heat without relief.

## 2011-07-05 NOTE — ED Notes (Signed)
Report called and accepted by [facility]

## 2011-07-05 NOTE — ED Notes (Signed)
Attempted to call report

## 2011-07-05 NOTE — ED Notes (Signed)
Patient is resting comfortably. 

## 2011-07-05 NOTE — H&P (Signed)
PCP:   Sanda Linger, MD, MD  Primary Neurosurgeon: Dr Phoebe Perch. Primary Cardiologist: Dr Antoine Poche.  Chief Complaint:  Fall and back pain today.  HPI: This is a 61 year old female, with known history of CAD, s/p AMI 10/2001, s/p Staged PCI-Cypher DES to 1-OM  10/2001, then Cypher DES to LAD 09/2002, ischemic CMP; EF 25%-30%, s/p [employer] ICD 2002, PAF, intramural LV thrombus, on chronic anticoagulation, TIA 2004, HTN, hypothyroidism, dyslipidemia, GERD, history of lower GI bleed, diverticulosis, s/p partial colectomy 03/2010/colostomy take-down 10/2010 , OA/spinal stenosis, s/p surgery with placement of hardware L3-4-L5-S1, in December 2012 by Dr. Phoebe Perch, CKD-3 (GFR 44-51; Baseline creatinine of 1.0-1.29 12/2010), presenting with above symptoms. Patient is an excellent historian, and according to her, she was feeling quite fine all day on 07/04/11, then at about 9:00PM, while at home, she bent over and picked up a heavy box, then felt a "pop" in her lower back, followed by excruciating pain, and could not straighten up. She somehow managed to make it into the bed, but couldn't get comfortable, and could not even change position, because of pain. She took some muscle relaxants to no avail, and called her brother by phone. He came over, and confirms that she was unable to sleep all night. In the morning of 07/05/11, she call Dr Doreen Beam office, and was advised to proceed to ED. She called EMS.  Allergies:   Allergies  Allergen Reactions  . Penicillins     Throat swells  . Prevacid     Severe nausea  . Statins     cramps      Past Medical History  Diagnosis Date  . Spinal stenosis   . Numbness of foot     left foot  . Diverticulitis     Status post partial colectomy 1/12 with reversal in July 2012  . Colitis, ischemic   . DJD (degenerative joint disease)     History of multiple surgeries to the back, shoulder and knee  . Ischemic cardiomyopathy     Echo 06/16/10: EF 25-30%, anteroseptal  and apical hypokinesis, moderate AI, mild MR  . Chronic systolic heart failure   . CAD (coronary artery disease)     a.  Ant MI 8/03 with stenting of the LAD;  b. staged PCI with Cypher DES to OM1 8/03;  c. s/p Cypher DES to LAD 7/04;  d.  multiple cardiac caths in past (chronically abnl ECG);    e. cardiac catheterization 3/12: LAD stent patent, distal LAD 40-50%, small ostial D1 80%, ostial D2 60%, OM-1 stent patent (20-30%), proximal RCA 50%, proximal to mid RCA 40-50%; f. cath 02/17/2011 - nonobs  . LV (left ventricular) mural thrombus     Chronic Coumadin therapy  . Stroke     History of TIA and possible CVA; hospitalized 2004; on coumadin  . CKD (chronic kidney disease), stage III     creatinine:  1.3 in 4/12;    . Tobacco abuse     history  . GERD (gastroesophageal reflux disease)   . Hypertension   . Lower GI bleed     August 2011  . SVT (supraventricular tachycardia)     ? h/o AFib;  patient on long term amiodarone therapy  . Hypothyroidism   . Aortic insufficiency   . Pseudoaneurysm     History of, right forearm  . ICD (implantable cardiac defibrillator), single, in situ     [employer];  2008; followed by Dr. Graciela Husbands.   [name] Hyperlipidemia   .  OA (osteoarthritis)   . Abnormal EKG     Chronically abnormal EKG.   . Spinal stenosis   . Numbness of foot     Left foot    Past Surgical History  Procedure Date  . Cardiac catheterization     Has had remote anterior MI with stenting of the LAD and LCX in 2003. Has had multiple repeat caths.   . Insert / replace / remove pacemaker   . Icd   . Back surgery   . Shoulder surgery   . Knee surgery   . Colon surgery   . Spine surgery     Prior to Admission medications   Medication Sig Start Date End Date Taking? Authorizing Provider  amiodarone (PACERONE) 200 MG tablet Take 200 mg by mouth daily.   Yes Tonny Bollman, MD  aspirin 81 MG EC tablet Take 81 mg by mouth daily.   02/18/11 02/18/12 Yes Dayna N Dunn, PA    busPIRone (BUSPAR) 15 MG tablet Take 15 mg by mouth daily as needed. For mood stabilization. 05/23/11 05/22/12 Yes Romero Belling, MD  carvedilol (COREG) 12.5 MG tablet Take 12.5 mg by mouth 2 (two) times daily.   Yes Tonny Bollman, MD  cyclobenzaprine (FLEXERIL) 5 MG tablet Take 5 mg by mouth 2 (two) times daily as needed. For muscle spasms.   Yes Historical Provider, MD  ezetimibe (ZETIA) 10 MG tablet Take 10 mg by mouth daily.   Yes Rollene Rotunda, MD  fexofenadine (ALLEGRA) 180 MG tablet Take 180 mg by mouth daily.    Yes Historical Provider, MD  fish oil-omega-3 fatty acids 1000 MG capsule Take 1,000 mg by mouth daily.     Yes Historical Provider, MD  fluticasone (FLONASE) 50 MCG/ACT nasal spray Place 2 sprays into the nose daily as needed. For allergies    Yes Historical Provider, MD  folic acid-pyridoxine-cyancobalamin (FOLTX) 2.5-25-2 MG TABS Take 1 tablet by mouth daily.     Yes Historical Provider, MD  furosemide (LASIX) 20 MG tablet Take 20 mg by mouth daily.     Yes Historical Provider, MD  HYDROcodone-acetaminophen (NORCO) 5-325 MG per tablet Take 1 tablet by mouth every 4 (four) hours as needed. For pain  12/29/10 12/29/11 Yes Etta Grandchild, MD  levothyroxine (SYNTHROID, LEVOTHROID) 150 MCG tablet Take 150 mcg by mouth daily.   Yes Etta Grandchild, MD  lisinopril (PRINIVIL,ZESTRIL) 2.5 MG tablet Take 2.5 mg by mouth 2 (two) times daily.    Yes Historical Provider, MD  methocarbamol (ROBAXIN) 500 MG tablet Take 500 mg by mouth 4 (four) times daily as needed. For spasms    Yes Historical Provider, MD  nitroGLYCERIN (NITROSTAT) 0.4 MG SL tablet Place 0.4 mg under the tongue every 5 (five) minutes as needed. For chest pain  12/17/10 12/17/11 Yes Tonny Bollman, MD  oxyCODONE-acetaminophen (PERCOCET) 5-325 MG per tablet Take 1 tablet by mouth every 4 (four) hours as needed. For pain    Yes Historical Provider, MD  pantoprazole (PROTONIX) 40 MG tablet Take 40 mg by mouth 2 (two) times daily.     Yes Historical Provider, MD  potassium chloride SA (K-DUR,KLOR-CON) 20 MEQ tablet Take 20 mEq by mouth daily.     Yes Historical Provider, MD  warfarin (COUMADIN) 3 MG tablet Take 3 mg by mouth daily. Take as directed by anticoagulation clinic   Yes Rollene Rotunda, MD  methocarbamol (ROBAXIN) 500 MG tablet Take 500 mg by mouth every 6 (six) hours as needed. For muscle  spasms  12/29/10 01/08/11  Etta Grandchild, MD    Social History: Patient lives alone, and has no offspring. She completed inpatient rehabilitation after her back surgery, and was discharged with HHPT, which she underwent in January 2013. As of 07/04/11, she had been ambulating without a walker or cane. She reports that she quit smoking about 16 years ago. She has never used smokeless tobacco. She reports that she does not drink alcohol or use illicit drugs.  Family History  Problem Relation Age of Onset  . Diabetes Mother   . Diabetes Brother   . Arthritis      family history  . Prostate cancer      Family History    Review of Systems:  As per HPI and chief complaint. Patent denies fatigue, diminished appetite, weight loss, fever, chills, headache, blurred vision, difficulty in speaking, dysphagia, chest pain, cough, shortness of breath, orthopnea, paroxysmal nocturnal dyspnea, nausea, diaphoresis, abdominal pain, vomiting, diarrhea, belching, heartburn, hematemesis, melena, dysuria, nocturia, urinary frequency, hematochezia, lower extremity swelling, pain, or redness. The rest of the systems review is negative.  Physical Exam: General:  Patient does not appear to be in obvious acute distress, provided she is lying still, but is discomfited by any movement, and even attempts to cough. Alert, communicative, fully oriented, talking in complete sentences, not short of breath at rest.  HEENT:  No clinical pallor, no jaundice, no conjunctival injection or discharge. Hydration status appears fair. NECK:  Supple, JVP not seen, no  carotid bruits, no palpable lymphadenopathy, no palpable goiter. CHEST:  Clinically clear to auscultation, no wheezes, no crackles. HEART:  Sounds 1 and 2 heard, normal, regular, no murmurs. Some old bruising is noted overlying the site of left infraclavicular ICD pouch. ABDOMEN:  Unable to properly examine, as patient is bent over in a semi-fetal position, but appears soft and non-tender. GENITALIA:  Not examined. LOWER EXTREMITIES:  No pitting edema, palpable peripheral pulses. MUSCULOSKELETAL SYSTEM:  Has tenderness lower back, with paraspinal muscle spasm. Generalized osteoarthritic changes, otherwise, normal. CENTRAL NERVOUS SYSTEM:  No focal neurologic deficit on gross examination.  Labs on Admission:  Results for orders placed during the hospital encounter of 07/05/11 (from the past 48 hour(s))  CBC     Status: Abnormal   Collection Time   07/05/11  4:01 PM      Component Value Range Comment   WBC 5.7  4.0 - 10.5 (K/uL)    RBC 3.82 (*) 3.87 - 5.11 (MIL/uL)    Hemoglobin 12.9  12.0 - 15.0 (g/dL)    HCT 16.1  09.6 - 04.5 (%)    MCV 98.7  78.0 - 100.0 (fL)    MCH 33.8  26.0 - 34.0 (pg)    MCHC 34.2  30.0 - 36.0 (g/dL)    RDW 40.9 (*) 81.1 - 15.5 (%)    Platelets 131 (*) 150 - 400 (K/uL)   DIFFERENTIAL     Status: Abnormal   Collection Time   07/05/11  4:01 PM      Component Value Range Comment   Neutrophils Relative 63  43 - 77 (%)    Neutro Abs 3.6  1.7 - 7.7 (K/uL)    Lymphocytes Relative 21  12 - 46 (%)    Lymphs Abs 1.2  0.7 - 4.0 (K/uL)    Monocytes Relative 13 (*) 3 - 12 (%)    Monocytes Absolute 0.8  0.1 - 1.0 (K/uL)    Eosinophils Relative 2  0 - 5 (%)  Eosinophils Absolute 0.1  0.0 - 0.7 (K/uL)    Basophils Relative 0  0 - 1 (%)    Basophils Absolute 0.0  0.0 - 0.1 (K/uL)   BASIC METABOLIC PANEL     Status: Abnormal   Collection Time   07/05/11  4:01 PM      Component Value Range Comment   Sodium 135  135 - 145 (mEq/L)    Potassium 4.5  3.5 - 5.1 (mEq/L)     Chloride 98  96 - 112 (mEq/L)    CO2 23  19 - 32 (mEq/L)    Glucose, Bld 80  70 - 99 (mg/dL)    BUN 14  6 - 23 (mg/dL)    Creatinine, Ser 4.09  0.50 - 1.10 (mg/dL)    Calcium 8.5  8.4 - 10.5 (mg/dL)    GFR calc non Af Amer 54 (*) >90 (mL/min)    GFR calc Af Amer 62 (*) >90 (mL/min)     Radiological Exams on Admission: *RADIOLOGY REPORT*  Clinical Data: Low back pain after bending over. Prior back  surgeries, most recently December, 2012.  LUMBAR SPINE - COMPLETE 4+ VIEW  Comparison: 01/26/2011  Findings: Posterolateral rod pedicle screw fixation noted at L3-L4  - L5-S1. No hardware fracture is observed. There may be minimal  lucency around the right S1 screw.  Posterior decompression noted in the lumbar spine. Bony  demineralization is present. Overall the degree of mild anterior  subluxation of L5 on S1 is stable.  Compared to exam from 01/26/2011, there is a 15% superior endplate  compression fracture at L2.  IMPRESSION:  1. New 15% superior endplate compression fracture at L2.  2. Otherwise unchanged appearance of the lumbar spine.  3. Per CMS PQRS reporting requirements (PQRS Measure 24): Given the  patient's age of greater than 50 and the fracture site (hip, distal  radius, or spine), the patient should be tested for osteoporosis  using DXA, and the appropriate treatment considered based on the  DXA results.  Original Report Authenticated By: Dellia Cloud, M.D.   Assessment/Plan Principal Problem:  *Back pain: This is acute and severe.Plain films of lumbar spine confirmed a 15% superior endplate compression fracture of L2. Fortunately, hardware was intact, in the rest of the lumbar spine. Ed MD assures me that this finding was discussed with Dr Phoebe Perch, who recommended trial of a brace. As this proved predictably unhelpful, she has been admitted to optimize pain-control. We anticipate that Dr Phoebe Perch will provide consult, in due course. Active Problems:  1. L2  vertebral fracture: See above discussion. This is the culprit for patient's acute pain. We shall control patient's pain, and await further recommendations by neurosurgeon. Patient will benefit from calcium and vitamin D supplements and Bisphosphonate can be commenced on discharge.  2. HTN (hypertension): This is controlled.   3. PAF (paroxysmal atrial fibrillation): Patient is currently in SR, and is rate-controlled. We shall monitor telemetrically.  4. Dyslipidemia: Pre-admission Lipid-lowering medication will be continued. 5. CAD/Ischemic CMP: Patient has known history of CAD, s/p AMI 10/2001, s/p Staged PCI-Cypher DES to 1-OM  10/2001, then Cypher DES to LAD 09/2002, ischemic CMP; EF 25%-30%, s/p [employer] ICD 2002. Clinically, she has no evidence at this time, of overt CHF, and seems well compensated. 6. Chronic Anticoagulation: Patent has known history of PAF, LV mural thrombus, and previous TIA. As such, she is on chronic anticoagulation. The presence of fairly recent bruising over ICD pouch, without recent trauma, is suggestive  of possible over-anticoagulation. We shall check PT/INR, and consult clinical pharmacologist, to supervise anticoagulation. 7. Hypothyroidism: we shall continue thyroxine replacement therapy, and check TSH.  Further management will depend on clinical course.  Time Spent on Admission: 45 mins.  Krystena Reitter,CHRISTOPHER 07/05/2011, 7:28 PM

## 2011-07-05 NOTE — ED Notes (Signed)
Spoke with IV team will attempt IV.  

## 2011-07-05 NOTE — ED Notes (Signed)
Patient returned from xray.

## 2011-07-05 NOTE — ED Notes (Signed)
IV team paged spoke with and will attempt IV start

## 2011-07-05 NOTE — Progress Notes (Signed)
ANTICOAGULATION CONSULT NOTE - Initial Consult  Pharmacy Consult for Coumadin Indication: atrial fibrillation  Allergies  Allergen Reactions  . Penicillins     Throat swells  . Prevacid     Severe nausea  . Statins     cramps    Patient Measurements:    Vital Signs: Temp: 98.7 F (37.1 C) (04/16 1519) Temp src: Oral (04/16 1519) BP: 134/76 mmHg (04/16 1848) Pulse Rate: 54  (04/16 1848)  Labs:  Basename 07/05/11 1601  HGB 12.9  HCT 37.7  PLT 131*  APTT --  LABPROT --  INR --  HEPARINUNFRC --  CREATININE 1.09  CKTOTAL --  CKMB --  TROPONINI --   The CrCl is unknown because both a height and weight (above a minimum accepted value) are required for this calculation.  Medical History: Past Medical History  Diagnosis Date  . Spinal stenosis   . Numbness of foot     left foot  . Diverticulitis     Status post partial colectomy 1/12 with reversal in July 2012  . Colitis, ischemic   . DJD (degenerative joint disease)     History of multiple surgeries to the back, shoulder and knee  . Ischemic cardiomyopathy     Echo 06/16/10: EF 25-30%, anteroseptal and apical hypokinesis, moderate AI, mild MR  . Chronic systolic heart failure   . CAD (coronary artery disease)     a.  Ant MI 8/03 with stenting of the LAD;  b. staged PCI with Cypher DES to OM1 8/03;  c. s/p Cypher DES to LAD 7/04;  d.  multiple cardiac caths in past (chronically abnl ECG);    e. cardiac catheterization 3/12: LAD stent patent, distal LAD 40-50%, small ostial D1 80%, ostial D2 60%, OM-1 stent patent (20-30%), proximal RCA 50%, proximal to mid RCA 40-50%; f. cath 02/17/2011 - nonobs  . LV (left ventricular) mural thrombus     Chronic Coumadin therapy  . Stroke     History of TIA and possible CVA; hospitalized 2004; on coumadin  . CKD (chronic kidney disease), stage III     creatinine:  1.3 in 4/12;    . Tobacco abuse     history  . GERD (gastroesophageal reflux disease)   . Hypertension   . Lower  GI bleed     August 2011  . SVT (supraventricular tachycardia)     ? h/o AFib;  patient on long term amiodarone therapy  . Hypothyroidism   . Aortic insufficiency   . Pseudoaneurysm     History of, right forearm  . ICD (implantable cardiac defibrillator), single, in situ     [employer];  2008; followed by Dr. Graciela Husbands.   [name] Hyperlipidemia   . OA (osteoarthritis)   . Abnormal EKG     Chronically abnormal EKG.   . Spinal stenosis   . Numbness of foot     Left foot    Assessment: 61 yo F admitted with L2 vertebral fracture to be continued on her chronic Coumadin for atrial fibrillation.  Home dose of Coumadin is 3 mg po daily, last dose was Sunday evening, missed last nights dose due to not feeling well.  INR remains therapeutic today at 2.26.  Goal of Therapy:  INR 2-3   Plan:  1.  Coumadin 3 mg po daily (home dose) 2.  Daily PT/INR   Rolland Porter, Pharm.D., BCPS Clinical Pharmacist Pager: [phone]

## 2011-07-05 NOTE — ED Notes (Signed)
Paged IV team 

## 2011-07-05 NOTE — ED Notes (Signed)
2 RN's attempted IV unable paged IV team.

## 2011-07-05 NOTE — ED Notes (Signed)
Pt. Reports picking a box up yesterday when she felt a "pop" in lower back. States extreme pain with movement, been laying in bed since incident.

## 2011-07-05 NOTE — ED Notes (Signed)
Another Nurse attempted 2 IV unable.  IV team is delayed. Spoke with EDP changed medication order.

## 2011-07-05 NOTE — ED Provider Notes (Addendum)
History     CSN: [id number]  Arrival date & time 07/05/11  1336   First MD Initiated Contact with Patient 07/05/11 1347      Chief Complaint  Patient presents with  . Back Pain    (Consider location/radiation/quality/duration/timing/severity/associated sxs/prior treatment) HPI.... low back pain abruptly this afternoon while walking. Felt pop.  no radicular symptoms.  Status post lumbar surgery in December 2012 by Dr. Phoebe Perch but I could not find documentation of this surgery in EPIC.  No bowel or bladder incontinence.    Past Medical History  Diagnosis Date  . Spinal stenosis   . Numbness of foot     left foot  . Diverticulitis     Status post partial colectomy 1/12 with reversal in July 2012  . Colitis, ischemic   . DJD (degenerative joint disease)     History of multiple surgeries to the back, shoulder and knee  . Ischemic cardiomyopathy     Echo 06/16/10: EF 25-30%, anteroseptal and apical hypokinesis, moderate AI, mild MR  . Chronic systolic heart failure   . CAD (coronary artery disease)     a.  Ant MI 8/03 with stenting of the LAD;  b. staged PCI with Cypher DES to OM1 8/03;  c. s/p Cypher DES to LAD 7/04;  d.  multiple cardiac caths in past (chronically abnl ECG);    e. cardiac catheterization 3/12: LAD stent patent, distal LAD 40-50%, small ostial D1 80%, ostial D2 60%, OM-1 stent patent (20-30%), proximal RCA 50%, proximal to mid RCA 40-50%; f. cath 02/17/2011 - nonobs  . LV (left ventricular) mural thrombus     Chronic Coumadin therapy  . Stroke     History of TIA and possible CVA; hospitalized 2004; on coumadin  . CKD (chronic kidney disease), stage III     creatinine:  1.3 in 4/12;    . Tobacco abuse     history  . GERD (gastroesophageal reflux disease)   . Hypertension   . Lower GI bleed     August 2011  . SVT (supraventricular tachycardia)     ? h/o AFib;  patient on long term amiodarone therapy  . Hypothyroidism   . Aortic insufficiency   . Pseudoaneurysm      History of, right forearm  . ICD (implantable cardiac defibrillator), single, in situ     [employer];  2008; followed by Dr. Graciela Husbands.   [name] Hyperlipidemia   . OA (osteoarthritis)   . Abnormal EKG     Chronically abnormal EKG.   . Spinal stenosis   . Numbness of foot     Left foot    Past Surgical History  Procedure Date  . Cardiac catheterization     Has had remote anterior MI with stenting of the LAD and LCX in 2003. Has had multiple repeat caths.   . Insert / replace / remove pacemaker   . Icd   . Back surgery   . Shoulder surgery   . Knee surgery   . Colon surgery   . Spine surgery     Family History  Problem Relation Age of Onset  . Diabetes Mother   . Diabetes Brother   . Arthritis      family history  . Prostate cancer      Family History    History  Substance Use Topics  . Smoking status: Former Smoker    Quit date: 03/22/1995  . Smokeless tobacco: Never Used   Comment: quit in 1992  .  Alcohol Use: No    OB History    Grav Para Term Preterm Abortions TAB SAB Ect Mult Living                  Review of Systems  All other systems reviewed and are negative.    Allergies  Penicillins; Prevacid; and Statins  Home Medications   Current Outpatient Rx  Name Route Sig Dispense Refill  . AMIODARONE HCL 200 MG PO TABS Oral Take 200 mg by mouth daily.    . ASPIRIN 81 MG PO TBEC Oral Take 81 mg by mouth daily.      . BUSPIRONE HCL 15 MG PO TABS Oral Take 15 mg by mouth daily as needed. For mood stabilization.    [name] CARVEDILOL 12.5 MG PO TABS Oral Take 12.5 mg by mouth 2 (two) times daily.    . CYCLOBENZAPRINE HCL 5 MG PO TABS Oral Take 5 mg by mouth 2 (two) times daily as needed. For muscle spasms.    [name] EZETIMIBE 10 MG PO TABS Oral Take 10 mg by mouth daily.    [name] FEXOFENADINE HCL 180 MG PO TABS Oral Take 180 mg by mouth daily.     . OMEGA-3 FATTY ACIDS 1000 MG PO CAPS Oral Take 1,000 mg by mouth daily.      [name] FLUTICASONE PROPIONATE 50 MCG/ACT NA  SUSP Nasal Place 2 sprays into the nose daily as needed. For allergies     . FA-PYRIDOXINE-CYANCOBALAMIN 2.5-25-2 MG PO TABS Oral Take 1 tablet by mouth daily.      . FUROSEMIDE 20 MG PO TABS Oral Take 20 mg by mouth daily.      [name] HYDROCODONE-ACETAMINOPHEN 5-325 MG PO TABS Oral Take 1 tablet by mouth every 4 (four) hours as needed. For pain     . LEVOTHYROXINE SODIUM 150 MCG PO TABS Oral Take 150 mcg by mouth daily.    [name] LISINOPRIL 2.5 MG PO TABS Oral Take 2.5 mg by mouth 2 (two) times daily.     [name] METHOCARBAMOL 500 MG PO TABS Oral Take 500 mg by mouth 4 (four) times daily as needed. For spasms     . NITROGLYCERIN 0.4 MG SL SUBL Sublingual Place 0.4 mg under the tongue every 5 (five) minutes as needed. For chest pain     . OXYCODONE-ACETAMINOPHEN 5-325 MG PO TABS Oral Take 1 tablet by mouth every 4 (four) hours as needed. For pain     . PANTOPRAZOLE SODIUM 40 MG PO TBEC Oral Take 40 mg by mouth 2 (two) times daily.     [name] POTASSIUM CHLORIDE CRYS ER 20 MEQ PO TBCR Oral Take 20 mEq by mouth daily.      . WARFARIN SODIUM 3 MG PO TABS Oral Take 3 mg by mouth daily. Take as directed by anticoagulation clinic    . METHOCARBAMOL 500 MG PO TABS Oral Take 500 mg by mouth every 6 (six) hours as needed. For muscle spasms       BP 148/55  Pulse 54  Temp(Src) 98.7 F (37.1 C) (Oral)  Resp 15  SpO2 100%  Physical Exam  Nursing note and vitals reviewed. Constitutional: She is oriented to person, place, and time. She appears well-developed and well-nourished.  HENT:  Head: Normocephalic and atraumatic.  Eyes: Conjunctivae and EOM are normal. Pupils are equal, round, and reactive to light.  Neck: Normal range of motion. Neck supple.  Cardiovascular: Normal rate and regular rhythm.   Pulmonary/Chest: Effort normal and breath  sounds normal.  Abdominal: Soft. Bowel sounds are normal.  Musculoskeletal:       10 cm vertical scar and lumbar spine in midline. Tender throughout lumbar spine.  Neurological:  She is alert and oriented to person, place, and time.       Pain with minimal straight leg raise bilaterally  Skin: Skin is warm and dry.  Psychiatric: She has a normal mood and affect.    ED Course  Procedures (including critical care time)   Labs Reviewed  CBC  DIFFERENTIAL  BASIC METABOLIC PANEL   Dg Lumbar Spine Complete  07/05/2011  *RADIOLOGY REPORT*  Clinical Data: Low back pain after bending over.  Prior back surgeries, most recently December, 2012.  LUMBAR SPINE - COMPLETE 4+ VIEW  Comparison: 01/26/2011  Findings: Posterolateral rod pedicle screw fixation noted at L3-L4 - L5-S1. No hardware fracture is observed.  There may be minimal lucency around the right S1 screw.  Posterior decompression noted in the lumbar spine.  Bony demineralization is present.  Overall the degree of mild anterior subluxation of L5 on S1 is stable.  Compared to exam from 01/26/2011, there is a 15% superior endplate compression fracture at L2.  IMPRESSION:  1.  New 15% superior endplate compression fracture at L2. 2.  Otherwise unchanged appearance of the lumbar spine. 3. Per CMS PQRS reporting requirements (PQRS Measure 24): Given the patient's age of greater than 50 and the fracture site (hip, distal radius, or spine), the patient should be tested for osteoporosis using DXA, and the appropriate treatment considered based on the DXA results.  Original Report Authenticated By: Dellia Cloud, M.D.     1. Chest pain       MDM  Plain films of lumbar spine show a 15% superior endplate compression fracture of lumbar 2. Other hardware appears intact.  Clinical scenario was discussed with Dr. Phoebe Perch.  He recommended patient be fitted in her brace.  Patient's brother will go home and try to find a brace. Patient can ambulate she will be discharged home.  The patient cannot ambulate she will be admitted to the hospital. I discussed this with Dr. Preston Fleeting who will take over the care of this  patient        Donnetta Hutching, MD 07/05/11 1558  Patient received IM hydromorphone and was still unable to tolerate getting up from a lying position. Laboratory workup is unremarkable and renal function actually seems to have improved. She will need to be admitted for pain control. Case has been discussed with Dr. Genia Harold of [facility] who agrees to admit the patient.  Results for orders placed during the hospital encounter of 07/05/11  CBC      Component Value Range   WBC 5.7  4.0 - 10.5 (K/uL)   RBC 3.82 (*) 3.87 - 5.11 (MIL/uL)   Hemoglobin 12.9  12.0 - 15.0 (g/dL)   HCT 98.1  19.1 - 47.8 (%)   MCV 98.7  78.0 - 100.0 (fL)   MCH 33.8  26.0 - 34.0 (pg)   MCHC 34.2  30.0 - 36.0 (g/dL)   RDW 29.5 (*) 62.1 - 15.5 (%)   Platelets 131 (*) 150 - 400 (K/uL)  DIFFERENTIAL      Component Value Range   Neutrophils Relative 63  43 - 77 (%)   Neutro Abs 3.6  1.7 - 7.7 (K/uL)   Lymphocytes Relative 21  12 - 46 (%)   Lymphs Abs 1.2  0.7 - 4.0 (K/uL)   Monocytes  Relative 13 (*) 3 - 12 (%)   Monocytes Absolute 0.8  0.1 - 1.0 (K/uL)   Eosinophils Relative 2  0 - 5 (%)   Eosinophils Absolute 0.1  0.0 - 0.7 (K/uL)   Basophils Relative 0  0 - 1 (%)   Basophils Absolute 0.0  0.0 - 0.1 (K/uL)  BASIC METABOLIC PANEL      Component Value Range   Sodium 135  135 - 145 (mEq/L)   Potassium 4.5  3.5 - 5.1 (mEq/L)   Chloride 98  96 - 112 (mEq/L)   CO2 23  19 - 32 (mEq/L)   Glucose, Bld 80  70 - 99 (mg/dL)   BUN 14  6 - 23 (mg/dL)   Creatinine, Ser 8.65  0.50 - 1.10 (mg/dL)   Calcium 8.5  8.4 - 78.4 (mg/dL)   GFR calc non Af Amer 54 (*) >90 (mL/min)   GFR calc Af Amer 62 (*) >90 (mL/min)   Dg Lumbar Spine Complete  07/05/2011  *RADIOLOGY REPORT*  Clinical Data: Low back pain after bending over.  Prior back surgeries, most recently December, 2012.  LUMBAR SPINE - COMPLETE 4+ VIEW  Comparison: 01/26/2011  Findings: Posterolateral rod pedicle screw fixation noted at L3-L4 - L5-S1. No hardware  fracture is observed.  There may be minimal lucency around the right S1 screw.  Posterior decompression noted in the lumbar spine.  Bony demineralization is present.  Overall the degree of mild anterior subluxation of L5 on S1 is stable.  Compared to exam from 01/26/2011, there is a 15% superior endplate compression fracture at L2.  IMPRESSION:  1.  New 15% superior endplate compression fracture at L2. 2.  Otherwise unchanged appearance of the lumbar spine. 3. Per CMS PQRS reporting requirements (PQRS Measure 24): Given the patient's age of greater than 50 and the fracture site (hip, distal radius, or spine), the patient should be tested for osteoporosis using DXA, and the appropriate treatment considered based on the DXA results.  Original Report Authenticated By: Dellia Cloud, M.D.      Dione Booze, MD 07/05/11 [phone]

## 2011-07-05 NOTE — ED Notes (Signed)
Patient transported to Ultrasound 

## 2011-07-05 NOTE — ED Notes (Signed)
Pain while laying supine at rest 2/10 achy dull lower back pain.

## 2011-07-06 LAB — CBC
MCHC: 33.3 g/dL (ref 30.0–36.0)
RDW: 15.8 % — ABNORMAL HIGH (ref 11.5–15.5)

## 2011-07-06 LAB — COMPREHENSIVE METABOLIC PANEL
ALT: 18 U/L (ref 0–35)
Alkaline Phosphatase: 49 U/L (ref 39–117)
BUN: 22 mg/dL (ref 6–23)
CO2: 28 mEq/L (ref 19–32)
Calcium: 8.7 mg/dL (ref 8.4–10.5)
GFR calc Af Amer: 40 mL/min — ABNORMAL LOW (ref >90)
GFR calc non Af Amer: 35 mL/min — ABNORMAL LOW (ref >90)
Glucose, Bld: 130 mg/dL — ABNORMAL HIGH (ref 70–99)
Potassium: 3.7 mEq/L (ref 3.5–5.1)
Sodium: 136 mEq/L (ref 135–145)

## 2011-07-06 LAB — TSH: TSH: 24.719 u[IU]/mL — ABNORMAL HIGH (ref 0.350–4.500)

## 2011-07-06 MED ORDER — HYDROCODONE-ACETAMINOPHEN 5-325 MG PO TABS
2.0000 | ORAL_TABLET | ORAL | Status: DC | PRN
  Administered 2011-07-07 – 2011-07-08 (×2): 2 via ORAL
  Filled 2011-07-06 (×2): qty 2

## 2011-07-06 MED ORDER — ACETAMINOPHEN 500 MG PO TABS
500.0000 mg | ORAL_TABLET | Freq: Three times a day (TID) | ORAL | Status: DC
  Administered 2011-07-06 – 2011-07-08 (×5): 500 mg via ORAL
  Filled 2011-07-06 (×9): qty 1

## 2011-07-06 NOTE — Progress Notes (Signed)
Utilization Review Completed.[name] T4/17/2013   

## 2011-07-06 NOTE — Progress Notes (Signed)
Subjective: Still with 10/10 back pain with minimal movement   Physical Exam: Blood pressure 90/52, pulse 76, temperature 97.5 F (36.4 C), temperature source Oral, resp. rate 18, height 5\' 4"  (1.626 m), SpO2 98.00%. Patient Vitals for the past 24 hrs:  BP Temp Temp src Pulse Resp SpO2 Height  07/06/11 0945 90/52 mmHg 97.5 F (36.4 C) Oral 76  18  98 % -  07/06/11 0426 87/54 mmHg 98.6 F (37 C) Oral 51  18  100 % -  07/05/11 2252 173/84 mmHg 97.5 F (36.4 C) Oral 66  18  91 % 5\' 4"  (1.626 m)  07/05/11 2220 146/74 mmHg - - 53  12  100 % -  07/05/11 1848 134/76 mmHg - - 54  16  100 % -  07/05/11 1812 131/71 mmHg - - 56  16  100 % -    Alert and oriented x3 CVS: RRR RS: CTAB Abdomen: soft, nt   Investigations:  No results found for this or any previous visit (from the past 240 hour(s)).   Basic Metabolic Panel:  Basename 07/06/11 0605 07/05/11 1601  NA 136 135  K 3.7 4.5  CL 98 98  CO2 28 23  GLUCOSE 130* 80  BUN 22 14  CREATININE 1.57* 1.09  CALCIUM 8.7 8.5  MG -- --  PHOS -- --   Liver Function Tests:  [facility] 07/06/11 0605  AST 22  ALT 18  ALKPHOS 49  BILITOT 0.7  PROT 6.4  ALBUMIN 3.5     CBC:  Basename 07/06/11 0605 07/05/11 1601  WBC 6.1 5.7  NEUTROABS -- 3.6  HGB 11.8* 12.9  HCT 35.4* 37.7  MCV 99.2 98.7  PLT 146* 131*    Dg Lumbar Spine Complete  07/05/2011  *RADIOLOGY REPORT*  Clinical Data: Low back pain after bending over.  Prior back surgeries, most recently December, 2012.  LUMBAR SPINE - COMPLETE 4+ VIEW  Comparison: 01/26/2011  Findings: Posterolateral rod pedicle screw fixation noted at L3-L4 - L5-S1. No hardware fracture is observed.  There may be minimal lucency around the right S1 screw.  Posterior decompression noted in the lumbar spine.  Bony demineralization is present.  Overall the degree of mild anterior subluxation of L5 on S1 is stable.  Compared to exam from 01/26/2011, there is a 15% superior endplate compression fracture  at L2.  IMPRESSION:  1.  New 15% superior endplate compression fracture at L2. 2.  Otherwise unchanged appearance of the lumbar spine. 3. Per CMS PQRS reporting requirements (PQRS Measure 24): Given the patient's age of greater than 50 and the fracture site (hip, distal radius, or spine), the patient should be tested for osteoporosis using DXA, and the appropriate treatment considered based on the DXA results.  Original Report Authenticated By: Dellia Cloud, M.D.      Medications:  Scheduled:    . acetaminophen  500 mg Oral TID  . amiodarone  200 mg Oral Daily  . aspirin EC  81 mg Oral Daily  . calcium-vitamin D  1 tablet Oral BID WC  . carvedilol  12.5 mg Oral BID WC  . ezetimibe  10 mg Oral Daily  . folic acid-pyridoxine-cyancobalamin  1 tablet Oral Daily  . furosemide  20 mg Oral Daily  .  HYDROmorphone (DILAUDID) injection  2 mg Intramuscular Once  . levothyroxine  150 mcg Oral Q0600  . lisinopril  2.5 mg Oral BID  . loratadine  10 mg Oral Daily  . methocarbamol  500 mg  Oral QID  . omega-3 acid ethyl esters  1,000 mg Oral Q breakfast  . ondansetron  8 mg Oral Once  . oxyCODONE  5 mg Oral QID  . pantoprazole  40 mg Oral BID  . polyethylene glycol  17 g Oral Daily  . potassium chloride SA  20 mEq Oral Daily  . promethazine  12.5 mg Intravenous To Minor  . sodium chloride  3 mL Intravenous Q12H  . sodium chloride  3 mL Intravenous Q12H  . warfarin  3 mg Oral q1800  . Warfarin - Pharmacist Dosing Inpatient   Does not apply q1800  . DISCONTD:  HYDROmorphone (DILAUDID) injection  0.5 mg Intravenous Once  . DISCONTD: ondansetron  4 mg Intravenous Once   Continuous:    . DISCONTD: sodium chloride     sodium chloride, acetaminophen, acetaminophen, busPIRone, fluticasone, HYDROcodone-acetaminophen, HYDROmorphone, nitroGLYCERIN, ondansetron (ZOFRAN) IV, ondansetron, promethazine, sodium chloride  Impression:  Principal Problem:  *Back pain Active Problems:  MYOCARDIAL  INFARCTION, HX OF  Chronic systolic heart failure  CKD (chronic kidney disease)  implantable cardiac defibrillator- [employer]-single-chamber  CAD (coronary artery disease)  L2 vertebral fracture  HTN (hypertension)  PAF (paroxysmal atrial fibrillation)  Dyslipidemia   Plan:  Will schedule tylenol around the clock Prn vicodin for breakthrough pain Continue home meds Called consult with Dr. Waldon Merl with brace     LOS: 1 day   Keylen Eckenrode, MD Pager: [phone] 07/06/2011, 3:32 PM

## 2011-07-06 NOTE — Progress Notes (Signed)
Physical Therapy Note:   07/06/11 0850  PT Visit Information  Reason Eval/Treat Not Completed  Need clarification re: activity orders and brace don/doffing   PT Plan   Will proceed with PT eval once clarification re: activity and Neurosurgery orders re: don/doffing brace is received; Will follow-up later today as time allows, otherwise will follo-up tomorrow    Van Clines, [place] [phone]

## 2011-07-06 NOTE — Evaluation (Signed)
Occupational Therapy Evaluation Patient Details Name: [name] MRN: [id number] DOB: 03/30/1950 Today's Date: 07/06/2011  Problem List:  Patient Active Problem List  Diagnoses  . HYPOTHYROIDISM  . HYPERLIPIDEMIA  . HYPERTENSION  . MYOCARDIAL INFARCTION, HX OF  . Chronic systolic heart failure  . OSTEOARTHRITIS  . Spinal stenosis  . Numbness of foot  . Ischemia, bowel  . Ischemic cardiomyopathy  . Chronic anticoagulation  . Stroke  . Arthritis  . CKD (chronic kidney disease)  . Tobacco abuse  . GERD (gastroesophageal reflux disease)  . LV (left ventricular) mural thrombus  . [employer] AICD  . Atrial fibrillation  . implantable cardiac defibrillator- Engineering geologist  . Anemia  . Unstable angina  . CAD (coronary artery disease)  . Unspecified disorder of urethra and urinary tract  . Carotid stenosis  . Back pain  . L2 vertebral fracture  . HTN (hypertension)  . PAF (paroxysmal atrial fibrillation)  . Dyslipidemia    Past Medical History:  Past Medical History  Diagnosis Date  . Spinal stenosis   . Numbness of foot     left foot  . Diverticulitis     Status post partial colectomy 1/12 with reversal in July 2012  . Colitis, ischemic   . DJD (degenerative joint disease)     History of multiple surgeries to the back, shoulder and knee  . Ischemic cardiomyopathy     Echo 06/16/10: EF 25-30%, anteroseptal and apical hypokinesis, moderate AI, mild MR  . Chronic systolic heart failure   . CAD (coronary artery disease)     a.  Ant MI 8/03 with stenting of the LAD;  b. staged PCI with Cypher DES to OM1 8/03;  c. s/p Cypher DES to LAD 7/04;  d.  multiple cardiac caths in past (chronically abnl ECG);    e. cardiac catheterization 3/12: LAD stent patent, distal LAD 40-50%, small ostial D1 80%, ostial D2 60%, OM-1 stent patent (20-30%), proximal RCA 50%, proximal to mid RCA 40-50%; f. cath 02/17/2011 - nonobs  . LV (left ventricular) mural thrombus       Chronic Coumadin therapy  . CKD (chronic kidney disease), stage III     creatinine:  1.3 in 4/12;    . Tobacco abuse     history  . GERD (gastroesophageal reflux disease)   . Hypertension   . Lower GI bleed     August 2011  . Hypothyroidism   . Aortic insufficiency   . Pseudoaneurysm     History of, right forearm  . Hyperlipidemia   . OA (osteoarthritis)   . Abnormal EKG     Chronically abnormal EKG.   . Spinal stenosis   . Numbness of foot     Left foot  . Compression fracture 07/04/11    L2  . Anterior myocardial infarction 10/2001  . CHF (congestive heart failure)   . SVT (supraventricular tachycardia)     ? h/o AFib;  patient on long term amiodarone therapy  . PAF (paroxysmal atrial fibrillation)   . Anatomical narrow angle of right eye   . Heart murmur     "age 50-19"  . Shortness of breath     "at times; related to CHF"  . Shortness of breath on exertion   . 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 Graciela Husbands.   [name] AICD (automatic cardioverter/defibrillator) present 10/2004    explant; implant  . Blood transfusion   . Anemia   .  H/O hiatal hernia   . Stroke     History of TIA and possible CVA; hospitalized 2004; on coumadin   Past Surgical History:  Past Surgical History  Procedure Date  . Back surgery   . Coronary angioplasty with stent placement 10/2001; 09/2002  . Cardiac defibrillator placement 07/2003; 10/2004  . Lumbar laminectomy/decompression microdiscectomy 10/2001    L5-S1/E-chart  . Knee arthroscopy     left  . Thyroidectomy 1970's  . Total knee arthroplasty 11/2003    left  . X-stop implantation 12/2004    L3-4; L4-5  . Fixation kyphoplasty thoracic spine 07/2007    T3, 4, 6 compression fractures  . Shoulder open rotator cuff repair 04/2008    right  . Lumbar laminectomy/decompression microdiscectomy 01/2010  . Colectomy 03/2010    sigmoid left  . Colostomy 03/2010    transverse  . Peripherally inserted central  catheter insertion 03/2010    removed upon discharge  . Colostomy closure 12/2010    reversal  . Tonsillectomy and adenoidectomy 1969  . Appendectomy 03/2010  . Cataract extraction     right    OT Assessment/Plan/Recommendation OT Assessment Clinical Impression Statement: This 61 y.o. female admitted with L2 compression fracture due to a fall at home.  Pt.  presents to OT with the below listed deficits.  Pt. is very motivated, but activity is limited by pain.  Pt was able to transition to EOB sitting this date with back brace on.  At worst pain 10/10, at best pain 4-5/10.  Depending on pain and progress, feel she would benefit from CIR OT Recommendation/Assessment: Patient will need skilled OT in the acute care venue OT Problem List: Decreased activity tolerance;Decreased knowledge of precautions;Pain;Decreased strength Barriers to Discharge: None OT Therapy Diagnosis : Generalized weakness;Acute pain OT Plan OT Frequency: Min 2X/week OT Treatment/Interventions: Self-care/ADL training;DME and/or AE instruction;Therapeutic activities;Patient/family education;Balance training OT Recommendation Recommendations for Other Services: Rehab consult Follow Up Recommendations: Inpatient Rehab;Supervision/Assistance - 24 hour Equipment Recommended: None recommended by OT Individuals Consulted Consulted and Agree with Results and Recommendations: Patient OT Goals Acute Rehab OT Goals OT Goal Formulation: With patient Time For Goal Achievement: 7 days ADL Goals Pt Will Perform Grooming: with min assist;Standing at sink ADL Goal: Grooming - Progress: Goal set today Pt Will Perform Upper Body Bathing: with set-up;Sitting, chair ADL Goal: Upper Body Bathing - Progress: Goal set today Pt Will Perform Lower Body Bathing: with min assist;Sit to stand from chair;Sit to stand from bed ADL Goal: Lower Body Bathing - Progress: Goal set today Pt Will Perform Upper Body Dressing: with set-up;Sitting,  bed ADL Goal: Upper Body Dressing - Progress: Goal set today Pt Will Perform Lower Body Dressing: Sit to stand from bed;Sit to stand from chair;with min assist;with adaptive equipment ADL Goal: Lower Body Dressing - Progress: Goal set today Pt Will Transfer to Toilet: with min assist;3-in-1;Stand pivot transfer ADL Goal: Toilet Transfer - Progress: Goal set today Pt Will Perform Toileting - Clothing Manipulation: with min assist;Standing ADL Goal: Toileting - Clothing Manipulation - Progress: Goal set today Pt Will Perform Toileting - Hygiene: with supervision;Sitting on 3-in-1 or toilet ADL Goal: Toileting - Hygiene - Progress: Goal set today  OT Evaluation Precautions/Restrictions  Precautions Precautions: Back Precaution Comments: Pt able to recall 2/3 precautions from previous back surgery Required Braces or Orthoses: Spinal Brace Restrictions Weight Bearing Restrictions: No Prior Functioning Home Living Lives With: Spouse Available Help at Discharge: Family;Available 24 hours/day (Brother) Type of Home: House Home Access: Stairs  to enter Entrance Stairs-Number of Steps: 2 Entrance Stairs-Rails: Left Home Layout: One level Bathroom Shower/Tub: Tub/shower unit;Curtain Bathroom Toilet: Handicapped height Bathroom Accessibility: Yes How Accessible: Accessible via walker Home Adaptive Equipment: Bedside commode/3-in-1;Shower chair with back;Walker - rolling;Reacher Prior Function Level of Independence: Independent Able to Take Stairs?: Yes Driving: Yes Vocation: Retired  ADL ADL Eating/Feeding: Simulated Where Assessed - Eating/Feeding: Bed level Grooming: Teeth care;Wash/dry hands;Wash/dry face;Set up Where Assessed - Grooming: Supine, head of bed up Upper Body Bathing: Simulated;Minimal assistance Where Assessed - Upper Body Bathing: Supine, head of bed flat Lower Body Bathing: Simulated;+1 Total assistance Where Assessed - Lower Body Bathing: Rolling right and/or  left Upper Body Dressing: Simulated;+1 Total assistance Where Assessed - Upper Body Dressing: Sitting, bed;Unsupported Lower Body Dressing: Simulated;+1 Total assistance Where Assessed - Lower Body Dressing: Rolling right and/or left;Supine, head of bed flat Toilet Transfer:  (Pt unable) ADL Comments: Pt. able to tolerate sitting EOB only due to pain.  Pt. with increased pain when lifting each UE.  Pt. sat EOB x 12 mins with supervision Vision/Perception    Cognition Cognition Arousal/Alertness: Awake/alert Overall Cognitive Status: Appears within functional limits for tasks assessed Orientation Level: Oriented X4 Sensation/Coordination Sensation Light Touch: Appears Intact Coordination Gross Motor Movements are Fluid and Coordinated: Yes Fine Motor Movements are Fluid and Coordinated: Yes Extremity Assessment RUE Assessment RUE Assessment: Within Functional Limits LUE Assessment LUE Assessment: Within Functional Limits Mobility  Bed Mobility Bed Mobility: Yes Rolling Right: 4: Min assist;With rail Rolling Left: 4: Min assist;With rail Right Sidelying to Sit: HOB elevated (comment degrees);4: Min assist (60) Right Sidelying to Sit Details (indicate cue type and reason): Initially attempt to move sidelying to sit, only achieved 3/4 motion and pt. limited by pain 10/10.  Returned to sidelying, and slowly increased the HOB to 60 and pt then able to transition to sitting Sit to Sidelying Right: 4: Min assist;With rail;HOB elevated (comment degrees) (60) Transfers Transfers: No Exercises   End of Session OT - End of Session Equipment Utilized During Treatment: Back brace (TLSO donned supine) Activity Tolerance: Patient limited by pain Patient left: in bed;with call bell in reach Nurse Communication: Other (comment) (mobility to EOB) General Behavior During Session: [facility] for tasks performed   Deloria Brassfield M 07/06/2011, 4:42 PM

## 2011-07-06 NOTE — Progress Notes (Signed)
ANTICOAGULATION CONSULT NOTE - Follow Up Consult  Pharmacy Consult for : Coumadin  Indication: atrial fibrillation  Allergies  Allergen Reactions  . Penicillins Swelling    Throat swells  . Prevacid Nausea Only  . Statins Other (See Comments)    cramps    Patient Measurements: Height: 5\' 4"  (162.6 cm) IBW/kg (Calculated) : 54.7    Vital Signs: Temp: 97.7 F (36.5 C) (04/17 1400) Temp src: Oral (04/17 1400) BP: 95/61 mmHg (04/17 1400) Pulse Rate: 61  (04/17 1400)  Labs:  Basename 07/06/11 0605 07/05/11 2112 07/05/11 1601  HGB 11.8* -- 12.9  HCT 35.4* -- 37.7  PLT 146* -- 131*  APTT -- -- --  LABPROT 24.2* 25.3* --  INR 2.13* 2.26* --  HEPARINUNFRC -- -- --  CREATININE 1.57* -- 1.09  CKTOTAL -- -- --  CKMB -- -- --  TROPONINI -- -- --   The CrCl is unknown because both a height and weight (above a minimum accepted value) are required for this calculation.   Medications:  Prescriptions prior to admission  Medication Sig Dispense Refill  . amiodarone (PACERONE) 200 MG tablet Take 200 mg by mouth daily.      [name] aspirin 81 MG EC tablet Take 81 mg by mouth daily.        . busPIRone (BUSPAR) 15 MG tablet Take 15 mg by mouth daily as needed. For mood stabilization.      . carvedilol (COREG) 12.5 MG tablet Take 12.5 mg by mouth 2 (two) times daily.      . cyclobenzaprine (FLEXERIL) 5 MG tablet Take 5 mg by mouth 2 (two) times daily as needed. For muscle spasms.      [name] ezetimibe (ZETIA) 10 MG tablet Take 10 mg by mouth daily.      . fexofenadine (ALLEGRA) 180 MG tablet Take 180 mg by mouth daily.       . fish oil-omega-3 fatty acids 1000 MG capsule Take 1,000 mg by mouth daily.        . fluticasone (FLONASE) 50 MCG/ACT nasal spray Place 2 sprays into the nose daily as needed. For allergies       . folic acid-pyridoxine-cyancobalamin (FOLTX) 2.5-25-2 MG TABS Take 1 tablet by mouth daily.        . furosemide (LASIX) 20 MG tablet Take 20 mg by mouth daily.        [name]  HYDROcodone-acetaminophen (NORCO) 5-325 MG per tablet Take 1 tablet by mouth every 4 (four) hours as needed. For pain       . levothyroxine (SYNTHROID, LEVOTHROID) 150 MCG tablet Take 150 mcg by mouth daily.      [name] lisinopril (PRINIVIL,ZESTRIL) 2.5 MG tablet Take 2.5 mg by mouth 2 (two) times daily.       . methocarbamol (ROBAXIN) 500 MG tablet Take 500 mg by mouth 4 (four) times daily as needed. For spasms       . nitroGLYCERIN (NITROSTAT) 0.4 MG SL tablet Place 0.4 mg under the tongue every 5 (five) minutes as needed. For chest pain       . oxyCODONE-acetaminophen (PERCOCET) 5-325 MG per tablet Take 1 tablet by mouth every 4 (four) hours as needed. For pain       . pantoprazole (PROTONIX) 40 MG tablet Take 40 mg by mouth 2 (two) times daily.       . potassium chloride SA (K-DUR,KLOR-CON) 20 MEQ tablet Take 20 mEq by mouth daily.        [name] warfarin (  COUMADIN) 3 MG tablet Take 3 mg by mouth daily. Take as directed by anticoagulation clinic      . methocarbamol (ROBAXIN) 500 MG tablet Take 500 mg by mouth every 6 (six) hours as needed. For muscle spasms           acetaminophen 500 mg Oral TID  amiodarone 200 mg Oral Daily  aspirin EC 81 mg Oral Daily  calcium-vitamin D 1 tablet Oral BID WC  carvedilol 12.5 mg Oral BID WC  ezetimibe 10 mg Oral Daily  folic acid-pyridoxine-cyancobalamin 1 tablet Oral Daily  furosemide 20 mg Oral Daily  HYDROmorphone (DILAUDID) injection 2 mg Intramuscular Once  levothyroxine 150 mcg Oral Q0600  lisinopril 2.5 mg Oral BID  loratadine 10 mg Oral Daily  methocarbamol 500 mg Oral QID  omega-3 acid ethyl esters 1,000 mg Oral Q breakfast  ondansetron 8 mg Oral Once  oxyCODONE 5 mg Oral QID  pantoprazole 40 mg Oral BID  polyethylene glycol 17 g Oral Daily  potassium chloride SA 20 mEq Oral Daily  promethazine 12.5 mg Intravenous To Minor  sodium chloride 3 mL Intravenous Q12H  sodium chloride 3 mL Intravenous Q12H  warfarin 3 mg Oral q1800  Warfarin -  Pharmacist Dosing Inpatient  Does not apply q1800  DISCONTD:  HYDROmorphone (DILAUDID) injection 0.5 mg Intravenous Once  DISCONTD: ondansetron 4 mg Intravenous Once    Assessment:  Patient is a 61 y/o female admitted with 10/10 back pain who is on chronic Coumadin for A-fib.  INR therapeutic on home dose schedule.  Goal of Therapy:   INR 2-3   Plan:   Continue Coumadin 3mg  daily.  [name], Elisha Headland, Pharm.D. 07/06/2011 3:46 PM

## 2011-07-06 NOTE — Consult Note (Signed)
Reason for Consult:L2 fx Referring Physician: laza  [name] is an 61 y.o. female.  WUJ:WJXB over and had severe LBP - has history of thoracic compression fx and prior lumar fusion surgery.  xrays show  L2 mild compression fx. Pt admitted for pain control. - no leg sx'x  PMH:  Past Medical History  Diagnosis Date  . Spinal stenosis   . Numbness of foot     left foot  . Diverticulitis     Status post partial colectomy 1/12 with reversal in July 2012  . Colitis, ischemic   . DJD (degenerative joint disease)     History of multiple surgeries to the back, shoulder and knee  . Ischemic cardiomyopathy     Echo 06/16/10: EF 25-30%, anteroseptal and apical hypokinesis, moderate AI, mild MR  . Chronic systolic heart failure   . CAD (coronary artery disease)     a.  Ant MI 8/03 with stenting of the LAD;  b. staged PCI with Cypher DES to OM1 8/03;  c. s/p Cypher DES to LAD 7/04;  d.  multiple cardiac caths in past (chronically abnl ECG);    e. cardiac catheterization 3/12: LAD stent patent, distal LAD 40-50%, small ostial D1 80%, ostial D2 60%, OM-1 stent patent (20-30%), proximal RCA 50%, proximal to mid RCA 40-50%; f. cath 02/17/2011 - nonobs  . LV (left ventricular) mural thrombus     Chronic Coumadin therapy  . CKD (chronic kidney disease), stage III     creatinine:  1.3 in 4/12;    . Tobacco abuse     history  . GERD (gastroesophageal reflux disease)   . Hypertension   . Lower GI bleed     August 2011  . Hypothyroidism   . Aortic insufficiency   . Pseudoaneurysm     History of, right forearm  . Hyperlipidemia   . OA (osteoarthritis)   . Abnormal EKG     Chronically abnormal EKG.   . Spinal stenosis   . Numbness of foot     Left foot  . Compression fracture 07/04/11    L2  . Anterior myocardial infarction 10/2001  . CHF (congestive heart failure)   . SVT (supraventricular tachycardia)     ? h/o AFib;  patient on long term amiodarone therapy  . PAF (paroxysmal atrial  fibrillation)   . Anatomical narrow angle of right eye   . Heart murmur     "age 46-19"  . Shortness of breath     "at times; related to CHF"  . Shortness of breath on exertion   . 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 Graciela Husbands.   [name] AICD (automatic cardioverter/defibrillator) present 10/2004    explant; implant  . Blood transfusion   . Anemia   . H/O hiatal hernia   . Stroke     History of TIA and possible CVA; hospitalized 2004; on coumadin    Past Surgical History  Procedure Date  . Back surgery   . Coronary angioplasty with stent placement 10/2001; 09/2002  . Cardiac defibrillator placement 07/2003; 10/2004  . Lumbar laminectomy/decompression microdiscectomy 10/2001    L5-S1/E-chart  . Knee arthroscopy     left  . Thyroidectomy 1970's  . Total knee arthroplasty 11/2003    left  . X-stop implantation 12/2004    L3-4; L4-5  . Fixation kyphoplasty thoracic spine 07/2007    T3, 4, 6 compression fractures  . Shoulder open rotator cuff repair 04/2008  right  . Lumbar laminectomy/decompression microdiscectomy 01/2010  . Colectomy 03/2010    sigmoid left  . Colostomy 03/2010    transverse  . Peripherally inserted central catheter insertion 03/2010    removed upon discharge  . Colostomy closure 12/2010    reversal  . Tonsillectomy and adenoidectomy 1969  . Appendectomy 03/2010  . Cataract extraction     right    Family History:  Family History  Problem Relation Age of Onset  . Diabetes Mother   . Diabetes Brother   . Arthritis      family history  . Prostate cancer      Family History    Social History:  reports that she quit smoking about 10 years ago. Her smoking use included Cigarettes. She has a 3.3 pack-year smoking history. She has never used smokeless tobacco. She reports that she drinks alcohol. She reports that she does not use illicit drugs.  Allergies:  Allergies  Allergen Reactions  . Penicillins Swelling    Throat  swells  . Prevacid Nausea Only  . Statins Other (See Comments)    cramps    Medications:  Prior to Admission medications   Medication  Sig  Start Date  End Date  Taking?  Authorizing Provider   amiodarone (PACERONE) 200 MG tablet  Take 200 mg by mouth daily.    Yes  Tonny Bollman, MD   aspirin 81 MG EC tablet  Take 81 mg by mouth daily.  02/18/11  02/18/12  Yes  Dayna N Dunn, PA   busPIRone (BUSPAR) 15 MG tablet  Take 15 mg by mouth daily as needed. For mood stabilization.  05/23/11  05/22/12  Yes  Romero Belling, MD   carvedilol (COREG) 12.5 MG tablet  Take 12.5 mg by mouth 2 (two) times daily.    Yes  Tonny Bollman, MD   cyclobenzaprine (FLEXERIL) 5 MG tablet  Take 5 mg by mouth 2 (two) times daily as needed. For muscle spasms.    Yes  Historical Provider, MD   ezetimibe (ZETIA) 10 MG tablet  Take 10 mg by mouth daily.    Yes  Rollene Rotunda, MD   fexofenadine (ALLEGRA) 180 MG tablet  Take 180 mg by mouth daily.    Yes  Historical Provider, MD   fish oil-omega-3 fatty acids 1000 MG capsule  Take 1,000 mg by mouth daily.    Yes  Historical Provider, MD   fluticasone (FLONASE) 50 MCG/ACT nasal spray  Place 2 sprays into the nose daily as needed. For allergies    Yes  Historical Provider, MD   folic acid-pyridoxine-cyancobalamin (FOLTX) 2.5-25-2 MG TABS  Take 1 tablet by mouth daily.    Yes  Historical Provider, MD   furosemide (LASIX) 20 MG tablet  Take 20 mg by mouth daily.    Yes  Historical Provider, MD   HYDROcodone-acetaminophen (NORCO) 5-325 MG per tablet  Take 1 tablet by mouth every 4 (four) hours as needed. For pain  12/29/10  12/29/11  Yes  Etta Grandchild, MD   levothyroxine (SYNTHROID, LEVOTHROID) 150 MCG tablet  Take 150 mcg by mouth daily.    Yes  Etta Grandchild, MD   lisinopril (PRINIVIL,ZESTRIL) 2.5 MG tablet  Take 2.5 mg by mouth 2 (two) times daily.    Yes  Historical Provider, MD   methocarbamol (ROBAXIN) 500 MG tablet  Take 500 mg by mouth 4 (four) times daily as needed. For  spasms    Yes  Historical Provider, MD  nitroGLYCERIN (NITROSTAT) 0.4 MG SL tablet  Place 0.4 mg under the tongue every 5 (five) minutes as needed. For chest pain  12/17/10  12/17/11  Yes  Tonny Bollman, MD   oxyCODONE-acetaminophen (PERCOCET) 5-325 MG per tablet  Take 1 tablet by mouth every 4 (four) hours as needed. For pain    Yes  Historical Provider, MD   pantoprazole (PROTONIX) 40 MG tablet  Take 40 mg by mouth 2 (two) times daily.    Yes  Historical Provider, MD   potassium chloride SA (K-DUR,KLOR-CON) 20 MEQ tablet  Take 20 mEq by mouth daily.    Yes  Historical Provider, MD   warfarin (COUMADIN) 3 MG tablet  Take 3 mg by mouth daily. Take as directed by anticoagulation clinic    Yes  Rollene Rotunda, MD   methocarbamol (ROBAXIN) 500 MG tablet  Take 500 mg by mouth every 6 (six) hours as needed. For muscle spasms  12/29/10  01/08/11   Etta Grandchild, MD       Results for orders placed during the hospital encounter of 07/05/11 (from the past 48 hour(s))  CBC     Status: Abnormal   Collection Time   07/05/11  4:01 PM      Component Value Range Comment   WBC 5.7  4.0 - 10.5 (K/uL)    RBC 3.82 (*) 3.87 - 5.11 (MIL/uL)    Hemoglobin 12.9  12.0 - 15.0 (g/dL)    HCT 16.1  09.6 - 04.5 (%)    MCV 98.7  78.0 - 100.0 (fL)    MCH 33.8  26.0 - 34.0 (pg)    MCHC 34.2  30.0 - 36.0 (g/dL)    RDW 40.9 (*) 81.1 - 15.5 (%)    Platelets 131 (*) 150 - 400 (K/uL)   DIFFERENTIAL     Status: Abnormal   Collection Time   07/05/11  4:01 PM      Component Value Range Comment   Neutrophils Relative 63  43 - 77 (%)    Neutro Abs 3.6  1.7 - 7.7 (K/uL)    Lymphocytes Relative 21  12 - 46 (%)    Lymphs Abs 1.2  0.7 - 4.0 (K/uL)    Monocytes Relative 13 (*) 3 - 12 (%)    Monocytes Absolute 0.8  0.1 - 1.0 (K/uL)    Eosinophils Relative 2  0 - 5 (%)    Eosinophils Absolute 0.1  0.0 - 0.7 (K/uL)    Basophils Relative 0  0 - 1 (%)    Basophils Absolute 0.0  0.0 - 0.1 (K/uL)   BASIC METABOLIC PANEL     Status:  Abnormal   Collection Time   07/05/11  4:01 PM      Component Value Range Comment   Sodium 135  135 - 145 (mEq/L)    Potassium 4.5  3.5 - 5.1 (mEq/L)    Chloride 98  96 - 112 (mEq/L)    CO2 23  19 - 32 (mEq/L)    Glucose, Bld 80  70 - 99 (mg/dL)    BUN 14  6 - 23 (mg/dL)    Creatinine, Ser 9.14  0.50 - 1.10 (mg/dL)    Calcium 8.5  8.4 - 10.5 (mg/dL)    GFR calc non Af Amer 54 (*) >90 (mL/min)    GFR calc Af Amer 62 (*) >90 (mL/min)   PROTIME-INR     Status: Abnormal   Collection Time   07/05/11  9:12 PM  Component Value Range Comment   Prothrombin Time 25.3 (*) 11.6 - 15.2 (seconds)    INR 2.26 (*) 0.00 - 1.49    PROTIME-INR     Status: Abnormal   Collection Time   07/06/11  6:05 AM      Component Value Range Comment   Prothrombin Time 24.2 (*) 11.6 - 15.2 (seconds)    INR 2.13 (*) 0.00 - 1.49    COMPREHENSIVE METABOLIC PANEL     Status: Abnormal   Collection Time   07/06/11  6:05 AM      Component Value Range Comment   Sodium 136  135 - 145 (mEq/L)    Potassium 3.7  3.5 - 5.1 (mEq/L)    Chloride 98  96 - 112 (mEq/L)    CO2 28  19 - 32 (mEq/L)    Glucose, Bld 130 (*) 70 - 99 (mg/dL)    BUN 22  6 - 23 (mg/dL)    Creatinine, Ser 9.14 (*) 0.50 - 1.10 (mg/dL)    Calcium 8.7  8.4 - 10.5 (mg/dL)    Total Protein 6.4  6.0 - 8.3 (g/dL)    Albumin 3.5  3.5 - 5.2 (g/dL)    AST 22  0 - 37 (U/L)    ALT 18  0 - 35 (U/L)    Alkaline Phosphatase 49  39 - 117 (U/L)    Total Bilirubin 0.7  0.3 - 1.2 (mg/dL)    GFR calc non Af Amer 35 (*) >90 (mL/min)    GFR calc Af Amer 40 (*) >90 (mL/min)   CBC     Status: Abnormal   Collection Time   07/06/11  6:05 AM      Component Value Range Comment   WBC 6.1  4.0 - 10.5 (K/uL)    RBC 3.57 (*) 3.87 - 5.11 (MIL/uL)    Hemoglobin 11.8 (*) 12.0 - 15.0 (g/dL)    HCT 78.2 (*) 95.6 - 46.0 (%)    MCV 99.2  78.0 - 100.0 (fL)    MCH 33.1  26.0 - 34.0 (pg)    MCHC 33.3  30.0 - 36.0 (g/dL)    RDW 21.3 (*) 08.6 - 15.5 (%)    Platelets 146 (*) 150 -  400 (K/uL)   TSH     Status: Abnormal   Collection Time   07/06/11  6:05 AM      Component Value Range Comment   TSH 24.719 (*) 0.350 - 4.500 (uIU/mL)     Dg Lumbar Spine Complete  07/05/2011  *RADIOLOGY REPORT*  Clinical Data: Low back pain after bending over.  Prior back surgeries, most recently December, 2012.  LUMBAR SPINE - COMPLETE 4+ VIEW  Comparison: 01/26/2011  Findings: Posterolateral rod pedicle screw fixation noted at L3-L4 - L5-S1. No hardware fracture is observed.  There may be minimal lucency around the right S1 screw.  Posterior decompression noted in the lumbar spine.  Bony demineralization is present.  Overall the degree of mild anterior subluxation of L5 on S1 is stable.  Compared to exam from 01/26/2011, there is a 15% superior endplate compression fracture at L2.  IMPRESSION:  1.  New 15% superior endplate compression fracture at L2. 2.  Otherwise unchanged appearance of the lumbar spine. 3. Per CMS PQRS reporting requirements (PQRS Measure 24): Given the patient's age of greater than 50 and the fracture site (hip, distal radius, or spine), the patient should be tested for osteoporosis using DXA, and the appropriate treatment considered based on the  DXA results.  Original Report Authenticated By: Dellia Cloud, M.D.    Review of Systems:  As per HPI and chief complaint. Patent denies fatigue, diminished appetite, weight loss, fever, chills, headache, blurred vision, difficulty in speaking, dysphagia, chest pain, cough, shortness of breath, orthopnea, paroxysmal nocturnal dyspnea, nausea, diaphoresis, abdominal pain, vomiting, diarrhea, belching, heartburn, hematemesis, melena, dysuria, nocturia, urinary frequency, hematochezia, lower extremity swelling, pain, or redness. The rest of the systems review is negative.    PE Blood pressure 95/61, pulse 61, temperature 97.7 F (36.5 C), temperature source Oral, resp. rate 18, height 5\' 4"  (1.626 m), SpO2 97.00%. Moves LE well  - 5/5 stength all, sensation intact Positive tenderness to middle low back  Assessment/Plan: Mild L2 compression fracture.  Pt needs to be on meds for osteoporosis.  Needs to use brace when OOB.  Will follow while in hospital and anticipate d/c home when able to ambulate (when pain decreased)  Will then follow as outpt with serial x-rays to eval fracture.  thanks  Clydene Fake, MD 07/06/2011, 5:28 PM

## 2011-07-07 DIAGNOSIS — S32009A Unspecified fracture of unspecified lumbar vertebra, initial encounter for closed fracture: Secondary | ICD-10-CM

## 2011-07-07 LAB — PROTIME-INR: Prothrombin Time: 31.2 seconds — ABNORMAL HIGH (ref 11.6–15.2)

## 2011-07-07 MED ORDER — DOCUSATE SODIUM 100 MG PO CAPS
100.0000 mg | ORAL_CAPSULE | Freq: Two times a day (BID) | ORAL | Status: DC
  Administered 2011-07-07 – 2011-07-08 (×3): 100 mg via ORAL
  Filled 2011-07-07 (×4): qty 1

## 2011-07-07 MED ORDER — SENNA 8.6 MG PO TABS
2.0000 | ORAL_TABLET | Freq: Every day | ORAL | Status: DC
  Filled 2011-07-07 (×3): qty 2

## 2011-07-07 MED ORDER — WARFARIN SODIUM 1 MG PO TABS
1.0000 mg | ORAL_TABLET | Freq: Once | ORAL | Status: AC
  Administered 2011-07-07: 1 mg via ORAL
  Filled 2011-07-07: qty 1

## 2011-07-07 MED ORDER — RALOXIFENE HCL 60 MG PO TABS
60.0000 mg | ORAL_TABLET | Freq: Every day | ORAL | Status: DC
  Administered 2011-07-07 – 2011-07-08 (×2): 60 mg via ORAL
  Filled 2011-07-07 (×2): qty 1

## 2011-07-07 MED ORDER — FENTANYL 12 MCG/HR TD PT72
12.5000 ug | MEDICATED_PATCH | TRANSDERMAL | Status: DC
  Administered 2011-07-07: 12.5 ug via TRANSDERMAL
  Filled 2011-07-07: qty 1

## 2011-07-07 NOTE — Progress Notes (Signed)
ANTICOAGULATION CONSULT NOTE - Follow Up Consult  Pharmacy Consult for : Coumadin Indication: atrial fibrillation  Allergies  Allergen Reactions  . Penicillins Swelling    Throat swells  . Prevacid Nausea Only  . Statins Other (See Comments)    cramps    Patient Measurements: Height: 5\' 4"  (162.6 cm) Weight: 150 lb (68.04 kg) IBW/kg (Calculated) : 54.7    Vital Signs: Temp: 98.7 F (37.1 C) (04/18 0900) Temp src: Oral (04/18 0900) BP: 107/49 mmHg (04/18 0900) Pulse Rate: 48  (04/18 0900)  Labs:  Basename 07/07/11 0500 07/06/11 0605 07/05/11 2112 07/05/11 1601  HGB -- 11.8* -- 12.9  HCT -- 35.4* -- 37.7  PLT -- 146* -- 131*  APTT -- -- -- --  LABPROT 31.2* 24.2* 25.3* --  INR 2.95* 2.13* 2.26* --  HEPARINUNFRC -- -- -- --  CREATININE -- 1.57* -- 1.09  CKTOTAL -- -- -- --  CKMB -- -- -- --  TROPONINI -- -- -- --   Estimated Creatinine Clearance: 35.6 ml/min (by C-G formula based on Cr of 1.57).   Medications:     acetaminophen 500 mg Oral TID  amiodarone 200 mg Oral Daily  aspirin EC 81 mg Oral Daily  calcium-vitamin D 1 tablet Oral BID WC  carvedilol 12.5 mg Oral BID WC  ezetimibe 10 mg Oral Daily  folic acid-pyridoxine-cyancobalamin 1 tablet Oral Daily  furosemide 20 mg Oral Daily  levothyroxine 150 mcg Oral Q0600  lisinopril 2.5 mg Oral BID  loratadine 10 mg Oral Daily  methocarbamol 500 mg Oral QID  omega-3 acid ethyl esters 1,000 mg Oral Q breakfast  oxyCODONE 5 mg Oral QID  pantoprazole 40 mg Oral BID  polyethylene glycol 17 g Oral Daily  potassium chloride SA 20 mEq Oral Daily  raloxifene 60 mg Oral Daily  sodium chloride 3 mL Intravenous Q12H  sodium chloride 3 mL Intravenous Q12H  warfarin 3 mg Oral q1800  Warfarin - Pharmacist Dosing Inpatient  Does not apply q1800     Assessment:  Patient is a 61 y/o female on chronic Coumadin 3 mg daily for history of A-fib.  Large overnight increase in INR  2.13  >>  2.95 while on home dose  schedule.  Patient did not eat well which may have influenced INR response.  Amiodarone dose continues as at home.  No evidence of bleeding.  Goal of Therapy:   INR 2-3   Plan:   Coumadin 1 mg po today.  Raymar Joiner, Elisha Headland, Pharm.D. 07/07/2011 11:15 AM

## 2011-07-07 NOTE — Progress Notes (Signed)
I called [employer] to see about getting a new strap for the pt's Vertalign back brace, they said they would take care of it for her, although they did not give me an estimate on when they would be by. [employer]'s number is [phone].

## 2011-07-07 NOTE — Consult Note (Signed)
Physical Medicine and Rehabilitation Consult Reason for Consult: Intractable back pain/compression fracture Referring Phsyician: Triad [name] is an 61 y.o. female.   HPI: 60 year old right-handed African American female well-known rehabilitation services with history of multiple back surgeries. Last rehabilitation admission was in December of 2011 Admitted April 16 when she noted severe low back pain when bending over limiting her overall mobility. X-ray and imaging revealed mild lumbar L2 compression fracture. She was seen by neurosurgery advised conservative care. She is placed in a TLSO brace that she had from previous surgeries. Pain management ongoing with low-dose Duragesic patch added today. Patient remains on chronic Coumadin therapy for history left ventricular mural thrombus. Physical and occupational therapy evaluation completed recommendation physical medicine rehabilitation consult to consider inpatient rehabilitation services  Review of Systems  Cardiovascular: Positive for palpitations.  Gastrointestinal: Positive for constipation.  Musculoskeletal: Positive for myalgias, back pain and joint pain.  All other systems reviewed and are negative.   Past Medical History  Diagnosis Date  . Spinal stenosis   . Numbness of foot     left foot  . Diverticulitis     Status post partial colectomy 1/12 with reversal in July 2012  . Colitis, ischemic   . DJD (degenerative joint disease)     History of multiple surgeries to the back, shoulder and knee  . Ischemic cardiomyopathy     Echo 06/16/10: EF 25-30%, anteroseptal and apical hypokinesis, moderate AI, mild MR  . Chronic systolic heart failure   . CAD (coronary artery disease)     a.  Ant MI 8/03 with stenting of the LAD;  b. staged PCI with Cypher DES to OM1 8/03;  c. s/p Cypher DES to LAD 7/04;  d.  multiple cardiac caths in past (chronically abnl ECG);    e. cardiac catheterization 3/12: LAD stent patent, distal LAD 40-50%,  small ostial D1 80%, ostial D2 60%, OM-1 stent patent (20-30%), proximal RCA 50%, proximal to mid RCA 40-50%; f. cath 02/17/2011 - nonobs  . LV (left ventricular) mural thrombus     Chronic Coumadin therapy  . CKD (chronic kidney disease), stage III     creatinine:  1.3 in 4/12;    . Tobacco abuse     history  . GERD (gastroesophageal reflux disease)   . Hypertension   . Lower GI bleed     August 2011  . Hypothyroidism   . Aortic insufficiency   . Pseudoaneurysm     History of, right forearm  . Hyperlipidemia   . OA (osteoarthritis)   . Abnormal EKG     Chronically abnormal EKG.   . Spinal stenosis   . Numbness of foot     Left foot  . Compression fracture 07/04/11    L2  . Anterior myocardial infarction 10/2001  . CHF (congestive heart failure)   . SVT (supraventricular tachycardia)     ? h/o AFib;  patient on long term amiodarone therapy  . PAF (paroxysmal atrial fibrillation)   . Anatomical narrow angle of right eye   . Heart murmur     "age 6-19"  . Shortness of breath     "at times; related to CHF"  . Shortness of breath on exertion   . 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 Graciela Husbands.   [name] AICD (automatic cardioverter/defibrillator) present 10/2004    explant; implant  . Blood transfusion   . Anemia   . H/O hiatal hernia   .  Stroke     History of TIA and possible CVA; hospitalized 2004; on coumadin   Past Surgical History  Procedure Date  . Back surgery   . Coronary angioplasty with stent placement 10/2001; 09/2002  . Cardiac defibrillator placement 07/2003; 10/2004  . Lumbar laminectomy/decompression microdiscectomy 10/2001    L5-S1/E-chart  . Knee arthroscopy     left  . Thyroidectomy 1970's  . Total knee arthroplasty 11/2003    left  . X-stop implantation 12/2004    L3-4; L4-5  . Fixation kyphoplasty thoracic spine 07/2007    T3, 4, 6 compression fractures  . Shoulder open rotator cuff repair 04/2008    right  . Lumbar  laminectomy/decompression microdiscectomy 01/2010  . Colectomy 03/2010    sigmoid left  . Colostomy 03/2010    transverse  . Peripherally inserted central catheter insertion 03/2010    removed upon discharge  . Colostomy closure 12/2010    reversal  . Tonsillectomy and adenoidectomy 1969  . Appendectomy 03/2010  . Cataract extraction     right   Family History  Problem Relation Age of Onset  . Diabetes Mother   . Diabetes Brother   . Arthritis      family history  . Prostate cancer      Family History   Social History:  reports that she quit smoking about 10 years ago. Her smoking use included Cigarettes. She has a 3.3 pack-year smoking history. She has never used smokeless tobacco. She reports that she drinks alcohol. She reports that she does not use illicit drugs. Allergies:  Allergies  Allergen Reactions  . Penicillins Swelling    Throat swells  . Prevacid Nausea Only  . Statins Other (See Comments)    cramps   Medications Prior to Admission  Medication Dose Route Frequency Provider Last Rate Last Dose  . 0.9 %  sodium chloride infusion  250 mL Intravenous PRN Laveda Norman, MD      . acetaminophen (TYLENOL) tablet 650 mg  650 mg Oral Q6H PRN Laveda Norman, MD       Or  . acetaminophen (TYLENOL) suppository 650 mg  650 mg Rectal Q6H PRN Laveda Norman, MD      . acetaminophen (TYLENOL) tablet 500 mg  500 mg Oral TID Sorin Luanne Bras, MD   500 mg at 07/07/11 0957  . amiodarone (PACERONE) tablet 200 mg  200 mg Oral Daily Laveda Norman, MD      . aspirin EC tablet 81 mg  81 mg Oral Daily Laveda Norman, MD   81 mg at 07/07/11 0957  . busPIRone (BUSPAR) tablet 15 mg  15 mg Oral Daily PRN Laveda Norman, MD      . calcium-vitamin D (OSCAL WITH D) 500-200 MG-UNIT per tablet 1 tablet  1 tablet Oral BID WC Laveda Norman, MD   1 tablet at 07/07/11 0957  . carvedilol (COREG) tablet 12.5 mg  12.5 mg Oral BID WC Laveda Norman, MD      . docusate sodium (COLACE) capsule 100 mg  100 mg Oral BID Sorin Luanne Bras,  MD      . ezetimibe (ZETIA) tablet 10 mg  10 mg Oral Daily Laveda Norman, MD   10 mg at 07/07/11 1013  . fentaNYL (DURAGESIC - dosed mcg/hr) 12.5 mcg  12.5 mcg Transdermal Q72H Sorin C Laza, MD      . fluticasone (FLONASE) 50 MCG/ACT nasal spray 2 spray  2 spray Each Nare  Daily PRN Laveda Norman, MD      . folic acid-pyridoxine-cyancobalamin (FOLTX) 2.5-25-2 MG per tablet 1 tablet  1 tablet Oral Daily Laveda Norman, MD   1 tablet at 07/07/11 0957  . furosemide (LASIX) tablet 20 mg  20 mg Oral Daily Laveda Norman, MD   20 mg at 07/07/11 0957  . HYDROcodone-acetaminophen (NORCO) 5-325 MG per tablet 2 tablet  2 tablet Oral Q4H PRN Sorin Luanne Bras, MD   2 tablet at 07/07/11 0736  . HYDROmorphone (DILAUDID) injection 0.5 mg  0.5 mg Intravenous Q4H PRN Laveda Norman, MD   0.5 mg at 07/06/11 1832  . HYDROmorphone (DILAUDID) injection 2 mg  2 mg Intramuscular Once Dione Booze, MD   2 mg at 07/05/11 1740  . levothyroxine (SYNTHROID, LEVOTHROID) tablet 150 mcg  150 mcg Oral Q0600 Laveda Norman, MD   150 mcg at 07/07/11 [phone]  . lisinopril (PRINIVIL,ZESTRIL) tablet 2.5 mg  2.5 mg Oral BID Laveda Norman, MD   2.5 mg at 07/06/11 2132  . loratadine (CLARITIN) tablet 10 mg  10 mg Oral Daily Laveda Norman, MD   10 mg at 07/07/11 0957  . methocarbamol (ROBAXIN) tablet 500 mg  500 mg Oral QID Laveda Norman, MD   500 mg at 07/07/11 1342  . nitroGLYCERIN (NITROSTAT) SL tablet 0.4 mg  0.4 mg Sublingual Q5 min PRN Laveda Norman, MD      . omega-3 acid ethyl esters (LOVAZA) capsule 1,000 mg  1,000 mg Oral Q breakfast Laveda Norman, MD   1,000 mg at 07/07/11 0956  . ondansetron (ZOFRAN) tablet 4 mg  4 mg Oral Q6H PRN Laveda Norman, MD       Or  . ondansetron [facility]) injection 4 mg  4 mg Intravenous Q6H PRN Laveda Norman, MD      . ondansetron (ZOFRAN-ODT) disintegrating tablet 8 mg  8 mg Oral Once Dione Booze, MD   8 mg at 07/05/11 1738  . oxyCODONE (Oxy IR/ROXICODONE) immediate release tablet 5 mg  5 mg Oral QID Laveda Norman, MD   5 mg at 07/07/11  1342  . pantoprazole (PROTONIX) EC tablet 40 mg  40 mg Oral BID Laveda Norman, MD   40 mg at 07/07/11 0956  . polyethylene glycol (MIRALAX / GLYCOLAX) packet 17 g  17 g Oral Daily Laveda Norman, MD   17 g at 07/07/11 0957  . potassium chloride SA (K-DUR,KLOR-CON) CR tablet 20 mEq  20 mEq Oral Daily Laveda Norman, MD   20 mEq at 07/07/11 0956  . promethazine (PHENERGAN) injection 12.5 mg  12.5 mg Intravenous Q6H PRN Laveda Norman, MD      . promethazine (PHENERGAN) injection 12.5 mg  12.5 mg Intravenous To Minor Laveda Norman, MD   12.5 mg at 07/05/11 2139  . raloxifene (EVISTA) tablet 60 mg  60 mg Oral Daily Sorin Luanne Bras, MD   60 mg at 07/07/11 1343  . senna (SENOKOT) tablet 17.2 mg  2 tablet Oral QHS Sorin C Laza, MD      . sodium chloride 0.9 % injection 3 mL  3 mL Intravenous Q12H Laveda Norman, MD   3 mL at 07/07/11 1015  . sodium chloride 0.9 % injection 3 mL  3 mL Intravenous Q12H Laveda Norman, MD   3 mL at 07/06/11 2149  . sodium chloride 0.9 % injection 3 mL  3 mL Intravenous PRN Thayer Ohm  Sammuel Cooper, MD      . warfarin (COUMADIN) tablet 1 mg  1 mg Oral ONCE-1800 Sorin C Laza, MD      . warfarin (COUMADIN) tablet 3 mg  3 mg Oral q1800 Elonda Husky, PHARMD   3 mg at 07/06/11 1729  . Warfarin - Pharmacist Dosing Inpatient   Does not apply q1800 Elonda Husky, PHARMD      . DISCONTD: 0.9 %  sodium chloride infusion   Intravenous Continuous Donnetta Hutching, MD      . DISCONTD: HYDROmorphone (DILAUDID) injection 0.5 mg  0.5 mg Intravenous Once Donnetta Hutching, MD      . DISCONTD: ondansetron [facility]) injection 4 mg  4 mg Intravenous Once Donnetta Hutching, MD       Medications Prior to Admission  Medication Sig Dispense Refill  . aspirin 81 MG EC tablet Take 81 mg by mouth daily.        . busPIRone (BUSPAR) 15 MG tablet Take 15 mg by mouth daily as needed. For mood stabilization.      . cyclobenzaprine (FLEXERIL) 5 MG tablet Take 5 mg by mouth 2 (two) times daily as needed. For muscle spasms.      . fexofenadine (ALLEGRA)  180 MG tablet Take 180 mg by mouth daily.       . fish oil-omega-3 fatty acids 1000 MG capsule Take 1,000 mg by mouth daily.        . fluticasone (FLONASE) 50 MCG/ACT nasal spray Place 2 sprays into the nose daily as needed. For allergies       . folic acid-pyridoxine-cyancobalamin (FOLTX) 2.5-25-2 MG TABS Take 1 tablet by mouth daily.        . furosemide (LASIX) 20 MG tablet Take 20 mg by mouth daily.        [name] HYDROcodone-acetaminophen (NORCO) 5-325 MG per tablet Take 1 tablet by mouth every 4 (four) hours as needed. For pain       . lisinopril (PRINIVIL,ZESTRIL) 2.5 MG tablet Take 2.5 mg by mouth 2 (two) times daily.       . methocarbamol (ROBAXIN) 500 MG tablet Take 500 mg by mouth 4 (four) times daily as needed. For spasms       . nitroGLYCERIN (NITROSTAT) 0.4 MG SL tablet Place 0.4 mg under the tongue every 5 (five) minutes as needed. For chest pain       . oxyCODONE-acetaminophen (PERCOCET) 5-325 MG per tablet Take 1 tablet by mouth every 4 (four) hours as needed. For pain       . pantoprazole (PROTONIX) 40 MG tablet Take 40 mg by mouth 2 (two) times daily.       . potassium chloride SA (K-DUR,KLOR-CON) 20 MEQ tablet Take 20 mEq by mouth daily.          Home: Home Living Lives With: Spouse Available Help at Discharge: Family;Available 24 hours/day (Brother) Type of Home: House Home Access: Stairs to enter [employer] of Steps: 2 Entrance Stairs-Rails: Left Home Layout: One level Bathroom Shower/Tub: Tub/shower unit;Curtain Bathroom Toilet: Handicapped height Bathroom Accessibility: Yes How Accessible: Accessible via walker Home Adaptive Equipment: Bedside commode/3-in-1;Shower chair with back;Walker - rolling;Reacher  Functional History: Prior Function Able to Take Stairs?: Yes Driving: Yes Vocation: Retired Functional Status:  Mobility: Bed Mobility Bed Mobility: Yes Rolling Right: 4: Min assist;With rail Rolling Left: 5: Supervision;With rail Rolling Left  Details (indicate cue type and reason): cues for log roll and no twisting Right Sidelying to Sit: HOB elevated (comment  degrees);4: Min assist (60) Right Sidelying to Sit Details (indicate cue type and reason): Initially attempt to move sidelying to sit, only achieved 3/4 motion and pt. limited by pain 10/10.  Returned to sidelying, and slowly increased the HOB to 60 and pt then able to transition to sitting Left Sidelying to Sit: 1: +2 Total assist;Patient percentage (comment);HOB flat (pt 50%) Left Sidelying to Sit Details (indicate cue type and reason): cues for sequencing and use of UEs.   Sitting - Scoot to Edge of Bed: 6: Modified independent (Device/Increase time) Sit to Sidelying Right: 4: Min assist;With rail;HOB elevated (comment degrees) (60) Transfers Transfers: Yes Sit to Stand: 4: Min assist;With upper extremity assist;From bed;From elevated surface Sit to Stand Details (indicate cue type and reason): encouragment needed. Stand to Sit: 4: Min assist;With upper extremity assist;With armrests;To chair/3-in-1 Stand to Sit Details: cues to go down slowly and reach for arm rests. Stand Pivot Transfers: 4: Min Actuary Details (indicate cue type and reason): cues for encouragement and movement of RW Ambulation/Gait Ambulation/Gait: No Stairs: No Wheelchair Mobility Wheelchair Mobility: No  ADL: ADL Eating/Feeding: Simulated Where Assessed - Eating/Feeding: Bed level Grooming: Performed;Wash/dry hands;Wash/dry face;Teeth care;Brushing hair;Set up Grooming Details (indicate cue type and reason): PT declined standing at sink to do grooming Where Assessed - Grooming: Sitting, chair Upper Body Bathing: Simulated;Minimal assistance Where Assessed - Upper Body Bathing: Supine, head of bed flat Lower Body Bathing: Simulated;+1 Total assistance Where Assessed - Lower Body Bathing: Rolling right and/or left Upper Body Dressing: Performed;Minimal assistance Upper Body  Dressing Details (indicate cue type and reason): pt donned second gown with min assist.  Total assist for brace. Where Assessed - Upper Body Dressing: Sitting, bed;Unsupported Lower Body Dressing: Performed;Moderate assistance Lower Body Dressing Details (indicate cue type and reason): Pt donned and doffed sock wo equipment with set up.  Pt needed assist to stand and pull pants up.  Pt with difficulty letting go of walker to pull pants up.   Where Assessed - Lower Body Dressing: Sit to stand from chair Toilet Transfer: Performed;Minimal assistance Toilet Transfer Details (indicate cue type and reason): pivot to chair.  Pt does better when someone encourages her and keeps her calm.  Pt did not think she could stand up straight but she stood in place with walker for 1.5 minutes with walker. Toilet Transfer Method: Proofreader: Programme researcher, broadcasting/film/video Manipulation: Simulated;Maximal assistance Toileting - Clothing Manipulation Details (indicate cue type and reason): Pt unable to let go of walker for long enough to pull pants up without assist. Where Assessed - Toileting Clothing Manipulation: Standing Toileting - Hygiene: Performed;Supervision/safety Toileting - Hygiene Details (indicate cue type and reason): no bowel care performed. Where Assessed - Toileting Hygiene: Sit on 3-in-1 or toilet Tub/Shower Transfer: Not assessed Equipment Used: Rolling walker Ambulation Related to ADLs: Pt took four steps with rolling walker.  Pt gets anxious. Does well when calmed down. ADL Comments: Pt did better with LE adls today.  Pt was able to cross both legs up to knees for LE dressing.  Cognition: Cognition Arousal/Alertness: Awake/alert Orientation Level: Oriented X4 Cognition Overall Cognitive Status: Appears within functional limits for tasks assessed Arousal/Alertness: Awake/alert Behavior During Session: [facility] for tasks performed  Blood pressure 107/49, pulse  48, temperature 98.7 F (37.1 C), temperature source Oral, resp. rate 17, height 5\' 4"  (1.626 m), weight 68.04 kg (150 lb), SpO2 97.00%. Physical Exam  Vitals reviewed. Constitutional: She is oriented to person, place,  and time. She appears well-developed.  HENT:  Head: Normocephalic.  Neck: Normal range of motion. Neck supple. No thyromegaly present.  Cardiovascular: Regular rhythm.   Pulmonary/Chest: Breath sounds normal. She has no wheezes.  Abdominal: She exhibits no distension. There is no tenderness.  Musculoskeletal: She exhibits no edema.  Neurological: She is alert and oriented to person, place, and time.  Skin: Skin is warm and dry.  Psychiatric: She has a normal mood and affect.   motor strength is 4/5 in bilateral hip flexors knee extensors ankle dorsiflexors Motor strength is 5/5 in bilateral deltoid, biceps, triceps, grip Sensory exam shows decreased left L4, L5 and S1 dermatomal distribution sensory loss  Results for orders placed during the hospital encounter of 07/05/11 (from the past 24 hour(s))  PROTIME-INR     Status: Abnormal   Collection Time   07/07/11  5:00 AM      Component Value Range   Prothrombin Time 31.2 (*) 11.6 - 15.2 (seconds)   INR 2.95 (*) 0.00 - 1.49    Dg Lumbar Spine Complete  07/05/2011  *RADIOLOGY REPORT*  Clinical Data: Low back pain after bending over.  Prior back surgeries, most recently December, 2012.  LUMBAR SPINE - COMPLETE 4+ VIEW  Comparison: 01/26/2011  Findings: Posterolateral rod pedicle screw fixation noted at L3-L4 - L5-S1. No hardware fracture is observed.  There may be minimal lucency around the right S1 screw.  Posterior decompression noted in the lumbar spine.  Bony demineralization is present.  Overall the degree of mild anterior subluxation of L5 on S1 is stable.  Compared to exam from 01/26/2011, there is a 15% superior endplate compression fracture at L2.  IMPRESSION:  1.  New 15% superior endplate compression fracture at L2. 2.   Otherwise unchanged appearance of the lumbar spine. 3. Per CMS PQRS reporting requirements (PQRS Measure 24): Given the patient's age of greater than 50 and the fracture site (hip, distal radius, or spine), the patient should be tested for osteoporosis using DXA, and the appropriate treatment considered based on the DXA results.  Original Report Authenticated By: Dellia Cloud, M.D.    Assessment/Plan: Diagnosis: L2 compression fracture with osteoporosis 1. Does the need for close, 24 hr/day medical supervision in concert with the patient's rehab needs make it unreasonable for this patient to be served in a less intensive setting? Yes 2. Co-Morbidities requiring supervision/potential complications: Atrial fibrillation, hypertension, congestive heart failure, chronic kidney disease, history spinal stenosis, chronic radiculopathy left L4-5 and S1 3. Due to bladder management, bowel management, safety, skin/wound care, disease management, medication administration, pain management and patient education, does the patient require 24 hr/day rehab nursing? Yes 4. Does the patient require coordinated care of a physician, rehab nurse, PT (1-2 hrs/day, 5 days/week) and OT (1-2 hrs/day, 5 days/week) to address physical and functional deficits in the context of the above medical diagnosis(es)? Yes Addressing deficits in the following areas: balance, endurance, locomotion, strength, transferring, bowel/bladder control, bathing, dressing and toileting 5. Can the patient actively participate in an intensive therapy program of at least 3 hrs of therapy per day at least 5 days per week? Yes 6. The potential for patient to make measurable gains while on inpatient rehab is excellent 7. Anticipated functional outcomes upon discharge from inpatient rehab are modified independent mobility with PT, modified independent ADLs with OT, not applicable with SLP. 8. Estimated rehab length of stay to reach the above  functional goals is: 7-10 days 9. Does the patient have adequate social  supports to accommodate these discharge functional goals? Yes 10. Anticipated D/C setting: Home 11. Anticipated post D/C treatments: HH therapy 12. Overall Rehab/Functional Prognosis: excellent  RECOMMENDATIONS: This patient's condition is appropriate for continued rehabilitative care in the following setting: CIR Patient has agreed to participate in recommended program. Yes Note that insurance prior authorization may be required for reimbursement for recommended care.  Comment: Patient indicates that family can provide housekeeping as well as shopping for her   ANGIULLI,DANIEL J. 07/07/2011

## 2011-07-07 NOTE — Progress Notes (Signed)
Occupational Therapy Treatment Patient Details Name: [name] MRN: [id number] DOB: [birth year] Today's Date: 07/07/2011 SC2  [id number] OT Assessment/Plan OT Assessment/Plan Comments on Treatment Session: Pt with increased independence today.  Pt needs to be mod I at home therefore needs further rehab and more encouragement to do for herself. OT Plan: Discharge plan remains appropriate OT Frequency: Min 2X/week Recommendations for Other Services: Rehab consult Follow Up Recommendations: Inpatient Rehab;Supervision/Assistance - 24 hour Equipment Recommended: Defer to next venue OT Goals Acute Rehab OT Goals OT Goal Formulation: With patient Time For Goal Achievement: 7 days ADL Goals Pt Will Perform Grooming: with min assist;Standing at sink ADL Goal: Grooming - Progress: Progressing toward goals Pt Will Perform Upper Body Dressing: with set-up;Sitting, bed ADL Goal: Upper Body Dressing - Progress: Progressing toward goals Pt Will Perform Lower Body Dressing: Sit to stand from bed;Sit to stand from chair;with min assist;with adaptive equipment ADL Goal: Lower Body Dressing - Progress: Progressing toward goals Pt Will Transfer to Toilet: with min assist;3-in-1;Stand pivot transfer ADL Goal: Toilet Transfer - Progress: Met Pt Will Perform Toileting - Clothing Manipulation: with min assist;Standing ADL Goal: Toileting - Clothing Manipulation - Progress: Progressing toward goals Pt Will Perform Toileting - Hygiene: with supervision;Sitting on 3-in-1 or toilet ADL Goal: Toileting - Hygiene - Progress: Met  OT Treatment Precautions/Restrictions  Precautions Precautions: Back Precaution Booklet Issued: Yes (comment) Precaution Comments: Reviewed back precautions Required Braces or Orthoses: Spinal Brace Spinal Brace: Thoracolumbosacral orthotic;Applied in sitting position Restrictions Weight Bearing Restrictions: No   ADL ADL Grooming: Performed;Wash/dry hands;Wash/dry  face;Teeth care;Brushing hair;Set up Grooming Details (indicate cue type and reason): PT declined standing at sink to do grooming Where Assessed - Grooming: Sitting, chair Upper Body Dressing: Performed;Minimal assistance Upper Body Dressing Details (indicate cue type and reason): pt donned second gown with min assist.  Total assist for brace. Where Assessed - Upper Body Dressing: Sitting, bed;Unsupported Lower Body Dressing: Performed;Moderate assistance Lower Body Dressing Details (indicate cue type and reason): Pt donned and doffed sock wo equipment with set up.  Pt needed assist to stand and pull pants up.  Pt with difficulty letting go of walker to pull pants up.   Where Assessed - Lower Body Dressing: Sit to stand from chair Toilet Transfer: Performed;Minimal assistance Toilet Transfer Details (indicate cue type and reason): pivot to chair.  Pt does better when someone encourages her and keeps her calm.  Pt did not think she could stand up straight but she stood in place with walker for 1.5 minutes with walker. Toilet Transfer Method: Proofreader: Programme researcher, broadcasting/film/video Manipulation: Simulated;Maximal assistance Toileting - Clothing Manipulation Details (indicate cue type and reason): Pt unable to let go of walker for long enough to pull pants up without assist. Where Assessed - Toileting Clothing Manipulation: Standing Toileting - Hygiene: Performed;Supervision/safety Toileting - Hygiene Details (indicate cue type and reason): no bowel care performed. Where Assessed - Toileting Hygiene: Sit on 3-in-1 or toilet Tub/Shower Transfer: Not assessed Equipment Used: Rolling walker Ambulation Related to ADLs: Pt took four steps with rolling walker.  Pt gets anxious. Does well when calmed down. ADL Comments: Pt did better with LE adls today.  Pt was able to cross both legs up to knees for LE dressing. Mobility  Bed Mobility Bed Mobility: Yes Rolling  Left: 5: Supervision;With rail Rolling Left Details (indicate cue type and reason): cues for log roll and no twisting Left Sidelying to Sit: 1: +2 Total assist;Patient percentage (comment);HOB flat (  pt 50%) Left Sidelying to Sit Details (indicate cue type and reason): cues for sequencing and use of UEs.   Sitting - Scoot to Edge of Bed: 6: Modified independent (Device/Increase time) Transfers Transfers: Yes Sit to Stand: 4: Min assist;With upper extremity assist;From bed;From elevated surface Sit to Stand Details (indicate cue type and reason): encouragment needed. Stand to Sit: 4: Min assist;With upper extremity assist;With armrests;To chair/3-in-1 Stand to Sit Details: cues to go down slowly and reach for arm rests. Exercises    End of Session OT - End of Session Equipment Utilized During Treatment: Back brace Activity Tolerance: Patient limited by pain Patient left: in chair;with call bell in reach Nurse Communication: Mobility status for transfers General Behavior During Session: [facility] for tasks performed Cognition: [facility] for tasks performed  Hope Budds [phone] 07/07/2011, 12:40 PM

## 2011-07-07 NOTE — Progress Notes (Signed)
Subjective: Still with 10/10 back pain with minimal movement   Physical Exam: Blood pressure 98/56, pulse 49, temperature 98.2 F (36.8 C), temperature source Oral, resp. rate 18, height 5\' 4"  (1.626 m), weight 68.04 kg (150 lb), SpO2 100.00%. Patient Vitals for the past 24 hrs:  BP Temp Temp src Pulse Resp SpO2 Height Weight  07/07/11 1424 98/56 mmHg 98.2 F (36.8 C) Oral 49  18  100 % - -  07/07/11 0900 107/49 mmHg 98.7 F (37.1 C) Oral 48  17  97 % - -  07/07/11 0455 92/55 mmHg 97.7 F (36.5 C) Oral 52  18  92 % - -  07/06/11 2122 101/61 mmHg 98.4 F (36.9 C) Oral 60  18  100 % 5\' 4"  (1.626 m) 68.04 kg (150 lb)  07/06/11 1900 132/87 mmHg 98.6 F (37 C) Oral 87  18  98 % - -    Alert and oriented x3 CVS: RRR RS: CTAB Abdomen: soft, nt   Investigations:  No results found for this or any previous visit (from the past 240 hour(s)).   Basic Metabolic Panel:  Basename 07/06/11 0605 07/05/11 1601  NA 136 135  K 3.7 4.5  CL 98 98  CO2 28 23  GLUCOSE 130* 80  BUN 22 14  CREATININE 1.57* 1.09  CALCIUM 8.7 8.5  MG -- --  PHOS -- --   Liver Function Tests:  [facility] 07/06/11 0605  AST 22  ALT 18  ALKPHOS 49  BILITOT 0.7  PROT 6.4  ALBUMIN 3.5     CBC:  Basename 07/06/11 0605 07/05/11 1601  WBC 6.1 5.7  NEUTROABS -- 3.6  HGB 11.8* 12.9  HCT 35.4* 37.7  MCV 99.2 98.7  PLT 146* 131*    No results found.    Medications:  Scheduled:    . acetaminophen  500 mg Oral TID  . amiodarone  200 mg Oral Daily  . aspirin EC  81 mg Oral Daily  . calcium-vitamin D  1 tablet Oral BID WC  . carvedilol  12.5 mg Oral BID WC  . docusate sodium  100 mg Oral BID  . ezetimibe  10 mg Oral Daily  . fentaNYL  12.5 mcg Transdermal Q72H  . folic acid-pyridoxine-cyancobalamin  1 tablet Oral Daily  . furosemide  20 mg Oral Daily  . levothyroxine  150 mcg Oral Q0600  . lisinopril  2.5 mg Oral BID  . loratadine  10 mg Oral Daily  . methocarbamol  500 mg Oral QID  .  omega-3 acid ethyl esters  1,000 mg Oral Q breakfast  . oxyCODONE  5 mg Oral QID  . pantoprazole  40 mg Oral BID  . polyethylene glycol  17 g Oral Daily  . potassium chloride SA  20 mEq Oral Daily  . raloxifene  60 mg Oral Daily  . senna  2 tablet Oral QHS  . sodium chloride  3 mL Intravenous Q12H  . sodium chloride  3 mL Intravenous Q12H  . warfarin  1 mg Oral ONCE-1800  . warfarin  3 mg Oral q1800  . Warfarin - Pharmacist Dosing Inpatient   Does not apply q1800   Continuous:   sodium chloride, acetaminophen, acetaminophen, busPIRone, fluticasone, HYDROcodone-acetaminophen, HYDROmorphone, nitroGLYCERIN, ondansetron (ZOFRAN) IV, ondansetron, promethazine, sodium chloride  Impression:  Principal Problem:  *L2 vertebral fracture Active Problems:  HYPOTHYROIDISM  HYPERLIPIDEMIA  HYPERTENSION  MYOCARDIAL INFARCTION, HX OF  Chronic systolic heart failure  CKD (chronic kidney disease)  implantable cardiac defibrillator- [employer]  Scientific-single-chamber  CAD (coronary artery disease)  HTN (hypertension)  PAF (paroxysmal atrial fibrillation)  Dyslipidemia   Plan: Start duragesic patch for pain  continue schedule tylenol around the clock Prn vicodin for breakthrough pain Mobilize with brace ? CIR   Continue home meds for CAD, etc   Start Evista for osteoporosis     LOS: 2 days   Tahji Ionia, MD Pager: [phone] 07/07/2011, 3:25 PM

## 2011-07-07 NOTE — Evaluation (Signed)
Physical Therapy Evaluation Patient Details Name: CHARNIKA HERBST MRN: [id number] DOB: 05/21/1950 Today's Date: 07/07/2011  Problem List:  Patient Active Problem List  Diagnoses  . HYPOTHYROIDISM  . HYPERLIPIDEMIA  . HYPERTENSION  . MYOCARDIAL INFARCTION, HX OF  . Chronic systolic heart failure  . OSTEOARTHRITIS  . Spinal stenosis  . Numbness of foot  . Ischemia, bowel  . Ischemic cardiomyopathy  . Chronic anticoagulation  . Stroke  . Arthritis  . CKD (chronic kidney disease)  . Tobacco abuse  . GERD (gastroesophageal reflux disease)  . LV (left ventricular) mural thrombus  . [employer] AICD  . Atrial fibrillation  . implantable cardiac defibrillator- Engineering geologist  . Anemia  . Unstable angina  . CAD (coronary artery disease)  . Unspecified disorder of urethra and urinary tract  . Carotid stenosis  . Back pain  . L2 vertebral fracture  . HTN (hypertension)  . PAF (paroxysmal atrial fibrillation)  . Dyslipidemia    Past Medical History:  Past Medical History  Diagnosis Date  . Spinal stenosis   . Numbness of foot     left foot  . Diverticulitis     Status post partial colectomy 1/12 with reversal in July 2012  . Colitis, ischemic   . DJD (degenerative joint disease)     History of multiple surgeries to the back, shoulder and knee  . Ischemic cardiomyopathy     Echo 06/16/10: EF 25-30%, anteroseptal and apical hypokinesis, moderate AI, mild MR  . Chronic systolic heart failure   . CAD (coronary artery disease)     a.  Ant MI 8/03 with stenting of the LAD;  b. staged PCI with Cypher DES to OM1 8/03;  c. s/p Cypher DES to LAD 7/04;  d.  multiple cardiac caths in past (chronically abnl ECG);    e. cardiac catheterization 3/12: LAD stent patent, distal LAD 40-50%, small ostial D1 80%, ostial D2 60%, OM-1 stent patent (20-30%), proximal RCA 50%, proximal to mid RCA 40-50%; f. cath 02/17/2011 - nonobs  . LV (left ventricular) mural thrombus    Chronic Coumadin therapy  . CKD (chronic kidney disease), stage III     creatinine:  1.3 in 4/12;    . Tobacco abuse     history  . GERD (gastroesophageal reflux disease)   . Hypertension   . Lower GI bleed     August 2011  . Hypothyroidism   . Aortic insufficiency   . Pseudoaneurysm     History of, right forearm  . Hyperlipidemia   . OA (osteoarthritis)   . Abnormal EKG     Chronically abnormal EKG.   . Spinal stenosis   . Numbness of foot     Left foot  . Compression fracture 07/04/11    L2  . Anterior myocardial infarction 10/2001  . CHF (congestive heart failure)   . SVT (supraventricular tachycardia)     ? h/o AFib;  patient on long term amiodarone therapy  . PAF (paroxysmal atrial fibrillation)   . Anatomical narrow angle of right eye   . Heart murmur     "age 41-19"  . Shortness of breath     "at times; related to CHF"  . Shortness of breath on exertion   . 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 Graciela Husbands.   [name] AICD (automatic cardioverter/defibrillator) present 10/2004    explant; implant  . Blood transfusion   . Anemia   . H/O hiatal hernia   .  Stroke     History of TIA and possible CVA; hospitalized 2004; on coumadin   Past Surgical History:  Past Surgical History  Procedure Date  . Back surgery   . Coronary angioplasty with stent placement 10/2001; 09/2002  . Cardiac defibrillator placement 07/2003; 10/2004  . Lumbar laminectomy/decompression microdiscectomy 10/2001    L5-S1/E-chart  . Knee arthroscopy     left  . Thyroidectomy 1970's  . Total knee arthroplasty 11/2003    left  . X-stop implantation 12/2004    L3-4; L4-5  . Fixation kyphoplasty thoracic spine 07/2007    T3, 4, 6 compression fractures  . Shoulder open rotator cuff repair 04/2008    right  . Lumbar laminectomy/decompression microdiscectomy 01/2010  . Colectomy 03/2010    sigmoid left  . Colostomy 03/2010    transverse  . Peripherally inserted central  catheter insertion 03/2010    removed upon discharge  . Colostomy closure 12/2010    reversal  . Tonsillectomy and adenoidectomy 1969  . Appendectomy 03/2010  . Cataract extraction     right    PT Assessment/Plan/Recommendation PT Assessment Clinical Impression Statement: pt presents with Lumbar Compression fx and history of Compression fxs.  pt mobility limited by pain and needs encouragement to increase activity.   PT Recommendation/Assessment: Patient will need skilled PT in the acute care venue PT Problem List: Decreased activity tolerance;Decreased balance;Decreased mobility;Decreased knowledge of use of DME;Decreased knowledge of precautions;Pain Barriers to Discharge: None PT Therapy Diagnosis : Difficulty walking;Acute pain PT Plan PT Frequency: Min 5X/week PT Treatment/Interventions: DME instruction;Gait training;Stair training;Functional mobility training;Therapeutic activities;Therapeutic exercise;Balance training;Patient/family education PT Recommendation Recommendations for Other Services: Rehab consult Follow Up Recommendations: Inpatient Rehab Equipment Recommended: Defer to next venue PT Goals  Acute Rehab PT Goals PT Goal Formulation: With patient Time For Goal Achievement: 2 weeks Pt will Roll Supine to Right Side: Independently PT Goal: Rolling Supine to Right Side - Progress: Goal set today Pt will Roll Supine to Left Side: Independently PT Goal: Rolling Supine to Left Side - Progress: Goal set today Pt will go Supine/Side to Sit: Independently PT Goal: Supine/Side to Sit - Progress: Goal set today Pt will go Sit to Supine/Side: Independently PT Goal: Sit to Supine/Side - Progress: Goal set today Pt will go Sit to Stand: with modified independence;with upper extremity assist PT Goal: Sit to Stand - Progress: Goal set today Pt will go Stand to Sit: with modified independence;with upper extremity assist PT Goal: Stand to Sit - Progress: Goal set today Pt will  Ambulate: >150 feet;with modified independence;with rolling walker PT Goal: Ambulate - Progress: Goal set today Pt will Go Up / Down Stairs: 3-5 stairs;with min assist;with least restrictive assistive device PT Goal: Up/Down Stairs - Progress: Goal set today Additional Goals Additional Goal #1: pt will verbalize and follow all back precautions.   PT Goal: Additional Goal #1 - Progress: Goal set today  PT Evaluation Precautions/Restrictions  Precautions Precautions: Back Precaution Booklet Issued: Yes (comment) Precaution Comments: Reviewed back precautions Required Braces or Orthoses: Spinal Brace Spinal Brace: Thoracolumbosacral orthotic;Applied in sitting position Restrictions Weight Bearing Restrictions: No Prior Functioning  Home Living Lives With: Spouse Available Help at Discharge: Family;Available 24 hours/day (Brother) Type of Home: House Home Access: Stairs to enter [employer] of Steps: 2 Entrance Stairs-Rails: Left Home Layout: One level Bathroom Shower/Tub: Tub/shower unit;Curtain Bathroom Toilet: Handicapped height Bathroom Accessibility: Yes How Accessible: Accessible via walker Home Adaptive Equipment: Bedside commode/3-in-1;Shower chair with back;Walker - rolling;Reacher Prior Function Level  of Independence: Independent Able to Take Stairs?: Yes Driving: Yes Vocation: Retired Art therapist   Extremity Assessment RLE Assessment RLE Assessment: Within Functional Limits LLE Assessment LLE Assessment: Within Functional Limits Mobility (including Balance) Bed Mobility Bed Mobility: Yes Rolling Left: 5: Supervision;With rail Rolling Left Details (indicate cue type and reason): cues for log roll and no twisting Left Sidelying to Sit: 1: +2 Total assist;Patient percentage (comment);HOB flat (pt 50%) Left Sidelying to Sit Details (indicate cue type and reason): cues for sequencing and use of UEs.   Sitting - Scoot to Edge of  Bed: 6: Modified independent (Device/Increase time) Transfers Transfers: Yes Sit to Stand: 4: Min assist;With upper extremity assist;From bed;From elevated surface (Elevated ht of bed) Sit to Stand Details (indicate cue type and reason): cues for UE use, LE positioning, anterior wt shift and encouragement Stand to Sit: 4: Min assist;With upper extremity assist;With armrests;To chair/3-in-1 Stand to Sit Details: cues to get closer to chair, use of armrests and to control descent Stand Pivot Transfers: 4: Min assist Stand Pivot Transfer Details (indicate cue type and reason): cues for encouragement and movement of RW Ambulation/Gait Ambulation/Gait: No Stairs: No Wheelchair Mobility Wheelchair Mobility: No  Posture/Postural Control Posture/Postural Control: No significant limitations Balance Balance Assessed: No Exercise    End of Session PT - End of Session Equipment Utilized During Treatment: Gait belt;Back brace Activity Tolerance: Patient tolerated treatment well;Patient limited by pain Patient left: in chair;with call bell in reach Nurse Communication: Mobility status for transfers General Cognition: [facility] for tasks performed  Sunny Schlein, [place] [phone] 07/07/2011, 12:18 PM

## 2011-07-08 ENCOUNTER — Encounter (HOSPITAL_COMMUNITY): Payer: Self-pay | Admitting: *Deleted

## 2011-07-08 ENCOUNTER — Inpatient Hospital Stay (HOSPITAL_COMMUNITY)
Admission: RE | Admit: 2011-07-08 | Discharge: 2011-07-16 | DRG: 945 | Disposition: A | Discharge status: Home Health | Payer: Medicare Other | Source: Ambulatory Visit | Attending: Physical Medicine & Rehabilitation | Admitting: Physical Medicine & Rehabilitation

## 2011-07-08 DIAGNOSIS — E785 Hyperlipidemia, unspecified: Secondary | ICD-10-CM

## 2011-07-08 DIAGNOSIS — Z7982 Long term (current) use of aspirin: Secondary | ICD-10-CM

## 2011-07-08 DIAGNOSIS — Z8673 Personal history of transient ischemic attack (TIA), and cerebral infarction without residual deficits: Secondary | ICD-10-CM

## 2011-07-08 DIAGNOSIS — Z96659 Presence of unspecified artificial knee joint: Secondary | ICD-10-CM

## 2011-07-08 DIAGNOSIS — S32020A Wedge compression fracture of second lumbar vertebra, initial encounter for closed fracture: Secondary | ICD-10-CM

## 2011-07-08 DIAGNOSIS — R5381 Other malaise: Secondary | ICD-10-CM

## 2011-07-08 DIAGNOSIS — K5732 Diverticulitis of large intestine without perforation or abscess without bleeding: Secondary | ICD-10-CM

## 2011-07-08 DIAGNOSIS — Z95810 Presence of automatic (implantable) cardiac defibrillator: Secondary | ICD-10-CM

## 2011-07-08 DIAGNOSIS — I5022 Chronic systolic (congestive) heart failure: Secondary | ICD-10-CM

## 2011-07-08 DIAGNOSIS — M8448XA Pathological fracture, other site, initial encounter for fracture: Secondary | ICD-10-CM

## 2011-07-08 DIAGNOSIS — I359 Nonrheumatic aortic valve disorder, unspecified: Secondary | ICD-10-CM

## 2011-07-08 DIAGNOSIS — I5189 Other ill-defined heart diseases: Secondary | ICD-10-CM

## 2011-07-08 DIAGNOSIS — E039 Hypothyroidism, unspecified: Secondary | ICD-10-CM

## 2011-07-08 DIAGNOSIS — K5900 Constipation, unspecified: Secondary | ICD-10-CM

## 2011-07-08 DIAGNOSIS — M810 Age-related osteoporosis without current pathological fracture: Secondary | ICD-10-CM

## 2011-07-08 DIAGNOSIS — I4891 Unspecified atrial fibrillation: Secondary | ICD-10-CM

## 2011-07-08 DIAGNOSIS — I129 Hypertensive chronic kidney disease with stage 1 through stage 4 chronic kidney disease, or unspecified chronic kidney disease: Secondary | ICD-10-CM

## 2011-07-08 DIAGNOSIS — IMO0002 Reserved for concepts with insufficient information to code with codable children: Secondary | ICD-10-CM

## 2011-07-08 DIAGNOSIS — K219 Gastro-esophageal reflux disease without esophagitis: Secondary | ICD-10-CM

## 2011-07-08 DIAGNOSIS — Z5189 Encounter for other specified aftercare: Principal | ICD-10-CM

## 2011-07-08 DIAGNOSIS — I498 Other specified cardiac arrhythmias: Secondary | ICD-10-CM

## 2011-07-08 DIAGNOSIS — M1990 Unspecified osteoarthritis, unspecified site: Secondary | ICD-10-CM

## 2011-07-08 DIAGNOSIS — I252 Old myocardial infarction: Secondary | ICD-10-CM

## 2011-07-08 DIAGNOSIS — F341 Dysthymic disorder: Secondary | ICD-10-CM

## 2011-07-08 DIAGNOSIS — I509 Heart failure, unspecified: Secondary | ICD-10-CM

## 2011-07-08 DIAGNOSIS — R110 Nausea: Secondary | ICD-10-CM

## 2011-07-08 DIAGNOSIS — Z87891 Personal history of nicotine dependence: Secondary | ICD-10-CM

## 2011-07-08 DIAGNOSIS — S22009A Unspecified fracture of unspecified thoracic vertebra, initial encounter for closed fracture: Secondary | ICD-10-CM

## 2011-07-08 DIAGNOSIS — Z9861 Coronary angioplasty status: Secondary | ICD-10-CM

## 2011-07-08 DIAGNOSIS — I2589 Other forms of chronic ischemic heart disease: Secondary | ICD-10-CM

## 2011-07-08 DIAGNOSIS — Z79899 Other long term (current) drug therapy: Secondary | ICD-10-CM

## 2011-07-08 DIAGNOSIS — D649 Anemia, unspecified: Secondary | ICD-10-CM

## 2011-07-08 DIAGNOSIS — Z7901 Long term (current) use of anticoagulants: Secondary | ICD-10-CM

## 2011-07-08 DIAGNOSIS — I2510 Atherosclerotic heart disease of native coronary artery without angina pectoris: Secondary | ICD-10-CM

## 2011-07-08 DIAGNOSIS — T448X5A Adverse effect of centrally-acting and adrenergic-neuron-blocking agents, initial encounter: Secondary | ICD-10-CM

## 2011-07-08 DIAGNOSIS — N183 Chronic kidney disease, stage 3 unspecified: Secondary | ICD-10-CM

## 2011-07-08 LAB — PROTIME-INR
INR: 3.19 — ABNORMAL HIGH (ref 0.00–1.49)
Prothrombin Time: 33.2 seconds — ABNORMAL HIGH (ref 11.6–15.2)

## 2011-07-08 MED ORDER — LORATADINE 10 MG PO TABS
10.0000 mg | ORAL_TABLET | Freq: Every day | ORAL | Status: DC
  Administered 2011-07-09 – 2011-07-16 (×8): 10 mg via ORAL
  Filled 2011-07-08 (×10): qty 1

## 2011-07-08 MED ORDER — ASPIRIN EC 81 MG PO TBEC
81.0000 mg | DELAYED_RELEASE_TABLET | Freq: Every day | ORAL | Status: DC
  Administered 2011-07-09 – 2011-07-16 (×8): 81 mg via ORAL
  Filled 2011-07-08 (×9): qty 1

## 2011-07-08 MED ORDER — ONDANSETRON HCL 4 MG/2ML IJ SOLN: 4.0000 mg | Freq: Four times a day (QID) | INTRAMUSCULAR | Status: DC | PRN

## 2011-07-08 MED ORDER — POTASSIUM CHLORIDE CRYS ER 20 MEQ PO TBCR
20.0000 meq | EXTENDED_RELEASE_TABLET | Freq: Every day | ORAL | Status: DC
  Administered 2011-07-09 – 2011-07-16 (×8): 20 meq via ORAL
  Filled 2011-07-08 (×10): qty 1

## 2011-07-08 MED ORDER — FLUTICASONE PROPIONATE 50 MCG/ACT NA SUSP: 2.0000 | Freq: Every day | NASAL | Status: DC | PRN

## 2011-07-08 MED ORDER — FA-PYRIDOXINE-CYANOCOBALAMIN 2.5-25-2 MG PO TABS
1.0000 | ORAL_TABLET | Freq: Every day | ORAL | Status: DC
  Administered 2011-07-09 – 2011-07-16 (×8): 1 via ORAL
  Filled 2011-07-08 (×10): qty 1

## 2011-07-08 MED ORDER — OMEGA-3-ACID ETHYL ESTERS 1 G PO CAPS
1000.0000 mg | ORAL_CAPSULE | Freq: Every day | ORAL | Status: DC
  Administered 2011-07-09 – 2011-07-16 (×8): 1000 mg via ORAL
  Filled 2011-07-08 (×10): qty 1

## 2011-07-08 MED ORDER — AMIODARONE HCL 200 MG PO TABS
200.0000 mg | ORAL_TABLET | Freq: Every day | ORAL | Status: DC
  Administered 2011-07-09 – 2011-07-16 (×7): 200 mg via ORAL
  Filled 2011-07-08 (×9): qty 1

## 2011-07-08 MED ORDER — WARFARIN - PHARMACIST DOSING INPATIENT: Freq: Every day | Status: DC

## 2011-07-08 MED ORDER — CALCIUM CARBONATE-VITAMIN D 500-200 MG-UNIT PO TABS
1.0000 | ORAL_TABLET | Freq: Two times a day (BID) | ORAL | Status: DC
  Administered 2011-07-08 – 2011-07-16 (×16): 1 via ORAL
  Filled 2011-07-08 (×20): qty 1

## 2011-07-08 MED ORDER — FUROSEMIDE 20 MG PO TABS
20.0000 mg | ORAL_TABLET | Freq: Every day | ORAL | Status: DC
  Administered 2011-07-09 – 2011-07-16 (×8): 20 mg via ORAL
  Filled 2011-07-08 (×9): qty 1

## 2011-07-08 MED ORDER — ACETAMINOPHEN 325 MG PO TABS
325.0000 mg | ORAL_TABLET | ORAL | Status: DC | PRN
  Administered 2011-07-08: 650 mg via ORAL
  Filled 2011-07-08: qty 2

## 2011-07-08 MED ORDER — BUSPIRONE HCL 15 MG PO TABS
15.0000 mg | ORAL_TABLET | Freq: Every day | ORAL | Status: DC | PRN
  Filled 2011-07-08: qty 1

## 2011-07-08 MED ORDER — LISINOPRIL 2.5 MG PO TABS
2.5000 mg | ORAL_TABLET | Freq: Two times a day (BID) | ORAL | Status: DC
  Administered 2011-07-08 – 2011-07-12 (×6): 2.5 mg via ORAL
  Filled 2011-07-08 (×12): qty 1

## 2011-07-08 MED ORDER — METHOCARBAMOL 500 MG PO TABS
500.0000 mg | ORAL_TABLET | Freq: Four times a day (QID) | ORAL | Status: DC | PRN
  Administered 2011-07-08 – 2011-07-10 (×6): 500 mg via ORAL
  Filled 2011-07-08 (×6): qty 1

## 2011-07-08 MED ORDER — FENTANYL 12 MCG/HR TD PT72
12.5000 ug | MEDICATED_PATCH | TRANSDERMAL | Status: DC
  Administered 2011-07-10 – 2011-07-13 (×2): 12.5 ug via TRANSDERMAL
  Filled 2011-07-08 (×2): qty 1

## 2011-07-08 MED ORDER — CARVEDILOL 12.5 MG PO TABS
12.5000 mg | ORAL_TABLET | Freq: Two times a day (BID) | ORAL | Status: DC
  Administered 2011-07-08 – 2011-07-09 (×3): 12.5 mg via ORAL
  Filled 2011-07-08 (×6): qty 1

## 2011-07-08 MED ORDER — SODIUM CHLORIDE 0.9 % IV BOLUS (SEPSIS)
500.0000 mL | Freq: Once | INTRAVENOUS | Status: AC
  Administered 2011-07-08: 500 mL via INTRAVENOUS

## 2011-07-08 MED ORDER — RALOXIFENE HCL 60 MG PO TABS
60.0000 mg | ORAL_TABLET | Freq: Every day | ORAL | Status: DC
  Administered 2011-07-09 – 2011-07-16 (×8): 60 mg via ORAL
  Filled 2011-07-08 (×10): qty 1

## 2011-07-08 MED ORDER — ONDANSETRON HCL 4 MG PO TABS
4.0000 mg | ORAL_TABLET | Freq: Four times a day (QID) | ORAL | Status: DC | PRN
  Administered 2011-07-09 – 2011-07-15 (×3): 4 mg via ORAL
  Filled 2011-07-08 (×3): qty 1

## 2011-07-08 MED ORDER — EZETIMIBE 10 MG PO TABS
10.0000 mg | ORAL_TABLET | Freq: Every day | ORAL | Status: DC
  Administered 2011-07-09 – 2011-07-16 (×8): 10 mg via ORAL
  Filled 2011-07-08 (×10): qty 1

## 2011-07-08 MED ORDER — PANTOPRAZOLE SODIUM 40 MG PO TBEC
40.0000 mg | DELAYED_RELEASE_TABLET | Freq: Two times a day (BID) | ORAL | Status: DC
  Administered 2011-07-08 – 2011-07-16 (×16): 40 mg via ORAL
  Filled 2011-07-08 (×20): qty 1

## 2011-07-08 MED ORDER — SENNA 8.6 MG PO TABS
2.0000 | ORAL_TABLET | Freq: Every day | ORAL | Status: DC
  Administered 2011-07-08 – 2011-07-11 (×3): 17.2 mg via ORAL
  Filled 2011-07-08 (×9): qty 2

## 2011-07-08 MED ORDER — NITROGLYCERIN 0.4 MG SL SUBL: 0.4000 mg | SUBLINGUAL_TABLET | SUBLINGUAL | Status: DC | PRN

## 2011-07-08 MED ORDER — SORBITOL 70 % SOLN: 30.0000 mL | Freq: Every day | Status: DC | PRN

## 2011-07-08 MED ORDER — HYDROCODONE-ACETAMINOPHEN 5-325 MG PO TABS
2.0000 | ORAL_TABLET | ORAL | Status: DC | PRN
  Administered 2011-07-08 – 2011-07-14 (×20): 2 via ORAL
  Filled 2011-07-08 (×20): qty 2

## 2011-07-08 MED ORDER — LEVOTHYROXINE SODIUM 150 MCG PO TABS
150.0000 ug | ORAL_TABLET | Freq: Every day | ORAL | Status: DC
  Administered 2011-07-09 – 2011-07-10 (×2): 150 ug via ORAL
  Filled 2011-07-08 (×3): qty 1

## 2011-07-08 MED ORDER — POLYETHYLENE GLYCOL 3350 17 G PO PACK
17.0000 g | PACK | Freq: Every day | ORAL | Status: DC
  Administered 2011-07-12 – 2011-07-16 (×3): 17 g via ORAL
  Filled 2011-07-08 (×9): qty 1

## 2011-07-08 NOTE — Discharge Summary (Signed)
Discharge Summary  [name] MR#: [id number]  DOB:1951/02/28  Date of Admission: 07/05/2011 Date of Discharge: 07/08/2011  Patient's PCP: Sanda Linger, MD, MD  Attending Physician:Aune Adami  Consults: Treatment Team:  #1: Neurosurgery:James Mickeal Skinner, MD   Discharge Diagnoses: L2 vertebral fracture Present on Admission:  .Back pain .L2 vertebral fracture .HTN (hypertension) .PAF (paroxysmal atrial fibrillation) .Dyslipidemia .MYOCARDIAL INFARCTION, HX OF .Chronic systolic heart failure .CKD (chronic kidney disease) .implantable cardiac defibrillator- Engineering geologist .CAD (coronary artery disease) .HYPERTENSION .HYPERLIPIDEMIA .HYPOTHYROIDISM   Brief Admitting History and Physical This is a 61 year old female, with known history of CAD, s/p AMI 10/2001, s/p Staged PCI-Cypher DES to 1-OM 10/2001, then Cypher DES to LAD 09/2002, ischemic CMP; EF 25%-30%, s/p [employer] ICD 2002, PAF, intramural LV thrombus, on chronic anticoagulation, TIA 2004, HTN, hypothyroidism, dyslipidemia, GERD, history of lower GI bleed, diverticulosis, s/p partial colectomy 03/2010/colostomy take-down 10/2010 , OA/spinal stenosis, s/p surgery with placement of hardware L3-4-L5-S1, in December 2012 by Dr. Phoebe Perch, CKD-3 (GFR 44-51; Baseline creatinine of 1.0-1.29 12/2010), presenting with above symptoms. Patient is an excellent historian, and according to her, she was feeling quite fine all day on 07/04/11, then at about 9:00PM, while at home, she bent over and picked up a heavy box, then felt a "pop" in her lower back, followed by excruciating pain, and could not straighten up. She somehow managed to make it into the bed, but couldn't get comfortable, and could not even change position, because of pain. She took some muscle relaxants to no avail, and called her brother by phone. He came over, and confirms that she was unable to sleep all night. In the morning of 07/05/11, she call Dr  Doreen Beam office, and was advised to proceed to ED. She called EMS.  For the rest of the admission history and physical please see H&P dictated by Dr. Brien Few    Discharge Medications Medication List  As of 07/08/2011 11:51 AM   CONTINUE taking these medications         amiodarone 200 MG tablet   Commonly known as: PACERONE      aspirin 81 MG EC tablet      busPIRone 15 MG tablet   Commonly known as: BUSPAR      carvedilol 12.5 MG tablet   Commonly known as: COREG      cyclobenzaprine 5 MG tablet   Commonly known as: FLEXERIL      ezetimibe 10 MG tablet   Commonly known as: ZETIA      fexofenadine 180 MG tablet   Commonly known as: ALLEGRA      fish oil-omega-3 fatty acids 1000 MG capsule      fluticasone 50 MCG/ACT nasal spray   Commonly known as: FLONASE      folic acid-pyridoxine-cyancobalamin 2.5-25-2 MG Tabs   Commonly known as: FOLTX      HYDROcodone-acetaminophen 5-325 MG per tablet   Commonly known as: NORCO      levothyroxine 150 MCG tablet   Commonly known as: SYNTHROID, LEVOTHROID      * methocarbamol 500 MG tablet   Commonly known as: ROBAXIN      * methocarbamol 500 MG tablet   Commonly known as: ROBAXIN      nitroGLYCERIN 0.4 MG SL tablet   Commonly known as: NITROSTAT      oxyCODONE-acetaminophen 5-325 MG per tablet   Commonly known as: PERCOCET      pantoprazole 40 MG tablet   Commonly known as: PROTONIX  potassium chloride SA 20 MEQ tablet   Commonly known as: K-DUR,KLOR-CON      warfarin 3 MG tablet   Commonly known as: COUMADIN     * Notice: This list has 2 medication(s) that are the same as other medications prescribed for you. Read the directions carefully, and ask your doctor or other care provider to review them with you.       STOP taking these medications         furosemide 20 MG tablet      lisinopril 2.5 MG tablet           Hospital Course: L2 vertebral fracture Patient was admitted with the L2 vertebral fracture  which was felt to be the etiology of patient's pain. Patient was placed on a pain management and Duragesic patch was added to her regimen. A neurosurgical consultation was obtained and patient was seen in consultation by Dr. Phoebe Perch on 07/06/2011. The brace was recommended when patient was out of bed and patient was placed in a brace. And pain management was recommended. Was recommended that the patient to followup with Dr. Phoebe Perch as outpatient with serial x-rays to evaluate her fracture. Patient remained in stable condition during the hospitalization and Duragesic patch was applied as well as a Percocet for breakthrough pain. Patient was evaluated by physical therapy and inpatient rehabilitation was one of their recommendations. An inpatient rehabilitation consultation was obtained patient was seen by Dr. Elgie Congo and was felt that patient was a good candidate for inpatient rehabilitation. Patient was subsequently be discharged to inpatient rehabilitation in stable and improved condition.  #2 hypertension During the hospitalization patient did have some borderline systolic blood pressure in the 90s. However patient was minimally symptomatic. Will recommend holding patient's lisinopril and her Lasix for now. Patient may continue on her Coreg and hold for systolic blood pressure less than 95. Patient is currently in stable condition. Once patient's blood pressure improved may resume her antihypertensive/cardiac meds slowly. She'll be discharged in stable condition.  The rest of patient's chronic medical issues remained stable throughout the hospitalization patient be discharged in stable and improved condition.  Present on Admission:  .Back pain .L2 vertebral fracture .HTN (hypertension) .PAF (paroxysmal atrial fibrillation) .Dyslipidemia .MYOCARDIAL INFARCTION, HX OF .Chronic systolic heart failure .CKD (chronic kidney disease) .implantable cardiac defibrillator- Estate agent .CAD (coronary artery disease) .HYPERTENSION .HYPERLIPIDEMIA .HYPOTHYROIDISM   Day of Discharge BP 90/59  Pulse 52  Temp(Src) 98.3 F (36.8 C) (Oral)  Resp 20  Ht 5\' 4"  (1.626 m)  Wt 74.7 kg (164 lb 10.9 oz)  BMI 28.27 kg/m2  SpO2 98% Subjective: No complaints. Patient states had minimal dizziness on sitting up however as subsequently resolved. Patient states that the pain is slowly being controlled. Patient sitting up in chair with a brace on. General: Alert, awake, oriented x3, in no acute distress. Brace on HEENT: No bruits, no goiter. Heart: Regular rate and rhythm, without murmurs, rubs, gallops. Lungs: Clear to auscultation bilaterally, anterior lung fields. Abdomen: Soft, nontender, nondistended, positive bowel sounds. Extremities: No clubbing cyanosis or edema with positive pedal pulses. Neuro: Grossly intact, nonfocal.   Results for orders placed during the hospital encounter of 07/05/11 (from the past 48 hour(s))  PROTIME-INR     Status: Abnormal   Collection Time   07/07/11  5:00 AM      Component Value Range Comment   Prothrombin Time 31.2 (*) 11.6 - 15.2 (seconds)    INR 2.95 (*) 0.00 - 1.49  PROTIME-INR     Status: Abnormal   Collection Time   07/08/11  5:35 AM      Component Value Range Comment   Prothrombin Time 33.2 (*) 11.6 - 15.2 (seconds)    INR 3.19 (*) 0.00 - 1.49      Dg Lumbar Spine Complete  07/05/2011  *RADIOLOGY REPORT*  Clinical Data: Low back pain after bending over.  Prior back surgeries, most recently December, 2012.  LUMBAR SPINE - COMPLETE 4+ VIEW  Comparison: 01/26/2011  Findings: Posterolateral rod pedicle screw fixation noted at L3-L4 - L5-S1. No hardware fracture is observed.  There may be minimal lucency around the right S1 screw.  Posterior decompression noted in the lumbar spine.  Bony demineralization is present.  Overall the degree of mild anterior subluxation of L5 on S1 is stable.  Compared to exam from  01/26/2011, there is a 15% superior endplate compression fracture at L2.  IMPRESSION:  1.  New 15% superior endplate compression fracture at L2. 2.  Otherwise unchanged appearance of the lumbar spine. 3. Per CMS PQRS reporting requirements (PQRS Measure 24): Given the patient's age of greater than 50 and the fracture site (hip, distal radius, or spine), the patient should be tested for osteoporosis using DXA, and the appropriate treatment considered based on the DXA results.  Original Report Authenticated By: Dellia Cloud, M.D.     Disposition: Inpatient rehabilitation  Diet: Low sodium diet  Activity: Per inpatient rehabilitation   Follow-up Appts: Discharge Orders    Future Appointments: Provider: Department: Dept Phone: Center:   07/11/2011 1:30 PM [facility] [phone] None   09/19/2011 2:30 PM Rosalio Macadamia, NP [facility] [phone] None      TESTS THAT NEED FOLLOW-UP   Time spent on discharge, talking to the patient, and coordinating care: 50 mins.   SignedRamiro Harvest 07/08/2011, 11:52 AM

## 2011-07-08 NOTE — Plan of Care (Addendum)
Overall Plan of Care [facility]) Patient Details Name: [name] MRN: [id number] DOB: June 23, 1950  Diagnosis:  Rehabilitation for acute lumbar compression fracture and deconditioning  Primary Diagnosis:    Compression fracture of L2 Co-morbidities: Spinal stenosis  .  Numbness of foot  left foot  .  Diverticulitis  Status post partial colectomy 1/12 with reversal in July 2012  .  Colitis, ischemic  .  DJD (degenerative joint disease)  History of multiple surgeries to the back, shoulder and knee  .  Ischemic cardiomyopathy  Echo 06/16/10: EF 25-30%, anteroseptal and apical hypokinesis, moderate AI, mild MR  .  Chronic systolic heart failure  .  CAD (coronary artery disease)  a. Ant MI 8/03 with stenting of the LAD; b. staged PCI with Cypher DES to OM1 8/03; c. s/p Cypher DES to LAD 7/04; d. multiple cardiac caths in past (chronically abnl ECG); e. cardiac catheterization 3/12: LAD stent patent, distal LAD 40-50%, small ostial D1 80%, ostial D2 60%, OM-1 stent patent (20-30%), proximal RCA 50%, proximal to mid RCA 40-50%; f. cath 02/17/2011 - nonobs  .  LV (left ventricular) mural thrombus  Chronic Coumadin therapy  .  CKD (chronic kidney disease), stage III  creatinine: 1.3 in 4/12;    Functional Problem List  Patient demonstrates impairments in the following areas: Balance, Endurance, Motor, Pain and Safety  Basic ADL's: grooming, bathing, dressing, toileting and shower, simple meal prep Advanced ADL's: simple meal preparation  Transfers:  bed mobility, bed to chair, toilet, tub/shower, car and furniture Locomotion:  ambulation  Additional Impairments:  None  Anticipated Outcomes Item Anticipated Outcome  Eating/Swallowing    Basic self-care  Mod I  Tolieting  Mod i  Bowel/Bladder   no issues  Transfers  Mod-I  Locomotion  Mod-I  Communication    Cognition    Pain  Maintain pain <3  Safety/Judgment  N/A  Other     Therapy Plan: PT Frequency: 1-2  X/day, 60-90 minutes OT Frequency: 1-2 X/day, 60-90 minutes     Team Interventions: Item RN PT OT SLP SW TR Other  Self Care/Advanced ADL Retraining   x      Neuromuscular Re-Education         Therapeutic Activities  x x   x   UE/LE Strength Training/ROM  x x      UE/LE Coordination Activities         Visual/Perceptual Remediation/Compensation         DME/Adaptive Equipment Instruction  x x   x   Therapeutic Exercise  x x   x   Balance/Vestibular Training  x x   x   Patient/Family Education x x x   x   Cognitive Remediation/Compensation         Functional Mobility Training  x x   x   Ambulation/Gait Training  x       Furniture conservator/restorer Reintegration      x   Dysphagia/Aspiration Landscape architect Facilitation         Bladder Management         Bowel Management         Disease Management/Prevention         Pain Management x x       Medication Management  x        Skin Care/Wound Management         Splinting/Orthotics         Discharge Planning     x x   Psychosocial Support     x x                      Team Discharge Planning: Destination:  Home Projected Follow-up:  OTPT Projected Equipment Needs:  None Patient/family involved in discharge planning:  Yes  MD ELOS: 10-14 days Medical Rehab Prognosis:  Good Assessment: 61 year-old female with history of multiple back surgeries as well as multiple medical issues listed above who bent to pick up an object and sustained an L2 compression fracture. She now requires 24 7 rehabilitation RN and M.D. as well as CIR level PT and OT.

## 2011-07-08 NOTE — Progress Notes (Signed)
I met with patient at bedside. She will benefit from an inpatient rehab stay prior to d/c home. Bed is available today. Dr. Janee Morn is aware.Please call [phone] with questions.

## 2011-07-08 NOTE — Evaluation (Signed)
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 HYPOTHYROIDISM  . HYPERLIPIDEMIA  . HYPERTENSION  . MYOCARDIAL INFARCTION, HX OF  . Chronic systolic heart failure  . OSTEOARTHRITIS  . Spinal stenosis  . Numbness of foot  . Ischemia, bowel  . Ischemic cardiomyopathy  . Chronic anticoagulation  . Stroke  . Arthritis  . CKD (chronic kidney disease)  . Tobacco abuse  . GERD (gastroesophageal reflux disease)  . LV (left ventricular) mural thrombus  . [employer] AICD  . Atrial fibrillation  . implantable cardiac defibrillator- Engineering geologist  . Anemia  . Unstable angina  . CAD (coronary artery disease)  . Unspecified disorder of urethra and urinary tract  . Carotid stenosis  . L2 vertebral fracture  . HTN (hypertension)  . PAF (paroxysmal atrial fibrillation)  . Dyslipidemia  . Compression fracture of L2    Past Medical History:  Past Medical History  Diagnosis Date  . Spinal stenosis   . Numbness of foot     left foot  . Diverticulitis     Status post partial colectomy 1/12 with reversal in July 2012  . Colitis, ischemic   . DJD (degenerative joint disease)     History of multiple surgeries to the back, shoulder and knee  . Ischemic cardiomyopathy     Echo 06/16/10: EF 25-30%, anteroseptal and apical hypokinesis, moderate AI, mild MR  . Chronic systolic heart failure   . CAD (coronary artery disease)     a.  Ant MI 8/03 with stenting of the LAD;  b. staged PCI with Cypher DES to OM1 8/03;  c. s/p Cypher DES to LAD 7/04;  d.  multiple cardiac caths in past (chronically abnl ECG);    e. cardiac catheterization 3/12: LAD stent  patent, distal LAD 40-50%, small ostial D1 80%, ostial D2 60%, OM-1 stent patent (20-30%), proximal RCA 50%, proximal to mid RCA 40-50%; f. cath 02/17/2011 - nonobs  . LV (left ventricular) mural thrombus     Chronic Coumadin therapy  . CKD (chronic kidney disease), stage III     creatinine:  1.3 in 4/12;    . Tobacco abuse     history  . GERD (gastroesophageal reflux disease)   . Hypertension   . Lower GI bleed     August 2011  . Hypothyroidism   . Aortic insufficiency   . Pseudoaneurysm     History of, right forearm  . Hyperlipidemia   . OA (osteoarthritis)   . Abnormal EKG     Chronically abnormal EKG.   . Spinal stenosis   . Numbness of foot     Left foot  . Compression fracture 07/04/11    L2  . Anterior myocardial infarction 10/2001  . CHF (congestive heart failure)   . SVT (supraventricular tachycardia)     ? h/o AFib;  patient on long term amiodarone therapy  . PAF (paroxysmal atrial fibrillation)   . Anatomical narrow angle of right eye   . Heart murmur     "age 18-19"  . Shortness of breath     "at times; related to CHF"  . Shortness of breath on exertion   . ICD (implantable cardiac  defibrillator) in place 07/2003    followed by Dr. Graciela Husbands.   [name] AICD (automatic cardioverter/defibrillator) present 10/2004    explant; implant  . Blood transfusion   . Anemia   . H/O hiatal hernia   . Stroke     History of TIA and possible CVA; hospitalized 2004; on coumadin   Past Surgical History:  Past Surgical History  Procedure Date  . Back surgery   . Coronary angioplasty with stent placement 10/2001; 09/2002  . Cardiac defibrillator placement 07/2003; 10/2004  . Lumbar laminectomy/decompression microdiscectomy 10/2001    L5-S1/E-chart  . Knee arthroscopy     left  . Thyroidectomy 1970's  . Total knee arthroplasty 11/2003    left  . X-stop implantation 12/2004    L3-4; L4-5  . Fixation kyphoplasty thoracic spine 07/2007    T3, 4, 6 compression fractures  . Shoulder open  rotator cuff repair 04/2008    right  . Lumbar laminectomy/decompression microdiscectomy 01/2010  . Colectomy 03/2010    sigmoid left  . Colostomy 03/2010    transverse  . Peripherally inserted central catheter insertion 03/2010    removed upon discharge  . Colostomy closure 12/2010    reversal  . Tonsillectomy and adenoidectomy 1969  . Appendectomy 03/2010  . Cataract extraction     right    Assessment & Plan Clinical Impression: Patient is a 61 y.o. year old female with recent admission to the hospital on April 16 with back pain and decreased mobility. Patient transferred to CIR on 07/08/2011 .    Patient currently requires mod with basic self-care skills secondary to muscle weakness and muscle paralysis.  Prior to hospitalization, patient could complete BADL with moderate independence.  Patient will benefit from skilled intervention to increase level of independence with iADL prior to discharge home independently.  Anticipate patient will require intermittent supervision and follow up home health.  OT - End of Session Activity Tolerance: Tolerates 10 - 20 min activity with multiple rests Endurance Deficit: No OT Assessment Rehab Potential: Good Barriers to Discharge: None OT Plan OT Frequency: 1-2 X/day, 60-90 minutes Estimated Length of Stay: 7-10 days OT Treatment/Interventions: Balance/vestibular training;DME/adaptive equipment instruction;Functional mobility training;Pain management;Patient/family education;Self Care/advanced ADL retraining;Therapeutic Activities;Therapeutic Exercise;UE/LE Strength taining/ROM;Wheelchair propulsion/positioning  OT Evaluation Precautions/Restrictions  Precautions Precautions: Back Precaution Comments: Reviewed back precautions Required Braces or Orthoses: Spinal Brace Spinal Brace: Thoracolumbosacral orthotic;Applied in sitting position Restrictions Weight Bearing Restrictions: No General Chart Reviewed: Yes Response to Previous  Treatment: Not applicable Family/Caregiver Present: No Vital Signs Therapy Vitals Temp: 97.6 F (36.4 C) Temp src: Oral Pulse Rate:  (hx of Bradycardia) Resp: 20  BP: 101/63 mmHg Patient Position, if appropriate: Lying Oxygen Therapy SpO2: 98 % Pain Pain Assessment Pain Score: 0-No pain Faces Pain Scale: No hurt Pain Type: Neuropathic pain;Acute pain (muscle spasms) Pain Location: Back Pain Orientation: Lower Pain Descriptors: Aching;Dull;Shooting Pain Onset: On-going Patients Stated Pain Goal: 0 Pain Intervention(s): Medication (See eMAR) Multiple Pain Sites: No PAINAD (Pain Assessment in Advanced Dementia) Breathing: normal Critical Care Pain Observation Tool (CPOT) Facial Expression: Relaxed, neutral Home Living/Prior Functioning Home Living Lives With: Alone Available Help at Discharge: Family Type of Home: House Home Access: Stairs to enter Secretary/administrator of Steps: 2 Entrance Stairs-Rails: Left Home Layout: One level Bathroom Shower/Tub: Forensic scientist: Handicapped height Bathroom Accessibility: Yes How Accessible: Accessible via walker Home Adaptive Equipment: Bedside commode/3-in-1;Shower chair with back;Walker - rolling;Reacher IADL History Homemaking Responsibilities: Yes Meal Prep Responsibility: Primary Laundry Responsibility: No Cleaning Responsibility:  No Bill Paying/Finance Responsibility: Primary Shopping Responsibility: Primary Current License: Yes Mode of Transportation: Car Occupation: Retired Leisure and Hobbies:  (tv, sudoco, read) Prior Function Level of Independence: Independent with homemaking with ambulation;Independent with transfers Able to Take Stairs?: Yes Driving: Yes Vocation: Retired ADL   Vision/Perception:  Glasses, 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     Cognition Overall Cognitive Status: Appears within functional limits for tasks assessed Arousal/Alertness: Awake/alert Orientation Level: Oriented  X4 Attention: Alternating;Selective Alternating Attention: Appears intact Memory: Appears intact Sensation Sensation Light Touch: Impaired Detail (UE intact; impaired on left foot to light touch) Light Touch Impaired Details: Impaired LLE Proprioception: Impaired by gross assessment Additional Comments: left foot impaired Coordination Gross Motor Movements are Fluid and Coordinated: Yes Fine Motor Movements are Fluid and Coordinated: Yes Motor  Motor Motor: Other (comment) Mobility  Bed Mobility Bed Mobility: Rolling Right;Right Sidelying to Sit;Sit to Supine;Supine to Sit Rolling Right: 4: Min assist Left Sidelying to Sit: 4: Min assist Supine to Sit: 4: Min assist Sit to Supine: 3: Mod assist Transfers Sit to Stand: 3: Mod assist;From bed;With upper extremity assist;Other (comment) Sit to Stand Details: Tactile cues for placement  Trunk/Postural Assessment wfl    Balance impaired   Extremity/Trunk Assessment RUE Assessment RUE Assessment: Within Functional Limits RUE PROM (degrees) Overall PROM Right Upper Extremity: Within functional limits for tasks performed LUE Assessment LUE Assessment: Exceptions to WFL LUE AROM (degrees) Overall AROM Left Upper Extremity: Deficits;Due to pain;Due to premorbid status (LUE rotator cuff tear)  See FIM for current functional status Refer to Care Plan for Long Term Goals  Recommendations for other services: None  Discharge Criteria: Patient will be discharged from OT if patient refuses treatment 3 consecutive times without medical reason, if treatment goals not met, if there is a change in medical status, if patient makes no progress towards goals or if patient is discharged from hospital.  The above assessment, treatment plan, treatment alternatives and goals were discussed and mutually agreed upon: by patient  Humberto Seals 07/08/2011, 6:28 PM

## 2011-07-08 NOTE — Progress Notes (Signed)
ANTICOAGULATION CONSULT NOTE - Follow Up Consult  Pharmacy Consult for : Coumadin Indication: Atrial fibrillation  Allergies  Allergen Reactions  . Penicillins Swelling    Throat swells  . Prevacid Nausea Only  . Statins Other (See Comments)    cramps    Patient Measurements: Height: 5\' 4"  (162.6 cm) Weight: 164 lb 10.9 oz (74.7 kg) IBW/kg (Calculated) : 54.7    Vital Signs: Temp: 98.3 F (36.8 C) (04/19 1000) Temp src: Oral (04/19 1000) BP: 90/59 mmHg (04/19 1000) Pulse Rate: 52  (04/19 1000)  Labs:  Basename 07/08/11 0535 07/07/11 0500 07/06/11 0605 07/05/11 1601  HGB -- -- 11.8* 12.9  HCT -- -- 35.4* 37.7  PLT -- -- 146* 131*  APTT -- -- -- --  LABPROT 33.2* 31.2* 24.2* --  INR 3.19* 2.95* 2.13* --  HEPARINUNFRC -- -- -- --  CREATININE -- -- 1.57* 1.09  CKTOTAL -- -- -- --  CKMB -- -- -- --  TROPONINI -- -- -- --   Estimated Creatinine Clearance: 37.2 ml/min (by C-G formula based on Cr of 1.57).   Medications:     acetaminophen 500 mg Oral TID  amiodarone 200 mg Oral Daily  aspirin EC 81 mg Oral Daily  calcium-vitamin D 1 tablet Oral BID WC  carvedilol 12.5 mg Oral BID WC  docusate sodium 100 mg Oral BID  ezetimibe 10 mg Oral Daily  fentaNYL 12.5 mcg Transdermal Q72H  folic acid-pyridoxine-cyancobalamin 1 tablet Oral Daily  furosemide 20 mg Oral Daily  levothyroxine 150 mcg Oral Q0600  lisinopril 2.5 mg Oral BID  loratadine 10 mg Oral Daily  methocarbamol 500 mg Oral QID  omega-3 acid ethyl esters 1,000 mg Oral Q breakfast  oxyCODONE 5 mg Oral QID  pantoprazole 40 mg Oral BID  polyethylene glycol 17 g Oral Daily  potassium chloride SA 20 mEq Oral Daily  raloxifene 60 mg Oral Daily  senna 2 tablet Oral QHS  sodium chloride 3 mL Intravenous Q12H  sodium chloride 3 mL Intravenous Q12H  warfarin 1 mg Oral ONCE-1800  Warfarin - Pharmacist Dosing Inpatient  Does not apply q1800  DISCONTD: warfarin 3 mg Oral q1800    Assessment:  Patient is a  61 y/o female on chronic Coumadin for history of A-fib.  INR slightly > than therapeutic goal despite dose reduction from home schedule.  No reported bleeding complications.  Patient continues on Amiodarone (potentiator of Coumadin's effect on INR).  Goal of Therapy:   INR 2-3   Plan:   No Coumadin today.  [place], Elisha Headland, Pharm.D. 07/08/2011 11:10 AM

## 2011-07-08 NOTE — Progress Notes (Signed)
Pt admitted to 4140 @ 1500 via bed from 6710.  AOX3, VSS, patient oriented to unit.  Bed Alarm set and call bell in reach.

## 2011-07-08 NOTE — H&P (Signed)
Physical Medicine and Rehabilitation Admission H&P    No chief complaint on file. : HPI: 61 year old right-handed African American female well-known rehabilitation services with history of multiple back surgeries. Last rehabilitation admission was in December of 2011 Admitted April 16 when she noted severe low back pain when bending over limiting her overall mobility. X-ray and imaging revealed mild lumbar L2 compression fracture. She was seen by neurosurgery advised conservative care. She is placed in a TLSO brace that she had from previous surgeries. Pain management ongoing with low-dose Duragesic patch added today. Patient remains on chronic Coumadin therapy for history left ventricular mural thrombus. Physical and occupational therapy evaluation completed recommendation physical medicine rehabilitation consult to consider inpatient rehabilitation services Her other rehabilitation admissions were for left lower extremity radiculopathy related to spinal stenosis Review of Systems  Cardiovascular: Positive for palpitations.  Gastrointestinal: Positive for constipation.  Musculoskeletal: Positive for myalgias, back pain and joint pain.  All other systems reviewed and are negative   Past Medical History  Diagnosis Date  . Spinal stenosis   . Numbness of foot     left foot  . Diverticulitis     Status post partial colectomy 1/12 with reversal in July 2012  . Colitis, ischemic   . DJD (degenerative joint disease)     History of multiple surgeries to the back, shoulder and knee  . Ischemic cardiomyopathy     Echo 06/16/10: EF 25-30%, anteroseptal and apical hypokinesis, moderate AI, mild MR  . Chronic systolic heart failure   . CAD (coronary artery disease)     a.  Ant MI 8/03 with stenting of the LAD;  b. staged PCI with Cypher DES to OM1 8/03;  c. s/p Cypher DES to LAD 7/04;  d.  multiple cardiac caths in past (chronically abnl ECG);    e. cardiac catheterization 3/12: LAD stent patent,  distal LAD 40-50%, small ostial D1 80%, ostial D2 60%, OM-1 stent patent (20-30%), proximal RCA 50%, proximal to mid RCA 40-50%; f. cath 02/17/2011 - nonobs  . LV (left ventricular) mural thrombus     Chronic Coumadin therapy  . CKD (chronic kidney disease), stage III     creatinine:  1.3 in 4/12;    . Tobacco abuse     history  . GERD (gastroesophageal reflux disease)   . Hypertension   . Lower GI bleed     August 2011  . Hypothyroidism   . Aortic insufficiency   . Pseudoaneurysm     History of, right forearm  . Hyperlipidemia   . OA (osteoarthritis)   . Abnormal EKG     Chronically abnormal EKG.   . Spinal stenosis   . Numbness of foot     Left foot  . Compression fracture 07/04/11    L2  . Anterior myocardial infarction 10/2001  . CHF (congestive heart failure)   . SVT (supraventricular tachycardia)     ? h/o AFib;  patient on long term amiodarone therapy  . PAF (paroxysmal atrial fibrillation)   . Anatomical narrow angle of right eye   . Heart murmur     "age 63-19"  . Shortness of breath     "at times; related to CHF"  . Shortness of breath on exertion   . 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 Graciela Husbands.   [name] AICD (automatic cardioverter/defibrillator) present 10/2004    explant; implant  . Blood transfusion   . Anemia   . H/O hiatal hernia   .  Stroke     History of TIA and possible CVA; hospitalized 2004; on coumadin   Past Surgical History  Procedure Date  . Back surgery   . Coronary angioplasty with stent placement 10/2001; 09/2002  . Cardiac defibrillator placement 07/2003; 10/2004  . Lumbar laminectomy/decompression microdiscectomy 10/2001    L5-S1/E-chart  . Knee arthroscopy     left  . Thyroidectomy 1970's  . Total knee arthroplasty 11/2003    left  . X-stop implantation 12/2004    L3-4; L4-5  . Fixation kyphoplasty thoracic spine 07/2007    T3, 4, 6 compression fractures  . Shoulder open rotator cuff repair 04/2008     right  . Lumbar laminectomy/decompression microdiscectomy 01/2010  . Colectomy 03/2010    sigmoid left  . Colostomy 03/2010    transverse  . Peripherally inserted central catheter insertion 03/2010    removed upon discharge  . Colostomy closure 12/2010    reversal  . Tonsillectomy and adenoidectomy 1969  . Appendectomy 03/2010  . Cataract extraction     right   Family History  Problem Relation Age of Onset  . Diabetes Mother   . Diabetes Brother   . Arthritis      family history  . Prostate cancer      Family History   Social History:  reports that she quit smoking about 10 years ago. Her smoking use included Cigarettes. She has a 3.3 pack-year smoking history. She has never used smokeless tobacco. She reports that she drinks alcohol. She reports that she does not use illicit drugs. Allergies:  Allergies  Allergen Reactions  . Penicillins Swelling    Throat swells  . Prevacid Nausea Only  . Statins Other (See Comments)    cramps   Medications Prior to Admission  Medication Dose Route Frequency Provider Last Rate Last Dose  . acetaminophen (TYLENOL) tablet 325-650 mg  325-650 mg Oral Q4H PRN Mcarthur Rossetti Angiulli, PA      . amiodarone (PACERONE) tablet 200 mg  200 mg Oral Daily Mcarthur Rossetti Angiulli, PA      . aspirin EC tablet 81 mg  81 mg Oral Daily Daniel J Angiulli, PA      . busPIRone (BUSPAR) tablet 15 mg  15 mg Oral Daily PRN Mcarthur Rossetti Angiulli, PA      . calcium-vitamin D (OSCAL WITH D) 500-200 MG-UNIT per tablet 1 tablet  1 tablet Oral BID WC Daniel J Angiulli, PA      . carvedilol (COREG) tablet 12.5 mg  12.5 mg Oral BID WC Daniel J Angiulli, PA      . ezetimibe (ZETIA) tablet 10 mg  10 mg Oral Daily Daniel J Angiulli, PA      . fentaNYL (DURAGESIC - dosed mcg/hr) 12.5 mcg  12.5 mcg Transdermal Q72H Daniel J Angiulli, PA      . fluticasone (FLONASE) 50 MCG/ACT nasal spray 2 spray  2 spray Each Nare Daily PRN Mcarthur Rossetti Angiulli, PA      . folic acid-pyridoxine-cyancobalamin  (FOLTX) 2.5-25-2 MG per tablet 1 tablet  1 tablet Oral Daily Daniel J Angiulli, PA      . furosemide (LASIX) tablet 20 mg  20 mg Oral Daily Mcarthur Rossetti Angiulli, PA      . HYDROcodone-acetaminophen (NORCO) 5-325 MG per tablet 2 tablet  2 tablet Oral Q4H PRN Mcarthur Rossetti Angiulli, PA      . levothyroxine (SYNTHROID, LEVOTHROID) tablet 150 mcg  150 mcg Oral Q0600 Charlton Amor, PA      .  lisinopril (PRINIVIL,ZESTRIL) tablet 2.5 mg  2.5 mg Oral BID Mcarthur Rossetti Angiulli, PA      . loratadine (CLARITIN) tablet 10 mg  10 mg Oral Daily Mcarthur Rossetti Angiulli, PA      . methocarbamol (ROBAXIN) tablet 500 mg  500 mg Oral Q6H PRN Mcarthur Rossetti Angiulli, PA   500 mg at 07/08/11 1622  . nitroGLYCERIN (NITROSTAT) SL tablet 0.4 mg  0.4 mg Sublingual Q5 min PRN Mcarthur Rossetti Angiulli, PA      . omega-3 acid ethyl esters (LOVAZA) capsule 1,000 mg  1,000 mg Oral Q breakfast Mcarthur Rossetti Angiulli, PA      . ondansetron (ZOFRAN) tablet 4 mg  4 mg Oral Q6H PRN Mcarthur Rossetti Angiulli, PA       Or  . ondansetron (ZOFRAN) injection 4 mg  4 mg Intravenous Q6H PRN Mcarthur Rossetti Angiulli, PA      . pantoprazole (PROTONIX) EC tablet 40 mg  40 mg Oral BID Mcarthur Rossetti Angiulli, PA      . polyethylene glycol (MIRALAX / GLYCOLAX) packet 17 g  17 g Oral Daily Daniel J Angiulli, PA      . potassium chloride SA (K-DUR,KLOR-CON) CR tablet 20 mEq  20 mEq Oral Daily Mcarthur Rossetti Angiulli, PA      . raloxifene (EVISTA) tablet 60 mg  60 mg Oral Daily Mcarthur Rossetti Angiulli, PA      . senna (SENOKOT) tablet 17.2 mg  2 tablet Oral QHS Daniel J Angiulli, PA      . sodium chloride 0.9 % bolus 500 mL  500 mL Intravenous Once Rodolph Bong, MD   500 mL at 07/08/11 1157  . sorbitol 70 % solution 30 mL  30 mL Oral Daily PRN Mcarthur Rossetti Angiulli, PA      . warfarin (COUMADIN) tablet 1 mg  1 mg Oral ONCE-1800 Sorin Luanne Bras, MD   1 mg at 07/07/11 1731  . Warfarin - Pharmacist Dosing Inpatient   Does not apply q1800 Mcarthur Rossetti Angiulli, PA      . DISCONTD: 0.9 %  sodium chloride infusion  250  mL Intravenous PRN Laveda Norman, MD      . DISCONTD: acetaminophen (TYLENOL) suppository 650 mg  650 mg Rectal Q6H PRN Laveda Norman, MD      . DISCONTD: acetaminophen (TYLENOL) tablet 500 mg  500 mg Oral TID Sorin Luanne Bras, MD   500 mg at 07/08/11 1058  . DISCONTD: acetaminophen (TYLENOL) tablet 650 mg  650 mg Oral Q6H PRN Laveda Norman, MD      . DISCONTD: amiodarone (PACERONE) tablet 200 mg  200 mg Oral Daily Laveda Norman, MD   200 mg at 07/08/11 1047  . DISCONTD: aspirin EC tablet 81 mg  81 mg Oral Daily Laveda Norman, MD   81 mg at 07/08/11 1047  . DISCONTD: busPIRone (BUSPAR) tablet 15 mg  15 mg Oral Daily PRN Laveda Norman, MD      . DISCONTD: calcium-vitamin D (OSCAL WITH D) 500-200 MG-UNIT per tablet 1 tablet  1 tablet Oral BID WC Laveda Norman, MD   1 tablet at 07/07/11 1730  . DISCONTD: carvedilol (COREG) tablet 12.5 mg  12.5 mg Oral BID WC Laveda Norman, MD   12.5 mg at 07/08/11 0831  . DISCONTD: docusate sodium (COLACE) capsule 100 mg  100 mg Oral BID Sorin Luanne Bras, MD   100 mg at 07/08/11 1047  . DISCONTD: ezetimibe (ZETIA) tablet  10 mg  10 mg Oral Daily Laveda Norman, MD   10 mg at 07/08/11 1047  . DISCONTD: fentaNYL (DURAGESIC - dosed mcg/hr) 12.5 mcg  12.5 mcg Transdermal Q72H Sorin Luanne Bras, MD   12.5 mcg at 07/07/11 1556  . DISCONTD: fluticasone (FLONASE) 50 MCG/ACT nasal spray 2 spray  2 spray Each Nare Daily PRN Laveda Norman, MD      . DISCONTD: folic acid-pyridoxine-cyancobalamin (FOLTX) 2.5-25-2 MG per tablet 1 tablet  1 tablet Oral Daily Laveda Norman, MD   1 tablet at 07/08/11 1046  . DISCONTD: furosemide (LASIX) tablet 20 mg  20 mg Oral Daily Laveda Norman, MD   20 mg at 07/08/11 1046  . DISCONTD: HYDROcodone-acetaminophen (NORCO) 5-325 MG per tablet 2 tablet  2 tablet Oral Q4H PRN Sorin Luanne Bras, MD   2 tablet at 07/08/11 0540  . DISCONTD: HYDROmorphone (DILAUDID) injection 0.5 mg  0.5 mg Intravenous Q4H PRN Laveda Norman, MD   0.5 mg at 07/07/11 1759  . DISCONTD: levothyroxine (SYNTHROID, LEVOTHROID)  tablet 150 mcg  150 mcg Oral Q0600 Laveda Norman, MD   150 mcg at 07/08/11 0533  . DISCONTD: lisinopril (PRINIVIL,ZESTRIL) tablet 2.5 mg  2.5 mg Oral BID Laveda Norman, MD   2.5 mg at 07/08/11 1046  . DISCONTD: loratadine (CLARITIN) tablet 10 mg  10 mg Oral Daily Laveda Norman, MD   10 mg at 07/08/11 1046  . DISCONTD: methocarbamol (ROBAXIN) tablet 500 mg  500 mg Oral QID Laveda Norman, MD   500 mg at 07/08/11 1435  . DISCONTD: nitroGLYCERIN (NITROSTAT) SL tablet 0.4 mg  0.4 mg Sublingual Q5 min PRN Laveda Norman, MD      . DISCONTD: omega-3 acid ethyl esters (LOVAZA) capsule 1,000 mg  1,000 mg Oral Q breakfast Laveda Norman, MD   1,000 mg at 07/08/11 0831  . DISCONTD: ondansetron (ZOFRAN) injection 4 mg  4 mg Intravenous Q6H PRN Laveda Norman, MD      . DISCONTD: ondansetron (ZOFRAN) tablet 4 mg  4 mg Oral Q6H PRN Laveda Norman, MD      . DISCONTD: oxyCODONE (Oxy IR/ROXICODONE) immediate release tablet 5 mg  5 mg Oral QID Laveda Norman, MD   5 mg at 07/08/11 1435  . DISCONTD: pantoprazole (PROTONIX) EC tablet 40 mg  40 mg Oral BID Laveda Norman, MD   40 mg at 07/08/11 1100  . DISCONTD: polyethylene glycol (MIRALAX / GLYCOLAX) packet 17 g  17 g Oral Daily Laveda Norman, MD   17 g at 07/08/11 1058  . DISCONTD: potassium chloride SA (K-DUR,KLOR-CON) CR tablet 20 mEq  20 mEq Oral Daily Laveda Norman, MD   20 mEq at 07/08/11 1046  . DISCONTD: promethazine (PHENERGAN) injection 12.5 mg  12.5 mg Intravenous Q6H PRN Laveda Norman, MD   12.5 mg at 07/08/11 1445  . DISCONTD: raloxifene (EVISTA) tablet 60 mg  60 mg Oral Daily Sorin Luanne Bras, MD   60 mg at 07/08/11 1047  . DISCONTD: senna (SENOKOT) tablet 17.2 mg  2 tablet Oral QHS Sorin Luanne Bras, MD      . DISCONTD: sodium chloride 0.9 % injection 3 mL  3 mL Intravenous Q12H Laveda Norman, MD   3 mL at 07/08/11 1047  . DISCONTD: sodium chloride 0.9 % injection 3 mL  3 mL Intravenous Q12H Laveda Norman, MD   3 mL at 07/06/11 2149  . DISCONTD:  sodium chloride 0.9 % injection 3 mL  3 mL  Intravenous PRN Laveda Norman, MD      . DISCONTD: warfarin (COUMADIN) tablet 3 mg  3 mg Oral q1800 Elonda Husky, PHARMD   3 mg at 07/06/11 1729  . DISCONTD: Warfarin - Pharmacist Dosing Inpatient   Does not apply q1800 Elonda Husky, PHARMD       Medications Prior to Admission  Medication Sig Dispense Refill  . amiodarone (PACERONE) 200 MG tablet Take 200 mg by mouth daily.      [name] aspirin 81 MG EC tablet Take 81 mg by mouth daily.        . busPIRone (BUSPAR) 15 MG tablet Take 15 mg by mouth daily as needed. For mood stabilization.      . carvedilol (COREG) 12.5 MG tablet Take 12.5 mg by mouth 2 (two) times daily.      . cyclobenzaprine (FLEXERIL) 5 MG tablet Take 5 mg by mouth 2 (two) times daily as needed. For muscle spasms.      [name] ezetimibe (ZETIA) 10 MG tablet Take 10 mg by mouth daily.      . fexofenadine (ALLEGRA) 180 MG tablet Take 180 mg by mouth daily.       . fish oil-omega-3 fatty acids 1000 MG capsule Take 1,000 mg by mouth daily.        . fluticasone (FLONASE) 50 MCG/ACT nasal spray Place 2 sprays into the nose daily as needed. For allergies       . folic acid-pyridoxine-cyancobalamin (FOLTX) 2.5-25-2 MG TABS Take 1 tablet by mouth daily.        [name] HYDROcodone-acetaminophen (NORCO) 5-325 MG per tablet Take 1 tablet by mouth every 4 (four) hours as needed. For pain       . levothyroxine (SYNTHROID, LEVOTHROID) 150 MCG tablet Take 150 mcg by mouth daily.      . methocarbamol (ROBAXIN) 500 MG tablet Take 500 mg by mouth 4 (four) times daily as needed. For spasms       . nitroGLYCERIN (NITROSTAT) 0.4 MG SL tablet Place 0.4 mg under the tongue every 5 (five) minutes as needed. For chest pain       . oxyCODONE-acetaminophen (PERCOCET) 5-325 MG per tablet Take 1 tablet by mouth every 4 (four) hours as needed. For pain       . pantoprazole (PROTONIX) 40 MG tablet Take 40 mg by mouth 2 (two) times daily.       . potassium chloride SA (K-DUR,KLOR-CON) 20 MEQ tablet Take 20 mEq by mouth  daily.        [name] warfarin (COUMADIN) 3 MG tablet Take 3 mg by mouth daily.       . methocarbamol (ROBAXIN) 500 MG tablet Take 500 mg by mouth every 6 (six) hours as needed. For muscle spasms         Home: Home Living Lives With: Alone Available Help at Discharge: Family Type of Home: House Home Access: Stairs to enter Secretary/administrator of Steps: 2 Entrance Stairs-Rails: Left Home Layout: One level Bathroom Shower/Tub: Tub/shower unit;Curtain Bathroom Toilet: Handicapped height Bathroom Accessibility: Yes How Accessible: Accessible via walker Home Adaptive Equipment: Bedside commode/3-in-1;Shower chair with back;Walker - rolling;Reacher   Functional History: Prior Function Level of Independence: Independent with homemaking with ambulation;Independent with transfers Able to Take Stairs?: Yes Driving: Yes Vocation: Retired  Functional Status:  Mobility: Bed Mobility Bed Mobility: Rolling Right;Right Sidelying to Sit;Sit to Supine;Supine to Sit Rolling Right: 4: Min assist  Left Sidelying to Sit: 4: Min assist Supine to Sit: 4: Min assist Sit to Supine: 3: Mod assist Transfers Sit to Stand: 3: Mod assist;From bed;With upper extremity assist;Other (comment)      ADL:    Cognition: Cognition Overall Cognitive Status: Appears within functional limits for tasks assessed Arousal/Alertness: Awake/alert Orientation Level: Oriented X4 Attention: Alternating;Selective Alternating Attention: Appears intact Memory: Appears intact Cognition Arousal/Alertness: Awake/alert   Blood pressure 101/63, pulse 50, temperature 97.6 F (36.4 C), temperature source Oral, resp. rate 20, weight 76.068 kg (167 lb 11.2 oz), SpO2 98.00%. Physical Exam  Vitals reviewed.  Constitutional: She is oriented to person, place, and time. She appears well-developed.  HENT:  Head: Normocephalic.  Neck: Normal range of motion. Neck supple. No thyromegaly present.  Cardiovascular: Regular rhythm.   Pulmonary/Chest: Breath sounds normal. She has no wheezes.  Abdominal: She exhibits no distension. There is no tenderness.  Musculoskeletal: She exhibits no edema.  Neurological: She is alert and oriented to person, place, and time.  Skin: Skin is warm and dry.  Psychiatric: She has a normal mood and affect.  motor strength is 4/5 in bilateral hip flexors knee extensors ankle dorsiflexors  Motor strength is 5/5 in bilateral deltoid, biceps, triceps, grip  Sensory exam shows decreased left L4, L5 and S1 dermatomal distribution sensory loss Back has tenderness to palpation over the upper lumbar spinous processes  Results for orders placed during the hospital encounter of 07/05/11 (from the past 48 hour(s))  PROTIME-INR     Status: Abnormal   Collection Time   07/07/11  5:00 AM      Component Value Range Comment   Prothrombin Time 31.2 (*) 11.6 - 15.2 (seconds)    INR 2.95 (*) 0.00 - 1.49    PROTIME-INR     Status: Abnormal   Collection Time   07/08/11  5:35 AM      Component Value Range Comment   Prothrombin Time 33.2 (*) 11.6 - 15.2 (seconds)    INR 3.19 (*) 0.00 - 1.49     No results found.  Post Admission Physician Evaluation: 1. Functional deficits secondary  to L2 compression fracture sustained during bending. 2. Patient is admitted to receive collaborative, interdisciplinary care between the physiatrist, rehab nursing staff, and therapy team. 3. Patient's level of medical complexity and substantial therapy needs in context of that medical necessity cannot be provided at a lesser intensity of care such as a SNF. 4. Patient has experienced substantial functional loss from his/her baseline which was documented above under the "Functional History" and "Functional Status" headings.  Judging by the patient's diagnosis, physical exam, and functional history, the patient has potential for functional progress which will result in measurable gains while on inpatient rehab.  These gains will  be of substantial and practical use upon discharge  in facilitating mobility and self-care at the household level. 5. Physiatrist will provide 24 hour management of medical needs as well as oversight of the therapy plan/treatment and provide guidance as appropriate regarding the interaction of the two. 6. 24 hour rehab nursing will assist with bowel management, safety, skin/wound care, disease management, medication administration, pain management and patient education  and help integrate therapy concepts, techniques,education, etc. 7. PT will assess and treat for:  Gait training, mobility, lower extremity strengthening, safety, osteoporosis education.  Goals are: Modified independent with mobility. 8. OT will assess and treat for: ADLs, safety, endurance, osteoporosis spine education.   Goals are: Modified independent ADLs. 9. SLP will assess and  treat for: Not applicable.  Goals are: Not applicable. 10. Case Management and Social Worker will assess and treat for psychological issues and discharge planning. 11. Team conference will be held weekly to assess progress toward goals and to determine barriers to discharge. 12.  Patient will receive at least 3 hours of therapy per day at least 5 days per week. 13. ELOS and Prognosis: 7-10 days excellent   Medical Problem List and Plan: 1. Lumbar L2 compression fracture with osteoporosis. Conservative care with a back brace 2. DVT Prophylaxis/Anticoagulation: Chronic Coumadin therapy for history of left ventricular mural thrombus. Monitor for any signs of bleeding 3. Pain Management: Duragesic patch 12.5 mcg change every 72 hours and will titrate as needed. Robaxin as needed. Vicodin as needed 4. Hypertension. Amiodarone 200 mg daily, Correa 12.5 mg twice a day, Lasix 20 mg daily, lisinopril 2.5 mg twice daily. Monitor with increased mobility 5. Hypothyroidism. Synthroid Hyperlipidemia. zetia 6. Chronic renal insufficiency. Baseline creatinine 1.2-1.5.  Strict I&O 7. Anxiety/depression. BuSpar as needed 8 diverticulitis. Protonix 9. Chronic left lumbosacral radiculopathy  Mikya Don E 07/08/2011, 6:46 PM

## 2011-07-08 NOTE — PMR Pre-admission (Signed)
PMR Admission Coordinator Pre-Admission Assessment  Patient: [name] is an 61 y.o., female MRN: [id number] DOB: 19-Feb-1951 Height: 5\' 4"  (162.6 cm) Weight: 74.7 kg (164 lb 10.9 oz)  Insurance Information HMO:     PPO:      PCP:      IPA:      80/20: yes     OTHER: no HMO PRIMARY: Medicare a and b      Policy#: [id number]      Subscriber: pt Benefits:  Phone #: vision share     Name: 4/19 Eff. Date: 03/22/07     Deduct: $1184      Out of Pocket Max: none      Life Max: none CIR: 100%      SNF: 20 full days LBD 02/18/11 Outpatient: 80%     Co-Pay: 20% Home Health: 100%      Co-Pay: none DME: 80%     Co-Pay: 20& Providers: pt choice  SECONDARY: Mikael Spray      Policy#: [id number]      Subscriber: pt 4/19 [phone] ref # [id number] notification only for medicare in primary  Emergency Contact Information Contact Information    Name Relation Home Work Mobile   Rochelle,Young Brother   [phone]     Current Medical History  Patient Admitting Diagnosis: L2 compression fracture with osteoporosis  History of Present Illness: 61 year old right-handed African American female well-known rehabilitation services with history of multiple back surgeries. Last rehabilitation admission was in December of 2011 Admitted April 16 when she noted severe low back pain when bending over limiting her overall mobility. X-ray and imaging revealed mild lumbar L2 compression fracture. She was seen by neurosurgery advised conservative care. She is placed in a TLSO brace that she had from previous surgeries. Pain management ongoing with low-dose Duragesic patch added today. Patient remains on chronic Coumadin therapy for history left ventricular mural thrombus.  Past Medical History  Past Medical History  Diagnosis Date  . Spinal stenosis   . Numbness of foot     left foot  . Diverticulitis     Status post partial colectomy 1/12 with reversal in July 2012  . Colitis, ischemic   . DJD  (degenerative joint disease)     History of multiple surgeries to the back, shoulder and knee  . Ischemic cardiomyopathy     Echo 06/16/10: EF 25-30%, anteroseptal and apical hypokinesis, moderate AI, mild MR  . Chronic systolic heart failure   . CAD (coronary artery disease)     a.  Ant MI 8/03 with stenting of the LAD;  b. staged PCI with Cypher DES to OM1 8/03;  c. s/p Cypher DES to LAD 7/04;  d.  multiple cardiac caths in past (chronically abnl ECG);    e. cardiac catheterization 3/12: LAD stent patent, distal LAD 40-50%, small ostial D1 80%, ostial D2 60%, OM-1 stent patent (20-30%), proximal RCA 50%, proximal to mid RCA 40-50%; f. cath 02/17/2011 - nonobs  . LV (left ventricular) mural thrombus     Chronic Coumadin therapy  . CKD (chronic kidney disease), stage III     creatinine:  1.3 in 4/12;    . Tobacco abuse     history  . GERD (gastroesophageal reflux disease)   . Hypertension   . Lower GI bleed     August 2011  . Hypothyroidism   . Aortic insufficiency   . Pseudoaneurysm     History of, right forearm  . Hyperlipidemia   .  OA (osteoarthritis)   . Abnormal EKG     Chronically abnormal EKG.   . Spinal stenosis   . Numbness of foot     Left foot  . Compression fracture 07/04/11    L2  . Anterior myocardial infarction 10/2001  . CHF (congestive heart failure)   . SVT (supraventricular tachycardia)     ? h/o AFib;  patient on long term amiodarone therapy  . PAF (paroxysmal atrial fibrillation)   . Anatomical narrow angle of right eye   . Heart murmur     "age 8-19"  . Shortness of breath     "at times; related to CHF"  . Shortness of breath on exertion   . 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 Graciela Husbands.   [name] AICD (automatic cardioverter/defibrillator) present 10/2004    explant; implant  . Blood transfusion   . Anemia   . H/O hiatal hernia   . Stroke     History of TIA and possible CVA; hospitalized 2004; on coumadin    Family  History  family history includes Arthritis in an unspecified family member; Diabetes in her brother and mother; and Prostate cancer in an unspecified family member.  Prior Rehab/Hospitalizations: cir 2011 has been 3 times   Current Medications  Current facility-administered medications:0.9 %  sodium chloride infusion, 250 mL, Intravenous, PRN, Laveda Norman, MD;  acetaminophen (TYLENOL) suppository 650 mg, 650 mg, Rectal, Q6H PRN, Laveda Norman, MD;  acetaminophen (TYLENOL) tablet 500 mg, 500 mg, Oral, TID, Sorin C Laza, MD, 500 mg at 07/08/11 1058;  acetaminophen (TYLENOL) tablet 650 mg, 650 mg, Oral, Q6H PRN, Laveda Norman, MD amiodarone (PACERONE) tablet 200 mg, 200 mg, Oral, Daily, Laveda Norman, MD, 200 mg at 07/08/11 1047;  aspirin EC tablet 81 mg, 81 mg, Oral, Daily, Laveda Norman, MD, 81 mg at 07/08/11 1047;  busPIRone (BUSPAR) tablet 15 mg, 15 mg, Oral, Daily PRN, Laveda Norman, MD;  calcium-vitamin D (OSCAL WITH D) 500-200 MG-UNIT per tablet 1 tablet, 1 tablet, Oral, BID WC, Laveda Norman, MD, 1 tablet at 07/07/11 1730 carvedilol (COREG) tablet 12.5 mg, 12.5 mg, Oral, BID WC, Laveda Norman, MD, 12.5 mg at 07/08/11 0831;  docusate sodium (COLACE) capsule 100 mg, 100 mg, Oral, BID, Sorin C Laza, MD, 100 mg at 07/08/11 1047;  ezetimibe (ZETIA) tablet 10 mg, 10 mg, Oral, Daily, Laveda Norman, MD, 10 mg at 07/08/11 1047;  fentaNYL (DURAGESIC - dosed mcg/hr) 12.5 mcg, 12.5 mcg, Transdermal, Q72H, Sorin C Laza, MD, 12.5 mcg at 07/07/11 1556 fluticasone (FLONASE) 50 MCG/ACT nasal spray 2 spray, 2 spray, Each Nare, Daily PRN, Laveda Norman, MD;  folic acid-pyridoxine-cyancobalamin (FOLTX) 2.5-25-2 MG per tablet 1 tablet, 1 tablet, Oral, Daily, Laveda Norman, MD, 1 tablet at 07/08/11 1046;  furosemide (LASIX) tablet 20 mg, 20 mg, Oral, Daily, Laveda Norman, MD, 20 mg at 07/08/11 1046 HYDROcodone-acetaminophen (NORCO) 5-325 MG per tablet 2 tablet, 2 tablet, Oral, Q4H PRN, Sorin C Lavera Guise, MD, 2 tablet at 07/08/11 0540;  HYDROmorphone  (DILAUDID) injection 0.5 mg, 0.5 mg, Intravenous, Q4H PRN, Laveda Norman, MD, 0.5 mg at 07/07/11 1759;  levothyroxine (SYNTHROID, LEVOTHROID) tablet 150 mcg, 150 mcg, Oral, Q0600, Laveda Norman, MD, 150 mcg at 07/08/11 0533 lisinopril (PRINIVIL,ZESTRIL) tablet 2.5 mg, 2.5 mg, Oral, BID, Laveda Norman, MD, 2.5 mg at 07/08/11 1046;  loratadine (CLARITIN) tablet 10 mg, 10 mg, Oral, Daily, Osie Cheeks  Oti, MD, 10 mg at 07/08/11 1046;  methocarbamol (ROBAXIN) tablet 500 mg, 500 mg, Oral, QID, Laveda Norman, MD, 500 mg at 07/08/11 1047;  nitroGLYCERIN (NITROSTAT) SL tablet 0.4 mg, 0.4 mg, Sublingual, Q5 min PRN, Laveda Norman, MD omega-3 acid ethyl esters (LOVAZA) capsule 1,000 mg, 1,000 mg, Oral, Q breakfast, Laveda Norman, MD, 1,000 mg at 07/08/11 0831;  ondansetron (ZOFRAN) injection 4 mg, 4 mg, Intravenous, Q6H PRN, Laveda Norman, MD;  ondansetron [facility]) tablet 4 mg, 4 mg, Oral, Q6H PRN, Laveda Norman, MD;  oxyCODONE (Oxy IR/ROXICODONE) immediate release tablet 5 mg, 5 mg, Oral, QID, Laveda Norman, MD, 5 mg at 07/08/11 1101 pantoprazole (PROTONIX) EC tablet 40 mg, 40 mg, Oral, BID, Laveda Norman, MD, 40 mg at 07/08/11 1100;  polyethylene glycol (MIRALAX / GLYCOLAX) packet 17 g, 17 g, Oral, Daily, Laveda Norman, MD, 17 g at 07/08/11 1058;  potassium chloride SA (K-DUR,KLOR-CON) CR tablet 20 mEq, 20 mEq, Oral, Daily, Laveda Norman, MD, 20 mEq at 07/08/11 1046;  promethazine (PHENERGAN) injection 12.5 mg, 12.5 mg, Intravenous, Q6H PRN, Laveda Norman, MD, 12.5 mg at 07/07/11 2114 raloxifene (EVISTA) tablet 60 mg, 60 mg, Oral, Daily, Sorin C Laza, MD, 60 mg at 07/08/11 1047;  senna (SENOKOT) tablet 17.2 mg, 2 tablet, Oral, QHS, Sorin C Laza, MD;  sodium chloride 0.9 % bolus 500 mL, 500 mL, Intravenous, Once, Rodolph Bong, MD;  sodium chloride 0.9 % injection 3 mL, 3 mL, Intravenous, Q12H, Laveda Norman, MD, 3 mL at 07/08/11 1047 sodium chloride 0.9 % injection 3 mL, 3 mL, Intravenous, Q12H, Laveda Norman, MD, 3 mL at 07/06/11 2149;  sodium  chloride 0.9 % injection 3 mL, 3 mL, Intravenous, PRN, Laveda Norman, MD;  warfarin (COUMADIN) tablet 1 mg, 1 mg, Oral, ONCE-1800, Sorin C Laza, MD, 1 mg at 07/07/11 1731;  Warfarin - Pharmacist Dosing Inpatient, , Does not apply, q1800, Elonda Husky, PHARMD DISCONTD: warfarin (COUMADIN) tablet 3 mg, 3 mg, Oral, q1800, Elonda Husky, PHARMD, 3 mg at 07/06/11 1729  Patients Current Diet: Cardiac  Precautions / Restrictions Precautions Precautions: Back Precaution Booklet Issued: Yes (comment) Precaution Comments: Reviewed back precautions Spinal Brace: Thoracolumbosacral orthotic;Applied in sitting position Restrictions Weight Bearing Restrictions: No   Prior Activity Level Community (5-7x/wk): active and going to OP rehab pta Journalist, newspaper / Equipment Home Assistive Devices/Equipment: Eyeglasses;Shower chair with back Home Adaptive Equipment: Bedside commode/3-in-1;Shower chair with back;Walker - rolling;Reacher  Prior Functional Level Prior Function Level of Independence: Independent Able to Take Stairs?: Yes Driving: Yes Vocation: Retired  Current Functional Level Cognition  Arousal/Alertness: Awake/alert Overall Cognitive Status: Appears within functional limits for tasks assessed Orientation Level: Oriented X4    Sensation  Light Touch: Appears Intact    Coordination  Gross Motor Movements are Fluid and Coordinated: Yes Fine Motor Movements are Fluid and Coordinated: Yes    ADLs  Eating/Feeding: Simulated Where Assessed - Eating/Feeding: Bed level Grooming: Performed;Wash/dry hands;Wash/dry face;Teeth care;Brushing hair;Set up Where Assessed - Grooming: Sitting, chair Upper Body Bathing: Simulated;Minimal assistance Where Assessed - Upper Body Bathing: Supine, head of bed flat Lower Body Bathing: Simulated;+1 Total assistance Where Assessed - Lower Body Bathing: Rolling right and/or left Upper Body Dressing: Performed;Minimal assistance Where  Assessed - Upper Body Dressing: Sitting, bed;Unsupported Lower Body Dressing: Performed;Moderate assistance Where Assessed - Lower Body Dressing: Sit to stand from chair Toilet Transfer: Performed;Minimal assistance Toilet Transfer Method: Ambulating Toilet Transfer Equipment: Bedside  commode Toileting - Clothing Manipulation: Simulated;Maximal assistance Where Assessed - Glass blower/designer Manipulation: Standing Toileting - Hygiene: Performed;Supervision/safety Where Assessed - Toileting Hygiene: Sit on 3-in-1 or toilet Tub/Shower Transfer: Not assessed Equipment Used: Rolling walker Ambulation Related to ADLs: Pt took four steps with rolling walker.  Pt gets anxious. Does well when calmed down. ADL Comments: Pt did better with LE adls today.  Pt was able to cross both legs up to knees for LE dressing.    Mobility  Bed Mobility DO NOT USE: Yes Rolling Right: 4: Min assist;With rail Rolling Left: 5: Supervision;With rail Rolling Left Details (indicate cue type and reason): cues for log roll and no twisting Right Sidelying to Sit: HOB elevated (comment degrees);4: Min assist (60) Right Sidelying to Sit Details (indicate cue type and reason): Initially attempt to move sidelying to sit, only achieved 3/4 motion and pt. limited by pain 10/10.  Returned to sidelying, and slowly increased the HOB to 60 and pt then able to transition to sitting Left Sidelying to Sit: 1: +2 Total assist;Patient percentage (comment);HOB flat (pt 50%) Left Sidelying to Sit Details (indicate cue type and reason): cues for sequencing and use of UEs.   Sitting - Scoot to Edge of Bed: 6: Modified independent (Device/Increase time) Sit to Sidelying Right: 4: Min assist;With rail;HOB elevated (comment degrees) (60)    Transfers  Transfers DO NOT USE: Yes Sit to Stand: 4: Min assist;With upper extremity assist;From bed;From elevated surface Sit to Stand Details (indicate cue type and reason): encouragment needed. Stand  to Sit: 4: Min assist;With upper extremity assist;With armrests;To chair/3-in-1 Stand to Sit Details: cues to go down slowly and reach for arm rests. Stand Pivot Transfers: 4: Min Actuary Details (indicate cue type and reason): cues for encouragement and movement of RW    Ambulation / Gait / Stairs / Psychologist, prison and probation services  Ambulation/Gait Stairs: No Corporate treasurer: No    Posture / Balance Posture/Postural Control Posture/Postural Control: No significant limitations     Previous Home Environment Living Arrangements: Alone Lives With: Alone Type of Home: House Home Layout: One level Home Access: Stairs to enter Entrance Stairs-Rails: Left Entrance Stairs-Number of Steps: 2 Bathroom Shower/Tub: Forensic scientist: Handicapped height Bathroom Accessibility: Yes How Accessible: Accessible via walker Home Care Services: No  Discharge Living Setting Plans for Discharge Living Setting: Patient's home Type of Home at Discharge: House Discharge Home Layout: One level;Full bath on main level Discharge Home Access: Stairs to enter Entrance Stairs-Number of Steps: 2 steps Discharge Bathroom Shower/Tub: Tub/shower unit Discharge Bathroom Toilet: Handicapped height Discharge Bathroom Accessibility: Yes How Accessible: Accessible via walker Do you have any problems obtaining your medications?: No  Social/Family/Support Systems Patient Roles: Other (Comment) (sibling) Contact Information: Shelba Flake, brother Anticipated Caregiver: brother can check in prn Anticipated Caregiver's Contact Information: cell [phone] Ability/Limitations of Caregiver: supervision  Caregiver Availability: Intermittent Discharge Plan Discussed with Primary Caregiver: Yes Is Caregiver In Agreement with Plan?: Yes Does Caregiver/Family have Issues with Lodging/Transportation while Pt is in Rehab?: No  Goals/Additional  Needs Patient/Family Goal for Rehab: Mod I PT and OT Expected length of stay: ELOS 7 to 10 days Pt/Family Agrees to Admission and willing to participate: Yes Program Orientation Provided & Reviewed with Pt/Caregiver Including Roles  & Responsibilities: Yes  Patient Condition: This patient's condition remains as documented in the Consult dated 07/07/11, in which the Rehabilitation Physician determined and documented that the patient's condition is appropriate for intensive rehabilitative care  in an inpatient rehabilitation facility.  Preadmission Screen Completed By:  Clois Dupes, 07/08/2011 11:49 AM ______________________________________________________________________   Discussed status with Dr. Wynn Banker on 07/08/11 at  1100 and received telephone approval for admission today.  Admission Coordinator:  Clois Dupes, time 0454 Date 07/08/11.

## 2011-07-08 NOTE — Progress Notes (Signed)
PT TREATMENT NOTE   07/08/11 1500  PT Visit Information  Last PT Received On 07/08/11  PT Time Calculation  PT Start Time 1015  PT Stop Time 1041  PT Time Calculation (min) 26 min  Precautions  Precautions Back  Precaution Comments Reviewed back precautions  Required Braces or Orthoses Spinal Brace  Spinal Brace TLSO;Applied in sitting position  Restrictions  Weight Bearing Restrictions No  Cognition  Overall Cognitive Status Appears within functional limits for tasks assessed/performed  Arousal/Alertness Awake/alert  Behavior During Session [facility] for tasks performed  Bed Mobility  Bed Mobility Rolling Right;Right Sidelying to Sit  Rolling Right 4: Min assist;With rail  Right Sidelying to Sit With rails;HOB elevated;3: Mod assist  Sitting - Scoot to Edge of Bed 6: Modified independent (Device/Increase time)  Details for Bed Mobility Assistance vc's for log rolling and safety  Transfers  Transfers Sit to Stand;Stand to Sit  Sit to Stand 3: Mod assist;From bed;With upper extremity assist;Other (comment) (second person available for safety)  Stand to Sit 4: Min assist;With upper extremity assist;With armrests;To chair/3-in-1  Details for Transfer Assistance greatly increased time due to pain and anticipation of pain  Ambulation/Gait  Ambulation/Gait Assistance 4: Min guard  Ambulation Distance (Feet) 4 Feet  Assistive device Rolling walker  Ambulation/Gait Assistance Details cues for erect posture and RW use  Gait Pattern Step-to pattern;Decreased step length - right;Decreased step length - left;Trunk flexed  Gait velocity decreased  Stairs No  PT - End of Session  Equipment Utilized During Treatment Gait belt;Back brace  Activity Tolerance Patient tolerated treatment well;Patient limited by pain  Patient left in chair;with call bell/phone within reach  Nurse Communication Mobility status  PT - Assessment/Plan  Comments on Treatment Session Pt. needs greatly increased time to  complete mobility tasks due to pain with movement.  Overall, she is motivated to do as much as possible for herself but is not able to DC directly home  PT Plan Discharge plan remains appropriate  PT Frequency Min 5X/week  Follow Up Recommendations Inpatient Rehab  Equipment Recommended Defer to next venue  Acute Rehab PT Goals  PT Goal: Rolling Supine to Right Side - Progress Progressing toward goal  PT Goal: Supine/Side to Sit - Progress Progressing toward goal  PT Goal: Sit to Stand - Progress Progressing toward goal  PT Goal: Stand to Sit - Progress Progressing toward goal  PT Goal: Ambulate - Progress Progressing toward goal  PT Goal: Up/Down Stairs - Progress Other (comment) (not yet able to attempt)  Additional Goals  PT Goal: Additional Goal #1 - Progress Progressing toward goal   Weldon Picking PT Acute Rehab Services [phone] Bee[place] [phone]

## 2011-07-09 DIAGNOSIS — Z5189 Encounter for other specified aftercare: Secondary | ICD-10-CM

## 2011-07-09 DIAGNOSIS — S22009A Unspecified fracture of unspecified thoracic vertebra, initial encounter for closed fracture: Secondary | ICD-10-CM

## 2011-07-09 LAB — PROTIME-INR
INR: 3.39 — ABNORMAL HIGH (ref 0.00–1.49)
Prothrombin Time: 34.8 seconds — ABNORMAL HIGH (ref 11.6–15.2)

## 2011-07-09 NOTE — Progress Notes (Signed)
Occupational Therapy Session Note  Patient Details  Name: [name] MRN: [id number] Date of Birth: 09-12-1950  Today's Date: 07/09/2011 Time: 0908-1005 Time Calculation (min): 57 min  Short Term Goals:  Skilled Therapeutic Interventions: ADL-retraining at sink side with emphasis on safe transfers, pain management (repositioning and use of DME), improving activity tolerance, and functional mobility using w/c (provided this date).  Patient reports her pain as varying in intensity and being unpredictable. Although patient was eventually able to appreciate that any trunk rotation would exacerbate pain, repeated review of need to avoid rotation of trunk during bed mobility and transfers was required during session.   Patient is aware of need for use of TLSO during ambulation but was unable to take steps with OT during morning treatment.   Patient reported nausea and vomited portion of morning meal after transfer to [facility] for toileting but recovered after 5 minute rest and requested assist to sink side, after transferring to her bedside recliner.   Once placed at sink side patient was then assisted to transfer to w/c retrieved by OT improved seated bathing, where she completed grooming to progress to bathing, assisted by [employer].   Therapy Documentation Precautions:  Precautions Precautions: Back Precaution Booklet Issued: No (already has booklet) Precaution Comments: Reviewed back precautions Required Braces or Orthoses: Spinal Brace (TLSO) Spinal Brace: Thoracolumbosacral orthotic;Applied in sitting position Restrictions Weight Bearing Restrictions: No  Pain: Pain Assessment Pain Assessment: 0-10 Pain Score:   4 Pain Type: Acute pain Pain Location: Back Pain Orientation: Lower Pain Descriptors: Aching Pain Onset: On-going Patients Stated Pain Goal: 2 Pain Intervention(s): Medication (See eMAR) Multiple Pain Sites: No   Therapy/Group: Individual Therapy  Session #2 Time:  1400-1430 Time Calculation (min):  Skilled Therapeutic Interventions: ADL-retraining with emphasis on transfers, functional mobility using RW, toileting, and management of orthotics (TLSO).   Patient requested to toilet (BM) during pm treatment session and was able to accept challenge to ambulate using RW and perform her own toilet hygeine.   Patient required assist with clothing management (gown) and diaper due to TLSO.     Therapy: Individual Therapy  Georgeanne Nim 07/09/2011, 16:21 PM

## 2011-07-09 NOTE — Progress Notes (Signed)
ANTICOAGULATION CONSULT NOTE - Follow Up Consult  Pharmacy Consult for coumadin Indication: atrial fibrillation and h/o mural thrombus  Allergies  Allergen Reactions  . Penicillins Swelling    Throat swells  . Prevacid Nausea Only  . Statins Other (See Comments)    cramps    Patient Measurements: Weight: 167 lb 11.2 oz (76.068 kg) Heparin Dosing Weight:   Vital Signs: Temp: 98.1 F (36.7 C) (04/20 0546) Temp src: Oral (04/20 0546) BP: 90/57 mmHg (04/20 0546) Pulse Rate: 50  (04/20 0546)  Labs:  Alvira Philips 07/09/11 0617 07/08/11 0535 07/07/11 0500  HGB -- -- --  HCT -- -- --  PLT -- -- --  APTT -- -- --  LABPROT 34.8* 33.2* 31.2*  INR 3.39* 3.19* 2.95*  HEPARINUNFRC -- -- --  CREATININE -- -- --  CKTOTAL -- -- --  CKMB -- -- --  TROPONINI -- -- --   The CrCl is unknown because both a height and weight (above a minimum accepted value) are required for this calculation.   Medications:  Scheduled:    . amiodarone  200 mg Oral Daily  . aspirin EC  81 mg Oral Daily  . calcium-vitamin D  1 tablet Oral BID WC  . carvedilol  12.5 mg Oral BID WC  . ezetimibe  10 mg Oral Daily  . fentaNYL  12.5 mcg Transdermal Q72H  . folic acid-pyridoxine-cyancobalamin  1 tablet Oral Daily  . furosemide  20 mg Oral Daily  . levothyroxine  150 mcg Oral Q0600  . lisinopril  2.5 mg Oral BID  . loratadine  10 mg Oral Daily  . omega-3 acid ethyl esters  1,000 mg Oral Q breakfast  . pantoprazole  40 mg Oral BID  . polyethylene glycol  17 g Oral Daily  . potassium chloride SA  20 mEq Oral Daily  . raloxifene  60 mg Oral Daily  . senna  2 tablet Oral QHS  . Warfarin - Pharmacist Dosing Inpatient   Does not apply q1800    Assessment: [name] on coumadin for chronic Afib. INR is above goal, no coumadin ordered last night  Goal of Therapy:  INR 2-3   Plan:  - No coumadin for elevated INR.  - Daily INR  [name] 07/09/2011,2:14 PM

## 2011-07-09 NOTE — Progress Notes (Signed)
Physical Therapy Note  Patient Details  Name: [name] MRN: [id number] Date of Birth: [birth year] Today's Date: 07/09/2011 Time: 4098-1191 (45') Pain: None reported Precautions: Spinal, TLSO brace on when oob,   Wheelchair management (15') Propelling w/c x 150' with S/Independence, S/min-A for footrest management for safe transfers Gait Training (15') using RW x 30' S/Mod-I, up/down 3 steps using handrail on Left side ascending with min-A Therapeutic Exercise (15') B LE's in supine and in sitting  Patient fatigues quickly and required rest breaks throughout session.  Ascending stairs using R LE to advance upward and down steps with reciprocal gait.  Weakness on L hipflexors 3-/5. Individual Therapy Session   Rex Kras 07/09/2011, 2:41 PM

## 2011-07-09 NOTE — Progress Notes (Signed)
Patient ID: [name], female   DOB: 04/12/1950, 61 y.o.   MRN: [id number]   4/20.  Alert; no complaints stable status post L2 compression fracture. Chest clear. Cardiac exam revealed normal S1-S2 abdomen soft nontender no distention. Extremities no edema Lab Results  Component Value Date   INR 3.39* 07/09/2011   INR 3.19* 07/08/2011   INR 2.95* 07/07/2011     HPI: 61 year old right-handed African American female well-known rehabilitation services with history of multiple back surgeries. Last rehabilitation admission was in December of 2011 Admitted April 16 when she noted severe low back pain when bending over limiting her overall mobility. X-ray and imaging revealed mild lumbar L2 compression fracture. She was seen by neurosurgery advised conservative care. She is placed in a TLSO brace that she had from previous surgeries. Pain management ongoing with low-dose Duragesic patch added today. Patient remains on chronic Coumadin therapy for history left ventricular mural thrombus. Physical and occupational therapy evaluation completed recommendation physical medicine rehabilitation consult to consider inpatient rehabilitation services  Her other rehabilitation admissions were for left lower extremity radiculopathy related to spinal stenosis  Review of Systems  Cardiovascular: Positive for palpitations.  Gastrointestinal: Positive for constipation.  Musculoskeletal: Positive for myalgias, back pain and joint pain.  All other systems reviewed and are negative  Past Medical History   Diagnosis  Date   .  Spinal stenosis    .  Numbness of foot      left foot   .  Diverticulitis      Status post partial colectomy 1/12 with reversal in July 2012   .  Colitis, ischemic    .  DJD (degenerative joint disease)      History of multiple surgeries to the back, shoulder and knee   .  Ischemic cardiomyopathy      Echo 06/16/10: EF 25-30%, anteroseptal and apical hypokinesis, moderate AI, mild MR   .   Chronic systolic heart failure    .  CAD (coronary artery disease)      a. Ant MI 8/03 with stenting of the LAD; b. staged PCI with Cypher DES to OM1 8/03; c. s/p Cypher DES to LAD 7/04; d. multiple cardiac caths in past (chronically abnl ECG); e. cardiac catheterization 3/12: LAD stent patent, distal LAD 40-50%, small ostial D1 80%, ostial D2 60%, OM-1 stent patent (20-30%), proximal RCA 50%, proximal to mid RCA 40-50%; f. cath 02/17/2011 - nonobs   .  LV (left ventricular) mural thrombus      Chronic Coumadin therapy   .  CKD (chronic kidney disease), stage III      creatinine: 1.3 in 4/12;   .  Tobacco abuse      history   .  GERD (gastroesophageal reflux disease)    .  Hypertension    .  Lower GI bleed      August 2011   .  Hypothyroidism    .  Aortic insufficiency    .  Pseudoaneurysm      History of, right forearm   .  Hyperlipidemia    .  OA (osteoarthritis)    .  Abnormal EKG      Chronically abnormal EKG.   .  Spinal stenosis    .  Numbness of foot      Left foot   .  Compression fracture  07/04/11     L2   .  Anterior myocardial infarction  10/2001   .  CHF (congestive heart failure)    .  SVT (supraventricular tachycardia)      ? h/o AFib; patient on long term amiodarone therapy   .  PAF (paroxysmal atrial fibrillation)    .  Anatomical narrow angle of right eye    .  Heart murmur      "age [age]"   .  Shortness of breath      "at times; related to CHF"   .  Shortness of breath on exertion    .  ICD (implantable cardiac defibrillator) in place  07/2003     followed by Dr. Graciela Husbands.   [name]  AICD (automatic cardioverter/defibrillator) present  10/2004     explant; implant   .  Blood transfusion    .  Anemia    .  H/O hiatal hernia    .  Stroke      History of TIA and possible CVA; hospitalized 2004; on coumadin    Past Surgical History   Procedure  Date   .  Back surgery    .  Coronary angioplasty with stent placement  10/2001; 09/2002   .  Cardiac defibrillator  placement  07/2003; 10/2004   .  Lumbar laminectomy/decompression microdiscectomy  10/2001     L5-S1/E-chart   .  Knee arthroscopy      left   .  Thyroidectomy  1970's   .  Total knee arthroplasty  11/2003     left   .  X-stop implantation  12/2004     L3-4; L4-5   .  Fixation kyphoplasty thoracic spine  07/2007     T3, 4, 6 compression fractures   .  Shoulder open rotator cuff repair  04/2008     right   .  Lumbar laminectomy/decompression microdiscectomy  01/2010   .  Colectomy  03/2010     sigmoid left   .  Colostomy  03/2010     transverse   .  Peripherally inserted central catheter insertion  03/2010     removed upon discharge   .  Colostomy closure  12/2010     reversal   .  Tonsillectomy and adenoidectomy  1969   .  Appendectomy  03/2010   .  Cataract extraction      right    Family History   Problem  Relation  Age of Onset   .  Diabetes  Mother    .  Diabetes  Brother    .  Arthritis        family history    .  Prostate cancer        Family History   Social History: reports that she quit smoking about 10 years ago. Her smoking use included Cigarettes. She has a 3.3 pack-year smoking history. She has never used smokeless tobacco. She reports that she drinks alcohol. She reports that she does not use illicit drugs.  Allergies:  Allergies   Allergen  Reactions   .  Penicillins  Swelling     Throat swells   .  Prevacid  Nausea Only   .  Statins  Other (See Comments)     cramps    Medications Prior to Admission   Medication  Dose  Route  Frequency  Provider  Last Rate  Last Dose   .  acetaminophen (TYLENOL) tablet 325-650 mg  325-650 mg  Oral  Q4H PRN  Mcarthur Rossetti Angiulli, PA     .  amiodarone (PACERONE) tablet 200 mg  200 mg  Oral  Daily  Reuel Boom  J Angiulli, PA     .  aspirin EC tablet 81 mg  81 mg  Oral  Daily  Daniel J Angiulli, PA     .  busPIRone (BUSPAR) tablet 15 mg  15 mg  Oral  Daily PRN  Mcarthur Rossetti Angiulli, PA     .  calcium-vitamin D (OSCAL WITH D) 500-200  MG-UNIT per tablet 1 tablet  1 tablet  Oral  BID WC  Daniel J Angiulli, PA     .  carvedilol (COREG) tablet 12.5 mg  12.5 mg  Oral  BID WC  Daniel J Angiulli, PA     .  ezetimibe (ZETIA) tablet 10 mg  10 mg  Oral  Daily  Daniel J Angiulli, PA     .  fentaNYL (DURAGESIC - dosed mcg/hr) 12.5 mcg  12.5 mcg  Transdermal  Q72H  Daniel J Angiulli, PA     .  fluticasone (FLONASE) 50 MCG/ACT nasal spray 2 spray  2 spray  Each Nare  Daily PRN  Mcarthur Rossetti Angiulli, PA     .  folic acid-pyridoxine-cyancobalamin (FOLTX) 2.5-25-2 MG per tablet 1 tablet  1 tablet  Oral  Daily  Daniel J Angiulli, PA     .  furosemide (LASIX) tablet 20 mg  20 mg  Oral  Daily  Mcarthur Rossetti Angiulli, PA     .  HYDROcodone-acetaminophen (NORCO) 5-325 MG per tablet 2 tablet  2 tablet  Oral  Q4H PRN  Mcarthur Rossetti Angiulli, PA     .  levothyroxine (SYNTHROID, LEVOTHROID) tablet 150 mcg  150 mcg  Oral  Q0600  Mcarthur Rossetti Angiulli, PA     .  lisinopril (PRINIVIL,ZESTRIL) tablet 2.5 mg  2.5 mg  Oral  BID  Mcarthur Rossetti Angiulli, PA     .  loratadine (CLARITIN) tablet 10 mg  10 mg  Oral  Daily  Mcarthur Rossetti Angiulli, PA     .  methocarbamol (ROBAXIN) tablet 500 mg  500 mg  Oral  Q6H PRN  Mcarthur Rossetti Angiulli, PA   500 mg at 07/08/11 1622   .  nitroGLYCERIN (NITROSTAT) SL tablet 0.4 mg  0.4 mg  Sublingual  Q5 min PRN  Mcarthur Rossetti Angiulli, PA     .  omega-3 acid ethyl esters (LOVAZA) capsule 1,000 mg  1,000 mg  Oral  Q breakfast  Mcarthur Rossetti Angiulli, PA     .  ondansetron (ZOFRAN) tablet 4 mg  4 mg  Oral  Q6H PRN  Mcarthur Rossetti Angiulli, PA      Or   .  ondansetron (ZOFRAN) injection 4 mg  4 mg  Intravenous  Q6H PRN  Mcarthur Rossetti Angiulli, PA     .  pantoprazole (PROTONIX) EC tablet 40 mg  40 mg  Oral  BID  Mcarthur Rossetti Angiulli, PA     .  polyethylene glycol (MIRALAX / GLYCOLAX) packet 17 g  17 g  Oral  Daily  Daniel J Angiulli, PA     .  potassium chloride SA (K-DUR,KLOR-CON) CR tablet 20 mEq  20 mEq  Oral  Daily  Mcarthur Rossetti Angiulli, PA     .  raloxifene (EVISTA) tablet 60 mg  60  mg  Oral  Daily  Mcarthur Rossetti Angiulli, PA     .  senna (SENOKOT) tablet 17.2 mg  2 tablet  Oral  QHS  Daniel J Angiulli, PA     .  sodium chloride 0.9 % bolus 500 mL  500  mL  Intravenous  Once  Rodolph Bong, MD   500 mL at 07/08/11 1157   .  sorbitol 70 % solution 30 mL  30 mL  Oral  Daily PRN  Mcarthur Rossetti Angiulli, PA     .  warfarin (COUMADIN) tablet 1 mg  1 mg  Oral  ONCE-1800  Sorin Luanne Bras, MD   1 mg at 07/07/11 1731   .  Warfarin - Pharmacist Dosing Inpatient   Does not apply  q1800  Mcarthur Rossetti Angiulli, PA     .  DISCONTD: 0.9 % sodium chloride infusion  250 mL  Intravenous  PRN  Laveda Norman, MD     .  DISCONTD: acetaminophen (TYLENOL) suppository 650 mg  650 mg  Rectal  Q6H PRN  Laveda Norman, MD     .  DISCONTD: acetaminophen (TYLENOL) tablet 500 mg  500 mg  Oral  TID  Sorin Luanne Bras, MD   500 mg at 07/08/11 1058   .  DISCONTD: acetaminophen (TYLENOL) tablet 650 mg  650 mg  Oral  Q6H PRN  Laveda Norman, MD     .  DISCONTD: amiodarone (PACERONE) tablet 200 mg  200 mg  Oral  Daily  Laveda Norman, MD   200 mg at 07/08/11 1047   .  DISCONTD: aspirin EC tablet 81 mg  81 mg  Oral  Daily  Laveda Norman, MD   81 mg at 07/08/11 1047   .  DISCONTD: busPIRone (BUSPAR) tablet 15 mg  15 mg  Oral  Daily PRN  Laveda Norman, MD     .  DISCONTD: calcium-vitamin D (OSCAL WITH D) 500-200 MG-UNIT per tablet 1 tablet  1 tablet  Oral  BID WC  Laveda Norman, MD   1 tablet at 07/07/11 1730   .  DISCONTD: carvedilol (COREG) tablet 12.5 mg  12.5 mg  Oral  BID WC  Laveda Norman, MD   12.5 mg at 07/08/11 0831   .  DISCONTD: docusate sodium (COLACE) capsule 100 mg  100 mg  Oral  BID  Sorin Luanne Bras, MD   100 mg at 07/08/11 1047   .  DISCONTD: ezetimibe (ZETIA) tablet 10 mg  10 mg  Oral  Daily  Laveda Norman, MD   10 mg at 07/08/11 1047   .  DISCONTD: fentaNYL (DURAGESIC - dosed mcg/hr) 12.5 mcg  12.5 mcg  Transdermal  Q72H  Sorin Luanne Bras, MD   12.5 mcg at 07/07/11 1556   .  DISCONTD: fluticasone (FLONASE) 50 MCG/ACT nasal spray 2 spray  2  spray  Each Nare  Daily PRN  Laveda Norman, MD     .  DISCONTD: folic acid-pyridoxine-cyancobalamin (FOLTX) 2.5-25-2 MG per tablet 1 tablet  1 tablet  Oral  Daily  Laveda Norman, MD   1 tablet at 07/08/11 1046   .  DISCONTD: furosemide (LASIX) tablet 20 mg  20 mg  Oral  Daily  Laveda Norman, MD   20 mg at 07/08/11 1046   .  DISCONTD: HYDROcodone-acetaminophen (NORCO) 5-325 MG per tablet 2 tablet  2 tablet  Oral  Q4H PRN  Sorin Luanne Bras, MD   2 tablet at 07/08/11 0540   .  DISCONTD: HYDROmorphone (DILAUDID) injection 0.5 mg  0.5 mg  Intravenous  Q4H PRN  Laveda Norman, MD   0.5 mg at 07/07/11 1759   .  DISCONTD: levothyroxine (SYNTHROID, LEVOTHROID) tablet 150  mcg  150 mcg  Oral  Q0600  Laveda Norman, MD   150 mcg at 07/08/11 0533   .  DISCONTD: lisinopril (PRINIVIL,ZESTRIL) tablet 2.5 mg  2.5 mg  Oral  BID  Laveda Norman, MD   2.5 mg at 07/08/11 1046   .  DISCONTD: loratadine (CLARITIN) tablet 10 mg  10 mg  Oral  Daily  Laveda Norman, MD   10 mg at 07/08/11 1046   .  DISCONTD: methocarbamol (ROBAXIN) tablet 500 mg  500 mg  Oral  QID  Laveda Norman, MD   500 mg at 07/08/11 1435   .  DISCONTD: nitroGLYCERIN (NITROSTAT) SL tablet 0.4 mg  0.4 mg  Sublingual  Q5 min PRN  Laveda Norman, MD     .  DISCONTD: omega-3 acid ethyl esters (LOVAZA) capsule 1,000 mg  1,000 mg  Oral  Q breakfast  Laveda Norman, MD   1,000 mg at 07/08/11 0831   .  DISCONTD: ondansetron (ZOFRAN) injection 4 mg  4 mg  Intravenous  Q6H PRN  Laveda Norman, MD     .  DISCONTD: ondansetron (ZOFRAN) tablet 4 mg  4 mg  Oral  Q6H PRN  Laveda Norman, MD     .  DISCONTD: oxyCODONE (Oxy IR/ROXICODONE) immediate release tablet 5 mg  5 mg  Oral  QID  Laveda Norman, MD   5 mg at 07/08/11 1435   .  DISCONTD: pantoprazole (PROTONIX) EC tablet 40 mg  40 mg  Oral  BID  Laveda Norman, MD   40 mg at 07/08/11 1100   .  DISCONTD: polyethylene glycol (MIRALAX / GLYCOLAX) packet 17 g  17 g  Oral  Daily  Laveda Norman, MD   17 g at 07/08/11 1058   .  DISCONTD: potassium chloride SA  (K-DUR,KLOR-CON) CR tablet 20 mEq  20 mEq  Oral  Daily  Laveda Norman, MD   20 mEq at 07/08/11 1046   .  DISCONTD: promethazine (PHENERGAN) injection 12.5 mg  12.5 mg  Intravenous  Q6H PRN  Laveda Norman, MD   12.5 mg at 07/08/11 1445   .  DISCONTD: raloxifene (EVISTA) tablet 60 mg  60 mg  Oral  Daily  Sorin Luanne Bras, MD   60 mg at 07/08/11 1047   .  DISCONTD: senna (SENOKOT) tablet 17.2 mg  2 tablet  Oral  QHS  Sorin Luanne Bras, MD     .  DISCONTD: sodium chloride 0.9 % injection 3 mL  3 mL  Intravenous  Q12H  Laveda Norman, MD   3 mL at 07/08/11 1047   .  DISCONTD: sodium chloride 0.9 % injection 3 mL  3 mL  Intravenous  Q12H  Laveda Norman, MD   3 mL at 07/06/11 2149   .  DISCONTD: sodium chloride 0.9 % injection 3 mL  3 mL  Intravenous  PRN  Laveda Norman, MD     .  DISCONTD: warfarin (COUMADIN) tablet 3 mg  3 mg  Oral  q1800  Elonda Husky, PHARMD   3 mg at 07/06/11 1729   .  DISCONTD: Warfarin - Pharmacist Dosing Inpatient   Does not apply  q1800  Elonda Husky, PHARMD      Medications Prior to Admission   Medication  Sig  Dispense  Refill   .  amiodarone (PACERONE) 200 MG tablet  Take 200 mg by mouth daily.     [name]  aspirin 81 MG EC tablet  Take 81 mg by mouth daily.     .  busPIRone (BUSPAR) 15 MG tablet  Take 15 mg by mouth daily as needed. For mood stabilization.     .  carvedilol (COREG) 12.5 MG tablet  Take 12.5 mg by mouth 2 (two) times daily.     .  cyclobenzaprine (FLEXERIL) 5 MG tablet  Take 5 mg by mouth 2 (two) times daily as needed. For muscle spasms.     [name]  ezetimibe (ZETIA) 10 MG tablet  Take 10 mg by mouth daily.     .  fexofenadine (ALLEGRA) 180 MG tablet  Take 180 mg by mouth daily.     .  fish oil-omega-3 fatty acids 1000 MG capsule  Take 1,000 mg by mouth daily.     .  fluticasone (FLONASE) 50 MCG/ACT nasal spray  Place 2 sprays into the nose daily as needed. For allergies     .  folic acid-pyridoxine-cyancobalamin (FOLTX) 2.5-25-2 MG TABS  Take 1 tablet by mouth daily.     [name]   HYDROcodone-acetaminophen (NORCO) 5-325 MG per tablet  Take 1 tablet by mouth every 4 (four) hours as needed. For pain     .  levothyroxine (SYNTHROID, LEVOTHROID) 150 MCG tablet  Take 150 mcg by mouth daily.     .  methocarbamol (ROBAXIN) 500 MG tablet  Take 500 mg by mouth 4 (four) times daily as needed. For spasms     .  nitroGLYCERIN (NITROSTAT) 0.4 MG SL tablet  Place 0.4 mg under the tongue every 5 (five) minutes as needed. For chest pain     .  oxyCODONE-acetaminophen (PERCOCET) 5-325 MG per tablet  Take 1 tablet by mouth every 4 (four) hours as needed. For pain     .  pantoprazole (PROTONIX) 40 MG tablet  Take 40 mg by mouth 2 (two) times daily.     .  potassium chloride SA (K-DUR,KLOR-CON) 20 MEQ tablet  Take 20 mEq by mouth daily.     [name]  warfarin (COUMADIN) 3 MG tablet  Take 3 mg by mouth daily.     .  methocarbamol (ROBAXIN) 500 MG tablet  Take 500 mg by mouth every 6 (six) hours as needed. For muscle spasms     Home:  Home Living  Lives With: Alone  Available Help at Discharge: Family  Type of Home: House  Home Access: Stairs to enter  Secretary/administrator of Steps: 2  Entrance Stairs-Rails: Left  Home Layout: One level  Bathroom Shower/Tub: Tub/shower unit;Curtain  Bathroom Toilet: Handicapped height  Bathroom Accessibility: Yes  How Accessible: Accessible via walker  Home Adaptive Equipment: Bedside commode/3-in-1;Shower chair with back;Walker - rolling;Reacher  Functional History:  Prior Function  Level of Independence: Independent with homemaking with ambulation;Independent with transfers  Able to Take Stairs?: Yes  Driving: Yes  Vocation: Retired  Functional Status:  Mobility:  Bed Mobility  Bed Mobility: Rolling Right;Right Sidelying to Sit;Sit to Supine;Supine to Sit  Rolling Right: 4: Min assist  Left Sidelying to Sit: 4: Min assist  Supine to Sit: 4: Min assist  Sit to Supine: 3: Mod assist  Transfers  Sit to Stand: 3: Mod assist;From bed;With upper  extremity assist;Other (comment)  ADL:  Cognition:  Cognition  Overall Cognitive Status: Appears within functional limits for tasks assessed  Arousal/Alertness: Awake/alert  Orientation Level: Oriented X4  Attention: Alternating;Selective  Alternating Attention: Appears intact  Memory: Appears intact  Cognition  Arousal/Alertness: Awake/alert  Blood pressure 101/63, pulse 50, temperature 97.6 F (36.4 C), temperature source Oral, resp. rate 20, weight 76.068 kg (167 lb 11.2 oz), SpO2 98.00%.  Physical Exam  Vitals reviewed.  Constitutional: She is oriented to person, place, and time. She appears well-developed.  HENT:  Head: Normocephalic.  Neck: Normal range of motion. Neck supple. No thyromegaly present.  Cardiovascular: Regular rhythm.  Pulmonary/Chest: Breath sounds normal. She has no wheezes.  Abdominal: She exhibits no distension. There is no tenderness.  Musculoskeletal: She exhibits no edema.  Neurological: She is alert and oriented to person, place, and time.  Skin: Skin is warm and dry.  Psychiatric: She has a normal mood and affect.  motor strength is 4/5 in bilateral hip flexors knee extensors ankle dorsiflexors  Motor strength is 5/5 in bilateral deltoid, biceps, triceps, grip  Sensory exam shows decreased left L4, L5 and S1 dermatomal distribution sensory loss  Back has tenderness to palpation over the upper lumbar spinous processes  Results for orders placed during the hospital encounter of 07/05/11 (from the past 48 hour(s))   PROTIME-INR Status: Abnormal    Collection Time    07/07/11 5:00 AM   Component  Value  Range  Comment    Prothrombin Time  31.2 (*)  11.6 - 15.2 (seconds)     INR  2.95 (*)  0.00 - 1.49    PROTIME-INR Status: Abnormal    Collection Time    07/08/11 5:35 AM   Component  Value  Range  Comment    Prothrombin Time  33.2 (*)  11.6 - 15.2 (seconds)     INR  3.19 (*)  0.00 - 1.49    No results found.  Post Admission Physician Evaluation:    1. Functional deficits secondary to L2 compression fracture sustained during bending. 2. Patient is admitted to receive collaborative, interdisciplinary care between the physiatrist, rehab nursing staff, and therapy team. 3. Patient's level of medical complexity and substantial therapy needs in context of that medical necessity cannot be provided at a lesser intensity of care such as a SNF. 4. Patient has experienced substantial functional loss from his/her baseline which was documented above under the "Functional History" and "Functional Status" headings. Judging by the patient's diagnosis, physical exam, and functional history, the patient has potential for functional progress which will result in measurable gains while on inpatient rehab. These gains will be of substantial and practical use upon discharge in facilitating mobility and self-care at the household level. 5. Physiatrist will provide 24 hour management of medical needs as well as oversight of the therapy plan/treatment and provide guidance as appropriate regarding the interaction of the two. 6. 24 hour rehab nursing will assist with bowel management, safety, skin/wound care, disease management, medication administration, pain management and patient education and help integrate therapy concepts, techniques,education, etc. 7. PT will assess and treat for: Gait training, mobility, lower extremity strengthening, safety, osteoporosis education. Goals are: Modified independent with mobility. 8. OT will assess and treat for: ADLs, safety, endurance, osteoporosis spine education. Goals are: Modified independent ADLs. 9. SLP will assess and treat for: Not applicable. Goals are: Not applicable. 10. Case Management and Social Worker will assess and treat for psychological issues and discharge planning. 11. Team conference will be held weekly to assess progress toward goals and to determine barriers to discharge. 12. Patient will receive at least 3  hours of therapy per day at least 5 days per week. 13. ELOS and Prognosis: 7-10 days excellent Medical Problem List and  Plan:  1. Lumbar L2 compression fracture with osteoporosis. Conservative care with a back brace  2. DVT Prophylaxis/Anticoagulation: Chronic Coumadin therapy for history of left ventricular mural thrombus. Monitor for any signs of bleeding  3. Pain Management: Duragesic patch 12.5 mcg change every 72 hours and will titrate as needed.  Robaxin as needed. Vicodin as needed  4. Hypertension. Amiodarone 200 mg daily, Correa 12.5 mg twice a day, Lasix 20 mg daily, lisinopril 2.5 mg twice daily. Monitor with increased mobility  5. Hypothyroidism. Synthroid  Hyperlipidemia. zetia  6. Chronic renal insufficiency. Baseline creatinine 1.2-1.5. Strict I&O  7. Anxiety/depression. BuSpar as needed  8 diverticulitis. Protonix  9. Chronic left lumbosacral radiculopathy

## 2011-07-09 NOTE — Evaluation (Signed)
Physical Therapy Assessment and Plan  Patient Details  Name: [name] MRN: [id number] Date of Birth: 08/21/1950  PT Diagnosis: Abnormal posture, Difficulty walking, Muscle weakness and Pain in lower back Rehab Potential: Good ELOS: 7-10 days   Today's Date: 07/09/2011 Time: 4098-1191 Time Calculation (min): 57 min  Problem List:  Patient Active Problem List  Diagnoses  . HYPOTHYROIDISM  . HYPERLIPIDEMIA  . HYPERTENSION  . MYOCARDIAL INFARCTION, HX OF  . Chronic systolic heart failure  . OSTEOARTHRITIS  . Spinal stenosis  . Numbness of foot  . Ischemia, bowel  . Ischemic cardiomyopathy  . Chronic anticoagulation  . Stroke  . Arthritis  . CKD (chronic kidney disease)  . Tobacco abuse  . GERD (gastroesophageal reflux disease)  . LV (left ventricular) mural thrombus  . [employer] AICD  . Atrial fibrillation  . implantable cardiac defibrillator- Engineering geologist  . Anemia  . Unstable angina  . CAD (coronary artery disease)  . Unspecified disorder of urethra and urinary tract  . Carotid stenosis  . L2 vertebral fracture  . HTN (hypertension)  . PAF (paroxysmal atrial fibrillation)  . Dyslipidemia  . Compression fracture of L2    Past Medical History:  Past Medical History  Diagnosis Date  . Spinal stenosis   . Numbness of foot     left foot  . Diverticulitis     Status post partial colectomy 1/12 with reversal in July 2012  . Colitis, ischemic   . DJD (degenerative joint disease)     History of multiple surgeries to the back, shoulder and knee  . Ischemic cardiomyopathy     Echo 06/16/10: EF 25-30%, anteroseptal and apical hypokinesis, moderate AI, mild MR  . Chronic systolic heart failure   . CAD (coronary artery disease)     a.  Ant MI 8/03 with stenting of the LAD;  b. staged PCI with Cypher DES to OM1 8/03;  c. s/p Cypher DES to LAD 7/04;  d.  multiple cardiac caths in past (chronically abnl ECG);    e. cardiac catheterization  3/12: LAD stent patent, distal LAD 40-50%, small ostial D1 80%, ostial D2 60%, OM-1 stent patent (20-30%), proximal RCA 50%, proximal to mid RCA 40-50%; f. cath 02/17/2011 - nonobs  . LV (left ventricular) mural thrombus     Chronic Coumadin therapy  . CKD (chronic kidney disease), stage III     creatinine:  1.3 in 4/12;    . Tobacco abuse     history  . GERD (gastroesophageal reflux disease)   . Hypertension   . Lower GI bleed     August 2011  . Hypothyroidism   . Aortic insufficiency   . Pseudoaneurysm     History of, right forearm  . Hyperlipidemia   . OA (osteoarthritis)   . Abnormal EKG     Chronically abnormal EKG.   . Spinal stenosis   . Numbness of foot     Left foot  . Compression fracture 07/04/11    L2  . Anterior myocardial infarction 10/2001  . CHF (congestive heart failure)   . SVT (supraventricular tachycardia)     ? h/o AFib;  patient on long term amiodarone therapy  . PAF (paroxysmal atrial fibrillation)   . Anatomical narrow angle of right eye   . Heart murmur     "age 61-19"  . Shortness of breath     "at times; related to CHF"  . Shortness of breath on exertion   . ICD (implantable  cardiac defibrillator) in place 07/2003    followed by Dr. Graciela Husbands.   [name] AICD (automatic cardioverter/defibrillator) present 10/2004    explant; implant  . Blood transfusion   . Anemia   . H/O hiatal hernia   . Stroke     History of TIA and possible CVA; hospitalized 2004; on coumadin   Past Surgical History:  Past Surgical History  Procedure Date  . Back surgery   . Coronary angioplasty with stent placement 10/2001; 09/2002  . Cardiac defibrillator placement 07/2003; 10/2004  . Lumbar laminectomy/decompression microdiscectomy 10/2001    L5-S1/E-chart  . Knee arthroscopy     left  . Thyroidectomy 1970's  . Total knee arthroplasty 11/2003    left  . X-stop implantation 12/2004    L3-4; L4-5  . Fixation kyphoplasty thoracic spine 07/2007    T3, 4, 6 compression fractures  .  Shoulder open rotator cuff repair 04/2008    right  . Lumbar laminectomy/decompression microdiscectomy 01/2010  . Colectomy 03/2010    sigmoid left  . Colostomy 03/2010    transverse  . Peripherally inserted central catheter insertion 03/2010    removed upon discharge  . Colostomy closure 12/2010    reversal  . Tonsillectomy and adenoidectomy 1969  . Appendectomy 03/2010  . Cataract extraction     right    Assessment & Plan Clinical Impression: 61 year old right-handed African American female well-known rehabilitation services with history of multiple back surgeries. Last rehabilitation admission was in December of 2011 Admitted April 16 when she noted severe low back pain when bending over limiting her overall mobility. X-ray and imaging revealed mild lumbar L2 compression fracture. She was seen by neurosurgery advised conservative care. She is placed in a TLSO brace that she had from previous surgeries. Pain management ongoing with low-dose Duragesic patch added today. Patient remains on chronic Coumadin therapy for history left ventricular mural thrombus. Physical and occupational therapy evaluation completed recommendation physical medicine rehabilitation consult to consider inpatient rehabilitation services  Her other rehabilitation admissions were for left lower extremity radiculopathy related to spinal stenosis Patient transferred to CIR on 07/08/2011.   Patient currently requires min with mobility secondary to muscle weakness.  Prior to hospitalization, patient was Independent/Mod-Indepedent with mobility and lived with Alone in a House home.  Home access is 2Stairs to enter (2).  Patient will benefit from skilled PT intervention to maximize safe functional mobility, minimize fall risk and decrease caregiver burden for planned discharge home with intermittent assist.  Anticipate patient will benefit from follow up [facility] at discharge.  PT - End of Session Endurance Deficit: No (back spasms  require rest breaks) PT Assessment Rehab Potential: Good Barriers to Discharge: None PT Plan PT Frequency: 1-2 X/day, 60-90 minutes Estimated Length of Stay: 7-10 days PT Treatment/Interventions: Ambulation/gait training;Balance/vestibular training;Functional mobility training;Pain management;Patient/family education;Splinting/orthotics;Stair training;Therapeutic Activities;Therapeutic Exercise;UE/LE Strength taining/ROM PT Recommendation Follow Up Recommendations: Home health PT  PT Evaluation Precautions/Restrictions Precautions Precautions: Back Precaution Booklet Issued: No (already has booklet) Required Braces or Orthoses: Spinal Brace (TLSO) Restrictions Weight Bearing Restrictions: No General @[email or web address] ([phone]) Vital Signs See chart for details   Pain Pain Assessment Pain Assessment: 0-10 Pain Score:   4 Pain Type: Acute pain Pain Location: Back Pain Orientation: Lower Pain Descriptors: Aching Pain Onset: On-going Patients Stated Pain Goal: 2 Pain Intervention(s): Medication (See eMAR) Multiple Pain Sites: No Home Living/Prior Functioning Home Living Lives With: Alone Available Help at Discharge: Family (brother) Type of Home: House Home Access: Stairs to enter (2) Entrance Stairs-Rails: Left  Home Layout: One level Bathroom Shower/Tub: Engineer, manufacturing systems: Handicapped height Bathroom Accessibility: Yes How Accessible: Accessible via walker Home Adaptive Equipment: Bedside commode/3-in-1;Shower chair without back;Walker - rolling;Reacher Prior Function Level of Independence: Independent with homemaking with ambulation;Independent with transfers Able to Take Stairs?: Yes Driving: Yes Vocation: Retired Optometrist - History Baseline Vision: Wears glasses for distance only Patient Visual Report: No change from baseline  Cognition Overall Cognitive Status: Appears within functional limits for tasks assessed Orientation  Level: Oriented X4 Attention: Alternating;Selective Alternating Attention: Appears intact Memory: Appears intact Sensation Sensation Light Touch: Impaired Detail (Left foot/toes 1-3) Light Touch Impaired Details: Impaired LLE Proprioception: Impaired by gross assessment Coordination Gross Motor Movements are Fluid and Coordinated: Yes Fine Motor Movements are Fluid and Coordinated: Yes Motor  Motor Motor: Within Functional Limits  Mobility Bed Mobility Bed Mobility: Rolling Right;Right Sidelying to Sit;Sitting - Scoot to [place] of Bed Rolling Right: 4: Min assist Rolling Right Details: Verbal cues for precautions/safety (use of handrail) Rolling Right Details (indicate cue type and reason): cues for logrolling without twist and manual assist to push up to sitting Rolling Left Details (indicate cue type and reason): cues for logrolling without twist and manual assist to push up to sitting Right Sidelying to Sit: 4: Min assist (with siderail) Right Sidelying to Sit Details: Manual facilitation for weight shifting Sitting - Scoot to Edge of Bed: 4: Min assist Sitting - Scoot to [place] of Bed Details: Tactile cues for weight bearing Sit to Sidelying Right: 4: Min assist Sit to Sidelying Right Details: Tactile cues for placement Transfers Sit to Stand: 3: Mod assist Sit to Stand Details: Manual facilitation for weight shifting Stand to Sit: 4: Min assist Stand to Sit Details (indicate cue type and reason): Tactile cues for placement Stand to Sit Details: cues to reach back with hand(s) Locomotion  Ambulation Ambulation: Yes Ambulation/Gait Assistance: 4: Min assist Assistive device: Rolling walker Ambulation/Gait Assistance Details: Manual facilitation for weight shifting Gait Gait: Yes Gait Pattern: Step-to pattern;Decreased step length - right;Decreased step length - left;Shuffle Stairs / Additional Locomotion Stairs: No Corporate treasurer: No    Trunk/Postural Assessment  Cervical Assessment Cervical Assessment: Within Film/video editor Assessment: Within Functional Limits Lumbar Assessment Lumbar Assessment: Exceptions to WFL (TLSO placed for stability/control) Postural Control Postural Control: Deficits on evaluation (upright T- and L-spine with use of TLSO)  Balance Balance Balance Assessed: Yes Standardized Balance Assessment Standardized Balance Assessment:  (No Value. Patient has no LOB with static and dynamic standin) Static Standing Balance Static Standing - Balance Support: Bilateral upper extremity supported Static Standing - Level of Assistance: 5: Stand by assistance Extremity Assessment  RLE Assessment RLE Assessment: Within Functional Limits LLE Assessment LLE Assessment: Within Functional Limits  See FIM for current functional status Refer to Care Plan for Long Term Goals  Recommendations for other services: None  Discharge Criteria: Patient will be discharged from PT if patient refuses treatment 3 consecutive times without medical reason, if treatment goals not met, if there is a change in medical status, if patient makes no progress towards goals or if patient is discharged from hospital.  The above assessment, treatment plan, treatment alternatives and goals were discussed and mutually agreed upon: by patient  Rex Kras 07/09/2011, 1:02 PM

## 2011-07-10 LAB — PROTIME-INR
INR: 2.52 — ABNORMAL HIGH (ref 0.00–1.49)
Prothrombin Time: 27.6 seconds — ABNORMAL HIGH (ref 11.6–15.2)

## 2011-07-10 MED ORDER — METOCLOPRAMIDE HCL 5 MG PO TABS
5.0000 mg | ORAL_TABLET | Freq: Four times a day (QID) | ORAL | Status: DC | PRN
  Administered 2011-07-10 – 2011-07-12 (×3): 5 mg via ORAL
  Filled 2011-07-10 (×3): qty 1

## 2011-07-10 MED ORDER — CARVEDILOL 12.5 MG PO TABS
12.5000 mg | ORAL_TABLET | Freq: Two times a day (BID) | ORAL | Status: DC
  Filled 2011-07-10 (×3): qty 1

## 2011-07-10 MED ORDER — WARFARIN SODIUM 1 MG PO TABS
1.0000 mg | ORAL_TABLET | Freq: Once | ORAL | Status: AC
  Administered 2011-07-10: 1 mg via ORAL
  Filled 2011-07-10: qty 1

## 2011-07-10 MED ORDER — LEVOTHYROXINE SODIUM 175 MCG PO TABS
175.0000 ug | ORAL_TABLET | Freq: Every day | ORAL | Status: DC
  Administered 2011-07-11 – 2011-07-16 (×6): 175 ug via ORAL
  Filled 2011-07-10 (×7): qty 1

## 2011-07-10 NOTE — Progress Notes (Signed)
Reglan 5 mg po given to patient for nausea.

## 2011-07-10 NOTE — Progress Notes (Signed)
[name] is a 61 y.o. female  Subjective: No complaints, pain meds doing well.  Objective: Vital signs in last 24 hours: Temp:  [97.5 F (36.4 C)-97.6 F (36.4 C)] 97.5 F (36.4 C) (04/21 0622) Pulse Rate:  [44-99] 48  (04/21 0740) Resp:  [16-18] 16  (04/21 0740) BP: (95-106)/(49-66) 95/54 mmHg (04/21 0740) SpO2:  [92 %-98 %] 92 % (04/21 0622) Weight change:  Last BM Date: 07/10/11  Intake/Output from previous day: 04/20 0701 - 04/21 0700 In: 360 [P.O.:360] Out: 225 [Urine:225] Last cbgs: CBG (last 3)  No results found for this basename: GLUCAP:3 in the last 72 hours   Physical Exam General: No apparent distress    Lungs: Normal effort. Lungs clear to auscultation, no crackles or wheezes. Cardiovascular: Regular rate and rhythm, no edema Musculoskeletal:  Neurovascularly intact Neurological: No new neurological deficits Wounds: N/A      Lab Results: Results for orders placed during the hospital encounter of 07/08/11 (from the past 24 hour(s))  PROTIME-INR     Status: Abnormal   Collection Time   07/10/11  8:44 AM      Component Value Range   Prothrombin Time 27.6 (*) 11.6 - 15.2 (seconds)   INR 2.52 (*) 0.00 - 1.49      Studies/Results: No results found.  Medications: I have reviewed the patient's current medications.  Assessment: Functional deficits secondary to L2 compression fracture sustained during bending. Patient is admitted to receive collaborative, interdisciplinary care between the physiatrist, rehab nursing staff, and therapy team.  Plan: 1. Lumbar L2 compression fracture with osteoporosis. Conservative care with a back brace  2. DVT Prophylaxis/Anticoagulation: Chronic Coumadin therapy for history of left ventricular mural thrombus. Monitor for any signs of bleeding  3. Pain Management: Duragesic patch 12.5 mcg change every 72 hours, titrate as needed. Robaxin as needed. Vicodin as needed  4. Hypertension: borderline low - ?narc effect -Hold  coreg today. Lasix 20 mg daily, lisinopril 2.5 mg twice daily. Monitor with increased mobility  5. Hypothyroidism. Synthroid increased today to 175 mcg. Hyperlipidemia. zetia  6. Chronic renal insufficiency. Baseline creatinine 1.2-1.5. Strict I&O  7. Anxiety/depression. BuSpar as needed  8 diverticulitis. Protonix  9. Chronic left lumbosacral radiculopathy  10. Hyperlipidemia. zetia    Length of stay, days: 2  IRETON,SUSAN C , PA-C 07/10/2011, 11:47 AM  I agree with above.  Rene Paci, MD 07/10/2011, 11:47 AM

## 2011-07-10 NOTE — Progress Notes (Addendum)
Dr. Felicity Coyer notified of patient c/o nausea with vomiting undigested food; Zofran given at 1845 and Protonix given at 2013; patient has h/o GERD.  Order received:  Reglan 5 mg po q 6 hrs prn nausea.

## 2011-07-10 NOTE — Progress Notes (Signed)
ANTICOAGULATION CONSULT NOTE - Follow Up Consult  Pharmacy Consult for coumadin Indication: atrial fibrillation and h/o mural thrombus  Allergies  Allergen Reactions  . Penicillins Swelling    Throat swells  . Prevacid Nausea Only  . Statins Other (See Comments)    cramps    Patient Measurements: Weight: 167 lb 11.2 oz (76.068 kg) Heparin Dosing Weight:   Vital Signs: Temp: 97.5 F (36.4 C) (04/21 0622) Temp src: Oral (04/21 0622) BP: 95/54 mmHg (04/21 0740) Pulse Rate: 48  (04/21 0740)  Labs:  [name] 07/10/11 0844 07/09/11 0617 07/08/11 0535  HGB -- -- --  HCT -- -- --  PLT -- -- --  APTT -- -- --  LABPROT 27.6* 34.8* 33.2*  INR 2.52* 3.39* 3.19*  HEPARINUNFRC -- -- --  CREATININE -- -- --  CKTOTAL -- -- --  CKMB -- -- --  TROPONINI -- -- --   The CrCl is unknown because both a height and weight (above a minimum accepted value) are required for this calculation.   Medications:  Scheduled:     . amiodarone  200 mg Oral Daily  . aspirin EC  81 mg Oral Daily  . calcium-vitamin D  1 tablet Oral BID WC  . carvedilol  12.5 mg Oral BID WC  . ezetimibe  10 mg Oral Daily  . fentaNYL  12.5 mcg Transdermal Q72H  . folic acid-pyridoxine-cyancobalamin  1 tablet Oral Daily  . furosemide  20 mg Oral Daily  . levothyroxine  175 mcg Oral Q0600  . lisinopril  2.5 mg Oral BID  . loratadine  10 mg Oral Daily  . omega-3 acid ethyl esters  1,000 mg Oral Q breakfast  . pantoprazole  40 mg Oral BID  . polyethylene glycol  17 g Oral Daily  . potassium chloride SA  20 mEq Oral Daily  . raloxifene  60 mg Oral Daily  . senna  2 tablet Oral QHS  . Warfarin - Pharmacist Dosing Inpatient   Does not apply q1800  . DISCONTD: carvedilol  12.5 mg Oral BID WC  . DISCONTD: levothyroxine  150 mcg Oral Q0600    Assessment: [name] on coumadin for chronic Afib. INR is therapeutic this am at 2.52, no bleeding noted  Goal of Therapy:  INR 2-3   Plan:  - coumadin 1mg  tonight x 1 -  Daily INR  [name] 07/10/2011,12:23 PM

## 2011-07-10 NOTE — Progress Notes (Signed)
Patient vomited approximately 75 ml emesis with undigested food noted.  Patient states, "That is food that I ate last night for supper."

## 2011-07-11 DIAGNOSIS — Z5189 Encounter for other specified aftercare: Secondary | ICD-10-CM

## 2011-07-11 DIAGNOSIS — S22009A Unspecified fracture of unspecified thoracic vertebra, initial encounter for closed fracture: Secondary | ICD-10-CM

## 2011-07-11 LAB — COMPREHENSIVE METABOLIC PANEL
ALT: 16 U/L (ref 0–35)
Albumin: 3.6 g/dL (ref 3.5–5.2)
Calcium: 9.7 mg/dL (ref 8.4–10.5)
GFR calc Af Amer: 48 mL/min — ABNORMAL LOW (ref >90)
Glucose, Bld: 112 mg/dL — ABNORMAL HIGH (ref 70–99)
Sodium: 132 mEq/L — ABNORMAL LOW (ref 135–145)
Total Protein: 6.7 g/dL (ref 6.0–8.3)

## 2011-07-11 LAB — DIFFERENTIAL
Eosinophils Relative: 1 % (ref 0–5)
Lymphocytes Relative: 12 % (ref 12–46)
Lymphs Abs: 0.8 10*3/uL (ref 0.7–4.0)
Monocytes Relative: 9 % (ref 3–12)
Neutrophils Relative %: 78 % — ABNORMAL HIGH (ref 43–77)

## 2011-07-11 LAB — CBC
Hemoglobin: 12.5 g/dL (ref 12.0–15.0)
MCH: 33.5 pg (ref 26.0–34.0)
MCHC: 34.3 g/dL (ref 30.0–36.0)
RDW: 15.6 % — ABNORMAL HIGH (ref 11.5–15.5)

## 2011-07-11 LAB — PROTIME-INR
INR: 2.05 — ABNORMAL HIGH (ref 0.00–1.49)
Prothrombin Time: 23.5 seconds — ABNORMAL HIGH (ref 11.6–15.2)

## 2011-07-11 MED ORDER — CARVEDILOL 6.25 MG PO TABS
6.2500 mg | ORAL_TABLET | Freq: Two times a day (BID) | ORAL | Status: DC
  Administered 2011-07-11 – 2011-07-12 (×3): 6.25 mg via ORAL
  Filled 2011-07-11 (×7): qty 1

## 2011-07-11 MED ORDER — WARFARIN SODIUM 2 MG PO TABS
2.0000 mg | ORAL_TABLET | Freq: Once | ORAL | Status: AC
  Administered 2011-07-11: 2 mg via ORAL
  Filled 2011-07-11: qty 1

## 2011-07-11 MED ORDER — PROMETHAZINE HCL 12.5 MG PO TABS: 25.0000 mg | ORAL_TABLET | ORAL | Status: DC | PRN

## 2011-07-11 NOTE — Progress Notes (Signed)
Physical Therapy Note  Patient Details  Name: MARKELA WEE MRN: [id number] Date of Birth: [birth year] Today's Date: 07/11/2011  1300-1355 (55 minutes) individual Pain: 6/10 low back/premedicated Precautions: TLSO when up Focus of treatment: Bed mobility training observing back precautions; bilateral LE strengthening Treatment: wc mobility SBA unit 120 feet level; transfers- stand/pivot with RW min assist; Pt reports increased pain fro Rt sidelying ; LT sit to side to supine with vcs to prevent trunk rotation; sidelying hip flexion/extension and knee flexion and extension X 20; left side to sit SBA with difficulty but less pain than from right; sit to stand X 3 from mat min assist kwith increased c/o pain;returned to sidelying .   Reeta Kuk,JIM 07/11/2011, 1:34 PM

## 2011-07-11 NOTE — Progress Notes (Signed)
Physical Therapy Note  Patient Details  Name: [name] MRN: [id number] Date of Birth: Jul 30, 1950 Today's Date: 07/11/2011  1455-1535  (40 minutes) individual Pain: 8/10 low back /premedicated Focus of treatment: Bed mobility training observing back precautions; gait training /endurance with RW Treatment: Bed mobility- supine to side to sit with bedrails SBA with multiple attempts ; max assist to don TLSO; transfer -SPT with RW min assist; Gait 60 feet X 1 RW min assist;pt reports she is unable to attempt second gait secondary to pain; standing tolerance with RW min assist X 3 minutes.   Debbie Yearick,JIM 07/11/2011, 3:15 PM

## 2011-07-11 NOTE — Progress Notes (Signed)
Patient ID: [name], female   DOB: 1951-02-02, 61 y.o.   MRN: [id number]  Subjective/Complaints: Nauseated, constipated, vomited this am but feels better now.  Mild abd pain "only because I vomited"  Review of Systems  Gastrointestinal: Positive for nausea, vomiting and abdominal pain.  All other systems reviewed and are negative.     Objective: Vital Signs: Blood pressure 97/55, pulse 58, temperature 97.9 F (36.6 C), temperature source Oral, resp. rate 18, weight 76.068 kg (167 lb 11.2 oz), SpO2 97.00%. No results found. Results for orders placed during the hospital encounter of 07/08/11 (from the past 72 hour(s))  PROTIME-INR     Status: Abnormal   Collection Time   07/09/11  6:17 AM      Component Value Range Comment   Prothrombin Time 34.8 (*) 11.6 - 15.2 (seconds)    INR 3.39 (*) 0.00 - 1.49    PROTIME-INR     Status: Abnormal   Collection Time   07/10/11  8:44 AM      Component Value Range Comment   Prothrombin Time 27.6 (*) 11.6 - 15.2 (seconds)    INR 2.52 (*) 0.00 - 1.49    CBC     Status: Abnormal   Collection Time   07/11/11  7:00 AM      Component Value Range Comment   WBC 7.1  4.0 - 10.5 (K/uL)    RBC 3.73 (*) 3.87 - 5.11 (MIL/uL)    Hemoglobin 12.5  12.0 - 15.0 (g/dL)    HCT 78.2  95.6 - 21.3 (%)    MCV 97.6  78.0 - 100.0 (fL)    MCH 33.5  26.0 - 34.0 (pg)    MCHC 34.3  30.0 - 36.0 (g/dL)    RDW 08.6 (*) 57.8 - 15.5 (%)    Platelets 159  150 - 400 (K/uL)   COMPREHENSIVE METABOLIC PANEL     Status: Abnormal   Collection Time   07/11/11  7:00 AM      Component Value Range Comment   Sodium 132 (*) 135 - 145 (mEq/L)    Potassium 4.9  3.5 - 5.1 (mEq/L)    Chloride 94 (*) 96 - 112 (mEq/L)    CO2 28  19 - 32 (mEq/L)    Glucose, Bld 112 (*) 70 - 99 (mg/dL)    BUN 19  6 - 23 (mg/dL)    Creatinine, Ser 4.69 (*) 0.50 - 1.10 (mg/dL)    Calcium 9.7  8.4 - 10.5 (mg/dL)    Total Protein 6.7  6.0 - 8.3 (g/dL)    Albumin 3.6  3.5 - 5.2 (g/dL)    AST 27  0 - 37  (U/L) HEMOLYSIS AT THIS LEVEL MAY AFFECT RESULT   ALT 16  0 - 35 (U/L)    Alkaline Phosphatase 45  39 - 117 (U/L)    Total Bilirubin 0.5  0.3 - 1.2 (mg/dL)    GFR calc non Af Amer 41 (*) >90 (mL/min)    GFR calc Af Amer 48 (*) >90 (mL/min)   DIFFERENTIAL     Status: Abnormal   Collection Time   07/11/11  7:00 AM      Component Value Range Comment   Neutrophils Relative 78 (*) 43 - 77 (%)    Neutro Abs 5.5  1.7 - 7.7 (K/uL)    Lymphocytes Relative 12  12 - 46 (%)    Lymphs Abs 0.8  0.7 - 4.0 (K/uL)    Monocytes Relative 9  3 - 12 (%)    Monocytes Absolute 0.6  0.1 - 1.0 (K/uL)    Eosinophils Relative 1  0 - 5 (%)    Eosinophils Absolute 0.0  0.0 - 0.7 (K/uL)    Basophils Relative 0  0 - 1 (%)    Basophils Absolute 0.0  0.0 - 0.1 (K/uL)   PROTIME-INR     Status: Abnormal   Collection Time   07/11/11  7:00 AM      Component Value Range Comment   Prothrombin Time 23.5 (*) 11.6 - 15.2 (seconds)    INR 2.05 (*) 0.00 - 1.49      GEN; NAD Mood/affect anxious Lungs Clear Cor:  Bradycardia, no murmur Abd:  +BS, soft, mild diffuse tenderness Ext: no C/C/E Motor 4/5 in BUE and BLE     Assessment/Plan: 1. Functional deficits secondary to Deconditioning which require 3+ hours per day of interdisciplinary therapy in a comprehensive inpatient rehab setting. Physiatrist is providing close team supervision and 24 hour management of active medical problems listed below. Physiatrist and rehab team continue to assess barriers to discharge/monitor patient progress toward functional and medical goals. FIM: FIM - Bathing Bathing Steps Patient Completed: Chest Bathing: 5: Set-up assist to: Obtain items  FIM - Upper Body Dressing/Undressing Upper body dressing/undressing: 0: Wears gown/pajamas-no public clothing FIM - Lower Body Dressing/Undressing Lower body dressing/undressing: 0: Wears gown/pajamas-no public clothing  FIM - Toileting Toileting steps completed by patient: Adjust clothing  prior to toileting;Performs perineal hygiene;Adjust clothing after toileting Toileting Assistive Devices: Grab bar or rail for support Toileting: 2: Max-Patient completed 1 of 3 steps  FIM - Diplomatic Services operational officer Devices: Grab bars;Walker Toilet Transfers: 4-To toilet/BSC: Min A (steadying Pt. > 75%);5-From toilet/BSC: Supervision (verbal cues/safety issues)  FIM - Banker Devices: Therapist, occupational: 4: Supine > Sit: Min A (steadying Pt. > 75%/lift 1 leg);4: Sit > Supine: Min A (steadying pt. > 75%/lift 1 leg);4: Bed > Chair or W/C: Min A (steadying Pt. > 75%);4: Chair or W/C > Bed: Min A (steadying Pt. > 75%)  FIM - Locomotion: Wheelchair Locomotion: Wheelchair: 0: Activity did not occur FIM - Locomotion: Ambulation Locomotion: Ambulation Assistive Devices: Designer, industrial/product Ambulation/Gait Assistance: 4: Min assist Locomotion: Ambulation: 1: Travels less than 50 ft with minimal assistance (Pt.>75%)  Comprehension Comprehension Mode: Auditory Comprehension: 5-Understands complex 90% of the time/Cues < 10% of the time  Expression Expression Mode: Verbal Expression: 5-Expresses basic needs/ideas: With no assist  Social Interaction Social Interaction: 6-Interacts appropriately with others with medication or extra time (anti-anxiety, antidepressant).  Problem Solving Problem Solving: 4-Solves basic 75 - 89% of the time/requires cueing 10 - 24% of the time  Memory Memory: 6-More than reasonable amt of time  2. Anticoagulation/DVT prophylaxis with Pharmaceutical: Coumadin Medical Problem List and Plan:  1. Lumbar L2 compression fracture with osteoporosis. Conservative care with a back brace  2. DVT Prophylaxis/Anticoagulation: Chronic Coumadin therapy for history of left ventricular mural thrombus. Monitor for any signs of bleeding  3. Pain Management: Duragesic patch 12.5 mcg change every 72 hours and will  titrate as needed.  Robaxin as needed. Vicodin as needed  4. Hypertension. Amiodarone 200 mg daily, Correa 12.5 mg twice a day, Lasix 20 mg daily, lisinopril 2.5 mg twice daily. Monitor with increased mobility  5. Hypothyroidism. Synthroid  Hyperlipidemia. zetia  6. Chronic renal insufficiency. Baseline creatinine 1.2-1.5. Strict I&O  7. Anxiety/depression. BuSpar as needed  8 diverticulitis. Protonix  9.  Chronic left lumbosacral radiculopathy 10.  Bradycardia due to beta blocker will reduce dose and monitor HR 11.  Nasuea- likely med related labs look good this am LOS (Days) 3 A FACE TO FACE EVALUATION WAS PERFORMED  Lathen Seal E 07/11/2011, 9:56 AM

## 2011-07-11 NOTE — Progress Notes (Signed)
Social Work Assessment and Plan Social Work Assessment and Plan  Patient Details  Name: [name] MRN: [id number] Date of Birth: 02-Jul-1950  Today's Date: 07/11/2011  Problem List:  Patient Active Problem List  Diagnoses  . HYPOTHYROIDISM  . HYPERLIPIDEMIA  . HYPERTENSION  . MYOCARDIAL INFARCTION, HX OF  . Chronic systolic heart failure  . OSTEOARTHRITIS  . Spinal stenosis  . Numbness of foot  . Ischemia, bowel  . Ischemic cardiomyopathy  . Chronic anticoagulation  . Stroke  . Arthritis  . CKD (chronic kidney disease)  . Tobacco abuse  . GERD (gastroesophageal reflux disease)  . LV (left ventricular) mural thrombus  . [employer] AICD  . Atrial fibrillation  . implantable cardiac defibrillator- Engineering geologist  . Anemia  . Unstable angina  . CAD (coronary artery disease)  . Unspecified disorder of urethra and urinary tract  . Carotid stenosis  . L2 vertebral fracture  . HTN (hypertension)  . PAF (paroxysmal atrial fibrillation)  . Dyslipidemia  . Compression fracture of L2   Past Medical History:  Past Medical History  Diagnosis Date  . Spinal stenosis   . Numbness of foot     left foot  . Diverticulitis     Status post partial colectomy 1/12 with reversal in July 2012  . Colitis, ischemic   . DJD (degenerative joint disease)     History of multiple surgeries to the back, shoulder and knee  . Ischemic cardiomyopathy     Echo 06/16/10: EF 25-30%, anteroseptal and apical hypokinesis, moderate AI, mild MR  . Chronic systolic heart failure   . CAD (coronary artery disease)     a.  Ant MI 8/03 with stenting of the LAD;  b. staged PCI with Cypher DES to OM1 8/03;  c. s/p Cypher DES to LAD 7/04;  d.  multiple cardiac caths in past (chronically abnl ECG);    e. cardiac catheterization 3/12: LAD stent patent, distal LAD 40-50%, small ostial D1 80%, ostial D2 60%, OM-1 stent patent (20-30%), proximal RCA 50%, proximal to mid RCA 40-50%; f. cath  02/17/2011 - nonobs  . LV (left ventricular) mural thrombus     Chronic Coumadin therapy  . CKD (chronic kidney disease), stage III     creatinine:  1.3 in 4/12;    . Tobacco abuse     history  . GERD (gastroesophageal reflux disease)   . Hypertension   . Lower GI bleed     August 2011  . Hypothyroidism   . Aortic insufficiency   . Pseudoaneurysm     History of, right forearm  . Hyperlipidemia   . OA (osteoarthritis)   . Abnormal EKG     Chronically abnormal EKG.   . Spinal stenosis   . Numbness of foot     Left foot  . Compression fracture 07/04/11    L2  . Anterior myocardial infarction 10/2001  . CHF (congestive heart failure)   . SVT (supraventricular tachycardia)     ? h/o AFib;  patient on long term amiodarone therapy  . PAF (paroxysmal atrial fibrillation)   . Anatomical narrow angle of right eye   . Heart murmur     "age 61-19"  . Shortness of breath     "at times; related to CHF"  . Shortness of breath on exertion   . 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 Graciela Husbands.   [name] AICD (automatic cardioverter/defibrillator) present 10/2004    explant;  implant  . Blood transfusion   . Anemia   . H/O hiatal hernia   . Stroke     History of TIA and possible CVA; hospitalized 2004; on coumadin   Past Surgical History:  Past Surgical History  Procedure Date  . Back surgery   . Coronary angioplasty with stent placement 10/2001; 09/2002  . Cardiac defibrillator placement 07/2003; 10/2004  . Lumbar laminectomy/decompression microdiscectomy 10/2001    L5-S1/E-chart  . Knee arthroscopy     left  . Thyroidectomy 1970's  . Total knee arthroplasty 11/2003    left  . X-stop implantation 12/2004    L3-4; L4-5  . Fixation kyphoplasty thoracic spine 07/2007    T3, 4, 6 compression fractures  . Shoulder open rotator cuff repair 04/2008    right  . Lumbar laminectomy/decompression microdiscectomy 01/2010  . Colectomy 03/2010    sigmoid left  .  Colostomy 03/2010    transverse  . Peripherally inserted central catheter insertion 03/2010    removed upon discharge  . Colostomy closure 12/2010    reversal  . Tonsillectomy and adenoidectomy 1969  . Appendectomy 03/2010  . Cataract extraction     right   Social History:  reports that she quit smoking about 10 years ago. Her smoking use included Cigarettes. She has a 3.3 pack-year smoking history. She has never used smokeless tobacco. She reports that she drinks alcohol. She reports that she does not use illicit drugs.  Family / Support Systems Marital Status: Single Patient Roles: Other (Comment) (Sibling) Other Supports: [place]-brother  [phone] Anticipated Caregiver: Brother can check daily on pt Ability/Limitations of Caregiver: Intermittent  Caregiver Availability: Intermittent Family Dynamics: Pt is close to her brother, they have always helped one another.  She has friends who also come by and check on her.  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  She has been through this before and knows the routine.  She reprots she is confident she will get back to caring for herself, knows what to do with this brace. Recent Psychosocial Issues: Other medical issues Pyschiatric History: No history- deferred Depression Screen-pt felt not necessary and looks to be coping appropriately with this hosptialization.  Will continue to monitor her coping.  Relieved feeling  better today, not throwing up. Substance Abuse History: History of smoking plans to cut back.  Aware of the risks to her health.  Patient / Family Perceptions, Expectations & Goals Pt/Family understanding of illness & functional limitations: Pt is able to explain her back issues and the conservative care.  The brace she has on is from her last surgery in 2012.  She reports she almost threw it away but glad she didn't.  She feels better already. Premorbid pt/family roles/activities: Sister, Retiree, Mining engineer, Chief Financial Officer, etc Anticipated changes in roles/activities/participation: Plans to resume Pt/family expectations/goals: Pt states: " I want to be able to care for myself, I have too no one can be there with me."  She has always done well here and feels she will again.  Community [employer] Agencies: None Premorbid Home Care/DME Agencies: None  Transportation available at discharge: Family  Discharge Planning Living Arrangements: Alone Support Systems: Other relatives;Friends/neighbors;Church/faith community Type of Residence: Private residence Insurance Resources: Medicare;Private Insurance (specify) Transport planner) Financial Resources: SSD Financial Screen Referred: No Living Expenses: Own Money Management: Patient Do you have any problems obtaining your medications?: No Home Management: Pt  Patient/Family Preliminary Plans: return home with brother coming by to check on her along with her friends.  She will be alone though. Social Work Anticipated Follow Up Needs: HH/OP DC Planning Additional Notes/Comments: This is pt's third rehab stay and knows what to expect.  Clinical Impression Pleasant woman is bright and cheerful.  Relieved feeling much better than yesterday.  Supportive brother who will do what he can.  Pt should do well here, motivated and knows what she needs to accomplish  To go home.  Lucy Chris 07/11/2011, 1:15 PM

## 2011-07-11 NOTE — Progress Notes (Signed)
Dr. Felicity Coyer notified of patient continuing to c/o nausea with dry heaves; patient without fever.  Order received:  Phenergan 25 mg po q 4 hrs prn vomiting.

## 2011-07-11 NOTE — Progress Notes (Signed)
Per State Regulation 482.30 This chart was reviewed for medical necessity with respect to the patient's Admission/Duration of stay. [name]                 Nurse Care Manager            Next Review Date: 07/14/11

## 2011-07-11 NOTE — Progress Notes (Signed)
[facility]patient [facility] Individual Statement of Services  Patient Name:  [name]  Date:  07/11/2011  Welcome to the [facility]patient [facility].  Our goal is to provide you with an individualized program based on your diagnosis and situation, designed to meet your specific needs.  With this comprehensive rehabilitation program, you will be expected to participate in at least 3 hours of rehabilitation therapies Monday-Friday, with modified therapy programming on the weekends.  Your rehabilitation program will include the following services:  Physical Therapy (PT), Occupational Therapy (OT), 24 hour per day rehabilitation nursing, Therapeutic Recreaction (TR), Case Management (RN and Child psychotherapist), Rehabilitation Medicine, Nutrition Services and Pharmacy Services  Weekly team conferences will be held on Wednesdays to discuss your progress.  Your RN Case Designer, television/film set will talk with you frequently to get your input and to update you on team discussions.  Team conferences with you and your family in attendance may also be held.  Expected length of stay: 7-10 days  Overall anticipated outcome: Modified Independent  Depending on your progress and recovery, your program may change.  Your RN Case Estate agent will coordinate services and will keep you informed of any changes.  Your RN Sports coach and SW names and contact numbers are listed  below.  The following services may also be recommended but are not provided by the Inpatient Rehabilitation Center:   Driving Evaluations  Home Health Rehabiltiation Services  Outpatient Rehabilitatation [facility]  Vocational Rehabilitation   Arrangements will be made to provide these services after discharge if needed.  Arrangements include referral to agencies that provide these services.  Your insurance has been verified to be:  Medicare + Monia Pouch Your primary doctor is:  Dr Sanda Linger  Pertinent information  will be shared with your doctor and your insurance company.  Case Manager: Lutricia Horsfall, [facility] [phone]  Social Worker:  Dossie Der, [place] [phone]  Information discussed with and copy given to patient by: [name], 07/11/2011

## 2011-07-11 NOTE — Progress Notes (Signed)
ANTICOAGULATION CONSULT NOTE - Follow Up Consult  Pharmacy Consult for coumadin Indication: atrial fibrillation and h/o mural thrombus  Allergies  Allergen Reactions  . Penicillins Swelling    Throat swells  . Prevacid Nausea Only  . Statins Other (See Comments)    cramps    Patient Measurements: Weight: 167 lb 11.2 oz (76.068 kg) Heparin Dosing Weight:   Vital Signs: Temp: 97.9 F (36.6 C) (04/22 0613) Temp src: Oral (04/22 0613) BP: 97/55 mmHg (04/22 8413) Pulse Rate: 58  (04/22 0800)  Labs:  Basename 07/11/11 0700 07/10/11 0844 07/09/11 0617  HGB 12.5 -- --  HCT 36.4 -- --  PLT 159 -- --  APTT -- -- --  LABPROT 23.5* 27.6* 34.8*  INR 2.05* 2.52* 3.39*  HEPARINUNFRC -- -- --  CREATININE 1.35* -- --  CKTOTAL -- -- --  CKMB -- -- --  TROPONINI -- -- --   The CrCl is unknown because both a height and weight (above a minimum accepted value) are required for this calculation.   Medications:  Scheduled:     . amiodarone  200 mg Oral Daily  . aspirin EC  81 mg Oral Daily  . calcium-vitamin D  1 tablet Oral BID WC  . carvedilol  6.25 mg Oral BID WC  . ezetimibe  10 mg Oral Daily  . fentaNYL  12.5 mcg Transdermal Q72H  . folic acid-pyridoxine-cyancobalamin  1 tablet Oral Daily  . furosemide  20 mg Oral Daily  . levothyroxine  175 mcg Oral Q0600  . lisinopril  2.5 mg Oral BID  . loratadine  10 mg Oral Daily  . omega-3 acid ethyl esters  1,000 mg Oral Q breakfast  . pantoprazole  40 mg Oral BID  . polyethylene glycol  17 g Oral Daily  . potassium chloride SA  20 mEq Oral Daily  . raloxifene  60 mg Oral Daily  . senna  2 tablet Oral QHS  . warfarin  1 mg Oral ONCE-1800  . Warfarin - Pharmacist Dosing Inpatient   Does not apply q1800  . DISCONTD: carvedilol  12.5 mg Oral BID WC  . DISCONTD: carvedilol  12.5 mg Oral BID WC  . DISCONTD: levothyroxine  150 mcg Oral Q0600    Assessment: [name] on coumadin for chronic Afib. INR decreased to 2.05 today.  Goal  of Therapy:  INR 2-3   Plan:  - coumadin 2mg  tonight x 1 - Daily INR  [name] 07/11/2011,10:30 AM

## 2011-07-11 NOTE — Progress Notes (Signed)
Occupational Therapy Session Note  Patient Details  Name: [name] MRN: [id number] Date of Birth: 1950-07-04  Today's Date: 07/11/2011 Time: 1005-1130 Time Calculation (min): 85 min  Short Term Goals: Week 1:  OT Short Term Goal 1 (Week 1): STG=LTG  Skilled Therapeutic Interventions/Progress Updates:   Pt seen for BADL retraining of toileting, bathing, and dressing with a focus on standing tolerance and maintaining back precautions.  Pt maintains precautions well except for sitting on EOB.  She leans forward to relieve her back pressure and it is very difficult to convince her she should not do that.  Otherwise, she completed UB adls with supervision and LB adls with mod assist.  Supervision with toilet transfers.  Pt needed numerous rest breaks due to fatigue and nausea.     Therapy Documentation Precautions:  Precautions Precautions: Back Precaution Booklet Issued: No (already has booklet) Precaution Comments: Reviewed back precautions Required Braces or Orthoses: Spinal Brace (TLSO) Spinal Brace: Thoracolumbosacral orthotic;Applied in sitting position Restrictions Weight Bearing Restrictions: No Pain: Pain Assessment Pain Assessment: 0-10 Pain Score:   5 Pain Type: Acute pain Pain Location: Back Pain Orientation: Left Pain Descriptors: Aching Pain Onset: On-going Patients Stated Pain Goal: 1 Pain Intervention(s): RN made aware (RN provided medication) See FIM for current functional status  Therapy/Group: Individual Therapy  Kipton Skillen 07/11/2011, 12:00 PM

## 2011-07-11 NOTE — Progress Notes (Signed)
Recreational Therapy Assessment and Plan  Patient Details  Name: [name] MRN: [id number] Date of Birth: 10/23/1950 Today's Date: 07/11/2011  Rehab Potential: Good ELOS: 10-12 days   Assessment Clinical Impression: 61 year old right-handed African American female well-known rehabilitation services with history of multiple back surgeries. Last rehabilitation admission was in December of 2011 Admitted April 16 when she noted severe low back pain when bending over limiting her overall mobility. X-ray and imaging revealed mild lumbar L2 compression fracture. She was seen by neurosurgery advised conservative care. She is placed in a TLSO brace that she had from previous surgeries. Pain management ongoing with low-dose Duragesic patch added today. Patient remains on chronic Coumadin therapy for history left ventricular mural thrombus. Physical and occupational therapy evaluation completed recommendation physical medicine rehabilitation consult to consider inpatient rehabilitation services Her other rehabilitation admissions were for left lower extremity radiculopathy related to spinal stenosis.  Pt presents with decreased activity tolerance, decreased functional mobility, decreased balance limiting pt's independence with leisure/community pursuits.   Leisure History/Participation Premorbid leisure interest/current participation: Garment/textile technologist - Travel (Comment);Sports - Other (Comment);Community - Press photographer - Physicist, medical (bowling, suduko\) Other Leisure Interests: Television;Videogames (wii) Leisure Participation Style: With Family/Friends Awareness of Community Resources: Excellent Psychosocial / Spiritual Spiritual Interests: Church Patient agreeable to Pet Therapy: Yes Does patient have pets?: No Social interaction - Mood/Behavior: Cooperative Film/video editor for Education?: Yes Strengths/Weaknesses Patient Strengths/Abilities: Willingness to  participate;Active premorbidly Patient weaknesses: Physical limitations  Plan Rec Therapy Plan Is patient appropriate for Therapeutic Recreation?: Yes Rehab Potential: Good Treatment times per week: Min 1 time per week . 20 minutes Estimated Length of Stay: 10-12 days TR Treatment/Interventions: Adaptive equipment instruction;1:1 session;Balance/vestibular training;Community reintegration;Functional mobility training;Patient/family education;Therapeutic activities;Recreation/leisure participation  Recommendations for other services: None  Discharge Criteria: Patient will be discharged from TR if patient refuses treatment 3 consecutive times without medical reason.  If treatment goals not met, if there is a change in medical status, if patient makes no progress towards goals or if patient is discharged from hospital.  The above assessment, treatment plan, treatment alternatives and goals were discussed and mutually agreed upon: by patient  Falicia Lizotte 07/11/2011, 2:19 PM

## 2011-07-12 LAB — PROTIME-INR: INR: 1.69 — ABNORMAL HIGH (ref 0.00–1.49)

## 2011-07-12 MED ORDER — WARFARIN SODIUM 3 MG PO TABS
3.0000 mg | ORAL_TABLET | Freq: Once | ORAL | Status: AC
Start: 2011-07-12 — End: 2011-07-12
  Administered 2011-07-12: 3 mg via ORAL
  Filled 2011-07-12: qty 1

## 2011-07-12 NOTE — Progress Notes (Signed)
Physical Therapy Session Note  Patient Details  Name: [name] MRN: [id number] Date of Birth: 11-13-1950  Today's Date: 07/12/2011 Time: 0805-0900 Time Calculation (min): 55 min  Short Term Goals: Week 1:  PT Short Term Goal 1 (Week 1): Patient will be able to perform bed mobility with Mod-I assist. PT Short Term Goal 2 (Week 1): Patient will be able to perform transfers from bed to chair with Mod-I assist. PT Short Term Goal 3 (Week 1): Patient will be able to perform car transfers with Mod-I assist. PT Short Term Goal 4 (Week 1): Patient will ambulate using LRAD x 50' in home environment with Mod-I assist. PT Short Term Goal 5 (Week 1): Patient will be able to ascend/descend 2 steps with Left handrail with Mod-I assist.  Skilled Therapeutic Interventions/Progress Updates: Funtional mobility and therapeutic exercise performed with LE to increase strength for functional mobility. Gait training with RW x 125 with min assist for safey, focusing on remembering to pause due to dizziness upon initial standing, upright posture.  Up/down 5 steps with 2 rails, with supervision to min assist.  Ascended with step-to pattern, with difficulty fulling extending RLE and forward flexing at hips,  descended with alternating step pattern.       Therapy Documentation Precautions:  Precautions Precautions: Fall;Back Precaution Booklet Issued: No (already has booklet) Precaution Comments: Reviewed back precautions Required Braces or Orthoses: Spinal Brace Spinal Brace: Thoracolumbosacral orthotic;Applied in sitting position Restrictions Weight Bearing Restrictions: No    Pain: Pain Assessment Pain Score:   4 Pain Type: Acute pain Pain Location: Back Pain Orientation: Right;Lower Pain Intervention(s): Medication (See eMAR)  Mobility: Bed mobility with supervision; HOB raised.  Donned TLSO with max assist for 3 straps on one side.  Pt stated she planned to have a hospital bed at home.    Locomotion : w/c using bil UEs to propel, x 80' with supervision.   Exercises: in standing- squats in tandem step position, L and R ahead; calf raises, 10 x 1 each.  Increased pain from ex.       See FIM for current functional status  Therapy/Group: Individual Therapy  Abia Monaco 07/12/2011, 9:07 AM

## 2011-07-12 NOTE — Progress Notes (Signed)
Pt in gym with OT, seated; c/o dizziness per OT report; assisted back to bed, b/p 102/66 manually, HR 48.  Dizziness subsided, no further c/o's, no s/s remainder of shift.  HR apically 48-52 random checks through out day. C/o nausea at this time also, reglan effective, no emesis. Pain controlled with 2 vicodan, requesting Q4 hrs. Per portable, "No k-pad available throughout hospital". Onwaiting list.[name], [employer]

## 2011-07-12 NOTE — Progress Notes (Signed)
Occupational Therapy Session Note  Patient Details  Name: MORGHAN KESTER MRN: [id number] Date of Birth: 1950/10/20  Today's Date: 07/12/2011 Time: 1405-1450 Time Calculation (min): 45 min  Short Term Goals: Week 1:  OT Short Term Goal 1 (Week 1): STG=LTG  Skilled Therapeutic Interventions/Progress Updates:    1:1 OT with focus on activity tolerance, standing endurance, and back precautions with mobility.  Engaged in nustep for 10 mins on resistance 3 with pt stating 13-14 on endurance scale (somewhat hard).  Completed checkers activity in standing at high-low table with encouragement for pt to maintain standing balance without leaning on table and use of BUE in standing to decrease UB support when in standing.  Pt required 3-4 rest breaks during standing activity.  Therapy Documentation Precautions:  Precautions Precautions: Fall;Back Precaution Booklet Issued: No (already has booklet) Precaution Comments: Reviewed back precautions Required Braces or Orthoses: Spinal Brace Spinal Brace: Thoracolumbosacral orthotic;Applied in sitting position Restrictions Weight Bearing Restrictions: No General:   Vital Signs: Therapy Vitals Temp: 98.1 F (36.7 C) Temp src: Oral Pulse Rate: 46  (apically) Resp: 22  BP: 102/66 mmHg Patient Position, if appropriate: Lying Oxygen Therapy SpO2: 97 % O2 Device: None (Room air) Pain:   Pt with no c/o pain this session.  See FIM for current functional status  Therapy/Group: Individual Therapy  Leonette Monarch 07/12/2011, 3:10 PM

## 2011-07-12 NOTE — Progress Notes (Signed)
ANTICOAGULATION CONSULT NOTE - Follow Up Consult  Pharmacy Consult for coumadin Indication: atrial fibrillation and h/o mural thrombus  Allergies  Allergen Reactions  . Penicillins Swelling    Throat swells  . Prevacid Nausea Only  . Statins Other (See Comments)    cramps    Patient Measurements: Weight: 167 lb 11.2 oz (76.068 kg) Heparin Dosing Weight:   Vital Signs: Temp: 98.1 F (36.7 C) (04/23 1143) Temp src: Oral (04/23 1143) BP: 102/66 mmHg (04/23 1145) Pulse Rate: 46  (04/23 1145)  Labs:  Basename 07/12/11 0548 07/11/11 0700 07/10/11 0844  HGB -- 12.5 --  HCT -- 36.4 --  PLT -- 159 --  APTT -- -- --  LABPROT 20.2* 23.5* 27.6*  INR 1.69* 2.05* 2.52*  HEPARINUNFRC -- -- --  CREATININE -- 1.35* --  CKTOTAL -- -- --  CKMB -- -- --  TROPONINI -- -- --   The CrCl is unknown because both a height and weight (above a minimum accepted value) are required for this calculation.   Medications:  Scheduled:     . amiodarone  200 mg Oral Daily  . aspirin EC  81 mg Oral Daily  . calcium-vitamin D  1 tablet Oral BID WC  . carvedilol  6.25 mg Oral BID WC  . ezetimibe  10 mg Oral Daily  . fentaNYL  12.5 mcg Transdermal Q72H  . folic acid-pyridoxine-cyancobalamin  1 tablet Oral Daily  . furosemide  20 mg Oral Daily  . levothyroxine  175 mcg Oral Q0600  . lisinopril  2.5 mg Oral BID  . loratadine  10 mg Oral Daily  . omega-3 acid ethyl esters  1,000 mg Oral Q breakfast  . pantoprazole  40 mg Oral BID  . polyethylene glycol  17 g Oral Daily  . potassium chloride SA  20 mEq Oral Daily  . raloxifene  60 mg Oral Daily  . senna  2 tablet Oral QHS  . warfarin  2 mg Oral ONCE-1800  . Warfarin - Pharmacist Dosing Inpatient   Does not apply q1800    Assessment: [name] on coumadin for chronic Afib. INR decreased to 1.69 today probably due to doses held 4/19 and 4/20 for elevated INR.  Goal of Therapy:  INR 2-3   Plan:  - coumadin 3mg  tonight x 1 - Daily  INR  [name] 07/12/2011,12:19 PM

## 2011-07-12 NOTE — Plan of Care (Signed)
Problem: RH SAFETY Goal: RH STG ADHERE TO SAFETY PRECAUTIONS W/ASSISTANCE/DEVICE STG Adhere to Safety Precautions With Assistance/Device min assist  Outcome: Not Progressing Pt breaking back precautions by arching back to relieve pain

## 2011-07-12 NOTE — Progress Notes (Signed)
Patient ID: [name], female   DOB: [birth year], 61 y.o.   MRN: [id number]  Subjective/Complaints: Eating breakfast, bowels sluggish but no nausea  Review of Systems  Gastrointestinal: Positive for nausea, vomiting and abdominal pain.  All other systems reviewed and are negative.   Objective: Vital Signs: Blood pressure 102/68, pulse 52, temperature 98.3 F (36.8 C), temperature source Oral, resp. rate 18, weight 76.068 kg (167 lb 11.2 oz), SpO2 99.00%. No results found. Results for orders placed during the hospital encounter of 07/08/11 (from the past 72 hour(s))  PROTIME-INR     Status: Abnormal   Collection Time   07/10/11  8:44 AM      Component Value Range Comment   Prothrombin Time 27.6 (*) 11.6 - 15.2 (seconds)    INR 2.52 (*) 0.00 - 1.49    CBC     Status: Abnormal   Collection Time   07/11/11  7:00 AM      Component Value Range Comment   WBC 7.1  4.0 - 10.5 (K/uL)    RBC 3.73 (*) 3.87 - 5.11 (MIL/uL)    Hemoglobin 12.5  12.0 - 15.0 (g/dL)    HCT 40.9  81.1 - 91.4 (%)    MCV 97.6  78.0 - 100.0 (fL)    MCH 33.5  26.0 - 34.0 (pg)    MCHC 34.3  30.0 - 36.0 (g/dL)    RDW 78.2 (*) 95.6 - 15.5 (%)    Platelets 159  150 - 400 (K/uL)   COMPREHENSIVE METABOLIC PANEL     Status: Abnormal   Collection Time   07/11/11  7:00 AM      Component Value Range Comment   Sodium 132 (*) 135 - 145 (mEq/L)    Potassium 4.9  3.5 - 5.1 (mEq/L)    Chloride 94 (*) 96 - 112 (mEq/L)    CO2 28  19 - 32 (mEq/L)    Glucose, Bld 112 (*) 70 - 99 (mg/dL)    BUN 19  6 - 23 (mg/dL)    Creatinine, Ser 2.13 (*) 0.50 - 1.10 (mg/dL)    Calcium 9.7  8.4 - 10.5 (mg/dL)    Total Protein 6.7  6.0 - 8.3 (g/dL)    Albumin 3.6  3.5 - 5.2 (g/dL)    AST 27  0 - 37 (U/L) HEMOLYSIS AT THIS LEVEL MAY AFFECT RESULT   ALT 16  0 - 35 (U/L)    Alkaline Phosphatase 45  39 - 117 (U/L)    Total Bilirubin 0.5  0.3 - 1.2 (mg/dL)    GFR calc non Af Amer 41 (*) >90 (mL/min)    GFR calc Af Amer 48 (*) >90 (mL/min)     DIFFERENTIAL     Status: Abnormal   Collection Time   07/11/11  7:00 AM      Component Value Range Comment   Neutrophils Relative 78 (*) 43 - 77 (%)    Neutro Abs 5.5  1.7 - 7.7 (K/uL)    Lymphocytes Relative 12  12 - 46 (%)    Lymphs Abs 0.8  0.7 - 4.0 (K/uL)    Monocytes Relative 9  3 - 12 (%)    Monocytes Absolute 0.6  0.1 - 1.0 (K/uL)    Eosinophils Relative 1  0 - 5 (%)    Eosinophils Absolute 0.0  0.0 - 0.7 (K/uL)    Basophils Relative 0  0 - 1 (%)    Basophils Absolute 0.0  0.0 - 0.1 (K/uL)  PROTIME-INR     Status: Abnormal   Collection Time   07/11/11  7:00 AM      Component Value Range Comment   Prothrombin Time 23.5 (*) 11.6 - 15.2 (seconds)    INR 2.05 (*) 0.00 - 1.49    PROTIME-INR     Status: Abnormal   Collection Time   07/12/11  5:48 AM      Component Value Range Comment   Prothrombin Time 20.2 (*) 11.6 - 15.2 (seconds)    INR 1.69 (*) 0.00 - 1.49      GEN; NAD Mood/affect anxious Lungs Clear Cor:  Bradycardia, no murmur Abd:  +BS, soft, mild diffuse tenderness Ext: no C/C/E Motor 4/5 in BUE and BLE   Assessment/Plan: 1. Functional deficits secondary to Deconditioning which require 3+ hours per day of interdisciplinary therapy in a comprehensive inpatient rehab setting. Physiatrist is providing close team supervision and 24 hour management of active medical problems listed below. Physiatrist and rehab team continue to assess barriers to discharge/monitor patient progress toward functional and medical goals. FIM: FIM - Bathing Bathing Steps Patient Completed: Chest;Right Arm;Left Arm;Abdomen;Front perineal area;Left upper leg;Right upper leg;Left lower leg (including foot);Right lower leg (including foot);Buttocks Bathing: 5: Supervision: Safety issues/verbal cues  FIM - Upper Body Dressing/Undressing Upper body dressing/undressing steps patient completed: Thread/unthread right sleeve of pullover shirt/dresss;Thread/unthread left sleeve of pullover  shirt/dress;Put head through opening of pull over shirt/dress;Pull shirt over trunk Upper body dressing/undressing: 5: Set-up assist to: Apply TLSO, cervical collar FIM - Lower Body Dressing/Undressing Lower body dressing/undressing steps patient completed: Thread/unthread left pants leg;Thread/unthread right pants leg;Don/Doff right sock;Don/Doff left sock Lower body dressing/undressing: 5: Set-up assist to: Don/Doff TED stocking  FIM - Toileting Toileting steps completed by patient: Adjust clothing prior to toileting;Performs perineal hygiene;Adjust clothing after toileting Toileting Assistive Devices: Grab bar or rail for support Toileting: 5: Supervision: Safety issues/verbal cues  FIM - Diplomatic Services operational officer Devices: Grab bars Toilet Transfers: 5-From toilet/BSC: Supervision (verbal cues/safety issues);5-To toilet/BSC: Supervision (verbal cues/safety issues)  FIM - Bed/Chair Transfer Bed/Chair Transfer Assistive Devices: Bed rails;Arm rests;Orthosis;HOB elevated Bed/Chair Transfer: 5: Supine > Sit: Supervision (verbal cues/safety issues);4: Bed > Chair or W/C: Min A (steadying Pt. > 75%);4: Chair or W/C > Bed: Min A (steadying Pt. > 75%)  FIM - Locomotion: Wheelchair Locomotion: Wheelchair: 5: Travels 50 - 149 ft, turns around, maneuvers to table, bed or toilet, negotiates 3% grade: modified independent FIM - Locomotion: Ambulation Locomotion: Ambulation Assistive Devices: Designer, industrial/product Ambulation/Gait Assistance: 4: Min assist Locomotion: Ambulation: 2: Travels 50 - 149 ft with minimal assistance (Pt.>75%)  Comprehension Comprehension Mode: Auditory Comprehension: 7-Follows complex conversation/direction: With no assist  Expression Expression Mode: Verbal Expression: 7-Expresses complex ideas: With no assist  Social Interaction Social Interaction: 6-Interacts appropriately with others with medication or extra time (anti-anxiety,  antidepressant).  Problem Solving Problem Solving: 3-Solves basic 50 - 74% of the time/requires cueing 25 - 49% of the time  Memory Memory: 7-Complete Independence: No helper  2. Anticoagulation/DVT prophylaxis with Pharmaceutical: Coumadin Medical Problem List and Plan:  1. Lumbar L2 compression fracture with osteoporosis. Conservative care with a back brace  2. DVT Prophylaxis/Anticoagulation: Chronic Coumadin therapy for history of left ventricular mural thrombus. Monitor for any signs of bleeding  3. Pain Management: Duragesic patch 12.5 mcg change every 72 hours and will titrate as needed.  Robaxin as needed. Vicodin as needed  4. Hypertension. Amiodarone 200 mg daily, Coreg 12.5 mg twice a day, Lasix 20  mg daily, lisinopril 2.5 mg twice daily. Monitor with increased mobility  5. Hypothyroidism. Synthroid  Hyperlipidemia. zetia  6. Chronic renal insufficiency. Baseline creatinine 1.2-1.5. Strict I&O  7. Anxiety/depression. BuSpar as needed  8 diverticulitis. Protonix  9. Chronic left lumbosacral radiculopathy 10.  Bradycardia due to beta blocker will reduce dose and monitor HR, if still below 60 bpm tomorrow change to 3.125mg  coreg 11.  [place]- likely med related improved LOS (Days) 4 A FACE TO FACE EVALUATION WAS PERFORMED  [name] E 07/12/2011, 9:38 AM

## 2011-07-12 NOTE — Progress Notes (Signed)
Occupational Therapy Session Note  Patient Details  Name: [name] MRN: [id number] Date of Birth: [birth year]  Today's Date: 07/12/2011 Time: 1010-1135 Time Calculation (min): 85 min  Short Term Goals: Week 1:  OT Short Term Goal 1 (Week 1): STG=LTG  Skilled Therapeutic Interventions/Progress Updates:   Pt seen for BADL retraining of bathing at sink level and dressing with a focus on standing tolerance and activity tolerance.  Pt is at a supervision level with ADLs, but needs assist to don briefs and Ted hose.  She needs numerous rest breaks.  Pt then worked on basic mobility tasks within the ADL apt, but became too dizzy from standing and began to feel nauseous.  Pt returned to bed, given ice water, nurse notified.  Pt felt better once in bed.     Therapy Documentation Precautions:  Precautions Precautions: Fall;Back Precaution Booklet Issued: No (already has booklet) Precaution Comments: Reviewed back precautions Required Braces or Orthoses: Spinal Brace Spinal Brace: Thoracolumbosacral orthotic;Applied in sitting position Restrictions Weight Bearing Restrictions: No    Vital Signs: Therapy Vitals Temp: 98.1 F (36.7 C) Temp src: Oral Resp: 22  BP: 113/71 mmHg Patient Position, if appropriate: Lying Oxygen Therapy SpO2: 97 % O2 Device: None (Room air)  Pain: Pain Assessment Pain Assessment: No/denies pain Pt had received pain medication prior to OT.  See FIM for current functional status  Therapy/Group: Individual Therapy  [name] 07/12/2011, 11:50 AM

## 2011-07-12 NOTE — Plan of Care (Signed)
Problem: SCI BLADDER ELIMINATION Goal: RH STG MANAGE BLADDER WITH ASSISTANCE STG Manage Bladder With Assistance mod assist  Outcome: Not Progressing Several episodes of urinary incontinence in brief today

## 2011-07-13 LAB — URINALYSIS, ROUTINE W REFLEX MICROSCOPIC
Ketones, ur: NEGATIVE mg/dL
Nitrite: NEGATIVE
Protein, ur: NEGATIVE mg/dL

## 2011-07-13 LAB — PROTIME-INR: Prothrombin Time: 20.4 seconds — ABNORMAL HIGH (ref 11.6–15.2)

## 2011-07-13 MED ORDER — WARFARIN SODIUM 3 MG PO TABS
3.0000 mg | ORAL_TABLET | Freq: Once | ORAL | Status: AC
  Administered 2011-07-13: 3 mg via ORAL
  Filled 2011-07-13: qty 1

## 2011-07-13 MED ORDER — CARVEDILOL 3.125 MG PO TABS
3.1250 mg | ORAL_TABLET | Freq: Two times a day (BID) | ORAL | Status: DC
  Administered 2011-07-14: 3.125 mg via ORAL
  Filled 2011-07-13 (×4): qty 1

## 2011-07-13 NOTE — Progress Notes (Signed)
ANTICOAGULATION CONSULT NOTE - Follow Up Consult  Pharmacy Consult for coumadin Indication: atrial fibrillation and h/o mural thrombus  Allergies  Allergen Reactions  . Penicillins Swelling    Throat swells  . Prevacid Nausea Only  . Statins Other (See Comments)    cramps    Patient Measurements: Weight: 167 lb 11.2 oz (76.068 kg)  Vital Signs: Temp: 97.9 F (36.6 C) (04/24 0506) Temp src: Oral (04/24 0506) BP: 97/61 mmHg (04/24 0506) Pulse Rate: 46  (04/24 0506)  Labs:  Basename 07/13/11 0500 07/12/11 0548 07/11/11 0700  HGB -- -- 12.5  HCT -- -- 36.4  PLT -- -- 159  APTT -- -- --  LABPROT 20.4* 20.2* 23.5*  INR 1.71* 1.69* 2.05*  HEPARINUNFRC -- -- --  CREATININE -- -- 1.35*  CKTOTAL -- -- --  CKMB -- -- --  TROPONINI -- -- --   The CrCl is unknown because both a height and weight (above a minimum accepted value) are required for this calculation.   Medications:  Scheduled:     . amiodarone  200 mg Oral Daily  . aspirin EC  81 mg Oral Daily  . calcium-vitamin D  1 tablet Oral BID WC  . carvedilol  6.25 mg Oral BID WC  . ezetimibe  10 mg Oral Daily  . fentaNYL  12.5 mcg Transdermal Q72H  . folic acid-pyridoxine-cyancobalamin  1 tablet Oral Daily  . furosemide  20 mg Oral Daily  . levothyroxine  175 mcg Oral Q0600  . lisinopril  2.5 mg Oral BID  . loratadine  10 mg Oral Daily  . omega-3 acid ethyl esters  1,000 mg Oral Q breakfast  . pantoprazole  40 mg Oral BID  . polyethylene glycol  17 g Oral Daily  . potassium chloride SA  20 mEq Oral Daily  . raloxifene  60 mg Oral Daily  . senna  2 tablet Oral QHS  . warfarin  3 mg Oral ONCE-1800  . Warfarin - Pharmacist Dosing Inpatient   Does not apply q1800    Assessment: [name] on coumadin for chronic Afib. INR 1.71 today.  INR starting to increase after doses held 4/19 and 4/20 for elevated INR.  Goal of Therapy:  INR 2-3   Plan:  - coumadin 3mg  tonight x 1 - Daily INR  Tejal Monroy  [place] 07/13/2011,9:30 AM

## 2011-07-13 NOTE — Patient Care Conference (Signed)
Inpatient RehabilitationTeam Conference Note Date: 07/13/2011   Time: 11:30 AM    Patient Name: [name]      Medical Record Number: [id number]  Date of Birth: June 19, 1950 Sex: Female         Room/Bed: 4140/4140-01 Payor Info: Payor: MEDICARE  Plan: MEDICARE PART A AND B  Product Type: *No Product type*     Admitting Diagnosis: L2 Compression Fx  Admit Date/Time:  07/08/2011  3:12 PM Admission Comments: No comment available   Primary Diagnosis:  Compression fracture of L2 Principal Problem: Compression fracture of L2  Patient Active Problem List  Diagnoses Date Noted  . Compression fracture of L2 07/08/2011  . L2 vertebral fracture 07/05/2011  . HTN (hypertension) 07/05/2011  . PAF (paroxysmal atrial fibrillation) 07/05/2011  . Dyslipidemia 07/05/2011  . Carotid stenosis 06/13/2011  . Unspecified disorder of urethra and urinary tract 05/23/2011  . CAD (coronary artery disease)   . Unstable angina 02/17/2011  . Anemia 12/22/2010  . Atrial fibrillation 11/16/2010  . implantable cardiac defibrillator- [employer]-single-chamber 11/16/2010  . LV (left ventricular) mural thrombus   . [employer] AICD   . Spinal stenosis   . Numbness of foot   . Ischemia, bowel   . Ischemic cardiomyopathy   . Chronic anticoagulation   . Stroke   . Arthritis   . CKD (chronic kidney disease)   . Tobacco abuse   . GERD (gastroesophageal reflux disease)   . HYPERLIPIDEMIA 11/22/2009  . HYPERTENSION 11/22/2009  . HYPOTHYROIDISM 11/20/2009  . MYOCARDIAL INFARCTION, HX OF 11/20/2009  . Chronic systolic heart failure 11/20/2009  . OSTEOARTHRITIS 11/20/2009    Expected Discharge Date: Expected Discharge Date: 07/16/11  Team Members Present: Physician: Dr. Claudette Laws Case Manager Present: Lutricia Horsfall, RN Social Worker Present: Dossie Der, LCSW Nurse Present: Gregor Hams, RN PT Present: Wanda Plump, PT OT Present: Bretta Bang, Verlene Mayer, OT SLP  Present: Fae Pippin, SLP Perrin Maltese, OT Tora Duck, RN, PPS Coordinator     Current Status/Progress Goal Weekly Team Focus  Medical   Chronic L lumbosacral radic, back pain controlled  Improve safety, fit afo for foot drop  see above, D/C preparation   Bowel/Bladder     Incontinent of urine at times. Wears brief.      LBM 4/24.   Check for UTI.    Continent of B&B     Swallow/Nutrition/ Hydration             ADL's   supervision, with assist to don brief and TED hose  mod I  activity tolerance, standing endurance, back precautions, pt ed.   Mobility   min assist transfer and gait with RW x 125'; supervision w/c x 80'  mod I overall, including 2 steps with 1 rail  carry over of back precautions even when in pain, increasing activity tolerance, mobility training   Communication             Safety/Cognition/ Behavioral Observations            Pain     Back pain. Using fentanyl patch. Hydrocodone & robaxin prn   Pain <3     Skin                *See Interdisciplinary Assessment and Plan and progress notes for long and short-term goals  Barriers to Discharge: no family to assist    Possible Resolutions to Barriers:  Mod I goals    Discharge Planning/Teaching Needs:  HOme with brother chcecking  in on pt, will be alone      Team Discussion:  Discussion of dx and hx. Wears brace for comfort. [facility] go to [facility] without brace. Low HR & BP. C/o dizziness at times. Adjusting med. Bladder incontinence is new -- to check UA. L foot drop needs AFO.  Revisions to Treatment Plan: none    Continued Need for Acute Rehabilitation Level of Care: The patient requires daily medical management by a physician with specialized training in physical medicine and rehabilitation for the following conditions: Daily direction of a multidisciplinary physical rehabilitation program to ensure safe treatment while eliciting the highest outcome that is of practical value to the patient.: Yes Daily  medical management of patient stability for increased activity during participation in an intensive rehabilitation regime.: Yes Daily analysis of laboratory values and/or radiology reports with any subsequent need for medication adjustment of medical intervention for : Neurological problems;Other  [name] 07/13/2011, 5:46 PM

## 2011-07-13 NOTE — Progress Notes (Signed)
Becky Love, Pa made aware of patient's manual BP 98/62 apical heart rate 45. Verbally instructed to hold Coreg and Prinivil. Will continue to monitor. Roberts-VonCannon, Eliseo Withers Elon Jester

## 2011-07-13 NOTE — Plan of Care (Signed)
Problem: SCI BLADDER ELIMINATION Goal: RH STG MANAGE BLADDER WITH ASSISTANCE STG Manage Bladder With Assistance mod assist  Outcome: Not Progressing Incontinent of bladder, depends in use, toilet q 3 and prn, up to Bedside commode or toilet, UA and C&S sent today

## 2011-07-13 NOTE — Progress Notes (Signed)
Social Work Patient ID: [name], female   DOB: Aug 12, 1950, 61 y.o.   MRN: [id number] Met with pt to inform team conference goals-mod/i level and discharge Sat 4/27. She will need a hospital bed and follow up therapies. She prefers [place] since she had them before.  Will make referral for [facility] and also the hospital bed.  Pt requires the hospital for her back  Issues and her CHF she needs the bed for the elevation due to breathing issues and inability to use a wedge is not enough elevation.  She is also Able to re-position herself with her chronic pain issues from multiple back surgeries.  Pt very pleased with the team's recommendations and  Discharge date.

## 2011-07-13 NOTE — Progress Notes (Signed)
Occupational Therapy Session Note  Patient Details  Name: [name] MRN: [id number] Date of Birth: Aug 06, 1950  Today's Date: 07/13/2011 Time: 1336-1400 Time Calculation (min): 24 min  Skilled Therapeutic Interventions/Progress Updates:    Worked on donning TLSO sitting EOB.  Pt with increased difficulty threading straps on the left side and tightening.  Able to thread the middle and lower strap with increased time sitting EOB but needs assist with the upper strap.  Therapist cut angles into the strapping material to allow for it to feed into the buckle easier.  Pt able to transfer wheelchair to bed and bed to wheelchair with min assist using the Rw.  Therapy Documentation Precautions:  Precautions Precautions: Fall Precaution Booklet Issued: No (already has booklet) Precaution Comments: Reviewed back precautions Required Braces or Orthoses: Spinal Brace Spinal Brace: Thoracolumbosacral orthotic Restrictions Weight Bearing Restrictions: No  Pain: Pain Assessment Pain Assessment: 0-10 Pain Score:   6 Pain Type: Acute pain Pain Location: Back Pain Orientation: Right;Lower Pain Descriptors: Aching Pain Frequency: Intermittent Pain Onset: Gradual Patients Stated Pain Goal: 3 Pain Intervention(s): Medication (See eMAR) Multiple Pain Sites: No  Other Treatments:    See FIM for current functional status  Therapy/Group: Individual Therapy  Ogle Hoeffner 07/13/2011, 3:29 PM

## 2011-07-13 NOTE — Progress Notes (Signed)
Pt continues with low blood pressure and heart rate. Prinivil and coreg held this am. Blood pressure this evening 96/63-45-18-96% on room air at 1600. Recheck 104/63. At 1800 patient due for coreg manual BP 94/62 apical heart rate 45. Discussed with Deatra Ina ,PA, will hold tonight dose and re evaluate in am with MD rounds. Pt aware of plan of care. Will continue to monitor. Roberts-VonCannon, Maresha Anastos Elon Jester

## 2011-07-13 NOTE — Progress Notes (Signed)
Patient ID: [name], female   DOB: Oct 09, 1950, 61 y.o.   MRN: [id number]  Subjective/Complaints: Bowels loose yesterday now normalizing  Review of Systems  Gastrointestinal: Positive for nausea, vomiting and abdominal pain.  All other systems reviewed and are negative.   Objective: Vital Signs: Blood pressure 97/61, pulse 46, temperature 97.9 F (36.6 C), temperature source Oral, resp. rate 18, weight 76.068 kg (167 lb 11.2 oz), SpO2 99.00%. No results found. Results for orders placed during the hospital encounter of 07/08/11 (from the past 72 hour(s))  CBC     Status: Abnormal   Collection Time   07/11/11  7:00 AM      Component Value Range Comment   WBC 7.1  4.0 - 10.5 (K/uL)    RBC 3.73 (*) 3.87 - 5.11 (MIL/uL)    Hemoglobin 12.5  12.0 - 15.0 (g/dL)    HCT 40.9  81.1 - 91.4 (%)    MCV 97.6  78.0 - 100.0 (fL)    MCH 33.5  26.0 - 34.0 (pg)    MCHC 34.3  30.0 - 36.0 (g/dL)    RDW 78.2 (*) 95.6 - 15.5 (%)    Platelets 159  150 - 400 (K/uL)   COMPREHENSIVE METABOLIC PANEL     Status: Abnormal   Collection Time   07/11/11  7:00 AM      Component Value Range Comment   Sodium 132 (*) 135 - 145 (mEq/L)    Potassium 4.9  3.5 - 5.1 (mEq/L)    Chloride 94 (*) 96 - 112 (mEq/L)    CO2 28  19 - 32 (mEq/L)    Glucose, Bld 112 (*) 70 - 99 (mg/dL)    BUN 19  6 - 23 (mg/dL)    Creatinine, Ser 2.13 (*) 0.50 - 1.10 (mg/dL)    Calcium 9.7  8.4 - 10.5 (mg/dL)    Total Protein 6.7  6.0 - 8.3 (g/dL)    Albumin 3.6  3.5 - 5.2 (g/dL)    AST 27  0 - 37 (U/L) HEMOLYSIS AT THIS LEVEL MAY AFFECT RESULT   ALT 16  0 - 35 (U/L)    Alkaline Phosphatase 45  39 - 117 (U/L)    Total Bilirubin 0.5  0.3 - 1.2 (mg/dL)    GFR calc non Af Amer 41 (*) >90 (mL/min)    GFR calc Af Amer 48 (*) >90 (mL/min)   DIFFERENTIAL     Status: Abnormal   Collection Time   07/11/11  7:00 AM      Component Value Range Comment   Neutrophils Relative 78 (*) 43 - 77 (%)    Neutro Abs 5.5  1.7 - 7.7 (K/uL)    Lymphocytes Relative 12  12 - 46 (%)    Lymphs Abs 0.8  0.7 - 4.0 (K/uL)    Monocytes Relative 9  3 - 12 (%)    Monocytes Absolute 0.6  0.1 - 1.0 (K/uL)    Eosinophils Relative 1  0 - 5 (%)    Eosinophils Absolute 0.0  0.0 - 0.7 (K/uL)    Basophils Relative 0  0 - 1 (%)    Basophils Absolute 0.0  0.0 - 0.1 (K/uL)   PROTIME-INR     Status: Abnormal   Collection Time   07/11/11  7:00 AM      Component Value Range Comment   Prothrombin Time 23.5 (*) 11.6 - 15.2 (seconds)    INR 2.05 (*) 0.00 - 1.49    PROTIME-INR  Status: Abnormal   Collection Time   07/12/11  5:48 AM      Component Value Range Comment   Prothrombin Time 20.2 (*) 11.6 - 15.2 (seconds)    INR 1.69 (*) 0.00 - 1.49    PROTIME-INR     Status: Abnormal   Collection Time   07/13/11  5:00 AM      Component Value Range Comment   Prothrombin Time 20.4 (*) 11.6 - 15.2 (seconds)    INR 1.71 (*) 0.00 - 1.49      GEN; NAD Mood/affect anxious Lungs Clear Cor:  Bradycardia, no murmur Abd:  +BS, soft, mild diffuse tenderness Ext: no C/C/E Motor 4/5 in BUE and BLE   Assessment/Plan: 1. Functional deficits secondary to Deconditioning which require 3+ hours per day of interdisciplinary therapy in a comprehensive inpatient rehab setting. Physiatrist is providing close team supervision and 24 hour management of active medical problems listed below. Physiatrist and rehab team continue to assess barriers to discharge/monitor patient progress toward functional and medical goals. FIM: FIM - Bathing Bathing Steps Patient Completed: Chest;Right Arm;Left Arm;Abdomen;Front perineal area;Left upper leg;Right upper leg;Left lower leg (including foot);Right lower leg (including foot);Buttocks Bathing: 5: Supervision: Safety issues/verbal cues  FIM - Upper Body Dressing/Undressing Upper body dressing/undressing steps patient completed: Thread/unthread right sleeve of pullover shirt/dresss;Thread/unthread left sleeve of pullover  shirt/dress;Put head through opening of pull over shirt/dress;Pull shirt over trunk Upper body dressing/undressing: 5: Set-up assist to: Apply TLSO, cervical collar FIM - Lower Body Dressing/Undressing Lower body dressing/undressing steps patient completed: Thread/unthread left pants leg;Thread/unthread right pants leg;Don/Doff right sock;Don/Doff left sock Lower body dressing/undressing: 5: Set-up assist to: Don/Doff TED stocking  FIM - Toileting Toileting steps completed by patient: Adjust clothing prior to toileting;Performs perineal hygiene;Adjust clothing after toileting Toileting Assistive Devices: Grab bar or rail for support Toileting: 2: Max-Patient completed 1 of 3 steps  FIM - Diplomatic Services operational officer Devices: Grab bars Toilet Transfers: 4-To toilet/BSC: Min A (steadying Pt. > 75%)  FIM - Bed/Chair Transfer Bed/Chair Transfer Assistive Devices: Bed rails;Arm rests;Orthosis;HOB elevated Bed/Chair Transfer: 5: Bed > Chair or W/C: Supervision (verbal cues/safety issues)  FIM - Locomotion: Wheelchair Locomotion: Wheelchair: 0: Activity did not occur FIM - Locomotion: Ambulation Locomotion: Ambulation Assistive Devices: Designer, industrial/product Ambulation/Gait Assistance: 5: Supervision Locomotion: Ambulation: 2: Travels 50 - 149 ft with supervision/safety issues  Comprehension Comprehension Mode: Auditory Comprehension: 7-Follows complex conversation/direction: With no assist  Expression Expression Mode: Verbal Expression: 7-Expresses complex ideas: With no assist  Social Interaction Social Interaction: 6-Interacts appropriately with others with medication or extra time (anti-anxiety, antidepressant).  Problem Solving Problem Solving: 5-Solves complex 90% of the time/cues < 10% of the time  Memory Memory: 6-More than reasonable amt of time  2. Anticoagulation/DVT prophylaxis with Pharmaceutical: Coumadin Medical Problem List and Plan:  1. Lumbar L2  compression fracture with osteoporosis. Conservative care with a back brace  2. DVT Prophylaxis/Anticoagulation: Chronic Coumadin therapy for history of left ventricular mural thrombus. Monitor for any signs of bleeding  3. Pain Management: Duragesic patch 12.5 mcg change every 72 hours and will titrate as needed.  Robaxin as needed. Vicodin as needed  4. Hypertension. Amiodarone 200 mg daily, Coreg 6.25 mg twice a day, Lasix 20 mg daily, lisinopril 2.5 mg twice daily. Monitor with increased mobility  5. Hypothyroidism. Synthroid  Hyperlipidemia. zetia  6. Chronic renal insufficiency. Baseline creatinine 1.2-1.5. Strict I&O  7. Anxiety/depression. BuSpar as needed  8 diverticulitis. Protonix  9. Chronic left lumbosacral radiculopathy  10.  Bradycardia due to beta blocker will reduce dose and monitor HR,  change to 3.125mg  coreg 11.  Nasuea- likely med related improved LOS (Days) 5 A FACE TO FACE EVALUATION WAS PERFORMED  Thais Silberstein E 07/13/2011, 9:36 AM

## 2011-07-13 NOTE — Plan of Care (Signed)
Problem: SCI BOWEL ELIMINATION Goal: RH STG MANAGE BOWEL WITH ASSISTANCE STG Manage Bowel with Assistance min assist.  Outcome: Progressing Bm today, started before on Toilet, depends in use

## 2011-07-13 NOTE — Progress Notes (Signed)
Occupational Therapy Session Note  Patient Details  Name: [name] MRN: [id number] Date of Birth: April 15, 1950  Today's Date: 07/13/2011 Time: 7829-5621 Time Calculation (min): 61 min  Short Term Goals: Week 1:  OT Short Term Goal 1 (Week 1): STG=LTG  Skilled Therapeutic Interventions/Progress Updates:   Pt seen for BADL retraining of toileting, bathing at shower level, and dressing with a focus on activity tolerance.  Pt's physician cleared to ambulate to bathroom without TLSO on.  Pt walked to shower with RW with supervision and bathed in shower with shower seat with cues.  Pt able to don her own TED hose today.  She needed 4-5 short rest breaks. To prepare for discharge, pt needs to practice stepping in and out of tub and donning her own TLSO.  Pt had some incontinence of bowel in her brief and notified nurse.     Therapy Documentation Precautions:  Precautions Precautions: Fall Precaution Booklet Issued: No (already has booklet) Precaution Comments: Reviewed back precautions Required Braces or Orthoses: Spinal Brace Spinal Brace: Thoracolumbosacral orthotic Restrictions Weight Bearing Restrictions: No    Pain: Pain Assessment Pain Assessment: No/denies pain See FIM for current functional status  Therapy/Group: Individual Therapy  Winner Valeriano 07/13/2011, 10:46 AM

## 2011-07-13 NOTE — Progress Notes (Signed)
Physical Therapy Session Note  Patient Details  Name: [name] MRN: [id number] Date of Birth: 1950-08-31  Today's Date: 07/13/2011 Time: 0805-0900 and 1405- 1445 Time Calculation (min): 55 min and 40 min  Short Term Goals: Week 1:  PT Short Term Goal 1 (Week 1): Patient will be able to perform bed mobility with Mod-I assist. PT Short Term Goal 2 (Week 1): Patient will be able to perform transfers from bed to chair with Mod-I assist. PT Short Term Goal 3 (Week 1): Patient will be able to perform car transfers with Mod-I assist. PT Short Term Goal 4 (Week 1): Patient will ambulate using LRAD x 50' in home environment with Mod-I assist. PT Short Term Goal 5 (Week 1): Patient will be able to ascend/descend 2 steps with Left handrail with Mod-I assist.  Skilled Therapeutic Interventions/Progress Updates: Pt continues to c/o dizziness upon standing; MD to adjust meds.  PA wrote order for L AFO; therapist consulted by phone with [name], [place] regarding L AFO.  AM  tx- donned socks with mod I, and shoes with assist for shoestoilet transfer w/c> commode chair, with min assist;  gait training x 140', x 140' (other activities between walks) with RW, TLSO, working on upright posture, forward gaze, with supervision except for 1 instance of mod assist, due to L toes catching when L foot drop emerges as pt fatigues  PM  tx- gait training as above, and advanced gait training L and R and backwards x 5' each; up/down 4 steps with 1 rail, L ascending with supervision   therapeutic exercise performed with bil LEs to increase strength for functional mobility   simulated car transfer using RW with supervision, 1 VC to prompt for back precautions before starting car transfer   basic transfers w/c to bed using RW,  with supervision        Therapy Documentation Precautions:  Precautions Precautions: Fall Precaution Booklet Issued: No (already has booklet) Precaution Comments: Reviewed back  precautions Required Braces or Orthoses: Spinal Brace to be worn when ambulating; ok without for w/c>< toilet  Spinal Brace: Thoracolumbosacral orthotic Restrictions Weight Bearing Restrictions: No     Pain: Pain Assessment Pain Assessment: 0-10 Pain Score:   6; increased to 8 during LE ex Pain Type: Acute pain Pain Location: Back Pain Orientation: Right;Lower Pain Descriptors: Aching Pain Frequency: Intermittent Pain Onset: Gradual Patients Stated Pain Goal: 3 Pain Intervention(s): Medication (See eMAR) Multiple Pain Sites: No    Exercises:  AM sitting- alternating hip flexion; knee extension with isometric hold at full extension, 20 x 1 each; NuStep at level 4 x 6 min, LEs only  PM standing with UE support, feet in tandem position, 1 x 10 mini squats with R and L foot forward       See FIM for current functional status  Therapy/Group: Individual Therapy  [name] 07/13/2011, 3:10 PM

## 2011-07-13 NOTE — Plan of Care (Signed)
Problem: SCI BOWEL ELIMINATION Goal: RH STG SCI MANAGE BOWEL WITH MEDICATION WITH ASSISTANCE STG SCI Manage bowel with medication with assistance min assist  Outcome: Progressing Pt on miralax and Senokot-S, will hold today per pt request

## 2011-07-14 LAB — PROTIME-INR: INR: 1.94 — ABNORMAL HIGH (ref 0.00–1.49)

## 2011-07-14 MED ORDER — TRAMADOL HCL 50 MG PO TABS
50.0000 mg | ORAL_TABLET | Freq: Four times a day (QID) | ORAL | Status: DC
  Administered 2011-07-14 – 2011-07-16 (×7): 50 mg via ORAL
  Filled 2011-07-14 (×13): qty 1

## 2011-07-14 MED ORDER — WARFARIN SODIUM 3 MG PO TABS
3.0000 mg | ORAL_TABLET | Freq: Once | ORAL | Status: AC
  Administered 2011-07-14: 3 mg via ORAL
  Filled 2011-07-14: qty 1

## 2011-07-14 MED ORDER — HYDROCODONE-ACETAMINOPHEN 5-325 MG PO TABS
2.0000 | ORAL_TABLET | ORAL | Status: DC | PRN
  Administered 2011-07-14 – 2011-07-15 (×4): 2 via ORAL
  Filled 2011-07-14 (×4): qty 2

## 2011-07-14 NOTE — Progress Notes (Signed)
Per State Regulation 482.30 This chart was reviewed for medical necessity with respect to the patient's Admission/Duration of stay. Pt progressing toward goals. No barrier noted to d/c on 4/27. [name]                 Nurse Care Manager

## 2011-07-14 NOTE — Progress Notes (Signed)
Recreational Therapy Session Note  Patient Details  Name: LAKRESHA STIFTER MRN: [id number] Date of Birth: 12-31-1950 Today's Date: 07/14/2011  Pain: 4/10 back pain, RN aware and medicating Skilled Therapeutic Interventions/Progress Updates: pt stood for wii bowling without AD with supervision for ~9 minutes.  Pt Mod I for seated TR tasks.  Therapy/Group: Co-Treatment  Oskar Cretella 07/14/2011, 4:34 PM

## 2011-07-14 NOTE — Progress Notes (Signed)
ANTICOAGULATION CONSULT NOTE - Follow Up Consult  Pharmacy Consult:  Coumadin Indication: atrial fibrillation and h/o mural thrombus  Allergies  Allergen Reactions  . Penicillins Swelling    Throat swells  . Prevacid Nausea Only  . Statins Other (See Comments)    cramps    Patient Measurements: Weight: 160 lb 0.9 oz (72.6 kg)  Vital Signs: Temp: 97.9 F (36.6 C) (04/25 0558) Temp src: Oral (04/25 0558) BP: 100/62 mmHg (04/25 0558) Pulse Rate: 46  (04/25 0558)  Labs:  [name] 07/14/11 0532 07/13/11 0500 07/12/11 0548  HGB -- -- --  HCT -- -- --  PLT -- -- --  APTT -- -- --  LABPROT 22.5* 20.4* 20.2*  INR 1.94* 1.71* 1.69*  HEPARINUNFRC -- -- --  CREATININE -- -- --  CKTOTAL -- -- --  CKMB -- -- --  TROPONINI -- -- --   The CrCl is unknown because both a height and weight (above a minimum accepted value) are required for this calculation.    Assessment: [name] on Coumadin for chronic Afib.  INR trending up towards goal, no bleeding noted.  Was on Coumadin 3mg  PO daily PTA.   Goal of Therapy:  INR 2-3    Plan:  - Repeat Coumadin 3mg  PO today - Daily INR - F/U vitals and the need to hold antihypertensives    [name] D. [name], PharmD, BCPS Pager:  [phone] 07/14/2011, 11:39 AM

## 2011-07-14 NOTE — Progress Notes (Signed)
Social Work Patient ID: [name], female   DOB: 1950-07-25, 61 y.o.   MRN: [id number] Met with pt to inform need to use [facility] for hospital bed due to [facility] coverage.  She is agreeable Feels better today.  Looking forward to discharge Sat.  [facility] to provide follow up.  [facility] to contact brother To schedule bed delivery.  Continue to work on discharge plans.

## 2011-07-14 NOTE — Progress Notes (Signed)
Patient ID: [name], female   DOB: 02-08-1951, 61 y.o.   MRN: [id number]  Subjective/Complaints: Nausea and vomiting this am.No diarrhea.  Pain control is ok  Review of Systems  Gastrointestinal: Positive for nausea and vomiting. Negative for abdominal pain.  Neurological: Positive for dizziness.       When standing up  All other systems reviewed and are negative.   Objective: Vital Signs: Blood pressure 100/62, pulse 46, temperature 97.9 F (36.6 C), temperature source Oral, resp. rate 19, weight 72.6 kg (160 lb 0.9 oz), SpO2 98.00%. No results found. Results for orders placed during the hospital encounter of 07/08/11 (from the past 72 hour(s))  PROTIME-INR     Status: Abnormal   Collection Time   07/12/11  5:48 AM      Component Value Range Comment   Prothrombin Time 20.2 (*) 11.6 - 15.2 (seconds)    INR 1.69 (*) 0.00 - 1.49    PROTIME-INR     Status: Abnormal   Collection Time   07/13/11  5:00 AM      Component Value Range Comment   Prothrombin Time 20.4 (*) 11.6 - 15.2 (seconds)    INR 1.71 (*) 0.00 - 1.49    URINALYSIS, ROUTINE W REFLEX MICROSCOPIC     Status: Abnormal   Collection Time   07/13/11  1:53 PM      Component Value Range Comment   Color, Urine YELLOW  YELLOW     APPearance CLOUDY (*) CLEAR     Specific Gravity, Urine 1.012  1.005 - 1.030     pH 6.0  5.0 - 8.0     Glucose, UA NEGATIVE  NEGATIVE (mg/dL)    Hgb urine dipstick SMALL (*) NEGATIVE     Bilirubin Urine NEGATIVE  NEGATIVE     Ketones, ur NEGATIVE  NEGATIVE (mg/dL)    Protein, ur NEGATIVE  NEGATIVE (mg/dL)    Urobilinogen, UA 1.0  0.0 - 1.0 (mg/dL)    Nitrite NEGATIVE  NEGATIVE     Leukocytes, UA MODERATE (*) NEGATIVE    URINE MICROSCOPIC-ADD ON     Status: Abnormal   Collection Time   07/13/11  1:53 PM      Component Value Range Comment   Squamous Epithelial / LPF MANY (*) RARE     WBC, UA 3-6  <3 (WBC/hpf)    RBC / HPF 0-2  <3 (RBC/hpf)    Bacteria, UA FEW (*) RARE     Urine-Other  AMORPHOUS URATES/PHOSPHATES     PROTIME-INR     Status: Abnormal   Collection Time   07/14/11  5:32 AM      Component Value Range Comment   Prothrombin Time 22.5 (*) 11.6 - 15.2 (seconds)    INR 1.94 (*) 0.00 - 1.49      GEN; NAD Mood/affect anxious Lungs Clear Cor:  Bradycardia, no murmur Abd:  +BS, soft,no tenderness Ext: no C/C/[name] Motor 4/5 in BUE and BLE Gait- rolling walker, no toe drag, SBA  Assessment/Plan: 1. Functional deficits secondary to Deconditioning which require 3+ hours per day of interdisciplinary therapy in a comprehensive inpatient rehab setting. Physiatrist is providing close team supervision and 24 hour management of active medical problems listed below. Physiatrist and rehab team continue to assess barriers to discharge/monitor patient progress toward functional and medical goals. FIM: FIM - Bathing Bathing Steps Patient Completed: Right Arm;Left Arm;Abdomen;Front perineal area;Buttocks;Right lower leg (including foot);Left upper leg;Right upper leg;Left lower leg (including foot);Chest Bathing: 5: Set-up assist to:  Obtain items  FIM - Upper Body Dressing/Undressing Upper body dressing/undressing steps patient completed: Thread/unthread right sleeve of pullover shirt/dresss;Thread/unthread left sleeve of pullover shirt/dress;Put head through opening of pull over shirt/dress;Pull shirt over trunk Upper body dressing/undressing: 5: Set-up assist to: Apply TLSO, cervical collar FIM - Lower Body Dressing/Undressing Lower body dressing/undressing steps patient completed: Thread/unthread right underwear leg;Thread/unthread left underwear leg;Thread/unthread right pants leg;Pull underwear up/down;Thread/unthread left pants leg;Pull pants up/down;Fasten/unfasten pants;Don/Doff right shoe;Don/Doff left shoe;Fasten/unfasten right shoe;Fasten/unfasten left shoe Lower body dressing/undressing: 6: More than reasonable amount of time  FIM - Toileting Toileting steps completed  by patient: Adjust clothing prior to toileting;Adjust clothing after toileting;Performs perineal hygiene Toileting Assistive Devices: Grab bar or rail for support Toileting: 6: More than reasonable amount of time  FIM - Diplomatic Services operational officer Devices: Art gallery manager Transfers: 5-To toilet/BSC: Supervision (verbal cues/safety issues);5-From toilet/BSC: Supervision (verbal cues/safety issues)  FIM - Bed/Chair Transfer Bed/Chair Transfer Assistive Devices: Bed rails;Arm rests;Orthosis;HOB elevated Bed/Chair Transfer: 7: Supine > Sit: No assist;5: Bed > Chair or W/C: Supervision (verbal cues/safety issues)  FIM - Locomotion: Wheelchair Locomotion: Wheelchair: 5: Travels 150 ft or more: maneuvers on rugs and over door sills with supervision, cueing or coaxing FIM - Locomotion: Ambulation Locomotion: Ambulation Assistive Devices: Designer, industrial/product Ambulation/Gait Assistance: 5: Supervision Locomotion: Ambulation: 2: Travels 50 - 149 ft with supervision/safety issues  Comprehension Comprehension Mode: Auditory Comprehension: 6-Follows complex conversation/direction: With extra time/assistive device  Expression Expression Mode: Verbal Expression: 6-Expresses complex ideas: With extra time/assistive device  Social Interaction Social Interaction: 6-Interacts appropriately with others with medication or extra time (anti-anxiety, antidepressant).  Problem Solving Problem Solving: 5-Solves complex 90% of the time/cues < 10% of the time  Memory Memory: 6-More than reasonable amt of time  2. Anticoagulation/DVT prophylaxis with Pharmaceutical: Coumadin Medical Problem List and Plan:  1. Lumbar L2 compression fracture with osteoporosis. Conservative care with a back brace  2. DVT Prophylaxis/Anticoagulation: Chronic Coumadin therapy for history of left ventricular mural thrombus. Monitor for any signs of bleeding  3. Pain Management: D/CDuragesic patch Robaxin as needed.  Vicodin as needed ,tramadol for mild pain 4. Hypertension. Amiodarone 200 mg daily, Coreg 6.25 mg twice a day, Lasix 20 mg daily, lisinopril 2.5 mg twice daily. Monitor with increased mobility  5. Hypothyroidism. Synthroid  Hyperlipidemia. zetia  6. Chronic renal insufficiency. Baseline creatinine 1.2-1.5. Strict I&O  7. Anxiety/depression. BuSpar as needed  8 diverticulitis. Protonix  9. Chronic left lumbosacral radiculopathy 10.  Bradycardia due to beta blocker will reduce dose and monitor HR,  change to 3.125mg  coreg 11.  Nasuea- likely med related improved LOS (Days) 6 A FACE TO FACE EVALUATION WAS PERFORMED  [name] 07/14/2011, 2:14 PM

## 2011-07-14 NOTE — Progress Notes (Signed)
Recreational Therapy Discharge Summary Patient Details  Name: [name] MRN: [id number] Date of Birth: [birth year] Today's Date: 07/14/2011  Long term goals set: 1  Long term goals met: 1 Comments on progress toward goals:Pt met Mod I level for seated TR tasks and supervision for standing activities.  Pt set for discharge 07/16/11 at Mod I level.  Pt requires extra time and use of AE to safely complete tasks.   Reasons goals not met: n/a  Equipment acquired: n/a  Reasons for discharge: discharge from hospital  Patient/family agrees with progress made and goals achieved: Yes  Kiaja Shorty 07/14/2011, 5:12 PM

## 2011-07-14 NOTE — Progress Notes (Signed)
Physical Therapy Note  Patient Details  Name: MARCELLA CHARLSON MRN: [id number] Date of Birth: 28-Mar-1950 Today's Date: 07/14/2011  1500-1545 (45 minutes) individual Pain: no complaint of pain Focus of treatment: Gait training with RW focusing on increased endurance/distance; therapeutic exercises for general LE strengthening Treatment: Gait to/from room to gym SBA with RW with 2 episodes of instability secondary to LT toe drag; Nustep (ergonometer) X 10 minutes Level 3 LEs only; Discussed AFO vs toe up brace with patient and Orthotist with patient performing ambulation X 80 feet with either. Pt reports she prefers AFO due to ease of donning to various shoes. Orthotist to return this PM for fitting.   Pang Robers,JIM 07/14/2011, 4:41 PM

## 2011-07-14 NOTE — Progress Notes (Signed)
Physical Therapy Note  Patient Details  Name: [name] MRN: [id number] Date of Birth: June 05, 1950 Today's Date: 07/14/2011  1100-1155 (55 minutes) individual Pain; 4/10 meds given  BP (sitting) 126/76  Pulse 48 Focus of treatment: Therapeutic activities to facilitate increased standing tolerance  Without  AD; gait training/endurance with RW Treatment: wc mobility; 120 feet SBA to mod I on level tile; standing tolerance-  Wii bowling 9 minutes, 10 minutes SBA without AD; gait gym to room 120 feet SBA RW   Mannie Wineland,JIM 07/14/2011, 11:55 AM

## 2011-07-14 NOTE — Progress Notes (Signed)
Occupational Therapy Session Note  Patient Details  Name: [name] MRN: [id number] Date of Birth: [birth year]  Today's Date: 07/14/2011 Time: 1000-1100 Time Calculation (min): 60 min  Short Term Goals: Week 1:  OT Short Term Goal 1 (Week 1): STG=LTG  Skilled Therapeutic Interventions/Progress Updates:   Pt seen for BADL retraining of toileting, bathing, and dressing with a focus on patient completing tasks as close to a mod I level as possible to prepare for discharge to home on Saturday.  Pt gathered her own clothing and completed all transfers with supervision.  She was mod I with BADLs except for min assist with donning TLSO.  Pt stated that she felt much more energetic and in much less pain and she felt ready to go home on Saturday.     Therapy Documentation Precautions:  Precautions Precautions: Fall Precaution Booklet Issued: No (already has booklet) Precaution Comments: Reviewed back precautions Required Braces or Orthoses: Spinal Brace Spinal Brace: Thoracolumbosacral orthotic Restrictions Weight Bearing Restrictions: No Pain: Pain Assessment Pain Assessment: 0-10 Pain Score:   4 Pain Type: Acute pain Pain Location: Back Pain Orientation: Lower Pain Descriptors: Aching Pain Frequency: Intermittent Pain Onset: Gradual Patients Stated Pain Goal: 3 Pain Intervention(s): Medication (See eMAR);Repositioned Multiple Pain Sites: No ASee FIM for current functional status  Therapy/Group: Individual Therapy  [name] 07/14/2011, 12:24 PM

## 2011-07-14 NOTE — Progress Notes (Addendum)
Patient back from therapy session in gym, called to room by Nursing tech, pt with emesis, undigested food. Approximately 200 cc,patient states" better now that threw up,it is all the movement with therapy" Deatra Ina, PA made aware of emesis, no new orders given at this time. Will continue to monitor. Roberts-VonCannon, Aedyn Mckeon Elon Jester

## 2011-07-14 NOTE — Progress Notes (Signed)
Occupational Therapy Session Note  Patient Details  Name: [name] MRN: [id number] Date of Birth: Aug 19, 1950  Today's Date: 07/14/2011 Time: 0865-7846 Time Calculation (min): 27 min   Skilled Therapeutic Interventions/Progress Updates:    Practiced toilet transfers and toileting using her RW at beginning of session.  Pt able to perform with close supervision using the RW and 3"1 over the toilet.  Pt performed the transfer without use of her brace.  After toileting ambulated to the ADL apartment to work on shower/tub transfers.  Pt able to step over the edge of the tub with supervision.  She describes her current shower seat as a small one in which she will have to step over the edge of the tub to use.  Feel at this point she will likely need supervision for this but her current DME will be adequate.  Ambulated back to the room at conclusion of this task.    Therapy Documentation Precautions:  Precautions Precautions: Fall Precaution Booklet Issued: No (already has booklet) Precaution Comments: Reviewed back precautions Required Braces or Orthoses: Spinal Brace Spinal Brace: Thoracolumbosacral orthotic Restrictions Weight Bearing Restrictions: No  Pain: Pain Assessment Pain Assessment: 0-10 Pain Score:   2 Pain Type: Acute pain Pain Location: Back  See FIM for current functional status  Therapy/Group: Individual Therapy  Treyshaun Keatts 07/14/2011, 3:48 PM

## 2011-07-15 LAB — PROTIME-INR: Prothrombin Time: 26 seconds — ABNORMAL HIGH (ref 11.6–15.2)

## 2011-07-15 MED ORDER — WARFARIN SODIUM 2 MG PO TABS
2.0000 mg | ORAL_TABLET | Freq: Once | ORAL | Status: AC
  Administered 2011-07-15: 2 mg via ORAL
  Filled 2011-07-15: qty 1

## 2011-07-15 MED ORDER — RALOXIFENE HCL 60 MG PO TABS: 60.0000 mg | ORAL_TABLET | Freq: Every day | ORAL | Status: AC

## 2011-07-15 MED ORDER — FUROSEMIDE 20 MG PO TABS: 20.0000 mg | ORAL_TABLET | Freq: Every day | ORAL | Status: DC

## 2011-07-15 MED ORDER — CIPROFLOXACIN HCL 250 MG PO TABS
250.0000 mg | ORAL_TABLET | Freq: Two times a day (BID) | ORAL | Status: DC
  Administered 2011-07-15 – 2011-07-16 (×2): 250 mg via ORAL
  Filled 2011-07-15 (×4): qty 1

## 2011-07-15 MED ORDER — CALCIUM CARBONATE-VITAMIN D 500-200 MG-UNIT PO TABS: 1.0000 | ORAL_TABLET | Freq: Two times a day (BID) | ORAL | Status: DC

## 2011-07-15 MED ORDER — SENNA 8.6 MG PO TABS: 2.0000 | ORAL_TABLET | Freq: Every day | ORAL | Status: DC

## 2011-07-15 MED ORDER — LEVOTHYROXINE SODIUM 175 MCG PO TABS: 175.0000 ug | ORAL_TABLET | Freq: Every day | ORAL | Status: DC

## 2011-07-15 NOTE — Progress Notes (Signed)
ANTICOAGULATION CONSULT NOTE - Follow Up Consult  Pharmacy Consult:  Coumadin Indication: atrial fibrillation and h/o mural thrombus  Allergies  Allergen Reactions  . Penicillins Swelling    Throat swells  . Prevacid Nausea Only  . Statins Other (See Comments)    cramps    Patient Measurements: Weight: 160 lb 0.9 oz (72.6 kg)  Vital Signs: Temp: 97.9 F (36.6 C) (04/26 0500) Temp src: Oral (04/26 0500) BP: 124/77 mmHg (04/26 0500) Pulse Rate: 72  (04/26 0926)  Labs:  Alvira Philips 07/15/11 4098 07/14/11 0532 07/13/11 0500  HGB -- -- --  HCT -- -- --  PLT -- -- --  APTT -- -- --  LABPROT 26.0* 22.5* 20.4*  INR 2.34* 1.94* 1.71*  HEPARINUNFRC -- -- --  CREATININE -- -- --  CKTOTAL -- -- --  CKMB -- -- --  TROPONINI -- -- --   The CrCl is unknown because both a height and weight (above a minimum accepted value) are required for this calculation.    Assessment: [name] on Coumadin for chronic Afib.  INR therapeutic.  Protime increased by 3.5 sec, no bleeding noted.  Was on Coumadin 3mg  PO daily PTA.   Goal of Therapy:  INR 2-3    Plan:  - Coumadin2mg  PO today - Daily INR  Talbert Cage, PharmD Pager:  [phone] 07/15/2011, 9:39 AM

## 2011-07-15 NOTE — Progress Notes (Signed)
Social Work Discharge Note Discharge Note  The overall goal for the admission was met for:   Discharge location: Yes-HOME WITH BROTHER CHECKING ON HER  Length of Stay: Yes-8 DAYS  Discharge activity level: Yes-MOD/I LEVEL  Home/community participation: Yes  Services provided included: MD, RD, PT, OT, RN, CM, TR, Pharmacy and SW  Financial Services: Medicare and Private Insurance: [employer]  Follow-up services arranged: Home Health: Fatima Blank, OT, RN, AIDE, DME: [facility], WHEELCHAIR and Patient/Family request agency HH: [facility] USED BEFORE, DME: [facility]  Comments (or additional information):HAS BEEN HERE SEVERAL TIMES AND KNOWS THE PROCESS-FEELS READY TO GO HOME  Patient/Family verbalized understanding of follow-up arrangements: Yes  Individual responsible for coordination of the follow-up plan: YOUNG-BROTHER  Confirmed correct DME delivered: Lucy Chris 07/15/2011    Lucy Chris

## 2011-07-15 NOTE — Plan of Care (Signed)
Problem: RH KNOWLEDGE DEFICIT Goal: RH STG INCREASE KNOWLEDGE OF HYPERTENSION Mod assist  Outcome: Progressing Pt aware of blood pressure being low and Coreg d/c'd. Verbalizing understanding of importance of regular follow up with primary MD

## 2011-07-15 NOTE — Plan of Care (Signed)
Problem: SCI BLADDER ELIMINATION Goal: RH STG MANAGE BLADDER WITH ASSISTANCE STG Manage Bladder With Assistance mod assist  Outcome: Progressing Pt continent of bladder today, toileting every 3 hrs and prn, depends for clothes protection, up to BR

## 2011-07-15 NOTE — Plan of Care (Signed)
Problem: RH SAFETY Goal: RH STG DEMO UNDERSTANDING HOME SAFETY PRECAUTIONS Min assist  Outcome: Progressing Per pt will have family checking in on her, denies any question /concerns

## 2011-07-15 NOTE — Discharge Instructions (Signed)
Inpatient Rehab Discharge Instructions  [name] Discharge date and time: No discharge date for patient encounter.   Activities/Precautions/ Functional Status: Activity: activity as tolerated Diet: regular diet Wound Care: none needed Functional status:  ___ No restrictions     ___ Walk up steps independently __x_ 24/7 supervision/assistance   ___ Walk up steps with assistance ___ Intermittent supervision/assistance  ___ Bathe/dress independently ___ Walk with walker     ___ Bathe/dress with assistance ___ Walk Independently    ___ Shower independently __x_ Walk with assistance    ___ Shower with assistance ___ No alcohol     ___ Return to work/school ________  Special Instructions: Back brace when out of bed Home health nurse to check INR Monday, April 29 results to the [facility] [phone] fax [phone]   COMMUNITY REFERRALS UPON DISCHARGE:    Home Health:   PT, OT, RN, AIDE    Agency: [facility] Phone: [phone] Date of last service:07/15/2011   Medical Equipment/Items Ordered:HOSPITAL BED  Agency/Supplier:[facility]  [phone]  IF QUESTIONS OR CONCERNS ARISE, PLEASE CONTACT:  Dossie Der, LCSW                                                                                                               [phone]    My questions have been answered and I understand these instructions. I will adhere to these goals and the provided educational materials after my discharge from the hospital.  Patient/Caregiver Signature _______________________________ Date __________  Clinician Signature _______________________________________ Date __________  Please bring this form and your medication list with you to all your follow-up doctor's appointments.

## 2011-07-15 NOTE — Progress Notes (Signed)
Physical Therapy Session Note  Patient Details  Name: [name] MRN: [id number] Date of Birth: 05/02/1950  Today's Date: 07/15/2011 Time: 0905-1000 Time Calculation (min): 55 min  Short Term Goals: See d/c goals, overall mod I  Skilled Therapeutic Interventions/Progress Updates:    Gait training, activity tolerance, pt requests frequent rest breaks d/t decreased endurance. .Patient at increased fall risk as noted by decreased gait velocity.  (increased fall risk with velocity less than 1.8 ft/sec). Patient current velocity is  1.7 ft /sec, neighborhood ambulator.  Pt also with increase difficulty with cognitive task during mobility and increased risk of falls during dual task conditions discussed as was safety in the home and wearing schedule and skin inspection for new AFO (that has not arrived yet) Therapy Documentation Precautions:  Precautions Precautions: Back Precaution Booklet Issued: No (already has booklet) Precaution Comments: Reviewed back precautions Required Braces or Orthoses: Spinal Brace Spinal Brace: Thoracolumbosacral orthotic (with gait) Restrictions Weight Bearing Restrictions: No    Vital Signs: Therapy Vitals Pulse Rate: 72  (with activity) Pain: Pain Assessment Pain Assessment: 0-10 Pain Score:   4 Pain Location: Back Pain Intervention(s):  (premedicated, declined need for further intervnetion)    Locomotion : Ambulation Ambulation/Gait Assistance: 6: Modified independent (Device/Increase time) Ambulation Distance (Feet): 150 Feet Assistive device: Rolling walker Ambulation/Gait Assistance Details: TLSO, steady step thru pattern Gait Gait velocity: 1.7 ft/sec, 19.2 10 meter walk test Stairs / Additional Locomotion Stairs: Yes Stairs Assistance: 6: Modified independent (Device/Increase time) Stair Management Technique: One rail Left Number of Stairs: 12  Curb: 6: Modified independent (Device/increase time) (S first trial d/t cue to step  closer to curb before lifingRW)  See FIM for current functional status  Therapy/Group: Individual Therapy  Michaelene Song 07/15/2011, 9:53 AM

## 2011-07-15 NOTE — Discharge Summary (Signed)
Occupational Therapy Skilled Therapy Intervention and Discharge Summary   Patient Details  Name: [name] MRN: [id number] Date of Birth: May 23, 1950  Today's Date: 07/15/2011 Time: 1005-1100 Time Calculation (min): 55 min  Skilled Therapy Intervention:    Pt seen for BADL retraining of toileting, bathing, and dressing with a focus on pt completing tasks at a mod I level using back precautions.  Pt continues to need supervision with tub transfers.  She will only bathe when her home care aide is there to assist her.  Pt demonstrated good balance and safety awareness.  Pt states she is feeling "better all over" and is ready for d/c tomorrow am.  Patient has met 8 of 8 long term goals due to improved activity tolerance and improved balance.  Patient to discharge at overall Modified Independent level.  Patient's care partner [facility])  is independent to provide the necessary physical assistance at discharge with tub bench transfers.    Reasons goals not met: not applicable  Recommendation:  Patient will benefit from ongoing skilled OT services in home health setting to continue to advance functional skills in the area of iADL.  Equipment: No equipment provided  Reasons for discharge: treatment goals met  Patient/family agrees with progress made and goals achieved: Yes  OT Discharge Precautions/Restrictions  Precautions Precautions: Back Spinal Brace: Thoracolumbosacral orthotic (with gait)  Pain Pain Assessment Pain Assessment: 0-10 Pain Score:   4 Pain Location: Back Pain Orientation: Lower Pain Intervention(s):  (premedicated, declined need for further intervnetion) ADL ADL Eating: Independent Grooming: Independent Upper Body Bathing: Independent Where Assessed-Upper Body Bathing: Shower Lower Body Bathing: Modified independent Where Assessed-Lower Body Bathing: Shower Upper Body Dressing: Modified independent (Device) (Able to don her TLSO with extra  time) Lower Body Dressing: Modified independent Toileting: Modified independent Where Assessed-Toileting: Teacher, adult education: Engineer, agricultural Method: Proofreader: Gaffer: Close supervison Web designer Method: Ship broker: Information systems manager with back Vision/Perception  Vision - History Baseline Vision: Wears glasses for distance only Patient Visual Report: No change from baseline Vision - Assessment Eye Alignment: Within Functional Limits Perception Perception: Within Functional Limits Praxis Praxis: Intact  Cognition Overall Cognitive Status: Appears within functional limits for tasks assessed Sensation Sensation Stereognosis: Appears Intact Hot/Cold: Appears Intact Coordination Gross Motor Movements are Fluid and Coordinated: Yes Fine Motor Movements are Fluid and Coordinated: Yes Motor  Motor Motor: Within Functional Limits Mobility  Transfers Stand to Sit: 6: Modified independent (Device/Increase time)  Balance Static Standing Balance Static Standing - Level of Assistance: 6: Modified independent (Device/Increase time) Extremity/Trunk Assessment RUE Assessment RUE Assessment: Within Functional Limits LUE Assessment LUE Assessment: Within Functional Limits  See FIM for current functional status  SAGUIER,JULIA 07/15/2011, 11:54 AM

## 2011-07-15 NOTE — Plan of Care (Signed)
Problem: SCI BOWEL ELIMINATION Goal: RH STG MANAGE BOWEL W/EQUIPMENT W/ASSISTANCE STG Manage Bowel With Equipment With Assistance min assist  Outcome: Progressing Pt ambulating to BR with RW and TLSO brace on or to BR via wheelchair,depends in use for protection of clothes

## 2011-07-15 NOTE — Plan of Care (Signed)
Problem: SCI BLADDER ELIMINATION Goal: RH STG MANAGE BLADDER WITH MEDICATION WITH ASSISTANCE STG Manage Bladder With Medication With Assistance min assist  Outcome: Not Applicable Date Met:  07/15/11 Pt is not on any medication at this time

## 2011-07-15 NOTE — Plan of Care (Signed)
Problem: RH SAFETY Goal: RH STG ADHERE TO SAFETY PRECAUTIONS W/ASSISTANCE/DEVICE STG Adhere to Safety Precautions With Assistance/Device min assist  Outcome: Progressing Pt up with RW and TLSO brace on

## 2011-07-15 NOTE — Plan of Care (Signed)
Problem: RH PAIN MANAGEMENT Goal: RH STG PAIN MANAGED AT OR BELOW PT'S PAIN GOAL Min assist , pain will be 3 or less on scale of 1-10.  Outcome: Progressing Duragesic patch d/c'd on 07-13-11.  Scheduled ultram 50 mg po four times a day Norco 2 tabs po every 4 hrs as needed

## 2011-07-15 NOTE — Progress Notes (Signed)
Patient ID: [name], female   DOB: 08/12/1950, 61 y.o.   MRN: [id number]  Subjective/Complaints: Nausea and vomiting improved Pain control is ok  Review of Systems  Gastrointestinal: Positive for nausea and vomiting. Negative for abdominal pain.  Neurological: Positive for dizziness.       When standing up  All other systems reviewed and are negative.   Objective: Vital Signs: Blood pressure 124/77, pulse 52, temperature 97.9 F (36.6 C), temperature source Oral, resp. rate 18, weight 72.6 kg (160 lb 0.9 oz), SpO2 100.00%. No results found. Results for orders placed during the hospital encounter of 07/08/11 (from the past 72 hour(s))  PROTIME-INR     Status: Abnormal   Collection Time   07/13/11  5:00 AM      Component Value Range Comment   Prothrombin Time 20.4 (*) 11.6 - 15.2 (seconds)    INR 1.71 (*) 0.00 - 1.49    URINALYSIS, ROUTINE W REFLEX MICROSCOPIC     Status: Abnormal   Collection Time   07/13/11  1:53 PM      Component Value Range Comment   Color, Urine YELLOW  YELLOW     APPearance CLOUDY (*) CLEAR     Specific Gravity, Urine 1.012  1.005 - 1.030     pH 6.0  5.0 - 8.0     Glucose, UA NEGATIVE  NEGATIVE (mg/dL)    Hgb urine dipstick SMALL (*) NEGATIVE     Bilirubin Urine NEGATIVE  NEGATIVE     Ketones, ur NEGATIVE  NEGATIVE (mg/dL)    Protein, ur NEGATIVE  NEGATIVE (mg/dL)    Urobilinogen, UA 1.0  0.0 - 1.0 (mg/dL)    Nitrite NEGATIVE  NEGATIVE     Leukocytes, UA MODERATE (*) NEGATIVE    URINE MICROSCOPIC-ADD ON     Status: Abnormal   Collection Time   07/13/11  1:53 PM      Component Value Range Comment   Squamous Epithelial / LPF MANY (*) RARE     WBC, UA 3-6  <3 (WBC/hpf)    RBC / HPF 0-2  <3 (RBC/hpf)    Bacteria, UA FEW (*) RARE     Urine-Other AMORPHOUS URATES/PHOSPHATES     URINE CULTURE     Status: Normal (Preliminary result)   Collection Time   07/13/11  1:54 PM      Component Value Range Comment   Specimen Description URINE, CLEAN CATCH        Special Requests NONE      Culture  Setup Time [id number]      Colony Count PENDING      Culture Culture reincubated for better growth      Report Status PENDING     PROTIME-INR     Status: Abnormal   Collection Time   07/14/11  5:32 AM      Component Value Range Comment   Prothrombin Time 22.5 (*) 11.6 - 15.2 (seconds)    INR 1.94 (*) 0.00 - 1.49    PROTIME-INR     Status: Abnormal   Collection Time   07/15/11  6:38 AM      Component Value Range Comment   Prothrombin Time 26.0 (*) 11.6 - 15.2 (seconds)    INR 2.34 (*) 0.00 - 1.49      GEN; NAD Mood/affect anxious Lungs Clear Cor:  Bradycardia, no murmur Abd:  +BS, soft,no tenderness Ext: no C/C/E Motor 4/5 in BUE and BLE except 3/5 in L ankle dorsiflexor Gait- rolling walker,  toe drag with distance walking, SBA  Assessment/Plan: 1. Functional deficits secondary to Deconditioning which require 3+ hours per day of interdisciplinary therapy in a comprehensive inpatient rehab setting. Plan D/C in am after MD rounds. FIM: FIM - Bathing Bathing Steps Patient Completed: Right Arm;Left Arm;Abdomen;Front perineal area;Buttocks;Right lower leg (including foot);Left upper leg;Right upper leg;Left lower leg (including foot);Chest Bathing: 5: Set-up assist to: Obtain items  FIM - Upper Body Dressing/Undressing Upper body dressing/undressing steps patient completed: Thread/unthread right sleeve of pullover shirt/dresss;Thread/unthread left sleeve of pullover shirt/dress;Put head through opening of pull over shirt/dress;Pull shirt over trunk Upper body dressing/undressing: 5: Set-up assist to: Apply TLSO, cervical collar FIM - Lower Body Dressing/Undressing Lower body dressing/undressing steps patient completed: Thread/unthread right underwear leg;Thread/unthread left underwear leg;Thread/unthread right pants leg;Pull underwear up/down;Thread/unthread left pants leg;Pull pants up/down;Fasten/unfasten pants;Don/Doff right shoe;Don/Doff  left shoe;Fasten/unfasten right shoe;Fasten/unfasten left shoe Lower body dressing/undressing: 6: More than reasonable amount of time  FIM - Toileting Toileting steps completed by patient: Adjust clothing prior to toileting;Performs perineal hygiene;Adjust clothing after toileting Toileting Assistive Devices: Grab bar or rail for support Toileting: 5: Supervision: Safety issues/verbal cues  FIM - Diplomatic Services operational officer Devices: Building control surveyor Transfers: 5-To toilet/BSC: Supervision (verbal cues/safety issues)  FIM - Banker Devices: Therapist, occupational: 5: Bed > Chair or W/C: Supervision (verbal cues/safety issues)  FIM - Locomotion: Wheelchair Locomotion: Wheelchair: 5: Travels 150 ft or more: maneuvers on rugs and over door sills with supervision, cueing or coaxing FIM - Locomotion: Ambulation Locomotion: Ambulation Assistive Devices: Designer, industrial/product Ambulation/Gait Assistance: 5: Supervision Locomotion: Ambulation: 2: Travels 50 - 149 ft with supervision/safety issues  Comprehension Comprehension Mode: Auditory Comprehension: 6-Follows complex conversation/direction: With extra time/assistive device  Expression Expression Mode: Verbal Expression: 6-Expresses complex ideas: With extra time/assistive device  Social Interaction Social Interaction: 6-Interacts appropriately with others with medication or extra time (anti-anxiety, antidepressant).  Problem Solving Problem Solving: 5-Solves complex 90% of the time/cues < 10% of the time  Memory Memory: 6-More than reasonable amt of time  2. Anticoagulation/DVT prophylaxis with Pharmaceutical: Coumadin Medical Problem List and Plan:  1. Lumbar L2 compression fracture with osteoporosis. Conservative care with a back brace  2. DVT Prophylaxis/Anticoagulation: Chronic Coumadin therapy for history of left ventricular mural thrombus. Monitor for any  signs of bleeding  3. Pain Management: D/CDuragesic patch Robaxin as needed. Vicodin as needed ,tramadol for mild pain 4. Hypertension. Amiodarone 200 mg daily, Coreg 6.25 mg twice a day, Lasix 20 mg daily, lisinopril 2.5 mg twice daily. Monitor with increased mobility  5. Hypothyroidism. Synthroid  Hyperlipidemia. zetia  6. Chronic renal insufficiency. Baseline creatinine 1.2-1.5. Strict I&O  7. Anxiety/depression. BuSpar as needed  8 diverticulitis. Protonix  9. Chronic left lumbosacral radiculopathy 10.  Bradycardia due to beta blocker will reduce dose and monitor HR,  change to 3.125mg  coreg 11.  Nasuea- likely med related improved LOS (Days) 7 A FACE TO FACE EVALUATION WAS PERFORMED  Vergene Marland E 07/15/2011, 7:58 AM

## 2011-07-15 NOTE — Progress Notes (Signed)
Physical Therapy Discharge Summary and Session Note  Patient Details  Name: [name] MRN: [id number] Date of Birth: 1951-02-28  Today's Date: 07/15/2011 Time: 1345-1430 Time Calculation (min): 45 min  Patient has met 6 of 6 long term goals due to improved activity tolerance, improved balance, improved postural control, increased strength, decreased pain and ability to compensate for deficits.  Patient to discharge at an ambulatory level Modified Independent.   Reasons goals not met: n/a  Recommendation:  Patient will benefit from ongoing skilled PT services in home setting to continue to advance safe functional mobility, address ongoing impairments in strength, activity tolerance, balance, and minimize fall risk.  Equipment: w/c;  custom L AFO from [employer]; pt already owned RW  Reasons for discharge: treatment goals met  Patient/family agrees with progress made and goals achieved: Yes  PT Discharge Precautions/Restrictions Precautions Precautions: Fall;Back Precaution Comments: pt able to demonstrate and state precautions Vital Signs Therapy Vitals Temp: 98 F (36.7 C) Temp src: Oral Pulse Rate: 49  Resp: 18  BP: 105/69 mmHg Patient Position, if appropriate: Sitting Oxygen Therapy SpO2: 99 % O2 Device: None (Room air) Pain Pain Assessment Pain Assessment: 0-10 Pain Score:   5 Pain Type: Acute pain Pain Location: Back Pain Orientation: Lower Pain Descriptors: Aching Pain Frequency: Intermittent Pain Onset: Gradual Patients Stated Pain Goal: 3 Pain Intervention(s): Medication (See eMAR) Multiple Pain Sites: No    Cognition Overall Cognitive Status: Appears within functional limits for tasks assessed Sensation Sensation Additional Comments: L foot impaired; not specifically tested Motor  Motor Motor: Motor perseverations (L foot drop when fatigued from ambulation)  Mobility Transfers Stand to Sit: 6: Modified independent (Device/Increase  time) Stand Pivot Transfers: 6: Modified independent (Device/Increase time) Locomotion  Ambulation Ambulation/Gait Assistance: 6: Modified independent (Device/Increase time) Assistive device: Rolling walker Ambulation/Gait Assistance Details: L AFO Gait Gait: Yes Gait Pattern: Impaired Gait Pattern: Left steppage;Trunk flexed Gait velocity: 1.7 ft/sec, 19.2 10 meter walk test Stairs / Additional Locomotion Stairs: Yes Stairs Assistance: 6: Modified independent (Device/Increase time) Stair Management Technique: One rail Left  Trunk/Postural Assessment  Cervical Assessment Cervical Assessment: Within Functional Limits Thoracic Assessment Thoracic Assessment: Within Functional Limits Lumbar Assessment Lumbar Assessment: Exceptions to WFL (due to fx; TLSO) Postural Control Postural Control: Deficits on evaluation  Balance Static Standing Balance Static Standing - Balance Support: Bilateral upper extremity supported Static Standing - Level of Assistance: 6: Modified independent (Device/Increase time) Extremity Assessment      RLE Assessment RLE Assessment: Within Functional Limits LLE Assessment LLE Assessment: Within Functional Limits (L foot drop when fatigued)  See FIM for current functional status  PM Treatment today: community gait training with RW, TLSO,  ACE on LLE as AFO has not been delivered yet, x 150', up/down 6 steps with 1 rail, up/down slopes, sitting on bench.  Pt safely modified independent for all.    Later in afternoon, AFO was delivered- posterior leaf spring.  Pt verbalized instructions on skin checks often when wearing AFO.    Inigo Lantigua 07/15/2011, 5:16 PM

## 2011-07-15 NOTE — Plan of Care (Signed)
Problem: RH SKIN INTEGRITY Goal: RH STG MAINTAIN SKIN INTEGRITY WITH ASSISTANCE STG Maintain Skin Integrity With Assistance min assist  Outcome: Completed/Met Date Met:  07/15/11 Pt states"family can help as needed, no breakdown

## 2011-07-15 NOTE — Plan of Care (Signed)
Problem: RH KNOWLEDGE DEFICIT Goal: RH STG INCREASE KNOWLEDGE OF HYPERTENSION Mod assist  Outcome: Progressing Prinivil also d/c'd on 07-13-11

## 2011-07-15 NOTE — Progress Notes (Signed)
Occupational Therapy Session Note  Patient Details  Name: NATORIA ARCHIBALD MRN: [id number] Date of Birth: [birth year]  Today's Date: 07/15/2011 Time: 4098-1191 Time Calculation (min): 29 min  Short Term Goals: Week 1:  OT Short Term Goal 1 (Week 1): STG=LTG  Skilled Therapeutic Interventions/Progress Updates:  Upon entering the room patient being fitted with her new AFO and eager to get on her feet to try it out.  Ambulated to therapy apartment to practice tub transfers ans simple kitchen tasks.  Patient fatigues quickly and takes a rest when needed.  Focus session on walker safety and maintaining her back precautions with all functional mobility.  Therapy Documentation Precautions:  Precautions: Back Precaution Booklet Issued: No (already has booklet) Precaution Comments: Reviewed back precautions Required Braces or Orthoses: Spinal Brace Spinal Brace: Thoracolumbosacral orthotic (with gait) Restrictions Weight Bearing Restrictions: No Pain: Pain Assessment Pain Assessment: 0-10 Pain Score:   6 Pain Type: Acute pain Pain Location: Back Pain Orientation: Lower Pain Descriptors: Aching Pain Frequency: Intermittent Pain Onset: Gradual Patients Stated Pain Goal: 3 Pain Intervention(s): Medication (See eMAR);Repositioned Multiple Pain Sites: No  See FIM for current functional status  Therapy/Group: Individual Therapy  Natalie Leclaire 07/15/2011, 4:39 PM

## 2011-07-15 NOTE — Plan of Care (Signed)
Problem: RH SKIN INTEGRITY Goal: RH STG SKIN FREE OF INFECTION/BREAKDOWN Min assist  Outcome: Completed/Met Date Met:  07/15/11 No breakdown noted on assessment

## 2011-07-15 NOTE — Discharge Summary (Signed)
[name], [name] NO.:  [id number]  MEDICAL RECORD NO.:  [id number]  LOCATION:  4140                         FACILITY:  [facility]  PHYSICIAN:  Erick Colace, M.D.DATE OF BIRTH:  May 18, 1950  DATE OF ADMISSION:  07/08/2011 DATE OF DISCHARGE:                              DISCHARGE SUMMARY   DISCHARGE DIAGNOSES: 1. Lumbar L2 compression fracture with osteoporosis. 2. 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 3. Pain management. 4. Hypertension. 5. Hypothyroidism. 6. Chronic renal insufficiency with creatinine 1.5. 7. Anxiety. 8. Depression. 9. Diverticulitis. 10.Chronic left lumbosacral radiculopathy. 11.e coli uti  This is a 61 year old right-handed [place] American female, well known to rehab services with history of multiple back surgeries.  Last rehabilitation admission was December, 2011.  Presented April 16th when she noted severe low back pain with bending over limiting her mobility. X-rays and imaging revealed mild lumbar L2 compression fracture.  She was seen by Neurosurgery, advised conservative care.  She was placed in a TLSO brace that she had from previous surgeries.  Pain management ongoing.  Chronic Coumadin therapy for history of left ventricular mural thrombus with no bleeding episodes.  She was admitted for comprehensive rehab program.  PAST MEDICAL HISTORY:  See discharge diagnoses.  ALLERGIES:  PENICILLIN, PREVACID, and STATINS.  SOCIAL HISTORY:  Lives alone, 1 level home, 2 steps to entry.  FUNCTIONAL HISTORY:  Prior to admission was independent, driving, retired.  FUNCTIONAL STATUS:  Upon admission to rehab service was minimal assist to roll to the right, minimal assist to supine to sit, moderate assist sit to supine.  PHYSICAL EXAMINATION:  VITAL SIGNS:  Blood pressure 101/63, pulse 50, temperature 97.6, respirations 20. GENERAL:  This was an alert female, in no acute distress, oriented x3. HEAD:   Normocephalic. LUNGS:  Clear to auscultation. CARDIAC:  Regular rate and rhythm. ABDOMEN:  Soft and nontender.  Good bowel sounds.  Motor strength 4/5 bilaterally, hip flexors, knee extensors, ankle dorsiflexors.  Motor strength 5/5 bilateral deltoid, biceps, triceps, and grip.  Sensory exam showed decreased left L4-L5 and S1 dermatomal distribution sensory loss.  REHABILITATION HOSPITAL COURSE:  The patient was admitted to inpatient rehab services with therapies initiated on a 3-hour daily basis consisting of physical therapy, occupational therapy, and rehabilitation nursing.  The following issues were addressed during the patient's rehabilitation stay.  Pertaining to Ms. Gioffre's lumbar L2 compression fracture with osteoporosis, conservative care per Neurosurgery Dr. Phoebe Perch.  She was using Robaxin as needed for muscle spasms, Ultram 50 mg every 6 hours as needed.  She had been using a Duragesic patch, this had since been discontinued as her pain control was much improved.  She remained on chronic Coumadin therapy for history of left ventricular mural thrombus.  Latest INR of 1.94.  She would follow up with [employer].  Her blood pressures remained well controlled on amiodarone, Lasix, with no orthostatic changes and monitored with increased mobility.  She did have a history of anxiety, depression.  She was using BuSpar 15 mg daily as needed.  Hypothyroidism with Synthroid as advised. The patient received weekly collaborative interdisciplinary team conferences to discuss estimated length of stay, family teaching, and any  barriers to her discharge.  She had occasional incontinence of bladder, supervision with activities of daily living, needing some assistance to don her TED hose, minimal assist transfers, ambulating 125 feet, supervision wheelchair mobility.  Overall, strength and endurance greatly improved.  She was encouraged the overall progress.  Family teaching was completed  with her brother.  She would be discharge to home.  LATEST LABS:  An INR of 1.94.  Hemoglobin 12.5, hematocrit 36.4.  Sodium 132, potassium 4.9, BUN 19, creatinine 1.35.  DISCHARGE MEDICATIONS: 1. Amiodarone 200 mg daily. 2. Aspirin 81 mg daily. 3. BuSpar 15 mg daily as needed. 4. Os-Cal twice daily. 5. Zetia 10 mg daily. 6. Flonase 2 sprays daily as needed. 7. Folic acid, 1 tablet daily. 8. Lasix 20 mg daily. 9. Norco 2 tablets every 4 hours as needed pain. 10.Synthroid 175 mcg daily. 11.Claritin 10 mg daily. 12.Robaxin 500 mg every 6 hours as needed muscle spasms. 13.Lovaza 1000 mg daily. 14.Protonix 40 mg twice daily. 15.Klor-Con 20 mEq daily. 16.Evista 60 mg daily. 17.Senokot 2 tablets at bedtime. 18.Ultram had been scheduled 50 mg every 6 hours.  We would discuss     with the patient on plan as needed Norco versus scheduled Ultram. 19.Coumadin 3 mg daily, adjusted accordingly for an INR of 2.0 to 3.0. 20 cipro 250 bid for 7 days DIET:  Regular.  SPECIAL INSTRUCTIONS:  Home health nurse to draw prothrombin time on Tuesday, July 19, 2011, results to [facility].  Back brace when out of bed.  Follow up Dr. Sanda Linger.  Appointment made, Dr. Phoebe Perch, Neurosurgery.  Call for appointment, Dr. Claudette Laws, the outpatient rehab center as needed.     Mariam Dollar, P.A.   ______________________________ Erick Colace, M.D.    DA/MEDQ  D:  07/15/2011  T:  07/15/2011  Job:  [id number]  cc:   Sanda Linger, MD Clydene Fake, M.D.

## 2011-07-15 NOTE — Discharge Summary (Signed)
  Discharge summary job [id number]

## 2011-07-15 NOTE — Plan of Care (Signed)
Problem: SCI BOWEL ELIMINATION Goal: RH STG SCI MANAGE BOWEL PROGRAM W/ASSIST OR AS APPROPRIATE STG SCI Manage bowel program w/assist or as appropriate min assist  Outcome: Progressing Daily medication for Bowel management

## 2011-07-15 NOTE — Plan of Care (Signed)
Problem: SCI BOWEL ELIMINATION Goal: RH STG MANAGE BOWEL WITH ASSISTANCE STG Manage Bowel with Assistance min assist.  Outcome: Progressing Pt's LBM  07-13-11, held senna as incontinent on 07-13-11, no incontinence since 07-13-11

## 2011-07-15 NOTE — Plan of Care (Signed)
Problem: RH COGNITION-NURSING Goal: RH STG ANTICIPATES NEEDS/CALLS FOR ASSIST W/ASSIST/CUES STG Anticipates Needs/Calls for Assist With Assistance/Cues min assist  Outcome: Progressing Calls appropriately for assistance from nursing staff

## 2011-07-15 NOTE — Plan of Care (Signed)
Problem: SCI BOWEL ELIMINATION Goal: RH STG SCI MANAGE BOWEL WITH MEDICATION WITH ASSISTANCE STG SCI Manage bowel with medication with assistance min assist  Outcome: Progressing Pt taking Miralax, holding senna per pt request secondary to incontinence on 07-13-11

## 2011-07-16 DIAGNOSIS — Z5189 Encounter for other specified aftercare: Secondary | ICD-10-CM

## 2011-07-16 DIAGNOSIS — S22009A Unspecified fracture of unspecified thoracic vertebra, initial encounter for closed fracture: Secondary | ICD-10-CM

## 2011-07-16 LAB — PROTIME-INR
INR: 2.44 — ABNORMAL HIGH (ref 0.00–1.49)
Prothrombin Time: 26.9 seconds — ABNORMAL HIGH (ref 11.6–15.2)

## 2011-07-16 MED ORDER — WARFARIN SODIUM 2 MG PO TABS
2.0000 mg | ORAL_TABLET | Freq: Every day | ORAL | Status: DC
  Filled 2011-07-16: qty 1

## 2011-07-16 MED ORDER — WARFARIN SODIUM 2.5 MG PO TABS
2.5000 mg | ORAL_TABLET | Freq: Every day | ORAL | Status: DC
  Filled 2011-07-16: qty 1

## 2011-07-16 NOTE — Progress Notes (Signed)
Patient ID: [name], female   DOB: Feb 25, 1951, 61 y.o.   MRN: [id number] Patient ID: [name], female   DOB: 12/13/1950, 61 y.o.   MRN: [id number]  Subjective/Complaints:   Review of Systems  Gastrointestinal: Positive for nausea and vomiting. Negative for abdominal pain.  Neurological: Positive for dizziness.       When standing up  All other systems reviewed and are negative.   Objective: Vital Signs: Blood pressure 98/63, pulse 47, temperature 97.8 F (36.6 C), temperature source Oral, resp. rate 18, weight 72.6 kg (160 lb 0.9 oz), SpO2 99.00%. No results found. Results for orders placed during the hospital encounter of 07/08/11 (from the past 72 hour(s))  URINALYSIS, ROUTINE W REFLEX MICROSCOPIC     Status: Abnormal   Collection Time   07/13/11  1:53 PM      Component Value Range Comment   Color, Urine YELLOW  YELLOW     APPearance CLOUDY (*) CLEAR     Specific Gravity, Urine 1.012  1.005 - 1.030     pH 6.0  5.0 - 8.0     Glucose, UA NEGATIVE  NEGATIVE (mg/dL)    Hgb urine dipstick SMALL (*) NEGATIVE     Bilirubin Urine NEGATIVE  NEGATIVE     Ketones, ur NEGATIVE  NEGATIVE (mg/dL)    Protein, ur NEGATIVE  NEGATIVE (mg/dL)    Urobilinogen, UA 1.0  0.0 - 1.0 (mg/dL)    Nitrite NEGATIVE  NEGATIVE     Leukocytes, UA MODERATE (*) NEGATIVE    URINE MICROSCOPIC-ADD ON     Status: Abnormal   Collection Time   07/13/11  1:53 PM      Component Value Range Comment   Squamous Epithelial / LPF MANY (*) RARE     WBC, UA 3-6  <3 (WBC/hpf)    RBC / HPF 0-2  <3 (RBC/hpf)    Bacteria, UA FEW (*) RARE     Urine-Other AMORPHOUS URATES/PHOSPHATES     URINE CULTURE     Status: Normal (Preliminary result)   Collection Time   07/13/11  1:54 PM      Component Value Range Comment   Specimen Description URINE, CLEAN CATCH      Special Requests NONE      Culture  Setup Time [id number]      Colony Count >=100,000 COLONIES/ML      Culture ESCHERICHIA COLI      Report Status PENDING      PROTIME-INR     Status: Abnormal   Collection Time   07/14/11  5:32 AM      Component Value Range Comment   Prothrombin Time 22.5 (*) 11.6 - 15.2 (seconds)    INR 1.94 (*) 0.00 - 1.49    PROTIME-INR     Status: Abnormal   Collection Time   07/15/11  6:38 AM      Component Value Range Comment   Prothrombin Time 26.0 (*) 11.6 - 15.2 (seconds)    INR 2.34 (*) 0.00 - 1.49    PROTIME-INR     Status: Abnormal   Collection Time   07/16/11  5:30 AM      Component Value Range Comment   Prothrombin Time 26.9 (*) 11.6 - 15.2 (seconds)    INR 2.44 (*) 0.00 - 1.49      GEN; NAD Mood/affect anxious Lungs Clear Cor:  Bradycardia, no murmur Abd:  +BS, soft,no tenderness Ext: no C/C/E Motor 4/5 in BUE and BLE except 3/5 in L  ankle dorsiflexor Gait- rolling walker,  toe drag with distance walking, SBA  Assessment/Plan: 1. Functional deficits secondary to Deconditioning which require 3+ hours per day of interdisciplinary therapy in a comprehensive inpatient rehab setting.   Home today   FIM: FIM - Bathing Bathing Steps Patient Completed: Chest;Right Arm;Left Arm;Abdomen;Front perineal area;Buttocks;Right upper leg;Left upper leg;Right lower leg (including foot);Left lower leg (including foot) Bathing: 6: More than reasonable amount of time  FIM - Upper Body Dressing/Undressing Upper body dressing/undressing steps patient completed: Thread/unthread right sleeve of pullover shirt/dresss;Thread/unthread left sleeve of pullover shirt/dress;Pull shirt over trunk;Put head through opening of pull over shirt/dress Upper body dressing/undressing: 6: More than reasonable amount of time FIM - Lower Body Dressing/Undressing Lower body dressing/undressing steps patient completed: Thread/unthread right underwear leg;Thread/unthread left underwear leg;Pull underwear up/down;Thread/unthread left pants leg;Pull pants up/down;Thread/unthread right pants leg;Don/Doff right sock;Don/Doff left sock;Don/Doff  right shoe;Don/Doff left shoe;Fasten/unfasten right shoe;Fasten/unfasten left shoe Lower body dressing/undressing: 6: More than reasonable amount of time  FIM - Toileting Toileting steps completed by patient: Adjust clothing prior to toileting;Performs perineal hygiene;Adjust clothing after toileting Toileting Assistive Devices: Grab bar or rail for support Toileting: 6: More than reasonable amount of time  FIM - Diplomatic Services operational officer Devices: Environmental consultant;Bedside commode Toilet Transfers: 6-To toilet/ BSC;6-From toilet/BSC  FIM - Bed/Chair Transfer Bed/Chair Transfer Assistive Devices: Therapist, occupational: 7: Supine > Sit: No assist;7: Sit > Supine: No assist;6: Bed > Chair or W/C: No assist;6: Chair or W/C > Bed: No assist  FIM - Locomotion: Wheelchair Locomotion: Wheelchair: 5: Travels 150 ft or more: maneuvers on rugs and over door sills with supervision, cueing or coaxing FIM - Locomotion: Ambulation Locomotion: Ambulation Assistive Devices: Walker - Rolling;Orthosis Ambulation/Gait Assistance: 6: Modified independent (Device/Increase time) Locomotion: Ambulation: 6: Travels 150 ft or more with assistive device/no helper  Comprehension Comprehension Mode: Auditory Comprehension: 7-Follows complex conversation/direction: With no assist  Expression Expression Mode: Verbal Expression: 7-Expresses complex ideas: With no assist  Social Interaction Social Interaction: 6-Interacts appropriately with others with medication or extra time (anti-anxiety, antidepressant).  Problem Solving Problem Solving: 6-Solves complex problems: With extra time  Memory Memory: 6-More than reasonable amt of time  2. Anticoagulation/DVT prophylaxis with Pharmaceutical: Coumadin Medical Problem List and Plan:  1. Lumbar L2 compression fracture with osteoporosis. Conservative care with a back brace  2. DVT Prophylaxis/Anticoagulation: Chronic Coumadin therapy for history of  left ventricular mural thrombus. Monitor for any signs of bleeding  3. Pain Management: D/CDuragesic patch Robaxin as needed. Vicodin as needed ,tramadol for mild pain 4. Hypertension. Amiodarone 200 mg daily, Coreg 6.25 mg twice a day, Lasix 20 mg daily, lisinopril 2.5 mg twice daily. Need to watch bp's which have been running low. Follow symptoms more than anything else. 5. Hypothyroidism. Synthroid  Hyperlipidemia. zetia  6. Chronic renal insufficiency. Baseline creatinine 1.2-1.5. Strict I&O  7. Anxiety/depression. BuSpar as needed  8 diverticulitis. Protonix  9. Chronic left lumbosacral radiculopathy 10.  Bradycardia due to beta blocker will reduce dose and monitor HR,  changedto 3.125mg  coreg-cards f/u as outpt 11.  Nasuea- likely med related improved LOS (Days) 8 A FACE TO FACE EVALUATION WAS PERFORMED  Oumou Smead T 07/16/2011, 9:10 AM

## 2011-07-16 NOTE — Progress Notes (Signed)
Patient alert and oriented x3. Patient pain controlled this morning. Patient given pain prescriptions from Dr Riley Kill. Patient D/C at 1025 with all d/c instructions from Harvel Ricks PA and belongings with family. Patient vitals stable. Patient verbalized understanding of D/C instructions. Patient still brady and blood pressure a little low. Dr Riley Kill notified. Ok to continue with scheduled medication. Patient back brace and AFO intact upon D/C. Patient to continue on 3mg  Coumadin at home.

## 2011-07-16 NOTE — Progress Notes (Addendum)
ANTICOAGULATION CONSULT NOTE - Follow Up Consult  Pharmacy Consult:  Coumadin Indication: atrial fibrillation and h/o mural thrombus  Allergies  Allergen Reactions  . Penicillins Swelling    Throat swells  . Prevacid Nausea Only  . Statins Other (See Comments)    cramps    Patient Measurements: Weight: 160 lb 0.9 oz (72.6 kg)  Vital Signs: Temp: 97.8 F (36.6 C) (04/27 0500) Temp src: Oral (04/27 0500) BP: 98/63 mmHg (04/27 0500) Pulse Rate: 47  (04/27 0500)  Labs:  Basename 07/16/11 0530 07/15/11 1610 07/14/11 0532  HGB -- -- --  HCT -- -- --  PLT -- -- --  APTT -- -- --  LABPROT 26.9* 26.0* 22.5*  INR 2.44* 2.34* 1.94*  HEPARINUNFRC -- -- --  CREATININE -- -- --  CKTOTAL -- -- --  CKMB -- -- --  TROPONINI -- -- --   The CrCl is unknown because both a height and weight (above a minimum accepted value) are required for this calculation.    Assessment: [name] on Coumadin for chronic Afib.  INR therapeutic, no bleeding noted.  Was on Coumadin 3mg  PO daily PTA. Currently on cipro thru 5/3.   Goal of Therapy:  INR 2-3    Plan:  - Coumadin 2 mg PO daily - Daily INR  Talbert Cage, PharmD Pager:  [phone] 07/16/2011, 8:48 AM

## 2011-07-17 LAB — URINE CULTURE: Colony Count: >=100000

## 2011-07-20 ENCOUNTER — Telehealth: Payer: Self-pay | Admitting: Physical Medicine & Rehabilitation

## 2011-07-20 NOTE — Telephone Encounter (Signed)
Verbal given to Onalee Hua to continue plan of care.

## 2011-07-20 NOTE — Telephone Encounter (Signed)
Verbal on POC, frequency 3/wk2, 2wk/4.  Please let know if there is any change in lifting restrictions.

## 2011-07-22 ENCOUNTER — Ambulatory Visit: Payer: Self-pay | Admitting: Cardiology

## 2011-07-22 DIAGNOSIS — I255 Ischemic cardiomyopathy: Secondary | ICD-10-CM

## 2011-07-27 ENCOUNTER — Ambulatory Visit: Payer: Medicare Other | Admitting: Internal Medicine

## 2011-07-29 ENCOUNTER — Ambulatory Visit: Payer: Self-pay | Admitting: Cardiology

## 2011-07-29 DIAGNOSIS — I255 Ischemic cardiomyopathy: Secondary | ICD-10-CM

## 2011-08-05 ENCOUNTER — Ambulatory Visit: Payer: Self-pay | Admitting: Cardiovascular Disease

## 2011-08-05 DIAGNOSIS — I255 Ischemic cardiomyopathy: Secondary | ICD-10-CM

## 2011-08-09 ENCOUNTER — Telehealth: Payer: Self-pay

## 2011-08-09 NOTE — Telephone Encounter (Signed)
Erin with [facility] is requesting to extend home health and to start occupational therapy to evaluate pt at home.

## 2011-08-10 IMAGING — CR DG CHEST 1V PORT
2 series · 2 of 2 positions shown · non-contrast
Comparison: Chest 02/04/2010.

CLINICAL DATA: Central line placement.

PORTABLE CHEST - 1 VIEW

[AP (1 of 2)]
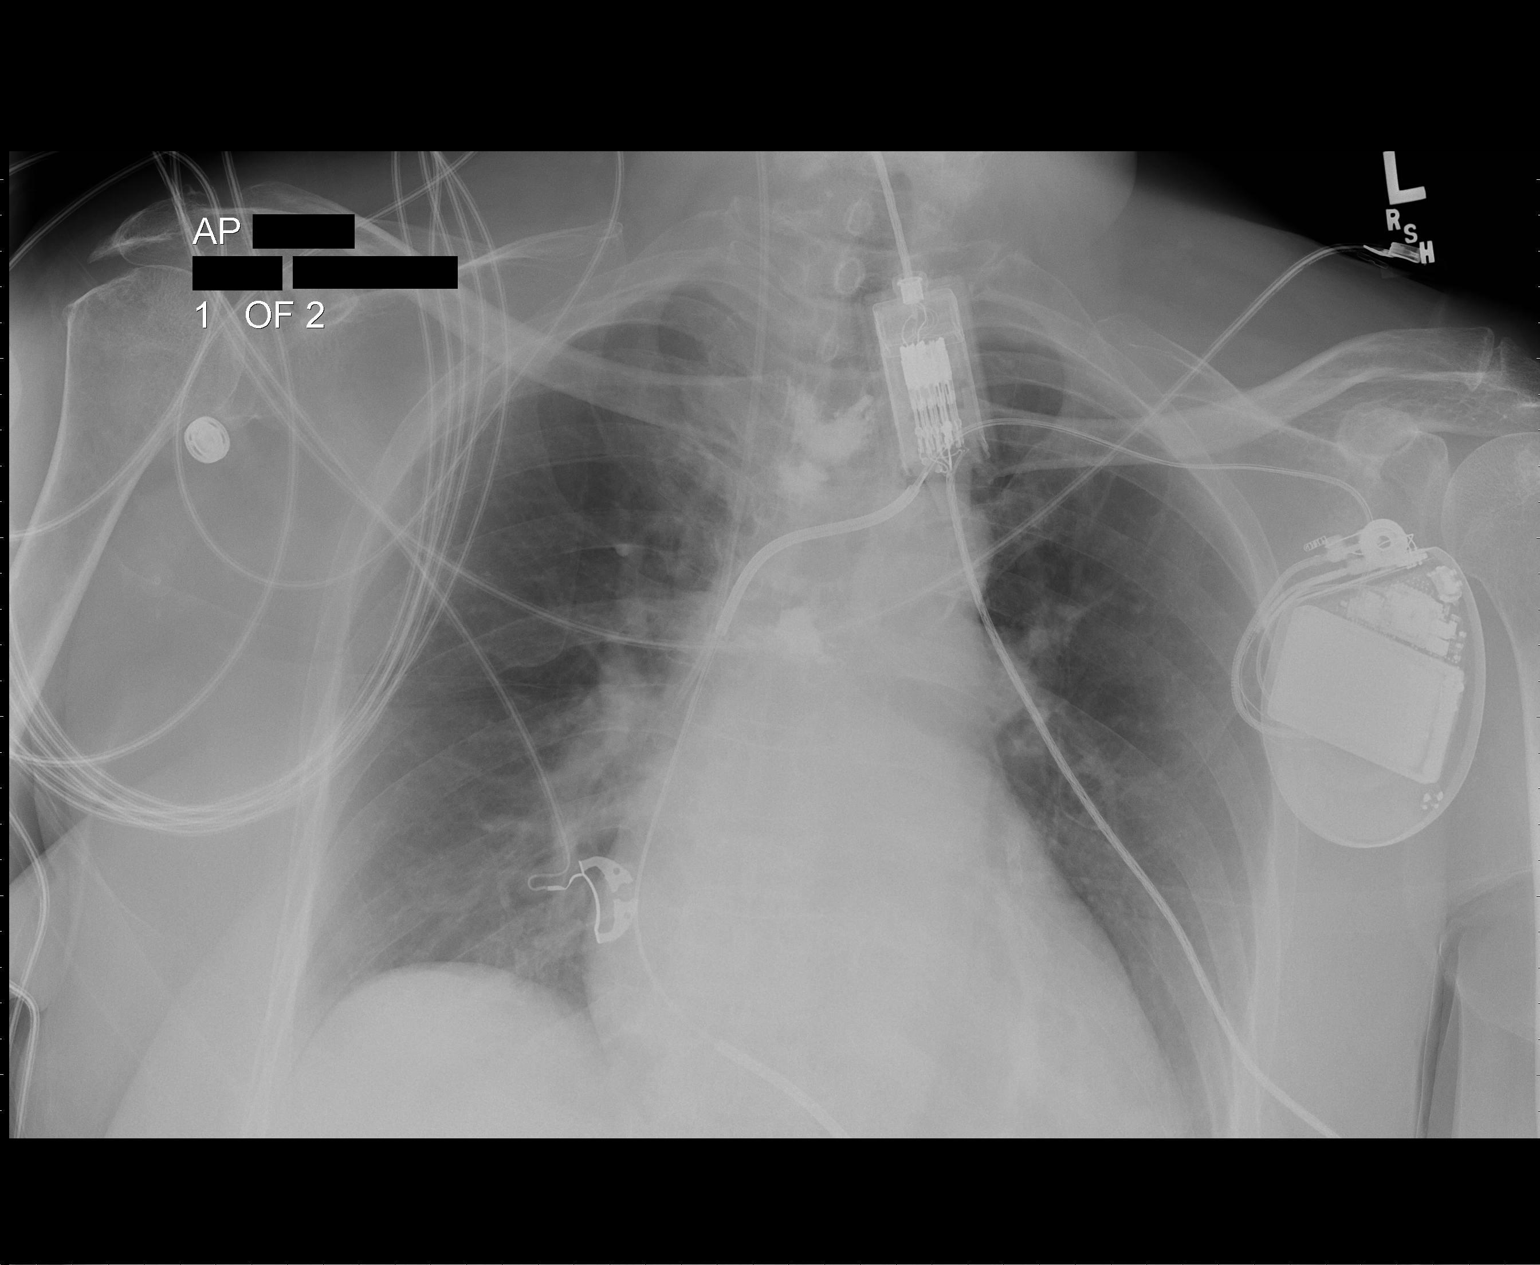

[AP (2 of 2)]
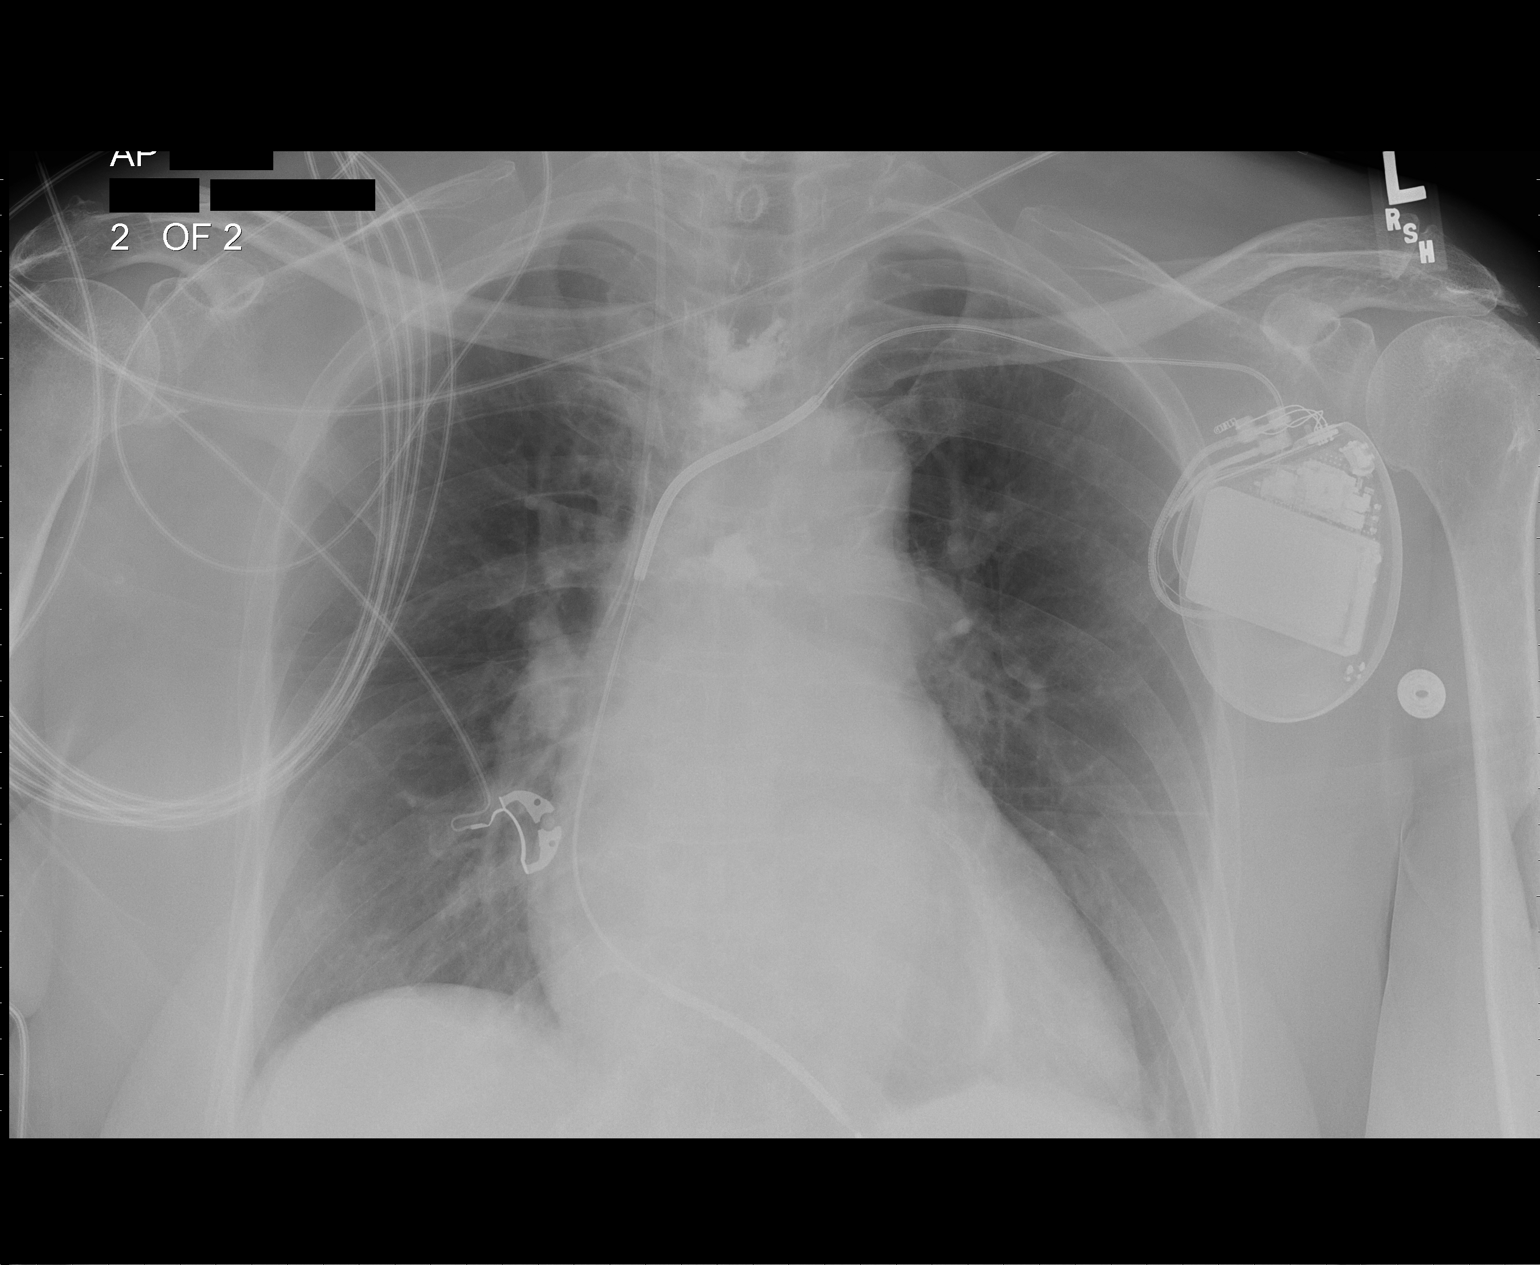

[2 of 2 positions shown; findings below may reference images not displayed]

FINDINGS: The patient has a new right IJ catheter with the tip in
the lower superior vena cava.  No pneumothorax.  Lung volumes are
low but the lungs are clear.  Mild cardiomegaly.  Postoperative
change of thoracic vertebroplasty noted.
IMPRESSION: Right IJ catheter in good position.  No pneumothorax or acute
finding.

## 2011-08-10 NOTE — Telephone Encounter (Signed)
Verbal order given to Erin.  

## 2011-08-12 ENCOUNTER — Ambulatory Visit: Payer: Self-pay

## 2011-08-12 DIAGNOSIS — I255 Ischemic cardiomyopathy: Secondary | ICD-10-CM

## 2011-08-12 IMAGING — CR DG CHEST 1V PORT
1 series · 1 of 1 positions shown · non-contrast
Comparison: 02/10/2010.

CLINICAL DATA: Shortness of breath.  Status post lumbar spine
fusion.

PORTABLE CHEST - 1 VIEW

[view not recorded]
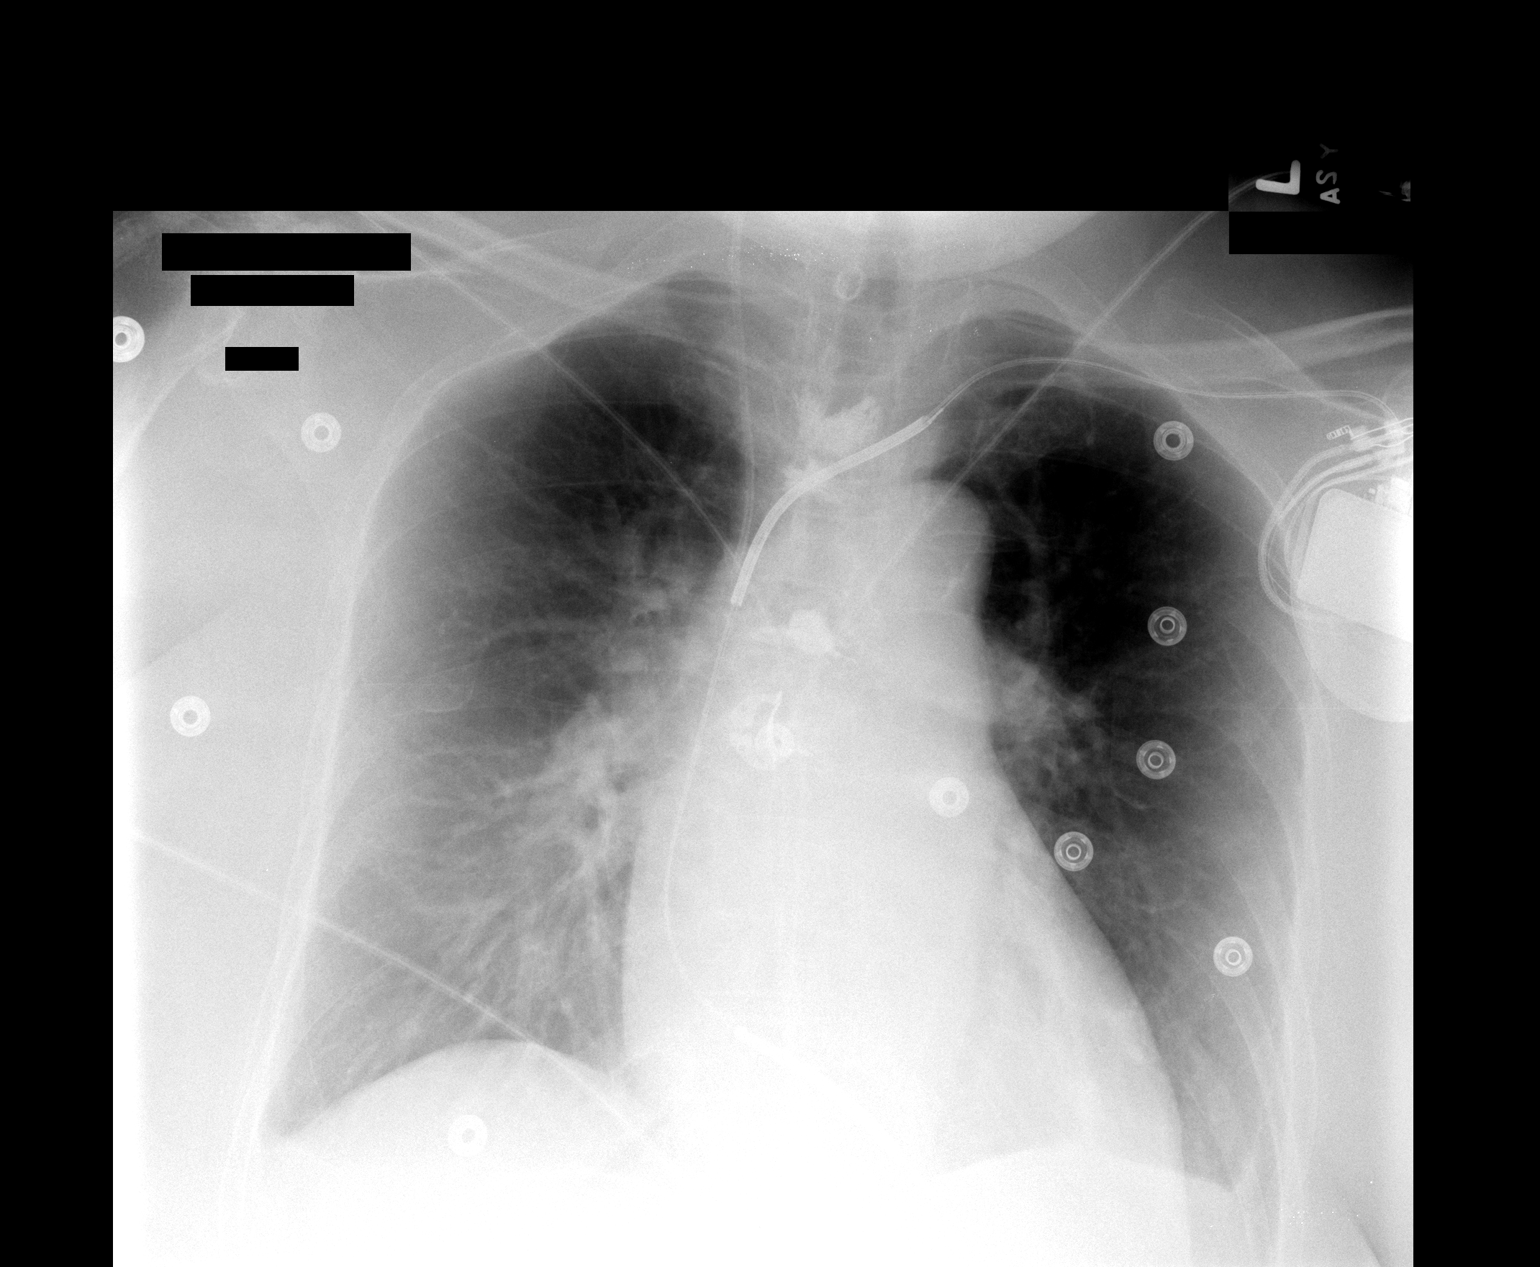

[1 of 1 positions shown; findings below may reference images not displayed]

FINDINGS: Stable COPD and mild left lower lobe atelectasis.  No
edema, infiltrate or pleural effusion identified.  Heart size is
stable.
IMPRESSION: Stable COPD and mild left lower lobe atelectasis.

## 2011-08-19 ENCOUNTER — Ambulatory Visit: Payer: Self-pay | Admitting: Internal Medicine

## 2011-08-19 DIAGNOSIS — I255 Ischemic cardiomyopathy: Secondary | ICD-10-CM

## 2011-08-24 ENCOUNTER — Telehealth: Payer: Self-pay | Admitting: Physical Medicine & Rehabilitation

## 2011-08-24 NOTE — Telephone Encounter (Signed)
Erin [facility] [phone].  Would like orders to extend PT orders 2wk/3.

## 2011-08-25 NOTE — Telephone Encounter (Signed)
Verbal order given to Erin.  

## 2011-08-29 ENCOUNTER — Ambulatory Visit (INDEPENDENT_AMBULATORY_CARE_PROVIDER_SITE_OTHER): Payer: Medicare Other | Admitting: Cardiology

## 2011-08-29 DIAGNOSIS — I255 Ischemic cardiomyopathy: Secondary | ICD-10-CM

## 2011-08-29 DIAGNOSIS — I2589 Other forms of chronic ischemic heart disease: Secondary | ICD-10-CM

## 2011-09-02 ENCOUNTER — Other Ambulatory Visit: Payer: Self-pay | Admitting: Internal Medicine

## 2011-09-09 ENCOUNTER — Ambulatory Visit: Payer: Self-pay | Admitting: Cardiovascular Disease

## 2011-09-09 DIAGNOSIS — I255 Ischemic cardiomyopathy: Secondary | ICD-10-CM

## 2011-09-09 LAB — POCT INR: INR: 3.2

## 2011-09-14 ENCOUNTER — Encounter (HOSPITAL_COMMUNITY): Payer: Self-pay | Admitting: *Deleted

## 2011-09-14 ENCOUNTER — Emergency Department (HOSPITAL_COMMUNITY): Payer: Medicare Other

## 2011-09-14 ENCOUNTER — Inpatient Hospital Stay (HOSPITAL_COMMUNITY)
Admission: EM | Admit: 2011-09-14 | Discharge: 2011-09-16 | DRG: 291 | Disposition: A | Discharge status: Home / Self Care | Payer: Medicare Other | Attending: Internal Medicine | Admitting: Internal Medicine

## 2011-09-14 DIAGNOSIS — K219 Gastro-esophageal reflux disease without esophagitis: Secondary | ICD-10-CM | POA: Diagnosis present

## 2011-09-14 DIAGNOSIS — Z8673 Personal history of transient ischemic attack (TIA), and cerebral infarction without residual deficits: Secondary | ICD-10-CM

## 2011-09-14 DIAGNOSIS — I252 Old myocardial infarction: Secondary | ICD-10-CM

## 2011-09-14 DIAGNOSIS — I255 Ischemic cardiomyopathy: Secondary | ICD-10-CM | POA: Diagnosis present

## 2011-09-14 DIAGNOSIS — I5189 Other ill-defined heart diseases: Secondary | ICD-10-CM | POA: Diagnosis present

## 2011-09-14 DIAGNOSIS — I10 Essential (primary) hypertension: Secondary | ICD-10-CM | POA: Diagnosis present

## 2011-09-14 DIAGNOSIS — E785 Hyperlipidemia, unspecified: Secondary | ICD-10-CM | POA: Diagnosis present

## 2011-09-14 DIAGNOSIS — Z87891 Personal history of nicotine dependence: Secondary | ICD-10-CM

## 2011-09-14 DIAGNOSIS — I2510 Atherosclerotic heart disease of native coronary artery without angina pectoris: Secondary | ICD-10-CM | POA: Diagnosis present

## 2011-09-14 DIAGNOSIS — I4891 Unspecified atrial fibrillation: Secondary | ICD-10-CM | POA: Diagnosis present

## 2011-09-14 DIAGNOSIS — I2589 Other forms of chronic ischemic heart disease: Secondary | ICD-10-CM | POA: Diagnosis present

## 2011-09-14 DIAGNOSIS — I5023 Acute on chronic systolic (congestive) heart failure: Secondary | ICD-10-CM

## 2011-09-14 DIAGNOSIS — I513 Intracardiac thrombosis, not elsewhere classified: Secondary | ICD-10-CM | POA: Diagnosis present

## 2011-09-14 DIAGNOSIS — E039 Hypothyroidism, unspecified: Secondary | ICD-10-CM | POA: Diagnosis present

## 2011-09-14 DIAGNOSIS — N183 Chronic kidney disease, stage 3 unspecified: Secondary | ICD-10-CM | POA: Diagnosis present

## 2011-09-14 DIAGNOSIS — Z7901 Long term (current) use of anticoagulants: Secondary | ICD-10-CM

## 2011-09-14 DIAGNOSIS — I509 Heart failure, unspecified: Secondary | ICD-10-CM | POA: Diagnosis present

## 2011-09-14 DIAGNOSIS — Z95810 Presence of automatic (implantable) cardiac defibrillator: Secondary | ICD-10-CM

## 2011-09-14 DIAGNOSIS — Z96659 Presence of unspecified artificial knee joint: Secondary | ICD-10-CM

## 2011-09-14 DIAGNOSIS — I359 Nonrheumatic aortic valve disorder, unspecified: Secondary | ICD-10-CM | POA: Diagnosis present

## 2011-09-14 DIAGNOSIS — I129 Hypertensive chronic kidney disease with stage 1 through stage 4 chronic kidney disease, or unspecified chronic kidney disease: Secondary | ICD-10-CM | POA: Diagnosis present

## 2011-09-14 DIAGNOSIS — J9600 Acute respiratory failure, unspecified whether with hypoxia or hypercapnia: Secondary | ICD-10-CM | POA: Diagnosis present

## 2011-09-14 DIAGNOSIS — Z9861 Coronary angioplasty status: Secondary | ICD-10-CM

## 2011-09-14 DIAGNOSIS — K449 Diaphragmatic hernia without obstruction or gangrene: Secondary | ICD-10-CM | POA: Diagnosis present

## 2011-09-14 DIAGNOSIS — Z79899 Other long term (current) drug therapy: Secondary | ICD-10-CM

## 2011-09-14 DIAGNOSIS — N189 Chronic kidney disease, unspecified: Secondary | ICD-10-CM | POA: Diagnosis present

## 2011-09-14 DIAGNOSIS — M1990 Unspecified osteoarthritis, unspecified site: Secondary | ICD-10-CM | POA: Diagnosis present

## 2011-09-14 DIAGNOSIS — D649 Anemia, unspecified: Secondary | ICD-10-CM | POA: Diagnosis present

## 2011-09-14 DIAGNOSIS — Z7982 Long term (current) use of aspirin: Secondary | ICD-10-CM

## 2011-09-14 DIAGNOSIS — R079 Chest pain, unspecified: Secondary | ICD-10-CM

## 2011-09-14 LAB — BASIC METABOLIC PANEL
Chloride: 105 mEq/L (ref 96–112)
Creatinine, Ser: 1.13 mg/dL — ABNORMAL HIGH (ref 0.50–1.10)
GFR calc Af Amer: 60 mL/min — ABNORMAL LOW (ref >90)

## 2011-09-14 LAB — CBC
MCV: 96.4 fL (ref 78.0–100.0)
Platelets: 174 10*3/uL (ref 150–400)
RDW: 18.5 % — ABNORMAL HIGH (ref 11.5–15.5)
WBC: 3.1 10*3/uL — ABNORMAL LOW (ref 4.0–10.5)

## 2011-09-14 LAB — URINALYSIS, ROUTINE W REFLEX MICROSCOPIC
Bilirubin Urine: NEGATIVE
Glucose, UA: NEGATIVE mg/dL
Hgb urine dipstick: NEGATIVE
Ketones, ur: NEGATIVE mg/dL
Protein, ur: NEGATIVE mg/dL

## 2011-09-14 LAB — CARDIAC PANEL(CRET KIN+CKTOT+MB+TROPI)
CK, MB: 2.2 ng/mL (ref 0.3–4.0)
Total CK: 101 U/L (ref 7–177)

## 2011-09-14 LAB — POCT I-STAT 3, ART BLOOD GAS (G3+)
O2 Saturation: 100 %
Patient temperature: 97.6
TCO2: 23 mmol/L (ref 0–100)
pH, Arterial: 7.362 (ref 7.350–7.400)

## 2011-09-14 LAB — PRO B NATRIURETIC PEPTIDE: Pro B Natriuretic peptide (BNP): 2120 pg/mL — ABNORMAL HIGH (ref 0–125)

## 2011-09-14 MED ORDER — ASPIRIN EC 81 MG PO TBEC
81.0000 mg | DELAYED_RELEASE_TABLET | Freq: Every day | ORAL | Status: DC
  Administered 2011-09-14 – 2011-09-16 (×3): 81 mg via ORAL
  Filled 2011-09-14 (×3): qty 1

## 2011-09-14 MED ORDER — NITROGLYCERIN 0.4 MG SL SUBL: 0.4000 mg | SUBLINGUAL_TABLET | SUBLINGUAL | Status: DC | PRN

## 2011-09-14 MED ORDER — OMEGA-3 FATTY ACIDS 1000 MG PO CAPS: 1000.0000 mg | ORAL_CAPSULE | Freq: Every day | ORAL | Status: DC

## 2011-09-14 MED ORDER — AMIODARONE HCL 200 MG PO TABS
200.0000 mg | ORAL_TABLET | Freq: Every day | ORAL | Status: DC
  Administered 2011-09-15 – 2011-09-16 (×2): 200 mg via ORAL
  Filled 2011-09-14 (×2): qty 1

## 2011-09-14 MED ORDER — NITROGLYCERIN 0.4 MG SL SUBL
0.4000 mg | SUBLINGUAL_TABLET | SUBLINGUAL | Status: DC | PRN
  Administered 2011-09-14: 0.4 mg via SUBLINGUAL
  Filled 2011-09-14: qty 75

## 2011-09-14 MED ORDER — LORATADINE 10 MG PO TABS
10.0000 mg | ORAL_TABLET | Freq: Every day | ORAL | Status: DC
  Administered 2011-09-15 – 2011-09-16 (×2): 10 mg via ORAL
  Filled 2011-09-14 (×2): qty 1

## 2011-09-14 MED ORDER — OMEGA-3-ACID ETHYL ESTERS 1 G PO CAPS
1.0000 g | ORAL_CAPSULE | Freq: Every day | ORAL | Status: DC
  Administered 2011-09-15 – 2011-09-16 (×2): 1 g via ORAL
  Filled 2011-09-14 (×2): qty 1

## 2011-09-14 MED ORDER — SENNA 8.6 MG PO TABS
2.0000 | ORAL_TABLET | Freq: Every day | ORAL | Status: DC
  Administered 2011-09-15: 17.2 mg via ORAL
  Filled 2011-09-14 (×3): qty 2

## 2011-09-14 MED ORDER — SODIUM CHLORIDE 0.9 % IV SOLN: 250.0000 mL | INTRAVENOUS | Status: DC | PRN

## 2011-09-14 MED ORDER — CARVEDILOL 12.5 MG PO TABS
12.5000 mg | ORAL_TABLET | Freq: Two times a day (BID) | ORAL | Status: DC
  Administered 2011-09-14 – 2011-09-16 (×2): 12.5 mg via ORAL
  Filled 2011-09-14 (×6): qty 1

## 2011-09-14 MED ORDER — SODIUM CHLORIDE 0.9 % IJ SOLN: 3.0000 mL | INTRAMUSCULAR | Status: DC | PRN

## 2011-09-14 MED ORDER — HYDROCODONE-ACETAMINOPHEN 5-325 MG PO TABS: 1.0000 | ORAL_TABLET | ORAL | Status: DC | PRN

## 2011-09-14 MED ORDER — FUROSEMIDE 40 MG PO TABS
40.0000 mg | ORAL_TABLET | Freq: Two times a day (BID) | ORAL | Status: DC
  Filled 2011-09-14: qty 1

## 2011-09-14 MED ORDER — CALCIUM CARBONATE-VITAMIN D 500-200 MG-UNIT PO TABS
1.0000 | ORAL_TABLET | Freq: Two times a day (BID) | ORAL | Status: DC
  Administered 2011-09-15 – 2011-09-16 (×3): 1 via ORAL
  Filled 2011-09-14 (×5): qty 1

## 2011-09-14 MED ORDER — WARFARIN - PHARMACIST DOSING INPATIENT: Freq: Every day | Status: DC

## 2011-09-14 MED ORDER — SODIUM CHLORIDE 0.9 % IJ SOLN
3.0000 mL | Freq: Two times a day (BID) | INTRAMUSCULAR | Status: DC
  Administered 2011-09-14 – 2011-09-15 (×2): 3 mL via INTRAVENOUS

## 2011-09-14 MED ORDER — PANTOPRAZOLE SODIUM 40 MG PO TBEC
40.0000 mg | DELAYED_RELEASE_TABLET | Freq: Two times a day (BID) | ORAL | Status: DC
  Administered 2011-09-14 – 2011-09-16 (×4): 40 mg via ORAL
  Filled 2011-09-14 (×4): qty 1

## 2011-09-14 MED ORDER — FLUTICASONE PROPIONATE 50 MCG/ACT NA SUSP
2.0000 | Freq: Every day | NASAL | Status: DC | PRN
  Filled 2011-09-14: qty 16

## 2011-09-14 MED ORDER — LISINOPRIL 2.5 MG PO TABS
2.5000 mg | ORAL_TABLET | Freq: Every day | ORAL | Status: DC
  Administered 2011-09-15 – 2011-09-16 (×2): 2.5 mg via ORAL
  Filled 2011-09-14 (×2): qty 1

## 2011-09-14 MED ORDER — LEVOTHYROXINE SODIUM 175 MCG PO TABS
175.0000 ug | ORAL_TABLET | Freq: Every day | ORAL | Status: DC
  Administered 2011-09-15 – 2011-09-16 (×2): 175 ug via ORAL
  Filled 2011-09-14 (×3): qty 1

## 2011-09-14 MED ORDER — FUROSEMIDE 10 MG/ML IJ SOLN
60.0000 mg | Freq: Once | INTRAMUSCULAR | Status: AC
  Administered 2011-09-14: 60 mg via INTRAVENOUS
  Filled 2011-09-14: qty 6

## 2011-09-14 MED ORDER — POTASSIUM CHLORIDE CRYS ER 20 MEQ PO TBCR
30.0000 meq | EXTENDED_RELEASE_TABLET | Freq: Once | ORAL | Status: AC
  Administered 2011-09-15: 30 meq via ORAL
  Filled 2011-09-14: qty 1

## 2011-09-14 MED ORDER — METHOCARBAMOL 500 MG PO TABS
500.0000 mg | ORAL_TABLET | Freq: Four times a day (QID) | ORAL | Status: DC | PRN
  Administered 2011-09-14 – 2011-09-15 (×2): 500 mg via ORAL
  Filled 2011-09-14 (×2): qty 1

## 2011-09-14 MED ORDER — ALPRAZOLAM 0.25 MG PO TABS: 0.2500 mg | ORAL_TABLET | Freq: Two times a day (BID) | ORAL | Status: DC | PRN

## 2011-09-14 MED ORDER — FA-PYRIDOXINE-CYANOCOBALAMIN 2.5-25-2 MG PO TABS
1.0000 | ORAL_TABLET | Freq: Every day | ORAL | Status: DC
  Administered 2011-09-15 – 2011-09-16 (×2): 1 via ORAL
  Filled 2011-09-14 (×2): qty 1

## 2011-09-14 MED ORDER — EZETIMIBE 10 MG PO TABS
10.0000 mg | ORAL_TABLET | Freq: Every day | ORAL | Status: DC
  Administered 2011-09-15 – 2011-09-16 (×2): 10 mg via ORAL
  Filled 2011-09-14 (×2): qty 1

## 2011-09-14 MED ORDER — POTASSIUM CHLORIDE CRYS ER 20 MEQ PO TBCR
20.0000 meq | EXTENDED_RELEASE_TABLET | Freq: Two times a day (BID) | ORAL | Status: DC
  Administered 2011-09-16: 20 meq via ORAL
  Filled 2011-09-14 (×2): qty 1

## 2011-09-14 MED ORDER — ACETAMINOPHEN 325 MG PO TABS: 650.0000 mg | ORAL_TABLET | ORAL | Status: DC | PRN

## 2011-09-14 MED ORDER — ZOLPIDEM TARTRATE 5 MG PO TABS: 10.0000 mg | ORAL_TABLET | Freq: Every evening | ORAL | Status: DC | PRN

## 2011-09-14 MED ORDER — FUROSEMIDE 10 MG/ML IJ SOLN
60.0000 mg | Freq: Once | INTRAMUSCULAR | Status: DC
  Filled 2011-09-14: qty 6

## 2011-09-14 MED ORDER — ONDANSETRON HCL 4 MG/2ML IJ SOLN: 4.0000 mg | Freq: Four times a day (QID) | INTRAMUSCULAR | Status: DC | PRN

## 2011-09-14 MED ORDER — CYCLOBENZAPRINE HCL 10 MG PO TABS: 5.0000 mg | ORAL_TABLET | Freq: Two times a day (BID) | ORAL | Status: DC | PRN

## 2011-09-14 MED ORDER — ASPIRIN 81 MG PO TBEC: 81.0000 mg | DELAYED_RELEASE_TABLET | Freq: Every day | ORAL | Status: DC

## 2011-09-14 MED ORDER — ONDANSETRON 4 MG PO TBDP
8.0000 mg | ORAL_TABLET | ORAL | Status: AC
  Administered 2011-09-14: 8 mg via ORAL
  Filled 2011-09-14: qty 2

## 2011-09-14 NOTE — ED Notes (Signed)
In to assess pt.  Pt complaining of 3/10 CP.  Performed EKG and took to MD Linker.  Pt also complaining of nausea.  Pt alert and oriented.  Assisted pt with movement in stretcher.

## 2011-09-14 NOTE — H&P (Signed)
History and Physical   Patient ID: [name] MRN: [id number], DOB/AGE: 04/25/1950   Admit date: 09/14/2011 Date of Consult: 09/14/2011   Primary Physician: Sanda Linger, MD Primary Cardiologist: Rollene Rotunda, MD  Pt. Profile: [name] is a 61yo female with a complex PMHx significant for CAD (s/p multiple PCIs; most recent cardiac cath in 11/12 revealed nonobstructive CAD), ischemic cardiomyopathy (EF 25-30% per echo 2012 s/p Guidant ICD implant 2006- normal functionality per 08/12 interrogation), LV thrombus (on chronic Coumadin anticoagulation), history SVT, PAF (on amiodarone), moderate AI (2011 echo), HTN, HL, history of TIA, hypothyroidism and GERD who presents to [facility] with c/o chest pain today.   HPI:   She has recently seen Dr. Antoine Poche in follow-up in 03/13. The patient underwent a cardiac cath in 11/12 for chest pain, this revealed small vessel disease, but no significant large vessel CAD. LAD stents were patent. She has been managed by GI for her chest pain symptoms, described as "chest gurgling" which had continued intermittently since her prior cath, and are similar to the chest pain prompting catheterization then. These had been relieved with ASA, protonix and NTG. She had been undergoing PT for her back without incident. No SOB, PND or orthopnea. He noted that her recent symptoms were unlikely to be cardiac, and no further ischemic work-up was recommended. She was encouraged to follow her GI MD, pursue further risk reduction and was otherwise stable.   She reports awaking this morning and experiencing shortness of breath. She then became diaphoretic and lightheadedness. She had been working with PT regarding a recent vertebral compression fracture, and this had been going well. She denied chest pain, palpitations, orthopnea, PND, increased weight gain or LE edema (her CHF exacerbations typically only present with dyspnea). She denies increased sodium intake,  fluid intake, increased activity level or medication noncompliance. Her BP has been at baseline (labile). No ICD shocks. There was no noticeable decrease in activity level. TSH is followed by PCP, has been stable, but Synthroid gradually increased over time. She denies n/v/d, urinary changes, fevers, chills, new cough or sick contacts. No alcohol, new medications, OTC supplements, illcit drug use. She has not smoked cigarettes for 20 years. EMS was called and transported her to [facility] ED.   Upon ED arrival, pBNP is elevated at 2120.0. CXR reveals mild bibasilar opacities consistent with pulmonary edema. Cardiac biomarkers negative x 1. Cr 1.13 consistent with baseline. Neutropenic at 3.1. INR supratherapeutic at 3.46. She received Lasix 60mg  IV x 1 with good output (~1L) and improvement in dyspnea.   Problem List: Past Medical History  Diagnosis Date  . Spinal stenosis   . Numbness of foot     left foot  . Diverticulitis     Status post partial colectomy 1/12 with reversal in July 2012  . Colitis, ischemic   . DJD (degenerative joint disease)     History of multiple surgeries to the back, shoulder and knee  . Ischemic cardiomyopathy     Echo 06/16/10: EF 25-30%, anteroseptal and apical hypokinesis, moderate AI, mild MR  . Chronic systolic heart failure   . CAD (coronary artery disease)     a.  Ant MI 8/03 with stenting of the LAD;  b. staged PCI with Cypher DES to OM1 8/03;  c. s/p Cypher DES to LAD 7/04;  d.  multiple cardiac caths in past (chronically abnl ECG);    e. cardiac catheterization 3/12: LAD stent patent, distal LAD 40-50%, small ostial D1 80%,  ostial D2 60%, OM-1 stent patent (20-30%), proximal RCA 50%, proximal to mid RCA 40-50%; f. cath 02/17/2011 - nonobs  . LV (left ventricular) mural thrombus     Chronic Coumadin therapy  . CKD (chronic kidney disease), stage III     creatinine:  1.3 in 4/12;    . Tobacco abuse     history  . GERD (gastroesophageal reflux disease)   .  Hypertension   . Lower GI bleed     August 2011  . Hypothyroidism   . Aortic insufficiency   . Pseudoaneurysm     History of, right forearm  . Hyperlipidemia   . OA (osteoarthritis)   . Abnormal EKG     Chronically abnormal EKG.   . Spinal stenosis   . Numbness of foot     Left foot  . Compression fracture 07/04/11    L2  . Anterior myocardial infarction 10/2001  . CHF (congestive heart failure)   . SVT (supraventricular tachycardia)     ? h/o AFib;  patient on long term amiodarone therapy  . PAF (paroxysmal atrial fibrillation)   . Anatomical narrow angle of right eye   . Heart murmur     "age [age]"  . Shortness of breath     "at times; related to CHF"  . Shortness of breath on exertion   . 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 Graciela Husbands.   [name] AICD (automatic cardioverter/defibrillator) present 10/2004    explant; implant  . Blood transfusion   . Anemia   . H/O hiatal hernia   . Stroke     History of TIA and possible CVA; hospitalized 2004; on coumadin    Past Surgical History  Procedure Date  . Back surgery   . Coronary angioplasty with stent placement 10/2001; 09/2002  . Cardiac defibrillator placement 07/2003; 10/2004  . Lumbar laminectomy/decompression microdiscectomy 10/2001    L5-S1/E-chart  . Knee arthroscopy     left  . Thyroidectomy 1970's  . Total knee arthroplasty 11/2003    left  . X-stop implantation 12/2004    L3-4; L4-5  . Fixation kyphoplasty thoracic spine 07/2007    T3, 4, 6 compression fractures  . Shoulder open rotator cuff repair 04/2008    right  . Lumbar laminectomy/decompression microdiscectomy 01/2010  . Colectomy 03/2010    sigmoid left  . Colostomy 03/2010    transverse  . Peripherally inserted central catheter insertion 03/2010    removed upon discharge  . Colostomy closure 12/2010    reversal  . Tonsillectomy and adenoidectomy 1969  . Appendectomy 03/2010  . Cataract extraction     right      Allergies:  Allergies  Allergen Reactions  . Lansoprazole Nausea Only  . Penicillins Swelling    Throat swells  . Statins Other (See Comments)    cramps    Home Medications: Prior to Admission medications   Medication Sig Start Date End Date Taking? Authorizing Provider  amiodarone (PACERONE) 200 MG tablet Take 200 mg by mouth daily.   Yes Tonny Bollman, MD  aspirin 81 MG EC tablet Take 81 mg by mouth daily.   02/18/11 02/18/12 Yes Dayna N Dunn, PA  busPIRone (BUSPAR) 15 MG tablet Take 15 mg by mouth daily as needed. For mood stabilization. 05/23/11 05/22/12 Yes Romero Belling, MD  calcium-vitamin D (OSCAL WITH D) 500-200 MG-UNIT per tablet Take 1 tablet by mouth 2 (two) times daily with a meal. 07/15/11 07/14/12 Yes Reuel Boom  J Angiulli, PA  cyclobenzaprine (FLEXERIL) 5 MG tablet Take 5 mg by mouth 2 (two) times daily as needed. For muscle spasms.   Yes Historical Provider, MD  ezetimibe (ZETIA) 10 MG tablet Take 10 mg by mouth daily.   Yes Rollene Rotunda, MD  fexofenadine (ALLEGRA) 180 MG tablet Take 180 mg by mouth daily.    Yes Historical Provider, MD  fish oil-omega-3 fatty acids 1000 MG capsule Take 1,000 mg by mouth daily.     Yes Historical Provider, MD  fluticasone (FLONASE) 50 MCG/ACT nasal spray Place 2 sprays into the nose daily as needed. For allergies    Yes Historical Provider, MD  folic acid-pyridoxine-cyancobalamin (FOLTX) 2.5-25-2 MG TABS Take 1 tablet by mouth daily.     Yes Historical Provider, MD  furosemide (LASIX) 20 MG tablet Take 1 tablet (20 mg total) by mouth daily. 07/15/11 07/14/12 Yes Daniel J Angiulli, PA  HYDROcodone-acetaminophen (NORCO) 5-325 MG per tablet Take 1 tablet by mouth every 4 (four) hours as needed. For pain  12/29/10 12/29/11 Yes Etta Grandchild, MD  levothyroxine (SYNTHROID, LEVOTHROID) 175 MCG tablet Take 175 mcg by mouth every morning.   Yes Mcarthur Rossetti Angiulli, PA  lisinopril (PRINIVIL,ZESTRIL) 2.5 MG tablet Take 2.5 mg by mouth daily.   Yes  Historical Provider, MD  methocarbamol (ROBAXIN) 500 MG tablet Take 500 mg by mouth 4 (four) times daily as needed. For spasms    Yes Historical Provider, MD  nitroGLYCERIN (NITROSTAT) 0.4 MG SL tablet Place 0.4 mg under the tongue every 5 (five) minutes as needed. For chest pain  12/17/10 12/17/11 Yes Tonny Bollman, MD  pantoprazole (PROTONIX) 40 MG tablet Take 40 mg by mouth 2 (two) times daily.    Yes Historical Provider, MD  potassium chloride SA (K-DUR,KLOR-CON) 20 MEQ tablet Take 20 mEq by mouth daily.     Yes Historical Provider, MD  senna (SENOKOT) 8.6 MG TABS Take 2 tablets by mouth at bedtime.   Yes Mcarthur Rossetti Angiulli, PA  warfarin (COUMADIN) 3 MG tablet Take 3 mg by mouth every evening.    Yes Rollene Rotunda, MD    Inpatient Medications:     . furosemide  60 mg Intravenous Once  . ondansetron  8 mg Oral STAT    (Not in a hospital admission)  Family History  Problem Relation Age of Onset  . Diabetes Mother   . Diabetes Brother   . Arthritis      family history  . Prostate cancer      Family History     History   Social History  . Marital Status: Widowed    Spouse Name: N/A    Number of Children: N/A  . Years of Education: N/A   Occupational History  . Not on file.   Social History Main Topics  . Smoking status: Former Smoker -- 0.1 packs/day for 33 years    Types: Cigarettes    Quit date: 04/21/2001  . Smokeless tobacco: Never Used   Comment: "smoked ~ 1 pack/month"  . Alcohol Use: Yes     07/05/11 "occasionally; last time was glass of wine last week"  . Drug Use: No  . Sexually Active: Yes   Other Topics Concern  . Not on file   Social History Narrative  . No narrative on file     Review of Systems: General: negative for chills, fever, night sweats or weight changes.  Cardiovascular: positive for shortness of breath, diaphoresis and lightheadedness, negative for chest pain,  dyspnea on exertion, edema, orthopnea, palpitations, paroxysmal nocturnal  dyspnea Dermatological: negative for rash Respiratory: negative for cough or wheezing Urologic: negative for hematuria Abdominal: negative for nausea, vomiting, diarrhea, bright red blood per rectum, melena, or hematemesis Neurologic:  negative for visual changes, syncope, or dizziness All other systems reviewed and are otherwise negative except as noted above.  Physical Exam: Blood pressure 101/60, pulse 72, temperature 97.4 F (36.3 C), temperature source Oral, resp. rate 16, SpO2 100.00%.    General:  Appears older than stated age, well developed, well nourished, in no acute distress. Head: Normocephalic, atraumatic, sclera non-icteric, no xanthomas, nares are without discharge.  Neck: Negative for carotid bruits. JVD mildly 3-4 cm above clavicle. Lungs: Bibasilar rales appreciated. No wheezes or rhonchi. Breathing is unlabored, assisted by NRB.  Heart: RRR with S1 S2. No murmurs, rubs, or gallops appreciated. Abdomen: Soft, non-tender, non-distended with normoactive bowel sounds. No hepatomegaly. No rebound/guarding. No obvious abdominal masses. Msk:  Strength and tone appears normal for age. Extremities: No clubbing, cyanosis or edema.  Distal pedal pulses are 2+ and equal bilaterally. Neuro: Alert and oriented X 3. Moves all extremities spontaneously. Psych:  Responds to questions appropriately with a normal affect.  Labs: Recent Labs  Basename 09/14/11 1149   WBC 3.1*   HGB 13.0   HCT 37.9   MCV 96.4   PLT 174   Lab 09/14/11 1149  NA 140  K 4.2  CL 105  CO2 18*  BUN 9  CREATININE 1.13*  CALCIUM 8.7  PROT --  BILITOT --  ALKPHOS --  ALT --  AST --  AMYLASE --  LIPASE --  GLUCOSE 247*   Recent Labs  Basename 09/14/11 1300   CKTOTAL 101   CKMB 2.2   CKMBINDEX --   TROPONINI <0.30   Radiology/Studies: Dg Chest Port 1 View  09/14/2011  *RADIOLOGY REPORT*  Clinical Data: Shortness of breath.  PORTABLE CHEST - 1 VIEW  Comparison: 03/07/2011.  Findings: Trachea  is midline.  Heart size stable.  AICD lead tip is unchanged in position.  There is mild diffuse bilateral air space disease.  No definite pleural fluid.  Vertebroplasties are noted.  IMPRESSION: Mild diffuse bilateral air space disease likely represents edema.  Original Report Authenticated By: Reyes Ivan, M.D.    EKG: NSR, 72 bpm, Q waves II, III, aVF, V1-V4, mild ST elevation (<1 mm), V3-V4, II, III, aVF consistent with prior tracings  ASSESSMENT:   1. Acute on chronic systolic CHF 2. Ischemic cardiomyopathy 3. CAD 4. LV thrombus 5. History of SVT 6. Paroxysmal atrial fibrillation 7. HTN 8. Hyperlipidemia 7. GERD 8. Hypothyroidism  DISCUSSION/PLAN:   Her presentation today is consistent with her prior CHF exacerbations. A exogenous eliciting factor is unclear. She denies chest pain this morning, or leading up to today. In fact, she had been progressing well with home health PT. No dietary indiscretion, increased fluid intake, increased activity level, new medications, medication noncompliance, etoh/OTC supplements, hypertensive episodes. No palpitations or ICD shock. No evidence of infection. pBNP is elevated and CXR reveals mild bibasilar pulmonary edema. Would favor admission for diuresis. Given cardiac history, will rule out for ACS. Repeat echo given cardiomyopathy and valvular abnormalities noted in the past. Check TSH. Heart healthy diet. Strict I/Os. Daily weights. NSR on telemetry and EKG today. Changes consistent with prior tracings. Will interrogate ICD.  Continue outpatient medications aside from Lasix PO. Will supplement KCl.    Signed, R. Hurman Horn, PA-C 09/14/2011, 3:57 PM  Cardiology Attending  Patient seen and examined. Agree with above exam assessment and plan. History and exam consistent with CHF exacerbation. Will treat with IV lasix. No need for echo with study form Nov. 2012. R/o for MI but I doubt this is an acute coronary syndrome.  Lewayne Bunting,  M.D.

## 2011-09-14 NOTE — ED Provider Notes (Signed)
History     CSN: [id number]  Arrival date & time 09/14/11  1103   First MD Initiated Contact with Patient 09/14/11 1119      Chief Complaint  Patient presents with  . Shortness of Breath    (Consider location/radiation/quality/duration/timing/severity/associated sxs/prior treatment) HPI A LEVEL 5 CAVEAT PERTAINS DUE TO URGENT NEED FOR INTERVENTION Pt presents via EMS with c/o difficulty breathing.  Per EMS O2 sats were in 70s initially, increased to 100% on NRB face mask O2.  Pt states she awoke this morning with sob.  Denies leg swelling, no chest pain.  No recent illness- no fevers or cough. Pt states she takes 20mg  lasix daily- this has been gradually decreased over the past several months  Past Medical History  Diagnosis Date  . Spinal stenosis   . Numbness of foot     left foot  . Diverticulitis     Status post partial colectomy 1/12 with reversal in July 2012  . Colitis, ischemic   . DJD (degenerative joint disease)     History of multiple surgeries to the back, shoulder and knee  . Ischemic cardiomyopathy     Echo 06/16/10: EF 25-30%, anteroseptal and apical hypokinesis, moderate AI, mild MR  . Chronic systolic heart failure   . CAD (coronary artery disease)     a.  Ant MI 8/03 with stenting of the LAD;  b. staged PCI with Cypher DES to OM1 8/03;  c. s/p Cypher DES to LAD 7/04;  d.  multiple cardiac caths in past (chronically abnl ECG);    e. cardiac catheterization 3/12: LAD stent patent, distal LAD 40-50%, small ostial D1 80%, ostial D2 60%, OM-1 stent patent (20-30%), proximal RCA 50%, proximal to mid RCA 40-50%; f. cath 02/17/2011 - nonobs  . LV (left ventricular) mural thrombus     Chronic Coumadin therapy  . CKD (chronic kidney disease), stage III     creatinine:  1.3 in 4/12;    . Tobacco abuse     history  . GERD (gastroesophageal reflux disease)   . Hypertension   . Lower GI bleed     August 2011  . Hypothyroidism   . Aortic insufficiency   .  Pseudoaneurysm     History of, right forearm  . Hyperlipidemia   . OA (osteoarthritis)   . Abnormal EKG     Chronically abnormal EKG.   . Spinal stenosis   . Numbness of foot     Left foot  . Compression fracture 07/04/11    L2  . Anterior myocardial infarction 10/2001  . CHF (congestive heart failure)   . SVT (supraventricular tachycardia)     ? h/o AFib;  patient on long term amiodarone therapy  . PAF (paroxysmal atrial fibrillation)   . Anatomical narrow angle of right eye   . Heart murmur     "age [age]"  . Shortness of breath     "at times; related to CHF"  . Shortness of breath on exertion   . 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 Graciela Husbands.   [name] AICD (automatic cardioverter/defibrillator) present 10/2004    explant; implant  . Blood transfusion   . Anemia   . H/O hiatal hernia   . Stroke     History of TIA and possible CVA; hospitalized 2004; on coumadin    Past Surgical History  Procedure Date  . Back surgery   . Coronary angioplasty with stent placement 10/2001; 09/2002  .  Cardiac defibrillator placement 07/2003; 10/2004  . Lumbar laminectomy/decompression microdiscectomy 10/2001    L5-S1/E-chart  . Knee arthroscopy     left  . Thyroidectomy 1970's  . Total knee arthroplasty 11/2003    left  . X-stop implantation 12/2004    L3-4; L4-5  . Fixation kyphoplasty thoracic spine 07/2007    T3, 4, 6 compression fractures  . Shoulder open rotator cuff repair 04/2008    right  . Lumbar laminectomy/decompression microdiscectomy 01/2010  . Colectomy 03/2010    sigmoid left  . Colostomy 03/2010    transverse  . Peripherally inserted central catheter insertion 03/2010    removed upon discharge  . Colostomy closure 12/2010    reversal  . Tonsillectomy and adenoidectomy 1969  . Appendectomy 03/2010  . Cataract extraction     right    Family History  Problem Relation Age of Onset  . Diabetes Mother   . Diabetes Brother   . Arthritis       family history  . Prostate cancer      Family History    History  Substance Use Topics  . Smoking status: Former Smoker -- 0.1 packs/day for 33 years    Types: Cigarettes    Quit date: 04/21/2001  . Smokeless tobacco: Never Used   Comment: "smoked ~ 1 pack/month"  . Alcohol Use: Yes     07/05/11 "occasionally; last time was glass of wine last week"    OB History    Grav Para Term Preterm Abortions TAB SAB Ect Mult Living                  Review of Systems ROS reviewed and all otherwise negative except for mentioned in HPI  Allergies  Lansoprazole; Penicillins; and Statins  Home Medications   Current Outpatient Rx  Name Route Sig Dispense Refill  . AMIODARONE HCL 200 MG PO TABS Oral Take 200 mg by mouth daily.    . ASPIRIN 81 MG PO TBEC Oral Take 81 mg by mouth daily.      . BUSPIRONE HCL 15 MG PO TABS Oral Take 15 mg by mouth daily as needed. For mood stabilization.    [name] CALCIUM CARBONATE-VITAMIN D 500-200 MG-UNIT PO TABS Oral Take 1 tablet by mouth 2 (two) times daily with a meal. 60 tablet 1  . CYCLOBENZAPRINE HCL 5 MG PO TABS Oral Take 5 mg by mouth 2 (two) times daily as needed. For muscle spasms.    [name] EZETIMIBE 10 MG PO TABS Oral Take 10 mg by mouth daily.    [name] FEXOFENADINE HCL 180 MG PO TABS Oral Take 180 mg by mouth daily.     . OMEGA-3 FATTY ACIDS 1000 MG PO CAPS Oral Take 1,000 mg by mouth daily.      [name] FLUTICASONE PROPIONATE 50 MCG/ACT NA SUSP Nasal Place 2 sprays into the nose daily as needed. For allergies     . FA-PYRIDOXINE-CYANCOBALAMIN 2.5-25-2 MG PO TABS Oral Take 1 tablet by mouth daily.      . FUROSEMIDE 20 MG PO TABS Oral Take 1 tablet (20 mg total) by mouth daily. 30 tablet 1  . HYDROCODONE-ACETAMINOPHEN 5-325 MG PO TABS Oral Take 1 tablet by mouth every 4 (four) hours as needed. For pain     . LEVOTHYROXINE SODIUM 175 MCG PO TABS Oral Take 175 mcg by mouth every morning.    [name] LISINOPRIL 2.5 MG PO TABS Oral Take 2.5 mg by mouth daily.    [name]  METHOCARBAMOL  500 MG PO TABS Oral Take 500 mg by mouth 4 (four) times daily as needed. For spasms     . NITROGLYCERIN 0.4 MG SL SUBL Sublingual Place 0.4 mg under the tongue every 5 (five) minutes as needed. For chest pain     . PANTOPRAZOLE SODIUM 40 MG PO TBEC Oral Take 40 mg by mouth 2 (two) times daily.     [name] POTASSIUM CHLORIDE CRYS ER 20 MEQ PO TBCR Oral Take 20 mEq by mouth daily.      . SENNA 8.6 MG PO TABS Oral Take 2 tablets by mouth at bedtime.    . WARFARIN SODIUM 3 MG PO TABS Oral Take 3 mg by mouth every evening.       BP 101/60  Pulse 72  Temp 97.4 F (36.3 C) (Oral)  Resp 16  SpO2 100% Vitals reviewed Physical Exam Physical Examination: General appearance - alert, ill appearing, and in moderate distress Mental status - alert, oriented to person, place, and time Mouth - mucous membranes moist, pharynx normal without lesions Chest -decreased air movement with crackles at bases bilaterally, increased respiratory effort, speaking in 1-2 word sentences Heart - normal rate, regular rhythm, normal S1, S2, no murmurs, rubs, clicks or gallops Abdomen - soft, nontender, nondistended, no masses or organomegaly Extremities - peripheral pulses normal, no pedal edema, no clubbing or cyanosis Skin - normal coloration and turgor, no rashes  ED Course  Procedures (including critical care time)     Date: 09/14/2011  Rate: 97  Rhythm: sinus rhythm with first degree AV block  QRS Axis: normal  Intervals: PR prolonged  ST/T Wave abnormalities: nonspecific ST/T changes  Conduction Disutrbances:none  Narrative Interpretation: poor r wave progression  Old EKG Reviewed: since prior tracing PR is now prolonged, rate faster  CRITICAL CARE Performed by: Ethelda Chick   Total critical care time: 35  Critical care time was exclusive of separately billable procedures and treating other patients.  Critical care was necessary to treat or prevent imminent or life-threatening  deterioration.  Critical care was time spent personally by me on the following activities: development of treatment plan with patient and/or surrogate as well as nursing, discussions with consultants, evaluation of patient's response to treatment, examination of patient, obtaining history from patient or surrogate, ordering and performing treatments and interventions, ordering and review of laboratory studies, ordering and review of radiographic studies, pulse oximetry and re-evaluation of patient's condition.  2:38 PM discussed with [facility] cardiology - they will see patient in the ED.    3:48 PM pt feels improved in terms of breathing, however does now c/o 3/10 chest pressure.  Nitroglycerin ordered, repeat EKG and cardiac panel.     Date: 09/14/2011 at 15:27  Rate: 72  Rhythm: normal sinus rhythm  QRS Axis: normal  Intervals: normal  ST/T Wave abnormalities: nonspecific ST/T wave changes  Conduction Disutrbances:none  Narrative Interpretation:   Old EKG Reviewed: unchanged   Labs Reviewed  CBC - Abnormal; Notable for the following:    WBC 3.1 (*)     RDW 18.5 (*)     All other components within normal limits  BASIC METABOLIC PANEL - Abnormal; Notable for the following:    CO2 18 (*)     Glucose, Bld 247 (*)     Creatinine, Ser 1.13 (*)     GFR calc non Af Amer 51 (*)     GFR calc Af Amer 60 (*)     All other components within normal limits  PRO B NATRIURETIC PEPTIDE - Abnormal; Notable for the following:    Pro B Natriuretic peptide (BNP) 2120.0 (*)     All other components within normal limits  PROTIME-INR - Abnormal; Notable for the following:    Prothrombin Time 35.3 (*)     INR 3.46 (*)     All other components within normal limits  POCT I-STAT 3, BLOOD GAS (G3+) - Abnormal; Notable for the following:    pO2, Arterial 188.0 (*)     Acid-base deficit 4.0 (*)     All other components within normal limits  URINALYSIS, ROUTINE W REFLEX MICROSCOPIC  CARDIAC PANEL(CRET  KIN+CKTOT+MB+TROPI)  BLOOD GAS, ARTERIAL  URINE CULTURE  CARDIAC PANEL(CRET KIN+CKTOT+MB+TROPI)   Dg Chest Port 1 View  09/14/2011  *RADIOLOGY REPORT*  Clinical Data: Shortness of breath.  PORTABLE CHEST - 1 VIEW  Comparison: 03/07/2011.  Findings: Trachea is midline.  Heart size stable.  AICD lead tip is unchanged in position.  There is mild diffuse bilateral air space disease.  No definite pleural fluid.  Vertebroplasties are noted.  IMPRESSION: Mild diffuse bilateral air space disease likely represents edema.  Original Report Authenticated By: Reyes Ivan, M.D.     1. CHF exacerbation       MDM   Pt presenting with shortness of breath, hx of ischemic cardiomyopathy.  Pt with 100% sats on NRB O2, pulmonary edema on CXR.  Pt dosed with lasix.  She feels better in terms of breathing, during ED course developed some chest pain.  Pain 3/10- relieved with nitroglycerin, no significant changes on EKG.  Pt to be admitted by cardiology service.          Ethelda Chick, MD 09/17/11 [phone]

## 2011-09-14 NOTE — ED Notes (Signed)
Cards MD to bedside for pt assessment.

## 2011-09-14 NOTE — ED Notes (Addendum)
Per EMS pt from home with c/o shortness of breath since this am. Hx of several back fractures. Diminished right side. 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 Pt on NRB- sats came up to 100%. Hx of CHF. Denies pain. BP 127/84 HR 101.

## 2011-09-14 NOTE — Progress Notes (Signed)
Note, carvedilol not included on patient's home medications. On further review, she had been taking carvedilol 12.5mg  PO BID outpatient on follow-up with Dr. Antoine Poche in 05/2011. On asking the patient, she still takes this and has been tolerating it well. Will include in admission orders. This will need to be continued on discharge as part of her heart failure medication regimen.    Jacqulyn Bath, PA-C 09/14/2011 5:36 PM

## 2011-09-14 NOTE — Progress Notes (Signed)
ANTICOAGULATION CONSULT NOTE - Initial Consult  Pharmacy Consult for Coumadin Indication: h/o LV thrombus/PAF  Allergies  Allergen Reactions  . Lansoprazole Nausea Only  . Penicillins Swelling    Throat swells  . Statins Other (See Comments)    cramps    Vital Signs: Temp: 97.4 F (36.3 C) (06/26 1119) Temp src: Oral (06/26 1119) BP: 108/71 mmHg (06/26 1900) Pulse Rate: 62  (06/26 1900)  Labs:  Basename 09/14/11 1540 09/14/11 1300 09/14/11 1149  HGB -- -- 13.0  HCT -- -- 37.9  PLT -- -- 174  APTT -- -- --  LABPROT -- -- 35.3*  INR -- -- 3.46*  HEPARINUNFRC -- -- --  CREATININE -- -- 1.13*  CKTOTAL 97 101 --  CKMB 2.6 2.2 --  TROPONINI <0.30 <0.30 --    The CrCl is unknown because both a height and weight (above a minimum accepted value) are required for this calculation.   Medical History: Past Medical History  Diagnosis Date  . Spinal stenosis   . Numbness of foot     left foot  . Diverticulitis     Status post partial colectomy 1/12 with reversal in July 2012  . Colitis, ischemic   . DJD (degenerative joint disease)     History of multiple surgeries to the back, shoulder and knee  . Ischemic cardiomyopathy     Echo 06/16/10: EF 25-30%, anteroseptal and apical hypokinesis, moderate AI, mild MR  . Chronic systolic heart failure   . CAD (coronary artery disease)     a.  Ant MI 8/03 with stenting of the LAD;  b. staged PCI with Cypher DES to OM1 8/03;  c. s/p Cypher DES to LAD 7/04;  d.  multiple cardiac caths in past (chronically abnl ECG);    e. cardiac catheterization 3/12: LAD stent patent, distal LAD 40-50%, small ostial D1 80%, ostial D2 60%, OM-1 stent patent (20-30%), proximal RCA 50%, proximal to mid RCA 40-50%; f. cath 02/17/2011 - nonobs  . LV (left ventricular) mural thrombus     Chronic Coumadin therapy  . CKD (chronic kidney disease), stage III     creatinine:  1.3 in 4/12;    . Tobacco abuse     history  . GERD (gastroesophageal reflux  disease)   . Hypertension   . Lower GI bleed     August 2011  . Hypothyroidism   . Aortic insufficiency   . Pseudoaneurysm     History of, right forearm  . Hyperlipidemia   . OA (osteoarthritis)   . Abnormal EKG     Chronically abnormal EKG.   . Spinal stenosis   . Numbness of foot     Left foot  . Compression fracture 07/04/11    L2  . Anterior myocardial infarction 10/2001  . CHF (congestive heart failure)   . SVT (supraventricular tachycardia)     ? h/o AFib;  patient on long term amiodarone therapy  . PAF (paroxysmal atrial fibrillation)   . Anatomical narrow angle of right eye   . Heart murmur     "age 41-19"  . Shortness of breath     "at times; related to CHF"  . Shortness of breath on exertion   . 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 Graciela Husbands.   [name] AICD (automatic cardioverter/defibrillator) present 10/2004    explant; implant  . Blood transfusion   . Anemia   . H/O hiatal hernia   . Stroke  History of TIA and possible CVA; hospitalized 2004; on coumadin    Medications:   (Not in a hospital admission)  Assessment: 61 y/o female patient admitted with chest pain, likely HF exacerbation, on chronic coumadin for h/o afib/LV thrombus. INR is supratherapeutic today but patient has not received dose today.  Goal of Therapy:  INR 2-3 Monitor platelets by anticoagulation protocol: Yes   Plan:  No coumadin today and f/u daily protime.  Verlene Mayer, PharmD, BCPS Pager [phone] 09/14/2011,8:49 PM

## 2011-09-14 NOTE — ED Notes (Signed)
Pt states 2/10 CP at this time.  Provided pt with few ice chips.  2nd dose of nitro held at this time due to BP of 101/60.  Pt in agreement with holding dose.  Primary RN aware.

## 2011-09-15 DIAGNOSIS — I509 Heart failure, unspecified: Secondary | ICD-10-CM

## 2011-09-15 LAB — BASIC METABOLIC PANEL
BUN: 13 mg/dL (ref 6–23)
Calcium: 8.7 mg/dL (ref 8.4–10.5)
GFR calc non Af Amer: 39 mL/min — ABNORMAL LOW (ref >90)
Glucose, Bld: 98 mg/dL (ref 70–99)

## 2011-09-15 LAB — CARDIAC PANEL(CRET KIN+CKTOT+MB+TROPI)
CK, MB: 2.2 ng/mL (ref 0.3–4.0)
Total CK: 79 U/L (ref 7–177)
Troponin I: <0.3 ng/mL (ref <0.30)

## 2011-09-15 LAB — PROTIME-INR
INR: 3.24 — ABNORMAL HIGH (ref 0.00–1.49)
Prothrombin Time: 33.6 seconds — ABNORMAL HIGH (ref 11.6–15.2)

## 2011-09-15 LAB — TSH: TSH: 6.745 u[IU]/mL — ABNORMAL HIGH (ref 0.350–4.500)

## 2011-09-15 MED ORDER — FUROSEMIDE 20 MG PO TABS
20.0000 mg | ORAL_TABLET | Freq: Two times a day (BID) | ORAL | Status: DC
  Administered 2011-09-15: 20 mg via ORAL
  Administered 2011-09-15: 11:00:00 via ORAL
  Administered 2011-09-16: 20 mg via ORAL
  Filled 2011-09-15 (×5): qty 1

## 2011-09-15 MED ORDER — WARFARIN SODIUM 1 MG PO TABS
1.0000 mg | ORAL_TABLET | Freq: Once | ORAL | Status: AC
  Administered 2011-09-15: 1 mg via ORAL
  Filled 2011-09-15: qty 1

## 2011-09-15 NOTE — Progress Notes (Signed)
SUBJECTIVE:  Breathing much better.  No pain.   PHYSICAL EXAM Filed Vitals:   09/14/11 2210 09/15/11 0233 09/15/11 0600 09/15/11 0700  BP: 124/50 94/46 90/48  88/46  Pulse: 66 59 49 51  Temp: 99.7 F (37.6 C) 98.8 F (37.1 C)  98.4 F (36.9 C)  TempSrc:  Oral  Oral  Resp: 18 18  17   Height: 5\' 6"  (1.676 m)     Weight: 143 lb 11.2 oz (65.182 kg)   143 lb 6.4 oz (65.046 kg)  SpO2: 100% 100%  100%   General:  No distress Lungs:  Clear Heart:  RRR Abdomen:  Positive bowel sounds, no rebound no guarding Extremities:  No edema.  LABS: Lab Results  Component Value Date   CKTOTAL 79 09/15/2011   CKMB 2.2 09/15/2011   TROPONINI <0.30 09/15/2011   Results for orders placed during the hospital encounter of 09/14/11 (from the past 24 hour(s))  CBC     Status: Abnormal   Collection Time   09/14/11 11:49 AM      Component Value Range   WBC 3.1 (*) 4.0 - 10.5 K/uL   RBC 3.93  3.87 - 5.11 MIL/uL   Hemoglobin 13.0  12.0 - 15.0 g/dL   HCT 56.2  13.0 - 86.5 %   MCV 96.4  78.0 - 100.0 fL   MCH 33.1  26.0 - 34.0 pg   MCHC 34.3  30.0 - 36.0 g/dL   RDW 78.4 (*) 69.6 - 29.5 %   Platelets 174  150 - 400 K/uL  BASIC METABOLIC PANEL     Status: Abnormal   Collection Time   09/14/11 11:49 AM      Component Value Range   Sodium 140  135 - 145 mEq/L   Potassium 4.2  3.5 - 5.1 mEq/L   Chloride 105  96 - 112 mEq/L   CO2 18 (*) 19 - 32 mEq/L   Glucose, Bld 247 (*) 70 - 99 mg/dL   BUN 9  6 - 23 mg/dL   Creatinine, Ser 2.84 (*) 0.50 - 1.10 mg/dL   Calcium 8.7  8.4 - 13.2 mg/dL   GFR calc non Af Amer 51 (*) >90 mL/min   GFR calc Af Amer 60 (*) >90 mL/min  PRO B NATRIURETIC PEPTIDE     Status: Abnormal   Collection Time   09/14/11 11:49 AM      Component Value Range   Pro B Natriuretic peptide (BNP) 2120.0 (*) 0 - 125 pg/mL  PROTIME-INR     Status: Abnormal   Collection Time   09/14/11 11:49 AM      Component Value Range   Prothrombin Time 35.3 (*) 11.6 - 15.2 seconds   INR 3.46 (*) 0.00  - 1.49  POCT I-STAT 3, BLOOD GAS (G3+)     Status: Abnormal   Collection Time   09/14/11 12:11 PM      Component Value Range   pH, Arterial 7.362  7.350 - 7.400   pCO2 arterial 37.9  35.0 - 45.0 mmHg   pO2, Arterial 188.0 (*) 80.0 - 100.0 mmHg   Bicarbonate 21.6  20.0 - 24.0 mEq/L   TCO2 23  0 - 100 mmol/L   O2 Saturation 100.0     Acid-base deficit 4.0 (*) 0.0 - 2.0 mmol/L   Patient temperature 97.6 F     Collection site RADIAL, ALLEN'S TEST ACCEPTABLE     Drawn by Operator     Sample type ARTERIAL  CARDIAC PANEL(CRET KIN+CKTOT+MB+TROPI)     Status: Normal   Collection Time   09/14/11  1:00 PM      Component Value Range   Total CK 101  7 - 177 U/L   CK, MB 2.2  0.3 - 4.0 ng/mL   Troponin I <0.30  <0.30 ng/mL   Relative Index 2.2  0.0 - 2.5  URINALYSIS, ROUTINE W REFLEX MICROSCOPIC     Status: Normal   Collection Time   09/14/11  2:23 PM      Component Value Range   Color, Urine YELLOW  YELLOW   APPearance CLEAR  CLEAR   Specific Gravity, Urine 1.006  1.005 - 1.030   pH 6.0  5.0 - 8.0   Glucose, UA NEGATIVE  NEGATIVE mg/dL   Hgb urine dipstick NEGATIVE  NEGATIVE   Bilirubin Urine NEGATIVE  NEGATIVE   Ketones, ur NEGATIVE  NEGATIVE mg/dL   Protein, ur NEGATIVE  NEGATIVE mg/dL   Urobilinogen, UA 0.2  0.0 - 1.0 mg/dL   Nitrite NEGATIVE  NEGATIVE   Leukocytes, UA NEGATIVE  NEGATIVE  CARDIAC PANEL(CRET KIN+CKTOT+MB+TROPI)     Status: Normal   Collection Time   09/14/11  3:40 PM      Component Value Range   Total CK 97  7 - 177 U/L   CK, MB 2.6  0.3 - 4.0 ng/mL   Troponin I <0.30  <0.30 ng/mL   Relative Index RELATIVE INDEX IS INVALID  0.0 - 2.5  TSH     Status: Abnormal   Collection Time   09/14/11  9:44 PM      Component Value Range   TSH 6.745 (*) 0.350 - 4.500 uIU/mL  CARDIAC PANEL(CRET KIN+CKTOT+MB+TROPI)     Status: Normal   Collection Time   09/14/11  9:58 PM      Component Value Range   Total CK 89  7 - 177 U/L   CK, MB 2.5  0.3 - 4.0 ng/mL   Troponin I <0.30   <0.30 ng/mL   Relative Index RELATIVE INDEX IS INVALID  0.0 - 2.5  CARDIAC PANEL(CRET KIN+CKTOT+MB+TROPI)     Status: Normal   Collection Time   09/15/11  3:08 AM      Component Value Range   Total CK 79  7 - 177 U/L   CK, MB 2.2  0.3 - 4.0 ng/mL   Troponin I <0.30  <0.30 ng/mL   Relative Index RELATIVE INDEX IS INVALID  0.0 - 2.5  PROTIME-INR     Status: Abnormal   Collection Time   09/15/11  3:09 AM      Component Value Range   Prothrombin Time 33.6 (*) 11.6 - 15.2 seconds   INR 3.24 (*) 0.00 - 1.49  BASIC METABOLIC PANEL     Status: Abnormal   Collection Time   09/15/11  3:09 AM      Component Value Range   Sodium 138  135 - 145 mEq/L   Potassium 4.5  3.5 - 5.1 mEq/L   Chloride 100  96 - 112 mEq/L   CO2 28  19 - 32 mEq/L   Glucose, Bld 98  70 - 99 mg/dL   BUN 13  6 - 23 mg/dL   Creatinine, Ser 1.61 (*) 0.50 - 1.10 mg/dL   Calcium 8.7  8.4 - 09.6 mg/dL   GFR calc non Af Amer 39 (*) >90 mL/min   GFR calc Af Amer 45 (*) >90 mL/min    Intake/Output Summary (Last  24 hours) at 09/15/11 0827 Last data filed at 09/15/11 0749  Gross per 24 hour  Intake    120 ml  Output   1925 ml  Net  -1805 ml     ASSESSMENT AND PLAN:  Acute on chronic systolic CHF (congestive heart failure):  Much better today.  I think that she gets SOB.  Her BP rises and then the horse is out of the barn.  We talked about taking a SLNTG at home when this happens.  Today I will switch to PO Lasix.  HYPERTENSION:  BP is low.    CAD (coronary artery disease):  Ruled out.  No further ischemia work up.    PAF (paroxysmal atrial fibrillation): Continue amiodarone.      Rollene Rotunda 09/15/2011 8:27 AM

## 2011-09-15 NOTE — Clinical Documentation Improvement (Signed)
RESPIRATORY FAILURE DOCUMENTATION CLARIFICATION QUERY   THIS DOCUMENT IS NOT A PERMANENT PART OF THE MEDICAL RECORD   Please update your documentation within the medical record to reflect your response to this query.                                                                                     09/15/11  Dr. Antoine Poche and/or Associates,  In a better effort to capture your patient's severity of illness, reflect appropriate length of stay and utilization of resources, a review of the patient medical record has revealed the following indicators:   "Shortness of Breath  HPI  A LEVEL 5 CAVEAT PERTAINS DUE TO URGENT NEED FOR INTERVENTION  Pt presents via EMS with c/o difficulty breathing. Per EMS O2 sats were in 70s initially, increased to 100% on NRB face mask O2. Pt states she awoke this morning with sob.  Chest -decreased air movement with crackles at bases bilaterally, increased respiratory effort, speaking in 1-2 word sentences Pt with 100% sats on NRB O2," Ethelda Chick, MD 09/14/11 1127     ED H&P  Per EMS pt from home with c/o shortness of breath since this am. Hx of several back fractures. Diminished right side. 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 Pt on NRB- sats came up to 100%. Hx of CHF. Denies pain. BP 127/84 HR 101. Estill Bakes, RN filed at 09/14/11 1106  "Upon ED arrival, pBNP is elevated at 2120.0. CXR reveals mild bibasilar opacities consistent with pulmonary edema. Cardiac biomarkers negative x 1. Cr 1.13 consistent with baseline. Neutropenic at 3.1. INR supratherapeutic at 3.46. She received Lasix 60mg  IV x 1 with good output (~1L) and improvement in dyspnea.  Lungs: Bibasilar rales appreciated. No wheezes or rhonchi. Breathing is unlabored, assisted by NRB." R. Hurman Horn, PA-C  09/14/2011, 3:57 PM  Lewayne Bunting, M.D.   Based on your clinical judgment, please document in the progress notes and discharge summary if a condition below provides greater specificity  regarding the patient's respiratory status and treatment on admission:   - Acute Hypoxic Respiratory Failure requiring 100% NRB, resolved   - Other Condition   - Unable to Clinically Determine    The fact that a query is asked, does not imply that any particular answer is desired or expected.  Reviewed: additional documentation in the medical record  Thank You,  Jerral Ralph  RN BSN CCDS Certified Clinical Documentation Specialist: Cell   [phone]  Health Information Management [place]   TO RESPOND TO THE THIS QUERY, FOLLOW THE INSTRUCTIONS BELOW:  1. If needed, update documentation for the patient's encounter via the notes activity.  2. Access this query again and click edit on the In [employer].  3. After updating, or not, click F2 to complete all highlighted (required) fields concerning your review. Select "additional documentation in the medical record" OR "no additional documentation provided".  4. Click Sign note button.  5. The deficiency will fall out of your In Basket *Please let us know if you are not able to complete this workflow by phone or e-mail (listed below).

## 2011-09-15 NOTE — Progress Notes (Signed)
ANTICOAGULATION CONSULT NOTE - Follow Up Consult  Pharmacy Consult for Coumadin Indication: h/o LV thrombus/PAF  Allergies  Allergen Reactions  . Lansoprazole Nausea Only  . Penicillins Swelling    Throat swells  . Statins Other (See Comments)    cramps    Patient Measurements: Height: 5\' 6"  (167.6 cm) Weight: 143 lb 6.4 oz (65.046 kg) (bed scale) IBW/kg (Calculated) : 59.3   Vital Signs: Temp: 98.4 F (36.9 C) (06/27 0700) Temp src: Oral (06/27 0700) BP: 88/46 mmHg (06/27 0700) Pulse Rate: 51  (06/27 0700)  Labs:  Basename 09/15/11 0309 09/15/11 0308 09/14/11 2158 09/14/11 1540 09/14/11 1149  HGB -- -- -- -- 13.0  HCT -- -- -- -- 37.9  PLT -- -- -- -- 174  APTT -- -- -- -- --  LABPROT 33.6* -- -- -- 35.3*  INR 3.24* -- -- -- 3.46*  HEPARINUNFRC -- -- -- -- --  CREATININE 1.43* -- -- -- 1.13*  CKTOTAL -- 79 89 97 --  CKMB -- 2.2 2.5 2.6 --  TROPONINI -- <0.30 <0.30 <0.30 --    Estimated Creatinine Clearance: 38.7 ml/min (by C-G formula based on Cr of 1.43).   Assessment: 61 yo F admitted with CHF exacerbation.  On chronic Coumadin therapy for LV mural thrombus and PAF.  INR supratherapeutic on admission (3.46), and now decreased to 3.24 (remains slightly supratherapeutic).  No overt bleeding noted, CBC ok 6/26.  Home Coumadin dose was 3 mg po daily and last dose was 6/25.    Goal of Therapy:  INR 2-3 Monitor platelets by anticoagulation protocol: Yes   Plan:  1.  Coumadin 1 mg po x 1 tonight 2.  Continue daily PT/INR  Rolland Porter, Pharm.D., BCPS Clinical Pharmacist Pager: [phone] 09/15/2011,9:40 AM

## 2011-09-15 NOTE — Progress Notes (Signed)
Pt had BP 90/48, HR 49, no S/S pt just resting; called on call MD, ordered to hold dose of Lasix until seen by admitting MD, will continue to monitor, Thanks, Lavonda Jumbo RN

## 2011-09-15 NOTE — Progress Notes (Deleted)
Nursing Note: MD made aware of pt stating "I would rather die if Im going to be in this condition". No orders received. Pt is stable at this time with daughter at bedside. MD stated "I will come evaluate pt". Will continue to monitor pt. Aleen Marston Scientist, clinical (histocompatibility and immunogenetics).

## 2011-09-15 NOTE — Progress Notes (Signed)
UR Completed. Hunter Pinkard, RN, Nurse Case Manager [phone]     

## 2011-09-16 LAB — BASIC METABOLIC PANEL
Calcium: 9.1 mg/dL (ref 8.4–10.5)
Creatinine, Ser: 1.6 mg/dL — ABNORMAL HIGH (ref 0.50–1.10)
GFR calc non Af Amer: 34 mL/min — ABNORMAL LOW (ref >90)
Glucose, Bld: 95 mg/dL (ref 70–99)
Sodium: 134 mEq/L — ABNORMAL LOW (ref 135–145)

## 2011-09-16 LAB — PROTIME-INR
INR: 2.71 — ABNORMAL HIGH (ref 0.00–1.49)
Prothrombin Time: 29.2 seconds — ABNORMAL HIGH (ref 11.6–15.2)

## 2011-09-16 LAB — URINE CULTURE
Colony Count: NO GROWTH
Culture: NO GROWTH

## 2011-09-16 MED ORDER — CARVEDILOL 12.5 MG PO TABS: 12.5000 mg | ORAL_TABLET | Freq: Two times a day (BID) | ORAL | Status: DC

## 2011-09-16 MED ORDER — WARFARIN SODIUM 3 MG PO TABS
3.0000 mg | ORAL_TABLET | Freq: Once | ORAL | Status: DC
  Filled 2011-09-16: qty 1

## 2011-09-16 NOTE — Progress Notes (Signed)
Nursing Note:Pt has received heart failure teaching. Pt verbalized understanding. CHF booklet given to pt. Will continue to monitor pt. [name] Scientist, clinical (histocompatibility and immunogenetics).

## 2011-09-16 NOTE — Progress Notes (Signed)
ANTICOAGULATION CONSULT NOTE - Follow Up Consult  Pharmacy Consult for Coumadin Indication: h/o LV thrombus/PAF  Allergies  Allergen Reactions  . Lansoprazole Nausea Only  . Penicillins Swelling    Throat swells  . Statins Other (See Comments)    cramps    Patient Measurements: Height: 5\' 6"  (167.6 cm) Weight: 143 lb 11.2 oz (65.182 kg) (scale B) IBW/kg (Calculated) : 59.3   Vital Signs: Temp: 98.3 F (36.8 C) (06/28 0617) Temp src: Oral (06/28 0617) BP: 105/55 mmHg (06/28 0617) Pulse Rate: 54  (06/28 0617)  Labs:  Alvira Philips 09/16/11 9562 09/15/11 0309 09/15/11 0308 09/14/11 2158 09/14/11 1540 09/14/11 1149  HGB -- -- -- -- -- 13.0  HCT -- -- -- -- -- 37.9  PLT -- -- -- -- -- 174  APTT -- -- -- -- -- --  LABPROT 29.2* 33.6* -- -- -- 35.3*  INR 2.71* 3.24* -- -- -- 3.46*  HEPARINUNFRC -- -- -- -- -- --  CREATININE 1.60* 1.43* -- -- -- 1.13*  CKTOTAL -- -- 79 89 97 --  CKMB -- -- 2.2 2.5 2.6 --  TROPONINI -- -- <0.30 <0.30 <0.30 --    Estimated Creatinine Clearance: 34.6 ml/min (by C-G formula based on Cr of 1.6).   Assessment: 61 yo F admitted with CHF exacerbation.  On chronic Coumadin therapy for LV mural thrombus and PAF.  INR supratherapeutic on admission (3.46), and now decreased to 2.71. CBC ok 6/26.  Home Coumadin dose was 3 mg po daily and yesterday patient received 1mg  after no dose on 6/26.    Goal of Therapy:  INR 2-3 Monitor platelets by anticoagulation protocol: Yes   Plan:  1.  Coumadin 3 mg po x 1 tonight 2.  Continue daily PT/INR  Wendie Simmer, PharmD, BCPS Clinical Pharmacist  Pager: [phone]

## 2011-09-16 NOTE — Progress Notes (Signed)
Nursing Note: Pt's foley catheter discontinued per MD's order. Pt educated to make nurse aware when urinate. Urine hat in commode. Will continue to monitor pt. [name] Scientist, clinical (histocompatibility and immunogenetics).

## 2011-09-16 NOTE — Progress Notes (Signed)
Nursing Note: Pt to be discharged home. IV and cardiac monitor discontinued. Pt stable with no complaints. Pt has received all discharge instructions and verbalized understanding. Will escort pt to car safely. Caylor Cerino Scientist, clinical (histocompatibility and immunogenetics).

## 2011-09-16 NOTE — Progress Notes (Addendum)
    SUBJECTIVE:  Breathing back to baseline.  Ambulated.  Eating OK.  No pain.   PHYSICAL EXAM Filed Vitals:   09/15/11 1439 09/15/11 1816 09/15/11 2125 09/16/11 0617  BP: 90/51 84/48 98/52  105/55  Pulse: 64 57 52 54  Temp: 98.6 F (37 C)  99.1 F (37.3 C) 98.3 F (36.8 C)  TempSrc: Oral  Oral Oral  Resp: 18  18 16   Height:      Weight:    143 lb 11.2 oz (65.182 kg)  SpO2: 97%  100% 100%   General:  No distress Lungs:  Clear Heart:  RRR Abdomen:  Positive bowel sounds, no rebound no guarding Extremities:  No edema.  LABS: Lab Results  Component Value Date   CKTOTAL 79 09/15/2011   CKMB 2.2 09/15/2011   TROPONINI <0.30 09/15/2011   No results found for this or any previous visit (from the past 24 hour(s)).  Intake/Output Summary (Last 24 hours) at 09/16/11 0645 Last data filed at 09/16/11 1610  Gross per 24 hour  Intake   1323 ml  Output   1475 ml  Net   -152 ml     ASSESSMENT AND PLAN:  Acute on chronic systolic CHF (congestive heart failure)/Acute Hypoxic Respiratory Failure requiring 100% NRB, resolved:  Baseline.  OK to discharge on 20 bid of Lasix.  Check the BMET before discharge.  She can get a BMET on Monday when she is seen in our office.    HYPERTENSION:  BP is low.  However, OK to continue current meds.  Of note she will take NTGSL if she has these episodes in the future as her BP is usually rising when this occurs.  CAD (coronary artery disease):  Ruled out.  No further ischemia work up.    PAF (paroxysmal atrial fibrillation): Continue amiodarone.      [name] [facility] 09/16/2011 6:45 AM

## 2011-09-16 NOTE — Discharge Instructions (Signed)
***  PLEASE REMEMBER TO BRING ALL OF YOUR MEDICATIONS TO EACH OF YOUR FOLLOW-UP OFFICE VISITS.  

## 2011-09-16 NOTE — Discharge Summary (Addendum)
Patient ID: [name],  MRN: [id number], DOB/AGE: 09-21-1950 61 y.o.  Admit date: 09/14/2011 Discharge date: 09/16/2011  Primary Care Provider: Sanda Linger Primary Cardiologist: J. Afua Hoots, MD  Discharge Diagnoses Principal Problem:  Respiratory failure Active problems   Acute on chronic systolic CHF (congestive heart failure  Ischemic cardiomyopathy  LV (left ventricular) mural thrombus  HYPERLIPIDEMIA  HYPERTENSION  CKD (chronic kidney disease)  CAD (coronary artery disease)  PAF (paroxysmal atrial fibrillation)  HYPOTHYROIDISM  GERD (gastroesophageal reflux disease)  implantable cardiac defibrillator- [employer]-single-chamber   Allergies Allergies  Allergen Reactions  . Lansoprazole Nausea Only  . Penicillins Swelling    Throat swells  . Statins Other (See Comments)    cramps    Procedures  None  History of Present Illness  61 y/o female with history of CAD s/p prior stenting, ICM s/p AICD, and chronic systolic CHF with an EF of 25-30% who was in her USOH until the morning of admission when she awoke with sudden onset of dyspnea associated with diaphoresis and lightheadedness.  She called EMS and was taken to the [facility] ED.  There, her pBNP was elevated @ 2120 and XCR revelaed mild bibasilar opacities consistent with pulmonary edema.  CE were negative and INR was supratherapeutic.  She was treated with Lasix 60mg  IV x 1 with 1 liter of output and was admitted to cardiology for further evaluation.  Hospital Course  Following admission, pts home dose of lasix was increased from 20mg  daily to 20mg  bid.  She did not require any additional IV lasix and her respiratory status steadily improved.  It was felt that her Ss may have been driven by acute hypertension as she did not have any weight gain, edema, or orthopnea leading up to her event.  This being the case, it was recommended that she use SL NTG in the future for similar symptoms.    Though her weight has  remained 143 lbs throughout admission, she has had a net negative I & O of approximately 5.5 liters.  In this setting, she has also had rise in her creatinine from 1.13 on admission to 1.43 on 6/27, and 1.60 this AM.  As a result, we will reduce her home lasix dose back to 20mg  daily @ the time of discharge.  We have arranged for repeat BMET on Monday 7/1, when she will be seen in clinic.  She will also have an INR on that day.  She is being discharged home today in good condition.  Discharge Vitals Blood pressure 105/55, pulse 53, temperature 98.3 F (36.8 C), temperature source Oral, resp. rate 16, height 5\' 6"  (1.676 m), weight 143 lb 11.2 oz (65.182 kg), SpO2 100.00%.  Filed Weights   09/14/11 2210 09/15/11 0700 09/16/11 0617  Weight: 143 lb 11.2 oz (65.182 kg) 143 lb 6.4 oz (65.046 kg) 143 lb 11.2 oz (65.182 kg)   Labs  CBC  Basename 09/14/11 1149  WBC 3.1*  NEUTROABS --  HGB 13.0  HCT 37.9  MCV 96.4  PLT 174   Basic Metabolic Panel  Basename 09/16/11 0634 09/15/11 0309  NA 134* 138  K 4.2 4.5  CL 96 100  CO2 29 28  GLUCOSE 95 98  BUN 17 13  CREATININE 1.60* 1.43*  CALCIUM 9.1 8.7  MG -- --  PHOS -- --   Cardiac Enzymes  Basename 09/15/11 0308 09/14/11 2158 09/14/11 1540  CKTOTAL 79 89 97  CKMB 2.2 2.5 2.6  CKMBINDEX -- -- --  TROPONINI <  0.30 <0.30 <0.30   Thyroid Function Tests  Basename 09/14/11 2144  TSH 6.745*  T4TOTAL --  T3FREE --  THYROIDAB --   Disposition  Pt is being discharged home today in good condition.  Follow-up Plans & Appointments  Follow-up Information    Follow up with Norma Fredrickson, NP on 09/19/2011. (2:30 PM.  We will check a blood chemistry also.)    Contact information:   [address] [place] [phone]      Follow up with [facility] on 09/19/2011. (2:45 PM)    Contact information:   [address] [place] [phone]        Discharge Medications  Medication List  As of 09/16/2011   2:30 PM   TAKE these medications         amiodarone 200 MG tablet   Commonly known as: PACERONE   Take 200 mg by mouth daily.      aspirin 81 MG EC tablet   Take 81 mg by mouth daily.      busPIRone 15 MG tablet   Commonly known as: BUSPAR   Take 15 mg by mouth daily as needed. For mood stabilization.      calcium-vitamin D 500-200 MG-UNIT per tablet   Commonly known as: OSCAL WITH D   Take 1 tablet by mouth 2 (two) times daily with a meal.      carvedilol 12.5 MG tablet   Commonly known as: COREG   Take 1 tablet (12.5 mg total) by mouth 2 (two) times daily with a meal.      cyclobenzaprine 5 MG tablet   Commonly known as: FLEXERIL   Take 5 mg by mouth 2 (two) times daily as needed. For muscle spasms.      ezetimibe 10 MG tablet   Commonly known as: ZETIA   Take 10 mg by mouth daily.      fexofenadine 180 MG tablet   Commonly known as: ALLEGRA   Take 180 mg by mouth daily.      fish oil-omega-3 fatty acids 1000 MG capsule   Take 1,000 mg by mouth daily.      fluticasone 50 MCG/ACT nasal spray   Commonly known as: FLONASE   Place 2 sprays into the nose daily as needed. For allergies        folic acid-pyridoxine-cyancobalamin 2.5-25-2 MG Tabs   Commonly known as: FOLTX   Take 1 tablet by mouth daily.      furosemide 20 MG tablet   Commonly known as: LASIX   Take 1 tablet (20 mg total) by mouth daily.      HYDROcodone-acetaminophen 5-325 MG per tablet   Commonly known as: NORCO   Take 1 tablet by mouth every 4 (four) hours as needed. For pain      levothyroxine 175 MCG tablet   Commonly known as: SYNTHROID, LEVOTHROID   Take 175 mcg by mouth every morning.      lisinopril 2.5 MG tablet   Commonly known as: PRINIVIL,ZESTRIL   Take 2.5 mg by mouth daily.      methocarbamol 500 MG tablet   Commonly known as: ROBAXIN   Take 500 mg by mouth 4 (four) times daily as needed. For spasms      nitroGLYCERIN 0.4 MG SL tablet   Commonly known as: NITROSTAT   Place  0.4 mg under the tongue every 5 (five) minutes as needed. For chest pain      pantoprazole  40 MG tablet   Commonly known as: PROTONIX   Take 40 mg by mouth 2 (two) times daily.      potassium chloride SA 20 MEQ tablet   Commonly known as: K-DUR,KLOR-CON   Take 20 mEq by mouth daily.      senna 8.6 MG Tabs   Commonly known as: SENOKOT   Take 2 tablets by mouth at bedtime.      warfarin 3 MG tablet   Commonly known as: COUMADIN   Take 3 mg by mouth every evening.           Outstanding Labs/Studies  Follow-up BMET & INR on 10-08-2011.  Duration of Discharge Encounter   Greater than 30 minutes including physician time.  Signed, Nicolasa Ducking NP 09/16/2011, 2:30 PM   Patient seen and examined.  Plan as discussed in my rounding note for today and outlined above. We will reduce the Lasix to daily given the bump in creat.   Rollene Rotunda  09/16/2011  2:49 PM

## 2011-09-19 ENCOUNTER — Ambulatory Visit (INDEPENDENT_AMBULATORY_CARE_PROVIDER_SITE_OTHER): Payer: Medicare Other | Admitting: *Deleted

## 2011-09-19 ENCOUNTER — Encounter: Payer: Self-pay | Admitting: Nurse Practitioner

## 2011-09-19 ENCOUNTER — Other Ambulatory Visit (INDEPENDENT_AMBULATORY_CARE_PROVIDER_SITE_OTHER): Payer: Medicare Other

## 2011-09-19 ENCOUNTER — Telehealth: Payer: Self-pay

## 2011-09-19 ENCOUNTER — Ambulatory Visit (INDEPENDENT_AMBULATORY_CARE_PROVIDER_SITE_OTHER): Payer: Medicare Other | Admitting: Nurse Practitioner

## 2011-09-19 VITALS — BP 122/84 | HR 63 | Ht 66.0 in | Wt 141.2 lb

## 2011-09-19 DIAGNOSIS — R0989 Other specified symptoms and signs involving the circulatory and respiratory systems: Secondary | ICD-10-CM

## 2011-09-19 DIAGNOSIS — I2589 Other forms of chronic ischemic heart disease: Secondary | ICD-10-CM

## 2011-09-19 DIAGNOSIS — R0609 Other forms of dyspnea: Secondary | ICD-10-CM

## 2011-09-19 DIAGNOSIS — E039 Hypothyroidism, unspecified: Secondary | ICD-10-CM

## 2011-09-19 DIAGNOSIS — R0600 Dyspnea, unspecified: Secondary | ICD-10-CM

## 2011-09-19 DIAGNOSIS — I5022 Chronic systolic (congestive) heart failure: Secondary | ICD-10-CM

## 2011-09-19 DIAGNOSIS — I255 Ischemic cardiomyopathy: Secondary | ICD-10-CM

## 2011-09-19 MED ORDER — LEVOTHYROXINE SODIUM 200 MCG PO TABS: 200.0000 ug | ORAL_TABLET | Freq: Every day | ORAL | Status: DC

## 2011-09-19 NOTE — Telephone Encounter (Signed)
[name] called requesting verbal orders for OT - energy conservation and breathing techniques.

## 2011-09-19 NOTE — Telephone Encounter (Signed)
yes

## 2011-09-19 NOTE — Assessment & Plan Note (Signed)
Patient presents following recent admission for heart failure exacerbation. Seen back today for an early visit. She seems well compensated. No trigger identified per her. We will recheck a BMET and a BNP today. Salt restriction continues to be advised. She is to weigh daily and we reviewed how to adjust her diuretics if needed. She is just on low dose ACE. She is on Coreg. I will see her back in about 2 weeks and see if we can't start titrating her medicines up. Thyroid medicine is adjusted today as well. I will see her back in 2 weeks. Patient is agreeable to this plan and will call if any problems develop in the interim.

## 2011-09-19 NOTE — Telephone Encounter (Signed)
Verbal authorization given via VM 

## 2011-09-19 NOTE — Progress Notes (Signed)
[name] Sample Date of Birth: Mar 02, 1951 Medical Record #[id number]  History of Present Illness: [name] is seen today for a post hospital visit. She is seen for Dr. Antoine Poche. She has multiple medical issues which include an ischemic CM with an EF of 25 to 30%, remote MI with PCI, functioning ICD, HTN, HLD, chronic anticoagulation for a mural thrombus, prior stroke, multiple back surgeries, GERD and diverticulitis with prior GI bleed. She has a chronically abnormal EKG with ST elevation as a baseline.   She comes in today. She is here alone. Was just discharged this past Friday after a 3 day stay for CHF. States her symptoms came on quite abruptly, which is typical for her. She could hear all the gurgling in her chest and was coughing up a frothy sputum. No trigger that she could identify. She was diuresed. Acute HTN was felt to be a contributing factor. She remains on her same medicines. Feels ok now. Not short of breath. TSH was elevated. She says she has been on her current dose of her thyroid medicine since December. Coumadin remains somewhat labile. She says her diet is consisted. She does have alcohol use noted in her history as well.   Current Outpatient Prescriptions on File Prior to Visit  Medication Sig Dispense Refill  . amiodarone (PACERONE) 200 MG tablet Take 200 mg by mouth daily.      [name] aspirin 81 MG EC tablet Take 81 mg by mouth daily.        . busPIRone (BUSPAR) 15 MG tablet Take 15 mg by mouth daily as needed. For mood stabilization.      . calcium-vitamin D (OSCAL WITH D) 500-200 MG-UNIT per tablet Take 1 tablet by mouth 2 (two) times daily with a meal.  60 tablet  1  . carvedilol (COREG) 12.5 MG tablet Take 1 tablet (12.5 mg total) by mouth 2 (two) times daily with a meal.  60 tablet  6  . cyclobenzaprine (FLEXERIL) 5 MG tablet Take 5 mg by mouth 2 (two) times daily as needed. For muscle spasms.      [name] ezetimibe (ZETIA) 10 MG tablet Take 10 mg by mouth daily.      .  fexofenadine (ALLEGRA) 180 MG tablet Take 180 mg by mouth daily.       . fish oil-omega-3 fatty acids 1000 MG capsule Take 1,000 mg by mouth daily.        . fluticasone (FLONASE) 50 MCG/ACT nasal spray Place 2 sprays into the nose daily as needed. For allergies       . folic acid-pyridoxine-cyancobalamin (FOLTX) 2.5-25-2 MG TABS Take 1 tablet by mouth daily.        . furosemide (LASIX) 20 MG tablet Take 1 tablet (20 mg total) by mouth daily.  30 tablet  1  . HYDROcodone-acetaminophen (NORCO) 5-325 MG per tablet Take 1 tablet by mouth every 4 (four) hours as needed. For pain       . lisinopril (PRINIVIL,ZESTRIL) 2.5 MG tablet Take 2.5 mg by mouth 2 (two) times daily.       . nitroGLYCERIN (NITROSTAT) 0.4 MG SL tablet Place 0.4 mg under the tongue every 5 (five) minutes as needed. For chest pain       . pantoprazole (PROTONIX) 40 MG tablet Take 40 mg by mouth 2 (two) times daily.       . potassium chloride SA (K-DUR,KLOR-CON) 20 MEQ tablet Take 20 mEq by mouth daily.        [name]  senna (SENOKOT) 8.6 MG TABS Take 2 tablets by mouth at bedtime.      [name] warfarin (COUMADIN) 3 MG tablet Take 3 mg by mouth every evening.       . methocarbamol (ROBAXIN) 500 MG tablet Take 500 mg by mouth 4 (four) times daily as needed. For spasms         Allergies  Allergen Reactions  . Lansoprazole Nausea Only  . Penicillins Swelling    Throat swells  . Statins Other (See Comments)    cramps    Past Medical History  Diagnosis Date  . Spinal stenosis   . Numbness of foot     left foot  . Diverticulitis     Status post partial colectomy 1/12 with reversal in July 2012  . Colitis, ischemic   . DJD (degenerative joint disease)     History of multiple surgeries to the back, shoulder and knee  . Ischemic cardiomyopathy     Echo 06/16/10: EF 25-30%, anteroseptal and apical hypokinesis, moderate AI, mild MR  . Chronic systolic heart failure   . CAD (coronary artery disease)     a.  Ant MI 8/03 with stenting of the  LAD;  b. staged PCI with Cypher DES to OM1 8/03;  c. s/p Cypher DES to LAD 7/04;  d.  multiple cardiac caths in past (chronically abnl ECG);    e. cardiac catheterization 3/12: LAD stent patent, distal LAD 40-50%, small ostial D1 80%, ostial D2 60%, OM-1 stent patent (20-30%), proximal RCA 50%, proximal to mid RCA 40-50%; f. cath 02/17/2011 - nonobs  . LV (left ventricular) mural thrombus     Chronic Coumadin therapy  . CKD (chronic kidney disease), stage III     creatinine:  1.3 in 4/12;    . Tobacco abuse     history  . GERD (gastroesophageal reflux disease)   . Hypertension   . Lower GI bleed     August 2011  . Hypothyroidism   . Aortic insufficiency   . Pseudoaneurysm     History of, right forearm  . Hyperlipidemia   . OA (osteoarthritis)   . Abnormal EKG     Chronically abnormal EKG.   . Spinal stenosis   . Numbness of foot     Left foot  . Compression fracture 07/04/11    L2  . Anterior myocardial infarction 10/2001  . SVT (supraventricular tachycardia)     ? h/o AFib;  patient on long term amiodarone therapy  . PAF (paroxysmal atrial fibrillation)   . Anatomical narrow angle of right eye   . Heart murmur     "age 65-19"  . Shortness of breath     "at times; related to CHF"  . Shortness of breath on exertion   . 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 Graciela Husbands.   [name] AICD (automatic cardioverter/defibrillator) present 10/2004    explant; implant  . Blood transfusion   . Anemia   . H/O hiatal hernia   . Stroke     History of TIA and possible CVA; hospitalized 2004; on coumadin  . Systolic heart failure     Past Surgical History  Procedure Date  . Back surgery   . Coronary angioplasty with stent placement 10/2001; 09/2002  . Cardiac defibrillator placement 07/2003; 10/2004  . Lumbar laminectomy/decompression microdiscectomy 10/2001    L5-S1/E-chart  . Knee arthroscopy     left  . Thyroidectomy 1970's  . Total knee arthroplasty  11/2003      left  . X-stop implantation 12/2004    L3-4; L4-5  . Fixation kyphoplasty thoracic spine 07/2007    T3, 4, 6 compression fractures  . Shoulder open rotator cuff repair 04/2008    right  . Lumbar laminectomy/decompression microdiscectomy 01/2010  . Colectomy 03/2010    sigmoid left  . Colostomy 03/2010    transverse  . Peripherally inserted central catheter insertion 03/2010    removed upon discharge  . Colostomy closure 12/2010    reversal  . Tonsillectomy and adenoidectomy 1969  . Appendectomy 03/2010  . Cataract extraction     right    History  Smoking status  . Former Smoker -- 0.1 packs/day for 33 years  . Types: Cigarettes  . Quit date: 04/21/2001  Smokeless tobacco  . Never Used  Comment: "smoked ~ 1 pack/month"    History  Alcohol Use  . Yes    07/05/11 "occasionally; last time was glass of wine last week"    Family History  Problem Relation Age of Onset  . Diabetes Mother   . Diabetes Brother   . Arthritis      family history  . Prostate cancer      Family History    Review of Systems: The review of systems is per the HPI.  All other systems were reviewed and are negative.  Physical Exam: BP 122/84  Pulse 63  Ht 5\' 6"  (1.676 m)  Wt 141 lb 3.2 oz (64.048 kg)  BMI 22.79 kg/m2 Patient is pleasant and in no acute distress. Skin is warm and dry. Color is normal.  HEENT is unremarkable. Normocephalic/atraumatic. PERRL. Sclera are nonicteric. Neck is supple. No masses. No JVD. Lungs are clear. Cardiac exam shows a regular rate and rhythm. Abdomen is soft. Extremities are without edema. Gait and ROM are intact. No gross neurologic deficits noted.   LABORATORY DATA: Repeat BMET & BNP are pending for today.   Lab Results  Component Value Date   WBC 3.1* 09/14/2011   HGB 13.0 09/14/2011   HCT 37.9 09/14/2011   PLT 174 09/14/2011   GLUCOSE 95 09/16/2011   CHOL 209* 02/18/2011   TRIG 56 02/18/2011   HDL 104 02/18/2011   LDLCALC 94 02/18/2011   ALT 16  07/11/2011   AST 27 07/11/2011   NA 134* 09/16/2011   K 4.2 09/16/2011   CL 96 09/16/2011   CREATININE 1.60* 09/16/2011   BUN 17 09/16/2011   CO2 29 09/16/2011   TSH 6.745* 09/14/2011   INR 1.4 09/19/2011   HGBA1C 5.1 02/17/2011     Assessment / Plan:

## 2011-09-19 NOTE — Patient Instructions (Addendum)
I have increased your Levothyroxine to 200 mcg per day   We are checking labs today  I want to see you in 2 weeks. I hope to try and increase your heart failure medicines to help with the weakness of your heart muscle (lisinopril and/or Coreg)  Call the [facility] Care office at [phone] if you have any questions, problems or concerns.

## 2011-09-20 LAB — BRAIN NATRIURETIC PEPTIDE: Pro B Natriuretic peptide (BNP): 178 pg/mL — ABNORMAL HIGH (ref 0.0–100.0)

## 2011-09-21 ENCOUNTER — Telehealth: Payer: Self-pay | Admitting: Cardiology

## 2011-09-21 DIAGNOSIS — I5022 Chronic systolic (congestive) heart failure: Secondary | ICD-10-CM

## 2011-09-21 NOTE — Telephone Encounter (Signed)
Fu call °Patient returning your call about test results °

## 2011-09-21 NOTE — Telephone Encounter (Signed)
I spoke with the pt and made her aware of BNP results. The pt will come into the office on 09/23/11 for BMP.

## 2011-09-23 ENCOUNTER — Ambulatory Visit (INDEPENDENT_AMBULATORY_CARE_PROVIDER_SITE_OTHER): Payer: Medicare Other | Admitting: *Deleted

## 2011-09-23 ENCOUNTER — Other Ambulatory Visit (INDEPENDENT_AMBULATORY_CARE_PROVIDER_SITE_OTHER): Payer: Medicare Other

## 2011-09-23 DIAGNOSIS — I2589 Other forms of chronic ischemic heart disease: Secondary | ICD-10-CM

## 2011-09-23 DIAGNOSIS — I5022 Chronic systolic (congestive) heart failure: Secondary | ICD-10-CM

## 2011-09-23 DIAGNOSIS — I255 Ischemic cardiomyopathy: Secondary | ICD-10-CM

## 2011-09-23 LAB — BASIC METABOLIC PANEL
BUN: 16 mg/dL (ref 6–23)
CO2: 30 mEq/L (ref 19–32)
Calcium: 9.4 mg/dL (ref 8.4–10.5)
Chloride: 103 mEq/L (ref 96–112)
Creatinine, Ser: 1.4 mg/dL — ABNORMAL HIGH (ref 0.4–1.2)
GFR: 50.26 mL/min — ABNORMAL LOW (ref >60.00)
Glucose, Bld: 135 mg/dL — ABNORMAL HIGH (ref 70–99)
Potassium: 5.2 mEq/L — ABNORMAL HIGH (ref 3.5–5.1)
Sodium: 143 mEq/L (ref 135–145)

## 2011-09-30 ENCOUNTER — Telehealth: Payer: Self-pay | Admitting: Cardiology

## 2011-09-30 NOTE — Telephone Encounter (Signed)
Pt calling re status of fax that was sent a week ago [phone]

## 2011-09-30 NOTE — Telephone Encounter (Signed)
Left message for pt to call back.  I am not aware of any faxed information.  It aware [facility] faxed lab work orders and that she recently saw Norma Fredrickson.

## 2011-09-30 NOTE — Telephone Encounter (Signed)
Per pt - paperwork was to have been faxed here to be completed for a cardiopulmonary program she is to attend.  Pt aware I have not received paperwork but will continue to watch for it.  She will have it refaxed.

## 2011-10-01 IMAGING — CR DG CHEST 2V
2 series · 2 of 2 positions shown · non-contrast
Comparison: Chest 03/23/2010.

CLINICAL DATA: Shortness of breath and chest pain.

CHEST - 2 VIEW

[w chest lat *]
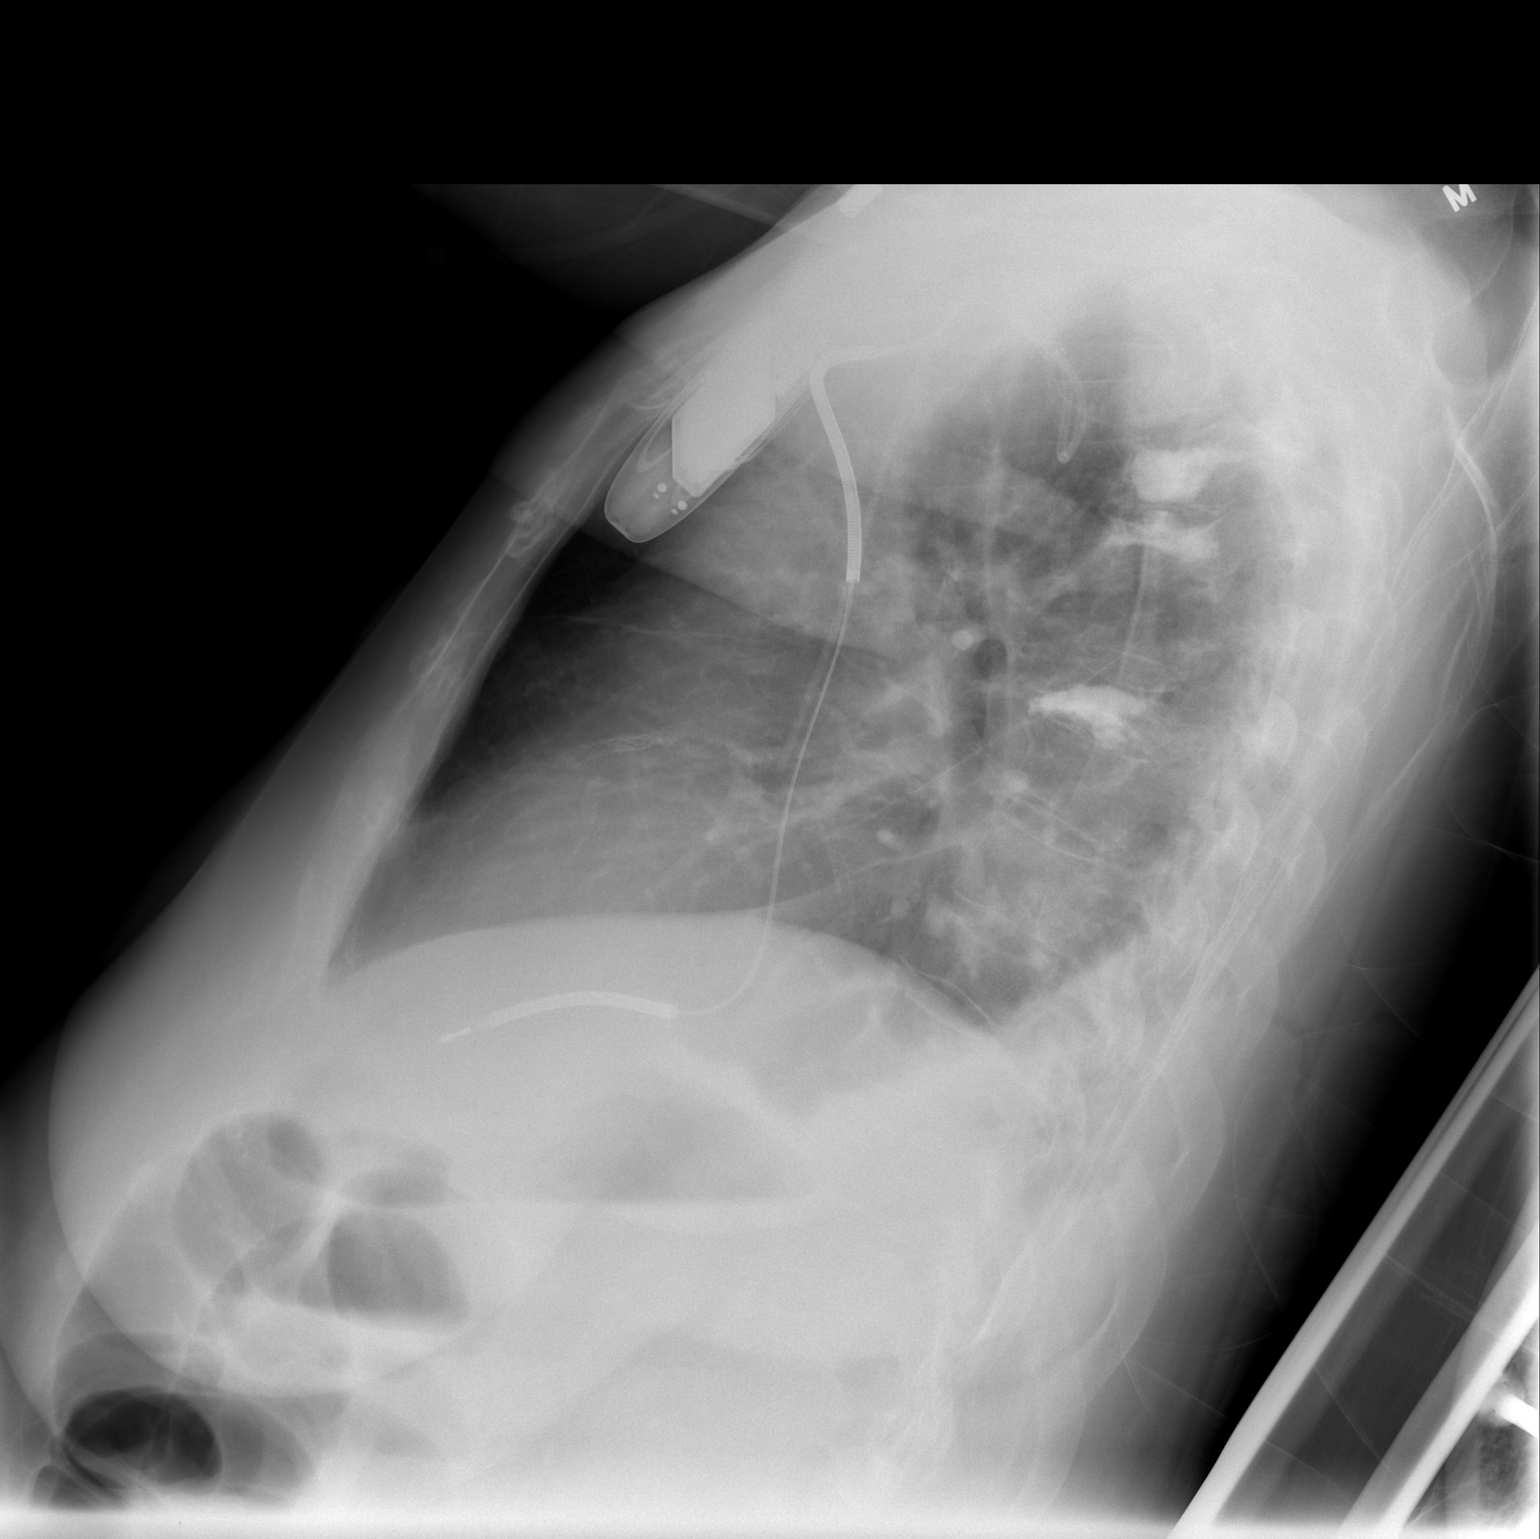

[w chest pa]
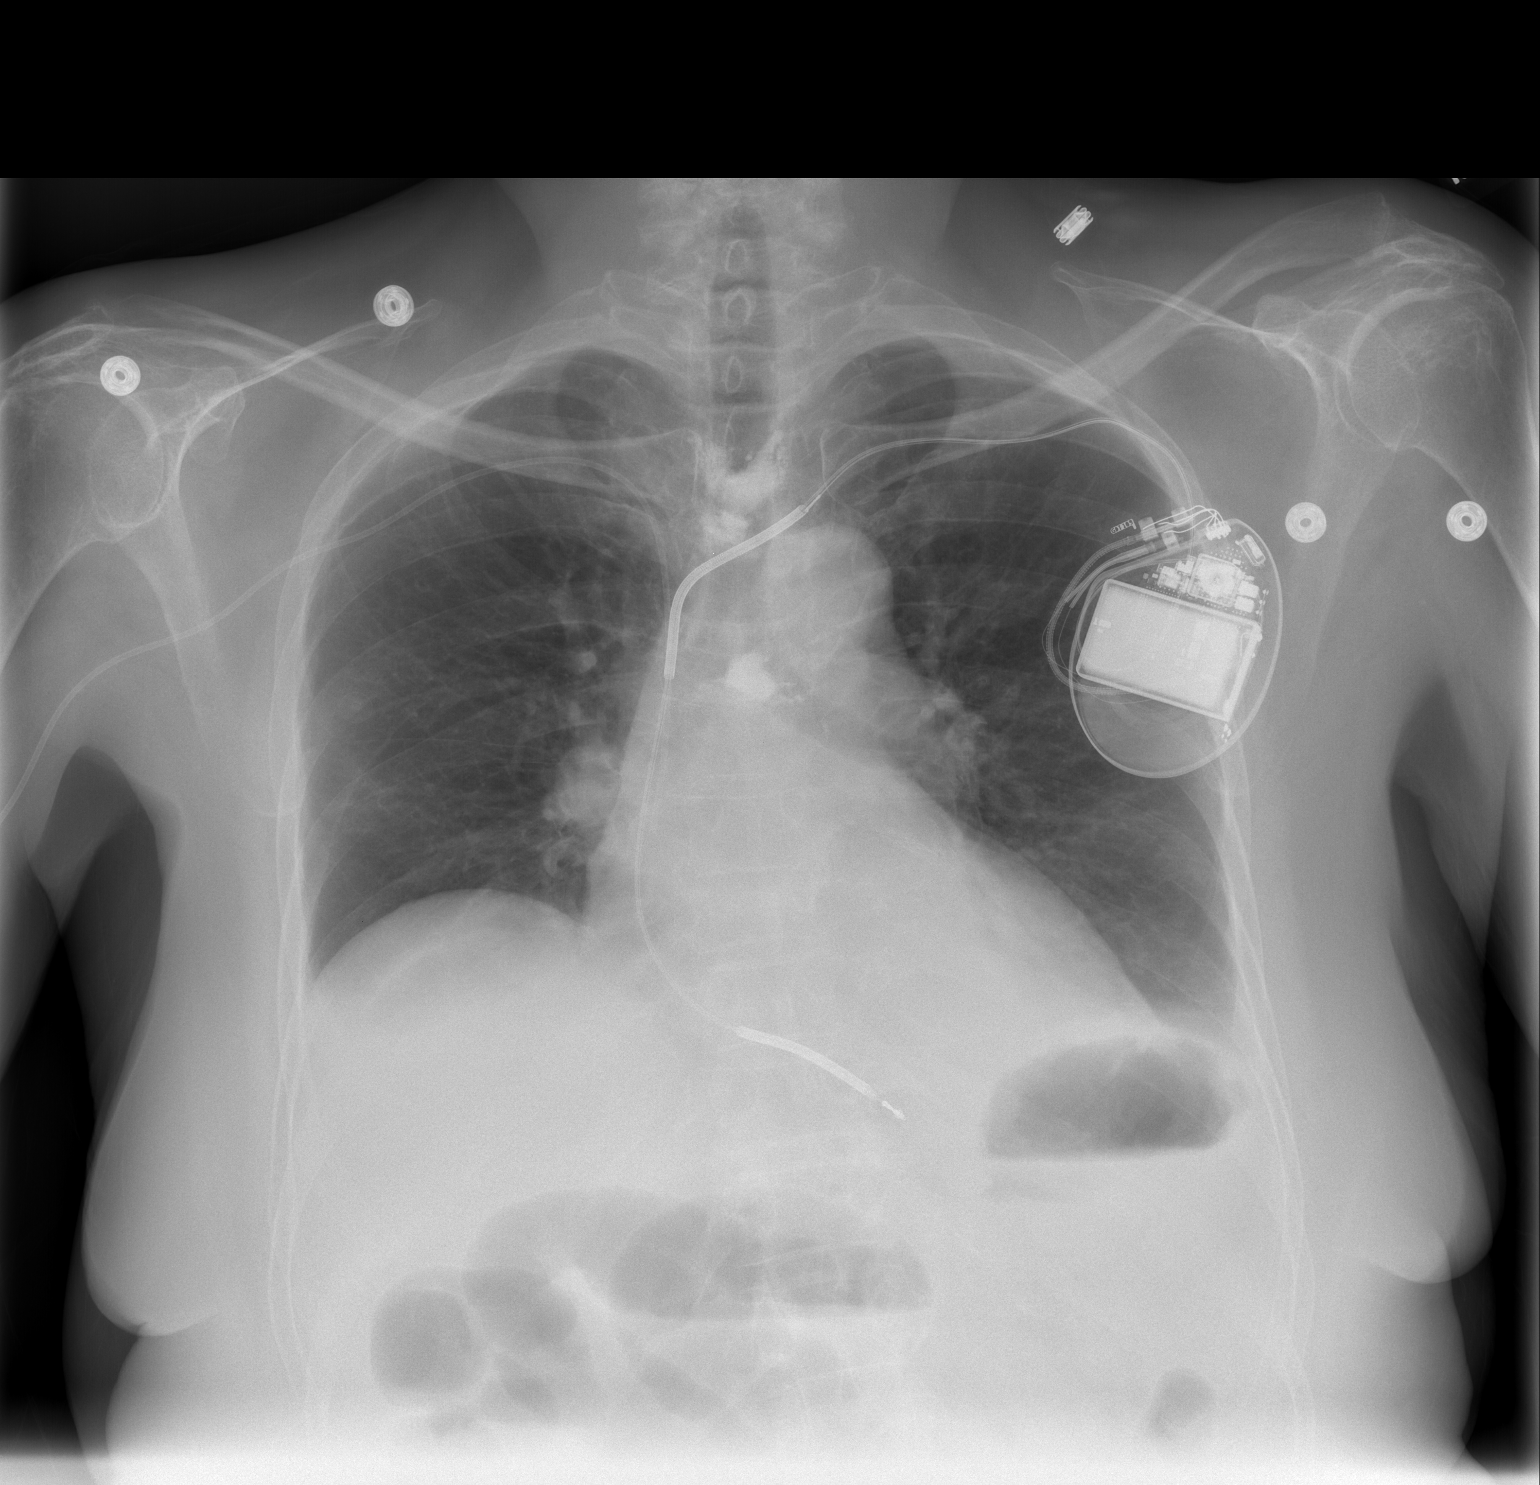

[2 of 2 positions shown; findings below may reference images not displayed]

FINDINGS: The lungs are clear.  The patient has very small
bilateral pleural effusions.  Heart size upper normal.  Right PICC
and AICD again noted.  Postoperative change of thoracic
vertebroplasty also again seen.
IMPRESSION: 1.  Small bilateral pleural effusions.
2.  Cardiomegaly without edema.

## 2011-10-03 ENCOUNTER — Ambulatory Visit (INDEPENDENT_AMBULATORY_CARE_PROVIDER_SITE_OTHER): Payer: Medicare Other | Admitting: Nurse Practitioner

## 2011-10-03 ENCOUNTER — Ambulatory Visit (INDEPENDENT_AMBULATORY_CARE_PROVIDER_SITE_OTHER): Payer: Medicare Other | Admitting: Internal Medicine

## 2011-10-03 ENCOUNTER — Encounter: Payer: Self-pay | Admitting: Nurse Practitioner

## 2011-10-03 ENCOUNTER — Telehealth: Payer: Self-pay | Admitting: Cardiology

## 2011-10-03 VITALS — BP 122/70 | HR 68 | Ht 66.0 in | Wt 142.8 lb

## 2011-10-03 DIAGNOSIS — I2589 Other forms of chronic ischemic heart disease: Secondary | ICD-10-CM

## 2011-10-03 DIAGNOSIS — E875 Hyperkalemia: Secondary | ICD-10-CM

## 2011-10-03 DIAGNOSIS — I255 Ischemic cardiomyopathy: Secondary | ICD-10-CM

## 2011-10-03 LAB — BASIC METABOLIC PANEL
BUN: 25 mg/dL — ABNORMAL HIGH (ref 6–23)
CO2: 26 mEq/L (ref 19–32)
Calcium: 9.3 mg/dL (ref 8.4–10.5)
Chloride: 100 mEq/L (ref 96–112)
Creatinine, Ser: 1.2 mg/dL (ref 0.4–1.2)
GFR: 59.13 mL/min — ABNORMAL LOW (ref >60.00)
Glucose, Bld: 121 mg/dL — ABNORMAL HIGH (ref 70–99)
Potassium: 3.8 mEq/L (ref 3.5–5.1)
Sodium: 140 mEq/L (ref 135–145)

## 2011-10-03 LAB — POCT INR: INR: 2.2

## 2011-10-03 MED ORDER — LISINOPRIL 5 MG PO TABS: 5.0000 mg | ORAL_TABLET | Freq: Two times a day (BID) | ORAL | Status: DC

## 2011-10-03 MED ORDER — LISINOPRIL 5 MG PO TABS: 5.0000 mg | ORAL_TABLET | Freq: Every day | ORAL | Status: DC

## 2011-10-03 NOTE — Patient Instructions (Addendum)
Stop the potassium  Let's try to increase the Lisinopril to 5 mg two times a day  We will see you back in 3 weeks.  We will check labs on your return visit.  Call the [facility] office at [phone] if you have any questions, problems or concerns.

## 2011-10-03 NOTE — Telephone Encounter (Signed)
Please return call to patient at [phone] regarding BMET

## 2011-10-03 NOTE — Assessment & Plan Note (Signed)
EF is 25%. She looks fairly compensated today. She is agreeable to titrating her medicines with hopes of helping manage her heart failure symptoms. We will increase the Lisinopril to 5 mg BID. Check BMET today. I stopped her potassium. Will need to follow her renal function closely. I will see her back in 3 weeks with a BMET on return. Salt restriction is encouraged. No other changes today but could consider increasing her Coreg and/or adding Aldactone in the future. Patient is agreeable to this plan and will call if any problems develop in the interim.

## 2011-10-03 NOTE — Progress Notes (Signed)
[name] Sample Date of Birth: April 21, 1950 Medical Record #[id number]  History of Present Illness: [name] is seen today for a follow up visit. It is a 2 week check. She is seen for Dr. Antoine Poche. She has multiple medical issues which include an ischemic CM with an EF of 25 to 30%, remote MI with PCI, functioning ICD, HTN, HLD, chronic anticoagulation for a mural thrombus, prior stroke, multiple back surgeries, GERD and diverticulitis with prior GI bleed. She has a chronically abnormal EKG with ST elevation as a baseline. Her last echo was in November of 2012 and her EF was 25%.   She comes in today. She is here alone. She had had a recent admission with heart failure. No trigger noted and she usually has abrupt onset of pulmonary edema.  She is only on low dose ACE and is on Coreg as well. I suspect she has increased salt use and perhaps alcohol use. She is doing ok. Continues to have home health involved in her care. Weight is staying around 140 to 142. If she goes up 2 pounds, she takes an extra Lasix. This makes her weak and tired. No chest pain. No swelling/PND/orthopnea/cough. She remains primarily limited from her back pain. Potassium was cut back after her last visit due to a reading of 5.2. She is on low dose ACE. Our plan was to try and start titrating her medicines so she would be on a better heart failure regime.   Current Outpatient Prescriptions on File Prior to Visit  Medication Sig Dispense Refill  . amiodarone (PACERONE) 200 MG tablet Take 200 mg by mouth daily.      [name] aspirin 81 MG EC tablet Take 81 mg by mouth daily.        . busPIRone (BUSPAR) 15 MG tablet Take 15 mg by mouth daily as needed. For mood stabilization.      . calcium-vitamin D (OSCAL WITH D) 500-200 MG-UNIT per tablet Take 1 tablet by mouth 2 (two) times daily with a meal.  60 tablet  1  . carvedilol (COREG) 12.5 MG tablet Take 1 tablet (12.5 mg total) by mouth 2 (two) times daily with a meal.  60 tablet  6  .  cyclobenzaprine (FLEXERIL) 5 MG tablet Take 5 mg by mouth 2 (two) times daily as needed. For muscle spasms.      [name] ezetimibe (ZETIA) 10 MG tablet Take 10 mg by mouth daily.      . fexofenadine (ALLEGRA) 180 MG tablet Take 180 mg by mouth daily.       . fish oil-omega-3 fatty acids 1000 MG capsule Take 1,000 mg by mouth daily.        . folic acid-pyridoxine-cyancobalamin (FOLTX) 2.5-25-2 MG TABS Take 1 tablet by mouth daily.        . furosemide (LASIX) 20 MG tablet Take 1 tablet (20 mg total) by mouth daily.  30 tablet  1  . HYDROcodone-acetaminophen (NORCO) 5-325 MG per tablet Take 1 tablet by mouth every 4 (four) hours as needed. For pain       . levothyroxine (SYNTHROID, LEVOTHROID) 200 MCG tablet Take 1 tablet (200 mcg total) by mouth daily.  30 tablet  6  . methocarbamol (ROBAXIN) 500 MG tablet Take 500 mg by mouth 4 (four) times daily as needed. For spasms       . nitroGLYCERIN (NITROSTAT) 0.4 MG SL tablet Place 0.4 mg under the tongue every 5 (five) minutes as needed. For chest pain       .  pantoprazole (PROTONIX) 40 MG tablet Take 40 mg by mouth 2 (two) times daily.       [name] senna (SENOKOT) 8.6 MG TABS Take 2 tablets by mouth at bedtime.      [name] warfarin (COUMADIN) 3 MG tablet Take 3 mg by mouth every evening.       . fluticasone (FLONASE) 50 MCG/ACT nasal spray Place 2 sprays into the nose daily as needed. For allergies         Allergies  Allergen Reactions  . Lansoprazole Nausea Only  . Penicillins Swelling    Throat swells  . Statins Other (See Comments)    cramps    Past Medical History  Diagnosis Date  . Spinal stenosis   . Numbness of foot     left foot  . Diverticulitis     Status post partial colectomy 1/12 with reversal in July 2012  . Colitis, ischemic   . DJD (degenerative joint disease)     History of multiple surgeries to the back, shoulder and knee  . Ischemic cardiomyopathy     Echo 06/16/10: EF 25-30%, anteroseptal and apical hypokinesis, moderate AI, mild MR    . Chronic systolic heart failure   . CAD (coronary artery disease)     a.  Ant MI 8/03 with stenting of the LAD;  b. staged PCI with Cypher DES to OM1 8/03;  c. s/p Cypher DES to LAD 7/04;  d.  multiple cardiac caths in past (chronically abnl ECG);    e. cardiac catheterization 3/12: LAD stent patent, distal LAD 40-50%, small ostial D1 80%, ostial D2 60%, OM-1 stent patent (20-30%), proximal RCA 50%, proximal to mid RCA 40-50%; f. cath 02/17/2011 - nonobs  . LV (left ventricular) mural thrombus     Chronic Coumadin therapy  . CKD (chronic kidney disease), stage III     creatinine:  1.3 in 4/12;    . Tobacco abuse     history  . GERD (gastroesophageal reflux disease)   . Hypertension   . Lower GI bleed     August 2011  . Hypothyroidism   . Aortic insufficiency   . Pseudoaneurysm     History of, right forearm  . Hyperlipidemia   . OA (osteoarthritis)   . Abnormal EKG     Chronically abnormal EKG.   . Spinal stenosis   . Numbness of foot     Left foot  . Compression fracture 07/04/11    L2  . Anterior myocardial infarction 10/2001  . SVT (supraventricular tachycardia)     ? h/o AFib;  patient on long term amiodarone therapy  . PAF (paroxysmal atrial fibrillation)   . Anatomical narrow angle of right eye   . Heart murmur     "age 13-19"  . Shortness of breath     "at times; related to CHF"  . Shortness of breath on exertion   . 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 Graciela Husbands.   [name] AICD (automatic cardioverter/defibrillator) present 10/2004    explant; implant  . Blood transfusion   . Anemia   . H/O hiatal hernia   . Stroke     History of TIA and possible CVA; hospitalized 2004; on coumadin  . Systolic heart failure     Past Surgical History  Procedure Date  . Back surgery   . Coronary angioplasty with stent placement 10/2001; 09/2002  . Cardiac defibrillator placement 07/2003; 10/2004  . Lumbar laminectomy/decompression microdiscectomy  10/2001  L5-S1/E-chart  . Knee arthroscopy     left  . Thyroidectomy 1970's  . Total knee arthroplasty 11/2003    left  . X-stop implantation 12/2004    L3-4; L4-5  . Fixation kyphoplasty thoracic spine 07/2007    T3, 4, 6 compression fractures  . Shoulder open rotator cuff repair 04/2008    right  . Lumbar laminectomy/decompression microdiscectomy 01/2010  . Colectomy 03/2010    sigmoid left  . Colostomy 03/2010    transverse  . Peripherally inserted central catheter insertion 03/2010    removed upon discharge  . Colostomy closure 12/2010    reversal  . Tonsillectomy and adenoidectomy 1969  . Appendectomy 03/2010  . Cataract extraction     right    History  Smoking status  . Former Smoker -- 0.1 packs/day for 33 years  . Types: Cigarettes  . Quit date: 04/21/2001  Smokeless tobacco  . Never Used  Comment: "smoked ~ 1 pack/month"    History  Alcohol Use  . Yes    07/05/11 "occasionally; last time was glass of wine last week"    Family History  Problem Relation Age of Onset  . Diabetes Mother   . Diabetes Brother   . Arthritis      family history  . Prostate cancer      Family History    Review of Systems: The review of systems is per the HPI.  All other systems were reviewed and are negative.  Physical Exam: BP 122/70  Pulse 68  Ht 5\' 6"  (1.676 m)  Wt 142 lb 12.8 oz (64.774 kg)  BMI 23.05 kg/m2 Patient is pleasant and in no acute distress. Skin is warm and dry. Color is normal.  HEENT is unremarkable. Normocephalic/atraumatic. PERRL. Sclera are nonicteric. Neck is supple. No masses. No JVD. Lungs are clear. Cardiac exam shows a regular rate and rhythm. Abdomen is soft. Extremities are without edema. Gait and ROM are intact. No gross neurologic deficits noted.   LABORATORY DATA:   Lab Results  Component Value Date   WBC 3.1* 09/14/2011   HGB 13.0 09/14/2011   HCT 37.9 09/14/2011   PLT 174 09/14/2011   GLUCOSE 135* 09/23/2011   CHOL 209* 02/18/2011   TRIG  56 02/18/2011   HDL 104 02/18/2011   LDLCALC 94 02/18/2011   ALT 16 07/11/2011   AST 27 07/11/2011   NA 143 09/23/2011   K 5.2* 09/23/2011   CL 103 09/23/2011   CREATININE 1.4* 09/23/2011   BUN 16 09/23/2011   CO2 30 09/23/2011   TSH 6.745* 09/14/2011   INR 2.1 09/23/2011   HGBA1C 5.1 02/17/2011     Assessment / Plan:

## 2011-10-03 NOTE — Telephone Encounter (Signed)
New msg [facility] called to say he could not get blood work that was ordered today. Please call back

## 2011-10-03 NOTE — Telephone Encounter (Signed)
Patient actually scheduled to see Lawson Fiscal NP today so will get labs at office

## 2011-10-04 ENCOUNTER — Other Ambulatory Visit: Payer: Self-pay | Admitting: Nurse Practitioner

## 2011-10-04 DIAGNOSIS — I10 Essential (primary) hypertension: Secondary | ICD-10-CM

## 2011-10-11 ENCOUNTER — Other Ambulatory Visit: Payer: Medicare Other

## 2011-10-16 DIAGNOSIS — I5022 Chronic systolic (congestive) heart failure: Secondary | ICD-10-CM

## 2011-10-17 ENCOUNTER — Ambulatory Visit: Payer: Self-pay | Admitting: Cardiovascular Disease

## 2011-10-17 DIAGNOSIS — I255 Ischemic cardiomyopathy: Secondary | ICD-10-CM

## 2011-10-20 ENCOUNTER — Telehealth: Payer: Self-pay | Admitting: Internal Medicine

## 2011-10-20 NOTE — Telephone Encounter (Signed)
Please advise in Dr Jones' absence, thanks 

## 2011-10-20 NOTE — Telephone Encounter (Signed)
Pt. Is asking Dr. Yetta Barre to write a RX for:  Evista; 60mg  once daily.  This was previously prescribed by her rehab Dr. Hildred Alamin, and pt. No longer sees him.  She would like a 90 day supply called to [facility] on [address].  [phone].  Please notify pt. When this is completed.

## 2011-10-20 NOTE — Telephone Encounter (Signed)
Ok to prescribe Evista 60 mg q d, #90 3 refills

## 2011-10-21 MED ORDER — RALOXIFENE HCL 60 MG PO TABS: 60.0000 mg | ORAL_TABLET | Freq: Every day | ORAL | Status: DC

## 2011-10-24 ENCOUNTER — Encounter: Payer: Self-pay | Admitting: Nurse Practitioner

## 2011-10-24 ENCOUNTER — Ambulatory Visit: Payer: Self-pay | Admitting: Cardiology

## 2011-10-24 ENCOUNTER — Ambulatory Visit (INDEPENDENT_AMBULATORY_CARE_PROVIDER_SITE_OTHER): Payer: Medicare Other | Admitting: Nurse Practitioner

## 2011-10-24 VITALS — BP 118/66 | HR 64 | Ht 66.0 in | Wt 147.1 lb

## 2011-10-24 DIAGNOSIS — I5020 Unspecified systolic (congestive) heart failure: Secondary | ICD-10-CM

## 2011-10-24 DIAGNOSIS — R0609 Other forms of dyspnea: Secondary | ICD-10-CM

## 2011-10-24 DIAGNOSIS — R0600 Dyspnea, unspecified: Secondary | ICD-10-CM

## 2011-10-24 DIAGNOSIS — I255 Ischemic cardiomyopathy: Secondary | ICD-10-CM

## 2011-10-24 DIAGNOSIS — R0989 Other specified symptoms and signs involving the circulatory and respiratory systems: Secondary | ICD-10-CM

## 2011-10-24 DIAGNOSIS — I2589 Other forms of chronic ischemic heart disease: Secondary | ICD-10-CM

## 2011-10-24 LAB — BASIC METABOLIC PANEL
BUN: 22 mg/dL (ref 6–23)
CO2: 26 mEq/L (ref 19–32)
Calcium: 8.7 mg/dL (ref 8.4–10.5)
Chloride: 103 mEq/L (ref 96–112)
Creatinine, Ser: 1.2 mg/dL (ref 0.4–1.2)
GFR: 60.28 mL/min (ref >60.00)
Glucose, Bld: 105 mg/dL — ABNORMAL HIGH (ref 70–99)
Potassium: 3.7 mEq/L (ref 3.5–5.1)
Sodium: 138 mEq/L (ref 135–145)

## 2011-10-24 LAB — BRAIN NATRIURETIC PEPTIDE: Pro B Natriuretic peptide (BNP): 159 pg/mL — ABNORMAL HIGH (ref 0.0–100.0)

## 2011-10-24 NOTE — Assessment & Plan Note (Signed)
EF is 25%. She misunderstood my instructions at her last visit. I had only increased her ACE and not the Coreg. She does not really tolerate an increase her medicines. I have left her on her current regimen for now. May try again at her next visit. I will check a BNP and BMET today. Her weight is up more here today but she says it is stable at home. Will see what her labs show. She knows to use extra Lasix prn. Patient is agreeable to this plan and will call if any problems develop in the interim.

## 2011-10-24 NOTE — Patient Instructions (Addendum)
Let's stay on your current medicines for now  Weigh each day.   Avoid salt  Dr. Antoine Poche  will see you in a month  Call the [facility] office at [phone] if you have any questions, problems or concerns.

## 2011-10-24 NOTE — Progress Notes (Signed)
[name] Date of Birth: 18-Oct-1950 Medical Record #[id number]  History of Present Illness: [name] is seen today for a follow up visit. It is a 3 week check. She is seen for Dr. Antoine Poche. She has multiple medical issues which include an ischemic CM with an EF of 25 to 30%, remote MI with PCI, functioning ICD, HTN, HLD, chronic anticoagulation for a mural thrombus, prior stroke, multiple back surgeries, GERD and diverticulitis with prior GI bleed. She has a chronically abnormal EKG with ST elevation as a baseline. Last echo was in November of 2012 and EF was 25%. At her last visit, we increased her Lisinipril. Our goal was to try and get her on a better heart failure regimen with less exacerbations.   She comes in today. She is here alone. She seems like she got my instructions confused at her last visit. She increased both her Coreg and Lisinopril. Two days later, felt ill and achy all over. She went back to her old regimen. All I had increased was her Lisinopril. She has used some extra lasix once since she was here. She says she is eating more. Weight is up some here today. No chest pain. Continues to work with physical therapy. She denies salt but this has always been difficult to discern.   Current Outpatient Prescriptions on File Prior to Visit  Medication Sig Dispense Refill  . amiodarone (PACERONE) 200 MG tablet Take 200 mg by mouth daily.      [name] aspirin 81 MG EC tablet Take 81 mg by mouth daily.        . busPIRone (BUSPAR) 15 MG tablet Take 15 mg by mouth daily as needed. For mood stabilization.      . calcium-vitamin D (OSCAL WITH D) 500-200 MG-UNIT per tablet Take 1 tablet by mouth 2 (two) times daily with a meal.  60 tablet  1  . carvedilol (COREG) 12.5 MG tablet Take 1 tablet (12.5 mg total) by mouth 2 (two) times daily with a meal.  60 tablet  6  . cyclobenzaprine (FLEXERIL) 5 MG tablet Take 5 mg by mouth 2 (two) times daily as needed. For muscle spasms.      [name] ezetimibe (ZETIA)  10 MG tablet Take 10 mg by mouth daily.      . fexofenadine (ALLEGRA) 180 MG tablet Take 180 mg by mouth daily.       . fish oil-omega-3 fatty acids 1000 MG capsule Take 1,000 mg by mouth daily.        . fluticasone (FLONASE) 50 MCG/ACT nasal spray Place 2 sprays into the nose daily as needed. For allergies       . folic acid-pyridoxine-cyancobalamin (FOLTX) 2.5-25-2 MG TABS Take 1 tablet by mouth daily.        . furosemide (LASIX) 20 MG tablet Take 1 tablet (20 mg total) by mouth daily.  30 tablet  1  . HYDROcodone-acetaminophen (NORCO) 5-325 MG per tablet Take 1 tablet by mouth every 4 (four) hours as needed. For pain       . levothyroxine (SYNTHROID, LEVOTHROID) 200 MCG tablet Take 1 tablet (200 mcg total) by mouth daily.  30 tablet  6  . methocarbamol (ROBAXIN) 500 MG tablet Take 500 mg by mouth 4 (four) times daily as needed. For spasms       . nitroGLYCERIN (NITROSTAT) 0.4 MG SL tablet Place 0.4 mg under the tongue every 5 (five) minutes as needed. For chest pain       .  pantoprazole (PROTONIX) 40 MG tablet Take 40 mg by mouth 2 (two) times daily.       . raloxifene (EVISTA) 60 MG tablet Take 1 tablet (60 mg total) by mouth daily.  90 tablet  3  . senna (SENOKOT) 8.6 MG TABS Take 2 tablets by mouth at bedtime.      [name] warfarin (COUMADIN) 3 MG tablet Take 3 mg by mouth every evening.         Allergies  Allergen Reactions  . Lansoprazole Nausea Only  . Penicillins Swelling    Throat swells  . Statins Other (See Comments)    cramps    Past Medical History  Diagnosis Date  . Spinal stenosis   . Numbness of foot     left foot  . Diverticulitis     Status post partial colectomy 1/12 with reversal in July 2012  . Colitis, ischemic   . DJD (degenerative joint disease)     History of multiple surgeries to the back, shoulder and knee  . Ischemic cardiomyopathy     Echo 06/16/10: EF 25-30%, anteroseptal and apical hypokinesis, moderate AI, mild MR  . Chronic systolic heart failure   .  CAD (coronary artery disease)     a.  Ant MI 8/03 with stenting of the LAD;  b. staged PCI with Cypher DES to OM1 8/03;  c. s/p Cypher DES to LAD 7/04;  d.  multiple cardiac caths in past (chronically abnl ECG);    e. cardiac catheterization 3/12: LAD stent patent, distal LAD 40-50%, small ostial D1 80%, ostial D2 60%, OM-1 stent patent (20-30%), proximal RCA 50%, proximal to mid RCA 40-50%; f. cath 02/17/2011 - nonobs  . LV (left ventricular) mural thrombus     Chronic Coumadin therapy  . CKD (chronic kidney disease), stage III     creatinine:  1.3 in 4/12;    . Tobacco abuse     history  . GERD (gastroesophageal reflux disease)   . Hypertension   . Lower GI bleed     August 2011  . Hypothyroidism   . Aortic insufficiency   . Pseudoaneurysm     History of, right forearm  . Hyperlipidemia   . OA (osteoarthritis)   . Abnormal EKG     Chronically abnormal EKG.   . Spinal stenosis   . Numbness of foot     Left foot  . Compression fracture 07/04/11    L2  . Anterior myocardial infarction 10/2001  . SVT (supraventricular tachycardia)     ? h/o AFib;  patient on long term amiodarone therapy  . PAF (paroxysmal atrial fibrillation)   . Anatomical narrow angle of right eye   . Heart murmur     "age [age]"  . Shortness of breath     "at times; related to CHF"  . Shortness of breath on exertion   . 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 Graciela Husbands.   [name] AICD (automatic cardioverter/defibrillator) present 10/2004    explant; implant  . Blood transfusion   . Anemia   . H/O hiatal hernia   . Stroke     History of TIA and possible CVA; hospitalized 2004; on coumadin  . Systolic heart failure     Past Surgical History  Procedure Date  . Back surgery   . Coronary angioplasty with stent placement 10/2001; 09/2002  . Cardiac defibrillator placement 07/2003; 10/2004  . Lumbar laminectomy/decompression microdiscectomy 10/2001    L5-S1/E-chart  .  Knee arthroscopy      left  . Thyroidectomy 1970's  . Total knee arthroplasty 11/2003    left  . X-stop implantation 12/2004    L3-4; L4-5  . Fixation kyphoplasty thoracic spine 07/2007    T3, 4, 6 compression fractures  . Shoulder open rotator cuff repair 04/2008    right  . Lumbar laminectomy/decompression microdiscectomy 01/2010  . Colectomy 03/2010    sigmoid left  . Colostomy 03/2010    transverse  . Peripherally inserted central catheter insertion 03/2010    removed upon discharge  . Colostomy closure 12/2010    reversal  . Tonsillectomy and adenoidectomy 1969  . Appendectomy 03/2010  . Cataract extraction     right    History  Smoking status  . Former Smoker -- 0.1 packs/day for 33 years  . Types: Cigarettes  . Quit date: 04/21/2001  Smokeless tobacco  . Never Used  Comment: "smoked ~ 1 pack/month"    History  Alcohol Use  . Yes    07/05/11 "occasionally; last time was glass of wine last week"    Family History  Problem Relation Age of Onset  . Diabetes Mother   . Diabetes Brother   . Arthritis      family history  . Prostate cancer      Family History    Review of Systems: The review of systems is per the HPI.  All other systems were reviewed and are negative.  Physical Exam: BP 118/66  Pulse 64  Ht 5\' 6"  (1.676 m)  Wt 147 lb 1.9 oz (66.733 kg)  BMI 23.75 kg/m2 Patient is very pleasant and in no acute distress. Skin is warm and dry. Color is normal.  HEENT is unremarkable. Normocephalic/atraumatic. PERRL. Sclera are nonicteric. Neck is supple. No masses. No JVD. Lungs are clear. Cardiac exam shows a regular rate and rhythm. Abdomen is soft. Extremities are without edema. Gait and ROM are intact. No gross neurologic deficits noted.   LABORATORY DATA: BMET and BNP are pending for today.   Assessment / Plan:

## 2011-10-30 MED ORDER — ONDANSETRON HCL 4 MG/2ML IJ SOLN
INTRAMUSCULAR | Status: AC
  Filled 2011-10-30: qty 2

## 2011-11-03 LAB — POCT INR: INR: 1.8

## 2011-11-04 ENCOUNTER — Ambulatory Visit: Payer: Self-pay | Admitting: Cardiovascular Disease

## 2011-11-04 DIAGNOSIS — I255 Ischemic cardiomyopathy: Secondary | ICD-10-CM

## 2011-11-14 ENCOUNTER — Ambulatory Visit: Payer: Self-pay | Admitting: Internal Medicine

## 2011-11-14 DIAGNOSIS — I255 Ischemic cardiomyopathy: Secondary | ICD-10-CM

## 2011-11-23 ENCOUNTER — Ambulatory Visit: Payer: Medicare Other | Admitting: Cardiology

## 2011-12-01 DIAGNOSIS — N183 Chronic kidney disease, stage 3 unspecified: Secondary | ICD-10-CM

## 2011-12-01 DIAGNOSIS — I129 Hypertensive chronic kidney disease with stage 1 through stage 4 chronic kidney disease, or unspecified chronic kidney disease: Secondary | ICD-10-CM

## 2011-12-01 DIAGNOSIS — I2510 Atherosclerotic heart disease of native coronary artery without angina pectoris: Secondary | ICD-10-CM

## 2011-12-01 DIAGNOSIS — I5023 Acute on chronic systolic (congestive) heart failure: Secondary | ICD-10-CM

## 2011-12-15 ENCOUNTER — Ambulatory Visit (INDEPENDENT_AMBULATORY_CARE_PROVIDER_SITE_OTHER): Payer: Medicare Other | Admitting: Internal Medicine

## 2011-12-15 ENCOUNTER — Other Ambulatory Visit (INDEPENDENT_AMBULATORY_CARE_PROVIDER_SITE_OTHER): Payer: Medicare Other

## 2011-12-15 ENCOUNTER — Encounter: Payer: Self-pay | Admitting: Internal Medicine

## 2011-12-15 VITALS — BP 112/70 | HR 60 | Temp 98.1°F | Resp 16 | Wt 147.0 lb

## 2011-12-15 DIAGNOSIS — S32009A Unspecified fracture of unspecified lumbar vertebra, initial encounter for closed fracture: Secondary | ICD-10-CM

## 2011-12-15 DIAGNOSIS — E039 Hypothyroidism, unspecified: Secondary | ICD-10-CM

## 2011-12-15 DIAGNOSIS — Z0000 Encounter for general adult medical examination without abnormal findings: Secondary | ICD-10-CM

## 2011-12-15 DIAGNOSIS — Z23 Encounter for immunization: Secondary | ICD-10-CM

## 2011-12-15 DIAGNOSIS — I10 Essential (primary) hypertension: Secondary | ICD-10-CM

## 2011-12-15 DIAGNOSIS — M818 Other osteoporosis without current pathological fracture: Secondary | ICD-10-CM | POA: Insufficient documentation

## 2011-12-15 DIAGNOSIS — E785 Hyperlipidemia, unspecified: Secondary | ICD-10-CM

## 2011-12-15 DIAGNOSIS — S32020A Wedge compression fracture of second lumbar vertebra, initial encounter for closed fracture: Secondary | ICD-10-CM

## 2011-12-15 DIAGNOSIS — Z1231 Encounter for screening mammogram for malignant neoplasm of breast: Secondary | ICD-10-CM | POA: Insufficient documentation

## 2011-12-15 LAB — COMPREHENSIVE METABOLIC PANEL
Alkaline Phosphatase: 64 U/L (ref 39–117)
BUN: 21 mg/dL (ref 6–23)
Glucose, Bld: 105 mg/dL — ABNORMAL HIGH (ref 70–99)
Sodium: 135 mEq/L (ref 135–145)
Total Bilirubin: 1 mg/dL (ref 0.3–1.2)

## 2011-12-15 LAB — CBC WITH DIFFERENTIAL/PLATELET
HCT: 42.1 % (ref 36.0–46.0)
MCV: 99.7 fl (ref 78.0–100.0)
RBC: 4.22 Mil/uL (ref 3.87–5.11)
WBC: 4.1 10*3/uL — ABNORMAL LOW (ref 4.5–10.5)

## 2011-12-15 LAB — LDL CHOLESTEROL, DIRECT: Direct LDL: 126.3 mg/dL

## 2011-12-15 LAB — TSH: TSH: 1.21 u[IU]/mL (ref 0.35–5.50)

## 2011-12-15 LAB — LIPID PANEL
Total CHOL/HDL Ratio: 3
VLDL: 20 mg/dL (ref 0.0–40.0)

## 2011-12-15 NOTE — Progress Notes (Signed)
Subjective:    Patient ID: [name], female    DOB: 27-Dec-1950, 61 y.o.   MRN: [id number]  Thyroid Problem Presents for follow-up visit. Symptoms include constipation and fatigue. Patient reports no anxiety, cold intolerance, depressed mood, diaphoresis, diarrhea, dry skin, hair loss, heat intolerance, hoarse voice, leg swelling, menstrual problem, nail problem, palpitations, tremors, visual change, weight gain or weight loss. The symptoms have been stable.      Review of Systems  Constitutional: Positive for fatigue. Negative for fever, chills, weight loss, weight gain, diaphoresis, activity change, appetite change and unexpected weight change.  HENT: Negative.  Negative for hoarse voice.   Eyes: Negative.   Respiratory: Negative for cough, chest tightness, shortness of breath, wheezing and stridor.   Cardiovascular: Negative for chest pain, palpitations and leg swelling.  Gastrointestinal: Positive for constipation. Negative for nausea, vomiting, abdominal pain, diarrhea, blood in stool, abdominal distention, anal bleeding and rectal pain.  Genitourinary: Negative.  Negative for menstrual problem.  Musculoskeletal: Negative.   Skin: Negative.   Neurological: Negative.  Negative for tremors.  Hematological: Negative for cold intolerance, heat intolerance and adenopathy. Does not bruise/bleed easily.  Psychiatric/Behavioral: Negative.        Objective:   Physical Exam  Vitals reviewed. Constitutional: She is oriented to person, place, and time. She appears well-developed and well-nourished. No distress.  HENT:  Head: Normocephalic and atraumatic.  Mouth/Throat: Oropharynx is clear and moist. No oropharyngeal exudate.  Eyes: Conjunctivae normal are normal. Right eye exhibits no discharge. Left eye exhibits no discharge. No scleral icterus.  Neck: Normal range of motion. Neck supple. No JVD present. No tracheal deviation present. No thyromegaly present.  Cardiovascular: Normal  rate, regular rhythm, normal heart sounds and intact distal pulses.  Exam reveals no gallop and no friction rub.   No murmur heard. Pulmonary/Chest: Effort normal and breath sounds normal. No accessory muscle usage or stridor. No respiratory distress. She has no wheezes. She has no rales. Chest wall is not dull to percussion. She exhibits no mass, no tenderness and no bony tenderness. Right breast exhibits no inverted nipple, no mass, no nipple discharge, no skin change and no tenderness. Left breast exhibits no inverted nipple, no mass, no nipple discharge, no skin change and no tenderness. Breasts are symmetrical.  Abdominal: Soft. Bowel sounds are normal. She exhibits no distension and no mass. There is no tenderness. There is no rebound and no guarding.  Genitourinary: Rectum normal. Rectal exam shows no external hemorrhoid, no internal hemorrhoid, no fissure, no mass, no tenderness and anal tone normal. Guaiac negative stool.  Musculoskeletal: Normal range of motion. She exhibits no edema and no tenderness.  Lymphadenopathy:    She has no cervical adenopathy.  Neurological: She is oriented to person, place, and time.  Skin: Skin is warm and dry. No rash noted. She is not diaphoretic. No erythema. No pallor.  Psychiatric: She has a normal mood and affect. Her behavior is normal. Judgment and thought content normal.      Lab Results  Component Value Date   WBC 3.1* 09/14/2011   HGB 13.0 09/14/2011   HCT 37.9 09/14/2011   PLT 174 09/14/2011   GLUCOSE 105* 10/24/2011   CHOL 209* 02/18/2011   TRIG 56 02/18/2011   HDL 104 02/18/2011   LDLCALC 94 02/18/2011   ALT 16 07/11/2011   AST 27 07/11/2011   NA 138 10/24/2011   K 3.7 10/24/2011   CL 103 10/24/2011   CREATININE 1.2 10/24/2011   BUN  22 10/24/2011   CO2 26 10/24/2011   TSH 6.745* 09/14/2011   INR 1.8 11/03/2011   HGBA1C 5.1 02/17/2011      Assessment & Plan:

## 2011-12-15 NOTE — Assessment & Plan Note (Addendum)

## 2011-12-15 NOTE — Patient Instructions (Addendum)
Preventive Care for Adults, Female A healthy lifestyle and preventive care can promote health and wellness. Preventive health guidelines for women include the following key practices.  A routine yearly physical is a good way to check with your caregiver about your health and preventive screening. It is a chance to share any concerns and updates on your health, and to receive a thorough exam.   Visit your dentist for a routine exam and preventive care every 6 months. Brush your teeth twice a day and floss once a day. Good oral hygiene prevents tooth decay and gum disease.   The frequency of eye exams is based on your age, health, family medical history, use of contact lenses, and other factors. Follow your caregiver's recommendations for frequency of eye exams.   Eat a healthy diet. Foods like vegetables, fruits, whole grains, low-fat dairy products, and lean protein foods contain the nutrients you need without too many calories. Decrease your intake of foods high in solid fats, added sugars, and salt. Eat the right amount of calories for you.Get information about a proper diet from your caregiver, if necessary.   Regular physical exercise is one of the most important things you can do for your health. Most adults should get at least 150 minutes of moderate-intensity exercise (any activity that increases your heart rate and causes you to sweat) each week. In addition, most adults need muscle-strengthening exercises on 2 or more days a week.   Maintain a healthy weight. The body mass index (BMI) is a screening tool to identify possible weight problems. It provides an estimate of body fat based on height and weight. Your caregiver can help determine your BMI, and can help you achieve or maintain a healthy weight.For adults 20 years and older:   A BMI below 18.5 is considered underweight.   A BMI of 18.5 to 24.9 is normal.   A BMI of 25 to 29.9 is considered overweight.   A BMI of 30 and above is  considered obese.   Maintain normal blood lipids and cholesterol levels by exercising and minimizing your intake of saturated fat. Eat a balanced diet with plenty of fruit and vegetables. Blood tests for lipids and cholesterol should begin at age 20 and be repeated every 5 years. If your lipid or cholesterol levels are high, you are over 50, or you are at high risk for heart disease, you may need your cholesterol levels checked more frequently.Ongoing high lipid and cholesterol levels should be treated with medicines if diet and exercise are not effective.   If you smoke, find out from your caregiver how to quit. If you do not use tobacco, do not start.   If you are pregnant, do not drink alcohol. If you are breastfeeding, be very cautious about drinking alcohol. If you are not pregnant and choose to drink alcohol, do not exceed 1 drink per day. One drink is considered to be 12 ounces (355 mL) of beer, 5 ounces (148 mL) of wine, or 1.5 ounces (44 mL) of liquor.   Avoid use of street drugs. Do not share needles with anyone. Ask for help if you need support or instructions about stopping the use of drugs.   High blood pressure causes heart disease and increases the risk of stroke. Your blood pressure should be checked at least every 1 to 2 years. Ongoing high blood pressure should be treated with medicines if weight loss and exercise are not effective.   If you are 55 to 62   years old, ask your caregiver if you should take aspirin to prevent strokes.   Diabetes screening involves taking a blood sample to check your fasting blood sugar level. This should be done once every 3 years, after age 45, if you are within normal weight and without risk factors for diabetes. Testing should be considered at a younger age or be carried out more frequently if you are overweight and have at least 1 risk factor for diabetes.   Breast cancer screening is essential preventive care for women. You should practice "breast  self-awareness." This means understanding the normal appearance and feel of your breasts and may include breast self-examination. Any changes detected, no matter how small, should be reported to a caregiver. Women in their 20s and 30s should have a clinical breast exam (CBE) by a caregiver as part of a regular health exam every 1 to 3 years. After age 40, women should have a CBE every year. Starting at age 40, women should consider having a mammography (breast X-ray test) every year. Women who have a family history of breast cancer should talk to their caregiver about genetic screening. Women at a high risk of breast cancer should talk to their caregivers about having magnetic resonance imaging (MRI) and a mammography every year.   The Pap test is a screening test for cervical cancer. A Pap test can show cell changes on the cervix that might become cervical cancer if left untreated. A Pap test is a procedure in which cells are obtained and examined from the lower end of the uterus (cervix).   Women should have a Pap test starting at age 21.   Between ages 21 and 29, Pap tests should be repeated every 2 years.   Beginning at age 30, you should have a Pap test every 3 years as long as the past 3 Pap tests have been normal.   Some women have medical problems that increase the chance of getting cervical cancer. Talk to your caregiver about these problems. It is especially important to talk to your caregiver if a new problem develops soon after your last Pap test. In these cases, your caregiver may recommend more frequent screening and Pap tests.   The above recommendations are the same for women who have or have not gotten the vaccine for human papillomavirus (HPV).   If you had a hysterectomy for a problem that was not cancer or a condition that could lead to cancer, then you no longer need Pap tests. Even if you no longer need a Pap test, a regular exam is a good idea to make sure no other problems are  starting.   If you are between ages 65 and 70, and you have had normal Pap tests going back 10 years, you no longer need Pap tests. Even if you no longer need a Pap test, a regular exam is a good idea to make sure no other problems are starting.   If you have had past treatment for cervical cancer or a condition that could lead to cancer, you need Pap tests and screening for cancer for at least 20 years after your treatment.   If Pap tests have been discontinued, risk factors (such as a new sexual partner) need to be reassessed to determine if screening should be resumed.   The HPV test is an additional test that may be used for cervical cancer screening. The HPV test looks for the virus that can cause the cell changes on the cervix.   The cells collected during the Pap test can be tested for HPV. The HPV test could be used to screen women aged 30 years and older, and should be used in women of any age who have unclear Pap test results. After the age of 30, women should have HPV testing at the same frequency as a Pap test.   Colorectal cancer can be detected and often prevented. Most routine colorectal cancer screening begins at the age of 50 and continues through age 75. However, your caregiver may recommend screening at an earlier age if you have risk factors for colon cancer. On a yearly basis, your caregiver may provide home test kits to check for hidden blood in the stool. Use of a small camera at the end of a tube, to directly examine the colon (sigmoidoscopy or colonoscopy), can detect the earliest forms of colorectal cancer. Talk to your caregiver about this at age 50, when routine screening begins. Direct examination of the colon should be repeated every 5 to 10 years through age 75, unless early forms of pre-cancerous polyps or small growths are found.   Hepatitis C blood testing is recommended for all people born from 1945 through 1965 and any individual with known risks for hepatitis C.    Practice safe sex. Use condoms and avoid high-risk sexual practices to reduce the spread of sexually transmitted infections (STIs). STIs include gonorrhea, chlamydia, syphilis, trichomonas, herpes, HPV, and human immunodeficiency virus (HIV). Herpes, HIV, and HPV are viral illnesses that have no cure. They can result in disability, cancer, and death. Sexually active women aged 25 and younger should be checked for chlamydia. Older women with new or multiple partners should also be tested for chlamydia. Testing for other STIs is recommended if you are sexually active and at increased risk.   Osteoporosis is a disease in which the bones lose minerals and strength with aging. This can result in serious bone fractures. The risk of osteoporosis can be identified using a bone density scan. Women ages 65 and over and women at risk for fractures or osteoporosis should discuss screening with their caregivers. Ask your caregiver whether you should take a calcium supplement or vitamin D to reduce the rate of osteoporosis.   Menopause can be associated with physical symptoms and risks. Hormone replacement therapy is available to decrease symptoms and risks. You should talk to your caregiver about whether hormone replacement therapy is right for you.   Use sunscreen with sun protection factor (SPF) of 30 or more. Apply sunscreen liberally and repeatedly throughout the day. You should seek shade when your shadow is shorter than you. Protect yourself by wearing long sleeves, pants, a wide-brimmed hat, and sunglasses year round, whenever you are outdoors.   Once a month, do a whole body skin exam, using a mirror to look at the skin on your back. Notify your caregiver of new moles, moles that have irregular borders, moles that are larger than a pencil eraser, or moles that have changed in shape or color.   Stay current with required immunizations.   Influenza. You need a dose every fall (or winter). The composition of  the flu vaccine changes each year, so being vaccinated once is not enough.   Pneumococcal polysaccharide. You need 1 to 2 doses if you smoke cigarettes or if you have certain chronic medical conditions. You need 1 dose at age 65 (or older) if you have never been vaccinated.   Tetanus, diphtheria, pertussis (Tdap, Td). Get 1 dose of   Tdap vaccine if you are younger than age 65, are over 65 and have contact with an infant, are a healthcare worker, are pregnant, or simply want to be protected from whooping cough. After that, you need a Td booster dose every 10 years. Consult your caregiver if you have not had at least 3 tetanus and diphtheria-containing shots sometime in your life or have a deep or dirty wound.   HPV. You need this vaccine if you are a woman age 26 or younger. The vaccine is given in 3 doses over 6 months.   Measles, mumps, rubella (MMR). You need at least 1 dose of MMR if you were born in 1957 or later. You may also need a second dose.   Meningococcal. If you are age 19 to 21 and a first-year college student living in a residence hall, or have one of several medical conditions, you need to get vaccinated against meningococcal disease. You may also need additional booster doses.   Zoster (shingles). If you are age 60 or older, you should get this vaccine.   Varicella (chickenpox). If you have never had chickenpox or you were vaccinated but received only 1 dose, talk to your caregiver to find out if you need this vaccine.   Hepatitis A. You need this vaccine if you have a specific risk factor for hepatitis A virus infection or you simply wish to be protected from this disease. The vaccine is usually given as 2 doses, 6 to 18 months apart.   Hepatitis B. You need this vaccine if you have a specific risk factor for hepatitis B virus infection or you simply wish to be protected from this disease. The vaccine is given in 3 doses, usually over 6 months.  Preventive Services /  Frequency Ages 19 to 39  Blood pressure check.** / Every 1 to 2 years.   Lipid and cholesterol check.** / Every 5 years beginning at age 20.   Clinical breast exam.** / Every 3 years for women in their 20s and 30s.   Pap test.** / Every 2 years from ages 21 through 29. Every 3 years starting at age 30 through age 65 or 70 with a history of 3 consecutive normal Pap tests.   HPV screening.** / Every 3 years from ages 30 through ages 65 to 70 with a history of 3 consecutive normal Pap tests.   Hepatitis C blood test.** / For any individual with known risks for hepatitis C.   Skin self-exam. / Monthly.   Influenza immunization.** / Every year.   Pneumococcal polysaccharide immunization.** / 1 to 2 doses if you smoke cigarettes or if you have certain chronic medical conditions.   Tetanus, diphtheria, pertussis (Tdap, Td) immunization. / A one-time dose of Tdap vaccine. After that, you need a Td booster dose every 10 years.   HPV immunization. / 3 doses over 6 months, if you are 26 and younger.   Measles, mumps, rubella (MMR) immunization. / You need at least 1 dose of MMR if you were born in 1957 or later. You may also need a second dose.   Meningococcal immunization. / 1 dose if you are age 19 to 21 and a first-year college student living in a residence hall, or have one of several medical conditions, you need to get vaccinated against meningococcal disease. You may also need additional booster doses.   Varicella immunization.** / Consult your caregiver.   Hepatitis A immunization.** / Consult your caregiver. 2 doses, 6 to 18 months   apart.   Hepatitis B immunization.** / Consult your caregiver. 3 doses usually over 6 months.  Ages 40 to 64  Blood pressure check.** / Every 1 to 2 years.   Lipid and cholesterol check.** / Every 5 years beginning at age 20.   Clinical breast exam.** / Every year after age 40.   Mammogram.** / Every year beginning at age 40 and continuing for as  long as you are in good health. Consult with your caregiver.   Pap test.** / Every 3 years starting at age 30 through age 65 or 70 with a history of 3 consecutive normal Pap tests.   HPV screening.** / Every 3 years from ages 30 through ages 65 to 70 with a history of 3 consecutive normal Pap tests.   Fecal occult blood test (FOBT) of stool. / Every year beginning at age 50 and continuing until age 75. You may not need to do this test if you get a colonoscopy every 10 years.   Flexible sigmoidoscopy or colonoscopy.** / Every 5 years for a flexible sigmoidoscopy or every 10 years for a colonoscopy beginning at age 50 and continuing until age 75.   Hepatitis C blood test.** / For all people born from 1945 through 1965 and any individual with known risks for hepatitis C.   Skin self-exam. / Monthly.   Influenza immunization.** / Every year.   Pneumococcal polysaccharide immunization.** / 1 to 2 doses if you smoke cigarettes or if you have certain chronic medical conditions.   Tetanus, diphtheria, pertussis (Tdap, Td) immunization.** / A one-time dose of Tdap vaccine. After that, you need a Td booster dose every 10 years.   Measles, mumps, rubella (MMR) immunization. / You need at least 1 dose of MMR if you were born in 1957 or later. You may also need a second dose.   Varicella immunization.** / Consult your caregiver.   Meningococcal immunization.** / Consult your caregiver.   Hepatitis A immunization.** / Consult your caregiver. 2 doses, 6 to 18 months apart.   Hepatitis B immunization.** / Consult your caregiver. 3 doses, usually over 6 months.  Ages 65 and over  Blood pressure check.** / Every 1 to 2 years.   Lipid and cholesterol check.** / Every 5 years beginning at age 20.   Clinical breast exam.** / Every year after age 40.   Mammogram.** / Every year beginning at age 40 and continuing for as long as you are in good health. Consult with your caregiver.   Pap test.** /  Every 3 years starting at age 30 through age 65 or 70 with a 3 consecutive normal Pap tests. Testing can be stopped between 65 and 70 with 3 consecutive normal Pap tests and no abnormal Pap or HPV tests in the past 10 years.   HPV screening.** / Every 3 years from ages 30 through ages 65 or 70 with a history of 3 consecutive normal Pap tests. Testing can be stopped between 65 and 70 with 3 consecutive normal Pap tests and no abnormal Pap or HPV tests in the past 10 years.   Fecal occult blood test (FOBT) of stool. / Every year beginning at age 50 and continuing until age 75. You may not need to do this test if you get a colonoscopy every 10 years.   Flexible sigmoidoscopy or colonoscopy.** / Every 5 years for a flexible sigmoidoscopy or every 10 years for a colonoscopy beginning at age 50 and continuing until age 75.   Hepatitis   C blood test.** / For all people born from [birth year] through 1965 and any individual with known risks for hepatitis C.   Osteoporosis screening.** / A one-time screening for women ages 66 and over and women at risk for fractures or osteoporosis.   Skin self-exam. / Monthly.   Influenza immunization.** / Every year.   Pneumococcal polysaccharide immunization.** / 1 dose at age 36 (or older) if you have never been vaccinated.   Tetanus, diphtheria, pertussis (Tdap, Td) immunization. / A one-time dose of Tdap vaccine if you are over 65 and have contact with an infant, are a Research scientist (physical sciences), or simply want to be protected from whooping cough. After that, you need a Td booster dose every 10 years.   Varicella immunization.** / Consult your caregiver.   Meningococcal immunization.** / Consult your caregiver.   Hepatitis A immunization.** / Consult your caregiver. 2 doses, 6 to 18 months apart.   Hepatitis B immunization.** / Check with your caregiver. 3 doses, usually over 6 months.  ** Family history and personal history of risk and conditions may change your caregiver's  recommendations. Document Released: 05/03/2001 Document Revised: 02/24/2011 Document Reviewed: 08/02/2010 [facility] Patient Information 2012 [place], [place].Hypothyroidism The thyroid is a large gland located in the lower front of your neck. The thyroid gland helps control metabolism. Metabolism is how your body handles food. It controls metabolism with the hormone thyroxine. When this gland is underactive (hypothyroid), it produces too little hormone.  CAUSES These include:   Absence or destruction of thyroid tissue.   Goiter due to iodine deficiency.   Goiter due to medications.   Congenital defects (since birth).   Problems with the pituitary. This causes a lack of TSH (thyroid stimulating hormone). This hormone tells the thyroid to turn out more hormone.  SYMPTOMS  Lethargy (feeling as though you have no energy)   Cold intolerance   Weight gain (in spite of normal food intake)   Dry skin   Coarse hair   Menstrual irregularity (if severe, may lead to infertility)   Slowing of thought processes  Cardiac problems are also caused by insufficient amounts of thyroid hormone. Hypothyroidism in the newborn is cretinism, and is an extreme form. It is important that this form be treated adequately and immediately or it will lead rapidly to retarded physical and mental development. DIAGNOSIS  To prove hypothyroidism, your caregiver may do blood tests and ultrasound tests. Sometimes the signs are hidden. It may be necessary for your caregiver to watch this illness with blood tests either before or after diagnosis and treatment. TREATMENT  Low levels of thyroid hormone are increased by using synthetic thyroid hormone. This is a safe, effective treatment. It usually takes about four weeks to gain the full effects of the medication. After you have the full effect of the medication, it will generally take another four weeks for problems to leave. Your caregiver may start you on low doses. If  you have had heart problems the dose may be gradually increased. It is generally not an emergency to get rapidly to normal. HOME CARE INSTRUCTIONS   Take your medications as your caregiver suggests. Let your caregiver know of any medications you are taking or start taking. Your caregiver will help you with dosage schedules.   As your condition improves, your dosage needs may increase. It will be necessary to have continuing blood tests as suggested by your caregiver.   Report all suspected medication side effects to your caregiver.  SEEK MEDICAL CARE IF:  Seek medical care if you develop:  Sweating.   Tremulousness (tremors).   Anxiety.   Rapid weight loss.   Heat intolerance.   Emotional swings.   Diarrhea.   Weakness.  SEEK IMMEDIATE MEDICAL CARE IF:  You develop chest pain, an irregular heart beat (palpitations), or a rapid heart beat. MAKE SURE YOU:   Understand these instructions.   Will watch your condition.   Will get help right away if you are not doing well or get worse.  Document Released: 03/07/2005 Document Revised: 02/24/2011 Document Reviewed: 10/26/2007 [facility] Patient Information 2012 [place], [place].

## 2011-12-18 NOTE — Assessment & Plan Note (Signed)
She is on Evista at the direction of another doctor

## 2012-01-02 ENCOUNTER — Telehealth: Payer: Self-pay | Admitting: Internal Medicine

## 2012-01-02 NOTE — Telephone Encounter (Signed)
Caller: Hien/Patient; Patient Name: [name]; PCP: Sanda Linger (Adults only); Best Callback Phone Number: [phone] Woke up with R arm pain and wrist hurting- onset 12/30/11 and arm felt weak. Pain in arm was worse if she moved it. On 12/31/11 arm numb all day and then she felt neck pop and pain in wrist and numbness improved. Last night she woke with some numbness again in arm which improved after she moved it around. She has been having mild headaches at the base of her neck on and off and neck feels sore. Triage and Care advice per Arm Non-Injury Protocol and appointment advised within 24 hours for "new onset mild to moderate pain that has not improved with 24hours of home care". Appointment scheduled at 1100 on 01/03/12.

## 2012-01-03 ENCOUNTER — Other Ambulatory Visit (HOSPITAL_COMMUNITY): Payer: Self-pay | Admitting: Internal Medicine

## 2012-01-03 ENCOUNTER — Other Ambulatory Visit: Payer: Self-pay

## 2012-01-03 ENCOUNTER — Ambulatory Visit (HOSPITAL_COMMUNITY)
Admission: RE | Admit: 2012-01-03 | Discharge: 2012-01-03 | Disposition: A | Discharge status: Home / Self Care | Payer: Medicare Other | Source: Ambulatory Visit | Attending: Internal Medicine | Admitting: Internal Medicine

## 2012-01-03 ENCOUNTER — Ambulatory Visit: Payer: Medicare Other

## 2012-01-03 ENCOUNTER — Ambulatory Visit (INDEPENDENT_AMBULATORY_CARE_PROVIDER_SITE_OTHER): Payer: Private Health Insurance - Indemnity | Admitting: Internal Medicine

## 2012-01-03 ENCOUNTER — Encounter: Payer: Self-pay | Admitting: Internal Medicine

## 2012-01-03 VITALS — BP 118/80 | HR 60 | Temp 98.5°F | Resp 16 | Ht 66.0 in | Wt 148.4 lb

## 2012-01-03 DIAGNOSIS — M542 Cervicalgia: Secondary | ICD-10-CM

## 2012-01-03 DIAGNOSIS — M4802 Spinal stenosis, cervical region: Secondary | ICD-10-CM

## 2012-01-03 DIAGNOSIS — M4692 Unspecified inflammatory spondylopathy, cervical region: Secondary | ICD-10-CM

## 2012-01-03 DIAGNOSIS — R209 Unspecified disturbances of skin sensation: Secondary | ICD-10-CM | POA: Insufficient documentation

## 2012-01-03 DIAGNOSIS — Z95810 Presence of automatic (implantable) cardiac defibrillator: Secondary | ICD-10-CM | POA: Insufficient documentation

## 2012-01-03 DIAGNOSIS — M4680 Other specified inflammatory spondylopathies, site unspecified: Secondary | ICD-10-CM

## 2012-01-03 DIAGNOSIS — M5030 Other cervical disc degeneration, unspecified cervical region: Secondary | ICD-10-CM | POA: Insufficient documentation

## 2012-01-03 DIAGNOSIS — M5412 Radiculopathy, cervical region: Secondary | ICD-10-CM

## 2012-01-03 DIAGNOSIS — I700 Atherosclerosis of aorta: Secondary | ICD-10-CM | POA: Insufficient documentation

## 2012-01-03 MED ORDER — PREGABALIN 50 MG PO CAPS: 50.0000 mg | ORAL_CAPSULE | Freq: Three times a day (TID) | ORAL | Status: DC

## 2012-01-03 MED ORDER — RALOXIFENE HCL 60 MG PO TABS: 60.0000 mg | ORAL_TABLET | Freq: Every day | ORAL | Status: AC

## 2012-01-03 NOTE — Patient Instructions (Signed)
Torticollis, Acute You have suddenly (acutely) developed a twisted neck (torticollis). This is usually a self-limited condition. CAUSES  Acute torticollis may be caused by malposition, trauma or infection. Most commonly, acute torticollis is caused by sleeping in an awkward position. Torticollis may also be caused by the flexion, extension or twisting of the neck muscles beyond their normal position. Sometimes, the exact cause may not be known. SYMPTOMS  Usually, there is pain and limited movement of the neck. Your neck may twist to one side. DIAGNOSIS  The diagnosis is often made by physical examination. X-rays, CT scans or MRIs may be done if there is a history of trauma or concern of infection. TREATMENT  For a common, stiff neck that develops during sleep, treatment is focused on relaxing the contracted neck muscle. Medications (including shots) may be used to treat the problem. Most cases resolve in several days. Torticollis usually responds to conservative physical therapy. If left untreated, the shortened and spastic neck muscle can cause deformities in the face and neck. Rarely, surgery is required. HOME CARE INSTRUCTIONS   Use over-the-counter and prescription medications as directed by your caregiver.  Do stretching exercises and massage the neck as directed by your caregiver.  Follow up with physical therapy if needed and as directed by your caregiver. SEEK IMMEDIATE MEDICAL CARE IF:   You develop difficulty breathing or noisy breathing (stridor).  You drool, develop trouble swallowing or have pain with swallowing.  You develop numbness or weakness in the hands or feet.  You have changes in speech or vision.  You have problems with urination or bowel movements.  You have difficulty walking.  You have a fever.  You have increased pain. MAKE SURE YOU:   Understand these instructions.  Will watch your condition.  Will get help right away if you are not doing well or  get worse. Document Released: 03/04/2000 Document Revised: 05/30/2011 Document Reviewed: 04/15/2009 [employer] Patient Information 2013 [employer].  

## 2012-01-03 NOTE — Assessment & Plan Note (Signed)
I have asked her to get a CT of c-spine done to see if she has hnp, nerve impingement, spinal stenosis

## 2012-01-03 NOTE — Assessment & Plan Note (Signed)
Will start lyrica for pain, check plain films today

## 2012-01-03 NOTE — Progress Notes (Signed)
Subjective:    Patient ID: KHOU TUREAUD, female    DOB: 11/17/1950, 61 y.o.   MRN: [id number]  Neck Pain  This is a new problem. The current episode started in the past 7 days. The problem occurs intermittently. The problem has been unchanged. The pain is associated with a twisting injury. The pain is present in the right side. The quality of the pain is described as stabbing and shooting. The pain is at a severity of 3/10. The pain is mild. Nothing aggravates the symptoms. The pain is worse during the night. Associated symptoms include numbness (right arm), tingling (right arm) and weakness (right arm). Pertinent negatives include no chest pain, fever, headaches, leg pain, pain with swallowing, paresis, photophobia, syncope, trouble swallowing, visual change or weight loss. She has tried nothing for the symptoms. The treatment provided no relief.      Review of Systems  Constitutional: Negative for fever, chills, weight loss, diaphoresis, activity change, appetite change, fatigue and unexpected weight change.  HENT: Positive for neck pain and neck stiffness. Negative for sore throat, facial swelling, trouble swallowing and voice change.   Eyes: Negative.  Negative for photophobia.  Respiratory: Negative for choking, chest tightness, shortness of breath, wheezing and stridor.   Cardiovascular: Negative for chest pain, palpitations, leg swelling and syncope.  Gastrointestinal: Negative.   Musculoskeletal: Negative for myalgias, back pain, joint swelling, arthralgias and gait problem.  Skin: Negative for color change, pallor, rash and wound.  Neurological: Positive for tingling (right arm), weakness (right arm) and numbness (right arm). Negative for dizziness, tremors, seizures, syncope, facial asymmetry, speech difficulty, light-headedness and headaches.  Hematological: Negative for adenopathy. Does not bruise/bleed easily.  Psychiatric/Behavioral: Negative.        Objective:   Physical  Exam  Vitals reviewed. Constitutional: She is oriented to person, place, and time. She appears well-developed and well-nourished. No distress.  HENT:  Head: Normocephalic and atraumatic.  Mouth/Throat: Oropharynx is clear and moist. No oropharyngeal exudate.  Eyes: Conjunctivae normal are normal. Right eye exhibits no discharge. Left eye exhibits no discharge. No scleral icterus.  Neck: Normal range of motion. Neck supple. No JVD present. No tracheal deviation present. No thyromegaly present.  Cardiovascular: Normal rate, regular rhythm, normal heart sounds and intact distal pulses.  Exam reveals no gallop and no friction rub.   No murmur heard. Pulmonary/Chest: Effort normal and breath sounds normal. No stridor. No respiratory distress. She has no wheezes. She has no rales. She exhibits no tenderness.  Abdominal: Soft. Bowel sounds are normal. She exhibits no distension and no mass. There is no tenderness. There is no rebound and no guarding.  Musculoskeletal:       Cervical back: Normal. She exhibits normal range of motion, no tenderness, no bony tenderness, no swelling, no edema, no deformity, no laceration, no pain, no spasm and normal pulse.  Lymphadenopathy:    She has no cervical adenopathy.  Neurological: She is alert and oriented to person, place, and time. She has normal strength. She displays abnormal reflex. She displays no atrophy and no tremor. No cranial nerve deficit or sensory deficit. She exhibits normal muscle tone. She displays a negative Romberg sign. She displays no seizure activity. Coordination and gait normal.  Reflex Scores:      Tricep reflexes are 1+ on the right side and 2+ on the left side.      Bicep reflexes are 1+ on the right side and 2+ on the left side.  Brachioradialis reflexes are 1+ on the right side and 2+ on the left side.      Patellar reflexes are 0 on the right side and 0 on the left side.      Achilles reflexes are 0 on the right side and 0 on  the left side. Skin: Skin is warm and dry. No rash noted. She is not diaphoretic. No erythema. No pallor.  Psychiatric: She has a normal mood and affect. Her behavior is normal. Judgment and thought content normal.      Lab Results  Component Value Date   WBC 4.1* 12/15/2011   HGB 13.7 12/15/2011   HCT 42.1 12/15/2011   PLT 160.0 12/15/2011   GLUCOSE 105* 12/15/2011   CHOL 225* 12/15/2011   TRIG 100.0 12/15/2011   HDL 87.20 12/15/2011   LDLDIRECT 126.3 12/15/2011   LDLCALC 94 02/18/2011   ALT 38* 12/15/2011   AST 62* 12/15/2011   NA 135 12/15/2011   K 4.5 12/15/2011   CL 99 12/15/2011   CREATININE 1.3* 12/15/2011   BUN 21 12/15/2011   CO2 27 12/15/2011   TSH 1.21 12/15/2011   INR 1.8 11/03/2011   HGBA1C 5.1 02/17/2011      Assessment & Plan:

## 2012-01-04 DIAGNOSIS — M4802 Spinal stenosis, cervical region: Secondary | ICD-10-CM | POA: Insufficient documentation

## 2012-01-04 NOTE — Addendum Note (Signed)
Addended by: Etta Grandchild on: 01/04/2012 01:52 PM   Modules accepted: Orders

## 2012-01-09 ENCOUNTER — Other Ambulatory Visit: Payer: Self-pay | Admitting: Internal Medicine

## 2012-01-09 ENCOUNTER — Ambulatory Visit (HOSPITAL_COMMUNITY)
Admission: RE | Admit: 2012-01-09 | Discharge: 2012-01-09 | Disposition: A | Discharge status: Home / Self Care | Payer: Medicare Other | Source: Ambulatory Visit | Attending: Internal Medicine | Admitting: Internal Medicine

## 2012-01-09 DIAGNOSIS — S32020A Wedge compression fracture of second lumbar vertebra, initial encounter for closed fracture: Secondary | ICD-10-CM

## 2012-01-09 DIAGNOSIS — M818 Other osteoporosis without current pathological fracture: Secondary | ICD-10-CM

## 2012-01-09 DIAGNOSIS — Z13820 Encounter for screening for osteoporosis: Secondary | ICD-10-CM | POA: Insufficient documentation

## 2012-01-09 DIAGNOSIS — Z780 Asymptomatic menopausal state: Secondary | ICD-10-CM | POA: Insufficient documentation

## 2012-01-09 DIAGNOSIS — Z1231 Encounter for screening mammogram for malignant neoplasm of breast: Secondary | ICD-10-CM

## 2012-01-10 LAB — HM MAMMOGRAPHY: HM Mammogram: NORMAL

## 2012-01-13 ENCOUNTER — Telehealth: Payer: Self-pay

## 2012-01-13 NOTE — Telephone Encounter (Signed)
Pt advised of results and states that Lyrica has helped with controlling her sxs.

## 2012-01-13 NOTE — Telephone Encounter (Signed)
Pt called requesting the results of xray and CT scan

## 2012-01-13 NOTE — Telephone Encounter (Signed)
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

## 2012-02-06 ENCOUNTER — Other Ambulatory Visit: Payer: Self-pay | Admitting: Cardiovascular Disease

## 2012-02-06 NOTE — Telephone Encounter (Signed)
Fax Received. Refill Completed. Katharyn Schauer Chowoe (R.M.A)   

## 2012-02-07 ENCOUNTER — Other Ambulatory Visit: Payer: Self-pay | Admitting: Internal Medicine

## 2012-02-21 ENCOUNTER — Encounter (HOSPITAL_COMMUNITY): Payer: Self-pay | Admitting: Emergency Medicine

## 2012-02-21 ENCOUNTER — Emergency Department (HOSPITAL_COMMUNITY): Payer: Medicare Other

## 2012-02-21 ENCOUNTER — Emergency Department (HOSPITAL_COMMUNITY)
Admission: EM | Admit: 2012-02-21 | Discharge: 2012-02-21 | Disposition: A | Discharge status: Home / Self Care | Payer: Medicare Other | Attending: Emergency Medicine | Admitting: Emergency Medicine

## 2012-02-21 DIAGNOSIS — K219 Gastro-esophageal reflux disease without esophagitis: Secondary | ICD-10-CM | POA: Insufficient documentation

## 2012-02-21 DIAGNOSIS — R110 Nausea: Secondary | ICD-10-CM | POA: Insufficient documentation

## 2012-02-21 DIAGNOSIS — I5022 Chronic systolic (congestive) heart failure: Secondary | ICD-10-CM | POA: Insufficient documentation

## 2012-02-21 DIAGNOSIS — Z8781 Personal history of (healed) traumatic fracture: Secondary | ICD-10-CM | POA: Insufficient documentation

## 2012-02-21 DIAGNOSIS — Z79899 Other long term (current) drug therapy: Secondary | ICD-10-CM | POA: Insufficient documentation

## 2012-02-21 DIAGNOSIS — Z8673 Personal history of transient ischemic attack (TIA), and cerebral infarction without residual deficits: Secondary | ICD-10-CM | POA: Insufficient documentation

## 2012-02-21 DIAGNOSIS — IMO0002 Reserved for concepts with insufficient information to code with codable children: Secondary | ICD-10-CM | POA: Insufficient documentation

## 2012-02-21 DIAGNOSIS — M549 Dorsalgia, unspecified: Secondary | ICD-10-CM

## 2012-02-21 DIAGNOSIS — Z87891 Personal history of nicotine dependence: Secondary | ICD-10-CM | POA: Insufficient documentation

## 2012-02-21 DIAGNOSIS — M545 Low back pain, unspecified: Secondary | ICD-10-CM | POA: Insufficient documentation

## 2012-02-21 DIAGNOSIS — Z7901 Long term (current) use of anticoagulants: Secondary | ICD-10-CM | POA: Insufficient documentation

## 2012-02-21 DIAGNOSIS — I2589 Other forms of chronic ischemic heart disease: Secondary | ICD-10-CM | POA: Insufficient documentation

## 2012-02-21 DIAGNOSIS — M48061 Spinal stenosis, lumbar region without neurogenic claudication: Secondary | ICD-10-CM | POA: Insufficient documentation

## 2012-02-21 DIAGNOSIS — I2510 Atherosclerotic heart disease of native coronary artery without angina pectoris: Secondary | ICD-10-CM | POA: Insufficient documentation

## 2012-02-21 DIAGNOSIS — Z8719 Personal history of other diseases of the digestive system: Secondary | ICD-10-CM | POA: Insufficient documentation

## 2012-02-21 DIAGNOSIS — N183 Chronic kidney disease, stage 3 unspecified: Secondary | ICD-10-CM | POA: Insufficient documentation

## 2012-02-21 DIAGNOSIS — I729 Aneurysm of unspecified site: Secondary | ICD-10-CM | POA: Insufficient documentation

## 2012-02-21 DIAGNOSIS — Z862 Personal history of diseases of the blood and blood-forming organs and certain disorders involving the immune mechanism: Secondary | ICD-10-CM | POA: Insufficient documentation

## 2012-02-21 DIAGNOSIS — E039 Hypothyroidism, unspecified: Secondary | ICD-10-CM | POA: Insufficient documentation

## 2012-02-21 DIAGNOSIS — M1990 Unspecified osteoarthritis, unspecified site: Secondary | ICD-10-CM | POA: Insufficient documentation

## 2012-02-21 DIAGNOSIS — I129 Hypertensive chronic kidney disease with stage 1 through stage 4 chronic kidney disease, or unspecified chronic kidney disease: Secondary | ICD-10-CM | POA: Insufficient documentation

## 2012-02-21 DIAGNOSIS — E785 Hyperlipidemia, unspecified: Secondary | ICD-10-CM | POA: Insufficient documentation

## 2012-02-21 DIAGNOSIS — Z7982 Long term (current) use of aspirin: Secondary | ICD-10-CM | POA: Insufficient documentation

## 2012-02-21 DIAGNOSIS — I498 Other specified cardiac arrhythmias: Secondary | ICD-10-CM | POA: Insufficient documentation

## 2012-02-21 DIAGNOSIS — I359 Nonrheumatic aortic valve disorder, unspecified: Secondary | ICD-10-CM | POA: Insufficient documentation

## 2012-02-21 LAB — URINALYSIS, ROUTINE W REFLEX MICROSCOPIC
Protein, ur: NEGATIVE mg/dL
Urobilinogen, UA: 0.2 mg/dL (ref 0.0–1.0)

## 2012-02-21 LAB — URINE MICROSCOPIC-ADD ON

## 2012-02-21 MED ORDER — HYDROCODONE-ACETAMINOPHEN 5-325 MG PO TABS
1.0000 | ORAL_TABLET | Freq: Once | ORAL | Status: AC
  Administered 2012-02-21: 1 via ORAL
  Filled 2012-02-21: qty 1

## 2012-02-21 MED ORDER — HYDROCODONE-ACETAMINOPHEN 5-325 MG PO TABS: 1.0000 | ORAL_TABLET | Freq: Four times a day (QID) | ORAL | Status: DC | PRN

## 2012-02-21 NOTE — ED Provider Notes (Signed)
History    This chart was scribed for Becky Munch, MD, MD by [name], ED Scribe. The patient was seen in room TR09C and the patient's care was started at 1:14PM.   CSN: [id number]  Arrival date & time 02/21/12  1230   None     Chief Complaint  Patient presents with  . Back Pain    The history is provided by the patient. No language interpreter was used.   [name] is a 61 y.o. female who presents to the Emergency Department BIB EMS complaining of constant, lower right back pain onset today when pt awoke. Pt reports that she had compression fracture in lumbar vertebrae 5 months ago. She reports hx of 4 back surgeries. Denies new back injuries or falls. Pt states she has nausea. Mentions that her urine has odor. Denies incontinence, dysuria, vomiting, fever, chills, abdominal pain, headache and any other pain. Pt has taken Vicodin with minor relief. Pain is aggravated by movement.   Past Medical History  Diagnosis Date  . Spinal stenosis   . Numbness of foot     left foot  . Diverticulitis     Status post partial colectomy 1/12 with reversal in July 2012  . Colitis, ischemic   . DJD (degenerative joint disease)     History of multiple surgeries to the back, shoulder and knee  . Ischemic cardiomyopathy     Echo 06/16/10: EF 25-30%, anteroseptal and apical hypokinesis, moderate AI, mild MR  . Chronic systolic heart failure   . CAD (coronary artery disease)     a.  Ant MI 8/03 with stenting of the LAD;  b. staged PCI with Cypher DES to OM1 8/03;  c. s/p Cypher DES to LAD 7/04;  d.  multiple cardiac caths in past (chronically abnl ECG);    e. cardiac catheterization 3/12: LAD stent patent, distal LAD 40-50%, small ostial D1 80%, ostial D2 60%, OM-1 stent patent (20-30%), proximal RCA 50%, proximal to mid RCA 40-50%; f. cath 02/17/2011 - nonobs  . LV (left ventricular) mural thrombus     Chronic Coumadin therapy  . CKD (chronic kidney disease), stage III     creatinine:   1.3 in 4/12;    . Tobacco abuse     history  . GERD (gastroesophageal reflux disease)   . Hypertension   . Lower GI bleed     August 2011  . Hypothyroidism   . Aortic insufficiency   . Pseudoaneurysm     History of, right forearm  . Hyperlipidemia   . OA (osteoarthritis)   . Abnormal EKG     Chronically abnormal EKG.   . Spinal stenosis   . Numbness of foot     Left foot  . Compression fracture 07/04/11    L2  . Anterior myocardial infarction 10/2001  . SVT (supraventricular tachycardia)     ? h/o AFib;  patient on long term amiodarone therapy  . PAF (paroxysmal atrial fibrillation)   . Anatomical narrow angle of right eye   . Heart murmur     "age 8-19"  . Shortness of breath     "at times; related to CHF"  . Shortness of breath on exertion   . 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 Graciela Husbands.   [name] AICD (automatic cardioverter/defibrillator) present 10/2004    explant; implant  . Blood transfusion   . Anemia   . H/O hiatal hernia   . Stroke  History of TIA and possible CVA; hospitalized 2004; on coumadin  . Systolic heart failure     Past Surgical History  Procedure Date  . Back surgery   . Coronary angioplasty with stent placement 10/2001; 09/2002  . Cardiac defibrillator placement 07/2003; 10/2004  . Lumbar laminectomy/decompression microdiscectomy 10/2001    L5-S1/E-chart  . Knee arthroscopy     left  . Thyroidectomy 1970's  . Total knee arthroplasty 11/2003    left  . X-stop implantation 12/2004    L3-4; L4-5  . Fixation kyphoplasty thoracic spine 07/2007    T3, 4, 6 compression fractures  . Shoulder open rotator cuff repair 04/2008    right  . Lumbar laminectomy/decompression microdiscectomy 01/2010  . Colectomy 03/2010    sigmoid left  . Colostomy 03/2010    transverse  . Peripherally inserted central catheter insertion 03/2010    removed upon discharge  . Colostomy closure 12/2010    reversal  . Tonsillectomy and  adenoidectomy 1969  . Appendectomy 03/2010  . Cataract extraction     right    Family History  Problem Relation Age of Onset  . Diabetes Mother   . Diabetes Brother   . Arthritis      family history  . Prostate cancer      Family History    History  Substance Use Topics  . Smoking status: Former Smoker -- 0.1 packs/day for 33 years    Types: Cigarettes    Quit date: 04/21/2001  . Smokeless tobacco: Never Used     Comment: "smoked ~ 1 pack/month"  . Alcohol Use: No     Comment: 07/05/11 "occasionally; last time was glass of wine last week"    OB History    Grav Para Term Preterm Abortions TAB SAB Ect Mult Living                  Review of Systems  Constitutional:       Per HPI, otherwise negative  HENT:       Per HPI, otherwise negative  Eyes: Negative.   Respiratory:       Per HPI, otherwise negative  Cardiovascular:       Per HPI, otherwise negative  Gastrointestinal: Negative for vomiting.  Genitourinary: Negative.   Musculoskeletal:       Per HPI, otherwise negative  Skin: Negative.   Neurological: Negative for syncope.    Allergies  Lansoprazole; Penicillins; and Statins  Home Medications   Current Outpatient Rx  Name  Route  Sig  Dispense  Refill  . AMIODARONE HCL 200 MG PO TABS      TAKE 1 TABLET DAILY   90 tablet   3   . ASPIRIN EC 81 MG PO TBEC   Oral   Take 81 mg by mouth daily.         . BUSPIRONE HCL 15 MG PO TABS   Oral   Take 15 mg by mouth daily as needed. For mood stabilization.         [name] CALCIUM CARBONATE-VITAMIN D 500-200 MG-UNIT PO TABS   Oral   Take 1 tablet by mouth 2 (two) times daily with a meal.   60 tablet   1   . CARVEDILOL 12.5 MG PO TABS   Oral   Take 1 tablet (12.5 mg total) by mouth 2 (two) times daily with a meal.   60 tablet   6   . CYCLOBENZAPRINE HCL 5 MG PO TABS   Oral  Take 5 mg by mouth 2 (two) times daily as needed. For muscle spasms.         [name] EZETIMIBE 10 MG PO TABS   Oral   Take 10  mg by mouth daily.         [name] FEXOFENADINE HCL 180 MG PO TABS   Oral   Take 180 mg by mouth daily.          . OMEGA-3 FATTY ACIDS 1000 MG PO CAPS   Oral   Take 1,000 mg by mouth daily.           [name] FLUTICASONE PROPIONATE 50 MCG/ACT NA SUSP   Nasal   Place 2 sprays into the nose daily as needed. For allergies         . FA-PYRIDOXINE-CYANCOBALAMIN 2.5-25-2 MG PO TABS   Oral   Take 1 tablet by mouth daily.           . FUROSEMIDE 20 MG PO TABS   Oral   Take 1 tablet (20 mg total) by mouth daily.   30 tablet   1   . HYDROCODONE-ACETAMINOPHEN 5-325 MG PO TABS   Oral   Take 1 tablet by mouth every 6 (six) hours as needed. For pain         . LEVOTHYROXINE SODIUM 200 MCG PO TABS   Oral   Take 1 tablet (200 mcg total) by mouth daily.   30 tablet   6   . LISINOPRIL 5 MG PO TABS   Oral   Take 2.5 mg by mouth 2 (two) times daily.         [name] METHOCARBAMOL 500 MG PO TABS   Oral   Take 500 mg by mouth 4 (four) times daily as needed. For spasms          . NITROGLYCERIN 0.4 MG SL SUBL   Sublingual   Place 0.4 mg under the tongue every 5 (five) minutes as needed. For chest pain         . PANTOPRAZOLE SODIUM 40 MG PO TBEC   Oral   Take 40 mg by mouth 2 (two) times daily.          [name] PREGABALIN 50 MG PO CAPS   Oral   Take 1 capsule (50 mg total) by mouth 3 (three) times daily.   90 capsule   5   . RALOXIFENE HCL 60 MG PO TABS   Oral   Take 60 mg by mouth daily.         . SENNA 8.6 MG PO TABS   Oral   Take 2 tablets by mouth at bedtime.         . WARFARIN SODIUM 3 MG PO TABS   Oral   Take 3 mg by mouth every evening.            BP 135/72  Pulse 67  Temp 97.9 F (36.6 C) (Oral)  Resp 16  SpO2 98%  Physical Exam  Nursing note and vitals reviewed. Constitutional: She is oriented to person, place, and time. She appears well-developed and well-nourished. No distress.  HENT:  Head: Normocephalic and atraumatic.  Eyes: Conjunctivae normal are  normal.  Neck: Neck supple. No tracheal deviation present.  Cardiovascular: Normal rate.   Pulmonary/Chest: Effort normal. No respiratory distress.  Musculoskeletal: Normal range of motion. She exhibits no edema.       Upper lumbar and lower thoracic pain   Neurological: She is alert and oriented to  person, place, and time.  Skin: Skin is warm and dry.  Psychiatric: She has a normal mood and affect. Her behavior is normal.    ED Course  Procedures (including critical care time) DIAGNOSTIC STUDIES:   COORDINATION OF CARE: 1:18 PM Discussed ED treatment with pt  1:22 PM Ordered:    . HYDROcodone-acetaminophen  1 tablet Oral Once        Labs Reviewed  URINALYSIS, ROUTINE W REFLEX MICROSCOPIC   Dg Thoracic Spine 2 View  02/21/2012  *RADIOLOGY REPORT*  Clinical Data: Lower right thoracic and upper right lumbar pain for 2 days.  No known injury.  THORACIC SPINE - 2 VIEW  Comparison: Prior chest radiographs ranging from 06/17/2010 through 09/14/2011.  Findings: There are stable findings at T3, T4 and T6 status post spinal augmentation. An old fracture involving the superior endplate of T9 is stable.  There is no evidence of acute fracture or paraspinal hematoma.  Left subclavian AICD and coronary artery stents are noted.  IMPRESSION: Stable thoracic compression deformities and post spinal augmentation findings.  No evidence of acute fracture.   Original Report Authenticated By: Carey Bullocks, M.D.    Dg Lumbar Spine 2-3 Views  02/21/2012  *RADIOLOGY REPORT*  Clinical Data: Lower right thoracic and upper right lumbar pain for 2 days.  No known injury.  LUMBAR SPINE - 2-3 VIEW  Comparison: 01/27/2012 radiographs.  Findings: Patient is status post L3-S1 fusion with bilateral pedicle screws, interconnecting rods and interbody spacers.  The hardware appears unchanged.  There appears to be some chronic loosening of the left S1 pedicle screw.  A superior endplate compression deformity at L2 is  unchanged, resulting in approximately 50% loss of vertebral body height.  No acute fractures are identified.  There is a stable anterolisthesis at L5- S1.  IMPRESSION: Stable appearance of the lumbar spine status post L3-S1 fusion. Chronic L2 compression deformity appears unchanged.   Original Report Authenticated By: Carey Bullocks, M.D.      No diagnosis found.    MDM  I personally performed the services described in this documentation, which was scribed in my presence. The recorded information has been reviewed and is accurate.  This patient presents with concerns of ongoing low back pain.  Notably, she has a history of compression fractures, and describes new floaters urine.  On exam she is neurologically intact, in no distress with unremarkable vital signs.  Given her history she had both radiologic and laboratory studies.  These were largely reassuring, though her urine suggestive mild dehydration.  A culture was sent.  The patient was discharged in stable condition.  Becky Munch, MD 02/21/12 [phone]

## 2012-02-21 NOTE — ED Notes (Signed)
Per EMS: pt c/o lower back pain starting upon waking this am; pt with hx of lumbar fx in past; pt denies new injury or fall

## 2012-02-24 ENCOUNTER — Other Ambulatory Visit: Payer: Self-pay | Admitting: Cardiology

## 2012-02-24 ENCOUNTER — Other Ambulatory Visit: Payer: Self-pay | Admitting: Cardiovascular Disease

## 2012-02-27 ENCOUNTER — Emergency Department (HOSPITAL_COMMUNITY)
Admission: EM | Admit: 2012-02-27 | Discharge: 2012-02-28 | Disposition: A | Discharge status: Home / Self Care | Payer: Medicare Other | Source: Home / Self Care | Attending: Emergency Medicine | Admitting: Emergency Medicine

## 2012-02-27 ENCOUNTER — Telehealth: Payer: Self-pay | Admitting: Internal Medicine

## 2012-02-27 ENCOUNTER — Ambulatory Visit: Payer: Self-pay | Admitting: Internal Medicine

## 2012-02-27 ENCOUNTER — Emergency Department (HOSPITAL_COMMUNITY): Payer: Medicare Other

## 2012-02-27 ENCOUNTER — Encounter (HOSPITAL_COMMUNITY): Payer: Self-pay

## 2012-02-27 DIAGNOSIS — Z87891 Personal history of nicotine dependence: Secondary | ICD-10-CM | POA: Insufficient documentation

## 2012-02-27 DIAGNOSIS — K5900 Constipation, unspecified: Secondary | ICD-10-CM | POA: Insufficient documentation

## 2012-02-27 DIAGNOSIS — Z8709 Personal history of other diseases of the respiratory system: Secondary | ICD-10-CM | POA: Insufficient documentation

## 2012-02-27 DIAGNOSIS — M1990 Unspecified osteoarthritis, unspecified site: Secondary | ICD-10-CM | POA: Insufficient documentation

## 2012-02-27 DIAGNOSIS — Z95810 Presence of automatic (implantable) cardiac defibrillator: Secondary | ICD-10-CM | POA: Insufficient documentation

## 2012-02-27 DIAGNOSIS — N183 Chronic kidney disease, stage 3 unspecified: Secondary | ICD-10-CM | POA: Insufficient documentation

## 2012-02-27 DIAGNOSIS — Z79899 Other long term (current) drug therapy: Secondary | ICD-10-CM | POA: Insufficient documentation

## 2012-02-27 DIAGNOSIS — K219 Gastro-esophageal reflux disease without esophagitis: Secondary | ICD-10-CM | POA: Insufficient documentation

## 2012-02-27 DIAGNOSIS — M549 Dorsalgia, unspecified: Secondary | ICD-10-CM | POA: Insufficient documentation

## 2012-02-27 DIAGNOSIS — Z8679 Personal history of other diseases of the circulatory system: Secondary | ICD-10-CM | POA: Insufficient documentation

## 2012-02-27 DIAGNOSIS — I2510 Atherosclerotic heart disease of native coronary artery without angina pectoris: Secondary | ICD-10-CM | POA: Insufficient documentation

## 2012-02-27 DIAGNOSIS — Z7901 Long term (current) use of anticoagulants: Secondary | ICD-10-CM | POA: Insufficient documentation

## 2012-02-27 DIAGNOSIS — Z8719 Personal history of other diseases of the digestive system: Secondary | ICD-10-CM | POA: Insufficient documentation

## 2012-02-27 DIAGNOSIS — Z8673 Personal history of transient ischemic attack (TIA), and cerebral infarction without residual deficits: Secondary | ICD-10-CM | POA: Insufficient documentation

## 2012-02-27 DIAGNOSIS — Z8669 Personal history of other diseases of the nervous system and sense organs: Secondary | ICD-10-CM | POA: Insufficient documentation

## 2012-02-27 DIAGNOSIS — Z9849 Cataract extraction status, unspecified eye: Secondary | ICD-10-CM | POA: Insufficient documentation

## 2012-02-27 DIAGNOSIS — I10 Essential (primary) hypertension: Secondary | ICD-10-CM | POA: Insufficient documentation

## 2012-02-27 DIAGNOSIS — Z862 Personal history of diseases of the blood and blood-forming organs and certain disorders involving the immune mechanism: Secondary | ICD-10-CM | POA: Insufficient documentation

## 2012-02-27 DIAGNOSIS — R300 Dysuria: Secondary | ICD-10-CM

## 2012-02-27 DIAGNOSIS — Z8739 Personal history of other diseases of the musculoskeletal system and connective tissue: Secondary | ICD-10-CM | POA: Insufficient documentation

## 2012-02-27 DIAGNOSIS — E785 Hyperlipidemia, unspecified: Secondary | ICD-10-CM | POA: Insufficient documentation

## 2012-02-27 DIAGNOSIS — Z8781 Personal history of (healed) traumatic fracture: Secondary | ICD-10-CM

## 2012-02-27 DIAGNOSIS — Z7982 Long term (current) use of aspirin: Secondary | ICD-10-CM | POA: Insufficient documentation

## 2012-02-27 DIAGNOSIS — E039 Hypothyroidism, unspecified: Secondary | ICD-10-CM | POA: Insufficient documentation

## 2012-02-27 LAB — CBC WITH DIFFERENTIAL/PLATELET
Basophils Absolute: 0 10*3/uL (ref 0.0–0.1)
Basophils Relative: 0 % (ref 0–1)
Eosinophils Absolute: 0.1 10*3/uL (ref 0.0–0.7)
Eosinophils Relative: 2 % (ref 0–5)
HCT: 41 % (ref 36.0–46.0)
Hemoglobin: 14.2 g/dL (ref 12.0–15.0)
MCH: 33.3 pg (ref 26.0–34.0)
MCHC: 34.6 g/dL (ref 30.0–36.0)
MCV: 96.2 fL (ref 78.0–100.0)
Monocytes Absolute: 0.5 10*3/uL (ref 0.1–1.0)
Monocytes Relative: 11 % (ref 3–12)
Neutro Abs: 3.5 10*3/uL (ref 1.7–7.7)
RDW: 16 % — ABNORMAL HIGH (ref 11.5–15.5)

## 2012-02-27 LAB — URINALYSIS, ROUTINE W REFLEX MICROSCOPIC
Glucose, UA: NEGATIVE mg/dL
Ketones, ur: 40 mg/dL — AB
Protein, ur: NEGATIVE mg/dL

## 2012-02-27 LAB — BASIC METABOLIC PANEL
BUN: 16 mg/dL (ref 6–23)
Calcium: 9.2 mg/dL (ref 8.4–10.5)
Creatinine, Ser: 1.19 mg/dL — ABNORMAL HIGH (ref 0.50–1.10)
GFR calc Af Amer: 56 mL/min — ABNORMAL LOW (ref >90)
GFR calc non Af Amer: 48 mL/min — ABNORMAL LOW (ref >90)

## 2012-02-27 LAB — URINE MICROSCOPIC-ADD ON

## 2012-02-27 MED ORDER — MORPHINE SULFATE 4 MG/ML IJ SOLN
4.0000 mg | Freq: Once | INTRAMUSCULAR | Status: AC
  Administered 2012-02-27: 4 mg via INTRAVENOUS
  Filled 2012-02-27: qty 1

## 2012-02-27 MED ORDER — OXYCODONE-ACETAMINOPHEN 5-325 MG PO TABS
2.0000 | ORAL_TABLET | Freq: Once | ORAL | Status: AC
  Administered 2012-02-27: 2 via ORAL
  Filled 2012-02-27: qty 2

## 2012-02-27 MED ORDER — HYDROMORPHONE HCL PF 1 MG/ML IJ SOLN
1.0000 mg | Freq: Once | INTRAMUSCULAR | Status: AC
  Administered 2012-02-27: 1 mg via INTRAVENOUS
  Filled 2012-02-27: qty 1

## 2012-02-27 MED ORDER — SODIUM CHLORIDE 0.9 % IV BOLUS (SEPSIS)
1000.0000 mL | Freq: Once | INTRAVENOUS | Status: AC
  Administered 2012-02-27: 1000 mL via INTRAVENOUS

## 2012-02-27 MED ORDER — ONDANSETRON HCL 4 MG/2ML IJ SOLN
4.0000 mg | Freq: Once | INTRAMUSCULAR | Status: AC
  Administered 2012-02-27: 4 mg via INTRAVENOUS
  Filled 2012-02-27: qty 2

## 2012-02-27 MED ORDER — DIAZEPAM 5 MG PO TABS
5.0000 mg | ORAL_TABLET | Freq: Once | ORAL | Status: AC
  Administered 2012-02-27: 5 mg via ORAL
  Filled 2012-02-27: qty 1

## 2012-02-27 MED ORDER — ONDANSETRON 4 MG PO TBDP
4.0000 mg | ORAL_TABLET | Freq: Once | ORAL | Status: AC
  Administered 2012-02-27: 4 mg via ORAL
  Filled 2012-02-27: qty 1

## 2012-02-27 NOTE — ED Notes (Signed)
Patient is resting comfortably. 

## 2012-02-27 NOTE — ED Notes (Signed)
Patient did request some crackers and peanut butter and it was given to her per [place], PA.

## 2012-02-27 NOTE — ED Notes (Signed)
Unable to ambulate pt d/t pts back pain.

## 2012-02-27 NOTE — Telephone Encounter (Signed)
Patient Information:  Caller Name: [name]  Phone: [phone]  Patient: [name], [name]  Gender: Female  DOB: 1951-01-14  Age: 61 Years  PCP: Sanda Linger (Adults only)   Symptoms  Reason For Call & Symptoms: Patient states she has appt with Dr. Yetta Barre today at 11:00 today. She is complaining of severe back pain and nausea. She does not think she can walk to car. Pain 9/10.  Unable to walk, stand and now cannot walk. She feels "cold and clammy. Located- Lower right flank.  She states onset of pain on Tuesday  02/21/12 and went to the ER.  She has noticed "dark spots in her urine" ? blood.  Reviewed Health History In EMR: Yes  Reviewed Medications In EMR: Yes  Reviewed Allergies In EMR: Yes  Reviewed Surgeries / Procedures: No  Date of Onset of Symptoms: 02/21/2012  Treatments Tried: vicodin  Treatments Tried Worked: No  Guideline(s) Used:  Back Pain  Disposition Per Guideline:   Call EMS 911 Now  Reason For Disposition Reached:   Shock suspected (e.g., cold/pale/clammy skin, too weak to stand)  Advice Given:  N/A  Office Follow Up:  Does the office need to follow up with this patient?: Yes  Instructions For The Office: Patient unable to walk. Cannot get to her car. Holding on to wall to stablize. Complains of weakness cold and clammy. Directed to ER. /911  RN Note:  Patient states she has an appt.at 11:00. Unable to walk or get to car. Cannot drive.   Feels weak, cold and clammy. Advised ER now. Will cancel morning appt.

## 2012-02-27 NOTE — ED Provider Notes (Signed)
History     CSN: [id number]  Arrival date & time 02/27/12  1109   First MD Initiated Contact with Patient 02/27/12 1128      Chief Complaint  Patient presents with  . Back Pain  . Dysuria    (Consider location/radiation/quality/duration/timing/severity/associated sxs/prior treatment) Patient is a 61 y.o. female presenting with back pain and dysuria. The history is provided by the patient and medical records.  Back Pain  Associated symptoms include dysuria. Pertinent negatives include no chest pain, no fever, no headaches and no abdominal pain.  Dysuria  Associated symptoms include nausea and flank pain. Pertinent negatives include no vomiting, no frequency, no hematuria and no urgency.    [name] is a 61 y.o. female  with a hx of compression fracture presents to the Emergency Department complaining of gradual, persistent, progressively worsening right sided back pin onset 1 week ago.  She was seen here on 02/22/12 and diagnosed with MSK pain and given Vicodin.  Urine culture was sent at that time.  Pt with a hx of f/x in the same place.  X-rays  On 12/4 are unremarkable and with fracture.  Associated symptoms include back pain, malodorous, dark colored urine, decreased urine, subjective fever, constipation, nausea.  lying still makes it better and movement makes it worse.  Pt denies headache, chest pain, shortness of breath, abdominal pain, vomiting, diarrhea, weakness, dizziness, syncope.  Last BM 3 days ago.     Past Medical History  Diagnosis Date  . Spinal stenosis   . Numbness of foot     left foot  . Diverticulitis     Status post partial colectomy 1/12 with reversal in July 2012  . Colitis, ischemic   . DJD (degenerative joint disease)     History of multiple surgeries to the back, shoulder and knee  . Ischemic cardiomyopathy     Echo 06/16/10: EF 25-30%, anteroseptal and apical hypokinesis, moderate AI, mild MR  . Chronic systolic heart failure   . CAD (coronary  artery disease)     a.  Ant MI 8/03 with stenting of the LAD;  b. staged PCI with Cypher DES to OM1 8/03;  c. s/p Cypher DES to LAD 7/04;  d.  multiple cardiac caths in past (chronically abnl ECG);    e. cardiac catheterization 3/12: LAD stent patent, distal LAD 40-50%, small ostial D1 80%, ostial D2 60%, OM-1 stent patent (20-30%), proximal RCA 50%, proximal to mid RCA 40-50%; f. cath 02/17/2011 - nonobs  . LV (left ventricular) mural thrombus     Chronic Coumadin therapy  . CKD (chronic kidney disease), stage III     creatinine:  1.3 in 4/12;    . Tobacco abuse     history  . GERD (gastroesophageal reflux disease)   . Hypertension   . Lower GI bleed     August 2011  . Hypothyroidism   . Aortic insufficiency   . Pseudoaneurysm     History of, right forearm  . Hyperlipidemia   . OA (osteoarthritis)   . Abnormal EKG     Chronically abnormal EKG.   . Spinal stenosis   . Numbness of foot     Left foot  . Compression fracture 07/04/11    L2  . Anterior myocardial infarction 10/2001  . SVT (supraventricular tachycardia)     ? h/o AFib;  patient on long term amiodarone therapy  . PAF (paroxysmal atrial fibrillation)   . Anatomical narrow angle of right eye   .  Heart murmur     "age 16-19"  . Shortness of breath     "at times; related to CHF"  . Shortness of breath on exertion   . 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 Graciela Husbands.   [name] AICD (automatic cardioverter/defibrillator) present 10/2004    explant; implant  . Blood transfusion   . Anemia   . H/O hiatal hernia   . Stroke     History of TIA and possible CVA; hospitalized 2004; on coumadin  . Systolic heart failure     Past Surgical History  Procedure Date  . Back surgery   . Coronary angioplasty with stent placement 10/2001; 09/2002  . Cardiac defibrillator placement 07/2003; 10/2004  . Lumbar laminectomy/decompression microdiscectomy 10/2001    L5-S1/E-chart  . Knee arthroscopy     left  .  Thyroidectomy 1970's  . Total knee arthroplasty 11/2003    left  . X-stop implantation 12/2004    L3-4; L4-5  . Fixation kyphoplasty thoracic spine 07/2007    T3, 4, 6 compression fractures  . Shoulder open rotator cuff repair 04/2008    right  . Lumbar laminectomy/decompression microdiscectomy 01/2010  . Colectomy 03/2010    sigmoid left  . Colostomy 03/2010    transverse  . Peripherally inserted central catheter insertion 03/2010    removed upon discharge  . Colostomy closure 12/2010    reversal  . Tonsillectomy and adenoidectomy 1969  . Appendectomy 03/2010  . Cataract extraction     right    Family History  Problem Relation Age of Onset  . Diabetes Mother   . Diabetes Brother   . Arthritis      family history  . Prostate cancer      Family History    History  Substance Use Topics  . Smoking status: Former Smoker -- 0.1 packs/day for 33 years    Types: Cigarettes    Quit date: 04/21/2001  . Smokeless tobacco: Never Used     Comment: "smoked ~ 1 pack/month"  . Alcohol Use: No     Comment: 07/05/11 "occasionally; last time was glass of wine last week"    OB History    Grav Para Term Preterm Abortions TAB SAB Ect Mult Living                  Review of Systems  Constitutional: Negative for fever, diaphoresis, appetite change, fatigue and unexpected weight change.  HENT: Negative for mouth sores, neck pain and neck stiffness.   Eyes: Negative for visual disturbance.  Respiratory: Negative for cough, chest tightness, shortness of breath and wheezing.   Cardiovascular: Negative for chest pain.  Gastrointestinal: Positive for nausea. Negative for vomiting, abdominal pain, diarrhea and constipation.  Genitourinary: Positive for dysuria, flank pain and decreased urine volume. Negative for urgency, frequency and hematuria.  Musculoskeletal: Positive for back pain.  Skin: Negative for rash.  Neurological: Negative for syncope, light-headedness and headaches.    Hematological: Does not bruise/bleed easily.  Psychiatric/Behavioral: Negative for sleep disturbance. The patient is not nervous/anxious.   All other systems reviewed and are negative.    Allergies  Lansoprazole; Penicillins; and Statins  Home Medications   Current Outpatient Rx  Name  Route  Sig  Dispense  Refill  . AMIODARONE HCL 200 MG PO TABS   Oral   Take 200 mg by mouth daily.         . ASPIRIN EC 81 MG PO TBEC   Oral  Take 81 mg by mouth daily.         . BUSPIRONE HCL 15 MG PO TABS   Oral   Take 15 mg by mouth daily as needed. For mood stabilization.         [name] CALCIUM CARBONATE-VITAMIN D 500-200 MG-UNIT PO TABS   Oral   Take 1 tablet by mouth 2 (two) times daily with a meal.   60 tablet   1   . CARVEDILOL 12.5 MG PO TABS   Oral   Take 1 tablet (12.5 mg total) by mouth 2 (two) times daily with a meal.   60 tablet   6   . CYCLOBENZAPRINE HCL 5 MG PO TABS   Oral   Take 5 mg by mouth 2 (two) times daily as needed. For muscle spasms.         [name] EZETIMIBE 10 MG PO TABS   Oral   Take 10 mg by mouth at bedtime.          [name] FEXOFENADINE HCL 180 MG PO TABS   Oral   Take 180 mg by mouth daily.          . OMEGA-3 FATTY ACIDS 1000 MG PO CAPS   Oral   Take 1,000 mg by mouth daily.           [name] FLUTICASONE PROPIONATE 50 MCG/ACT NA SUSP   Nasal   Place 2 sprays into the nose daily as needed. For allergies         . FA-PYRIDOXINE-CYANCOBALAMIN 2.5-25-2 MG PO TABS   Oral   Take 1 tablet by mouth daily.           . FUROSEMIDE 20 MG PO TABS   Oral   Take 1 tablet (20 mg total) by mouth daily.   30 tablet   1   . HYDROCODONE-ACETAMINOPHEN 5-325 MG PO TABS   Oral   Take 1 tablet by mouth every 6 (six) hours as needed for pain.   12 tablet   0   . LEVOTHYROXINE SODIUM 200 MCG PO TABS   Oral   Take 1 tablet (200 mcg total) by mouth daily.   30 tablet   6   . LISINOPRIL 5 MG PO TABS   Oral   Take 2.5 mg by mouth 2 (two) times daily.          [name] METHOCARBAMOL 500 MG PO TABS   Oral   Take 500 mg by mouth 4 (four) times daily as needed. For spasms          . NITROGLYCERIN 0.4 MG SL SUBL   Sublingual   Place 0.4 mg under the tongue every 5 (five) minutes as needed. For chest pain         . PANTOPRAZOLE SODIUM 40 MG PO TBEC   Oral   Take 40 mg by mouth 2 (two) times daily.          [name] POLYETHYLENE GLYCOL 3350 PO PACK   Oral   Take 17 g by mouth daily as needed. For constipation         . POTASSIUM CHLORIDE CRYS ER 20 MEQ PO TBCR   Oral   Take 20 mEq by mouth daily.         [name] PREGABALIN 50 MG PO CAPS   Oral   Take 1 capsule (50 mg total) by mouth 3 (three) times daily.   90 capsule   5   . RALOXIFENE HCL 60  MG PO TABS   Oral   Take 60 mg by mouth daily.         . WARFARIN SODIUM 3 MG PO TABS   Oral   Take 3 mg by mouth at bedtime.            BP 101/63  Pulse 50  Temp 97.9 F (36.6 C) (Oral)  Resp 14  SpO2 99%  Physical Exam  Nursing note and vitals reviewed. Constitutional: She is oriented to person, place, and time. She appears well-developed and well-nourished. No distress.  HENT:  Head: Normocephalic and atraumatic.  Mouth/Throat: Oropharynx is clear and moist. No oropharyngeal exudate.  Eyes: Conjunctivae normal are normal. Pupils are equal, round, and reactive to light. No scleral icterus.  Neck: Normal range of motion. Neck supple.  Cardiovascular: Normal rate, regular rhythm, normal heart sounds and intact distal pulses.  Exam reveals no gallop and no friction rub.   No murmur heard. Pulmonary/Chest: Effort normal and breath sounds normal. No respiratory distress. She has no wheezes. She has no rales. She exhibits no tenderness.  Abdominal: Soft. Normal appearance and bowel sounds are normal. She exhibits no distension and no mass. There is no hepatosplenomegaly. There is tenderness (very mild throughout). There is CVA tenderness (R). There is no rigidity, no rebound, no  guarding, no tenderness at McBurney's point and negative Murphy's sign.  Musculoskeletal: She exhibits no edema and no tenderness.       Thoracic back: She exhibits decreased range of motion, tenderness, pain and spasm. She exhibits no swelling, no edema, no deformity, no laceration and normal pulse.       Lumbar back: She exhibits decreased range of motion, tenderness and pain. She exhibits no bony tenderness, no swelling, no edema, no deformity, no laceration, no spasm and normal pulse.       Arms:      Pain to palpation of the right paraspinal muscles in the T-spine and upper L-spine area  Lymphadenopathy:    She has no cervical adenopathy.  Neurological: She is alert and oriented to person, place, and time. She exhibits normal muscle tone. Coordination normal.       Speech is clear and goal oriented Moves extremities without ataxia  Skin: Skin is warm and dry. No rash noted. She is not diaphoretic. No erythema.  Psychiatric: She has a normal mood and affect.    ED Course  Procedures (including critical care time)  Labs Reviewed  URINALYSIS, ROUTINE W REFLEX MICROSCOPIC - Abnormal; Notable for the following:    Color, Urine AMBER (*)  BIOCHEMICALS MAY BE AFFECTED BY COLOR   Specific Gravity, Urine 1.031 (*)     Bilirubin Urine MODERATE (*)     Ketones, ur 40 (*)     Leukocytes, UA SMALL (*)     All other components within normal limits  CBC WITH DIFFERENTIAL - Abnormal; Notable for the following:    RDW 16.0 (*)     Platelets 136 (*)     All other components within normal limits  BASIC METABOLIC PANEL - Abnormal; Notable for the following:    Creatinine, Ser 1.19 (*)     GFR calc non Af Amer 48 (*)     GFR calc Af Amer 56 (*)     All other components within normal limits  URINE MICROSCOPIC-ADD ON  URINE CULTURE   No results found.   Results for orders placed during the hospital encounter of 02/27/12  URINALYSIS, ROUTINE W REFLEX MICROSCOPIC  Component Value Range    Color, Urine AMBER (*) YELLOW   APPearance CLEAR  CLEAR   Specific Gravity, Urine 1.031 (*) 1.005 - 1.030   pH 5.5  5.0 - 8.0   Glucose, UA NEGATIVE  NEGATIVE mg/dL   Hgb urine dipstick NEGATIVE  NEGATIVE   Bilirubin Urine MODERATE (*) NEGATIVE   Ketones, ur 40 (*) NEGATIVE mg/dL   Protein, ur NEGATIVE  NEGATIVE mg/dL   Urobilinogen, UA 1.0  0.0 - 1.0 mg/dL   Nitrite NEGATIVE  NEGATIVE   Leukocytes, UA SMALL (*) NEGATIVE  CBC WITH DIFFERENTIAL      Component Value Range   WBC 5.1  4.0 - 10.5 K/uL   RBC 4.26  3.87 - 5.11 MIL/uL   Hemoglobin 14.2  12.0 - 15.0 g/dL   HCT 16.1  09.6 - 04.5 %   MCV 96.2  78.0 - 100.0 fL   MCH 33.3  26.0 - 34.0 pg   MCHC 34.6  30.0 - 36.0 g/dL   RDW 40.9 (*) 81.1 - 91.4 %   Platelets 136 (*) 150 - 400 K/uL   Neutrophils Relative 69  43 - 77 %   Neutro Abs 3.5  1.7 - 7.7 K/uL   Lymphocytes Relative 18  12 - 46 %   Lymphs Abs 0.9  0.7 - 4.0 K/uL   Monocytes Relative 11  3 - 12 %   Monocytes Absolute 0.5  0.1 - 1.0 K/uL   Eosinophils Relative 2  0 - 5 %   Eosinophils Absolute 0.1  0.0 - 0.7 K/uL   Basophils Relative 0  0 - 1 %   Basophils Absolute 0.0  0.0 - 0.1 K/uL  BASIC METABOLIC PANEL      Component Value Range   Sodium 135  135 - 145 mEq/L   Potassium 4.4  3.5 - 5.1 mEq/L   Chloride 98  96 - 112 mEq/L   CO2 23  19 - 32 mEq/L   Glucose, Bld 99  70 - 99 mg/dL   BUN 16  6 - 23 mg/dL   Creatinine, Ser 7.82 (*) 0.50 - 1.10 mg/dL   Calcium 9.2  8.4 - 95.6 mg/dL   GFR calc non Af Amer 48 (*) >90 mL/min   GFR calc Af Amer 56 (*) >90 mL/min  URINE MICROSCOPIC-ADD ON      Component Value Range   Squamous Epithelial / LPF RARE  RARE   WBC, UA 3-6  <3 WBC/hpf   RBC / HPF 0-2  <3 RBC/hpf   Bacteria, UA RARE  RARE   Dg Thoracic Spine 2 View  02/21/2012  *RADIOLOGY REPORT*  Clinical Data: Lower right thoracic and upper right lumbar pain for 2 days.  No known injury.  THORACIC SPINE - 2 VIEW  Comparison: Prior chest radiographs ranging from  06/17/2010 through 09/14/2011.  Findings: There are stable findings at T3, T4 and T6 status post spinal augmentation. An old fracture involving the superior endplate of T9 is stable.  There is no evidence of acute fracture or paraspinal hematoma.  Left subclavian AICD and coronary artery stents are noted.  IMPRESSION: Stable thoracic compression deformities and post spinal augmentation findings.  No evidence of acute fracture.   Original Report Authenticated By: Carey Bullocks, M.D.    Dg Lumbar Spine 2-3 Views  02/21/2012  *RADIOLOGY REPORT*  Clinical Data: Lower right thoracic and upper right lumbar pain for 2 days.  No known injury.  LUMBAR SPINE - 2-3 VIEW  Comparison: 01/27/2012 radiographs.  Findings: Patient is status post L3-S1 fusion with bilateral pedicle screws, interconnecting rods and interbody spacers.  The hardware appears unchanged.  There appears to be some chronic loosening of the left S1 pedicle screw.  A superior endplate compression deformity at L2 is unchanged, resulting in approximately 50% loss of vertebral body height.  No acute fractures are identified.  There is a stable anterolisthesis at L5- S1.  IMPRESSION: Stable appearance of the lumbar spine status post L3-S1 fusion. Chronic L2 compression deformity appears unchanged.   Original Report Authenticated By: Carey Bullocks, M.D.       1. Dysuria   2. Back pain   3. H/O compression fracture of spine   4. Constipation       MDM  [name] Sample patient presents back pain and persistent dysuria.  Urinalysis largely unchanged from that on 02/22/2012. No urine culture report found. Since the patient continues to have symptoms will treat with abx upon discharge. Pt with significant improvement in back pain after morphine and valium administration, but she is still unable to walk.  I have discussed the patient with Wynetta Emery, PA-C. Will move the patient to the CDU for further pain control.  If pain is controlled and  patient is able to ambulate safely she may be discharged home.         Dahlia Client Aeisha Minarik, PA-C 02/27/12 1809

## 2012-02-27 NOTE — ED Notes (Signed)
Per ems- pt c/o lower back pain, right flank pain. Pt recently dx with uti, does not have antibiotics. Pt was supposed to follow up with PCP but pt pain was too much. HR- 60 BP-130/80 R-16

## 2012-02-27 NOTE — ED Notes (Signed)
Pt placed on bedpan and will notify staff with call light when ready

## 2012-02-27 NOTE — ED Provider Notes (Signed)
[name] is a 61 y.o. female transferred to CDU from Pod A.. Signout from Georgia Muthersbaugh as follows: low back and right flank pain. His remote history of compression fracture with multiple spinal fusions. Plan as per PA Muthersbaugh is to give cipro to treat her dysuria, although UA is not consistent with infection. Plan is to control pain and discharge her to home with with stool softener advise her to continue with Vicodin and consider adding on a muscle relaxer.  Pt seen and examined at the bedside she is very uncomfortable and states the pain is 9/10 at movement. As per patient she had an appointment with her PCP today and the pain significantly worsened to the point that she is unable to ambulate. She was advised to present to the ED by her PCP. Heart is a regular rate and rhythm with no murmurs rubs or gallops, lung sounds are clear to auscultation bilaterally, significant right upper quadrant tenderness to palpation with no rebound or peritoneal signs.   Patient's significant right upper quadrant pain I will order a chest x-ray and abdominal ultrasound.  10:35 PM patient reexamined after imaging. She states that the pain has returned secondary to the movement required for the imaging studies. I will give her a milligram of Dilaudid and Zofran with fluid bolus and attempt ambulation  11:50 PM patient de-satted to the 80s with sensation of dizziness and lightheadedness while RN was in the room. Patient also vomited before administration of the last pain meds. I am going to order a LFTs, lactate and lipase.  Discussed case with attending Dr. Read Drivers who came to evaluate the patient, he agrees a abdominal CT is warranted. Patient will be transferred to pod A. for closer monitoring.   Wynetta Emery, PA-C 02/28/12 0017  Discussed use of contrast with patient's creatinine of 1.19 and GFR of around 50. As per CT tech she is eligible. Order will be laced.  Wynetta Emery, PA-C 02/28/12  0022

## 2012-02-28 ENCOUNTER — Emergency Department (HOSPITAL_COMMUNITY): Payer: Medicare Other

## 2012-02-28 LAB — HEPATIC FUNCTION PANEL
Albumin: 3.3 g/dL — ABNORMAL LOW (ref 3.5–5.2)
Alkaline Phosphatase: 47 U/L (ref 39–117)
Bilirubin, Direct: 0.1 mg/dL (ref 0.0–0.3)
Total Bilirubin: 0.5 mg/dL (ref 0.3–1.2)

## 2012-02-28 LAB — LIPASE, BLOOD: Lipase: 26 U/L (ref 11–59)

## 2012-02-28 MED ORDER — ONDANSETRON HCL 4 MG/2ML IJ SOLN
INTRAMUSCULAR | Status: AC
  Filled 2012-02-28: qty 2

## 2012-02-28 MED ORDER — HYDROCODONE-ACETAMINOPHEN 5-325 MG PO TABS: 1.0000 | ORAL_TABLET | Freq: Four times a day (QID) | ORAL | Status: DC | PRN

## 2012-02-28 MED ORDER — ONDANSETRON HCL 4 MG/2ML IJ SOLN
4.0000 mg | Freq: Once | INTRAMUSCULAR | Status: AC
  Administered 2012-02-28: 4 mg via INTRAVENOUS

## 2012-02-28 MED ORDER — IOHEXOL 300 MG/ML  SOLN
80.0000 mL | Freq: Once | INTRAMUSCULAR | Status: AC | PRN
  Administered 2012-02-28: 80 mL via INTRAVENOUS

## 2012-02-28 NOTE — ED Notes (Signed)
Pt vomited x 1. MD made aware. CT called and will come to get pt. Pt drank approximately 1.5 cups before vomiting.

## 2012-02-28 NOTE — ED Notes (Signed)
Patient transported to CT 

## 2012-02-28 NOTE — ED Provider Notes (Signed)
EKG Interpretation:  Date & Time: 02/28/2012 00:30  Rate: 65  Rhythm: normal sinus rhythm  QRS Axis: normal  Intervals: normal  ST/T Wave abnormalities: nonspecific T wave changes  Conduction Disutrbances:nonspecific intraventricular conduction delay  Narrative Interpretation: Poor R. wave progression; left ventricular hypertrophy  Old EKG Reviewed: Flattening of lateral T waves  Nursing notes and vitals signs, including pulse oximetry, reviewed.  Summary of this visit's results, reviewed by myself:  Labs:  Results for orders placed during the hospital encounter of 02/27/12 (from the past 24 hour(s))  CBC WITH DIFFERENTIAL     Status: Abnormal   Collection Time   02/27/12 11:45 AM      Component Value Range   WBC 5.1  4.0 - 10.5 K/uL   RBC 4.26  3.87 - 5.11 MIL/uL   Hemoglobin 14.2  12.0 - 15.0 g/dL   HCT 40.9  81.1 - 91.4 %   MCV 96.2  78.0 - 100.0 fL   MCH 33.3  26.0 - 34.0 pg   MCHC 34.6  30.0 - 36.0 g/dL   RDW 78.2 (*) 95.6 - 21.3 %   Platelets 136 (*) 150 - 400 K/uL   Neutrophils Relative 69  43 - 77 %   Neutro Abs 3.5  1.7 - 7.7 K/uL   Lymphocytes Relative 18  12 - 46 %   Lymphs Abs 0.9  0.7 - 4.0 K/uL   Monocytes Relative 11  3 - 12 %   Monocytes Absolute 0.5  0.1 - 1.0 K/uL   Eosinophils Relative 2  0 - 5 %   Eosinophils Absolute 0.1  0.0 - 0.7 K/uL   Basophils Relative 0  0 - 1 %   Basophils Absolute 0.0  0.0 - 0.1 K/uL  BASIC METABOLIC PANEL     Status: Abnormal   Collection Time   02/27/12 11:45 AM      Component Value Range   Sodium 135  135 - 145 mEq/L   Potassium 4.4  3.5 - 5.1 mEq/L   Chloride 98  96 - 112 mEq/L   CO2 23  19 - 32 mEq/L   Glucose, Bld 99  70 - 99 mg/dL   BUN 16  6 - 23 mg/dL   Creatinine, Ser 0.86 (*) 0.50 - 1.10 mg/dL   Calcium 9.2  8.4 - 57.8 mg/dL   GFR calc non Af Amer 48 (*) >90 mL/min   GFR calc Af Amer 56 (*) >90 mL/min  URINALYSIS, ROUTINE W REFLEX MICROSCOPIC     Status: Abnormal   Collection Time   02/27/12 12:36 PM       Component Value Range   Color, Urine AMBER (*) YELLOW   APPearance CLEAR  CLEAR   Specific Gravity, Urine 1.031 (*) 1.005 - 1.030   pH 5.5  5.0 - 8.0   Glucose, UA NEGATIVE  NEGATIVE mg/dL   Hgb urine dipstick NEGATIVE  NEGATIVE   Bilirubin Urine MODERATE (*) NEGATIVE   Ketones, ur 40 (*) NEGATIVE mg/dL   Protein, ur NEGATIVE  NEGATIVE mg/dL   Urobilinogen, UA 1.0  0.0 - 1.0 mg/dL   Nitrite NEGATIVE  NEGATIVE   Leukocytes, UA SMALL (*) NEGATIVE  URINE MICROSCOPIC-ADD ON     Status: Normal   Collection Time   02/27/12 12:36 PM      Component Value Range   Squamous Epithelial / LPF RARE  RARE   WBC, UA 3-6  <3 WBC/hpf   RBC / HPF 0-2  <3 RBC/hpf  Bacteria, UA RARE  RARE  LIPASE, BLOOD     Status: Normal   Collection Time   02/27/12 11:56 PM      Component Value Range   Lipase 26  11 - 59 U/L  LACTIC ACID, PLASMA     Status: Normal   Collection Time   02/27/12 11:56 PM      Component Value Range   Lactic Acid, Venous 0.8  0.5 - 2.2 mmol/L  HEPATIC FUNCTION PANEL     Status: Abnormal   Collection Time   02/27/12 11:56 PM      Component Value Range   Total Protein 6.6  6.0 - 8.3 g/dL   Albumin 3.3 (*) 3.5 - 5.2 g/dL   AST 60 (*) 0 - 37 U/L   ALT 37 (*) 0 - 35 U/L   Alkaline Phosphatase 47  39 - 117 U/L   Total Bilirubin 0.5  0.3 - 1.2 mg/dL   Bilirubin, Direct 0.1  0.0 - 0.3 mg/dL   Indirect Bilirubin 0.4  0.3 - 0.9 mg/dL    Imaging Studies: Dg Chest 2 View  02/27/2012  *RADIOLOGY REPORT*  Clinical Data: The upper back pain.  Vomiting.  Dysuria.  Chest pain.  CHEST - 2 VIEW  Comparison: 09/14/2011  Findings: Shallow inspiration.  Stable appearance of cardiac pacemaker.  Borderline heart size with normal pulmonary vascularity.  Linear fibrosis or atelectasis in the right lung base.  No focal airspace consolidation.  No blunting of costophrenic angles.  No pneumothorax.  Coronary artery stents. Post kyphoplasty changes in the thoracic spine.  IMPRESSION: No evidence of active  pulmonary disease.   Original Report Authenticated By: Burman Nieves, M.D.    US Abdomen Complete  02/27/2012  *RADIOLOGY REPORT*  Clinical Data:  Pain.  History of fatty liver.  Nausea and vomiting.  COMPLETE ABDOMINAL ULTRASOUND  Comparison:  CT abdomen and pelvis 03/20/2010  Findings:  Gallbladder:  There is layering sludge in the gallbladder.  No discrete stones are visualized.  No gallbladder wall thickening or pericholecystic edema.  Murphy's sign is negative.  Common bile duct:  Normal caliber with measured diameter of 6.9 mm.  Liver:  Diffusely increased hepatic parenchymal echotexture consistent with diffuse fatty infiltration.  No focal lesions are appreciated.  IVC:  Appears normal.  Pancreas:  Incomplete visualization of the pancreas due to overlying bowel gas.  Spleen:  Spleen length measures 5.5 cm.  Normal parenchymal echotexture.  Right Kidney:  Right kidney measures 8.7 cm length.  Diffuse parenchymal atrophy.  No hydronephrosis.  Left Kidney:  Left kidney measures 7.2 cm length.  Diffuse parenchymal atrophy.  No hydronephrosis.  Abdominal aorta:  No aneurysm identified.  IMPRESSION: Sludge in the gallbladder without visualized stone or wall thickening.  Diffuse fatty infiltration of the liver.  Bilateral renal parenchymal atrophy suggesting chronic medical renal disease.   Original Report Authenticated By: Burman Nieves, M.D.    Ct Abdomen Pelvis W Contrast  02/28/2012  *RADIOLOGY REPORT*  Clinical Data: Right flank pain and dysuria today.  Right upper quadrant pain.  Back pain.  CT ABDOMEN AND PELVIS WITH CONTRAST  Technique:  Multidetector CT imaging of the abdomen and pelvis was performed following the standard protocol during bolus administration of intravenous contrast.  Contrast: 80mL OMNIPAQUE IOHEXOL 300 MG/ML  SOLN  Comparison: 03/20/2010  Findings: Atelectasis in the lung bases.  The liver, spleen, gallbladder, pancreas, adrenal glands, kidneys, and retroperitoneal lymph nodes  are unremarkable.  Calcification of the abdominal aorta and  branch vessels without aneurysm.  The stomach, small bowel, and colon are not abnormally distended.  No significant wall thickening.  No free air or free fluid in the abdomen.  Pelvis:  Uterus and adnexal structures are not enlarged.  The bladder is decompressed.  No free or loculated pelvic fluid collections.  The appendix is probably surgically absent.  No evidence of diverticulitis.  There has probably been partial resection of the colon.  No significant pelvic lymphadenopathy. Extensive degenerative and postoperative changes in the lumbar spine. Superior endplate compression of L1 and anterior compression of L2 have developed since the previous CT study but were present on prior plain films from 02/21/2012.  IMPRESSION: No acute process demonstrated in the abdomen or pelvis.   Original Report Authenticated By: Burman Nieves, M.D.    7:27 AM Patient states she is feeling better. She has not required any additional intervention overnight. She was advised of her negative CT scan apart from known compression fractures. I suspect her abdominal pain may be related to her compression fractures. She requests a prescription for hydrocodone. She is comfortable going home at this time. Her abdomen is soft, less tender than on earlier examination.  Medical screening examination/treatment/procedure(s) were conducted as a shared visit with non-physician practitioner(s) and myself.  I personally evaluated the patient during the encounter     Hanley Seamen, MD 02/28/12 [phone]

## 2012-02-28 NOTE — ED Provider Notes (Signed)
Medical screening examination/treatment/procedure(s) were performed by non-physician practitioner and as supervising physician I was immediately available for consultation/collaboration.   Xyler Terpening E Alecxis Baltzell, MD 02/28/12 1103 

## 2012-02-28 NOTE — ED Provider Notes (Signed)
Medical screening examination/treatment/procedure(s) were performed by non-physician practitioner and as supervising physician I was immediately available for consultation/collaboration.  Candee Hoon L Darothy Courtright, MD 02/28/12 1525 

## 2012-02-28 NOTE — ED Notes (Signed)
Pt drank one cup of contrast. CT Scan called and updated on status.

## 2012-02-29 LAB — URINE CULTURE: Colony Count: 35000

## 2012-03-01 ENCOUNTER — Other Ambulatory Visit: Payer: Self-pay | Admitting: Cardiology

## 2012-03-01 NOTE — ED Notes (Signed)
+   Urine Chart sent to EDP office for review. 

## 2012-03-02 ENCOUNTER — Encounter (HOSPITAL_COMMUNITY): Payer: Self-pay | Admitting: Cardiology

## 2012-03-02 ENCOUNTER — Inpatient Hospital Stay (HOSPITAL_COMMUNITY)
Admission: EM | Admit: 2012-03-02 | Discharge: 2012-03-06 | DRG: 309 | Disposition: A | Discharge status: Home Health | Payer: Medicare Other | Attending: Interventional Cardiology | Admitting: Interventional Cardiology

## 2012-03-02 DIAGNOSIS — Z96659 Presence of unspecified artificial knee joint: Secondary | ICD-10-CM

## 2012-03-02 DIAGNOSIS — Z4502 Encounter for adjustment and management of automatic implantable cardiac defibrillator: Secondary | ICD-10-CM

## 2012-03-02 DIAGNOSIS — R0600 Dyspnea, unspecified: Secondary | ICD-10-CM

## 2012-03-02 DIAGNOSIS — I509 Heart failure, unspecified: Secondary | ICD-10-CM | POA: Diagnosis present

## 2012-03-02 DIAGNOSIS — Z9861 Coronary angioplasty status: Secondary | ICD-10-CM

## 2012-03-02 DIAGNOSIS — M4800 Spinal stenosis, site unspecified: Secondary | ICD-10-CM

## 2012-03-02 DIAGNOSIS — K219 Gastro-esophageal reflux disease without esophagitis: Secondary | ICD-10-CM | POA: Diagnosis present

## 2012-03-02 DIAGNOSIS — Z7901 Long term (current) use of anticoagulants: Secondary | ICD-10-CM

## 2012-03-02 DIAGNOSIS — Z8261 Family history of arthritis: Secondary | ICD-10-CM

## 2012-03-02 DIAGNOSIS — I119 Hypertensive heart disease without heart failure: Secondary | ICD-10-CM | POA: Diagnosis present

## 2012-03-02 DIAGNOSIS — Z87891 Personal history of nicotine dependence: Secondary | ICD-10-CM

## 2012-03-02 DIAGNOSIS — E039 Hypothyroidism, unspecified: Secondary | ICD-10-CM

## 2012-03-02 DIAGNOSIS — N179 Acute kidney failure, unspecified: Secondary | ICD-10-CM | POA: Diagnosis not present

## 2012-03-02 DIAGNOSIS — I4891 Unspecified atrial fibrillation: Secondary | ICD-10-CM | POA: Diagnosis present

## 2012-03-02 DIAGNOSIS — Z888 Allergy status to other drugs, medicaments and biological substances status: Secondary | ICD-10-CM

## 2012-03-02 DIAGNOSIS — Z95810 Presence of automatic (implantable) cardiac defibrillator: Secondary | ICD-10-CM

## 2012-03-02 DIAGNOSIS — I129 Hypertensive chronic kidney disease with stage 1 through stage 4 chronic kidney disease, or unspecified chronic kidney disease: Secondary | ICD-10-CM | POA: Diagnosis present

## 2012-03-02 DIAGNOSIS — I4892 Unspecified atrial flutter: Principal | ICD-10-CM | POA: Diagnosis present

## 2012-03-02 DIAGNOSIS — I959 Hypotension, unspecified: Secondary | ICD-10-CM | POA: Diagnosis present

## 2012-03-02 DIAGNOSIS — I2589 Other forms of chronic ischemic heart disease: Secondary | ICD-10-CM | POA: Diagnosis present

## 2012-03-02 DIAGNOSIS — I513 Intracardiac thrombosis, not elsewhere classified: Secondary | ICD-10-CM

## 2012-03-02 DIAGNOSIS — I252 Old myocardial infarction: Secondary | ICD-10-CM

## 2012-03-02 DIAGNOSIS — Z880 Allergy status to penicillin: Secondary | ICD-10-CM

## 2012-03-02 DIAGNOSIS — Z833 Family history of diabetes mellitus: Secondary | ICD-10-CM

## 2012-03-02 DIAGNOSIS — I255 Ischemic cardiomyopathy: Secondary | ICD-10-CM | POA: Diagnosis present

## 2012-03-02 DIAGNOSIS — R001 Bradycardia, unspecified: Secondary | ICD-10-CM

## 2012-03-02 DIAGNOSIS — I5042 Chronic combined systolic (congestive) and diastolic (congestive) heart failure: Secondary | ICD-10-CM | POA: Diagnosis present

## 2012-03-02 DIAGNOSIS — I5189 Other ill-defined heart diseases: Secondary | ICD-10-CM | POA: Diagnosis present

## 2012-03-02 DIAGNOSIS — I2510 Atherosclerotic heart disease of native coronary artery without angina pectoris: Secondary | ICD-10-CM | POA: Diagnosis present

## 2012-03-02 DIAGNOSIS — M5412 Radiculopathy, cervical region: Secondary | ICD-10-CM

## 2012-03-02 DIAGNOSIS — N183 Chronic kidney disease, stage 3 unspecified: Secondary | ICD-10-CM | POA: Diagnosis present

## 2012-03-02 DIAGNOSIS — I5023 Acute on chronic systolic (congestive) heart failure: Secondary | ICD-10-CM

## 2012-03-02 DIAGNOSIS — R079 Chest pain, unspecified: Secondary | ICD-10-CM | POA: Diagnosis present

## 2012-03-02 DIAGNOSIS — I498 Other specified cardiac arrhythmias: Secondary | ICD-10-CM | POA: Diagnosis not present

## 2012-03-02 DIAGNOSIS — I5022 Chronic systolic (congestive) heart failure: Secondary | ICD-10-CM | POA: Diagnosis present

## 2012-03-02 DIAGNOSIS — M542 Cervicalgia: Secondary | ICD-10-CM

## 2012-03-02 DIAGNOSIS — Z8673 Personal history of transient ischemic attack (TIA), and cerebral infarction without residual deficits: Secondary | ICD-10-CM

## 2012-03-02 DIAGNOSIS — M549 Dorsalgia, unspecified: Secondary | ICD-10-CM

## 2012-03-02 HISTORY — DX: Bradycardia, unspecified: R00.1

## 2012-03-02 LAB — CBC
HCT: 39.7 % (ref 36.0–46.0)
Hemoglobin: 13.3 g/dL (ref 12.0–15.0)
MCHC: 33.5 g/dL (ref 30.0–36.0)
RBC: 4.08 MIL/uL (ref 3.87–5.11)

## 2012-03-02 LAB — COMPREHENSIVE METABOLIC PANEL
ALT: 91 U/L — ABNORMAL HIGH (ref 0–35)
Alkaline Phosphatase: 80 U/L (ref 39–117)
BUN: 13 mg/dL (ref 6–23)
CO2: 22 mEq/L (ref 19–32)
Chloride: 103 mEq/L (ref 96–112)
GFR calc Af Amer: 56 mL/min — ABNORMAL LOW (ref >90)
GFR calc non Af Amer: 48 mL/min — ABNORMAL LOW (ref >90)
Glucose, Bld: 91 mg/dL (ref 70–99)
Potassium: 4.2 mEq/L (ref 3.5–5.1)
Sodium: 140 mEq/L (ref 135–145)
Total Bilirubin: 0.5 mg/dL (ref 0.3–1.2)

## 2012-03-02 LAB — POCT I-STAT, CHEM 8
BUN: 14 mg/dL (ref 6–23)
Chloride: 106 mEq/L (ref 96–112)
Creatinine, Ser: 1.2 mg/dL — ABNORMAL HIGH (ref 0.50–1.10)
Potassium: 4.2 mEq/L (ref 3.5–5.1)
Sodium: 142 mEq/L (ref 135–145)

## 2012-03-02 LAB — APTT: aPTT: 31 seconds (ref 24–37)

## 2012-03-02 LAB — PROTIME-INR: Prothrombin Time: 16.6 seconds — ABNORMAL HIGH (ref 11.6–15.2)

## 2012-03-02 MED ORDER — NITROGLYCERIN 0.4 MG SL SUBL
SUBLINGUAL_TABLET | SUBLINGUAL | Status: AC
  Filled 2012-03-02: qty 25

## 2012-03-02 MED ORDER — NITROGLYCERIN IN D5W 200-5 MCG/ML-% IV SOLN
INTRAVENOUS | Status: AC
  Filled 2012-03-02: qty 250

## 2012-03-02 MED ORDER — NITROGLYCERIN 0.4 MG SL SUBL
0.4000 mg | SUBLINGUAL_TABLET | SUBLINGUAL | Status: DC | PRN
Start: 2012-03-02 — End: 2012-03-02

## 2012-03-02 MED ORDER — NITROGLYCERIN IN D5W 200-5 MCG/ML-% IV SOLN: 3.0000 ug/min | INTRAVENOUS | Status: DC

## 2012-03-02 MED ORDER — ONDANSETRON HCL 4 MG/2ML IJ SOLN: 4.0000 mg | Freq: Four times a day (QID) | INTRAMUSCULAR | Status: DC | PRN

## 2012-03-02 MED ORDER — SODIUM CHLORIDE 0.9 % IV SOLN: INTRAVENOUS | Status: DC

## 2012-03-02 MED ORDER — POTASSIUM CHLORIDE CRYS ER 20 MEQ PO TBCR
20.0000 meq | EXTENDED_RELEASE_TABLET | Freq: Every day | ORAL | Status: DC
  Administered 2012-03-03 – 2012-03-05 (×3): 20 meq via ORAL
  Filled 2012-03-02 (×5): qty 1

## 2012-03-02 MED ORDER — WARFARIN SODIUM 4 MG PO TABS
4.5000 mg | ORAL_TABLET | Freq: Once | ORAL | Status: AC
  Administered 2012-03-02: 4.5 mg via ORAL
  Filled 2012-03-02: qty 1

## 2012-03-02 MED ORDER — PANTOPRAZOLE SODIUM 40 MG PO TBEC
40.0000 mg | DELAYED_RELEASE_TABLET | Freq: Two times a day (BID) | ORAL | Status: DC
  Administered 2012-03-02 – 2012-03-06 (×8): 40 mg via ORAL
  Filled 2012-03-02 (×9): qty 1

## 2012-03-02 MED ORDER — PREGABALIN 50 MG PO CAPS
50.0000 mg | ORAL_CAPSULE | Freq: Three times a day (TID) | ORAL | Status: DC
  Administered 2012-03-02 – 2012-03-06 (×11): 50 mg via ORAL
  Filled 2012-03-02 (×11): qty 1

## 2012-03-02 MED ORDER — ACETAMINOPHEN 325 MG PO TABS: 650.0000 mg | ORAL_TABLET | ORAL | Status: DC | PRN

## 2012-03-02 MED ORDER — NITROGLYCERIN IN D5W 200-5 MCG/ML-% IV SOLN
2.0000 ug/min | Freq: Once | INTRAVENOUS | Status: AC
  Administered 2012-03-02: 5 ug/min via INTRAVENOUS

## 2012-03-02 MED ORDER — CARVEDILOL 12.5 MG PO TABS
12.5000 mg | ORAL_TABLET | Freq: Two times a day (BID) | ORAL | Status: DC
  Administered 2012-03-03: 12.5 mg via ORAL
  Filled 2012-03-02 (×9): qty 1

## 2012-03-02 MED ORDER — AMIODARONE HCL 200 MG PO TABS
200.0000 mg | ORAL_TABLET | Freq: Every day | ORAL | Status: DC
  Administered 2012-03-03 – 2012-03-06 (×4): 200 mg via ORAL
  Filled 2012-03-02 (×5): qty 1

## 2012-03-02 MED ORDER — METHOCARBAMOL 500 MG PO TABS
500.0000 mg | ORAL_TABLET | Freq: Four times a day (QID) | ORAL | Status: DC | PRN
  Administered 2012-03-05 – 2012-03-06 (×2): 500 mg via ORAL
  Filled 2012-03-02 (×2): qty 1

## 2012-03-02 MED ORDER — CALCIUM CARBONATE-VITAMIN D 500-200 MG-UNIT PO TABS
1.0000 | ORAL_TABLET | Freq: Two times a day (BID) | ORAL | Status: DC
  Administered 2012-03-03 – 2012-03-06 (×7): 1 via ORAL
  Filled 2012-03-02 (×9): qty 1

## 2012-03-02 MED ORDER — POLYETHYLENE GLYCOL 3350 17 G PO PACK
17.0000 g | PACK | Freq: Every day | ORAL | Status: DC | PRN
  Administered 2012-03-05: 17 g via ORAL
  Filled 2012-03-02: qty 1

## 2012-03-02 MED ORDER — ASPIRIN EC 81 MG PO TBEC
81.0000 mg | DELAYED_RELEASE_TABLET | Freq: Every day | ORAL | Status: DC
  Administered 2012-03-03 – 2012-03-06 (×4): 81 mg via ORAL
  Filled 2012-03-02 (×4): qty 1

## 2012-03-02 MED ORDER — NITROGLYCERIN 0.4 MG SL SUBL: 0.4000 mg | SUBLINGUAL_TABLET | SUBLINGUAL | Status: DC | PRN

## 2012-03-02 MED ORDER — HEPARIN SODIUM (PORCINE) 5000 UNIT/ML IJ SOLN: 60.0000 [IU]/kg | INTRAMUSCULAR | Status: DC

## 2012-03-02 MED ORDER — LEVOTHYROXINE SODIUM 200 MCG PO TABS
200.0000 ug | ORAL_TABLET | Freq: Every day | ORAL | Status: DC
  Administered 2012-03-03 – 2012-03-06 (×4): 200 ug via ORAL
  Filled 2012-03-02 (×5): qty 1

## 2012-03-02 MED ORDER — METOPROLOL TARTRATE 1 MG/ML IV SOLN
2.5000 mg | Freq: Once | INTRAVENOUS | Status: AC
  Administered 2012-03-02: 2.5 mg via INTRAVENOUS

## 2012-03-02 MED ORDER — EZETIMIBE 10 MG PO TABS
10.0000 mg | ORAL_TABLET | Freq: Every day | ORAL | Status: DC
  Administered 2012-03-02 – 2012-03-05 (×4): 10 mg via ORAL
  Filled 2012-03-02 (×5): qty 1

## 2012-03-02 MED ORDER — METOPROLOL TARTRATE 1 MG/ML IV SOLN
INTRAVENOUS | Status: AC
  Filled 2012-03-02: qty 5

## 2012-03-02 MED ORDER — FUROSEMIDE 20 MG PO TABS
20.0000 mg | ORAL_TABLET | Freq: Every day | ORAL | Status: DC
  Administered 2012-03-04 – 2012-03-05 (×2): 20 mg via ORAL
  Filled 2012-03-02 (×5): qty 1

## 2012-03-02 MED ORDER — LISINOPRIL 2.5 MG PO TABS
2.5000 mg | ORAL_TABLET | Freq: Two times a day (BID) | ORAL | Status: DC
  Administered 2012-03-02: 2.5 mg via ORAL
  Filled 2012-03-02 (×10): qty 1

## 2012-03-02 MED ORDER — ASPIRIN 81 MG PO CHEW: 324.0000 mg | CHEWABLE_TABLET | Freq: Once | ORAL | Status: DC

## 2012-03-02 MED ORDER — WARFARIN - PHARMACIST DOSING INPATIENT
Freq: Every day | Status: DC
  Administered 2012-03-04: 18:00:00

## 2012-03-02 NOTE — ED Notes (Signed)
Paged Dr. Garnette Scheuermann to Dr. Lynelle Doctor @ [phone] per Charge RN

## 2012-03-02 NOTE — ED Provider Notes (Signed)
History    CSN: [id number] Arrival date & time 03/02/12  1612 First MD Initiated Contact with Patient 03/02/12 1628      Chief Complaint  Patient presents with  . Code STEMI    Patient is a 61 y.o. female presenting with chest pain. The history is provided by the patient.  Chest Pain The chest pain began less than 1 hour ago. Chest pain occurs constantly. The chest pain is unchanged. Associated with: Pt had her defibrillator go off.  She had no warning.  After that she developed chest pain. The pain is currently at 5/10. The severity of the pain is severe. The quality of the pain is described as aching. The pain does not radiate. Pertinent negatives for primary symptoms include no fever, no cough, no nausea and no vomiting. She tried nitroglycerin and aspirin for the symptoms.  Her past medical history is significant for CAD and MI.   Pt has had her defibrillator go off before.  She had had MI before as well and is not sure which this feels like.  Her pain did get a little better with medications but it still is hurting.  Past Medical History  Diagnosis Date  . Spinal stenosis   . Numbness of foot     left foot  . Diverticulitis     Status post partial colectomy 1/12 with reversal in July 2012  . Colitis, ischemic   . DJD (degenerative joint disease)     History of multiple surgeries to the back, shoulder and knee  . Ischemic cardiomyopathy     Echo 06/16/10: EF 25-30%, anteroseptal and apical hypokinesis, moderate AI, mild MR  . Chronic systolic heart failure   . CAD (coronary artery disease)     a.  Ant MI 8/03 with stenting of the LAD;  b. staged PCI with Cypher DES to OM1 8/03;  c. s/p Cypher DES to LAD 7/04;  d.  multiple cardiac caths in past (chronically abnl ECG);    e. cardiac catheterization 3/12: LAD stent patent, distal LAD 40-50%, small ostial D1 80%, ostial D2 60%, OM-1 stent patent (20-30%), proximal RCA 50%, proximal to mid RCA 40-50%; f. cath 02/17/2011 - nonobs  . LV  (left ventricular) mural thrombus     Chronic Coumadin therapy  . CKD (chronic kidney disease), stage III     creatinine:  1.3 in 4/12;    . Tobacco abuse     history  . GERD (gastroesophageal reflux disease)   . Hypertension   . Lower GI bleed     August 2011  . Hypothyroidism   . Aortic insufficiency   . Pseudoaneurysm     History of, right forearm  . Hyperlipidemia   . OA (osteoarthritis)   . Abnormal EKG     Chronically abnormal EKG.   . Spinal stenosis   . Numbness of foot     Left foot  . Compression fracture 07/04/11    L2  . Anterior myocardial infarction 10/2001  . SVT (supraventricular tachycardia)     ? h/o AFib;  patient on long term amiodarone therapy  . PAF (paroxysmal atrial fibrillation)   . Anatomical narrow angle of right eye   . Heart murmur     "age 10-19"  . Shortness of breath     "at times; related to CHF"  . Shortness of breath on exertion   . 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 Graciela Husbands.   [name]  AICD (automatic cardioverter/defibrillator) present 10/2004    explant; implant  . Blood transfusion   . Anemia   . H/O hiatal hernia   . Stroke     History of TIA and possible CVA; hospitalized 2004; on coumadin  . Systolic heart failure     Past Surgical History  Procedure Date  . Back surgery   . Coronary angioplasty with stent placement 10/2001; 09/2002  . Cardiac defibrillator placement 07/2003; 10/2004  . Lumbar laminectomy/decompression microdiscectomy 10/2001    L5-S1/E-chart  . Knee arthroscopy     left  . Thyroidectomy 1970's  . Total knee arthroplasty 11/2003    left  . X-stop implantation 12/2004    L3-4; L4-5  . Fixation kyphoplasty thoracic spine 07/2007    T3, 4, 6 compression fractures  . Shoulder open rotator cuff repair 04/2008    right  . Lumbar laminectomy/decompression microdiscectomy 01/2010  . Colectomy 03/2010    sigmoid left  . Colostomy 03/2010    transverse  . Peripherally inserted  central catheter insertion 03/2010    removed upon discharge  . Colostomy closure 12/2010    reversal  . Tonsillectomy and adenoidectomy 1969  . Appendectomy 03/2010  . Cataract extraction     right    Family History  Problem Relation Age of Onset  . Diabetes Mother   . Diabetes Brother   . Arthritis      family history  . Prostate cancer      Family History    History  Substance Use Topics  . Smoking status: Former Smoker -- 0.1 packs/day for 33 years    Types: Cigarettes    Quit date: 04/21/2001  . Smokeless tobacco: Never Used     Comment: "smoked ~ 1 pack/month"  . Alcohol Use: No     Comment: 07/05/11 "occasionally; last time was glass of wine last week"    OB History    Grav Para Term Preterm Abortions TAB SAB Ect Mult Living                  Review of Systems  Constitutional: Negative for fever.  Respiratory: Negative for cough.   Cardiovascular: Positive for chest pain.  Gastrointestinal: Negative for nausea and vomiting.    Allergies  Lansoprazole; Penicillins; and Statins  Home Medications   Current Outpatient Rx  Name  Route  Sig  Dispense  Refill  . AMIODARONE HCL 200 MG PO TABS   Oral   Take 200 mg by mouth daily.         . ASPIRIN EC 81 MG PO TBEC   Oral   Take 81 mg by mouth daily.         . BUSPIRONE HCL 15 MG PO TABS   Oral   Take 15 mg by mouth daily as needed. For mood stabilization.         [name] CALCIUM CARBONATE-VITAMIN D 500-200 MG-UNIT PO TABS   Oral   Take 1 tablet by mouth 2 (two) times daily with a meal.   60 tablet   1   . CARVEDILOL 12.5 MG PO TABS   Oral   Take 1 tablet (12.5 mg total) by mouth 2 (two) times daily with a meal.   60 tablet   6   . CYCLOBENZAPRINE HCL 5 MG PO TABS   Oral   Take 5 mg by mouth 2 (two) times daily as needed. For muscle spasms.         [name] EZETIMIBE  10 MG PO TABS   Oral   Take 10 mg by mouth at bedtime.          [name] FEXOFENADINE HCL 180 MG PO TABS   Oral   Take 180 mg by mouth  daily.          . OMEGA-3 FATTY ACIDS 1000 MG PO CAPS   Oral   Take 1,000 mg by mouth daily.           [name] FLUTICASONE PROPIONATE 50 MCG/ACT NA SUSP   Nasal   Place 2 sprays into the nose daily as needed. For allergies         . FA-PYRIDOXINE-CYANCOBALAMIN 2.5-25-2 MG PO TABS   Oral   Take 1 tablet by mouth daily.           . FUROSEMIDE 20 MG PO TABS      TAKE ONE TABLET BY MOUTH EVERY DAY   30 tablet   5   . HYDROCODONE-ACETAMINOPHEN 5-325 MG PO TABS   Oral   Take 1 tablet by mouth every 6 (six) hours as needed for pain.   20 tablet   0   . LEVOTHYROXINE SODIUM 200 MCG PO TABS   Oral   Take 1 tablet (200 mcg total) by mouth daily.   30 tablet   6   . LISINOPRIL 5 MG PO TABS   Oral   Take 2.5 mg by mouth 2 (two) times daily.         [name] METHOCARBAMOL 500 MG PO TABS   Oral   Take 500 mg by mouth 4 (four) times daily as needed. For spasms          . NITROGLYCERIN 0.4 MG SL SUBL   Sublingual   Place 0.4 mg under the tongue every 5 (five) minutes as needed. For chest pain         . PANTOPRAZOLE SODIUM 40 MG PO TBEC   Oral   Take 40 mg by mouth 2 (two) times daily.          [name] POLYETHYLENE GLYCOL 3350 PO PACK   Oral   Take 17 g by mouth daily as needed. For constipation         . POTASSIUM CHLORIDE CRYS ER 20 MEQ PO TBCR   Oral   Take 20 mEq by mouth daily.         [name] PREGABALIN 50 MG PO CAPS   Oral   Take 1 capsule (50 mg total) by mouth 3 (three) times daily.   90 capsule   5   . RALOXIFENE HCL 60 MG PO TABS   Oral   Take 60 mg by mouth daily.         . WARFARIN SODIUM 3 MG PO TABS   Oral   Take 3 mg by mouth at bedtime.            Pulse 83  Temp 98.9 F (37.2 C) (Oral)  Resp 12  SpO2 94%  Physical Exam  Nursing note and vitals reviewed. Constitutional: She appears well-developed and well-nourished. No distress.  HENT:  Head: Normocephalic and atraumatic.  Right Ear: External ear normal.  Left Ear: External ear normal.   Eyes: Conjunctivae normal are normal. Right eye exhibits no discharge. Left eye exhibits no discharge. No scleral icterus.  Neck: Neck supple. No tracheal deviation present.  Cardiovascular: Normal rate, regular rhythm and intact distal pulses.   Pulmonary/Chest: Effort normal and breath sounds normal. No stridor.  No respiratory distress. She has no wheezes. She has no rales.  Abdominal: Soft. Bowel sounds are normal. She exhibits no distension. There is no tenderness. There is no rebound and no guarding.  Musculoskeletal: She exhibits no edema and no tenderness.  Neurological: She is alert. She has normal strength. No sensory deficit. Cranial nerve deficit:  no gross defecits noted. She exhibits normal muscle tone. She displays no seizure activity. Coordination normal.  Skin: Skin is warm and dry. No rash noted.  Psychiatric: She has a normal mood and affect.    ED Course  Procedures (including critical care time) EKG Rate 83 SINUS RHYTHM ~ normal P axis, V-rate 50- 99 INFERIOR INFARCT, POSSIBLY ACUTE ~ Q >62mS, ST >0.41mV, II III aVF PROBABLE ANTERIOR INFARCT, ACUTE ~ Q >75mS, dim R, ST >0.26mV, T neg Similar appearance on prior EKG but lateral st changes more prominent  Labs Reviewed  APTT  CBC  COMPREHENSIVE METABOLIC PANEL  PROTIME-INR   No results found.    MDM  A code stemi was initially called.  EKG comparison shows that this is somewhat similar to prior EKGs.  After discussion with Dr Mayford Knife and Eldridge Dace, plan is to not take her to the cath lab at this time.  Continue medical management and plan on admission.        Celene Kras, MD 03/02/12 [phone]

## 2012-03-02 NOTE — H&P (Signed)
Admit date: 03/02/2012 Referring Physician  Dr. Lynelle Doctor Primary Cardiologist  Dr. Verdis Prime Chief complaint/reason for admission:  Chest pain and AICD firing  HPI: This is a 61yo AAF with a history of CAD s/p multiple PCI's with last cath 01/2011 showing nonobstructive ASCAD, ischemic DMC EF 25-30% with anteroseptal and apical HK and chronic systolic CHF who presented to the ER with complaints of CP and a code STEMI was called.  The patient was at the drug store and went to her car and had an AICD firing.  Right after that she has severe CP and EMS was called.  Currently her pain is a 6/10.  She was mildly SOB but no nausea or diaphoresis.  Currently CP is a 5/10.  Dr. Eldridge Dace was called and EKG reviewed at bedside.  Her EKG is essentially unchanged from prior EKGs with chronic J point elevation in V3-V5 and Q waves in the inferior and anterior leads.      PMH:    Past Medical History  Diagnosis Date  . Spinal stenosis   . Numbness of foot     left foot  . Diverticulitis     Status post partial colectomy 1/12 with reversal in July 2012  . Colitis, ischemic   . DJD (degenerative joint disease)     History of multiple surgeries to the back, shoulder and knee  . Ischemic cardiomyopathy     Echo 06/16/10: EF 25-30%, anteroseptal and apical hypokinesis, moderate AI, mild MR  . Chronic systolic heart failure   . CAD (coronary artery disease)     a.  Ant MI 8/03 with stenting of the LAD;  b. staged PCI with Cypher DES to OM1 8/03;  c. s/p Cypher DES to LAD 7/04;  d.  multiple cardiac caths in past (chronically abnl ECG);    e. cardiac catheterization 3/12: LAD stent patent, distal LAD 40-50%, small ostial D1 80%, ostial D2 60%, OM-1 stent patent (20-30%), proximal RCA 50%, proximal to mid RCA 40-50%; f. cath 02/17/2011 - nonobs  . LV (left ventricular) mural thrombus     Chronic Coumadin therapy  . CKD (chronic kidney disease), stage III     creatinine:  1.3 in 4/12;    . Tobacco abuse      history  . GERD (gastroesophageal reflux disease)   . Hypertension   . Lower GI bleed     August 2011  . Hypothyroidism   . Aortic insufficiency   . Pseudoaneurysm     History of, right forearm  . Hyperlipidemia   . OA (osteoarthritis)   . Abnormal EKG     Chronically abnormal EKG.   . Spinal stenosis   . Numbness of foot     Left foot  . Compression fracture 07/04/11    L2  . Anterior myocardial infarction 10/2001  . SVT (supraventricular tachycardia)     ? h/o AFib;  patient on long term amiodarone therapy  . PAF (paroxysmal atrial fibrillation)   . Anatomical narrow angle of right eye   . Heart murmur     "age [age]"  . Shortness of breath     "at times; related to CHF"  . Shortness of breath on exertion   . 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 Graciela Husbands.   [name] AICD (automatic cardioverter/defibrillator) present 10/2004    explant; implant  . Blood transfusion   . Anemia   . H/O hiatal hernia   . Stroke  History of TIA and possible CVA; hospitalized 2004; on coumadin  . Systolic heart failure     PSH:    Past Surgical History  Procedure Date  . Back surgery   . Coronary angioplasty with stent placement 10/2001; 09/2002  . Cardiac defibrillator placement 07/2003; 10/2004  . Lumbar laminectomy/decompression microdiscectomy 10/2001    L5-S1/E-chart  . Knee arthroscopy     left  . Thyroidectomy 1970's  . Total knee arthroplasty 11/2003    left  . X-stop implantation 12/2004    L3-4; L4-5  . Fixation kyphoplasty thoracic spine 07/2007    T3, 4, 6 compression fractures  . Shoulder open rotator cuff repair 04/2008    right  . Lumbar laminectomy/decompression microdiscectomy 01/2010  . Colectomy 03/2010    sigmoid left  . Colostomy 03/2010    transverse  . Peripherally inserted central catheter insertion 03/2010    removed upon discharge  . Colostomy closure 12/2010    reversal  . Tonsillectomy and adenoidectomy 1969  . Appendectomy  03/2010  . Cataract extraction     right    ALLERGIES:   Lansoprazole; Penicillins; and Statins  Prior to Admit Meds:   (Not in a hospital admission) Family HX:    Family History  Problem Relation Age of Onset  . Diabetes Mother   . Diabetes Brother   . Arthritis      family history  . Prostate cancer      Family History   Social HX:    History   Social History  . Marital Status: Widowed    Spouse Name: N/A    Number of Children: N/A  . Years of Education: N/A   Occupational History  . Not on file.   Social History Main Topics  . Smoking status: Former Smoker -- 0.1 packs/day for 33 years    Types: Cigarettes    Quit date: 04/21/2001  . Smokeless tobacco: Never Used     Comment: "smoked ~ 1 pack/month"  . Alcohol Use: No     Comment: 07/05/11 "occasionally; last time was glass of wine last week"  . Drug Use: No  . Sexually Active: Yes   Other Topics Concern  . Not on file   Social History Narrative  . No narrative on file     ROS:  All 11 ROS were addressed and are negative except what is stated in the HPI  PHYSICAL EXAM Filed Vitals:   03/02/12 1618  Pulse: 83  Temp: 98.9 F (37.2 C)  Resp: 12   General: Well developed, well nourished, in no acute distress Head: Eyes PERRLA, No xanthomas.   Normal cephalic and atramatic  Lungs:   Clear bilaterally to auscultation and percussion. Heart:   HRRR S1 S2 Pulses are 2+ & equal.            No carotid bruit. No JVD.  No abdominal bruits. No femoral bruits. Abdomen: Bowel sounds are positive, abdomen soft and non-tender without masses or                  Hernia's noted. Msk:  Back normal, normal gait. Normal strength and tone for age. Extremities:   No clubbing, cyanosis or edema.  DP +1 Neuro: Alert and oriented X 3. Psych:  Good affect, responds appropriately   Labs:   Lab Results  Component Value Date   WBC 5.1 02/27/2012   HGB 14.2 02/27/2012   HCT 41.0 02/27/2012   MCV 96.2 02/27/2012   PLT  136*  02/27/2012    Lab 02/27/12 2356 02/27/12 1145  NA -- 135  K -- 4.4  CL -- 98  CO2 -- 23  BUN -- 16  CREATININE -- 1.19*  CALCIUM -- 9.2  PROT 6.6 --  BILITOT 0.5 --  ALKPHOS 47 --  ALT 37* --  AST 60* --  GLUCOSE -- 99   Lab Results  Component Value Date   CKTOTAL 79 09/15/2011   CKMB 2.2 09/15/2011   TROPONINI <0.30 09/15/2011   No results found for this basename: PTT   Lab Results  Component Value Date   INR 1.8 11/03/2011   INR 1.6 10/24/2011   INR 3.3 10/17/2011     Lab Results  Component Value Date   CHOL 225* 12/15/2011   CHOL 209* 02/18/2011   CHOL 242* 02/17/2011   Lab Results  Component Value Date   HDL 87.20 12/15/2011   HDL 161 02/18/2011   HDL 114 02/17/2011   Lab Results  Component Value Date   LDLCALC 94 02/18/2011   LDLCALC 118* 02/17/2011   LDLCALC 80 12/30/2010   Lab Results  Component Value Date   TRIG 100.0 12/15/2011   TRIG 56 02/18/2011   TRIG 49 02/17/2011   Lab Results  Component Value Date   CHOLHDL 3 12/15/2011   CHOLHDL 2.0 02/18/2011   CHOLHDL 2.1 02/17/2011   Lab Results  Component Value Date   LDLDIRECT 126.3 12/15/2011      Radiology:  Pending  EKG:  NSR with Q waves in inferior leads and in V3-V5 with J point elevation in V3-V5 essentially unchange from EKG 08/2011  ASSESSMENT:  1.  AICD firing 2.  Chest pain ? Secondary to #1.  EKG reviewed with Dr. Eldridge Dace who feels patient is not having a STEMI.  Since she is on chronic anticoagulation with evidence of new EKG changes, will treat medically for now. 3.  CAD with extensive history of multiple stents with last cath a year ago with nonobstructive ASCAD 4.  Ischemic DCM EF 25% s/p AICD 5.  Chronic systolic CHF 6.  Mural thrombus on chronic anticoagulation 7.  Stage 3 Chronic kidney disease 8.  PAF on Amiodarone 9.  HTN\   PLAN:   1.  Admit to stepdown bed 2.  Cycle cardiac enzymes 3.  IV NTG gtt for CP 4.  No IV Heparin since patient is on coumadin 5.  Check  INR 6.  Continue home meds 7.  Further workup of CP pending results of enzymes (may need to rely on enzyme trend since they will probably be elevated somewhat from AICD discharge) 8.  Interrogate AICD  Quintella Reichert, MD  03/02/2012  4:42 PM

## 2012-03-02 NOTE — ED Notes (Signed)
Pt placed on zoll pads, EDP at the bedside.

## 2012-03-02 NOTE — Consult Note (Signed)
ANTICOAGULATION CONSULT NOTE - Initial Consult  Pharmacy Consult for Coumadin Indication: hx LV thrombus, PAF  Allergies  Allergen Reactions  . Lansoprazole Nausea Only  . Penicillins Swelling    Throat swells  . Statins Other (See Comments)    cramps   Vital Signs: Temp: 98.4 F (36.9 C) (12/13 1747) Temp src: Oral (12/13 1747) BP: 127/68 mmHg (12/13 1823) Pulse Rate: 62  (12/13 1700)  Labs:  Basename 03/02/12 1745  HGB 13.3  HCT 39.7  PLT 161  APTT 31  LABPROT 16.6*  INR 1.38  HEPARINUNFRC --  CREATININE 1.19*  CKTOTAL --  CKMB --  TROPONINI --    The CrCl is unknown because both a height and weight (above a minimum accepted value) are required for this calculation.   Medical History: Past Medical History  Diagnosis Date  . Spinal stenosis   . Numbness of foot     left foot  . Diverticulitis     Status post partial colectomy 1/12 with reversal in July 2012  . Colitis, ischemic   . DJD (degenerative joint disease)     History of multiple surgeries to the back, shoulder and knee  . Ischemic cardiomyopathy     Echo 06/16/10: EF 25-30%, anteroseptal and apical hypokinesis, moderate AI, mild MR  . Chronic systolic heart failure   . CAD (coronary artery disease)     a.  Ant MI 8/03 with stenting of the LAD;  b. staged PCI with Cypher DES to OM1 8/03;  c. s/p Cypher DES to LAD 7/04;  d.  multiple cardiac caths in past (chronically abnl ECG);    e. cardiac catheterization 3/12: LAD stent patent, distal LAD 40-50%, small ostial D1 80%, ostial D2 60%, OM-1 stent patent (20-30%), proximal RCA 50%, proximal to mid RCA 40-50%; f. cath 02/17/2011 - nonobs  . LV (left ventricular) mural thrombus     Chronic Coumadin therapy  . CKD (chronic kidney disease), stage III     creatinine:  1.3 in 4/12;    . Tobacco abuse     history  . GERD (gastroesophageal reflux disease)   . Hypertension   . Lower GI bleed     August 2011  . Hypothyroidism   . Aortic insufficiency   .  Pseudoaneurysm     History of, right forearm  . Hyperlipidemia   . OA (osteoarthritis)   . Abnormal EKG     Chronically abnormal EKG.   . Spinal stenosis   . Numbness of foot     Left foot  . Compression fracture 07/04/11    L2  . Anterior myocardial infarction 10/2001  . SVT (supraventricular tachycardia)     ? h/o AFib;  patient on long term amiodarone therapy  . PAF (paroxysmal atrial fibrillation)   . Anatomical narrow angle of right eye   . Heart murmur     "age 35-19"  . Shortness of breath     "at times; related to CHF"  . Shortness of breath on exertion   . 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 Graciela Husbands.   [name] AICD (automatic cardioverter/defibrillator) present 10/2004    explant; implant  . Blood transfusion   . Anemia   . H/O hiatal hernia   . Stroke     History of TIA and possible CVA; hospitalized 2004; on coumadin  . Systolic heart failure    Assessment: 61yof on coumadin pta (3mg  daily) for hx LV thrombus and  PAF presents to the ED as a Code Stemi after her AICD fired 1 time. Code Stemi cancelled as st elevation felt to be chronic. INR on admission is subtherapeutic at 1.38. Patient says she was in the hospital this past Tuesday and Wednesday and did not received coumadin. She took one dose yesterday and has not had any today as she takes it at bedtime.   Goal of Therapy:  INR 2-3 Monitor platelets by anticoagulation protocol: Yes   Plan:  1) Coumadin 4.5mg  x 1 tonight 2) Daily INR 3) Do we need to bridge with heparin?  [name] 03/02/2012,6:53 PM

## 2012-03-02 NOTE — ED Notes (Signed)
EKG completed at 1619 and given to Dr. Lynelle Doctor along with OLD ekg.

## 2012-03-02 NOTE — ED Notes (Signed)
Cardiology at the bedside. Rates pain 6/10.

## 2012-03-02 NOTE — ED Notes (Signed)
Pt to department via EMS from- pt was recently discharged with back pain. Reports she was at the store and de-fib fired once. Pt given 6 81 mg ASA by staff at the store. 1 nitro given by EMS, with pain relief. 22g L forearm. Elevation in 456

## 2012-03-03 DIAGNOSIS — R079 Chest pain, unspecified: Secondary | ICD-10-CM

## 2012-03-03 LAB — PROTIME-INR: Prothrombin Time: 18.3 seconds — ABNORMAL HIGH (ref 11.6–15.2)

## 2012-03-03 LAB — T4, FREE: Free T4: 0.95 ng/dL (ref 0.80–1.80)

## 2012-03-03 MED ORDER — WARFARIN SODIUM 4 MG PO TABS
4.5000 mg | ORAL_TABLET | Freq: Once | ORAL | Status: AC
  Administered 2012-03-03: 4.5 mg via ORAL
  Filled 2012-03-03: qty 1

## 2012-03-03 MED ORDER — SODIUM CHLORIDE 0.9 % IV BOLUS (SEPSIS)
500.0000 mL | Freq: Once | INTRAVENOUS | Status: AC
  Administered 2012-03-03: 500 mL via INTRAVENOUS

## 2012-03-03 NOTE — Progress Notes (Addendum)
Subjective:  Feels okay. BP very low 65/31 this am.  Denies any nausea or dyspnea or chest pain. Received coreg earlier this am.  Objective:  Vital Signs in the last 24 hours: Temp:  [97.7 F (36.5 C)-98.9 F (37.2 C)] 98.3 F (36.8 C) (12/14 0730) Pulse Rate:  [46-83] 49  (12/14 1009) Resp:  [9-20] 9  (12/14 1009) BP: (65-127)/(38-77) 70/38 mmHg (12/14 1009) SpO2:  [94 %-100 %] 100 % (12/14 1009) Weight:  [149 lb 7.6 oz (67.8 kg)] 149 lb 7.6 oz (67.8 kg) (12/13 2000)  Intake/Output from previous day: 12/13 0701 - 12/14 0700 In: 480.9 [P.O.:360; I.V.:120.9] Out: 300 [Urine:300] Intake/Output from this shift: Total I/O In: 510 [P.O.:480; I.V.:30] Out: -      . amiodarone  200 mg Oral Daily  . aspirin EC  81 mg Oral Daily  . calcium-vitamin D  1 tablet Oral BID WC  . carvedilol  12.5 mg Oral BID WC  . ezetimibe  10 mg Oral QHS  . furosemide  20 mg Oral Daily  . levothyroxine  200 mcg Oral QAC breakfast  . lisinopril  2.5 mg Oral BID  . pantoprazole  40 mg Oral BID  . potassium chloride SA  20 mEq Oral Daily  . pregabalin  50 mg Oral TID  . Warfarin - Pharmacist Dosing Inpatient   Does not apply q1800      . sodium chloride 10 mL/hr at 03/03/12 0600  . nitroGLYCERIN Stopped (03/03/12 0000)    Physical Exam: The patient appears to be in no distress.  Head and neck exam reveals that the pupils are equal and reactive.  The extraocular movements are full.  There is no scleral icterus.  Mouth and pharynx are benign.  No lymphadenopathy.  No carotid bruits.  The jugular venous pressure is normal.  Thyroid is not enlarged or tender.  Chest is clear to percussion and auscultation.  No rales or rhonchi.  Expansion of the chest is symmetrical.  Heart reveals no abnormal lift or heave.  First and second heart sounds are normal.  There is no murmur gallop rub or click.  The abdomen is soft and nontender.  Bowel sounds are normoactive.  There is no hepatosplenomegaly or  mass.  There are no abdominal bruits.  Extremities reveal no phlebitis or edema.  Pedal pulses are good.  There is no cyanosis or clubbing.  Neurologic exam is normal strength and no lateralizing weakness.  No sensory deficits.  Integument reveals no rash  Lab Results:  Basename 03/02/12 2010 03/02/12 1745  WBC -- 4.6  HGB 13.6 13.3  PLT -- 161    Basename 03/02/12 2010 03/02/12 1745  NA 142 140  K 4.2 4.2  CL 106 103  CO2 -- 22  GLUCOSE 77 91  BUN 14 13  CREATININE 1.20* 1.19*    Basename 03/03/12 0900 03/03/12 0236  TROPONINI <0.30 <0.30   Hepatic Function Panel  Basename 03/02/12 1745  PROT 6.5  ALBUMIN 3.3*  AST 131*  ALT 91*  ALKPHOS 80  BILITOT 0.5  BILIDIR --  IBILI --   No results found for this basename: CHOL in the last 72 hours No results found for this basename: PROTIME in the last 72 hours  Imaging: Imaging results have been reviewed  Cardiac Studies: Telemetry shows sinus bradycardia. Assessment/Plan:  Patient Active Hospital Problem List: Chest pain (03/02/2012)   Assessment: No chest pain   Plan: Follow enzymes.  EKG shows no acute changes. AICD  discharge (03/02/2012)   Assessment: She has a QUALCOMM: Request interrogation of device. Hypotension   Assessment: Asymptomatic    Plan: parameters for coreg and lisinopril.             IV saline bolus 500 cc. Hypothyroidism     Assessment: TSH elevated 23.286     Plan: Check FT4,   Continue synthroid.  May need higher dose going forward. May be related to chronic amio.  LOS: 1 day    Cassell Clement 03/03/2012, 10:37 AM

## 2012-03-03 NOTE — Progress Notes (Signed)
Notified Bailey Mech NP of patients continued low blood pressure that is currently 76/40. Also made her aware that they were interrogating patients AICD device and no new orders at this time. Will continue to monitor.

## 2012-03-03 NOTE — Progress Notes (Signed)
ANTICOAGULATION CONSULT NOTE - Follow Up Consult  Pharmacy Consult for coumadin  Indication: atrial fibrillation and hx LV thrombus   Allergies  Allergen Reactions  . Lansoprazole Nausea Only  . Penicillins Swelling    Throat swells  . Statins Other (See Comments)    cramps  . Lactose Intolerance (Gi) Diarrhea and Other (See Comments)    Patient reports abdominal cramping and diarrhea with lactose products.     Patient Measurements: Height: 5\' 6"  (167.6 cm) Weight: 149 lb 7.6 oz (67.8 kg) IBW/kg (Calculated) : 59.3   Vital Signs: Temp: 98.7 F (37.1 C) (12/14 1230) Temp src: Oral (12/14 1230) BP: 81/49 mmHg (12/14 1600) Pulse Rate: 48  (12/14 1600)  Labs:  Basename 03/03/12 0900 03/03/12 0529 03/03/12 0236 03/02/12 2033 03/02/12 2010 03/02/12 1745  HGB -- -- -- -- 13.6 13.3  HCT -- -- -- -- 40.0 39.7  PLT -- -- -- -- -- 161  APTT -- -- -- -- -- 31  LABPROT -- 18.3* -- -- -- 16.6*  INR -- 1.57* -- -- -- 1.38  HEPARINUNFRC -- -- -- -- -- --  CREATININE -- -- -- -- 1.20* 1.19*  CKTOTAL -- -- -- -- -- --  CKMB -- -- -- -- -- --  TROPONINI <0.30 -- <0.30 <0.30 -- --    Estimated Creatinine Clearance: 45.5 ml/min (by C-G formula based on Cr of 1.2).  Assessment: 61yof on coumadin pta (3mg  daily) for hx LV thrombus and PAF presented to the ED as a Code Stemi after her AICD fired 1 time. No chest pain. CBC from 12/13 wnl. INR 1.57. No bleeding noted. Noted patient on amiodarone pta and continued in hospital.   Goal of Therapy:  INR 2-3 Monitor platelets by anticoagulation protocol: Yes   Plan:  Repeat coumadin 4.5mg  po x 1 dose today F/u am INR   Thank you,  Brett Fairy, PharmD, BCPS 03/03/2012 4:16 PM

## 2012-03-03 NOTE — Progress Notes (Signed)
Patient has not voided for over 12 hours.  Patient requesting foley catheter.  Dr.Balfour notified of all parameters.  Orders rec'd.

## 2012-03-04 DIAGNOSIS — I509 Heart failure, unspecified: Secondary | ICD-10-CM

## 2012-03-04 DIAGNOSIS — I5023 Acute on chronic systolic (congestive) heart failure: Secondary | ICD-10-CM

## 2012-03-04 LAB — PROTIME-INR
INR: 1.76 — ABNORMAL HIGH (ref 0.00–1.49)
Prothrombin Time: 19.9 seconds — ABNORMAL HIGH (ref 11.6–15.2)

## 2012-03-04 MED ORDER — WARFARIN 0.5 MG HALF TABLET
4.5000 mg | ORAL_TABLET | Freq: Once | ORAL | Status: AC
  Administered 2012-03-04: 4.5 mg via ORAL
  Filled 2012-03-04: qty 1

## 2012-03-04 MED ORDER — HYDROCODONE-ACETAMINOPHEN 5-325 MG PO TABS
1.0000 | ORAL_TABLET | Freq: Four times a day (QID) | ORAL | Status: DC | PRN
  Administered 2012-03-04 – 2012-03-06 (×6): 1 via ORAL
  Filled 2012-03-04 (×6): qty 1

## 2012-03-04 NOTE — Progress Notes (Signed)
Utilization Review Completed.[name] T12/15/2013   

## 2012-03-04 NOTE — Progress Notes (Signed)
ANTICOAGULATION CONSULT NOTE - Follow Up Consult  Pharmacy Consult for coumadin  Indication: atrial fibrillation and hx LV thrombus   Allergies  Allergen Reactions  . Lansoprazole Nausea Only  . Penicillins Swelling    Throat swells  . Statins Other (See Comments)    cramps  . Lactose Intolerance (Gi) Diarrhea and Other (See Comments)    Patient reports abdominal cramping and diarrhea with lactose products.     Patient Measurements: Height: 5\' 6"  (167.6 cm) Weight: 154 lb 8.7 oz (70.1 kg) IBW/kg (Calculated) : 59.3   Vital Signs: Temp: 98 F (36.7 C) (12/15 1111) Temp src: Oral (12/15 1111) BP: 90/45 mmHg (12/15 1223) Pulse Rate: 69  (12/15 1223)  Labs:  Basename 03/04/12 0700 03/03/12 0900 03/03/12 0529 03/03/12 0236 03/02/12 2033 03/02/12 2010 03/02/12 1745  HGB -- -- -- -- -- 13.6 13.3  HCT -- -- -- -- -- 40.0 39.7  PLT -- -- -- -- -- -- 161  APTT -- -- -- -- -- -- 31  LABPROT 19.9* -- 18.3* -- -- -- 16.6*  INR 1.76* -- 1.57* -- -- -- 1.38  HEPARINUNFRC -- -- -- -- -- -- --  CREATININE -- -- -- -- -- 1.20* 1.19*  CKTOTAL -- -- -- -- -- -- --  CKMB -- -- -- -- -- -- --  TROPONINI -- <0.30 -- <0.30 <0.30 -- --    Estimated Creatinine Clearance: 45.5 ml/min (by C-G formula based on Cr of 1.2).  Assessment: 61yof on coumadin pta (3mg  daily) for hx LV thrombus and PAF presented to the ED as a Code Stemi after her AICD fired 1 time. No chest pain. CBC from 12/13 wnl. INR up to 1.76 after increasing her Coumadin dose 12/13. No bleeding noted. She continues on chronic amiodarone therapy as well.  Goal of Therapy:  INR 2-3 Monitor platelets by anticoagulation protocol: Yes   Plan:  Repeat coumadin 4.5mg  x 1 dose today F/u am PT/INR   Estella Husk, Pharm.D., BCPS Clinical Pharmacist  Phone [phone] Pager [phone] 03/04/2012, 1:01 PM

## 2012-03-04 NOTE — Progress Notes (Signed)
Subjective:  Feels like she is building up some fluid. Urine output down.  Lasix was held yesterday because of low BP. Coreg and lisinopril were also held.  Objective:  Vital Signs in the last 24 hours: Temp:  [98 F (36.7 C)-98.7 F (37.1 C)] 98.2 F (36.8 C) (12/15 0800) Pulse Rate:  [42-102] 48  (12/15 1000) Resp:  [10-18] 15  (12/15 1000) BP: (67-96)/(35-55) 85/40 mmHg (12/15 1000) SpO2:  [90 %-100 %] 100 % (12/15 1000) Weight:  [154 lb 8.7 oz (70.1 kg)] 154 lb 8.7 oz (70.1 kg) (12/15 0500)  Intake/Output from previous day: 12/14 0701 - 12/15 0700 In: 1809.2 [P.O.:1180; I.V.:629.2] Out: 770 [Urine:770] Intake/Output from this shift: Total I/O In: 360 [P.O.:360] Out: 425 [Urine:425]     . amiodarone  200 mg Oral Daily  . aspirin EC  81 mg Oral Daily  . calcium-vitamin D  1 tablet Oral BID WC  . carvedilol  12.5 mg Oral BID WC  . ezetimibe  10 mg Oral QHS  . furosemide  20 mg Oral Daily  . levothyroxine  200 mcg Oral QAC breakfast  . lisinopril  2.5 mg Oral BID  . pantoprazole  40 mg Oral BID  . potassium chloride SA  20 mEq Oral Daily  . pregabalin  50 mg Oral TID  . Warfarin - Pharmacist Dosing Inpatient   Does not apply q1800      . sodium chloride Stopped (03/03/12 1955)    Physical Exam: The patient appears to be in no distress.  Head and neck exam reveals that the pupils are equal and reactive.  The extraocular movements are full.  There is no scleral icterus.  Mouth and pharynx are benign.  No lymphadenopathy.  No carotid bruits.  The jugular venous pressure is normal.  Thyroid is not enlarged or tender.  Chest is clear to percussion and auscultation.  No rales or rhonchi.  Expansion of the chest is symmetrical.  Heart reveals no abnormal lift or heave.  First and second heart sounds are normal.  There is no murmur gallop rub or click.  The abdomen is soft and nontender.  Bowel sounds are normoactive.  There is no hepatosplenomegaly or mass.  There  are no abdominal bruits.  Extremities reveal no phlebitis or edema.  Pedal pulses are good.  There is no cyanosis or clubbing.  Neurologic exam is normal strength and no lateralizing weakness.  No sensory deficits.  Integument reveals no rash  Lab Results:  Basename 03/02/12 2010 03/02/12 1745  WBC -- 4.6  HGB 13.6 13.3  PLT -- 161    Basename 03/02/12 2010 03/02/12 1745  NA 142 140  K 4.2 4.2  CL 106 103  CO2 -- 22  GLUCOSE 77 91  BUN 14 13  CREATININE 1.20* 1.19*    Basename 03/03/12 0900 03/03/12 0236  TROPONINI <0.30 <0.30   Hepatic Function Panel  Basename 03/02/12 1745  PROT 6.5  ALBUMIN 3.3*  AST 131*  ALT 91*  ALKPHOS 80  BILITOT 0.5  BILIDIR --  IBILI --   No results found for this basename: CHOL in the last 72 hours No results found for this basename: PROTIME in the last 72 hours  Imaging: Imaging results have been reviewed  Cardiac Studies: Telemetry shows sinus bradycardia. Assessment/Plan:  Patient Active Hospital Problem List: Chest pain (03/02/2012)   Assessment: No chest pain   Plan: Follow enzymes.  EKG shows no acute changes. Troponin yesterday was normal. AICD discharge (  03/02/2012)   Assessment: She has a QUALCOMM: Interrogation showed appropriate shock for SVT which accelerated into VT. Hypotension   Assessment: Asymptomatic    Plan: parameters for coreg and lisinopril.  Chronic systolic CHF     Will give lasix 20 mg po today.           Hypothyroidism     Assessment: TSH elevated 23.286     Plan:   Continue synthroid.   May be related to chronic amio. FT4 is normal   LOS: 2 days    Cassell Clement 03/04/2012, 11:07 AM

## 2012-03-05 ENCOUNTER — Encounter (HOSPITAL_COMMUNITY): Payer: Self-pay | Admitting: Internal Medicine

## 2012-03-05 DIAGNOSIS — R001 Bradycardia, unspecified: Secondary | ICD-10-CM

## 2012-03-05 DIAGNOSIS — I4892 Unspecified atrial flutter: Principal | ICD-10-CM

## 2012-03-05 DIAGNOSIS — I495 Sick sinus syndrome: Secondary | ICD-10-CM

## 2012-03-05 LAB — PROTIME-INR: Prothrombin Time: 19.5 seconds — ABNORMAL HIGH (ref 11.6–15.2)

## 2012-03-05 MED ORDER — WARFARIN SODIUM 5 MG PO TABS
5.0000 mg | ORAL_TABLET | Freq: Once | ORAL | Status: AC
  Administered 2012-03-05: 5 mg via ORAL
  Filled 2012-03-05: qty 1

## 2012-03-05 NOTE — Progress Notes (Signed)
Patient is currently active with [facility] Care Management for chronic disease management services.  Patient has been engaged by a [employer].  Our community based plan of care has focused on cardiac disease management, medication procurement/adherence, and community resource support  Patient will receive a post discharge transition of care call and will be evaluated for monthly home visits for assessments and disease process education. Of note, [facility] Care Management services does not replace or interfere with any services that are arranged by inpatient case management or social work.  For additional questions or referrals please contact Anibal Henderson BSN RN [facility] Liaison at [phone].

## 2012-03-05 NOTE — Progress Notes (Signed)
Getting an EP opinion this AM. May be eligible for discharge later today.

## 2012-03-05 NOTE — Consult Note (Addendum)
ELECTROPHYSIOLOGY CONSULT NOTE  Patient ID: [name], MRN: [id number], DOB/AGE: [id number] 62 y.o. Admit date: 03/02/2012 Date of Consult: 03/05/2012  Primary Physician: Sanda Linger, MD Primary Cardiologist: HS  Chief Complaint: ICD discharge   HPI [name] is a 61 y.o. female  seen at the request of Dr. Katrinka Blazing; she is a previous patient of Dr. Tennant/Hochrein.  She has a history of an ICD implanted for primary prevention in 2005 for ischemic heart disease and underwent generator replacement 2006 because of a defective device. I met her again for the first time in 2012 following hospitalization for inappropriate shocks occurring in the context of atrial fibrillation temporally associated with takedown of a colostomy and hypokalemia. Previously she had been intolerant of amiodarone but it was resumed and she continues to take it for right now without difficulty.    She was admitted on Friday following ICD discharge. These occurred without warning. She been having symptoms of back pain and fatigue for 2 weeks, but from the evaluation of the device and rhythm abdomen one on only for about 12 minutes and she noted no immediate antecedent symptom  She has a history of coronary artery disease with prior anterior wall MI treated with LAD stenting in August2003 and had repeat stenting of LAD in its 2004;  last catheterization 2012 demonstrated patent stent to the LAD is patent stent to OM1 was otherwise nonobstructive disease echocardiogram 2013 EF 25-30%.  Functional capacity late has been quite limited prognosis secondary to her back    Past Medical History  Diagnosis Date  . Spinal stenosis   . Numbness of foot     left foot  . Diverticulitis     Status post partial colectomy 1/12 with reversal in July 2012  . Colitis, ischemic   . DJD (degenerative joint disease)     History of multiple surgeries to the back, shoulder and knee  . Ischemic cardiomyopathy     Echo  06/16/10: EF 25-30%, anteroseptal and apical hypokinesis, moderate AI, mild MR  . Chronic systolic heart failure   . CAD (coronary artery disease)     a.  Ant MI 8/03 with stenting of the LAD;  b. staged PCI with Cypher DES to OM1 8/03;  c. s/p Cypher DES to LAD 7/04;  d.  multiple cardiac caths in past (chronically abnl ECG);    e. cardiac catheterization 3/12: LAD stent patent, distal LAD 40-50%, small ostial D1 80%, ostial D2 60%, OM-1 stent patent (20-30%), proximal RCA 50%, proximal to mid RCA 40-50%; f. cath 02/17/2011 - nonobs  . LV (left ventricular) mural thrombus     Chronic Coumadin therapy  . CKD (chronic kidney disease), stage III     creatinine:  1.3 in 4/12;    . Tobacco abuse     history  . GERD (gastroesophageal reflux disease)   . Hypertension   . Lower GI bleed     August 2011  . Hypothyroidism   . Aortic insufficiency   . Pseudoaneurysm     History of, right forearm  . Hyperlipidemia   . OA (osteoarthritis)   . Abnormal EKG     Chronically abnormal EKG.   . Spinal stenosis   . Numbness of foot     Left foot  . Compression fracture 07/04/11    L2  . Anterior myocardial infarction 10/2001  . SVT (supraventricular tachycardia)     ? h/o AFib;  patient on long term amiodarone therapy  . PAF (  paroxysmal atrial fibrillation)   . Anatomical narrow angle of right eye   . Heart murmur     "age 38-19"  . Shortness of breath     "at times; related to CHF"  . Shortness of breath on exertion   . 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 Graciela Husbands.   [name] AICD (automatic cardioverter/defibrillator) present 10/2004    explant; implant  . Blood transfusion   . Anemia   . H/O hiatal hernia   . Stroke     History of TIA and possible CVA; hospitalized 2004; on coumadin  . Systolic heart failure       Surgical History:  Past Surgical History  Procedure Date  . Back surgery   . Coronary angioplasty with stent placement 10/2001; 09/2002  .  Cardiac defibrillator placement 07/2003; 10/2004  . Lumbar laminectomy/decompression microdiscectomy 10/2001    L5-S1/E-chart  . Knee arthroscopy     left  . Thyroidectomy 1970's  . Total knee arthroplasty 11/2003    left  . X-stop implantation 12/2004    L3-4; L4-5  . Fixation kyphoplasty thoracic spine 07/2007    T3, 4, 6 compression fractures  . Shoulder open rotator cuff repair 04/2008    right  . Lumbar laminectomy/decompression microdiscectomy 01/2010  . Colectomy 03/2010    sigmoid left  . Colostomy 03/2010    transverse  . Peripherally inserted central catheter insertion 03/2010    removed upon discharge  . Colostomy closure 12/2010    reversal  . Tonsillectomy and adenoidectomy 1969  . Appendectomy 03/2010  . Cataract extraction     right     Home Meds: Prior to Admission medications   Medication Sig Start Date End Date Taking? Authorizing Provider  amiodarone (PACERONE) 200 MG tablet Take 200 mg by mouth daily.   Yes Historical Provider, MD  aspirin EC 81 MG tablet Take 81 mg by mouth daily.   Yes Historical Provider, MD  busPIRone (BUSPAR) 15 MG tablet Take 15 mg by mouth daily as needed. For mood stabilization. 05/23/11 05/22/12 Yes Romero Belling, MD  calcium-vitamin D (OSCAL WITH D) 500-200 MG-UNIT per tablet Take 1 tablet by mouth 2 (two) times daily with a meal. 07/15/11 07/14/12 Yes Mcarthur Rossetti Angiulli, PA  carvedilol (COREG) 12.5 MG tablet Take 1 tablet (12.5 mg total) by mouth 2 (two) times daily with a meal. 09/16/11 09/15/12 Yes Ok Anis, NP  cyclobenzaprine (FLEXERIL) 5 MG tablet Take 5 mg by mouth 2 (two) times daily as needed. For muscle spasms.   Yes Historical Provider, MD  ezetimibe (ZETIA) 10 MG tablet Take 10 mg by mouth at bedtime.    Yes Rollene Rotunda, MD  fexofenadine (ALLEGRA) 180 MG tablet Take 180 mg by mouth daily.    Yes Historical Provider, MD  fish oil-omega-3 fatty acids 1000 MG capsule Take 1,000 mg by mouth daily.     Yes Historical Provider, MD   fluticasone (FLONASE) 50 MCG/ACT nasal spray Place 2 sprays into the nose daily as needed. For allergies   Yes Historical Provider, MD  folic acid-pyridoxine-cyancobalamin (FOLTX) 2.5-25-2 MG TABS Take 1 tablet by mouth daily.     Yes Historical Provider, MD  furosemide (LASIX) 20 MG tablet Take 20 mg by mouth daily.   Yes Historical Provider, MD  HYDROcodone-acetaminophen (NORCO/VICODIN) 5-325 MG per tablet Take 1 tablet by mouth every 6 (six) hours as needed for pain. 02/28/12  Yes Hanley Seamen, MD  levothyroxine (  SYNTHROID, LEVOTHROID) 200 MCG tablet Take 1 tablet (200 mcg total) by mouth daily. 09/19/11 09/18/12 Yes Rosalio Macadamia, NP  lisinopril (PRINIVIL,ZESTRIL) 5 MG tablet Take 2.5 mg by mouth 2 (two) times daily. 10/03/11 10/02/12 Yes Rosalio Macadamia, NP  methocarbamol (ROBAXIN) 500 MG tablet Take 500 mg by mouth 4 (four) times daily as needed. For spasms    Yes Historical Provider, MD  nitroGLYCERIN (NITROSTAT) 0.4 MG SL tablet Place 0.4 mg under the tongue every 5 (five) minutes as needed. For chest pain   Yes Historical Provider, MD  pantoprazole (PROTONIX) 40 MG tablet Take 40 mg by mouth 2 (two) times daily.    Yes Historical Provider, MD  polyethylene glycol (MIRALAX / GLYCOLAX) packet Take 17 g by mouth daily as needed. For constipation   Yes Historical Provider, MD  potassium chloride SA (K-DUR,KLOR-CON) 20 MEQ tablet Take 20 mEq by mouth daily.   Yes Historical Provider, MD  pregabalin (LYRICA) 50 MG capsule Take 1 capsule (50 mg total) by mouth 3 (three) times daily. 01/03/12  Yes Etta Grandchild, MD  raloxifene (EVISTA) 60 MG tablet Take 60 mg by mouth daily.   Yes Historical Provider, MD  warfarin (COUMADIN) 3 MG tablet Take 3 mg by mouth at bedtime.    Yes Rollene Rotunda, MD    Inpatient Medications:    . amiodarone  200 mg Oral Daily  . aspirin EC  81 mg Oral Daily  . calcium-vitamin D  1 tablet Oral BID WC  . carvedilol  12.5 mg Oral BID WC  . ezetimibe  10 mg Oral QHS  .  furosemide  20 mg Oral Daily  . levothyroxine  200 mcg Oral QAC breakfast  . lisinopril  2.5 mg Oral BID  . pantoprazole  40 mg Oral BID  . potassium chloride SA  20 mEq Oral Daily  . pregabalin  50 mg Oral TID  . Warfarin - Pharmacist Dosing Inpatient   Does not apply q1800    Allergies:  Allergies  Allergen Reactions  . Lansoprazole Nausea Only  . Penicillins Swelling    Throat swells  . Statins Other (See Comments)    cramps  . Lactose Intolerance (Gi) Diarrhea and Other (See Comments)    Patient reports abdominal cramping and diarrhea with lactose products.     History   Social History  . Marital Status: Widowed    Spouse Name: N/A    Number of Children: N/A  . Years of Education: N/A   Occupational History  . Not on file.   Social History Main Topics  . Smoking status: Former Smoker -- 0.1 packs/day for 33 years    Types: Cigarettes    Quit date: 04/21/2001  . Smokeless tobacco: Never Used     Comment: "smoked ~ 1 pack/month"  . Alcohol Use: No     Comment: 07/05/11 "occasionally; last time was glass of wine last week"  . Drug Use: No  . Sexually Active: Yes   Other Topics Concern  . Not on file   Social History Narrative  . No narrative on file     Family History  Problem Relation Age of Onset  . Diabetes Mother   . Diabetes Brother   . Arthritis      family history  . Prostate cancer      Family History     ROS:  Please see the history of present illness.     All other systems reviewed and negative.  Physical Exam:  Blood pressure 100/49, pulse 47, temperature 98.2 F (36.8 C), temperature source Oral, resp. rate 16, height 5\' 6"  (1.676 m), weight 154 lb 8.7 oz (70.1 kg), SpO2 99.00%. General: Well developed, well nourished female in no acute distress. Head: Normocephalic, atraumatic, sclera non-icteric, no xanthomas, nares are without discharge. Lymph Nodes:  none Back: with kyphosis , no CVA tendersness Neck: Negative for carotid bruits.  JVD not elevated. Lungs: Clear bilaterally to auscultation without wheezes, rales, or rhonchi. Breathing is unlabored. Heart: RRR with S1 S  2/6 systolic   murmur , rubs, or gallops appreciated. Abdomen: Soft, non-tender, non-distended with normoactive bowel sounds. No hepatomegaly. No rebound/guarding. No obvious abdominal masses. Msk:  Strength and tone appear normal for age. Extremities: No clubbing or cyanosis. no edema.  Distal pedal pulses are 2+ and equal bilaterally. Skin: Warm and Dry Neuro: Alert and oriented X 3. CN III-XII intact Grossly normal sensory and motor function . Psych:  Responds to questions appropriately with a normal affect.      Labs: Cardiac Enzymes  Basename 03/03/12 0900 03/03/12 0236 03/02/12 2033  CKTOTAL -- -- --  CKMB -- -- --  TROPONINI <0.30 <0.30 <0.30   CBC Lab Results  Component Value Date   WBC 4.6 03/02/2012   HGB 13.6 03/02/2012   HCT 40.0 03/02/2012   MCV 97.3 03/02/2012   PLT 161 03/02/2012   PROTIME:  Basename 03/05/12 0525 03/04/12 0700 03/03/12 0529  LABPROT 19.5* 19.9* 18.3*  INR 1.71* 1.76* 1.57*   Chemistry  Lab 03/02/12 2010 03/02/12 1745  NA 142 --  K 4.2 --  CL 106 --  CO2 -- 22  BUN 14 --  CREATININE 1.20* --  CALCIUM -- 8.3*  PROT -- 6.5  BILITOT -- 0.5  ALKPHOS -- 80  ALT -- 91*  AST -- 131*  GLUCOSE 77 --   Lipids Lab Results  Component Value Date   CHOL 225* 12/15/2011   HDL 87.20 12/15/2011   LDLCALC 94 02/18/2011   TRIG 100.0 12/15/2011   BNP Pro B Natriuretic peptide (BNP)  Date/Time Value Range Status  03/02/2012  8:33 PM 1886.0* 0 - 125 pg/mL Final  10/24/2011 11:32 AM 159.0* 0.0 - 100.0 pg/mL Final  09/19/2011  3:18 PM 178.0* 0.0 - 100.0 pg/mL Final  09/14/2011 11:49 AM 2120.0* 0 - 125 pg/mL Final   Miscellaneous Lab Results  Component Value Date   DDIMER 0.59* 02/17/2011    Radiology/Studies:  Dg Chest 2 View  02/27/2012  *RADIOLOGY REPORT*  Clinical Data: The upper back pain.  Vomiting.   Dysuria.  Chest pain.  CHEST - 2 VIEW  Comparison: 09/14/2011  Findings: Shallow inspiration.  Stable appearance of cardiac pacemaker.  Borderline heart size with normal pulmonary vascularity.  Linear fibrosis or atelectasis in the right lung base.  No focal airspace consolidation.  No blunting of costophrenic angles.  No pneumothorax.  Coronary artery stents. Post kyphoplasty changes in the thoracic spine.  IMPRESSION: No evidence of active pulmonary disease.   Original Report Authenticated By: Burman Nieves, M.D.    Dg Thoracic Spine 2 View  02/21/2012  *RADIOLOGY REPORT*  Clinical Data: Lower right thoracic and upper right lumbar pain for 2 days.  No known injury.  THORACIC SPINE - 2 VIEW  Comparison: Prior chest radiographs ranging from 06/17/2010 through 09/14/2011.  Findings: There are stable findings at T3, T4 and T6 status post spinal augmentation. An old fracture involving the superior endplate of T9 is stable.  There is no evidence of acute fracture or paraspinal hematoma.  Left subclavian AICD and coronary artery stents are noted.  IMPRESSION: Stable thoracic compression deformities and post spinal augmentation findings.  No evidence of acute fracture.   Original Report Authenticated By: Carey Bullocks, M.D.    Dg Lumbar Spine 2-3 Views  02/21/2012  *RADIOLOGY REPORT*  Clinical Data: Lower right thoracic and upper right lumbar pain for 2 days.  No known injury.  LUMBAR SPINE - 2-3 VIEW  Comparison: 01/27/2012 radiographs.  Findings: Patient is status post L3-S1 fusion with bilateral pedicle screws, interconnecting rods and interbody spacers.  The hardware appears unchanged.  There appears to be some chronic loosening of the left S1 pedicle screw.  A superior endplate compression deformity at L2 is unchanged, resulting in approximately 50% loss of vertebral body height.  No acute fractures are identified.  There is a stable anterolisthesis at L5- S1.  IMPRESSION: Stable appearance of the lumbar  spine status post L3-S1 fusion. Chronic L2 compression deformity appears unchanged.   Original Report Authenticated By: Carey Bullocks, M.D.    US Abdomen Complete  02/27/2012  *RADIOLOGY REPORT*  Clinical Data:  Pain.  History of fatty liver.  Nausea and vomiting.  COMPLETE ABDOMINAL ULTRASOUND  Comparison:  CT abdomen and pelvis 03/20/2010  Findings:  Gallbladder:  There is layering sludge in the gallbladder.  No discrete stones are visualized.  No gallbladder wall thickening or pericholecystic edema.  Murphy's sign is negative.  Common bile duct:  Normal caliber with measured diameter of 6.9 mm.  Liver:  Diffusely increased hepatic parenchymal echotexture consistent with diffuse fatty infiltration.  No focal lesions are appreciated.  IVC:  Appears normal.  Pancreas:  Incomplete visualization of the pancreas due to overlying bowel gas.  Spleen:  Spleen length measures 5.5 cm.  Normal parenchymal echotexture.  Right Kidney:  Right kidney measures 8.7 cm length.  Diffuse parenchymal atrophy.  No hydronephrosis.  Left Kidney:  Left kidney measures 7.2 cm length.  Diffuse parenchymal atrophy.  No hydronephrosis.  Abdominal aorta:  No aneurysm identified.  IMPRESSION: Sludge in the gallbladder without visualized stone or wall thickening.  Diffuse fatty infiltration of the liver.  Bilateral renal parenchymal atrophy suggesting chronic medical renal disease.   Original Report Authenticated By: Burman Nieves, M.D.    Ct Abdomen Pelvis W Contrast  02/28/2012  *RADIOLOGY REPORT*  Clinical Data: Right flank pain and dysuria today.  Right upper quadrant pain.  Back pain.  CT ABDOMEN AND PELVIS WITH CONTRAST  Technique:  Multidetector CT imaging of the abdomen and pelvis was performed following the standard protocol during bolus administration of intravenous contrast.  Contrast: 80mL OMNIPAQUE IOHEXOL 300 MG/ML  SOLN  Comparison: 03/20/2010  Findings: Atelectasis in the lung bases.  The liver, spleen, gallbladder,  pancreas, adrenal glands, kidneys, and retroperitoneal lymph nodes are unremarkable.  Calcification of the abdominal aorta and branch vessels without aneurysm.  The stomach, small bowel, and colon are not abnormally distended.  No significant wall thickening.  No free air or free fluid in the abdomen.  Pelvis:  Uterus and adnexal structures are not enlarged.  The bladder is decompressed.  No free or loculated pelvic fluid collections.  The appendix is probably surgically absent.  No evidence of diverticulitis.  There has probably been partial resection of the colon.  No significant pelvic lymphadenopathy. Extensive degenerative and postoperative changes in the lumbar spine. Superior endplate compression of L1 and anterior compression of L2 have developed since the previous  CT study but were present on prior plain films from 02/21/2012.  IMPRESSION: No acute process demonstrated in the abdomen or pelvis.   Original Report Authenticated By: Burman Nieves, M.D.     EKG: Sinus at 47 Intervals 20/10/44 Poor R wave progression consistent and her wall Mi    ICD interrogation atrial flutter with ultimate one-to-one conduction resulting in inappropriate therapy   Assessment and Plan:   Patient Active Hospital Problem List: Chest pain (03/02/2012)   ICD Atrial fib/flutter Amio for above Sinus brady  The patient has tachybradycardia syndrome atrial fibrillation previously, and now with amiodarone associated paroxysmal atrial flutter with a relatively slow atrial rate resulting in one-to-one conduction and inappropriate ICD discharge.  Her device is approaching ERI and at that juncture it may make sense to upgrade her device to dual-chamber device although rate histogram information interrogated currently does not speak to a need for upgrade at this time. I would further augment her carvedilol dosing and we can follow heart rate excursion of her device. Currently her functional limitations related more to  her back into her heart if the latter becomes clearly a problem addressing chronotropic incompetence emergency would be appropriate  I have reprogrammed her device to exclude any heart rate slower than 200 which is faster than her recurrent atrial flutter rate.  In addition, sustained rate duration has been inactivated appropriately.  Her atrial arrhythmias are relatively infrequent. She had a couple hours in December and a couple of hours in October that would not modulate her amiodarone dosing at this time.  I will arrange followup in the next 10-12 weeks with me she will follow up with Dr. Katrinka Blazing I'm sure in the interim Sherryl Manges

## 2012-03-05 NOTE — Progress Notes (Signed)
ANTICOAGULATION CONSULT NOTE - Follow Up Consult  Pharmacy Consult for Coumadin Indication: atrial fibrillation and hx LV thrombus  Allergies  Allergen Reactions  . Lansoprazole Nausea Only  . Penicillins Swelling    Throat swells  . Statins Other (See Comments)    cramps  . Lactose Intolerance (Gi) Diarrhea and Other (See Comments)    Patient reports abdominal cramping and diarrhea with lactose products.    Vital Signs: Temp: 98.4 F (36.9 C) (12/16 1226) Temp src: Oral (12/16 1226) BP: 100/49 mmHg (12/16 0748) Pulse Rate: 47  (12/16 0835)  Labs:  Basename 03/05/12 0525 03/04/12 0700 03/03/12 0900 03/03/12 0529 03/03/12 0236 03/02/12 2033 03/02/12 2010 03/02/12 1745  HGB -- -- -- -- -- -- 13.6 13.3  HCT -- -- -- -- -- -- 40.0 39.7  PLT -- -- -- -- -- -- -- 161  APTT -- -- -- -- -- -- -- 31  LABPROT 19.5* 19.9* -- 18.3* -- -- -- --  INR 1.71* 1.76* -- 1.57* -- -- -- --  HEPARINUNFRC -- -- -- -- -- -- -- --  CREATININE -- -- -- -- -- -- 1.20* 1.19*  CKTOTAL -- -- -- -- -- -- -- --  CKMB -- -- -- -- -- -- -- --  TROPONINI -- -- <0.30 -- <0.30 <0.30 -- --    Estimated Creatinine Clearance: 45.5 ml/min (by C-G formula based on Cr of 1.2).  Assessment: 61yof continues on coumadin with a subtherapeutic INR despite receiving 3 days of a higher dose. No CBC today. No bleeding noted. Continues on amiodarone as pta.  Goal of Therapy:  INR 2-3 Monitor platelets by anticoagulation protocol: Yes   Plan:  1) Increase coumadin to 5mg  x 1 tonight 2) Follow up INR in AM  Fredrik Rigger 03/05/2012,1:01 PM

## 2012-03-05 NOTE — Progress Notes (Signed)
Pt to be d/c'd home.   Pt up moving about in room.  States she feels very weak,  Very unsteady gait.   Pt having to hold on to self-nurse to keep her balance.  Orthostatics normal.

## 2012-03-05 NOTE — Progress Notes (Signed)
Patient Name: [name] Date of Encounter: 03/05/2012    SUBJECTIVE: Called by the nurse stating the patient is weak and unable to go home. Discharge orders were written earlier this afternoon. She apparently had 2 ER visits on the 5 days prior to admission due to back and leg pain. This has been a chronic and disabling condition. She has no cardiac complaints. She lives alone.  TELEMETRY:  V paced Filed Vitals:   03/05/12 0835 03/05/12 1226 03/05/12 1230 03/05/12 1608  BP:   107/53 98/51  Pulse: 47   48  Temp: 98.2 F (36.8 C) 98.4 F (36.9 C)  97.2 F (36.2 C)  TempSrc: Oral Oral  Oral  Resp:    16  Height:      Weight:      SpO2:    100%    Intake/Output Summary (Last 24 hours) at 03/05/12 1732 Last data filed at 03/05/12 0900  Gross per 24 hour  Intake    840 ml  Output   1850 ml  Net  -1010 ml    LABS: Basic Metabolic Panel:  Basename 03/02/12 2010 03/02/12 1745  NA 142 140  K 4.2 4.2  CL 106 103  CO2 -- 22  GLUCOSE 77 91  BUN 14 13  CREATININE 1.20* 1.19*  CALCIUM -- 8.3*  MG -- --  PHOS -- --   CBC:  Basename 03/02/12 2010 03/02/12 1745  WBC -- 4.6  NEUTROABS -- --  HGB 13.6 13.3  HCT 40.0 39.7  MCV -- 97.3  PLT -- 161   Cardiac Enzymes:  Basename 03/03/12 0900 03/03/12 0236 03/02/12 2033  CKTOTAL -- -- --  CKMB -- -- --  CKMBINDEX -- -- --  TROPONINI <0.30 <0.30 <0.30   BNP    Component Value Date/Time   PROBNP 1886.0* 03/02/2012 2033   Radiology/Studies:   No x-ray done on admission but an ER study done of 02/27/2012 is unremarkable.  Physical Exam: Blood pressure 98/51, pulse 48, temperature 97.2 F (36.2 C), temperature source Oral, resp. rate 16, height 5\' 6"  (1.676 m), weight 70.1 kg (154 lb 8.7 oz), SpO2 100.00%. Weight change: 0 kg (0 lb)   Chest is clear to auscultation  Cardiac exam reveals a 2/6 systolic murmur at the left base.  There is no edema.  ASSESSMENT:  1. AICD discharge for SVT. See the EF consult  obtained today.  2. Ischemic cardiomyopathy with low BP likely related to over diuresis  3. Musculoskeletal syndrome with lumbar disc disease and chronic back pain with opiate dependence.  4. CAD, stable without chest pian or MI   Plan:  1. Cancel discharge 2. PT /OT consult 3. Case management assistance 4. Hold diuretic for now.  Selinda Eon 03/05/2012, 5:32 PM

## 2012-03-06 DIAGNOSIS — N183 Chronic kidney disease, stage 3 unspecified: Secondary | ICD-10-CM | POA: Diagnosis not present

## 2012-03-06 LAB — PROTIME-INR
INR: 2.04 — ABNORMAL HIGH (ref 0.00–1.49)
Prothrombin Time: 22.2 seconds — ABNORMAL HIGH (ref 11.6–15.2)

## 2012-03-06 LAB — BASIC METABOLIC PANEL
CO2: 30 mEq/L (ref 19–32)
Calcium: 8.8 mg/dL (ref 8.4–10.5)
Calcium: 9 mg/dL (ref 8.4–10.5)
GFR calc Af Amer: 46 mL/min — ABNORMAL LOW (ref >90)
GFR calc Af Amer: 49 mL/min — ABNORMAL LOW (ref >90)
GFR calc non Af Amer: 42 mL/min — ABNORMAL LOW (ref >90)
Glucose, Bld: 72 mg/dL (ref 70–99)
Potassium: 4.3 mEq/L (ref 3.5–5.1)
Sodium: 137 mEq/L (ref 135–145)
Sodium: 137 mEq/L (ref 135–145)

## 2012-03-06 MED ORDER — WARFARIN SODIUM 3 MG PO TABS
3.0000 mg | ORAL_TABLET | Freq: Once | ORAL | Status: DC
  Filled 2012-03-06: qty 1

## 2012-03-06 MED ORDER — FUROSEMIDE 20 MG PO TABS: 20.0000 mg | ORAL_TABLET | Freq: Every day | ORAL | Status: DC

## 2012-03-06 MED ORDER — LISINOPRIL 2.5 MG PO TABS
2.5000 mg | ORAL_TABLET | Freq: Two times a day (BID) | ORAL | Status: DC
  Filled 2012-03-06 (×2): qty 1

## 2012-03-06 MED ORDER — SODIUM CHLORIDE 0.9 % IV SOLN
INTRAVENOUS | Status: AC
  Administered 2012-03-06: 09:00:00 via INTRAVENOUS

## 2012-03-06 MED ORDER — WARFARIN SODIUM 3 MG PO TABS: ORAL_TABLET | ORAL | Status: DC

## 2012-03-06 NOTE — Progress Notes (Signed)
ANTICOAGULATION CONSULT NOTE - Follow Up Consult  Pharmacy Consult for Coumadin Indication: atrial fibrillation and hx LV thrombus  Allergies  Allergen Reactions  . Lansoprazole Nausea Only  . Penicillins Swelling    Throat swells  . Statins Other (See Comments)    cramps  . Lactose Intolerance (Gi) Diarrhea and Other (See Comments)    Patient reports abdominal cramping and diarrhea with lactose products.    Vital Signs: Temp: 98 F (36.7 C) (12/17 1200) Temp src: Oral (12/17 1200) BP: 102/56 mmHg (12/17 0800) Pulse Rate: 48  (12/17 0800)  Labs:  Basename 03/06/12 1119 03/06/12 0500 03/05/12 0525 03/04/12 0700  HGB -- -- -- --  HCT -- -- -- --  PLT -- -- -- --  APTT -- -- -- --  LABPROT -- 22.2* 19.5* 19.9*  INR -- 2.04* 1.71* 1.76*  HEPARINUNFRC -- -- -- --  CREATININE 1.33* 1.40* -- --  CKTOTAL -- -- -- --  CKMB -- -- -- --  TROPONINI -- -- -- --    Estimated Creatinine Clearance: 41.1 ml/min (by C-G formula based on Cr of 1.33).  Assessment: 61 y/o female patient on chronic coumadin for h/o afib with LV thrombus. INR therapeutic today after dose increase, will resume home dose. No CBC today. No bleeding noted. Continues on amiodarone as pta.  Goal of Therapy:  INR 2-3 Monitor platelets by anticoagulation protocol: Yes   Plan:  Coumadin 3mg  today and f/u daily protime.  Verlene Mayer, PharmD, BCPS Pager [phone] 03/06/2012,1:55 PM

## 2012-03-06 NOTE — Evaluation (Signed)
Occupational Therapy Evaluation and Discharge Patient Details Name: [name] MRN: [id number] DOB: 10/21/1950 Today's Date: 03/06/2012 Time: 4098-1191 OT Time Calculation (min): 14 min  OT Assessment / Plan / Recommendation Clinical Impression  This 61 yo female admitted with chest pain, AICD firing, and back pain presents to acute OT at an I to Mod I level. No futher OT needs, will sign off.    OT Assessment  Patient does not need any further OT services    Follow Up Recommendations  No OT follow up       Equipment Recommendations  None recommended by OT          Precautions / Restrictions Precautions Precautions: Back Precaution Comments: secondary to chronic back pain and prior compression fx, not strict precaution   Pertinent Vitals/Pain Back pain--not rated    ADL  Transfers/Ambulation Related to ADLs: Mod I to I level ADL Comments: Pt at in Mod I to I level with all BADLs        Visit Information  Last OT Received On: 03/06/12 Assistance Needed: +1 PT/OT Co-Evaluation/Treatment: Yes (partial)    Subjective Data  Subjective: I feel much better today, now my lower back still hurts, but I feel better. Patient Stated Goal: Go home today   Prior Functioning     Home Living Lives With: Alone Available Help at Discharge: Family;Available PRN/intermittently Type of Home: House Home Access: Stairs to enter [employer] of Steps: 4 Entrance Stairs-Rails: Right Home Layout: One level Bathroom Shower/Tub:  (walk in tub) Bathroom Toilet: Handicapped height Bathroom Accessibility: Yes How Accessible: Accessible via walker Home Adaptive Equipment: Walker - rolling;Straight cane Prior Function Level of Independence: Independent Able to Take Stairs?: Yes Driving: Yes Communication Communication: No difficulties Dominant Hand: Right            Cognition  Overall Cognitive Status: Appears within functional limits for tasks  assessed/performed Arousal/Alertness: Awake/alert Orientation Level: Appears intact for tasks assessed Behavior During Session: [name] for tasks performed    Extremity/Trunk Assessment Right Upper Extremity Assessment RUE ROM/Strength/Tone: Within functional levels Left Upper Extremity Assessment LUE ROM/Strength/Tone: Within functional levels     Mobility Bed Mobility Bed Mobility: Sit to Sidelying Left;Rolling Left;Left Sidelying to Sit;Sitting - Scoot to Edge of Bed Rolling Left: 7: Independent Left Sidelying to Sit: 6: Modified independent (Device/Increase time) Sitting - Scoot to Edge of Bed: 7: Independent Sit to Sidelying Left: 6: Modified independent (Device/Increase time) Transfers Transfers: Sit to Stand;Stand to Sit Sit to Stand: 6: Modified independent (Device/Increase time);With upper extremity assist;With armrests;From chair/3-in-1 Stand to Sit: 6: Modified independent (Device/Increase time);With upper extremity assist;To bed              End of Session OT - End of Session Equipment Utilized During Treatment: Gait belt (RW) Activity Tolerance: Patient tolerated treatment well Patient left: in chair Nurse Communication: Mobility status (+1 and ready to go home from therapy standpoint)       [name] [phone] 03/06/2012, 2:02 PM

## 2012-03-06 NOTE — Consult Note (Signed)
Reason for Consult:LBP Referring Physician: Marcey Persad is an 61 y.o. female.  HPI: well known to [place] -  Has L3-S1 fusion 2-3 years ago, developed L2 compression fx April this year - in hospital for cardiac issues, but c/o severe LBP - no radicular leg pain - has had xrays T/L spine  and CT abd  While here.  We recently saw pt in our office as outpatient  PMH:  Past Medical History  Diagnosis Date  . Spinal stenosis   . Numbness of foot     left foot  . Diverticulitis     Status post partial colectomy 1/12 with reversal in July 2012  . Colitis, ischemic   . DJD (degenerative joint disease)     History of multiple surgeries to the back, shoulder and knee  . Ischemic cardiomyopathy     Echo 06/16/10: EF 25-30%, anteroseptal and apical hypokinesis, moderate AI, mild MR  . Chronic systolic heart failure   . CAD (coronary artery disease)     a.  Ant MI 8/03 with stenting of the LAD;  b. staged PCI with Cypher DES to OM1 8/03;  c. s/p Cypher DES to LAD 7/04;  d.  multiple cardiac caths in past (chronically abnl ECG);    e. cardiac catheterization 3/12: LAD stent patent, distal LAD 40-50%, small ostial D1 80%, ostial D2 60%, OM-1 stent patent (20-30%), proximal RCA 50%, proximal to mid RCA 40-50%; f. cath 02/17/2011 - nonobs  . LV (left ventricular) mural thrombus     Chronic Coumadin therapy  . CKD (chronic kidney disease), stage III     creatinine:  1.3 in 4/12;    . Tobacco abuse     history  . GERD (gastroesophageal reflux disease)   . Hypertension   . Lower GI bleed     August 2011  . Hypothyroidism   . Aortic insufficiency   . Pseudoaneurysm     History of, right forearm  . Hyperlipidemia   . OA (osteoarthritis)   . Abnormal EKG     Chronically abnormal EKG.   . Spinal stenosis   . Numbness of foot     Left foot  . Compression fracture 07/04/11    L2  . Anterior myocardial infarction 10/2001  . SVT (supraventricular tachycardia)     ? h/o AFib;  patient on long  term amiodarone therapy  . PAF (paroxysmal atrial fibrillation)   . Anatomical narrow angle of right eye   . Heart murmur     "age 76-19"  . Shortness of breath     "at times; related to CHF"  . Shortness of breath on exertion   . 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 Graciela Husbands.   [name] AICD (automatic cardioverter/defibrillator) present 10/2004    explant; implant  . Blood transfusion   . Anemia   . H/O hiatal hernia   . Stroke     History of TIA and possible CVA; hospitalized 2004; on coumadin  . Systolic heart failure   . Sinus bradycardia 03/05/2012    Past Surgical History  Procedure Date  . Back surgery   . Coronary angioplasty with stent placement 10/2001; 09/2002  . Cardiac defibrillator placement 07/2003; 10/2004  . Lumbar laminectomy/decompression microdiscectomy 10/2001    L5-S1/E-chart  . Knee arthroscopy     left  . Thyroidectomy 1970's  . Total knee arthroplasty 11/2003    left  . X-stop implantation 12/2004  L3-4; L4-5  . Fixation kyphoplasty thoracic spine 07/2007    T3, 4, 6 compression fractures  . Shoulder open rotator cuff repair 04/2008    right  . Lumbar laminectomy/decompression microdiscectomy 01/2010  . Colectomy 03/2010    sigmoid left  . Colostomy 03/2010    transverse  . Peripherally inserted central catheter insertion 03/2010    removed upon discharge  . Colostomy closure 12/2010    reversal  . Tonsillectomy and adenoidectomy 1969  . Appendectomy 03/2010  . Cataract extraction     right    Family History:  Family History  Problem Relation Age of Onset  . Diabetes Mother   . Diabetes Brother   . Arthritis      family history  . Prostate cancer      Family History    Social History:  reports that she quit smoking about 10 years ago. Her smoking use included Cigarettes. She has a 3.3 pack-year smoking history. She has never used smokeless tobacco. She reports that she does not drink alcohol or use illicit  drugs.  Allergies:  Allergies  Allergen Reactions  . Lansoprazole Nausea Only  . Penicillins Swelling    Throat swells  . Statins Other (See Comments)    cramps  . Lactose Intolerance (Gi) Diarrhea and Other (See Comments)    Patient reports abdominal cramping and diarrhea with lactose products.     Medications:  Prior to Admission:  Prescriptions prior to admission  Medication Sig Dispense Refill  . amiodarone (PACERONE) 200 MG tablet Take 200 mg by mouth daily.      [name] aspirin EC 81 MG tablet Take 81 mg by mouth daily.      . busPIRone (BUSPAR) 15 MG tablet Take 15 mg by mouth daily as needed. For mood stabilization.      . calcium-vitamin D (OSCAL WITH D) 500-200 MG-UNIT per tablet Take 1 tablet by mouth 2 (two) times daily with a meal.  60 tablet  1  . carvedilol (COREG) 12.5 MG tablet Take 1 tablet (12.5 mg total) by mouth 2 (two) times daily with a meal.  60 tablet  6  . cyclobenzaprine (FLEXERIL) 5 MG tablet Take 5 mg by mouth 2 (two) times daily as needed. For muscle spasms.      [name] ezetimibe (ZETIA) 10 MG tablet Take 10 mg by mouth at bedtime.       . fexofenadine (ALLEGRA) 180 MG tablet Take 180 mg by mouth daily.       . fish oil-omega-3 fatty acids 1000 MG capsule Take 1,000 mg by mouth daily.        . fluticasone (FLONASE) 50 MCG/ACT nasal spray Place 2 sprays into the nose daily as needed. For allergies      . folic acid-pyridoxine-cyancobalamin (FOLTX) 2.5-25-2 MG TABS Take 1 tablet by mouth daily.        . furosemide (LASIX) 20 MG tablet Take 20 mg by mouth daily.      [name] levothyroxine (SYNTHROID, LEVOTHROID) 200 MCG tablet Take 1 tablet (200 mcg total) by mouth daily.  30 tablet  6  . lisinopril (PRINIVIL,ZESTRIL) 5 MG tablet Take 2.5 mg by mouth 2 (two) times daily.      . methocarbamol (ROBAXIN) 500 MG tablet Take 500 mg by mouth 4 (four) times daily as needed. For spasms       . nitroGLYCERIN (NITROSTAT) 0.4 MG SL tablet Place 0.4 mg under the tongue every 5 (five)  minutes as needed. For  chest pain      . pantoprazole (PROTONIX) 40 MG tablet Take 40 mg by mouth 2 (two) times daily.       . polyethylene glycol (MIRALAX / GLYCOLAX) packet Take 17 g by mouth daily as needed. For constipation      . potassium chloride SA (K-DUR,KLOR-CON) 20 MEQ tablet Take 20 mEq by mouth daily.      . pregabalin (LYRICA) 50 MG capsule Take 1 capsule (50 mg total) by mouth 3 (three) times daily.  90 capsule  5  . raloxifene (EVISTA) 60 MG tablet Take 60 mg by mouth daily.      [name] warfarin (COUMADIN) 3 MG tablet Take 3 mg by mouth at bedtime.       . [DISCONTINUED] HYDROcodone-acetaminophen (NORCO/VICODIN) 5-325 MG per tablet Take 1 tablet by mouth every 6 (six) hours as needed for pain.  20 tablet  0    Results for orders placed during the hospital encounter of 03/02/12 (from the past 48 hour(s))  PROTIME-INR     Status: Abnormal   Collection Time   03/05/12  5:25 AM      Component Value Range Comment   Prothrombin Time 19.5 (*) 11.6 - 15.2 seconds    INR 1.71 (*) 0.00 - 1.49   PROTIME-INR     Status: Abnormal   Collection Time   03/06/12  5:00 AM      Component Value Range Comment   Prothrombin Time 22.2 (*) 11.6 - 15.2 seconds    INR 2.04 (*) 0.00 - 1.49   BASIC METABOLIC PANEL     Status: Abnormal   Collection Time   03/06/12  5:00 AM      Component Value Range Comment   Sodium 137  135 - 145 mEq/L    Potassium 4.3  3.5 - 5.1 mEq/L    Chloride 98  96 - 112 mEq/L    CO2 30  19 - 32 mEq/L    Glucose, Bld 92  70 - 99 mg/dL    BUN 15  6 - 23 mg/dL    Creatinine, Ser 1.61 (*) 0.50 - 1.10 mg/dL    Calcium 8.8  8.4 - 09.6 mg/dL    GFR calc non Af Amer 39 (*) >90 mL/min    GFR calc Af Amer 46 (*) >90 mL/min     LUMBAR SPINE - 2-3 VIEW  Comparison: 01/27/2012 radiographs.  Findings: Patient is status post L3-S1 fusion with bilateral  pedicle screws, interconnecting rods and interbody spacers. The  hardware appears unchanged. There appears to be some chronic   loosening of the left S1 pedicle screw. A superior endplate  compression deformity at L2 is unchanged, resulting in  approximately 50% loss of vertebral body height. No acute  fractures are identified. There is a stable anterolisthesis at L5-  S1.  IMPRESSION:  Stable appearance of the lumbar spine status post L3-S1 fusion.  Chronic L2 compression deformity appears unchanged.  THORACIC SPINE - 2 VIEW  Comparison: Prior chest radiographs ranging from 06/17/2010 through  09/14/2011.  Findings: There are stable findings at T3, T4 and T6 status post  spinal augmentation. An old fracture involving the superior  endplate of T9 is stable. There is no evidence of acute fracture  or paraspinal hematoma. Left subclavian AICD and coronary artery  stents are noted.  IMPRESSION:  Stable thoracic compression deformities and post spinal  augmentation findings. No evidence of acute fracture.  CT ABDOMEN AND PELVIS WITH CONTRAST  Technique: Multidetector  CT imaging of the abdomen and pelvis was  performed following the standard protocol during bolus  administration of intravenous contrast.  Contrast: 80mL OMNIPAQUE IOHEXOL 300 MG/ML SOLN  Comparison: 03/20/2010  Findings: Atelectasis in the lung bases.  The liver, spleen, gallbladder, pancreas, adrenal glands, kidneys,  and retroperitoneal lymph nodes are unremarkable. Calcification of  the abdominal aorta and branch vessels without aneurysm. The  stomach, small bowel, and colon are not abnormally distended. No  significant wall thickening. No free air or free fluid in the  abdomen.  Pelvis: Uterus and adnexal structures are not enlarged. The  bladder is decompressed. No free or loculated pelvic fluid  collections. The appendix is probably surgically absent. No  evidence of diverticulitis. There has probably been partial  resection of the colon. No significant pelvic lymphadenopathy.  Extensive degenerative and postoperative changes in the  lumbar  spine. Superior endplate compression of L1 and anterior compression  of L2 have developed since the previous CT study but were present  on prior plain films from 02/21/2012.  IMPRESSION:  No acute process demonstrated in the abdomen or pelvis.   ROS - +neck pain  Blood pressure 102/56, pulse 48, temperature 98.4 F (36.9 C), temperature source Oral, resp. rate 18, height 5\' 6"  (1.676 m), weight 70.1 kg (154 lb 8.7 oz), SpO2 96.00%. AAOX3 , moves all 4 ext. Well - motor/sensation intact all 4 ,   Able to sit up and stand - gait defered  Assessment/Plan: PT with significant chronic spine issues with some increase in back pain - studies unchanged from previous studies - no surgical intervention needed - agree with some HH PT to assist pt - she can use her brace for the next 2-3 weeks to see if that helps.  Also  Changing to flexeril and using a medrol dose pack may be beneficial.  Pt is followed in a pain clinic and i recommend she get F/u there and F/u with [place] as scheduled  Dmauri Rosenow R, MD 03/06/2012, 11:24 AM

## 2012-03-06 NOTE — Progress Notes (Addendum)
Patient Name: [name] Date of Encounter: 03/06/2012    SUBJECTIVE: Now complaining of debilitating back pain. No dyspnea or cardiac complaints. Attempt to discharge yesterday failed due to inability to get out of bed.  TELEMETRY:  Sinus brady: Filed Vitals:   03/05/12 2321 03/05/12 2347 03/06/12 0335 03/06/12 0400  BP: 97/49  92/45   Pulse:      Temp:  97.8 F (36.6 C)  97.9 F (36.6 C)  TempSrc:  Oral  Oral  Resp:      Height:      Weight:      SpO2:  98%  95%    Intake/Output Summary (Last 24 hours) at 03/06/12 0754 Last data filed at 03/06/12 0022  Gross per 24 hour  Intake    440 ml  Output   1150 ml  Net   -710 ml    LABS: Basic Metabolic Panel:  Basename 03/06/12 0500  NA 137  K 4.3  CL 98  CO2 30  GLUCOSE 92  BUN 15  CREATININE 1.40*  CALCIUM 8.8  MG --  PHOS --   CBC: No results found for this basename: WBC:2,NEUTROABS:2,HGB:2,HCT:2,MCV:2,PLT:2 in the last 72 hours Cardiac Enzymes:  Basename 03/03/12 0900  CKTOTAL --  CKMB --  CKMBINDEX --  TROPONINI <0.30    Radiology/Studies:  No new  Physical Exam: Blood pressure 92/45, pulse 48, temperature 97.9 F (36.6 C), temperature source Oral, resp. rate 16, height 5\' 6"  (1.676 m), weight 70.1 kg (154 lb 8.7 oz), SpO2 95.00%. Weight change:    Chest is clear  Cardiac exam reveals a 1/6 systolic murmur  Extremities reveal no edema.  ASSESSMENT: 1. Chronic systolic heart failure, stable 2. Acute on chronic kidney injury due to diuresis and poor intake while in hospital 3. Sever back pain preventing ambulation and mobility. 4. AICD reprogrammed and no further management issues    Plan:  1. Transfer to telemetry 2. Dr. Phoebe Perch to see about her back 3. Case manager and PT to help with aiming toward discharge. 4. Hold AM lisinopril and Lasix  Signed, Lesleigh Noe 03/06/2012, 7:54 AM

## 2012-03-06 NOTE — Evaluation (Signed)
Physical Therapy Evaluation Patient Details Name: [name] MRN: [id number] DOB: [birth year] Today's Date: 03/06/2012 Time: 8413-2440 PT Time Calculation (min): 16 min  PT Assessment / Plan / Recommendation Clinical Impression  Pt admitted with CP after AICD firing and with acute worsening of chronic back pain. Pt presents with mobility at or very near baseline stating that back pain is really her only change from baseline. Pt able to perform all basic mobility without assist currently and encouraged to continue mobility throughout the day. Pt aware of back precautions and performs appropriately with basic mobility despite slight flexion with ambulation. At this time no further acute needs.     PT Assessment  Patent does not need any further PT services    Follow Up Recommendations  No PT follow up    Does the patient have the potential to tolerate intense rehabilitation      Barriers to Discharge        Equipment Recommendations  None recommended by PT    Recommendations for Other Services     Frequency      Precautions / Restrictions Precautions Precautions: Back Precaution Comments: secondary to chronic back pain and prior compression fx, not strict precaution   Pertinent Vitals/Pain Pt reports soreness at back but unrated No CP or dyspnea with activity      Mobility  Bed Mobility Bed Mobility: Rolling Left;Left Sidelying to Sit;Sit to Sidelying Left Rolling Left: 7: Independent Left Sidelying to Sit: 6: Modified independent (Device/Increase time);HOB flat Sitting - Scoot to Edge of Bed: 7: Independent Sit to Sidelying Left: 5: Supervision Details for Bed Mobility Assistance: cueing for placement of top arm Transfers Transfers: Sit to Stand;Stand to Sit Sit to Stand: 6: Modified independent (Device/Increase time);From bed;From chair/3-in-1 Stand to Sit: 6: Modified independent (Device/Increase time);To bed;To chair/3-in-1 Ambulation/Gait Ambulation/Gait  Assistance: 6: Modified independent (Device/Increase time) Ambulation Distance (Feet): 150 Feet Assistive device: Rolling walker Ambulation/Gait Assistance Details: Pt maintains flexed trunk with and without RW use due to back pain eases in flexion per pt. Pt educated for posture and position in RW as able. Pt ambulated 15' in room without AD Gait Pattern: Within Functional Limits;Decreased stride length Stairs: No    Shoulder Instructions     Exercises     PT Diagnosis:    PT Problem List:   PT Treatment Interventions:     PT Goals    Visit Information  Last PT Received On: 03/06/12 Assistance Needed: +1    Subjective Data  Subjective: Oh, I'm doing much better than I was Patient Stated Goal: return home today   Prior Functioning  Home Living Lives With: Alone Available Help at Discharge: Family;Available PRN/intermittently Type of Home: House Home Access: Stairs to enter [employer] of Steps: 4 Entrance Stairs-Rails: Right Home Layout: One level Bathroom Shower/Tub:  (walk in tub) Bathroom Toilet: Handicapped height Bathroom Accessibility: Yes How Accessible: Accessible via walker Home Adaptive Equipment: Walker - rolling;Straight cane Prior Function Level of Independence: Independent Able to Take Stairs?: Yes Driving: Yes Communication Communication: No difficulties Dominant Hand: Right    Cognition  Overall Cognitive Status: Appears within functional limits for tasks assessed/performed Arousal/Alertness: Awake/alert Orientation Level: Appears intact for tasks assessed Behavior During Session: [facility] for tasks performed    Extremity/Trunk Assessment Right Upper Extremity Assessment RUE ROM/Strength/Tone: Within functional levels Left Upper Extremity Assessment LUE ROM/Strength/Tone: Within functional levels Right Lower Extremity Assessment RLE ROM/Strength/Tone: [facility] for tasks assessed Left Lower Extremity Assessment LLE ROM/Strength/Tone: [facility]  for tasks  assessed Trunk Assessment Trunk Assessment: Kyphotic   Balance    End of Session PT - End of Session Equipment Utilized During Treatment: Gait belt Activity Tolerance: Patient tolerated treatment well Patient left: in chair;with call bell/phone within reach Nurse Communication: Mobility status  GP     Toney Sang Beth 03/06/2012, 2:06 PM  Delaney Meigs, PT [phone]

## 2012-03-06 NOTE — Care Management Note (Signed)
    Page 1 of 2   03/06/2012     9:57:49 AM   CARE MANAGEMENT NOTE 03/06/2012  Patient:  [name],[name]   Account Number:  [id number]  Date Initiated:  03/05/2012  Documentation initiated by:  Junius Creamer  Subjective/Objective Assessment:   adm w ch pain     Action/Plan:   lives alone, pcp dr Sanda Linger   Anticipated DC Date:     Anticipated DC Plan:    In-house referral  Clinical Social Worker      DC Planning Services  CM consult      [facility] Choice  HOME HEALTH   Choice offered to / List presented to:  C-1 Patient        HH arranged  HH-1 RN  HH-2 PT  HH-4 NURSE'S AIDE      HH agency  [facility]   Status of service:   Medicare Important Message given?   (If response is "NO", the following Medicare IM given date fields will be blank) Date Medicare IM given:   Date Additional Medicare IM given:    Discharge Disposition:  HOME W HOME HEALTH SERVICES  Per UR Regulation:  Reviewed for med. necessity/level of care/duration of stay  If discussed at Long Length of Stay Meetings, dates discussed:    Comments:  12/17 9:55a Becky Braeley Buskey rn,bsn spoke w pt. await phy ther eval. pt would like to go to cir but don't think she is candidate. spoke w her about snf fo rshort term rehab and she will await phy ther eval. she has used [facility] so she wants to use them if hhc needed. ref to mary w [facility] if needed. she has all eq and does not need any more eq.  12/16 11:50p Becky Ennifer Harston rn,bsn [phone]

## 2012-03-08 NOTE — Discharge Summary (Signed)
Patient ID: [name] MRN: [id number] DOB/AGE: Jul 28, 1950 61 y.o.  Admit date: 03/02/2012 Discharge date: 03/06/2012 Primary Discharge Diagnosis: AICD discharge for SVT/Atrial tach  Secondary Discharge Diagnosis; 1. Ischemic cardiomyopathy with stable systolic heart failure  2. Paroxysmal atrial fibrillation  3. Hypertension  4. Coronary artery disease with no evidence of myocardial infarction  5. History of CVA  6. Chronic anticoagulation therapy  7. Hypertension  8. Severe back pain and difficulty ambulating. This was an active issue during this hospital stay requiring neurosurgical consultation.  9. Spinal stenosis with prior surgery in the lumbar region  10. Chronic kidney disease, stage III  Significant Diagnostic Studies: None  Consults: 1. Dr. Sherryl Manges, electrophysiology  2. Neurosurgery, Dr. Elsworth Soho Course: The patient was admitted to the hospital after experiencing an AICD discharge while getting into her car at her neighborhood drugstore. She is brought to the hospital by EMS. Her device was interrogated. It was determined that her arrhythmia was due to a supraventricular tachycardia. The device was reprogrammed after interrogation by Dr. Sherryl Manges who follows her in the lobe our device clinic. He did not have further recommendations other than continued followup.  Unfortunately the patient remained in her bed for nearly 48 hours. On the day of presumed to discharge she was unable to stand and ambulate. This required another day of hospitalization. Her inability to stand and complained of back pain raise the possibility of spinal injury related to the AICD discharge and subsequent fall that she had. For that reason she was seen by Dr. Phoebe Perch, her neurosurgeon. He compared her prior data and exam with recurrent x-rays and exam and felt that no significant change had occurred. The patient work with physical therapy and was able to ambulate to a  point that allowed her to feel safe going home.   Discharge Exam: Blood pressure 102/56, pulse 56, temperature 98 F (36.7 C), temperature source Oral, resp. rate 16, height 5\' 6"  (1.676 m), weight 70.1 kg (154 lb 8.7 oz), SpO2 98.00%.   On exam device pocket is unremarkable. Cardiac exam reveals a slightly irregular rhythm and a soft S3 gallop. A 2 of 6 systolic murmur is heard.  The lungs were clear to auscultation and percussion.  No musculoskeletal sites of injury or trauma were identified. Labs:   Lab Results  Component Value Date   WBC 4.6 03/02/2012   HGB 13.6 03/02/2012   HCT 40.0 03/02/2012   MCV 97.3 03/02/2012   PLT 161 03/02/2012    Lab 03/06/12 1119 03/02/12 1745  NA 137 --  K 4.3 --  CL 97 --  CO2 32 --  BUN 14 --  CREATININE 1.33* --  CALCIUM 9.0 --  PROT -- 6.5  BILITOT -- 0.5  ALKPHOS -- 80  ALT -- 91*  AST -- 131*  GLUCOSE 72 --   Lab Results  Component Value Date   CKTOTAL 79 09/15/2011   CKMB 2.2 09/15/2011   TROPONINI <0.30 03/03/2012    Lab Results  Component Value Date   CHOL 225* 12/15/2011   CHOL 209* 02/18/2011   CHOL 242* 02/17/2011   Lab Results  Component Value Date   HDL 87.20 12/15/2011   HDL 119 02/18/2011   HDL 114 02/17/2011   Lab Results  Component Value Date   LDLCALC 94 02/18/2011   LDLCALC 118* 02/17/2011   LDLCALC 80 12/30/2010   Lab Results  Component Value Date   TRIG 100.0 12/15/2011  TRIG 56 02/18/2011   TRIG 49 02/17/2011   Lab Results  Component Value Date   CHOLHDL 3 12/15/2011   CHOLHDL 2.0 02/18/2011   CHOLHDL 2.1 02/17/2011   Lab Results  Component Value Date   LDLDIRECT 126.3 12/15/2011      Radiology: No new x-rays were obtained during this admission. X-rays obtained early in the month. He emergency room visits were reviewed including CT scans and chest x-ray.  EKG: Sinus bradycardia with old anteroseptal and inferior infarction noted.  FOLLOW UP PLANS AND APPOINTMENTS Discharge Orders     Future Orders Please Complete By Expires   Heart failure home health orders      Comments:   Heart Failure Follow-up Care:  Verify follow-up appointments per Patient Discharge Instructions. Confirm transportation arranged. Reconcile home medications with discharge medication list. Remove discontinued medications from use. Assist patient/caregiver to manage medications using pill box. Reinforce low sodium food selection Assessments: Vital signs and oxygen saturation at each visit. Assess home environment for safety concerns, caregiver support and availability of low-sodium foods. Consult Child psychotherapist, PT/OT, Dietitian, and CNA based on assessments. Perform comprehensive cardiopulmonary assessment. Notify MD for any change in condition or weight gain of 3 pounds in one day or 5 pounds in one week with symptoms. Daily assistance with bathing Daily Weights and Symptom Monitoring: Ensure patient has access to scales. Teach patient/caregiver to weigh daily before breakfast and after voiding using same scale and record.    Teach patient/caregiver to track weight and symptoms and when to notify Provider. Activity: Develop individualized activity plan with patient/caregiver.   Questions: Responses:   Skilled Nurse to notify MD of weight trends weekly for first 2 weeks. May fax or call: (call) OR fax to: Other see comments   Heart Failure Follow-up Care Or per Doctor (see comments)   Home Health Visits Set up telemonitoring equipment to monitor daily vital signs, weights and oxygen saturation   Obtain the following labs Basic Metabolic Panel   Lab frequency Weekly   Fax lab results to    Diet Low Sodium Heart Healthy   Fluid restrictions: 1800 mL Fluid   Diet - low sodium heart healthy      Face-to-face encounter (required for Medicare/Medicaid patients)      Comments:   I TURNER,TRACI R certify that this patient is under my care and that I, or a nurse practitioner or physician's assistant  working with me, had a face-to-face encounter that meets the physician face-to-face encounter requirements with this patient on 03/06/2012. The encounter with the patient was in whole, or in part for the following medical condition(s) which is the primary reason for home health care (List medical condition): acute on chronic systolic CHF and severe back pain with ambulation   Questions: Responses:   The encounter with the patient was in whole, or in part, for the following medical condition, which is the primary reason for home health care acute on chronic systolic CHF   I certify that, based on my findings, the following services are medically necessary home health services Nursing   My clinical findings support the need for the above services Pain interferes with ambulation/mobility   Further, I certify that my clinical findings support that this patient is homebound due to: Shortness of Breath with activity   To provide the following care/treatments Home Health Aide   Increase activity slowly          Medication List     As of 03/08/2012  1:59 PM    STOP taking these medications         HYDROcodone-acetaminophen 5-325 MG per tablet   Commonly known as: NORCO/VICODIN      TAKE these medications         amiodarone 200 MG tablet   Commonly known as: PACERONE   Take 200 mg by mouth daily.      aspirin EC 81 MG tablet   Take 81 mg by mouth daily.      busPIRone 15 MG tablet   Commonly known as: BUSPAR   Take 15 mg by mouth daily as needed. For mood stabilization.      calcium-vitamin D 500-200 MG-UNIT per tablet   Commonly known as: OSCAL WITH D   Take 1 tablet by mouth 2 (two) times daily with a meal.      carvedilol 12.5 MG tablet   Commonly known as: COREG   Take 1 tablet (12.5 mg total) by mouth 2 (two) times daily with a meal.      cyclobenzaprine 5 MG tablet   Commonly known as: FLEXERIL   Take 5 mg by mouth 2 (two) times daily as needed. For muscle spasms.       ezetimibe 10 MG tablet   Commonly known as: ZETIA   Take 10 mg by mouth at bedtime.      fexofenadine 180 MG tablet   Commonly known as: ALLEGRA   Take 180 mg by mouth daily.      fish oil-omega-3 fatty acids 1000 MG capsule   Take 1,000 mg by mouth daily.      fluticasone 50 MCG/ACT nasal spray   Commonly known as: FLONASE   Place 2 sprays into the nose daily as needed. For allergies      folic acid-pyridoxine-cyancobalamin 2.5-25-2 MG Tabs   Commonly known as: FOLTX   Take 1 tablet by mouth daily.      furosemide 20 MG tablet   Commonly known as: LASIX   Take 20 mg by mouth daily.      levothyroxine 200 MCG tablet   Commonly known as: SYNTHROID, LEVOTHROID   Take 1 tablet (200 mcg total) by mouth daily.      lisinopril 5 MG tablet   Commonly known as: PRINIVIL,ZESTRIL   Take 2.5 mg by mouth 2 (two) times daily.      methocarbamol 500 MG tablet   Commonly known as: ROBAXIN   Take 500 mg by mouth 4 (four) times daily as needed. For spasms      nitroGLYCERIN 0.4 MG SL tablet   Commonly known as: NITROSTAT   Place 0.4 mg under the tongue every 5 (five) minutes as needed. For chest pain      pantoprazole 40 MG tablet   Commonly known as: PROTONIX   Take 40 mg by mouth 2 (two) times daily.      polyethylene glycol packet   Commonly known as: MIRALAX / GLYCOLAX   Take 17 g by mouth daily as needed. For constipation      potassium chloride SA 20 MEQ tablet   Commonly known as: K-DUR,KLOR-CON   Take 20 mEq by mouth daily.      pregabalin 50 MG capsule   Commonly known as: LYRICA   Take 1 capsule (50 mg total) by mouth 3 (three) times daily.      raloxifene 60 MG tablet   Commonly known as: EVISTA   Take 60 mg by mouth daily.  warfarin 3 MG tablet   Commonly known as: COUMADIN   Take 1 tablet daily except take 1 and 1/2 tablets on Wednesday and Friday           Follow-up Information    Follow up with Sanda Linger, MD. On 03/15/2012. (1:30 PM C. Emelda Fear  for Dr Mendel Ryder)    Contact information:   [address] [place] [address] [phone]       Follow up with Lesleigh Noe, MD. On 03/19/2012. (at 9:30am)    Contact information:   [address] [place] [address] [phone]          BRING ALL MEDICATIONS WITH YOU TO FOLLOW UP APPOINTMENTS  Time spent with patient to include physician time: 30 Signed: Lesleigh Noe 03/08/2012, 1:59 PM

## 2012-03-09 NOTE — ED Notes (Signed)
Patient admitted on 12/13.

## 2012-03-17 ENCOUNTER — Emergency Department (HOSPITAL_COMMUNITY): Payer: Medicare Other

## 2012-03-17 ENCOUNTER — Emergency Department (HOSPITAL_COMMUNITY)
Admission: EM | Admit: 2012-03-17 | Discharge: 2012-03-17 | Disposition: A | Discharge status: Home / Self Care | Payer: Medicare Other | Attending: Emergency Medicine | Admitting: Emergency Medicine

## 2012-03-17 ENCOUNTER — Encounter (HOSPITAL_COMMUNITY): Payer: Self-pay | Admitting: *Deleted

## 2012-03-17 DIAGNOSIS — Z87891 Personal history of nicotine dependence: Secondary | ICD-10-CM | POA: Insufficient documentation

## 2012-03-17 DIAGNOSIS — I428 Other cardiomyopathies: Secondary | ICD-10-CM | POA: Insufficient documentation

## 2012-03-17 DIAGNOSIS — E039 Hypothyroidism, unspecified: Secondary | ICD-10-CM | POA: Insufficient documentation

## 2012-03-17 DIAGNOSIS — I5020 Unspecified systolic (congestive) heart failure: Secondary | ICD-10-CM | POA: Insufficient documentation

## 2012-03-17 DIAGNOSIS — Z8719 Personal history of other diseases of the digestive system: Secondary | ICD-10-CM | POA: Insufficient documentation

## 2012-03-17 DIAGNOSIS — E785 Hyperlipidemia, unspecified: Secondary | ICD-10-CM | POA: Insufficient documentation

## 2012-03-17 DIAGNOSIS — I129 Hypertensive chronic kidney disease with stage 1 through stage 4 chronic kidney disease, or unspecified chronic kidney disease: Secondary | ICD-10-CM | POA: Insufficient documentation

## 2012-03-17 DIAGNOSIS — R6883 Chills (without fever): Secondary | ICD-10-CM | POA: Insufficient documentation

## 2012-03-17 DIAGNOSIS — N183 Chronic kidney disease, stage 3 unspecified: Secondary | ICD-10-CM | POA: Insufficient documentation

## 2012-03-17 DIAGNOSIS — R079 Chest pain, unspecified: Secondary | ICD-10-CM

## 2012-03-17 DIAGNOSIS — Z8673 Personal history of transient ischemic attack (TIA), and cerebral infarction without residual deficits: Secondary | ICD-10-CM | POA: Insufficient documentation

## 2012-03-17 DIAGNOSIS — Z95810 Presence of automatic (implantable) cardiac defibrillator: Secondary | ICD-10-CM | POA: Insufficient documentation

## 2012-03-17 DIAGNOSIS — Z7901 Long term (current) use of anticoagulants: Secondary | ICD-10-CM | POA: Insufficient documentation

## 2012-03-17 DIAGNOSIS — I4891 Unspecified atrial fibrillation: Secondary | ICD-10-CM | POA: Insufficient documentation

## 2012-03-17 DIAGNOSIS — I252 Old myocardial infarction: Secondary | ICD-10-CM | POA: Insufficient documentation

## 2012-03-17 DIAGNOSIS — Z7982 Long term (current) use of aspirin: Secondary | ICD-10-CM | POA: Insufficient documentation

## 2012-03-17 DIAGNOSIS — R5381 Other malaise: Secondary | ICD-10-CM | POA: Insufficient documentation

## 2012-03-17 DIAGNOSIS — I2510 Atherosclerotic heart disease of native coronary artery without angina pectoris: Secondary | ICD-10-CM | POA: Insufficient documentation

## 2012-03-17 DIAGNOSIS — Z79899 Other long term (current) drug therapy: Secondary | ICD-10-CM | POA: Insufficient documentation

## 2012-03-17 DIAGNOSIS — K219 Gastro-esophageal reflux disease without esophagitis: Secondary | ICD-10-CM | POA: Insufficient documentation

## 2012-03-17 LAB — PRO B NATRIURETIC PEPTIDE: Pro B Natriuretic peptide (BNP): 1574 pg/mL — ABNORMAL HIGH (ref 0–125)

## 2012-03-17 LAB — COMPREHENSIVE METABOLIC PANEL
AST: 60 U/L — ABNORMAL HIGH (ref 0–37)
Albumin: 3.7 g/dL (ref 3.5–5.2)
Chloride: 96 mEq/L (ref 96–112)
Creatinine, Ser: 1.22 mg/dL — ABNORMAL HIGH (ref 0.50–1.10)
Total Bilirubin: 0.6 mg/dL (ref 0.3–1.2)

## 2012-03-17 LAB — CBC WITH DIFFERENTIAL/PLATELET
Basophils Absolute: 0 10*3/uL (ref 0.0–0.1)
Basophils Relative: 0 % (ref 0–1)
HCT: 37.4 % (ref 36.0–46.0)
MCHC: 32.9 g/dL (ref 30.0–36.0)
Monocytes Absolute: 0.6 10*3/uL (ref 0.1–1.0)
Neutro Abs: 4.1 10*3/uL (ref 1.7–7.7)
Neutrophils Relative %: 70 % (ref 43–77)
RDW: 16.3 % — ABNORMAL HIGH (ref 11.5–15.5)

## 2012-03-17 LAB — POCT I-STAT TROPONIN I
Troponin i, poc: 0.03 ng/mL (ref 0.00–0.08)
Troponin i, poc: 0.02 ng/mL (ref 0.00–0.08)

## 2012-03-17 LAB — APTT: aPTT: 36 seconds (ref 24–37)

## 2012-03-17 LAB — PROTIME-INR: INR: 2.65 — ABNORMAL HIGH (ref 0.00–1.49)

## 2012-03-17 MED ORDER — ONDANSETRON HCL 4 MG/2ML IJ SOLN
4.0000 mg | Freq: Once | INTRAMUSCULAR | Status: AC
  Administered 2012-03-17: 4 mg via INTRAVENOUS
  Filled 2012-03-17: qty 2

## 2012-03-17 NOTE — ED Notes (Signed)
gingerale and crackers given

## 2012-03-17 NOTE — ED Notes (Signed)
Pt presents to ED for evaluation of hypertension per at home machine.  Pts took her BP this morning and found it to be 160/98, began feeling tightness in her chest took 1 nitro and received 1 of nitro by EMS.  Pt pain free at present states she is nauseas, pt given Zofran for nausea.   No complaints at this time, pt alert and oriented, no acute distress noted at this time.

## 2012-03-17 NOTE — ED Notes (Signed)
Pt discharged.Vital signs stable and GCS 15 

## 2012-03-17 NOTE — ED Notes (Signed)
Nitro and ASA given PTA with pull relief of Chest Pressure

## 2012-03-17 NOTE — ED Provider Notes (Signed)
History     CSN: [id number]  Arrival date & time 03/17/12  1347   First MD Initiated Contact with Patient 03/17/12 1441      Chief Complaint  Patient presents with  . Dizziness    (Consider location/radiation/quality/duration/timing/severity/associated sxs/prior treatment) Patient is a 61 y.o. female presenting with chest pain. The history is provided by the patient.  Chest Pain Episode onset: began about 11am. Episode Length: Pt has a home health care worker who came to the house during the episode and called EMS. EMS gave Nitro and Zofran and pt began to feel better after about an hour. Episode frequency: Pt has AICD that has been going off lately due to spikes in blood pressure. She has been experiencing these symptoms frequently over the last few weeks. The chest pain is improving. Associated with: spikes in blood pressure. Primary symptoms include palpitations and dizziness. Pertinent negatives for primary symptoms include no fever, no shortness of breath and no cough.  The palpitations also occurred with dizziness. The palpitations did not occur with syncope or shortness of breath.  Dizziness also occurs with weakness and diaphoresis.  Associated symptoms include diaphoresis and weakness.  Pertinent negatives for associated symptoms include no numbness.     Past Medical History  Diagnosis Date  . Spinal stenosis   . Numbness of foot     left foot  . Diverticulitis     Status post partial colectomy 1/12 with reversal in July 2012  . Colitis, ischemic   . DJD (degenerative joint disease)     History of multiple surgeries to the back, shoulder and knee  . Ischemic cardiomyopathy     Echo 06/16/10: EF 25-30%, anteroseptal and apical hypokinesis, moderate AI, mild MR  . Chronic systolic heart failure   . CAD (coronary artery disease)     a.  Ant MI 8/03 with stenting of the LAD;  b. staged PCI with Cypher DES to OM1 8/03;  c. s/p Cypher DES to LAD 7/04;  d.  multiple cardiac  caths in past (chronically abnl ECG);    e. cardiac catheterization 3/12: LAD stent patent, distal LAD 40-50%, small ostial D1 80%, ostial D2 60%, OM-1 stent patent (20-30%), proximal RCA 50%, proximal to mid RCA 40-50%; f. cath 02/17/2011 - nonobs  . LV (left ventricular) mural thrombus     Chronic Coumadin therapy  . CKD (chronic kidney disease), stage III     creatinine:  1.3 in 4/12;    . Tobacco abuse     history  . GERD (gastroesophageal reflux disease)   . Hypertension   . Lower GI bleed     August 2011  . Hypothyroidism   . Aortic insufficiency   . Pseudoaneurysm     History of, right forearm  . Hyperlipidemia   . OA (osteoarthritis)   . Abnormal EKG     Chronically abnormal EKG.   . Spinal stenosis   . Numbness of foot     Left foot  . Compression fracture 07/04/11    L2  . Anterior myocardial infarction 10/2001  . SVT (supraventricular tachycardia)     ? h/o AFib;  patient on long term amiodarone therapy  . PAF (paroxysmal atrial fibrillation)   . Anatomical narrow angle of right eye   . Heart murmur     "age 33-19"  . Shortness of breath     "at times; related to CHF"  . Shortness of breath on exertion   . ICD (implantable cardiac defibrillator)  in place 07/2003    followed by Dr. Graciela Husbands.   [name] AICD (automatic cardioverter/defibrillator) present 10/2004    explant; implant  . Blood transfusion   . Anemia   . H/O hiatal hernia   . Stroke     History of TIA and possible CVA; hospitalized 2004; on coumadin  . Systolic heart failure   . Sinus bradycardia 03/05/2012    Past Surgical History  Procedure Date  . Back surgery   . Coronary angioplasty with stent placement 10/2001; 09/2002  . Cardiac defibrillator placement 07/2003; 10/2004  . Lumbar laminectomy/decompression microdiscectomy 10/2001    L5-S1/E-chart  . Knee arthroscopy     left  . Thyroidectomy 1970's  . Total knee arthroplasty 11/2003    left  . X-stop implantation 12/2004    L3-4; L4-5  . Fixation  kyphoplasty thoracic spine 07/2007    T3, 4, 6 compression fractures  . Shoulder open rotator cuff repair 04/2008    right  . Lumbar laminectomy/decompression microdiscectomy 01/2010  . Colectomy 03/2010    sigmoid left  . Colostomy 03/2010    transverse  . Peripherally inserted central catheter insertion 03/2010    removed upon discharge  . Colostomy closure 12/2010    reversal  . Tonsillectomy and adenoidectomy 1969  . Appendectomy 03/2010  . Cataract extraction     right    Family History  Problem Relation Age of Onset  . Diabetes Mother   . Diabetes Brother   . Arthritis      family history  . Prostate cancer      Family History    History  Substance Use Topics  . Smoking status: Former Smoker -- 0.1 packs/day for 33 years    Types: Cigarettes    Quit date: 04/21/2001  . Smokeless tobacco: Never Used     Comment: "smoked ~ 1 pack/month"  . Alcohol Use: No     Comment: 07/05/11 "occasionally; last time was glass of wine last week"    OB History    Grav Para Term Preterm Abortions TAB SAB Ect Mult Living                  Review of Systems  Constitutional: Positive for chills and diaphoresis. Negative for fever.  Respiratory: Positive for chest tightness. Negative for apnea, cough and shortness of breath.   Cardiovascular: Positive for chest pain and palpitations. Negative for leg swelling.       Chest tightness  Gastrointestinal: Negative for abdominal distention.  Genitourinary: Negative for flank pain.  Musculoskeletal: Positive for back pain.       Pt had right sided lower lumber compression fx 5 months ago, has been in brace for 3 months, and has chronic pain since.   Neurological: Positive for dizziness, weakness and light-headedness. Negative for syncope, numbness and headaches.    Allergies  Lansoprazole; Penicillins; Statins; and Lactose intolerance (gi)  Home Medications   Current Outpatient Rx  Name  Route  Sig  Dispense  Refill  . AMIODARONE HCL  200 MG PO TABS   Oral   Take 200 mg by mouth daily.         . ASPIRIN 81 MG PO TABS   Oral   Take 324 mg by mouth once.         . ASPIRIN EC 81 MG PO TBEC   Oral   Take 81 mg by mouth daily.         [name] CALCIUM CARBONATE-VITAMIN D 500-200 MG-UNIT  PO TABS   Oral   Take 1 tablet by mouth 2 (two) times daily with a meal.         . CARVEDILOL 12.5 MG PO TABS   Oral   Take 1 tablet (12.5 mg total) by mouth 2 (two) times daily with a meal.   60 tablet   6   . CYCLOBENZAPRINE HCL 5 MG PO TABS   Oral   Take 5 mg by mouth 2 (two) times daily as needed. For muscle spasms.         [name] EZETIMIBE 10 MG PO TABS   Oral   Take 10 mg by mouth at bedtime.          [name] FEXOFENADINE HCL 180 MG PO TABS   Oral   Take 180 mg by mouth daily.          . OMEGA-3 FATTY ACIDS 1000 MG PO CAPS   Oral   Take 1,000 mg by mouth daily.           [name] FLUTICASONE PROPIONATE 50 MCG/ACT NA SUSP   Nasal   Place 2 sprays into the nose daily as needed. For allergies         . FA-PYRIDOXINE-CYANCOBALAMIN 2.5-25-2 MG PO TABS   Oral   Take 1 tablet by mouth daily.           . FUROSEMIDE 20 MG PO TABS   Oral   Take 20 mg by mouth daily.         [name] LEVOTHYROXINE SODIUM 200 MCG PO TABS   Oral   Take 1 tablet (200 mcg total) by mouth daily.   30 tablet   6   . LISINOPRIL 5 MG PO TABS   Oral   Take 2.5 mg by mouth 2 (two) times daily.         [name] METHOCARBAMOL 500 MG PO TABS   Oral   Take 500 mg by mouth 4 (four) times daily as needed. For spasms         . NITROGLYCERIN 0.4 MG SL SUBL   Sublingual   Place 0.4 mg under the tongue every 5 (five) minutes as needed. For chest pain         . PANTOPRAZOLE SODIUM 40 MG PO TBEC   Oral   Take 40 mg by mouth 2 (two) times daily.          [name] POLYETHYLENE GLYCOL 3350 PO PACK   Oral   Take 17 g by mouth daily as needed. For constipation         . POTASSIUM CHLORIDE CRYS ER 20 MEQ PO TBCR   Oral   Take 20 mEq by mouth daily.          [name] PREGABALIN 50 MG PO CAPS   Oral   Take 1 capsule (50 mg total) by mouth 3 (three) times daily.   90 capsule   5   . RALOXIFENE HCL 60 MG PO TABS   Oral   Take 60 mg by mouth daily.         . WARFARIN SODIUM 3 MG PO TABS   Oral   Take 3-4.5 mg by mouth daily. Take 3 mg daily except on Wednesday and Friday and takes 4.5 mg           BP 125/59  Pulse 70  Temp 98.8 F (37.1 C) (Oral)  Resp 17  SpO2 99%  Physical Exam  Constitutional: She is oriented to  person, place, and time. She appears well-developed and well-nourished.  Neck: Normal range of motion. Neck supple.  Cardiovascular: Normal rate, regular rhythm and normal heart sounds.        AICD implanted on left side.  Pulmonary/Chest: Effort normal and breath sounds normal. No respiratory distress. She has no wheezes. She has no rales. She exhibits no tenderness.  Abdominal: Soft. Bowel sounds are normal. There is no tenderness.  Musculoskeletal: Normal range of motion.       Right sided weakness 4/5 in upper and lower extremities possibly secondary to pain due to previous lower lumbar compression fx  Neurological: She is alert and oriented to person, place, and time.  Skin: Skin is warm and dry.    ED Course  Procedures (including critical care time)   Labs Reviewed  PRO B NATRIURETIC PEPTIDE  CBC WITH DIFFERENTIAL  COMPREHENSIVE METABOLIC PANEL  PROTIME-INR  APTT   No results found.   No diagnosis found.    MDM  Pt has significant cardiac hx including stents, previous MI, CHF, and AICD. EKG as compared to previous EKG's showed no acute changes. Will hold for observation and 2 sets of troponins. Plan to be determined pending results. Pt moved to CDU, transferred to the care of Felicie Morn, NP.  Glade Nurse, PA-C 03/17/12 1611

## 2012-03-17 NOTE — ED Notes (Signed)
To ED via [facility] for eval of CP and dizziness. Hx of MI with stents. Defib

## 2012-03-17 NOTE — ED Provider Notes (Signed)
Labs reviewed, discussed with patient.  Cardiac markers negative.  Patient states she is feeling better, presenting symptoms have resolved.  Patient has home health nurse, physical therapy visiting at home several times each week.  Patient discharged home with follow-up by PCP.  Jimmye Norman, NP 03/17/12 Ernestina [place]

## 2012-03-17 NOTE — ED Provider Notes (Signed)
Medical screening examination/treatment/procedure(s) were conducted as a shared visit with non-physician practitioner(s) and myself.  I personally evaluated the patient during the encounter.  This pleasant 61 year old female has had daily vague chest discomforts very mild nonradiating and without associated symptoms lasting for hours at a time every day for the last few weeks and was recently admitted and ruled out for acute coronary syndrome for similar symptoms. She her same symptoms for several hours today as well as some transient brief lightheadedness without vertigo earlier today. She also had no change in speech vision swallowing or understanding and no weakness numbness or incoordination. Clinically I doubt acute coronary syndrome. She is not short of breath today despite her elevated BNP and she has a history of heart failure and has had elevated BNP in the past as well.  ECG: Sinus rhythm, ventricular rate 69, left axis deviation, inferior and lateral Q waves, slight ST elevation inferior leads as well as V3 and V4, no significant change noted compared with 03/04/2012  Hurman Horn, MD 03/17/12 2109

## 2012-03-20 ENCOUNTER — Encounter (HOSPITAL_COMMUNITY): Payer: Self-pay | Admitting: *Deleted

## 2012-03-20 ENCOUNTER — Emergency Department (HOSPITAL_COMMUNITY)
Admission: EM | Admit: 2012-03-20 | Discharge: 2012-03-20 | Disposition: A | Discharge status: Home / Self Care | Payer: Medicare Other | Attending: Emergency Medicine | Admitting: Emergency Medicine

## 2012-03-20 DIAGNOSIS — K219 Gastro-esophageal reflux disease without esophagitis: Secondary | ICD-10-CM | POA: Insufficient documentation

## 2012-03-20 DIAGNOSIS — Z8673 Personal history of transient ischemic attack (TIA), and cerebral infarction without residual deficits: Secondary | ICD-10-CM | POA: Insufficient documentation

## 2012-03-20 DIAGNOSIS — R5383 Other fatigue: Secondary | ICD-10-CM | POA: Insufficient documentation

## 2012-03-20 DIAGNOSIS — N183 Chronic kidney disease, stage 3 unspecified: Secondary | ICD-10-CM | POA: Insufficient documentation

## 2012-03-20 DIAGNOSIS — Z95810 Presence of automatic (implantable) cardiac defibrillator: Secondary | ICD-10-CM | POA: Insufficient documentation

## 2012-03-20 DIAGNOSIS — Z7901 Long term (current) use of anticoagulants: Secondary | ICD-10-CM | POA: Insufficient documentation

## 2012-03-20 DIAGNOSIS — Z9861 Coronary angioplasty status: Secondary | ICD-10-CM | POA: Insufficient documentation

## 2012-03-20 DIAGNOSIS — I252 Old myocardial infarction: Secondary | ICD-10-CM | POA: Insufficient documentation

## 2012-03-20 DIAGNOSIS — Z8679 Personal history of other diseases of the circulatory system: Secondary | ICD-10-CM | POA: Insufficient documentation

## 2012-03-20 DIAGNOSIS — E785 Hyperlipidemia, unspecified: Secondary | ICD-10-CM | POA: Insufficient documentation

## 2012-03-20 DIAGNOSIS — Z87891 Personal history of nicotine dependence: Secondary | ICD-10-CM | POA: Insufficient documentation

## 2012-03-20 DIAGNOSIS — R5381 Other malaise: Secondary | ICD-10-CM | POA: Insufficient documentation

## 2012-03-20 DIAGNOSIS — Z87311 Personal history of (healed) other pathological fracture: Secondary | ICD-10-CM | POA: Insufficient documentation

## 2012-03-20 DIAGNOSIS — Z86718 Personal history of other venous thrombosis and embolism: Secondary | ICD-10-CM | POA: Insufficient documentation

## 2012-03-20 DIAGNOSIS — Z79899 Other long term (current) drug therapy: Secondary | ICD-10-CM | POA: Insufficient documentation

## 2012-03-20 DIAGNOSIS — I5022 Chronic systolic (congestive) heart failure: Secondary | ICD-10-CM | POA: Insufficient documentation

## 2012-03-20 DIAGNOSIS — E039 Hypothyroidism, unspecified: Secondary | ICD-10-CM | POA: Insufficient documentation

## 2012-03-20 DIAGNOSIS — R011 Cardiac murmur, unspecified: Secondary | ICD-10-CM | POA: Insufficient documentation

## 2012-03-20 DIAGNOSIS — I2510 Atherosclerotic heart disease of native coronary artery without angina pectoris: Secondary | ICD-10-CM | POA: Insufficient documentation

## 2012-03-20 DIAGNOSIS — Z8669 Personal history of other diseases of the nervous system and sense organs: Secondary | ICD-10-CM | POA: Insufficient documentation

## 2012-03-20 DIAGNOSIS — Z7982 Long term (current) use of aspirin: Secondary | ICD-10-CM | POA: Insufficient documentation

## 2012-03-20 DIAGNOSIS — R531 Weakness: Secondary | ICD-10-CM

## 2012-03-20 DIAGNOSIS — I129 Hypertensive chronic kidney disease with stage 1 through stage 4 chronic kidney disease, or unspecified chronic kidney disease: Secondary | ICD-10-CM | POA: Insufficient documentation

## 2012-03-20 DIAGNOSIS — Z862 Personal history of diseases of the blood and blood-forming organs and certain disorders involving the immune mechanism: Secondary | ICD-10-CM | POA: Insufficient documentation

## 2012-03-20 DIAGNOSIS — Z8739 Personal history of other diseases of the musculoskeletal system and connective tissue: Secondary | ICD-10-CM | POA: Insufficient documentation

## 2012-03-20 DIAGNOSIS — Z8719 Personal history of other diseases of the digestive system: Secondary | ICD-10-CM | POA: Insufficient documentation

## 2012-03-20 LAB — POCT I-STAT, CHEM 8
BUN: 32 mg/dL — ABNORMAL HIGH (ref 6–23)
Calcium, Ion: 1.07 mmol/L — ABNORMAL LOW (ref 1.13–1.30)
Chloride: 101 meq/L (ref 96–112)
Creatinine, Ser: 1.6 mg/dL — ABNORMAL HIGH (ref 0.50–1.10)
Glucose, Bld: 101 mg/dL — ABNORMAL HIGH (ref 70–99)
HCT: 49 % — ABNORMAL HIGH (ref 36.0–46.0)
Hemoglobin: 16.7 g/dL — ABNORMAL HIGH (ref 12.0–15.0)
Potassium: 4.3 meq/L (ref 3.5–5.1)
Sodium: 137 meq/L (ref 135–145)
TCO2: 28 mmol/L (ref 0–100)

## 2012-03-20 LAB — POCT I-STAT TROPONIN I: Troponin i, poc: 0 ng/mL (ref 0.00–0.08)

## 2012-03-20 NOTE — ED Notes (Signed)
The pt arrived by [facility] from home with weakness.  She has been wearing a monitor for  A fast heartbeat and when it  Increased to 102 she called ems to transport her here.  She has a new onset of af.  No chest pain

## 2012-03-20 NOTE — ED Notes (Signed)
Discharge inst reviewed with patient  Patient voiced understanding.

## 2012-03-20 NOTE — ED Notes (Signed)
No chest pain no sob she is pleasant and laughing

## 2012-03-20 NOTE — ED Notes (Signed)
The pt has no complaints.  Waiting for a dosposition.  No pain

## 2012-03-20 NOTE — ED Notes (Signed)
Patient discharged via [facility].  

## 2012-03-20 NOTE — ED Provider Notes (Signed)
History     CSN: [id number]  Arrival date & time 03/20/12  1816   First MD Initiated Contact with Patient 03/20/12 1822      Chief Complaint  Patient presents with  . Weakness    (Consider location/radiation/quality/duration/timing/severity/associated sxs/prior treatment) HPI This 61 year old female has a history of heart disease and feels just generally weak all day today. She is not lightheaded she is no vertigo no headache no change in speech vision swallowing or understanding she is no lateralizing or focal weakness numbness or incoordination, she is no chest pain chest pressure palpitations shortness of breath or cough or fever abdominal pain vomiting or other concerns. She was told to come to the ED if her pulse rate was over 101 as high as 102 today just prior to arrival so she came to the ED for evaluation as directed. She did have a home blood pressure monitor and this morning and 1 blood pressure readings with systolic pressure in the 50s however all are other systolic pressures above 100 so that when breathing may have been spurious and is of uncertain significance. She states her heart rate has been above 60 and less than 100 except for the one time just prior to arrival when it was 102. Her highest blood pressure reading today with systolic approximately 160. There is no treatment prior to arrival. She is no edema today no orthopnea today. Past Medical History  Diagnosis Date  . Spinal stenosis   . Numbness of foot     left foot  . Diverticulitis     Status post partial colectomy 1/12 with reversal in July 2012  . Colitis, ischemic   . DJD (degenerative joint disease)     History of multiple surgeries to the back, shoulder and knee  . Ischemic cardiomyopathy     Echo 06/16/10: EF 25-30%, anteroseptal and apical hypokinesis, moderate AI, mild MR  . Chronic systolic heart failure   . CAD (coronary artery disease)     a.  Ant MI 8/03 with stenting of the LAD;  b. staged PCI  with Cypher DES to OM1 8/03;  c. s/p Cypher DES to LAD 7/04;  d.  multiple cardiac caths in past (chronically abnl ECG);    e. cardiac catheterization 3/12: LAD stent patent, distal LAD 40-50%, small ostial D1 80%, ostial D2 60%, OM-1 stent patent (20-30%), proximal RCA 50%, proximal to mid RCA 40-50%; f. cath 02/17/2011 - nonobs  . LV (left ventricular) mural thrombus     Chronic Coumadin therapy  . CKD (chronic kidney disease), stage III     creatinine:  1.3 in 4/12;    . Tobacco abuse     history  . GERD (gastroesophageal reflux disease)   . Hypertension   . Lower GI bleed     August 2011  . Hypothyroidism   . Aortic insufficiency   . Pseudoaneurysm     History of, right forearm  . Hyperlipidemia   . OA (osteoarthritis)   . Abnormal EKG     Chronically abnormal EKG.   . Spinal stenosis   . Numbness of foot     Left foot  . Compression fracture 07/04/11    L2  . Anterior myocardial infarction 10/2001  . SVT (supraventricular tachycardia)     ? h/o AFib;  patient on long term amiodarone therapy  . PAF (paroxysmal atrial fibrillation)   . Anatomical narrow angle of right eye   . Heart murmur     "age 58-19"  .  Shortness of breath     "at times; related to CHF"  . Shortness of breath on exertion   . 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 Graciela Husbands.   [name] AICD (automatic cardioverter/defibrillator) present 10/2004    explant; implant  . Blood transfusion   . Anemia   . H/O hiatal hernia   . Stroke     History of TIA and possible CVA; hospitalized 2004; on coumadin  . Systolic heart failure   . Sinus bradycardia 03/05/2012    Past Surgical History  Procedure Date  . Back surgery   . Coronary angioplasty with stent placement 10/2001; 09/2002  . Cardiac defibrillator placement 07/2003; 10/2004  . Lumbar laminectomy/decompression microdiscectomy 10/2001    L5-S1/E-chart  . Knee arthroscopy     left  . Thyroidectomy 1970's  . Total knee  arthroplasty 11/2003    left  . X-stop implantation 12/2004    L3-4; L4-5  . Fixation kyphoplasty thoracic spine 07/2007    T3, 4, 6 compression fractures  . Shoulder open rotator cuff repair 04/2008    right  . Lumbar laminectomy/decompression microdiscectomy 01/2010  . Colectomy 03/2010    sigmoid left  . Colostomy 03/2010    transverse  . Peripherally inserted central catheter insertion 03/2010    removed upon discharge  . Colostomy closure 12/2010    reversal  . Tonsillectomy and adenoidectomy 1969  . Appendectomy 03/2010  . Cataract extraction     right    Family History  Problem Relation Age of Onset  . Diabetes Mother   . Diabetes Brother   . Arthritis      family history  . Prostate cancer      Family History    History  Substance Use Topics  . Smoking status: Former Smoker -- 0.1 packs/day for 33 years    Types: Cigarettes    Quit date: 04/21/2001  . Smokeless tobacco: Never Used     Comment: "smoked ~ 1 pack/month"  . Alcohol Use: No     Comment: 07/05/11 "occasionally; last time was glass of wine last week"    OB History    Grav Para Term Preterm Abortions TAB SAB Ect Mult Living                  Review of Systems 10 Systems reviewed and are negative for acute change except as noted in the HPI. Allergies  Lansoprazole; Penicillins; Statins; and Lactose intolerance (gi)  Home Medications   Current Outpatient Rx  Name  Route  Sig  Dispense  Refill  . AMIODARONE HCL 200 MG PO TABS   Oral   Take 200 mg by mouth daily.         . ASPIRIN EC 81 MG PO TBEC   Oral   Take 81 mg by mouth daily.         [name] CALCIUM CARBONATE-VITAMIN D 500-200 MG-UNIT PO TABS   Oral   Take 1 tablet by mouth 2 (two) times daily with a meal.         . CARVEDILOL 12.5 MG PO TABS   Oral   Take 1 tablet (12.5 mg total) by mouth 2 (two) times daily with a meal.   60 tablet   6   . CYCLOBENZAPRINE HCL 5 MG PO TABS   Oral   Take 5 mg by mouth 2 (two) times daily as  needed. For muscle spasms.         [name]  EZETIMIBE 10 MG PO TABS   Oral   Take 10 mg by mouth at bedtime.          [name] FEXOFENADINE HCL 180 MG PO TABS   Oral   Take 180 mg by mouth daily.          . OMEGA-3 FATTY ACIDS 1000 MG PO CAPS   Oral   Take 1,000 mg by mouth daily.           [name] FLUTICASONE PROPIONATE 50 MCG/ACT NA SUSP   Nasal   Place 2 sprays into the nose daily as needed. For allergies         . FA-PYRIDOXINE-CYANCOBALAMIN 2.5-25-2 MG PO TABS   Oral   Take 1 tablet by mouth daily.           . FUROSEMIDE 20 MG PO TABS   Oral   Take 20 mg by mouth daily.         [name] HYDROCODONE-ACETAMINOPHEN 7.5-325 MG PO TABS   Oral   Take 1 tablet by mouth every 8 (eight) hours as needed. For pain         . LEVOTHYROXINE SODIUM 200 MCG PO TABS   Oral   Take 1 tablet (200 mcg total) by mouth daily.   30 tablet   6   . LISINOPRIL 5 MG PO TABS   Oral   Take 2.5 mg by mouth 2 (two) times daily.         [name] METHOCARBAMOL 500 MG PO TABS   Oral   Take 500 mg by mouth 4 (four) times daily as needed. For spasms         . NITROGLYCERIN 0.4 MG SL SUBL   Sublingual   Place 0.4 mg under the tongue every 5 (five) minutes as needed. For chest pain         . PANTOPRAZOLE SODIUM 40 MG PO TBEC   Oral   Take 40 mg by mouth 2 (two) times daily.          [name] POLYETHYLENE GLYCOL 3350 PO PACK   Oral   Take 17 g by mouth daily as needed. For constipation         . POTASSIUM CHLORIDE CRYS ER 20 MEQ PO TBCR   Oral   Take 20 mEq by mouth daily.         [name] PREGABALIN 50 MG PO CAPS   Oral   Take 1 capsule (50 mg total) by mouth 3 (three) times daily.   90 capsule   5   . PROMETHAZINE HCL 25 MG PO TABS   Oral   Take 25 mg by mouth every 6 (six) hours as needed. For nausea         . RALOXIFENE HCL 60 MG PO TABS   Oral   Take 60 mg by mouth daily.         . WARFARIN SODIUM 3 MG PO TABS   Oral   Take 3-4.5 mg by mouth daily. Take 3 mg daily except on Wednesday  and Friday and takes 4.5 mg           BP 105/54  Pulse 76  Temp 98.8 F (37.1 C) (Oral)  Resp 15  SpO2 100%  Physical Exam  Nursing note and vitals reviewed. Constitutional: She is oriented to person, place, and time.       Awake, alert, nontoxic appearance.  HENT:  Head: Atraumatic.  Eyes: Right eye exhibits no discharge. Left  eye exhibits no discharge.  Neck: Neck supple.  Cardiovascular: Normal rate and regular rhythm.   No murmur heard. Pulmonary/Chest: Effort normal and breath sounds normal. No respiratory distress. She has no wheezes. She has no rales. She exhibits no tenderness.  Abdominal: Soft. There is no tenderness. There is no rebound.  Musculoskeletal: She exhibits no edema and no tenderness.       Baseline ROM, no obvious new focal weakness.  Neurological: She is alert and oriented to person, place, and time.       Mental status and motor strength appears baseline for patient and situation.  Skin: No rash noted.  Psychiatric: She has a normal mood and affect.    ED Course  Procedures (including critical care time) ECG: Sinus rhythm, ventricular rate 77, left axis deviation, left ventricular hypertrophy, nonspecific intraventricular conduction delay, inferior Q waves, inferior ST elevation, ST elevation V3, V4, and slightly in V5 as well, no significant change compared with 03/17/2012 Labs Reviewed  POCT I-STAT, CHEM 8 - Abnormal; Notable for the following:    BUN 32 (*)     Creatinine, Ser 1.60 (*)     Glucose, Bld 101 (*)     Calcium, Ion 1.07 (*)     Hemoglobin 16.7 (*)     HCT 49.0 (*)     All other components within normal limits  POCT I-STAT TROPONIN I  LAB REPORT - SCANNED   No results found.   1. Weakness       MDM  Patient / Family / Caregiver informed of clinical course, understand medical decision-making process, and agree with plan.I doubt any other EMC precluding discharge at this time including, but not necessarily limited to the  following:ACS.        Hurman Horn, MD 03/22/12 2125

## 2012-05-04 ENCOUNTER — Telehealth: Payer: Self-pay | Admitting: Physician Assistant

## 2012-05-04 NOTE — Telephone Encounter (Signed)
[name] called in to the answering service. Office closed due to snow. Her [employer] ICD beeped twice this morning, once at 7am and once just now. No CP, SOB, nausea, diaphoresis, palpitations, ICD shock or any other symptoms. D/w EP team. Device is likely at [facility]. Left message on scheduling voicemail to arrange f/u appointment for this upcoming week in our office. Pt made aware. She also stated she knows to go to the ER if she develops any symptoms concerning to her in the meantime. [place] PA-C

## 2012-05-07 ENCOUNTER — Other Ambulatory Visit: Payer: Self-pay

## 2012-05-07 ENCOUNTER — Ambulatory Visit (INDEPENDENT_AMBULATORY_CARE_PROVIDER_SITE_OTHER): Payer: Medicare Other | Admitting: *Deleted

## 2012-05-07 DIAGNOSIS — I2589 Other forms of chronic ischemic heart disease: Secondary | ICD-10-CM

## 2012-05-07 DIAGNOSIS — I255 Ischemic cardiomyopathy: Secondary | ICD-10-CM

## 2012-05-07 DIAGNOSIS — Z95810 Presence of automatic (implantable) cardiac defibrillator: Secondary | ICD-10-CM

## 2012-05-07 LAB — ICD DEVICE OBSERVATION
BATTERY VOLTAGE: 2.59 V
CHARGE TIME: 19.5 s
HV IMPEDENCE: 41 Ohm
TZAT-0004FASTVT: 8
TZAT-0005FASTVT: 81 pct
TZAT-0012FASTVT: 200 ms
TZAT-0012FASTVT: 200 ms
TZAT-0013FASTVT: 2
TZAT-0013FASTVT: 2
TZAT-0018FASTVT: NEGATIVE
TZAT-0019FASTVT: 7.5 V
TZAT-0020FASTVT: 1 ms
TZON-0003FASTVT: 333 ms
TZON-0004FASTVT: 2.5
TZST-0001FASTVT: 3
TZST-0001FASTVT: 6
TZST-0003FASTVT: 31 J
TZST-0003FASTVT: 31 J
TZST-0003FASTVT: 31 J

## 2012-05-07 NOTE — Progress Notes (Signed)
defib check in clinic  

## 2012-05-21 ENCOUNTER — Encounter: Payer: Self-pay | Admitting: Internal Medicine

## 2012-05-23 ENCOUNTER — Encounter: Payer: Self-pay | Admitting: Internal Medicine

## 2012-05-23 ENCOUNTER — Ambulatory Visit (INDEPENDENT_AMBULATORY_CARE_PROVIDER_SITE_OTHER): Payer: Medicare Other | Admitting: Internal Medicine

## 2012-05-23 ENCOUNTER — Telehealth: Payer: Self-pay | Admitting: *Deleted

## 2012-05-23 VITALS — BP 104/72 | HR 86 | Ht 66.0 in | Wt 146.0 lb

## 2012-05-23 DIAGNOSIS — R001 Bradycardia, unspecified: Secondary | ICD-10-CM

## 2012-05-23 DIAGNOSIS — Z95810 Presence of automatic (implantable) cardiac defibrillator: Secondary | ICD-10-CM

## 2012-05-23 DIAGNOSIS — I4891 Unspecified atrial fibrillation: Secondary | ICD-10-CM

## 2012-05-23 DIAGNOSIS — N179 Acute kidney failure, unspecified: Secondary | ICD-10-CM

## 2012-05-23 DIAGNOSIS — I2589 Other forms of chronic ischemic heart disease: Secondary | ICD-10-CM

## 2012-05-23 DIAGNOSIS — I255 Ischemic cardiomyopathy: Secondary | ICD-10-CM

## 2012-05-23 DIAGNOSIS — I498 Other specified cardiac arrhythmias: Secondary | ICD-10-CM

## 2012-05-23 DIAGNOSIS — I480 Paroxysmal atrial fibrillation: Secondary | ICD-10-CM

## 2012-05-23 DIAGNOSIS — I5022 Chronic systolic (congestive) heart failure: Secondary | ICD-10-CM

## 2012-05-23 LAB — COMPREHENSIVE METABOLIC PANEL
Albumin: 3.6 g/dL (ref 3.5–5.2)
BUN: 16 mg/dL (ref 6–23)
CO2: 22 mEq/L (ref 19–32)
Calcium: 8.8 mg/dL (ref 8.4–10.5)
Chloride: 107 mEq/L (ref 96–112)
GFR: 65.99 mL/min (ref >60.00)
Glucose, Bld: 101 mg/dL — ABNORMAL HIGH (ref 70–99)
Potassium: 4.4 mEq/L (ref 3.5–5.1)
Sodium: 139 mEq/L (ref 135–145)
Total Protein: 6.8 g/dL (ref 6.0–8.3)

## 2012-05-23 MED ORDER — CARVEDILOL 3.125 MG PO TABS: 3.1250 mg | ORAL_TABLET | Freq: Two times a day (BID) | ORAL | Status: DC

## 2012-05-23 NOTE — Assessment & Plan Note (Signed)
We will anticipate device upgrade fort symptomatic and progressive sinus bradycardia\

## 2012-05-23 NOTE — Assessment & Plan Note (Signed)
We have reviewed the benefits and risks of generator replacement.  These include but are not limited to lead fracture and infection.  The patient understands, agrees and is willing to proceed.   As above we will try to determine the need for anti-bradycardia pacing and device upgrade to dual-chamber

## 2012-05-23 NOTE — Assessment & Plan Note (Signed)
Stable on current medications apart from was noted above. Her QRS is narrow. There is no device indication for CRT

## 2012-05-23 NOTE — Assessment & Plan Note (Signed)
No intercurrent atrial fibrillation she remains on amiodarone. Surveillance laboratories in December had demonstrated elevated liver function tests. We will repeat them today. T4 was normal

## 2012-05-23 NOTE — Telephone Encounter (Signed)
Please see request below

## 2012-05-23 NOTE — Assessment & Plan Note (Signed)
Last creatinine was measured in December. At that time was 1.6 up from 1.0. We'll recheck it today.

## 2012-05-23 NOTE — Telephone Encounter (Signed)
Spoke with [place] at [place]. She will refax the "face to face" form that needs completing.

## 2012-05-23 NOTE — Patient Instructions (Addendum)
Decrease Coreg to 3.125mg  twice daily.  New Rx sent in to pharmacy.  LABS TODAY:  CMET  Your physician has recommended that you wear a 24 hour holter monitor in 2 weeks. Holter monitors are medical devices that record the heart's electrical activity. Doctors most often use these monitors to diagnose arrhythmias. Arrhythmias are problems with the speed or rhythm of the heartbeat. The monitor is a small, portable device. You can wear one while you do your normal daily activities. This is usually used to diagnose what is causing palpitations/syncope (passing out).  Hardin Negus, MA will call you with all the details of your procedure within the next week.  If you have questions, call at [phone].

## 2012-05-23 NOTE — Progress Notes (Signed)
Patient Care Team: Etta Grandchild, MD as PCP - General Rollene Rotunda, MD (Cardiology)   HPI  [name] is a 62 y.o. female who is a prior patient of Dr. Deborah Chalk. She is status post ICD implantation for primary prevention in the setting of ischemic heart disease under treatment by me in 2005. In 2006 she underwent generator replacement because of a defective device.  Her device has reached ERI  She has had intermittent rapid atrial raise-120 or so   She was hospitalized 12/13 for inappropriate ICD discharge related to relatively slow one-to-one atrial flutter in the context of amiodarone therapy pursued for atrial fibrillation that have been associated with inappropriate ICD discharges 2012. The device was reprogrammed in the fall 2013   it occurred in the setting of a colostomy takedown and hypokalemia. Her device was reprogrammed. And potassium repleted and she was discharged     Her past medical history includes CAD, status post anterior myocardial infarction treated with a stent in the LAD and staged Cypher drug eluting stenting to the OM-1 in 8/03. She then had a Cypher drug-eluting stent placed to the LAD in 7/04. She's had multiple cardiac catheterizations in the past. Her last heart catheterization was 06/17/10 which demonstrated a patent stent in the LAD and a patent stent in the OM1 with nonobstructive disease elsewhere. She did have 80% stenosis in a small ostial Dx-1. She also has an ischemic cardiomyopathy.  Her last echocardiogram 06/08/10: EF 25-30%, anteroseptal and apical akinesis, moderate AI and mild MR. Left atrial size was normal      Past Medical History  Diagnosis Date  . Spinal stenosis   . Numbness of foot     left foot  . Diverticulitis     Status post partial colectomy 1/12 with reversal in July 2012  . Colitis, ischemic   . DJD (degenerative joint disease)     History of multiple surgeries to the back, shoulder and knee  . Ischemic cardiomyopathy    Echo 06/16/10: EF 25-30%, anteroseptal and apical hypokinesis, moderate AI, mild MR  . Chronic systolic heart failure   . CAD (coronary artery disease)     a.  Ant MI 8/03 with stenting of the LAD;  b. staged PCI with Cypher DES to OM1 8/03;  c. s/p Cypher DES to LAD 7/04;  d.  multiple cardiac caths in past (chronically abnl ECG);    e. cardiac catheterization 3/12: LAD stent patent, distal LAD 40-50%, small ostial D1 80%, ostial D2 60%, OM-1 stent patent (20-30%), proximal RCA 50%, proximal to mid RCA 40-50%; f. cath 02/17/2011 - nonobs  . LV (left ventricular) mural thrombus     Chronic Coumadin therapy  . CKD (chronic kidney disease), stage III     creatinine:  1.3 in 4/12;    . Tobacco abuse     history  . GERD (gastroesophageal reflux disease)   . Hypertension   . Lower GI bleed     August 2011  . Hypothyroidism   . Aortic insufficiency   . Pseudoaneurysm     History of, right forearm  . Hyperlipidemia   . OA (osteoarthritis)   . Abnormal EKG     Chronically abnormal EKG.   . Spinal stenosis   . Numbness of foot     Left foot  . Compression fracture 07/04/11    L2  . Anterior myocardial infarction 10/2001  . SVT (supraventricular tachycardia)     ? h/o AFib;  patient on  long term amiodarone therapy  . PAF (paroxysmal atrial fibrillation)   . Anatomical narrow angle of right eye   . Heart murmur     "age [age]"  . Shortness of breath     "at times; related to CHF"  . Shortness of breath on exertion   . 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 Graciela Husbands.   [name] AICD (automatic cardioverter/defibrillator) present 10/2004    explant; implant  . Blood transfusion   . Anemia   . H/O hiatal hernia   . Stroke     History of TIA and possible CVA; hospitalized 2004; on coumadin  . Systolic heart failure   . Sinus bradycardia 03/05/2012    Past Surgical History  Procedure Laterality Date  . Back surgery    . Coronary angioplasty with stent  placement  10/2001; 09/2002  . Cardiac defibrillator placement  07/2003; 10/2004  . Lumbar laminectomy/decompression microdiscectomy  10/2001    L5-S1/E-chart  . Knee arthroscopy      left  . Thyroidectomy  1970's  . Total knee arthroplasty  11/2003    left  . X-stop implantation  12/2004    L3-4; L4-5  . Fixation kyphoplasty thoracic spine  07/2007    T3, 4, 6 compression fractures  . Shoulder open rotator cuff repair  04/2008    right  . Lumbar laminectomy/decompression microdiscectomy  01/2010  . Colectomy  03/2010    sigmoid left  . Colostomy  03/2010    transverse  . Peripherally inserted central catheter insertion  03/2010    removed upon discharge  . Colostomy closure  12/2010    reversal  . Tonsillectomy and adenoidectomy  1969  . Appendectomy  03/2010  . Cataract extraction      right    Current Outpatient Prescriptions  Medication Sig Dispense Refill  . amiodarone (PACERONE) 200 MG tablet Take 200 mg by mouth daily.      [name] aspirin EC 81 MG tablet Take 81 mg by mouth daily.      . calcium-vitamin D (OSCAL WITH D) 500-200 MG-UNIT per tablet Take 1 tablet by mouth 2 (two) times daily with a meal.      . carvedilol (COREG) 12.5 MG tablet Take 1 tablet (12.5 mg total) by mouth 2 (two) times daily with a meal.  60 tablet  6  . cyclobenzaprine (FLEXERIL) 5 MG tablet Take 5 mg by mouth 2 (two) times daily as needed. For muscle spasms.      [name] ezetimibe (ZETIA) 10 MG tablet Take 10 mg by mouth at bedtime.       . fexofenadine (ALLEGRA) 180 MG tablet Take 180 mg by mouth daily.       . fish oil-omega-3 fatty acids 1000 MG capsule Take 1,000 mg by mouth daily.        . fluticasone (FLONASE) 50 MCG/ACT nasal spray Place 2 sprays into the nose daily as needed. For allergies      . folic acid-pyridoxine-cyancobalamin (FOLTX) 2.5-25-2 MG TABS Take 1 tablet by mouth daily.        . furosemide (LASIX) 20 MG tablet Take 20 mg by mouth daily.      [name] HYDROcodone-acetaminophen (NORCO) 7.5-325 MG  per tablet Take 1 tablet by mouth every 8 (eight) hours as needed. For pain      . levothyroxine (SYNTHROID, LEVOTHROID) 200 MCG tablet Take 1 tablet (200 mcg total) by mouth daily.  30 tablet  6  .  lisinopril (PRINIVIL,ZESTRIL) 5 MG tablet Take 2.5 mg by mouth 2 (two) times daily.      . methocarbamol (ROBAXIN) 500 MG tablet Take 500 mg by mouth 4 (four) times daily as needed. For spasms      . nitroGLYCERIN (NITROSTAT) 0.4 MG SL tablet Place 0.4 mg under the tongue every 5 (five) minutes as needed. For chest pain      . pantoprazole (PROTONIX) 40 MG tablet Take 40 mg by mouth 2 (two) times daily.       . polyethylene glycol (MIRALAX / GLYCOLAX) packet Take 17 g by mouth daily as needed. For constipation      . potassium chloride SA (K-DUR,KLOR-CON) 20 MEQ tablet Take 20 mEq by mouth daily.      . pregabalin (LYRICA) 50 MG capsule Take 1 capsule (50 mg total) by mouth 3 (three) times daily.  90 capsule  5  . promethazine (PHENERGAN) 25 MG tablet Take 25 mg by mouth every 6 (six) hours as needed. For nausea      . raloxifene (EVISTA) 60 MG tablet Take 60 mg by mouth daily.      [name] spironolactone (ALDACTONE) 25 MG tablet Take 25 mg by mouth daily.      [name] warfarin (COUMADIN) 3 MG tablet Take 3-4.5 mg by mouth daily. Take 3 mg daily except on Wednesday and Friday and takes 4.5 mg       No current facility-administered medications for this visit.    Allergies  Allergen Reactions  . Lansoprazole Nausea Only  . Penicillins Swelling    Throat swells  . Statins Other (See Comments)    cramps  . Lactose Intolerance (Gi) Diarrhea and Other (See Comments)    Patient reports abdominal cramping and diarrhea with lactose products.     Review of Systems negative except from HPI and PMH  Physical Exam.vs BP 104/72  Pulse 86  Ht 5\' 6"  (1.676 m)  Wt 146 lb (66.225 kg)  BMI 23.58 kg/m2 Well developed and well nourished in no acute distress HENT normal E scleral and icterus clear Neck Supple JVP  flat; carotids brisk and full Clear to ausculation Device pocket well healed; without hematoma or erythema ith mild atrophy \\Regular  rate and rhythm, no murmurs gallops or rub Soft with active bowel sounds No clubbing cyanosis none Edema Alert and oriented, grossly normal motor and sensory function Skin Warm and Dry   ECG demonstrates sinus rhythm at 53 Intervals 16/08/54 Axis leftward -23 Prior inferolateral wall and anterolateral MI  assessment and  Plan

## 2012-05-23 NOTE — Telephone Encounter (Signed)
PC to [place] at [place] regarding need for followup on faxed form. LM on VM 05/23/12.

## 2012-05-23 NOTE — Assessment & Plan Note (Signed)
Clinically stable. He is currently on Aldactone. We'll review with Dr. [facility] whether he would prefer hydralazine nitrates. She is on ACE inhibitors and low-dose beta blockers.  We will try and decrease her Coreg and undertake a 24-hour monitor prior to device generator replacement to confirm the need for device upgrade.

## 2012-05-30 ENCOUNTER — Telehealth: Payer: Self-pay | Admitting: Internal Medicine

## 2012-05-30 DIAGNOSIS — R0600 Dyspnea, unspecified: Secondary | ICD-10-CM

## 2012-05-30 DIAGNOSIS — E039 Hypothyroidism, unspecified: Secondary | ICD-10-CM

## 2012-05-30 MED ORDER — LEVOTHYROXINE SODIUM 200 MCG PO TABS: 200.0000 ug | ORAL_TABLET | Freq: Every day | ORAL | Status: DC

## 2012-05-30 NOTE — Telephone Encounter (Signed)
Patient Information:  Caller Name: [place]  Phone: [phone]  Patient: [name], [name]  Gender: Female  DOB: 1951/01/10  Age: 62 Years  PCP: Sanda Linger (Adults only)  Office Follow Up:  Does the office need to follow up with this patient?: Yes  Instructions For The Office: Refill request for Synthroid.  Patient advised to check with pharmacy later today and f/u with office if needed.   Symptoms  Reason For Call & Symptoms: Patient states pharmacy has sent request in for refill of Synthroid with no response.  Nothing noted in emr.  Patient states has one days worth left.  Needs refill of daily medication:  Synthroid 200 Mcg Daily per emr.  Reviewed Health History In EMR: Yes  Reviewed Medications In EMR: Yes  Reviewed Allergies In EMR: Yes  Reviewed Surgeries / Procedures: N/A  Date of Onset of Symptoms: 05/30/2012  Guideline(s) Used:  No Protocol Available - Information Only  Disposition Per Guideline:   Discuss with PCP and Callback by Nurse Today  Reason For Disposition Reached:   Nursing judgment  Advice Given:  N/A

## 2012-05-30 NOTE — Telephone Encounter (Signed)
Rx sent to [facility] for 90 day supply, pt informed.

## 2012-06-06 ENCOUNTER — Observation Stay (HOSPITAL_COMMUNITY)
Admission: EM | Admit: 2012-06-06 | Discharge: 2012-06-08 | Disposition: A | Discharge status: Home / Self Care | Payer: Medicare Other | Attending: Interventional Cardiology | Admitting: Interventional Cardiology

## 2012-06-06 ENCOUNTER — Emergency Department (HOSPITAL_COMMUNITY): Payer: Medicare Other

## 2012-06-06 ENCOUNTER — Encounter (HOSPITAL_COMMUNITY): Payer: Self-pay | Admitting: *Deleted

## 2012-06-06 DIAGNOSIS — I4891 Unspecified atrial fibrillation: Principal | ICD-10-CM | POA: Insufficient documentation

## 2012-06-06 DIAGNOSIS — Z95810 Presence of automatic (implantable) cardiac defibrillator: Secondary | ICD-10-CM | POA: Diagnosis present

## 2012-06-06 DIAGNOSIS — N183 Chronic kidney disease, stage 3 unspecified: Secondary | ICD-10-CM | POA: Insufficient documentation

## 2012-06-06 DIAGNOSIS — I495 Sick sinus syndrome: Secondary | ICD-10-CM | POA: Insufficient documentation

## 2012-06-06 DIAGNOSIS — E039 Hypothyroidism, unspecified: Secondary | ICD-10-CM | POA: Insufficient documentation

## 2012-06-06 DIAGNOSIS — Z4502 Encounter for adjustment and management of automatic implantable cardiac defibrillator: Secondary | ICD-10-CM | POA: Insufficient documentation

## 2012-06-06 DIAGNOSIS — I129 Hypertensive chronic kidney disease with stage 1 through stage 4 chronic kidney disease, or unspecified chronic kidney disease: Secondary | ICD-10-CM | POA: Insufficient documentation

## 2012-06-06 DIAGNOSIS — I5022 Chronic systolic (congestive) heart failure: Secondary | ICD-10-CM | POA: Insufficient documentation

## 2012-06-06 DIAGNOSIS — I2589 Other forms of chronic ischemic heart disease: Secondary | ICD-10-CM | POA: Insufficient documentation

## 2012-06-06 DIAGNOSIS — Z7901 Long term (current) use of anticoagulants: Secondary | ICD-10-CM | POA: Insufficient documentation

## 2012-06-06 DIAGNOSIS — R079 Chest pain, unspecified: Secondary | ICD-10-CM | POA: Insufficient documentation

## 2012-06-06 DIAGNOSIS — Z8673 Personal history of transient ischemic attack (TIA), and cerebral infarction without residual deficits: Secondary | ICD-10-CM | POA: Insufficient documentation

## 2012-06-06 DIAGNOSIS — M1990 Unspecified osteoarthritis, unspecified site: Secondary | ICD-10-CM | POA: Insufficient documentation

## 2012-06-06 DIAGNOSIS — M4800 Spinal stenosis, site unspecified: Secondary | ICD-10-CM | POA: Insufficient documentation

## 2012-06-06 LAB — CBC WITH DIFFERENTIAL/PLATELET
Basophils Absolute: 0 10*3/uL (ref 0.0–0.1)
Eosinophils Absolute: 0.1 10*3/uL (ref 0.0–0.7)
Eosinophils Relative: 2 % (ref 0–5)
Lymphs Abs: 1.2 10*3/uL (ref 0.7–4.0)
MCH: 34.1 pg — ABNORMAL HIGH (ref 26.0–34.0)
MCV: 95.9 fL (ref 78.0–100.0)
Neutrophils Relative %: 43 % (ref 43–77)
Platelets: 162 10*3/uL (ref 150–400)
RBC: 3.87 MIL/uL (ref 3.87–5.11)
RDW: 16.5 % — ABNORMAL HIGH (ref 11.5–15.5)
WBC: 3.7 10*3/uL — ABNORMAL LOW (ref 4.0–10.5)

## 2012-06-06 LAB — COMPREHENSIVE METABOLIC PANEL
ALT: 21 U/L (ref 0–35)
AST: 43 U/L — ABNORMAL HIGH (ref 0–37)
Albumin: 3.4 g/dL — ABNORMAL LOW (ref 3.5–5.2)
Alkaline Phosphatase: 58 U/L (ref 39–117)
Calcium: 8.6 mg/dL (ref 8.4–10.5)
Glucose, Bld: 119 mg/dL — ABNORMAL HIGH (ref 70–99)
Potassium: 3.9 mEq/L (ref 3.5–5.1)
Sodium: 137 mEq/L (ref 135–145)
Total Protein: 6.7 g/dL (ref 6.0–8.3)

## 2012-06-06 LAB — PRO B NATRIURETIC PEPTIDE: Pro B Natriuretic peptide (BNP): 1495 pg/mL — ABNORMAL HIGH (ref 0–125)

## 2012-06-06 LAB — MRSA PCR SCREENING: MRSA by PCR: NEGATIVE

## 2012-06-06 LAB — TROPONIN I: Troponin I: <0.3 ng/mL (ref <0.30)

## 2012-06-06 MED ORDER — PROMETHAZINE HCL 25 MG PO TABS: 25.0000 mg | ORAL_TABLET | Freq: Four times a day (QID) | ORAL | Status: DC | PRN

## 2012-06-06 MED ORDER — PYRIDOXINE HCL 25 MG PO TABS
25.0000 mg | ORAL_TABLET | Freq: Every day | ORAL | Status: DC
  Administered 2012-06-06 – 2012-06-08 (×3): 25 mg via ORAL
  Filled 2012-06-06 (×3): qty 1

## 2012-06-06 MED ORDER — CARVEDILOL 3.125 MG PO TABS: 3.1250 mg | ORAL_TABLET | Freq: Two times a day (BID) | ORAL | Status: DC

## 2012-06-06 MED ORDER — NITROGLYCERIN 0.4 MG SL SUBL
SUBLINGUAL_TABLET | SUBLINGUAL | Status: AC
  Administered 2012-06-06: 0.4 mg
  Filled 2012-06-06: qty 75

## 2012-06-06 MED ORDER — POLYETHYLENE GLYCOL 3350 17 G PO PACK
17.0000 g | PACK | Freq: Every day | ORAL | Status: DC | PRN
  Filled 2012-06-06: qty 1

## 2012-06-06 MED ORDER — LORATADINE 10 MG PO TABS
10.0000 mg | ORAL_TABLET | Freq: Every day | ORAL | Status: DC
  Administered 2012-06-06 – 2012-06-08 (×3): 10 mg via ORAL
  Filled 2012-06-06 (×3): qty 1

## 2012-06-06 MED ORDER — ASPIRIN 81 MG PO CHEW: 324.0000 mg | CHEWABLE_TABLET | ORAL | Status: DC

## 2012-06-06 MED ORDER — NITROGLYCERIN 0.4 MG SL SUBL
0.4000 mg | SUBLINGUAL_TABLET | SUBLINGUAL | Status: DC | PRN
  Administered 2012-06-06: 0.4 mg via SUBLINGUAL

## 2012-06-06 MED ORDER — ZOLPIDEM TARTRATE 5 MG PO TABS: 5.0000 mg | ORAL_TABLET | Freq: Every evening | ORAL | Status: DC | PRN

## 2012-06-06 MED ORDER — EZETIMIBE 10 MG PO TABS
10.0000 mg | ORAL_TABLET | Freq: Every day | ORAL | Status: DC
  Administered 2012-06-06 – 2012-06-07 (×2): 10 mg via ORAL
  Filled 2012-06-06 (×3): qty 1

## 2012-06-06 MED ORDER — POTASSIUM CHLORIDE CRYS ER 20 MEQ PO TBCR
20.0000 meq | EXTENDED_RELEASE_TABLET | Freq: Every day | ORAL | Status: DC
  Administered 2012-06-06 – 2012-06-08 (×2): 20 meq via ORAL
  Filled 2012-06-06 (×3): qty 1

## 2012-06-06 MED ORDER — METHOCARBAMOL 500 MG PO TABS
500.0000 mg | ORAL_TABLET | Freq: Four times a day (QID) | ORAL | Status: DC | PRN
Start: 2012-06-06 — End: 2012-06-08
  Filled 2012-06-06: qty 1

## 2012-06-06 MED ORDER — CALCIUM CARBONATE-VITAMIN D 500-200 MG-UNIT PO TABS
1.0000 | ORAL_TABLET | Freq: Two times a day (BID) | ORAL | Status: DC
  Administered 2012-06-07 – 2012-06-08 (×2): 1 via ORAL
  Filled 2012-06-06 (×5): qty 1

## 2012-06-06 MED ORDER — METOPROLOL TARTRATE 1 MG/ML IV SOLN
INTRAVENOUS | Status: AC
  Administered 2012-06-06: 5 mg
  Filled 2012-06-06: qty 5

## 2012-06-06 MED ORDER — ONDANSETRON HCL 4 MG/2ML IJ SOLN: 4.0000 mg | Freq: Four times a day (QID) | INTRAMUSCULAR | Status: DC | PRN

## 2012-06-06 MED ORDER — NITROGLYCERIN 0.4 MG SL SUBL: 0.4000 mg | SUBLINGUAL_TABLET | SUBLINGUAL | Status: DC | PRN

## 2012-06-06 MED ORDER — FOLIC ACID 1 MG PO TABS
2.0000 mg | ORAL_TABLET | Freq: Every day | ORAL | Status: DC
  Administered 2012-06-06 – 2012-06-08 (×3): 2 mg via ORAL
  Filled 2012-06-06 (×4): qty 2

## 2012-06-06 MED ORDER — HYDROCODONE-ACETAMINOPHEN 7.5-325 MG PO TABS: 1.0000 | ORAL_TABLET | Freq: Three times a day (TID) | ORAL | Status: DC | PRN

## 2012-06-06 MED ORDER — VITAMIN B-12 1000 MCG PO TABS
2000.0000 ug | ORAL_TABLET | Freq: Every day | ORAL | Status: DC
  Administered 2012-06-06 – 2012-06-08 (×3): 2000 ug via ORAL
  Filled 2012-06-06 (×3): qty 2

## 2012-06-06 MED ORDER — PREGABALIN 25 MG PO CAPS
50.0000 mg | ORAL_CAPSULE | Freq: Three times a day (TID) | ORAL | Status: DC
  Administered 2012-06-06 – 2012-06-08 (×3): 50 mg via ORAL
  Filled 2012-06-06 (×2): qty 2
  Filled 2012-06-06: qty 1

## 2012-06-06 MED ORDER — DILTIAZEM HCL 25 MG/5ML IV SOLN
10.0000 mg | Freq: Once | INTRAVENOUS | Status: AC
  Administered 2012-06-06: 10 mg via INTRAVENOUS
  Filled 2012-06-06: qty 5

## 2012-06-06 MED ORDER — ASPIRIN 300 MG RE SUPP
300.0000 mg | RECTAL | Status: DC
  Filled 2012-06-06: qty 1

## 2012-06-06 MED ORDER — OMEGA-3-ACID ETHYL ESTERS 1 G PO CAPS
1.0000 g | ORAL_CAPSULE | Freq: Every day | ORAL | Status: DC
  Administered 2012-06-06 – 2012-06-08 (×3): 1 g via ORAL
  Filled 2012-06-06 (×3): qty 1

## 2012-06-06 MED ORDER — FA-PYRIDOXINE-CYANOCOBALAMIN 2.5-25-2 MG PO TABS: 1.0000 | ORAL_TABLET | Freq: Every day | ORAL | Status: DC

## 2012-06-06 MED ORDER — RALOXIFENE HCL 60 MG PO TABS
60.0000 mg | ORAL_TABLET | Freq: Every day | ORAL | Status: DC
  Administered 2012-06-06 – 2012-06-08 (×2): 60 mg via ORAL
  Filled 2012-06-06 (×3): qty 1

## 2012-06-06 MED ORDER — ONDANSETRON HCL 4 MG/2ML IJ SOLN
4.0000 mg | Freq: Once | INTRAMUSCULAR | Status: AC
  Administered 2012-06-06: 4 mg via INTRAVENOUS
  Filled 2012-06-06: qty 2

## 2012-06-06 MED ORDER — FUROSEMIDE 20 MG PO TABS
20.0000 mg | ORAL_TABLET | Freq: Every day | ORAL | Status: DC
  Administered 2012-06-06 – 2012-06-08 (×3): 20 mg via ORAL
  Filled 2012-06-06 (×3): qty 1

## 2012-06-06 MED ORDER — LISINOPRIL 2.5 MG PO TABS
2.5000 mg | ORAL_TABLET | Freq: Two times a day (BID) | ORAL | Status: DC
  Administered 2012-06-06 – 2012-06-08 (×3): 2.5 mg via ORAL
  Filled 2012-06-06 (×5): qty 1

## 2012-06-06 MED ORDER — METOPROLOL TARTRATE 1 MG/ML IV SOLN: 5.0000 mg | Freq: Once | INTRAVENOUS | Status: DC

## 2012-06-06 MED ORDER — ASPIRIN EC 81 MG PO TBEC
81.0000 mg | DELAYED_RELEASE_TABLET | Freq: Every day | ORAL | Status: DC
  Administered 2012-06-07: 81 mg via ORAL
  Filled 2012-06-06: qty 1

## 2012-06-06 MED ORDER — ALPRAZOLAM 0.25 MG PO TABS: 0.2500 mg | ORAL_TABLET | Freq: Two times a day (BID) | ORAL | Status: DC | PRN

## 2012-06-06 MED ORDER — AMIODARONE HCL 200 MG PO TABS
200.0000 mg | ORAL_TABLET | Freq: Every day | ORAL | Status: DC
  Administered 2012-06-06 – 2012-06-08 (×3): 200 mg via ORAL
  Filled 2012-06-06 (×3): qty 1

## 2012-06-06 MED ORDER — SPIRONOLACTONE 25 MG PO TABS
25.0000 mg | ORAL_TABLET | Freq: Every day | ORAL | Status: DC
  Administered 2012-06-07 – 2012-06-08 (×2): 25 mg via ORAL
  Filled 2012-06-06 (×3): qty 1

## 2012-06-06 MED ORDER — ACETAMINOPHEN 325 MG PO TABS: 650.0000 mg | ORAL_TABLET | ORAL | Status: DC | PRN

## 2012-06-06 MED ORDER — LEVOTHYROXINE SODIUM 200 MCG PO TABS
200.0000 ug | ORAL_TABLET | Freq: Every day | ORAL | Status: DC
  Administered 2012-06-06 – 2012-06-08 (×3): 200 ug via ORAL
  Filled 2012-06-06 (×4): qty 1

## 2012-06-06 MED ORDER — SODIUM CHLORIDE 0.9 % IV SOLN
INTRAVENOUS | Status: DC
  Administered 2012-06-06: 14:00:00 via INTRAVENOUS

## 2012-06-06 MED ORDER — CYCLOBENZAPRINE HCL 10 MG PO TABS: 5.0000 mg | ORAL_TABLET | Freq: Two times a day (BID) | ORAL | Status: DC | PRN

## 2012-06-06 MED ORDER — FLUTICASONE PROPIONATE 50 MCG/ACT NA SUSP: 1.0000 | Freq: Every day | NASAL | Status: DC | PRN

## 2012-06-06 MED ORDER — ASPIRIN EC 81 MG PO TBEC: 81.0000 mg | DELAYED_RELEASE_TABLET | Freq: Every day | ORAL | Status: DC

## 2012-06-06 MED ORDER — PANTOPRAZOLE SODIUM 40 MG PO TBEC
40.0000 mg | DELAYED_RELEASE_TABLET | Freq: Two times a day (BID) | ORAL | Status: DC
  Administered 2012-06-06 – 2012-06-08 (×4): 40 mg via ORAL
  Filled 2012-06-06 (×4): qty 1

## 2012-06-06 MED ORDER — MORPHINE SULFATE 2 MG/ML IJ SOLN
2.0000 mg | Freq: Once | INTRAMUSCULAR | Status: AC
  Administered 2012-06-06: 2 mg via INTRAVENOUS
  Filled 2012-06-06: qty 1

## 2012-06-06 MED ORDER — CARVEDILOL 6.25 MG PO TABS
6.2500 mg | ORAL_TABLET | Freq: Two times a day (BID) | ORAL | Status: DC
  Administered 2012-06-07 – 2012-06-08 (×2): 6.25 mg via ORAL
  Filled 2012-06-06 (×6): qty 1

## 2012-06-06 NOTE — ED Provider Notes (Signed)
History     CSN: [id number]  Arrival date & time 06/06/12  1241   First MD Initiated Contact with Patient 06/06/12 1250      Chief Complaint  Patient presents with  . Atrial Fibrillation  . Chest Pain    (Consider location/radiation/quality/duration/timing/severity/associated sxs/prior treatment) The history is provided by the patient and the EMS personnel.   patient brought in by EMS for chest pressure 7/10 that started this morning and for rapid heart rate. When EMS got there her heart rate was 150/160 consistent with atrial fib. Patient did give Cartia Zantac 20 mg orally nitroglycerin x1 and 324 mg of aspirin. Patient is followed by [facility] and they'll be cardiology. She has a pacemaker defibrillator the pacemaker defibrillator battery is coming to the end of its life and they are planning to replace at the end of this month. Patient has known significant coronary artery disease. Patient is on Coumadin. The chest pain 7/10 with pressure substernal area nonradiating not made worse or better by anything.  Past Medical History  Diagnosis Date  . Spinal stenosis   . Numbness of foot     left foot  . Diverticulitis     Status post partial colectomy 1/12 with reversal in July 2012  . Colitis, ischemic   . DJD (degenerative joint disease)     History of multiple surgeries to the back, shoulder and knee  . Ischemic cardiomyopathy     Echo 06/16/10: EF 25-30%, anteroseptal and apical hypokinesis, moderate AI, mild MR  . Chronic systolic heart failure   . CAD (coronary artery disease)     a.  Ant MI 8/03 with stenting of the LAD;  b. staged PCI with Cypher DES to OM1 8/03;  c. s/p Cypher DES to LAD 7/04;  d.  multiple cardiac caths in past (chronically abnl ECG);    e. cardiac catheterization 3/12: LAD stent patent, distal LAD 40-50%, small ostial D1 80%, ostial D2 60%, OM-1 stent patent (20-30%), proximal RCA 50%, proximal to mid RCA 40-50%; f. cath 02/17/2011 - nonobs  . LV  (left ventricular) mural thrombus     Chronic Coumadin therapy  . CKD (chronic kidney disease), stage III     creatinine:  1.3 in 4/12;    . Tobacco abuse     history  . GERD (gastroesophageal reflux disease)   . Hypertension   . Lower GI bleed     August 2011  . Hypothyroidism   . Aortic insufficiency   . Pseudoaneurysm     History of, right forearm  . Hyperlipidemia   . OA (osteoarthritis)   . Abnormal EKG     Chronically abnormal EKG.   . Spinal stenosis   . Numbness of foot     Left foot  . Compression fracture 07/04/11    L2  . Anterior myocardial infarction 10/2001  . SVT (supraventricular tachycardia)     ? h/o AFib;  patient on long term amiodarone therapy  . PAF (paroxysmal atrial fibrillation)   . Anatomical narrow angle of right eye   . Heart murmur     "age [age]"  . Shortness of breath     "at times; related to CHF"  . Shortness of breath on exertion   . 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 Graciela Husbands.   [name] AICD (automatic cardioverter/defibrillator) present 10/2004    explant; implant  . Blood transfusion   . Anemia   . H/O hiatal  hernia   . Stroke     History of TIA and possible CVA; hospitalized 2004; on coumadin  . Systolic heart failure   . Sinus bradycardia 03/05/2012    Past Surgical History  Procedure Laterality Date  . Back surgery    . Coronary angioplasty with stent placement  10/2001; 09/2002  . Cardiac defibrillator placement  07/2003; 10/2004  . Lumbar laminectomy/decompression microdiscectomy  10/2001    L5-S1/E-chart  . Knee arthroscopy      left  . Thyroidectomy  1970's  . Total knee arthroplasty  11/2003    left  . X-stop implantation  12/2004    L3-4; L4-5  . Fixation kyphoplasty thoracic spine  07/2007    T3, 4, 6 compression fractures  . Shoulder open rotator cuff repair  04/2008    right  . Lumbar laminectomy/decompression microdiscectomy  01/2010  . Colectomy  03/2010    sigmoid left  .  Colostomy  03/2010    transverse  . Peripherally inserted central catheter insertion  03/2010    removed upon discharge  . Colostomy closure  12/2010    reversal  . Tonsillectomy and adenoidectomy  1969  . Appendectomy  03/2010  . Cataract extraction      right    Family History  Problem Relation Age of Onset  . Diabetes Mother   . Diabetes Brother   . Arthritis      family history  . Prostate cancer      Family History    History  Substance Use Topics  . Smoking status: Former Smoker -- 0.10 packs/day for 33 years    Types: Cigarettes    Quit date: 04/21/2001  . Smokeless tobacco: Never Used     Comment: "smoked ~ 1 pack/month"  . Alcohol Use: No     Comment: 07/05/11 "occasionally; last time was glass of wine last week"    OB History   Grav Para Term Preterm Abortions TAB SAB Ect Mult Living                  Review of Systems  Constitutional: Positive for fatigue. Negative for fever.  HENT: Negative for neck pain.   Eyes: Negative for visual disturbance.  Respiratory: Positive for chest tightness. Negative for shortness of breath.   Cardiovascular: Positive for chest pain and palpitations. Negative for leg swelling.  Gastrointestinal: Negative for abdominal pain.  Genitourinary: Negative for dysuria.  Skin: Negative for rash.  Neurological: Negative for headaches.  Hematological: Does not bruise/bleed easily.  Psychiatric/Behavioral: Negative for confusion.    Allergies  Lansoprazole; Penicillins; Statins; and Lactose intolerance (gi)  Home Medications   Current Outpatient Rx  Name  Route  Sig  Dispense  Refill  . amiodarone (PACERONE) 200 MG tablet   Oral   Take 200 mg by mouth daily.         [name] aspirin EC 81 MG tablet   Oral   Take 81 mg by mouth daily.         . calcium-vitamin D (OSCAL WITH D) 500-200 MG-UNIT per tablet   Oral   Take 1 tablet by mouth 2 (two) times daily with a meal.         . carvedilol (COREG) 3.125 MG tablet   Oral    Take 3.125 mg by mouth 2 (two) times daily with a meal.         . cyclobenzaprine (FLEXERIL) 5 MG tablet   Oral   Take 5 mg by  mouth 2 (two) times daily as needed for muscle spasms.          [name] ezetimibe (ZETIA) 10 MG tablet   Oral   Take 10 mg by mouth at bedtime.          . fexofenadine (ALLEGRA) 180 MG tablet   Oral   Take 180 mg by mouth daily.          . fluticasone (FLONASE) 50 MCG/ACT nasal spray   Nasal   Place 2 sprays into the nose daily as needed for allergies.          . folic acid-pyridoxine-cyancobalamin (FOLTX) 2.5-25-2 MG TABS   Oral   Take 1 tablet by mouth daily.          . furosemide (LASIX) 20 MG tablet   Oral   Take 20 mg by mouth daily.         [name] HYDROcodone-acetaminophen (NORCO) 7.5-325 MG per tablet   Oral   Take 1 tablet by mouth every 8 (eight) hours as needed for pain.         [name] levothyroxine (SYNTHROID, LEVOTHROID) 200 MCG tablet   Oral   Take 200 mcg by mouth daily.         [name] lisinopril (PRINIVIL,ZESTRIL) 5 MG tablet   Oral   Take 2.5 mg by mouth 2 (two) times daily.         . methocarbamol (ROBAXIN) 500 MG tablet   Oral   Take 500 mg by mouth 4 (four) times daily as needed (for spasms).          [name] omega-3 acid ethyl esters (LOVAZA) 1 G capsule   Oral   Take 1 g by mouth daily.         . pantoprazole (PROTONIX) 40 MG tablet   Oral   Take 40 mg by mouth 2 (two) times daily.          . polyethylene glycol (MIRALAX / GLYCOLAX) packet   Oral   Take 17 g by mouth daily as needed (for constipation).          . potassium chloride SA (K-DUR,KLOR-CON) 20 MEQ tablet   Oral   Take 20 mEq by mouth daily.         . pregabalin (LYRICA) 50 MG capsule   Oral   Take 50 mg by mouth 3 (three) times daily.         . promethazine (PHENERGAN) 25 MG tablet   Oral   Take 25 mg by mouth every 6 (six) hours as needed for nausea.          . raloxifene (EVISTA) 60 MG tablet   Oral   Take 60 mg by mouth daily.          [name] spironolactone (ALDACTONE) 25 MG tablet   Oral   Take 25 mg by mouth daily.         [name] warfarin (COUMADIN) 3 MG tablet   Oral   Take 3-4.5 mg by mouth daily. Takes 3mg  every day, except on Wednesday and Friday takes 4.5mg          . nitroGLYCERIN (NITROSTAT) 0.4 MG SL tablet   Sublingual   Place 0.4 mg under the tongue every 5 (five) minutes as needed for chest pain.            BP 119/71  Pulse 106  Temp(Src) 99.3 F (37.4 C) (Oral)  Resp 12  Ht 5\' 6"  (1.676 m)  Wt  143 lb (64.864 kg)  BMI 23.09 kg/m2  SpO2 100%  Physical Exam  Nursing note and vitals reviewed. Constitutional: She is oriented to person, place, and time. She appears well-developed and well-nourished.  HENT:  Head: Normocephalic and atraumatic.  Mouth/Throat: Oropharynx is clear and moist.  Eyes: Conjunctivae and EOM are normal. Pupils are equal, round, and reactive to light.  Neck: Normal range of motion.  Cardiovascular: Normal rate and intact distal pulses.   No murmur heard. Irregular and at times tachycardic.  Pulmonary/Chest: Effort normal and breath sounds normal. No respiratory distress.  Abdominal: Soft. There is no tenderness.  Musculoskeletal: Normal range of motion. She exhibits no edema.  Neurological: She is alert and oriented to person, place, and time. No cranial nerve deficit. She exhibits normal muscle tone. Coordination normal.  Skin: Skin is warm. No rash noted.    ED Course  Procedures (including critical care time)  Labs Reviewed  CBC WITH DIFFERENTIAL - Abnormal; Notable for the following:    WBC 3.7 (*)    MCH 34.1 (*)    RDW 16.5 (*)    Neutro Abs 1.6 (*)    Monocytes Relative 23 (*)    All other components within normal limits  COMPREHENSIVE METABOLIC PANEL - Abnormal; Notable for the following:    Glucose, Bld 119 (*)    Albumin 3.4 (*)    AST 43 (*)    GFR calc non Af Amer 61 (*)    GFR calc Af Amer 71 (*)    All other components within normal limits   PROTIME-INR - Abnormal; Notable for the following:    Prothrombin Time 29.0 (*)    INR 2.92 (*)    All other components within normal limits  PRO B NATRIURETIC PEPTIDE - Abnormal; Notable for the following:    Pro B Natriuretic peptide (BNP) 1495.0 (*)    All other components within normal limits  TROPONIN I  TROPONIN I   Dg Chest Port 1 View  06/06/2012  *RADIOLOGY REPORT*  Clinical Data: Atrial fibrillation, chest pain  PORTABLE CHEST - 1 VIEW  Comparison: 12/28/ 13  Findings: Cardiomegaly again noted.  Single lead cardiac pacemaker is unchanged in position.  No acute infiltrate or pleural effusion. No pulmonary edema.  Mild elevation of the right hemidiaphragm again noted.  Mild hyperinflation. Prior vertebroplasty upper thoracic spine.  IMPRESSION: No active disease.  No significant change.  Cardiomegaly again noted.  Hyperinflation.   Original Report Authenticated By: Natasha Mead, M.D.    Results for orders placed during the hospital encounter of 06/06/12  TROPONIN I      Result Value Range   Troponin I <0.30  <0.30 ng/mL  CBC WITH DIFFERENTIAL      Result Value Range   WBC 3.7 (*) 4.0 - 10.5 K/uL   RBC 3.87  3.87 - 5.11 MIL/uL   Hemoglobin 13.2  12.0 - 15.0 g/dL   HCT 65.7  84.6 - 96.2 %   MCV 95.9  78.0 - 100.0 fL   MCH 34.1 (*) 26.0 - 34.0 pg   MCHC 35.6  30.0 - 36.0 g/dL   RDW 95.2 (*) 84.1 - 32.4 %   Platelets 162  150 - 400 K/uL   Neutrophils Relative 43  43 - 77 %   Neutro Abs 1.6 (*) 1.7 - 7.7 K/uL   Lymphocytes Relative 33  12 - 46 %   Lymphs Abs 1.2  0.7 - 4.0 K/uL   Monocytes Relative 23 (*)  3 - 12 %   Monocytes Absolute 0.9  0.1 - 1.0 K/uL   Eosinophils Relative 2  0 - 5 %   Eosinophils Absolute 0.1  0.0 - 0.7 K/uL   Basophils Relative 0  0 - 1 %   Basophils Absolute 0.0  0.0 - 0.1 K/uL  COMPREHENSIVE METABOLIC PANEL      Result Value Range   Sodium 137  135 - 145 mEq/L   Potassium 3.9  3.5 - 5.1 mEq/L   Chloride 102  96 - 112 mEq/L   CO2 23  19 - 32 mEq/L    Glucose, Bld 119 (*) 70 - 99 mg/dL   BUN 13  6 - 23 mg/dL   Creatinine, Ser 9.60  0.50 - 1.10 mg/dL   Calcium 8.6  8.4 - 45.4 mg/dL   Total Protein 6.7  6.0 - 8.3 g/dL   Albumin 3.4 (*) 3.5 - 5.2 g/dL   AST 43 (*) 0 - 37 U/L   ALT 21  0 - 35 U/L   Alkaline Phosphatase 58  39 - 117 U/L   Total Bilirubin 0.4  0.3 - 1.2 mg/dL   GFR calc non Af Amer 61 (*) >90 mL/min   GFR calc Af Amer 71 (*) >90 mL/min  PROTIME-INR      Result Value Range   Prothrombin Time 29.0 (*) 11.6 - 15.2 seconds   INR 2.92 (*) 0.00 - 1.49  PRO B NATRIURETIC PEPTIDE      Result Value Range   Pro B Natriuretic peptide (BNP) 1495.0 (*) 0 - 125 pg/mL  TROPONIN I      Result Value Range   Troponin I <0.30  <0.30 ng/mL    Date: 06/06/2012  Rate: 109  Rhythm: atrial fibrillation  QRS Axis: normal  Intervals: normal  ST/T Wave abnormalities: nonspecific ST/T changes  Conduction Disutrbances:none  Narrative Interpretation:   Old EKG Reviewed: changes noted Today's EKG with the old inferior infarct pattern. And anterior lateral infarct of more recent but not acute. Patient's EKG from 05/23/2012 different morphology that EKG originally was concerning for inferior injury pattern and possible anterior infarct. Was read as acute MI but patient did not have acute MI at that time.   1. Atrial fibrillation with rapid ventricular response     CRITICAL CARE Performed by: Shelda Jakes.   Total critical care time: 30  Critical care time was exclusive of separately billable procedures and treating other patients.  Critical care was necessary to treat or prevent imminent or life-threatening deterioration.  Critical care was time spent personally by me on the following activities: development of treatment plan with patient and/or surrogate as well as nursing, discussions with consultants, evaluation of patient's response to treatment, examination of patient, obtaining history from patient or surrogate, ordering  and performing treatments and interventions, ordering and review of laboratory studies, ordering and review of radiographic studies, pulse oximetry and re-evaluation of patient's condition.   MDM   As per EMS patient had rapid heart rate in around the 150-160 range. Was given Cardizem a.m. 20 mg by mouth and nitroglycerin 1 sublingual for the chest pressure in route by EMS. Heart rate improved significantly when she came in she was around 100-114 occasional bouts of atrial fib take her heart rate up to 128. Patient's chest pressure still remained but was improved some. Also patient was given aspirin by EMS. Initially no treatment for the atrial fib is provided here due to she recently had by mouth  Cartia Zantac however after observing her by monitoring occasionally her heart rate would remain above 100 and a little higher so an IV dose of Cardizem 10 mg was provided here. Contacted the cardiology she is followed [facility] they will see and admit. Patient has a pacemaker defibrillator in place they know that the battery is at the end of its life and they are preparing her for replacement of this. Was supposed to happen by the end of the month. [facility] has been involved with that.  The patient's cardiac workup here today troponin is negative x2 no evidence of acute coronary event 2 to the chest pressure. EKG without any acute changes. Patient's labs without any significant abnormalities. Chest x-rays negative for any pneumonia pulmonary edema or pneumothorax.  Patient's INR was appropriate at 2.6. Patient is on Coumadin for the atrial fib.       Shelda Jakes, MD 06/06/12 [phone]

## 2012-06-06 NOTE — ED Notes (Signed)
Pt. Here with complaint of chest pain that started at 11:00.  Pt took 1 NTG SL and called 911.  [facility] arrived completed 12 lead EKG,  pt in Afib with RVR and complaining of chest pain.  IV started. Cardizem 20 mg, NTG x 1 SL , ASA 324 mg given and pt transported [facility].

## 2012-06-06 NOTE — H&P (Addendum)
Admit date: 06/06/2012 Referring Physician  Dr. Deretha Emory Primary Cardiologist  Alanda Amass Katrinka Blazing III Chief complaint/reason for admission:AFib  HPI: 62 y/o who has had a cardiomyopathy and AICD.  SHe needs a Landscape architect which is being coordinated with Dr. Graciela Husbands.  This morning, she checked her vital signs and was found to have an elevated heart rate for her in the 90s, while her normal heart rate is in the 50s.  Her low pressure was also higher than usual.  She has a monitoring service and they instructed her to go to the emergency room.  She has had atrial fibrillation in the past but has not had rapid palpitations like this recently.  She felt some chest pressure when her heart rate was fast.  After she received rate slowing drugs with EMS, her pressure resolved.  Currently, she has no pressure.  She is not feeling any palpitations.  She has no shortness of breath.  She has not felt any lower extremity edema either.    PMH:    Past Medical History  Diagnosis Date  . Spinal stenosis   . Numbness of foot     left foot  . Diverticulitis     Status post partial colectomy 1/12 with reversal in July 2012  . Colitis, ischemic   . DJD (degenerative joint disease)     History of multiple surgeries to the back, shoulder and knee  . Ischemic cardiomyopathy     Echo 06/16/10: EF 25-30%, anteroseptal and apical hypokinesis, moderate AI, mild MR  . Chronic systolic heart failure   . CAD (coronary artery disease)     a.  Ant MI 8/03 with stenting of the LAD;  b. staged PCI with Cypher DES to OM1 8/03;  c. s/p Cypher DES to LAD 7/04;  d.  multiple cardiac caths in past (chronically abnl ECG);    e. cardiac catheterization 3/12: LAD stent patent, distal LAD 40-50%, small ostial D1 80%, ostial D2 60%, OM-1 stent patent (20-30%), proximal RCA 50%, proximal to mid RCA 40-50%; f. cath 02/17/2011 - nonobs  . LV (left ventricular) mural thrombus     Chronic Coumadin therapy  . CKD (chronic kidney disease),  stage III     creatinine:  1.3 in 4/12;    . Tobacco abuse     history  . GERD (gastroesophageal reflux disease)   . Hypertension   . Lower GI bleed     August 2011  . Hypothyroidism   . Aortic insufficiency   . Pseudoaneurysm     History of, right forearm  . Hyperlipidemia   . OA (osteoarthritis)   . Abnormal EKG     Chronically abnormal EKG.   . Spinal stenosis   . Numbness of foot     Left foot  . Compression fracture 07/04/11    L2  . Anterior myocardial infarction 10/2001  . SVT (supraventricular tachycardia)     ? h/o AFib;  patient on long term amiodarone therapy  . PAF (paroxysmal atrial fibrillation)   . Anatomical narrow angle of right eye   . Heart murmur     "age [age]"  . Shortness of breath     "at times; related to CHF"  . Shortness of breath on exertion   . 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 Graciela Husbands.   [name] AICD (automatic cardioverter/defibrillator) present 10/2004    explant; implant  . Blood transfusion   . Anemia   . H/O  hiatal hernia   . Stroke     History of TIA and possible CVA; hospitalized 2004; on coumadin  . Systolic heart failure   . Sinus bradycardia 03/05/2012    PSH:    Past Surgical History  Procedure Laterality Date  . Back surgery    . Coronary angioplasty with stent placement  10/2001; 09/2002  . Cardiac defibrillator placement  07/2003; 10/2004  . Lumbar laminectomy/decompression microdiscectomy  10/2001    L5-S1/E-chart  . Knee arthroscopy      left  . Thyroidectomy  1970's  . Total knee arthroplasty  11/2003    left  . X-stop implantation  12/2004    L3-4; L4-5  . Fixation kyphoplasty thoracic spine  07/2007    T3, 4, 6 compression fractures  . Shoulder open rotator cuff repair  04/2008    right  . Lumbar laminectomy/decompression microdiscectomy  01/2010  . Colectomy  03/2010    sigmoid left  . Colostomy  03/2010    transverse  . Peripherally inserted central catheter insertion  03/2010     removed upon discharge  . Colostomy closure  12/2010    reversal  . Tonsillectomy and adenoidectomy  1969  . Appendectomy  03/2010  . Cataract extraction      right    ALLERGIES:   Lansoprazole; Penicillins; Statins; and Lactose intolerance (gi)  Prior to Admit Meds:   (Not in a hospital admission) Family HX:    Family History  Problem Relation Age of Onset  . Diabetes Mother   . Diabetes Brother   . Arthritis      family history  . Prostate cancer      Family History   Social HX:    History   Social History  . Marital Status: Widowed    Spouse Name: N/A    Number of Children: N/A  . Years of Education: N/A   Occupational History  . Not on file.   Social History Main Topics  . Smoking status: Former Smoker -- 0.10 packs/day for 33 years    Types: Cigarettes    Quit date: 04/21/2001  . Smokeless tobacco: Never Used     Comment: "smoked ~ 1 pack/month"  . Alcohol Use: No     Comment: 07/05/11 "occasionally; last time was glass of wine last week"  . Drug Use: No  . Sexually Active: Yes   Other Topics Concern  . Not on file   Social History Narrative  . No narrative on file     ROS:  All 11 ROS were addressed and are negative except what is stated in the HPI  PHYSICAL EXAM Filed Vitals:   06/06/12 1515  BP: 119/71  Pulse: 106  Temp:   Resp: 12   General: Well developed, well nourished, in no acute distress Head:    Normal cephalic and atramatic  Lungs:  No wheezing Heart:   Tachycardic, irregularly irregular rhythm Abdomen: abdomen soft and non-tender  Msk:  Back normal, normal gait. Normal strength and tone for age. Extremities:   No  edema.   Neuro: Alert and oriented X 3. Psych:  Normal affect, responds appropriately   Labs:   Lab Results  Component Value Date   WBC 3.7* 06/06/2012   HGB 13.2 06/06/2012   HCT 37.1 06/06/2012   MCV 95.9 06/06/2012   PLT 162 06/06/2012    Recent Labs Lab 06/06/12 1322  NA 137  K 3.9  CL 102  CO2 23  BUN  13  CREATININE 0.97  CALCIUM 8.6  PROT 6.7  BILITOT 0.4  ALKPHOS 58  ALT 21  AST 43*  GLUCOSE 119*   Lab Results  Component Value Date   CKTOTAL 79 09/15/2011   CKMB 2.2 09/15/2011   TROPONINI <0.30 06/06/2012   No results found for this basename: PTT   Lab Results  Component Value Date   INR 2.92* 06/06/2012   INR 2.65* 03/17/2012   INR 2.04* 03/06/2012     Lab Results  Component Value Date   CHOL 225* 12/15/2011   CHOL 209* 02/18/2011   CHOL 242* 02/17/2011   Lab Results  Component Value Date   HDL 87.20 12/15/2011   HDL 782 02/18/2011   HDL 114 02/17/2011   Lab Results  Component Value Date   LDLCALC 94 02/18/2011   LDLCALC 118* 02/17/2011   LDLCALC 80 12/30/2010   Lab Results  Component Value Date   TRIG 100.0 12/15/2011   TRIG 56 02/18/2011   TRIG 49 02/17/2011   Lab Results  Component Value Date   CHOLHDL 3 12/15/2011   CHOLHDL 2.0 02/18/2011   CHOLHDL 2.1 02/17/2011   Lab Results  Component Value Date   LDLDIRECT 126.3 12/15/2011      Radiology: Chest x-ray shows no active disease  EKG:  Pending  ASSESSMENT: Atrial fibrillation with rapid ventricular response  PLAN:  Of note, her carvedilol dose was decreased several weeks ago.  She says she felt fine on the higher dose of carvedilol.  Will increase to 6.25 mg twice a day for additional rate control.  She is on Coumadin for stroke prevention.  Her INR is 2.9 today.  We'll hold Coumadin dose tonight in anticipation of possible AICD battery change out.  I am hesitant to start an IV diltiazem drip due to potential negative inotropic effects.  She has responded to diltiazem in the ER and by EMS.  Of note, she thinks her INR dropped to below 2 in the last few weeks.  I am not sure if this would change the scheduling of her generator change out.    Cardiomyopathy: Continue ACE inhibitor.  We'll increase beta blocker given fast heart rate.  Will consult electrophysiology regarding possible battery change  out tomorrow.  No evidence of active heart failure symptoms at this time.      Coronary artery disease: I suspect the pressure she had in her chest was related to the fast heart rate.  Would not add any further anticoagulation as she is already therapeutic on her Coumadin.  Would not pursue any ischemia workup.  Corky Crafts., MD  06/06/2012  5:02 PM

## 2012-06-06 NOTE — ED Notes (Signed)
Pt denies any chest pain at present, just feels what she describes as "fluttering" in her chest.

## 2012-06-07 ENCOUNTER — Encounter (HOSPITAL_COMMUNITY)
Admission: EM | Disposition: A | Discharge status: Home / Self Care | Payer: Self-pay | Source: Home / Self Care | Attending: Emergency Medicine

## 2012-06-07 DIAGNOSIS — I2589 Other forms of chronic ischemic heart disease: Secondary | ICD-10-CM

## 2012-06-07 DIAGNOSIS — I4891 Unspecified atrial fibrillation: Secondary | ICD-10-CM

## 2012-06-07 HISTORY — PX: IMPLANTABLE CARDIOVERTER DEFIBRILLATOR REVISION: SHX5470

## 2012-06-07 LAB — LIPID PANEL
HDL: 88 mg/dL (ref >39)
LDL Cholesterol: 119 mg/dL — ABNORMAL HIGH (ref 0–99)
Total CHOL/HDL Ratio: 2.5 RATIO

## 2012-06-07 LAB — BASIC METABOLIC PANEL
BUN: 15 mg/dL (ref 6–23)
GFR calc Af Amer: 56 mL/min — ABNORMAL LOW (ref >90)
GFR calc non Af Amer: 48 mL/min — ABNORMAL LOW (ref >90)
Potassium: 3.8 mEq/L (ref 3.5–5.1)
Sodium: 140 mEq/L (ref 135–145)

## 2012-06-07 LAB — PROTIME-INR: Prothrombin Time: 26.2 seconds — ABNORMAL HIGH (ref 11.6–15.2)

## 2012-06-07 LAB — HEMOGLOBIN A1C: Mean Plasma Glucose: 117 mg/dL — ABNORMAL HIGH (ref <117)

## 2012-06-07 LAB — CBC
Hemoglobin: 12.5 g/dL (ref 12.0–15.0)
MCH: 34.2 pg — ABNORMAL HIGH (ref 26.0–34.0)
MCHC: 34.5 g/dL (ref 30.0–36.0)

## 2012-06-07 LAB — TROPONIN I: Troponin I: <0.3 ng/mL (ref <0.30)

## 2012-06-07 SURGERY — IMPLANTABLE CARDIOVERTER DEFIBRILLATOR REVISION
Anesthesia: LOCAL

## 2012-06-07 MED ORDER — SODIUM CHLORIDE 0.9 % IJ SOLN: 3.0000 mL | Freq: Two times a day (BID) | INTRAMUSCULAR | Status: DC

## 2012-06-07 MED ORDER — SODIUM CHLORIDE 0.9 % IV SOLN: 250.0000 mL | INTRAVENOUS | Status: DC | PRN

## 2012-06-07 MED ORDER — WARFARIN SODIUM 4 MG PO TABS
4.5000 mg | ORAL_TABLET | ORAL | Status: DC
  Filled 2012-06-07: qty 1

## 2012-06-07 MED ORDER — VANCOMYCIN HCL IN DEXTROSE 1-5 GM/200ML-% IV SOLN
1000.0000 mg | Freq: Two times a day (BID) | INTRAVENOUS | Status: AC
  Administered 2012-06-08: 1000 mg via INTRAVENOUS
  Filled 2012-06-07: qty 200

## 2012-06-07 MED ORDER — SODIUM CHLORIDE 0.9 % IR SOLN
80.0000 mg | Status: DC
  Filled 2012-06-07: qty 2

## 2012-06-07 MED ORDER — OXYCODONE-ACETAMINOPHEN 5-325 MG PO TABS: 1.0000 | ORAL_TABLET | ORAL | Status: DC | PRN

## 2012-06-07 MED ORDER — ONDANSETRON HCL 4 MG/2ML IJ SOLN: 4.0000 mg | Freq: Four times a day (QID) | INTRAMUSCULAR | Status: DC | PRN

## 2012-06-07 MED ORDER — MIDAZOLAM HCL 5 MG/5ML IJ SOLN
INTRAMUSCULAR | Status: AC
  Filled 2012-06-07: qty 5

## 2012-06-07 MED ORDER — WARFARIN SODIUM 3 MG PO TABS: 3.0000 mg | ORAL_TABLET | ORAL | Status: DC

## 2012-06-07 MED ORDER — SODIUM CHLORIDE 0.9 % IV SOLN: 250.0000 mL | INTRAVENOUS | Status: DC

## 2012-06-07 MED ORDER — CHLORHEXIDINE GLUCONATE 4 % EX LIQD
60.0000 mL | Freq: Once | CUTANEOUS | Status: AC
  Administered 2012-06-07: 4 via TOPICAL

## 2012-06-07 MED ORDER — FENTANYL CITRATE 0.05 MG/ML IJ SOLN
INTRAMUSCULAR | Status: AC
  Filled 2012-06-07: qty 2

## 2012-06-07 MED ORDER — COUMADIN BOOK
Freq: Once | Status: AC
  Administered 2012-06-07: 21:00:00
  Filled 2012-06-07: qty 1

## 2012-06-07 MED ORDER — HYDROCODONE-ACETAMINOPHEN 5-325 MG PO TABS
1.0000 | ORAL_TABLET | ORAL | Status: DC | PRN
  Administered 2012-06-08: 2 via ORAL
  Filled 2012-06-07: qty 2

## 2012-06-07 MED ORDER — WARFARIN - PHARMACIST DOSING INPATIENT: Freq: Every day | Status: DC

## 2012-06-07 MED ORDER — VANCOMYCIN HCL IN DEXTROSE 1-5 GM/200ML-% IV SOLN
1000.0000 mg | INTRAVENOUS | Status: DC
  Filled 2012-06-07: qty 200

## 2012-06-07 MED ORDER — WARFARIN SODIUM 5 MG PO TABS
5.0000 mg | ORAL_TABLET | Freq: Once | ORAL | Status: DC
  Filled 2012-06-07: qty 1

## 2012-06-07 MED ORDER — SODIUM CHLORIDE 0.45 % IV SOLN
INTRAVENOUS | Status: DC
  Administered 2012-06-07: 15:00:00 via INTRAVENOUS

## 2012-06-07 MED ORDER — SODIUM CHLORIDE 0.9 % IJ SOLN: 3.0000 mL | INTRAMUSCULAR | Status: DC | PRN

## 2012-06-07 MED ORDER — WARFARIN SODIUM 3 MG PO TABS: 3.0000 mg | ORAL_TABLET | Freq: Every day | ORAL | Status: DC

## 2012-06-07 MED ORDER — LIDOCAINE HCL (PF) 1 % IJ SOLN
INTRAMUSCULAR | Status: AC
  Filled 2012-06-07: qty 30

## 2012-06-07 MED ORDER — ACETAMINOPHEN 325 MG PO TABS
325.0000 mg | ORAL_TABLET | ORAL | Status: DC | PRN
  Administered 2012-06-07: 650 mg via ORAL
  Filled 2012-06-07: qty 2

## 2012-06-07 MED ORDER — WARFARIN VIDEO: Freq: Once | Status: DC

## 2012-06-07 MED ORDER — CHLORHEXIDINE GLUCONATE 4 % EX LIQD
60.0000 mL | Freq: Once | CUTANEOUS | Status: AC
  Administered 2012-06-07: 4 via TOPICAL
  Filled 2012-06-07: qty 30

## 2012-06-07 NOTE — Progress Notes (Deleted)
Pharmacy note: VTE prophylaxis measures  -INR= 2.55 -Patient on coumadin PTA  No VTE prophylaxis needed due to therapeutic INR  [name], Pharm D 06/07/2012 11:50 AM

## 2012-06-07 NOTE — Progress Notes (Signed)
Pharmacy note: VTE prophylaxis measures  -INR= 2.55 -Patient on coumadin PTA  No VTE prophylaxis needed due to therapeutic INR  Harland German, Pharm D 06/07/2012 1:48 PM

## 2012-06-07 NOTE — Op Note (Signed)
SURGEON:  Hillis Range, MD      PREPROCEDURE DIAGNOSES:   1. Ischemic cardiomyopathy.   2. New York Heart Association class III, heart failure chronically.   3. Paroxysmal atrial fibrillation  4. Tachycardia bradycardia syndrome     POSTPROCEDURE DIAGNOSES:   1. Ischemic cardiomyopathy.   2. New York Heart Association class III, heart failure chronically.   3. Paroxysmal atrial fibrillation  4. Tachycardia bradycardia syndrome     PROCEDURES:    1. ICD pulse generator replacement with RA lead placement  2. Left upper extremity venography  3. Defibrillation threshold testing  4. ICD pocket revision     INTRODUCTION:  [name] is a 62 y.o. female with an ischemic CM (EF 25%), NYHA Class III CHF, and CAD. She has paroxysmal atrial fibrillation with tachy/brady syndrome.  Medical therapy for afib with RVR and CHF has been limited by symptomatic bradycardia.  She has reached ERI battery status.  The patient therefore  presents today for ICD pulse generator replacement with lead revision.      DESCRIPTION OF PROCEDURE:  Informed written consent was obtained and the patient was brought to the electrophysiology lab in the fasting state. The patient was adequately sedated with intravenous Versed, and fentanyl as outlined in the nursing report.  The patient's left chest was prepped and draped in the usual sterile fashion by the EP lab staff.  The skin overlying the left deltopectoral region was infiltrated with lidocaine for local analgesia.  A 5-cm incision was made over the existing ICD pocket.  A left subcutaneous defibrillator pocket was fashioned using a combination of sharp and blunt dissection.  Electrocautery was used to assure hemostasis.  The device was exposed and removed from the pocket.  The device was disconnected from the lead.  The RV lead was examined thoroughly and its integrity confirmed to be intact.    Left Upper extremity Venography:  A venogram of the left upper extremity  was performed which revealed that the left axillary vein was occluded distally.  The left subclavian vein was occluded distally but patent proximally.  RA  Lead Placement: The proximal subclavian vein was cannulated with fluoroscopic visualization.  Through the left sublcavian vein, a Public house manager model [id number]  (serial # [id number]) right atrial lead was advanced with fluoroscopic visualization into the right atrial appendage.  Initial atrial lead P-waves measured 8.9 mV with an impedance of 623 ohms and a threshold of 0.6 volts at 0.5 milliseconds.   The lead was secured to the pectoralis  fascia using #2 silk suture over the suture sleeve.  The pocket then  irrigated with copious gentamicin solution.  Both leads were then  connected to a [employer] ICD ([id number] [id number]).  The pocket was revised to accomidate the new device.  The defibrillator was placed into the  pocket.  The pocket was then closed in 2 layers with 2.0 Vicryl suture  for the subcutaneous and subcuticular layers.  Steri-Strips and a  sterile dressing were then applied.   DFT Testing: Defibrillation Threshold testing was then performed. Ventricular fibrillation was induced with a T shock.  Adequate sensing of ventricular  fibrillation was observed with minimal dropout with a programmed sensitivity of 1mV. VF organized into ventricular tachycardia. The patient was successfully defibrillated to sinus rhythm with a single 14 joules shock delivered from the device with an impedance of 42 ohms in a duration of 7 seconds.  The patient remained in sinus rhythm thereafter.  There were no early apparent complications.      CONCLUSIONS:   1. Ischemic cardiomyopathy with chronic New York Heart Association class III heart failure.   2. ICD at [facility] battery status  3. Successful ICD upgrade to a dual chamber device for symptomatic bradycardia  4. DFT less than or equal to 14 joules.   5. No early apparent complications.

## 2012-06-07 NOTE — Progress Notes (Signed)
Patient Name: [name] Date of Encounter: 06/07/2012    SUBJECTIVE:  The patient feels much better now. She says her heart went back into rhythm around 4 AM. When she is in atrial fibrillation there is chest tightness, a sense of jitteriness in her chest, and dyspnea. Upon arrival last evening she was then atrial fib with rapid ventricular response. She has already been seen by the electrophysiology service is morning who planned to further manage the patient by changing out her AICD and implanting additional leads. In the past the patient has had atrial fibrillation trigger of AICD discharge. This did not occur on this occasion. She is currently n.p.o. awaiting today's procedure.  TELEMETRY:  Sinus bradycardia at 51 beats per minute: Filed Vitals:   06/07/12 0610 06/07/12 0700 06/07/12 0800 06/07/12 0811  BP: 86/18 95/50 112/63   Pulse:      Temp:    97.6 F (36.4 C)  TempSrc:    Oral  Resp: 15 16 16    Height:      Weight:      SpO2: 100% 100% 100%     Intake/Output Summary (Last 24 hours) at 06/07/12 1140 Last data filed at 06/07/12 0100  Gross per 24 hour  Intake    250 ml  Output    500 ml  Net   -250 ml    LABS: Basic Metabolic Panel:  Recent Labs  47/82/95 1322 06/07/12 0457  NA 137 140  K 3.9 3.8  CL 102 104  CO2 23 26  GLUCOSE 119* 96  BUN 13 15  CREATININE 0.97 1.19*  CALCIUM 8.6 8.4   CBC:  Recent Labs  06/06/12 1322 06/07/12 0457  WBC 3.7* 3.7*  NEUTROABS 1.6*  --   HGB 13.2 12.5  HCT 37.1 36.2  MCV 95.9 98.9  PLT 162 142*   Cardiac Enzymes:  Recent Labs  06/06/12 1322 06/06/12 1537 06/07/12 0457  TROPONINI <0.30 <0.30 <0.30  Fasting Lipid Panel:  Recent Labs  06/07/12 0457  CHOL 219*  HDL 88  LDLCALC 119*  TRIG 62  CHOLHDL 2.5    Radiology/Studies:  Cardiomegaly but no acute disease was noted on the admitting chest x-ray of 06/06/2012  Physical Exam: Blood pressure 112/63, pulse 62, temperature 97.6 F (36.4 C),  temperature source Oral, resp. rate 16, height 5\' 6"  (1.676 m), weight 64.864 kg (143 lb), SpO2 100.00%. Weight change:    S4 gallop is heard on cardiac auscultation. Soft systolic murmur is also audible at the apex.  Lungs are clear auscultation and percussion.  Extremities reveal no edema.  ASSESSMENT:  1. Paroxysmal atrial fibrillation, resolved  2. Ischemic cardiomyopathy, with stable chronic systolic heart failure  3. AICD, end-of-life  4. Chronic anticoagulation therapy  Plan:  1. Continue n.p.o.  2. Continue amiodarone for suppression of atrial fibrillation  3. Minimize sedentary status as the patient may lose her progress if she is made with her spinal stenosis and decreased mobility  Signed, Lesleigh Noe 06/07/2012, 11:40 AM

## 2012-06-07 NOTE — Consult Note (Signed)
ELECTROPHYSIOLOGY CONSULT NOTE  Patient ID: [name] MRN: [id number], DOB/AGE: Feb 06, 1951   Admit date: 06/06/2012 Date of Consult: 06/07/2012  Primary Physician: Sanda Linger, MD Primary Cardiologist: Verdis Prime, MD Primary EP: Berton Mount, MD Reason for Consultation: Atrial fibrillation and ICD battery at ERI  History of Present Illness [name] is a 62 year old woman with an ischemic CM s/p ICD implant (recently found to be at ERI), PAF, CAD, CKD, HTN and prior CVA who has been admitted with palpitations. She was actually scheduled to pick up a heart monitor today as ordered by [name] for evaluation of bradycardia prior to ICD generator change to determine need for upgrade to dual chamber ICD. Yesterday morning she felt palpitations which she describes as fast and irregular, accompanied by chest pressure and SOB. Her home health aide checked her BP and pulse which were both elevated. [name] reports her BP and pulse normally are 90/60 and 50-60 respectively. Yesterday her BP was 160/90 and her pulse was anywhere from 118-123 bpm. Her home health aide instructed her to go to the ED. She denies SOB, dizziness, near syncope or syncope. She denies LE swelling, orthopnea or PND. She denies ICD shocks. Her carvedilol dose was decreased recently by [name] due to bradycardia otherwise she denies any changes to her medications. She has now converted to SR and reports feeling much better.    Past Medical History Past Medical History  Diagnosis Date  . Spinal stenosis   . Numbness of foot     left foot  . Diverticulitis     Status post partial colectomy 1/12 with reversal in July 2012  . Colitis, ischemic   . DJD (degenerative joint disease)     History of multiple surgeries to the back, shoulder and knee  . Ischemic cardiomyopathy     Echo 06/16/10: EF 25-30%, anteroseptal and apical hypokinesis, moderate AI, mild MR  . Chronic systolic heart failure   . CAD (coronary artery  disease)     a.  Ant MI 8/03 with stenting of the LAD;  b. staged PCI with Cypher DES to OM1 8/03;  c. s/p Cypher DES to LAD 7/04;  d.  multiple cardiac caths in past (chronically abnl ECG);    e. cardiac catheterization 3/12: LAD stent patent, distal LAD 40-50%, small ostial D1 80%, ostial D2 60%, OM-1 stent patent (20-30%), proximal RCA 50%, proximal to mid RCA 40-50%; f. cath 02/17/2011 - nonobs  . LV (left ventricular) mural thrombus     Chronic Coumadin therapy  . CKD (chronic kidney disease), stage III     creatinine:  1.3 in 4/12;    . Tobacco abuse     history  . GERD (gastroesophageal reflux disease)   . Hypertension   . Lower GI bleed     August 2011  . Hypothyroidism   . Aortic insufficiency   . Pseudoaneurysm     History of, right forearm  . Hyperlipidemia   . OA (osteoarthritis)   . Abnormal EKG     Chronically abnormal EKG.   . Spinal stenosis   . Numbness of foot     Left foot  . Compression fracture 07/04/11    L2  . Anterior myocardial infarction 10/2001  . SVT (supraventricular tachycardia)     ? h/o AFib;  patient on long term amiodarone therapy  . PAF (paroxysmal atrial fibrillation)   . Anatomical narrow angle of right eye   . Heart murmur     "  age [age]"  . Shortness of breath     "at times; related to CHF"  . Shortness of breath on exertion   . ICD (implantable cardiac defibrillator) in place 07/2003    followed by [name].   [name] AICD (automatic cardioverter/defibrillator) present 10/2004    explant; implant  . Blood transfusion   . Anemia   . H/O hiatal hernia   . Stroke     History of TIA and possible CVA; hospitalized 2004; on coumadin  . Systolic heart failure   . Sinus bradycardia 03/05/2012    Past Surgical History Past Surgical History  Procedure Laterality Date  . Back surgery    . Coronary angioplasty with stent placement  10/2001; 09/2002  . Cardiac defibrillator placement  07/2003; 10/2004  . Lumbar laminectomy/decompression  microdiscectomy  10/2001    L5-S1/E-chart  . Knee arthroscopy      left  . Thyroidectomy  1970's  . Total knee arthroplasty  11/2003    left  . X-stop implantation  12/2004    L3-4; L4-5  . Fixation kyphoplasty thoracic spine  07/2007    T3, 4, 6 compression fractures  . Shoulder open rotator cuff repair  04/2008    right  . Lumbar laminectomy/decompression microdiscectomy  01/2010  . Colectomy  03/2010    sigmoid left  . Colostomy  03/2010    transverse  . Peripherally inserted central catheter insertion  03/2010    removed upon discharge  . Colostomy closure  12/2010    reversal  . Tonsillectomy and adenoidectomy  1969  . Appendectomy  03/2010  . Cataract extraction      right     Allergies/Intolerances Allergies  Allergen Reactions  . Lansoprazole Nausea Only  . Penicillins Swelling    Throat swells  . Statins Other (See Comments)    cramps  . Lactose Intolerance (Gi) Diarrhea and Other (See Comments)    Patient reports abdominal cramping and diarrhea with lactose products.     Inpatient Medications . amiodarone  200 mg Oral Daily  . aspirin  324 mg Oral NOW   Or  . aspirin  300 mg Rectal NOW  . aspirin EC  81 mg Oral Daily  . calcium-vitamin D  1 tablet Oral BID WC  . carvedilol  6.25 mg Oral BID WC  . ezetimibe  10 mg Oral QHS  . folic acid  2 mg Oral Daily  . furosemide  20 mg Oral Daily  . levothyroxine  200 mcg Oral QAC breakfast  . lisinopril  2.5 mg Oral BID  . loratadine  10 mg Oral Daily  . metoprolol  5 mg Intravenous Once  . omega-3 acid ethyl esters  1 g Oral Daily  . pantoprazole  40 mg Oral BID  . potassium chloride SA  20 mEq Oral Daily  . pregabalin  50 mg Oral TID  . vitamin B-6  25 mg Oral Daily  . raloxifene  60 mg Oral Daily  . spironolactone  25 mg Oral Daily  . vitamin B-12  2,000 mcg Oral Daily    Family History Family History  Problem Relation Age of Onset  . Diabetes Mother   . Diabetes Brother   . Arthritis      family  history  . Prostate cancer      Family History     Social History Social History  . Marital Status: Widowed   Social History Main Topics  . Smoking status: Former Smoker -- 0.10  packs/day for 33 years    Types: Cigarettes    Quit date: 04/21/2001  . Smokeless tobacco: Never Used     Comment: "smoked ~ 1 pack/month"  . Alcohol Use: No     Comment: 07/05/11 "occasionally; last time was glass of wine last week"  . Drug Use: No   Review of Systems General: No chills, fever, night sweats or weight changes  Cardiovascular:  No chest pain, dyspnea on exertion, edema, orthopnea, palpitations, paroxysmal nocturnal dyspnea Dermatological: No rash, lesions or masses Respiratory: No cough, dyspnea Urologic: No hematuria, dysuria Abdominal: No nausea, vomiting, diarrhea, bright red blood per rectum, melena, or hematemesis Neurologic: No visual changes, weakness, changes in mental status All other systems reviewed and are otherwise negative except as noted above.  Physical Exam Blood pressure 112/63, pulse 62, temperature 97.6 F (36.4 C), temperature source Oral, resp. rate 16, height 5\' 6"  (1.676 m), weight 143 lb (64.864 kg), SpO2 100.00%.  General: Well developed, well appearing 62 year old female in no acute distress. HEENT: Normocephalic, atraumatic. EOMs intact. Sclera nonicteric. Oropharynx clear.  Neck: Supple without bruits. No JVD. Lungs: Respirations regular and unlabored, CTA bilaterally. No wheezes, rales or rhonchi. Heart: RRR. S1, S2 present. No murmurs, rub, S3 or S4. Abdomen: Soft, non-tender, non-distended. BS present x 4 quadrants. No hepatosplenomegaly.  Extremities: No clubbing, cyanosis or edema. DP/PT/Radials 2+ and equal bilaterally. Psych: Normal affect. Neuro: Alert and oriented X 3. Moves all extremities spontaneously. Musculoskeletal: No kyphosis. Skin: Intact. Warm and dry. No rashes or petechiae in exposed areas.   Labs  Recent Labs  06/06/12 1322  06/06/12 1537 06/07/12 0457  TROPONINI <0.30 <0.30 <0.30   Lab Results  Component Value Date   WBC 3.7* 06/07/2012   HGB 12.5 06/07/2012   HCT 36.2 06/07/2012   MCV 98.9 06/07/2012   PLT 142* 06/07/2012    Recent Labs Lab 06/06/12 1322 06/07/12 0457  NA 137 140  K 3.9 3.8  CL 102 104  CO2 23 26  BUN 13 15  CREATININE 0.97 1.19*  CALCIUM 8.6 8.4  PROT 6.7  --   BILITOT 0.4  --   ALKPHOS 58  --   ALT 21  --   AST 43*  --   GLUCOSE 119* 96    Recent Labs  06/07/12 0457  INR 2.55*    Radiology/Studies Dg Chest Port 1 View  06/06/2012  *RADIOLOGY REPORT*  Clinical Data: Atrial fibrillation, chest pain  PORTABLE CHEST - 1 VIEW  Comparison: 12/28/ 13  Findings: Cardiomegaly again noted.  Single lead cardiac pacemaker is unchanged in position.  No acute infiltrate or pleural effusion. No pulmonary edema.  Mild elevation of the right hemidiaphragm again noted.  Mild hyperinflation. Prior vertebroplasty upper thoracic spine.  IMPRESSION: No active disease.  No significant change.  Cardiomegaly again noted.  Hyperinflation.   Original Report Authenticated By: Natasha Mead, M.D.     12-lead ECG on presentation yesterday shows atrial fibrillation at 107 bpm Repeat 12-lead ECG this AM shows sinus bradycardia with normal intervals Telemetry shows normal sinus rhythm with intermittent sinus bradycardia   Assessment and Plan 1. Atrial fibrillation  Known h/o PAF Now back in SR Continue amiodarone and BB Continue warfarin for embolic prophylaxis 2. Sinus bradycardia  Asymptomatic No evidence of significant bradycardia, AV block or pause 3. ICD battery at [facility]  Will schedule ICD change out while here 4. Ischemic CM with chronic systolic HF  Euvolemic on exam Continue medical therapy Has  narrow QRS, does not meet criteria for CRT 5. CAD  Per Dr. Eldridge Dace  Dr. Johney Frame to see Signed, Rick Duff, PA-C 06/07/2012, 9:57 AM   I have seen, examined the patient, and reviewed  the above assessment and plan.  Changes to above are made where necessary.  The patient has tachy/brady syndrome and will likely require further uptitration of her beta blocker going forward.  I therefore would recommend ICD upgrade to a dual chamber system at this time.  She has reached ERI. The patient  understands that the risks include but are not limited to bleeding, infection, pneumothorax, perforation, tamponade, vascular damage, renal failure, MI, stroke, death, inappropriate shocks, and lead dislodgement and wishes to proceed.  We will therefore proceed today.    Co Sign: Hillis Range, MD 06/07/2012 1:27 PM

## 2012-06-07 NOTE — Progress Notes (Signed)
ANTICOAGULATION CONSULT NOTE - Initial Consult  Pharmacy Consult for Coumadin Indication: atrial fibrillation  Allergies  Allergen Reactions  . Lansoprazole Nausea Only  . Penicillins Swelling    Throat swells  . Statins Other (See Comments)    cramps  . Lactose Intolerance (Gi) Diarrhea and Other (See Comments)    Patient reports abdominal cramping and diarrhea with lactose products.     Patient Measurements: Height: 5\' 6"  (167.6 cm) Weight: 143 lb (64.864 kg) IBW/kg (Calculated) : 59.3 Heparin Dosing Weight:   Vital Signs: Temp: 98.1 F (36.7 C) (03/20 1158) Temp src: Oral (03/20 1158) BP: 107/47 mmHg (03/20 1300)  Labs:  Recent Labs  06/06/12 1322 06/06/12 1537 06/07/12 0457  HGB 13.2  --  12.5  HCT 37.1  --  36.2  PLT 162  --  142*  LABPROT 29.0*  --  26.2*  INR 2.92*  --  2.55*  CREATININE 0.97  --  1.19*  TROPONINI <0.30 <0.30 <0.30    Estimated Creatinine Clearance: 45.9 ml/min (by C-G formula based on Cr of 1.19).   Medical History: Past Medical History  Diagnosis Date  . Spinal stenosis   . Numbness of foot     left foot  . Diverticulitis     Status post partial colectomy 1/12 with reversal in July 2012  . Colitis, ischemic   . DJD (degenerative joint disease)     History of multiple surgeries to the back, shoulder and knee  . Ischemic cardiomyopathy     Echo 06/16/10: EF 25-30%, anteroseptal and apical hypokinesis, moderate AI, mild MR  . Chronic systolic heart failure   . CAD (coronary artery disease)     a.  Ant MI 8/03 with stenting of the LAD;  b. staged PCI with Cypher DES to OM1 8/03;  c. s/p Cypher DES to LAD 7/04;  d.  multiple cardiac caths in past (chronically abnl ECG);    e. cardiac catheterization 3/12: LAD stent patent, distal LAD 40-50%, small ostial D1 80%, ostial D2 60%, OM-1 stent patent (20-30%), proximal RCA 50%, proximal to mid RCA 40-50%; f. cath 02/17/2011 - nonobs  . LV (left ventricular) mural thrombus     Chronic  Coumadin therapy  . CKD (chronic kidney disease), stage III     creatinine:  1.3 in 4/12;    . Tobacco abuse     history  . GERD (gastroesophageal reflux disease)   . Hypertension   . Lower GI bleed     August 2011  . Hypothyroidism   . Aortic insufficiency   . Pseudoaneurysm     History of, right forearm  . Hyperlipidemia   . OA (osteoarthritis)   . Abnormal EKG     Chronically abnormal EKG.   . Spinal stenosis   . Numbness of foot     Left foot  . Compression fracture 07/04/11    L2  . Anterior myocardial infarction 10/2001  . SVT (supraventricular tachycardia)     ? h/o AFib;  patient on long term amiodarone therapy  . PAF (paroxysmal atrial fibrillation)   . Anatomical narrow angle of right eye   . Heart murmur     "age 54-19"  . Shortness of breath     "at times; related to CHF"  . Shortness of breath on exertion   . 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 Graciela Husbands.   [name] AICD (automatic cardioverter/defibrillator) present 10/2004    explant; implant  .  Blood transfusion   . Anemia   . H/O hiatal hernia   . Stroke     History of TIA and possible CVA; hospitalized 2004; on coumadin  . Systolic heart failure   . Sinus bradycardia 03/05/2012    Medications:  Scheduled:  . amiodarone  200 mg Oral Daily  . calcium-vitamin D  1 tablet Oral BID WC  . carvedilol  6.25 mg Oral BID WC  . [COMPLETED] chlorhexidine  60 mL Topical Once  . [COMPLETED] chlorhexidine  60 mL Topical Once  . ezetimibe  10 mg Oral QHS  . [COMPLETED] fentaNYL      . folic acid  2 mg Oral Daily  . furosemide  20 mg Oral Daily  . levothyroxine  200 mcg Oral QAC breakfast  . [COMPLETED] lidocaine (PF)      . lisinopril  2.5 mg Oral BID  . loratadine  10 mg Oral Daily  . metoprolol  5 mg Intravenous Once  . [COMPLETED] midazolam      . [COMPLETED] midazolam      . omega-3 acid ethyl esters  1 g Oral Daily  . pantoprazole  40 mg Oral BID  . potassium chloride SA   20 mEq Oral Daily  . pregabalin  50 mg Oral TID  . vitamin B-6  25 mg Oral Daily  . raloxifene  60 mg Oral Daily  . sodium chloride  3 mL Intravenous Q12H  . spironolactone  25 mg Oral Daily  . vancomycin  1,000 mg Intravenous Q12H  . vitamin B-12  2,000 mcg Oral Daily  . [DISCONTINUED] aspirin  324 mg Oral NOW  . [DISCONTINUED] aspirin EC  81 mg Oral Daily  . [DISCONTINUED] aspirin  300 mg Rectal NOW  . [DISCONTINUED] gentamicin irrigation  80 mg Irrigation On Call  . [DISCONTINUED] sodium chloride  3 mL Intravenous Q12H  . [DISCONTINUED] vancomycin  1,000 mg Intravenous On Call  . [DISCONTINUED] warfarin  3-4.5 mg Oral Daily    Assessment: 62yo female with AFib, s/p upgrade to dual chamber ICD- to resume Coumadin.  She was admitted with therapeutic INR of 2.92 on home dose of 3mg  daily, except for 4.5mg  on Wed, Fri.  Her last dose of Coumadin was on 3/18.    Goal of Therapy:  INR 2-3 Monitor platelets by anticoagulation protocol: Yes   Plan:  1.  Coumadin 5mg  po x 1 tonight 2.  Coumadin 3mg  daily, except 4.5mg  on Wed, Fri to start tomorrow 3.  Daily INR 4.  Coumadin book and video.  Marisue Humble, PharmD Clinical Pharmacist [facility]

## 2012-06-08 ENCOUNTER — Observation Stay (HOSPITAL_COMMUNITY): Payer: Medicare Other

## 2012-06-08 ENCOUNTER — Telehealth: Payer: Self-pay | Admitting: Internal Medicine

## 2012-06-08 LAB — PROTIME-INR: Prothrombin Time: 25.3 seconds — ABNORMAL HIGH (ref 11.6–15.2)

## 2012-06-08 LAB — BASIC METABOLIC PANEL
BUN: 18 mg/dL (ref 6–23)
Chloride: 98 mEq/L (ref 96–112)
GFR calc Af Amer: 47 mL/min — ABNORMAL LOW (ref >90)
GFR calc non Af Amer: 40 mL/min — ABNORMAL LOW (ref >90)
Potassium: 3.7 mEq/L (ref 3.5–5.1)
Sodium: 135 mEq/L (ref 135–145)

## 2012-06-08 MED ORDER — CARVEDILOL 6.25 MG PO TABS: 6.2500 mg | ORAL_TABLET | Freq: Two times a day (BID) | ORAL | Status: DC

## 2012-06-08 MED ORDER — YOU HAVE A PACEMAKER BOOK
Freq: Once | Status: AC
  Administered 2012-06-08: 1
  Filled 2012-06-08: qty 1

## 2012-06-08 NOTE — Telephone Encounter (Signed)
New problem    [name] stated pt is leaving hospital today and they need resumption of care orders for pt to remains under [facility]'s care. Pt had upgrade to dual chamber device. You can fax them to [phone] or call in to [facility]

## 2012-06-08 NOTE — Progress Notes (Signed)
Patient: [name] Date of Encounter: 06/08/2012, 7:29 AM Admit date: 06/06/2012     Subjective  Ms. Schurman reports she is feeling great, eager to go home. She denies CP or SOB.   Objective  Physical Exam: Vitals: BP 100/39  Pulse 59  Temp(Src) 97.5 F (36.4 C) (Oral)  Resp 17  Ht 5\' 6"  (1.676 m)  Wt 146 lb 6.2 oz (66.4 kg)  BMI 23.64 kg/m2  SpO2 98% General: Well developed, well appearing 62 year old female in no acute distress. Neck: Supple. JVD not elevated. Lungs: Clear bilaterally to auscultation without wheezes, rales, or rhonchi. Breathing is unlabored. Heart: RRR S1 S2 without murmurs, rubs, or gallops.  Abdomen: Soft, non-distended. Extremities: No clubbing or cyanosis. No edema.  Distal pedal pulses are 2+ and equal bilaterally. Neuro: Alert and oriented X 3. Moves all extremities spontaneously. No focal deficits. Skin: Left upper chest/implant site intact without bleeding or hematoma.  Intake/Output:  Intake/Output Summary (Last 24 hours) at 06/08/12 0729 Last data filed at 06/08/12 0300  Gross per 24 hour  Intake 3550.83 ml  Output      0 ml  Net 3550.83 ml    Inpatient Medications:  . amiodarone  200 mg Oral Daily  . calcium-vitamin D  1 tablet Oral BID WC  . carvedilol  6.25 mg Oral BID WC  . ezetimibe  10 mg Oral QHS  . folic acid  2 mg Oral Daily  . furosemide  20 mg Oral Daily  . levothyroxine  200 mcg Oral QAC breakfast  . lisinopril  2.5 mg Oral BID  . loratadine  10 mg Oral Daily  . metoprolol  5 mg Intravenous Once  . omega-3 acid ethyl esters  1 g Oral Daily  . pantoprazole  40 mg Oral BID  . potassium chloride SA  20 mEq Oral Daily  . pregabalin  50 mg Oral TID  . vitamin B-6  25 mg Oral Daily  . raloxifene  60 mg Oral Daily  . sodium chloride  3 mL Intravenous Q12H  . spironolactone  25 mg Oral Daily  . vitamin B-12  2,000 mcg Oral Daily  . [START ON 06/09/2012] warfarin  3 mg Oral Custom  . warfarin  4.5 mg Oral Custom  .  warfarin  5 mg Oral ONCE-1800  . warfarin   Does not apply Once  . Warfarin - Pharmacist Dosing Inpatient   Does not apply q1800    Labs:  Recent Labs  06/07/12 0457 06/08/12 0610  NA 140 135  K 3.8 3.7  CL 104 98  CO2 26 26  GLUCOSE 96 122*  BUN 15 18  CREATININE 1.19* 1.37*  CALCIUM 8.4 8.5    Recent Labs  06/06/12 1322  AST 43*  ALT 21  ALKPHOS 58  BILITOT 0.4  PROT 6.7  ALBUMIN 3.4*    Recent Labs  06/06/12 1322 06/07/12 0457  WBC 3.7* 3.7*  NEUTROABS 1.6*  --   HGB 13.2 12.5  HCT 37.1 36.2  MCV 95.9 98.9  PLT 162 142*    Recent Labs  06/06/12 1322 06/06/12 1537 06/07/12 0457  TROPONINI <0.30 <0.30 <0.30    Recent Labs  06/06/12 1942  HGBA1C 5.7*    Recent Labs  06/07/12 0457  CHOL 219*  HDL 88  LDLCALC 119*  TRIG 62  CHOLHDL 2.5    Recent Labs  06/07/12 0457  TSH 0.192*    Recent Labs  06/08/12 0610  INR 2.43*    Radiology/Studies: CXR this AM pending  Telemetry: A paced V sensed  Device interrogation: performed by industry - normal dual chamber ICD function    Assessment and Plan  1. Atrial fibrillation  Known h/o PAF  Now back in SR  Continue amiodarone and BB  Continue warfarin for embolic prophylaxis  2. Sinus bradycardia  Resolved, now s/p atrial lead placement 3. ICD battery at [facility] s/p ICD change out yesterday  Doing well post procedure If CXR shows stable lead placement without PTX, Ms. Slutsky is stable for DC from EP standpoint Wound care, activity restrictions and follow-up reviewed with Ms. Cremeans  4. Ischemic CM with chronic systolic HF  Euvolemic on exam  Continue medical therapy  Per Dr. Eldridge Dace 5. CAD  Per Dr. Eldridge Dace  Signed,  EDMISTEN, BROOKE PA-C   I have seen, examined the patient, and reviewed the above assessment and plan.  Changes to above are made where necessary.  Device interrogation is reviewed (see paper chart) and normal.  CXR reviewed.. No obvious ptx.  OK to discharge  from EP standpoint. Return for wound check in 10 days. Follow-up with me in 3 months  Co Sign: Hillis Range, MD 06/08/2012 8:05 AM

## 2012-06-08 NOTE — Telephone Encounter (Signed)
[name] out pt care called. Pt. Is being D/C from the hospital today and pt needs resumption of care orders for pt to remain under [facility] care. Pt was upgrade to dual chamber device. Nurse is aware that Dr. Graciela Husbands is not in the office today.

## 2012-06-08 NOTE — Discharge Summary (Signed)
Patient ID: [name] MRN: [id number] DOB/AGE: 09-29-1950 62 y.o.  Admit date: 06/06/2012 Discharge date: 06/08/2012  Primary Discharge Diagnosis: Atrial fibrillation with rapid ventricular response Secondary Discharge Diagnosis: AICD with power source end-of-life Hypertension Chronic systolic heart failure Coronary artery disease  Spinal stenosis with decreased mobility Chronic kidney disease, stage III Chronic Coumadin anticoagulation Osteoarthritis Hypothyroidism Prior CVA  Significant Diagnostic Studies: None  Consults: Electrophysiology with AICD power source replaced  Hospital Course:  The patient had sudden onset of atrial fibrillation with rapid ventricular response. She was admitted to the hospital because of the arrhythmia. She had spontaneous conversion. Coumadin anticoagulation was therapeutic.  The patient's AICD had been noted at end-of-life. EP consultation was obtained and recommended power source replacement and upgrade to a dual-chamber device with an atrial lead. This was performed by Dr. Hillis Range on 06/07/2012. No complications occurred.  On the morning of discharge the patient was deemed stable and the pacing/AICD device demonstrated no abnormalities.   Discharge Exam: Blood pressure 100/39, pulse 59, temperature 97.5 F (36.4 C), temperature source Oral, resp. rate 17, height 5\' 6"  (1.676 m), weight 66.4 kg (146 lb 6.2 oz), SpO2 98.00%.    Small hematoma in the device pocket  Lungs clear  Cardiac exam reveals a soft systolic murmur. No gallop  Monitor reveals sinus bradycardia alternating with AV sequential pacing. Labs:   Lab Results  Component Value Date   WBC 3.7* 06/07/2012   HGB 12.5 06/07/2012   HCT 36.2 06/07/2012   MCV 98.9 06/07/2012   PLT 142* 06/07/2012    Recent Labs Lab 06/06/12 1322  06/08/12 0610  NA 137  < > 135  K 3.9  < > 3.7  CL 102  < > 98  CO2 23  < > 26  BUN 13  < > 18  CREATININE 0.97  < > 1.37*  CALCIUM  8.6  < > 8.5  PROT 6.7  --   --   BILITOT 0.4  --   --   ALKPHOS 58  --   --   ALT 21  --   --   AST 43*  --   --   GLUCOSE 119*  < > 122*  < > = values in this interval not displayed. Lab Results  Component Value Date   CKTOTAL 79 09/15/2011   CKMB 2.2 09/15/2011   TROPONINI <0.30 06/07/2012    Lab Results  Component Value Date   CHOL 219* 06/07/2012   CHOL 225* 12/15/2011   CHOL 209* 02/18/2011   Lab Results  Component Value Date   HDL 88 06/07/2012   HDL 87.20 12/15/2011   HDL 147 02/18/2011   Lab Results  Component Value Date   LDLCALC 119* 06/07/2012   LDLCALC 94 02/18/2011   LDLCALC 118* 02/17/2011   Lab Results  Component Value Date   TRIG 62 06/07/2012   TRIG 100.0 12/15/2011   TRIG 56 02/18/2011   Lab Results  Component Value Date   CHOLHDL 2.5 06/07/2012   CHOLHDL 3 12/15/2011   CHOLHDL 2.0 02/18/2011   Lab Results  Component Value Date   LDLDIRECT 126.3 12/15/2011    INR/Prothrombin Time: 2.95 (06/06/12), 2.55 (06/07/12), 2.43 (06/08/12) Radiology: No acute abnormality and lead position is stable EKG:  AV sequential pacing  FOLLOW UP PLANS AND APPOINTMENTS  Future Appointments Provider Department Dept Phone   06/18/2012 11:00 AM [facility] [employer] Main Office [place]) [phone]   09/06/2012 3:15 PM  Duke Salvia, MD [facility] Main Office [place]) [phone]       Medication List    TAKE these medications       amiodarone 200 MG tablet  Commonly known as:  PACERONE  Take 200 mg by mouth daily.     aspirin EC 81 MG tablet  Take 81 mg by mouth daily.     calcium-vitamin D 500-200 MG-UNIT per tablet  Commonly known as:  OSCAL WITH D  Take 1 tablet by mouth 2 (two) times daily with a meal.     carvedilol 3.125 MG tablet  Commonly known as:  COREG  Take 3.125 mg by mouth 2 (two) times daily with a meal.     cyclobenzaprine 5 MG tablet  Commonly known as:  FLEXERIL  Take 5 mg by mouth 2 (two) times daily as needed for  muscle spasms.     ezetimibe 10 MG tablet  Commonly known as:  ZETIA  Take 10 mg by mouth at bedtime.     fexofenadine 180 MG tablet  Commonly known as:  ALLEGRA  Take 180 mg by mouth daily.     fluticasone 50 MCG/ACT nasal spray  Commonly known as:  FLONASE  Place 2 sprays into the nose daily as needed for allergies.     folic acid-pyridoxine-cyancobalamin 2.5-25-2 MG Tabs  Commonly known as:  FOLTX  Take 1 tablet by mouth daily.     furosemide 20 MG tablet  Commonly known as:  LASIX  Take 20 mg by mouth daily.     HYDROcodone-acetaminophen 7.5-325 MG per tablet  Commonly known as:  NORCO  Take 1 tablet by mouth every 8 (eight) hours as needed for pain.     levothyroxine 200 MCG tablet  Commonly known as:  SYNTHROID, LEVOTHROID  Take 200 mcg by mouth daily.     lisinopril 5 MG tablet  Commonly known as:  PRINIVIL,ZESTRIL  Take 2.5 mg by mouth 2 (two) times daily.     methocarbamol 500 MG tablet  Commonly known as:  ROBAXIN  Take 500 mg by mouth 4 (four) times daily as needed (for spasms).     nitroGLYCERIN 0.4 MG SL tablet  Commonly known as:  NITROSTAT  Place 0.4 mg under the tongue every 5 (five) minutes as needed for chest pain.     omega-3 acid ethyl esters 1 G capsule  Commonly known as:  LOVAZA  Take 1 g by mouth daily.     pantoprazole 40 MG tablet  Commonly known as:  PROTONIX  Take 40 mg by mouth 2 (two) times daily.     polyethylene glycol packet  Commonly known as:  MIRALAX / GLYCOLAX  Take 17 g by mouth daily as needed (for constipation).     potassium chloride SA 20 MEQ tablet  Commonly known as:  K-DUR,KLOR-CON  Take 20 mEq by mouth daily.     pregabalin 50 MG capsule  Commonly known as:  LYRICA  Take 50 mg by mouth 3 (three) times daily.     promethazine 25 MG tablet  Commonly known as:  PHENERGAN  Take 25 mg by mouth every 6 (six) hours as needed for nausea.     raloxifene 60 MG tablet  Commonly known as:  EVISTA  Take 60 mg by  mouth daily.     spironolactone 25 MG tablet  Commonly known as:  ALDACTONE  Take 25 mg by mouth daily.     warfarin 3 MG tablet  Commonly  known as:  COUMADIN  Take 3-4.5 mg by mouth daily. Takes 3mg  every day, except on Wednesday and Friday takes 4.5mg            Follow-up Information   Follow up with [facility] On 06/18/2012. (At 11:00 AM)    Contact information:   [address] [place] [address] [phone]      Follow up with Sherryl Manges, MD On 09/06/2012. (At 3:15 PM)    Contact information:   [address] [place] [address] [phone]      Follow up with Lesleigh Noe, MD On 07/25/2012. (3:15 PM)    Contact information:   [address] [place] [address] [phone]       BRING ALL MEDICATIONS WITH YOU TO FOLLOW UP APPOINTMENTS  Time spent with patient to include physician time: 20 minutes Signed: Lesleigh Noe 06/08/2012, 8:12 AM

## 2012-06-11 NOTE — Telephone Encounter (Signed)
I left a message for Faith, with [facility], to call.

## 2012-06-11 NOTE — Telephone Encounter (Signed)
Routing to RN

## 2012-06-11 NOTE — Telephone Encounter (Signed)
F/u   Patient returning nurse HM call, she can be reached at home regarding the order request

## 2012-06-11 NOTE — Telephone Encounter (Signed)
No form received.

## 2012-06-12 NOTE — Telephone Encounter (Signed)
I spoke with [name] today and gave a verbal order ok to resume home care services. She will be faxing a written order for Dr. Graciela Husbands to sign.

## 2012-06-18 ENCOUNTER — Ambulatory Visit (INDEPENDENT_AMBULATORY_CARE_PROVIDER_SITE_OTHER): Payer: Medicare Other | Admitting: *Deleted

## 2012-06-18 ENCOUNTER — Encounter: Payer: Self-pay | Admitting: Internal Medicine

## 2012-06-18 ENCOUNTER — Other Ambulatory Visit: Payer: Self-pay

## 2012-06-18 DIAGNOSIS — I498 Other specified cardiac arrhythmias: Secondary | ICD-10-CM

## 2012-06-18 DIAGNOSIS — I4891 Unspecified atrial fibrillation: Secondary | ICD-10-CM

## 2012-06-18 DIAGNOSIS — R001 Bradycardia, unspecified: Secondary | ICD-10-CM

## 2012-06-18 DIAGNOSIS — Z95810 Presence of automatic (implantable) cardiac defibrillator: Secondary | ICD-10-CM

## 2012-06-18 DIAGNOSIS — I480 Paroxysmal atrial fibrillation: Secondary | ICD-10-CM

## 2012-06-18 LAB — ICD DEVICE OBSERVATION
AL AMPLITUDE: 10.2 mv
ATRIAL PACING ICD: 69 pct
DEVICE MODEL ICD: 29369458
HV IMPEDENCE: 57 Ohm
RV LEAD AMPLITUDE: 20.6 mv
VENTRICULAR PACING ICD: 1 pct

## 2012-06-18 NOTE — Progress Notes (Signed)
Dual chamber ICD wound check. Normal device function. Episodes of AF + Warfarin. No changes made. Site well healed with no redness or swelling. ROV 09-06-12 @ 1515 with SK.

## 2012-07-18 ENCOUNTER — Other Ambulatory Visit (INDEPENDENT_AMBULATORY_CARE_PROVIDER_SITE_OTHER): Payer: Medicare Other

## 2012-07-18 ENCOUNTER — Ambulatory Visit (INDEPENDENT_AMBULATORY_CARE_PROVIDER_SITE_OTHER): Payer: Medicare Other | Admitting: Internal Medicine

## 2012-07-18 ENCOUNTER — Encounter: Payer: Self-pay | Admitting: Internal Medicine

## 2012-07-18 ENCOUNTER — Other Ambulatory Visit (HOSPITAL_COMMUNITY)
Admission: RE | Admit: 2012-07-18 | Discharge: 2012-07-18 | Disposition: A | Discharge status: Home / Self Care | Payer: Medicare Other | Source: Ambulatory Visit | Attending: Internal Medicine | Admitting: Internal Medicine

## 2012-07-18 VITALS — BP 106/68 | HR 88 | Temp 98.0°F | Resp 16 | Wt 144.8 lb

## 2012-07-18 DIAGNOSIS — R300 Dysuria: Secondary | ICD-10-CM

## 2012-07-18 DIAGNOSIS — T464X5A Adverse effect of angiotensin-converting-enzyme inhibitors, initial encounter: Secondary | ICD-10-CM

## 2012-07-18 DIAGNOSIS — T44905A Adverse effect of unspecified drugs primarily affecting the autonomic nervous system, initial encounter: Secondary | ICD-10-CM

## 2012-07-18 DIAGNOSIS — N898 Other specified noninflammatory disorders of vagina: Secondary | ICD-10-CM

## 2012-07-18 DIAGNOSIS — Z01419 Encounter for gynecological examination (general) (routine) without abnormal findings: Secondary | ICD-10-CM | POA: Insufficient documentation

## 2012-07-18 DIAGNOSIS — N390 Urinary tract infection, site not specified: Secondary | ICD-10-CM

## 2012-07-18 DIAGNOSIS — R05 Cough: Secondary | ICD-10-CM

## 2012-07-18 LAB — URINALYSIS, ROUTINE W REFLEX MICROSCOPIC
Specific Gravity, Urine: >=1.03 (ref 1.000–1.030)
Total Protein, Urine: 30
Urine Glucose: NEGATIVE
Urobilinogen, UA: 1 (ref 0.0–1.0)

## 2012-07-18 LAB — HM PAP SMEAR: HM Pap smear: NORMAL

## 2012-07-18 MED ORDER — CIPROFLOXACIN HCL 250 MG PO TABS: 250.0000 mg | ORAL_TABLET | Freq: Two times a day (BID) | ORAL | Status: AC

## 2012-07-18 NOTE — Patient Instructions (Signed)

## 2012-07-18 NOTE — Progress Notes (Signed)
Subjective:    Patient ID: [name], female    DOB: [birth year], 62 y.o.   MRN: [id number]  Cough This is a chronic problem. The current episode started more than 1 month ago. The problem has been unchanged. The problem occurs constantly. The cough is non-productive. Associated symptoms include nasal congestion and postnasal drip. Pertinent negatives include no chest pain, chills, ear congestion, ear pain, fever, headaches, heartburn, hemoptysis, myalgias, rash, rhinorrhea, shortness of breath, sweats, weight loss or wheezing. Nothing aggravates the symptoms. She has tried nothing for the symptoms. Her past medical history is significant for environmental allergies. There is no history of asthma, bronchiectasis, bronchitis, COPD, emphysema or pneumonia.      Review of Systems  Constitutional: Negative.  Negative for fever, chills, weight loss, diaphoresis, activity change, appetite change, fatigue and unexpected weight change.  HENT: Positive for postnasal drip. Negative for ear pain, congestion, facial swelling, rhinorrhea, sneezing, mouth sores, voice change and sinus pressure.   Eyes: Negative.   Respiratory: Positive for cough. Negative for hemoptysis, shortness of breath and wheezing.   Cardiovascular: Negative.  Negative for chest pain, palpitations and leg swelling.  Gastrointestinal: Negative.  Negative for heartburn, nausea, vomiting, abdominal pain, diarrhea, constipation and blood in stool.  Endocrine: Negative.   Genitourinary: Positive for dysuria and vaginal discharge. Negative for urgency, frequency, hematuria, flank pain, decreased urine volume, vaginal bleeding, enuresis, difficulty urinating, genital sores, vaginal pain, menstrual problem, pelvic pain and dyspareunia.  Musculoskeletal: Negative.  Negative for myalgias, back pain, joint swelling, arthralgias and gait problem.  Skin: Negative.  Negative for color change, pallor, rash and wound.  Allergic/Immunologic:  Positive for environmental allergies. Negative for food allergies and immunocompromised state.  Neurological: Negative.  Negative for headaches.  Hematological: Negative.  Negative for adenopathy. Does not bruise/bleed easily.  Psychiatric/Behavioral: Negative.        Objective:   Physical Exam  Vitals reviewed. Constitutional: She is oriented to person, place, and time. She appears well-developed and well-nourished.  Non-toxic appearance. She does not have a sickly appearance. She does not appear ill. No distress.  HENT:  Head: Normocephalic and atraumatic.  Right Ear: Hearing, tympanic membrane, external ear and ear canal normal.  Left Ear: Hearing, tympanic membrane, external ear and ear canal normal.  Nose: Mucosal edema present. No rhinorrhea, nose lacerations, sinus tenderness, nasal deformity, septal deviation or nasal septal hematoma. No epistaxis.  No foreign bodies. Right sinus exhibits no maxillary sinus tenderness and no frontal sinus tenderness. Left sinus exhibits no maxillary sinus tenderness and no frontal sinus tenderness.  Mouth/Throat: Oropharynx is clear and moist and mucous membranes are normal. Mucous membranes are not pale, not dry and not cyanotic. No oropharyngeal exudate, posterior oropharyngeal edema, posterior oropharyngeal erythema or tonsillar abscesses.  Eyes: Conjunctivae are normal. Right eye exhibits no discharge. Left eye exhibits no discharge. No scleral icterus.  Neck: Normal range of motion. Neck supple. No JVD present. No tracheal deviation present. No thyromegaly present.  Cardiovascular: Normal rate, regular rhythm, normal heart sounds and intact distal pulses.  Exam reveals no gallop and no friction rub.   No murmur heard. Pulmonary/Chest: No stridor.  Abdominal: Soft. Normal appearance and bowel sounds are normal. She exhibits no shifting dullness, no distension, no pulsatile liver, no fluid wave, no abdominal bruit, no ascites, no pulsatile midline  mass and no mass. There is no hepatosplenomegaly, splenomegaly or hepatomegaly. There is no tenderness. There is no rebound, no guarding and no CVA tenderness. Hernia confirmed negative in  the right inguinal area and confirmed negative in the left inguinal area.  Genitourinary: Rectum normal and vagina normal. Rectal exam shows no external hemorrhoid, no internal hemorrhoid, no fissure, no mass, no tenderness and anal tone normal. Guaiac negative stool. No breast swelling, tenderness, discharge or bleeding. Pelvic exam was performed with patient supine. No labial fusion. There is no rash, tenderness, lesion or injury on the right labia. There is no rash, tenderness, lesion or injury on the left labia. Uterus is not deviated, not enlarged, not fixed and not tender. Cervix exhibits no motion tenderness, no discharge and no friability. Right adnexum displays no mass, no tenderness and no fullness. Left adnexum displays no mass, no tenderness and no fullness. No erythema, tenderness or bleeding around the vagina. No foreign body around the vagina. No signs of injury around the vagina. No vaginal discharge found.  Musculoskeletal: Normal range of motion. She exhibits no edema and no tenderness.  Lymphadenopathy:    She has no cervical adenopathy.       Right: No inguinal adenopathy present.       Left: No inguinal adenopathy present.  Neurological: She is oriented to person, place, and time.  Skin: Skin is warm and dry. No rash noted. She is not diaphoretic. No erythema. No pallor.  Psychiatric: She has a normal mood and affect. Her behavior is normal. Judgment and thought content normal.     Lab Results  Component Value Date   WBC 3.7* 06/07/2012   HGB 12.5 06/07/2012   HCT 36.2 06/07/2012   PLT 142* 06/07/2012   GLUCOSE 122* 06/08/2012   CHOL 219* 06/07/2012   TRIG 62 06/07/2012   HDL 88 06/07/2012   LDLDIRECT 126.3 12/15/2011   LDLCALC 119* 06/07/2012   ALT 21 06/06/2012   AST 43* 06/06/2012   NA 135  06/08/2012   K 3.7 06/08/2012   CL 98 06/08/2012   CREATININE 1.37* 06/08/2012   BUN 18 06/08/2012   CO2 26 06/08/2012   TSH 0.192* 06/07/2012   INR 2.43* 06/08/2012   HGBA1C 5.7* 06/06/2012       Assessment & Plan:

## 2012-07-19 DIAGNOSIS — R05 Cough: Secondary | ICD-10-CM | POA: Insufficient documentation

## 2012-07-19 NOTE — Assessment & Plan Note (Signed)
Will treat the apparent UTI with cipro I await the results of her urine culture

## 2012-07-19 NOTE — Assessment & Plan Note (Signed)
Wet prep is unremarkable PAP smear is pending

## 2012-07-19 NOTE — Assessment & Plan Note (Signed)
She will stop lisinopril

## 2012-07-19 NOTE — Assessment & Plan Note (Signed)
Will check UA and urine clx

## 2012-07-21 LAB — CULTURE, URINE COMPREHENSIVE

## 2012-07-23 ENCOUNTER — Telehealth: Payer: Self-pay | Admitting: *Deleted

## 2012-07-23 MED ORDER — PROMETHAZINE-DM 6.25-15 MG/5ML PO SYRP: 5.0000 mL | ORAL_SOLUTION | Freq: Four times a day (QID) | ORAL | Status: DC | PRN

## 2012-07-23 NOTE — Telephone Encounter (Signed)
done

## 2012-07-23 NOTE — Telephone Encounter (Signed)
Pt called regarding results of UA and culture from last Wednesday's OV. Pt wants to know if she needs an antibiotic.

## 2012-07-23 NOTE — Telephone Encounter (Signed)
Yes she has a UTI and yes cipro was prescribed

## 2012-07-23 NOTE — Telephone Encounter (Signed)
Pt informed of results and rx. Pt would like rx sent to pharmacy for dry cough as discussed at OV.

## 2012-08-17 ENCOUNTER — Ambulatory Visit: Payer: Medicare Other | Admitting: Internal Medicine

## 2012-08-17 DIAGNOSIS — Z0289 Encounter for other administrative examinations: Secondary | ICD-10-CM

## 2012-09-06 ENCOUNTER — Encounter: Payer: Medicare Other | Admitting: Internal Medicine

## 2012-09-20 ENCOUNTER — Encounter: Payer: Self-pay | Admitting: *Deleted

## 2012-09-24 ENCOUNTER — Encounter: Payer: Medicare Other | Admitting: Internal Medicine

## 2012-09-27 ENCOUNTER — Ambulatory Visit (INDEPENDENT_AMBULATORY_CARE_PROVIDER_SITE_OTHER): Payer: Medicare Other | Admitting: Internal Medicine

## 2012-09-27 ENCOUNTER — Encounter: Payer: Self-pay | Admitting: Internal Medicine

## 2012-09-27 VITALS — BP 121/64 | HR 60 | Ht 66.0 in | Wt 139.8 lb

## 2012-09-27 DIAGNOSIS — Z95810 Presence of automatic (implantable) cardiac defibrillator: Secondary | ICD-10-CM

## 2012-09-27 DIAGNOSIS — I2510 Atherosclerotic heart disease of native coronary artery without angina pectoris: Secondary | ICD-10-CM

## 2012-09-27 DIAGNOSIS — I4891 Unspecified atrial fibrillation: Secondary | ICD-10-CM

## 2012-09-27 DIAGNOSIS — I480 Paroxysmal atrial fibrillation: Secondary | ICD-10-CM

## 2012-09-27 DIAGNOSIS — I2589 Other forms of chronic ischemic heart disease: Secondary | ICD-10-CM

## 2012-09-27 DIAGNOSIS — E039 Hypothyroidism, unspecified: Secondary | ICD-10-CM | POA: Insufficient documentation

## 2012-09-27 LAB — ICD DEVICE OBSERVATION
AL AMPLITUDE: 7.3 mv
ATRIAL PACING ICD: 43 pct
DEV-0020ICD: NEGATIVE
RV LEAD IMPEDENCE ICD: 534 Ohm
RV LEAD THRESHOLD: 0.8 V
VENTRICULAR PACING ICD: 2 pct

## 2012-09-27 NOTE — Assessment & Plan Note (Signed)
The patient's device was interrogated.  The information was reviewed. No changes were made in the programming.    

## 2012-09-27 NOTE — Progress Notes (Signed)
[facility] Patient Care Team: Etta Grandchild, MD as PCP - General Rollene Rotunda, MD (Cardiology)   HPI  [name] is a 62 y.o. female Seen in followup for ICD implanted for primary prevention in the setting of ischemic cardiomyopathy. 2006 she underwent generator replacement because of an infected device and underwent device generator replacement again in March 2014  She has had no intercurrent palpitations. She is on amiodarone warfarin and aspirin the latter for stents  Last TSH was 0.192 down from 23.36 months before; she is on thyroid replacement. Last transaminases were December 2013 were mildly abnormal 60/36   She has had intermittent rapid atrial raise-120 or so  She was hospitalized 12/13 for inappropriate ICD discharge related to relatively slow one-to-one atrial flutter in the context of amiodarone therapy pursued for atrial fibrillation that had been associated with inappropriate ICD discharges 2012.      Past Medical History  Diagnosis Date  . Spinal stenosis   . Numbness of foot     left foot  . Diverticulitis     Status post partial colectomy 1/12 with reversal in July 2012  . Colitis, ischemic   . DJD (degenerative joint disease)     History of multiple surgeries to the back, shoulder and knee  . Ischemic cardiomyopathy     Echo 06/16/10: EF 25-30%, anteroseptal and apical hypokinesis, moderate AI, mild MR  . Chronic systolic heart failure   . CAD (coronary artery disease)     a.  Ant MI 8/03 with stenting of the LAD;  b. staged PCI with Cypher DES to OM1 8/03;  c. s/p Cypher DES to LAD 7/04;  d.  multiple cardiac caths in past (chronically abnl ECG);    e. cardiac catheterization 3/12: LAD stent patent, distal LAD 40-50%, small ostial D1 80%, ostial D2 60%, OM-1 stent patent (20-30%), proximal RCA 50%, proximal to mid RCA 40-50%; f. cath 02/17/2011 - nonobs  . LV (left ventricular) mural thrombus     Chronic Coumadin therapy  . CKD (chronic kidney disease), stage  III     creatinine:  1.3 in 4/12;    . Tobacco abuse     history  . GERD (gastroesophageal reflux disease)   . Hypertension   . Lower GI bleed     August 2011  . Hypothyroidism   . Aortic insufficiency   . Pseudoaneurysm     History of, right forearm  . Hyperlipidemia   . OA (osteoarthritis)   . Abnormal EKG     Chronically abnormal EKG.   . Spinal stenosis   . Numbness of foot     Left foot  . Compression fracture 07/04/11    L2  . Anterior myocardial infarction 10/2001  . SVT (supraventricular tachycardia)     ? h/o AFib;  patient on long term amiodarone therapy  . PAF (paroxysmal atrial fibrillation)   . Anatomical narrow angle of right eye   . Heart murmur     "age 25-19"  . Shortness of breath     "at times; related to CHF"  . Shortness of breath on exertion   . 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 Graciela Husbands.   [name] AICD (automatic cardioverter/defibrillator) present 10/2004    explant; implant  . Blood transfusion   . Anemia   . H/O hiatal hernia   . Stroke     History of TIA and possible CVA; hospitalized 2004; on coumadin  . Systolic  heart failure   . Sinus bradycardia 03/05/2012    Past Surgical History  Procedure Laterality Date  . Back surgery    . Coronary angioplasty with stent placement  10/2001; 09/2002  . Cardiac defibrillator placement  07/2003; 10/2004  . Lumbar laminectomy/decompression microdiscectomy  10/2001    L5-S1/E-chart  . Knee arthroscopy      left  . Thyroidectomy  1970's  . Total knee arthroplasty  11/2003    left  . X-stop implantation  12/2004    L3-4; L4-5  . Fixation kyphoplasty thoracic spine  07/2007    T3, 4, 6 compression fractures  . Shoulder open rotator cuff repair  04/2008    right  . Lumbar laminectomy/decompression microdiscectomy  01/2010  . Colectomy  03/2010    sigmoid left  . Colostomy  03/2010    transverse  . Peripherally inserted central catheter insertion  03/2010    removed upon  discharge  . Colostomy closure  12/2010    reversal  . Tonsillectomy and adenoidectomy  1969  . Appendectomy  03/2010  . Cataract extraction      right    Current Outpatient Prescriptions  Medication Sig Dispense Refill  . amiodarone (PACERONE) 200 MG tablet Take 200 mg by mouth daily.      [name] aspirin EC 81 MG tablet Take 81 mg by mouth daily.      . calcium-vitamin D (OSCAL WITH D) 500-200 MG-UNIT per tablet Take 1 tablet by mouth 2 (two) times daily with a meal.      . carvedilol (COREG) 6.25 MG tablet Take 1 tablet (6.25 mg total) by mouth 2 (two) times daily with a meal.  60 tablet  11  . ezetimibe (ZETIA) 10 MG tablet Take 10 mg by mouth at bedtime.       . fexofenadine (ALLEGRA) 180 MG tablet Take 180 mg by mouth daily.       . fluticasone (FLONASE) 50 MCG/ACT nasal spray Place 2 sprays into the nose daily as needed for allergies.       . folic acid-pyridoxine-cyancobalamin (FOLTX) 2.5-25-2 MG TABS Take 1 tablet by mouth daily.       . furosemide (LASIX) 20 MG tablet Take 20 mg by mouth daily.      [name] HYDROcodone-acetaminophen (NORCO) 7.5-325 MG per tablet Take 1 tablet by mouth every 8 (eight) hours as needed for pain.      [name] levothyroxine (SYNTHROID, LEVOTHROID) 200 MCG tablet Take 200 mcg by mouth daily.      . methocarbamol (ROBAXIN) 500 MG tablet Take 500 mg by mouth 4 (four) times daily as needed (for spasms).       . nitroGLYCERIN (NITROSTAT) 0.4 MG SL tablet Place 0.4 mg under the tongue every 5 (five) minutes as needed for chest pain.       [name] omega-3 acid ethyl esters (LOVAZA) 1 G capsule Take 1 g by mouth daily.      . pantoprazole (PROTONIX) 40 MG tablet Take 40 mg by mouth 2 (two) times daily.       . polyethylene glycol (MIRALAX / GLYCOLAX) packet Take 17 g by mouth daily as needed (for constipation).       . potassium chloride SA (K-DUR,KLOR-CON) 20 MEQ tablet Take 20 mEq by mouth daily.      . pregabalin (LYRICA) 50 MG capsule Take 50 mg by mouth 3 (three) times daily.       . promethazine (PHENERGAN) 25 MG tablet Take 25 mg by  mouth every 6 (six) hours as needed for nausea.       . raloxifene (EVISTA) 60 MG tablet Take 60 mg by mouth daily.      [name] spironolactone (ALDACTONE) 25 MG tablet Take 25 mg by mouth daily.      [name] warfarin (COUMADIN) 3 MG tablet Take 3-4.5 mg by mouth daily. Takes 3mg  every day, except on Wednesday and Friday takes 4.5mg        No current facility-administered medications for this visit.    Allergies  Allergen Reactions  . Lansoprazole Nausea Only  . Penicillins Swelling    Throat swells  . Statins Other (See Comments)    cramps  . Lisinopril     cough  . Lactose Intolerance (Gi) Diarrhea and Other (See Comments)    Patient reports abdominal cramping and diarrhea with lactose products.     Review of Systems negative except from HPI and PMH  Physical Exam BP 121/64  Pulse 60  Ht 5\' 6"  (1.676 m)  Wt 139 lb 12.8 oz (63.413 kg)  BMI 22.58 kg/m2 Well developed and well nourished in no acute distress HENT normal E scleral and icterus clear Neck Supple JVP flat; carotids brisk and full Clear to ausculation  *Regular rate and rhythm, no murmurs gallops or rub Soft with active bowel sounds No clubbing cyanosis none Edema Alert and oriented, grossly normal motor and sensory function Skin Warm and Dry  6 if this  Assessment and  Plan

## 2012-09-27 NOTE — Patient Instructions (Signed)
Your physician recommends that you have lab work today: liver/tsh  Remote monitoring is used to monitor your Pacemaker of ICD from home. This monitoring reduces the number of office visits required to check your device to one time per year. It allows [place] to keep an eye on the functioning of your device to ensure it is working properly. You are scheduled for a device check from home on 12/27/12. You may send your transmission at any time that day. If you have a wireless device, the transmission will be sent automatically. After your physician reviews your transmission, you will receive a postcard with your next transmission date.  Your physician wants you to follow-up in: 1 year with Dr. Graciela Husbands. You will receive a reminder letter in the mail two months in advance. If you don't receive a letter, please call our office to schedule the follow-up appointment.

## 2012-09-27 NOTE — Assessment & Plan Note (Signed)
Continues with frequent episodes of atrial fibrillation  Comprising about 40%. There is actually a for atrial tachycardia. This may be a consequence of the amiodarone. We'll check amiodarone surveillance laboratories today. Thyroid functions were abnormal in March for this were abnormal in that

## 2012-09-27 NOTE — Assessment & Plan Note (Signed)
Will check TSH. I suspect that she is hyperthyroid

## 2012-09-28 LAB — HEPATIC FUNCTION PANEL
ALT: 54 U/L — ABNORMAL HIGH (ref 0–35)
AST: 129 U/L — ABNORMAL HIGH (ref 0–37)
Albumin: 3.7 g/dL (ref 3.5–5.2)
Alkaline Phosphatase: 85 U/L (ref 39–117)
Total Protein: 7.4 g/dL (ref 6.0–8.3)

## 2012-09-30 ENCOUNTER — Encounter: Payer: Self-pay | Admitting: Internal Medicine

## 2012-10-01 ENCOUNTER — Other Ambulatory Visit: Payer: Self-pay | Admitting: *Deleted

## 2012-10-08 ENCOUNTER — Other Ambulatory Visit (HOSPITAL_COMMUNITY): Payer: Self-pay | Admitting: Pain Medicine

## 2012-10-08 DIAGNOSIS — M545 Low back pain, unspecified: Secondary | ICD-10-CM

## 2012-10-12 ENCOUNTER — Ambulatory Visit (HOSPITAL_COMMUNITY)
Admission: RE | Admit: 2012-10-12 | Discharge: 2012-10-12 | Disposition: A | Discharge status: Home / Self Care | Payer: Medicare Other | Source: Ambulatory Visit | Attending: Pain Medicine | Admitting: Pain Medicine

## 2012-10-12 DIAGNOSIS — W19XXXA Unspecified fall, initial encounter: Secondary | ICD-10-CM | POA: Insufficient documentation

## 2012-10-12 DIAGNOSIS — M545 Low back pain, unspecified: Secondary | ICD-10-CM | POA: Insufficient documentation

## 2012-10-12 DIAGNOSIS — S22009A Unspecified fracture of unspecified thoracic vertebra, initial encounter for closed fracture: Secondary | ICD-10-CM | POA: Insufficient documentation

## 2012-10-12 DIAGNOSIS — M48061 Spinal stenosis, lumbar region without neurogenic claudication: Secondary | ICD-10-CM | POA: Insufficient documentation

## 2012-10-12 DIAGNOSIS — Z9889 Other specified postprocedural states: Secondary | ICD-10-CM | POA: Insufficient documentation

## 2012-11-13 ENCOUNTER — Other Ambulatory Visit (INDEPENDENT_AMBULATORY_CARE_PROVIDER_SITE_OTHER): Payer: Medicare Other

## 2012-11-13 DIAGNOSIS — R7989 Other specified abnormal findings of blood chemistry: Secondary | ICD-10-CM

## 2012-11-13 LAB — HEPATIC FUNCTION PANEL
Albumin: 3.4 g/dL — ABNORMAL LOW (ref 3.5–5.2)
Total Protein: 7.1 g/dL (ref 6.0–8.3)

## 2012-12-10 ENCOUNTER — Telehealth: Payer: Self-pay | Admitting: Internal Medicine

## 2012-12-10 NOTE — Telephone Encounter (Signed)
Spoke w/pt in regards to soreness at site. Per pt also has a scab that has developed at site. Pt will be seen before/after labwork. Pt aware.

## 2012-12-10 NOTE — Telephone Encounter (Signed)
I will forward this message to the device clinic to follow-up with patient.

## 2012-12-10 NOTE — Telephone Encounter (Signed)
New Problem  Pt states she is having soreness where the defib is located... Requests to have it looked at during her lab visit 9/23 @ 10 am.

## 2012-12-11 ENCOUNTER — Encounter: Payer: Self-pay | Admitting: Internal Medicine

## 2012-12-11 ENCOUNTER — Ambulatory Visit (INDEPENDENT_AMBULATORY_CARE_PROVIDER_SITE_OTHER): Payer: Medicare Other | Admitting: *Deleted

## 2012-12-11 ENCOUNTER — Ambulatory Visit (INDEPENDENT_AMBULATORY_CARE_PROVIDER_SITE_OTHER): Payer: Medicare Other | Admitting: Internal Medicine

## 2012-12-11 VITALS — BP 119/68 | HR 60 | Ht 66.0 in | Wt 140.0 lb

## 2012-12-11 DIAGNOSIS — I480 Paroxysmal atrial fibrillation: Secondary | ICD-10-CM

## 2012-12-11 DIAGNOSIS — I10 Essential (primary) hypertension: Secondary | ICD-10-CM

## 2012-12-11 DIAGNOSIS — T827XXA Infection and inflammatory reaction due to other cardiac and vascular devices, implants and grafts, initial encounter: Secondary | ICD-10-CM

## 2012-12-11 DIAGNOSIS — I4891 Unspecified atrial fibrillation: Secondary | ICD-10-CM

## 2012-12-11 LAB — HEPATIC FUNCTION PANEL
Alkaline Phosphatase: 53 U/L (ref 39–117)
Bilirubin, Direct: 0.2 mg/dL (ref 0.0–0.3)
Total Bilirubin: 0.7 mg/dL (ref 0.3–1.2)

## 2012-12-11 NOTE — Assessment & Plan Note (Signed)
The patient presents with a chronic subacute defibrillator infection. She will need to have extracted.it was initially implanted for primary prevention and has gone off only inappropriately for atrial tachycardia arrhythmias.  It will be reasonable to discuss following device generator extraction about reimplantation  Last echocardiogram was 2012 at which time her ejection fraction was 25%

## 2012-12-11 NOTE — Progress Notes (Signed)
[name] Patient Care Team: Etta Grandchild, MD as PCP - General Rollene Rotunda, MD (Cardiology)   HPI  [name] is a 62 y.o. female who is a prior patient of Dr. Deborah Chalk. She is status post ICD implantation for primary prevention in the setting of ischemic heart disease under treatment by me in 2005. In 2006 she underwent generator replacement because of a defective device.  Her device  reached ERI  And underwent generator replacement by Dr Fawn Kirk  She comes intoday with retractionand pain      She was hospitalized 12/13 for inappropriate ICD discharge related to relatively slow one-to-one atrial flutter in the context of amiodarone therapy pursued for atrial fibrillation that have been associated with inappropriate ICD discharges 2012    Her past medical history includes CAD, status post anterior myocardial infarction treated with a stent in the LAD and staged Cypher drug eluting stenting to the OM-1 in 8/03. She then had a Cypher drug-eluting stent placed to the LAD in 7/04. She's had multiple cardiac catheterizations in the past. Her last heart catheterization was 06/17/10 which demonstrated a patent stent in the LAD and a patent stent in the OM1 with nonobstructive disease elsewhere. She did have 80% stenosis in a small ostial Dx-1. She also has an ischemic cardiomyopathy.  Her last echocardiogram 06/08/10: EF 25-30%, anteroseptal and apical akinesis, moderate AI and mild MR. Left atrial size was normal    Past Medical History  Diagnosis Date  . Spinal stenosis   . Numbness of foot     left foot  . Diverticulitis     Status post partial colectomy 1/12 with reversal in July 2012  . Colitis, ischemic   . DJD (degenerative joint disease)     History of multiple surgeries to the back, shoulder and knee  . Ischemic cardiomyopathy     Echo 06/16/10: EF 25-30%, anteroseptal and apical hypokinesis, moderate AI, mild MR  . Chronic systolic heart failure   . CAD (coronary artery disease)    a.  Ant MI 8/03 with stenting of the LAD;  b. staged PCI with Cypher DES to OM1 8/03;  c. s/p Cypher DES to LAD 7/04;  d.  multiple cardiac caths in past (chronically abnl ECG);    e. cardiac catheterization 3/12: LAD stent patent, distal LAD 40-50%, small ostial D1 80%, ostial D2 60%, OM-1 stent patent (20-30%), proximal RCA 50%, proximal to mid RCA 40-50%; f. cath 02/17/2011 - nonobs  . LV (left ventricular) mural thrombus     Chronic Coumadin therapy  . CKD (chronic kidney disease), stage III     creatinine:  1.3 in 4/12;    . Tobacco abuse     history  . GERD (gastroesophageal reflux disease)   . Hypertension   . Lower GI bleed     August 2011  . Hypothyroidism     TSH 0.19 3/14  . Aortic insufficiency   . Pseudoaneurysm     History of, right forearm  . Hyperlipidemia   . Compression fracture 07/04/11    L2  . 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 Dual implantable cardioverter-defibrillator BSx     Implant 2005, gen change 2005,14  . PAF (paroxysmal atrial fibrillation)   . Anatomical narrow angle of right eye   . Blood transfusion   . Anemia   . H/O hiatal hernia   . Stroke  History of TIA and possible CVA; hospitalized 2004; on coumadin  . Sinus bradycardia 03/05/2012    Past Surgical History  Procedure Laterality Date  . Back surgery    . Coronary angioplasty with stent placement  10/2001; 09/2002  . Cardiac defibrillator placement  07/2003; 10/2004  . Lumbar laminectomy/decompression microdiscectomy  10/2001    L5-S1/E-chart  . Knee arthroscopy      left  . Thyroidectomy  1970's  . Total knee arthroplasty  11/2003    left  . X-stop implantation  12/2004    L3-4; L4-5  . Fixation kyphoplasty thoracic spine  07/2007    T3, 4, 6 compression fractures  . Shoulder open rotator cuff repair  04/2008    right  . Lumbar laminectomy/decompression microdiscectomy   01/2010  . Colectomy  03/2010    sigmoid left  . Colostomy  03/2010    transverse  . Peripherally inserted central catheter insertion  03/2010    removed upon discharge  . Colostomy closure  12/2010    reversal  . Tonsillectomy and adenoidectomy  1969  . Appendectomy  03/2010  . Cataract extraction      right    Current Outpatient Prescriptions  Medication Sig Dispense Refill  . aspirin EC 81 MG tablet Take 81 mg by mouth daily.      . calcium-vitamin D (OSCAL WITH D) 500-200 MG-UNIT per tablet Take 1 tablet by mouth 2 (two) times daily with a meal.      . carvedilol (COREG) 6.25 MG tablet Take 1 tablet (6.25 mg total) by mouth 2 (two) times daily with a meal.  60 tablet  11  . ezetimibe (ZETIA) 10 MG tablet Take 10 mg by mouth at bedtime.       . fexofenadine (ALLEGRA) 180 MG tablet Take 180 mg by mouth daily.       . fluticasone (FLONASE) 50 MCG/ACT nasal spray Place 2 sprays into the nose daily as needed for allergies.       . folic acid-pyridoxine-cyancobalamin (FOLTX) 2.5-25-2 MG TABS Take 1 tablet by mouth daily.       . furosemide (LASIX) 20 MG tablet Take 20 mg by mouth daily.      [name] HYDROcodone-acetaminophen (NORCO) 7.5-325 MG per tablet Take 1 tablet by mouth every 8 (eight) hours as needed for pain.      [name] levothyroxine (SYNTHROID, LEVOTHROID) 200 MCG tablet Take 200 mcg by mouth daily.      . methocarbamol (ROBAXIN) 500 MG tablet Take 500 mg by mouth 4 (four) times daily as needed (for spasms).       . nitroGLYCERIN (NITROSTAT) 0.4 MG SL tablet Place 0.4 mg under the tongue every 5 (five) minutes as needed for chest pain.       [name] omega-3 acid ethyl esters (LOVAZA) 1 G capsule Take 1 g by mouth daily.      . pantoprazole (PROTONIX) 40 MG tablet Take 40 mg by mouth 2 (two) times daily.       . polyethylene glycol (MIRALAX / GLYCOLAX) packet Take 17 g by mouth daily as needed (for constipation).       . potassium chloride SA (K-DUR,KLOR-CON) 20 MEQ tablet Take 20 mEq by mouth  daily.      . pregabalin (LYRICA) 50 MG capsule Take 50 mg by mouth 3 (three) times daily.      . promethazine (PHENERGAN) 25 MG tablet Take 25 mg by mouth every 6 (six) hours as needed for nausea.       [name]  raloxifene (EVISTA) 60 MG tablet Take 60 mg by mouth daily.      [name] spironolactone (ALDACTONE) 25 MG tablet Take 25 mg by mouth daily.      [name] warfarin (COUMADIN) 3 MG tablet Take 3-4.5 mg by mouth daily. Takes 3mg  every day, except on Wednesday and Friday takes 4.5mg        No current facility-administered medications for this visit.    Allergies  Allergen Reactions  . Lansoprazole Nausea Only  . Penicillins Swelling    Throat swells  . Statins Other (See Comments)    cramps  . Lisinopril     cough  . Lactose Intolerance (Gi) Diarrhea and Other (See Comments)    Patient reports abdominal cramping and diarrhea with lactose products.     Review of Systems negative except from HPI and PMH  Physical Exam BP 119/68  Pulse 60  Ht 5\' 6"  (1.676 m)  Wt 140 lb (63.504 kg)  BMI 22.61 kg/m2 Well developed and well nourished in no acute distress HENT normal E scleral and icterus clear Neck Supple JVP flat; carotids brisk and full Clear to ausculation Device pocket is inflamed and tender and retracted  Regular rate and rhythm, no murmurs gallops or rub Soft with active bowel sounds No clubbing cyanosis none Edema Alert and oriented, grossly normal motor and sensory function Skin Warm and Dry    Assessment and  Plan

## 2012-12-13 ENCOUNTER — Telehealth: Payer: Self-pay | Admitting: Internal Medicine

## 2012-12-13 NOTE — Telephone Encounter (Signed)
New Problem:  Pt states she would like to speak to the nurse surgery/defib placement schedule.

## 2012-12-14 ENCOUNTER — Encounter (HOSPITAL_COMMUNITY): Payer: Self-pay | Admitting: Pharmacy Technician

## 2012-12-14 ENCOUNTER — Encounter: Payer: Self-pay | Admitting: Cardiology

## 2012-12-14 ENCOUNTER — Ambulatory Visit (INDEPENDENT_AMBULATORY_CARE_PROVIDER_SITE_OTHER): Payer: Medicare Other | Admitting: Cardiology

## 2012-12-14 ENCOUNTER — Encounter (HOSPITAL_COMMUNITY): Payer: Self-pay | Admitting: *Deleted

## 2012-12-14 VITALS — BP 141/78 | HR 76 | Temp 98.4°F | Ht 66.0 in | Wt 142.2 lb

## 2012-12-14 DIAGNOSIS — I2510 Atherosclerotic heart disease of native coronary artery without angina pectoris: Secondary | ICD-10-CM

## 2012-12-14 DIAGNOSIS — T827XXD Infection and inflammatory reaction due to other cardiac and vascular devices, implants and grafts, subsequent encounter: Secondary | ICD-10-CM

## 2012-12-14 DIAGNOSIS — Z5189 Encounter for other specified aftercare: Secondary | ICD-10-CM

## 2012-12-14 DIAGNOSIS — L989 Disorder of the skin and subcutaneous tissue, unspecified: Secondary | ICD-10-CM

## 2012-12-14 LAB — CBC WITH DIFFERENTIAL/PLATELET
Basophils Relative: 0 % (ref 0.0–3.0)
HCT: 42 % (ref 36.0–46.0)
MCHC: 32.9 g/dL (ref 30.0–36.0)
MCV: 105.5 fl — ABNORMAL HIGH (ref 78.0–100.0)
Neutrophils Relative %: 52 % (ref 43.0–77.0)
Platelets: 181 10*3/uL (ref 150.0–400.0)

## 2012-12-14 LAB — BASIC METABOLIC PANEL
BUN: 14 mg/dL (ref 6–23)
CO2: 27 mEq/L (ref 19–32)
Chloride: 104 mEq/L (ref 96–112)
Creatinine, Ser: 1 mg/dL (ref 0.4–1.2)

## 2012-12-14 LAB — PROTIME-INR: INR: 1.4 ratio — ABNORMAL HIGH (ref 0.8–1.0)

## 2012-12-14 NOTE — Patient Instructions (Addendum)
Will call patient this pm in regards to Monday device extraction after talking with Dr Ladona Ridgel

## 2012-12-14 NOTE — Telephone Encounter (Addendum)
Spoke with patient and her device has come through the skin.  I spoke with Alycia Rossetti at the surgeons office and was given the day of Oct, 13,2014.  Patient is wanting to try and gets this done ASAP as it is coming through the skin and draining.  I have spoken to Dr Ladona Ridgel who is going to contact the surgeons office and try and move the case up to next Fri.  I let the patient know I would call her back after Dr Ladona Ridgel speaks with the surgeons and give her date and time.  Dr Ladona Ridgel says as long as she remains afebrile she should keep the area covered and report as scheduled.  If she begins to run a fever she should report to the ER  Patient came in today and the area was dressed and she is now scheduled for Monday morning at 7:30am. She is aware of the time and will start holding her Coumadin today

## 2012-12-14 NOTE — Progress Notes (Signed)
Pt denies SOB and chest pain. Pt is under the care of dr. Verdis Prime ( cardiologist) at [facility]. Pt states that MD advised her to stop Aspirin and Coumadin. Pt made aware to stop NSAIDS and herbal medications.

## 2012-12-14 NOTE — Telephone Encounter (Signed)
Follow up     Patient is asking for Dr. Ladona Ridgel nurse to call her back .  Regarding her defib removal.

## 2012-12-16 NOTE — Progress Notes (Signed)
 ELECTROPHYSIOLOGY OFFICE NOTE  Patient ID: [name] MRN: [id number], DOB/AGE: 63/18/1952   Date of Visit: 12/14/2012  Primary Physician: Thomas Jones, MD Primary Cardiologist / EP: Henry Smith, MD / Steve Klein, MD Reason for Visit: Wound check  History of Present Illness  [name] is a 62 y.o. female with an ischemic CM s/p ICD implant in 2005, s/p ICD generator change March 2014, PAF, CAD, CKD, HTN and prior CVA who presents today as an add on for wound check. She was evaluated by Dr. Klein on 12/11/2012 for ICD pocket retraction and pain, found to have chronic subacute ICD infection. She is awaiting ICD system extraction. Today she called our office to report the site is now draining.    Since last being seen in our clinic, she reports the ICD site is draining clear fluid. She denies purulent drainage. She denies fever or chills. She denies chest pain or shortness of breath. She denies palpitations, dizziness, near syncope or syncope. She denies LE swelling, orthopnea, PND or recent weight gain. She denies ICD shocks.  Past Medical History Past Medical History  Diagnosis Date  . Spinal stenosis   . Numbness of foot     left foot  . Diverticulitis     Status post partial colectomy 1/12 with reversal in July 2012  . Colitis, ischemic   . DJD (degenerative joint disease)     History of multiple surgeries to the back, shoulder and knee  . Ischemic cardiomyopathy     Echo 06/16/10: EF 25-30%, anteroseptal and apical hypokinesis, moderate AI, mild MR  . Chronic systolic heart failure   . CAD (coronary artery disease)     a.  Ant MI 8/03 with stenting of the LAD;  b. staged PCI with Cypher DES to OM1 8/03;  c. s/p Cypher DES to LAD 7/04;  d.  multiple cardiac caths in past (chronically abnl ECG);    e. cardiac catheterization 3/12: LAD stent patent, distal LAD 40-50%, small ostial D1 80%, ostial D2 60%, OM-1 stent patent (20-30%), proximal RCA 50%, proximal to mid RCA 40-50%;  f. cath 02/17/2011 - nonobs  . LV (left ventricular) mural thrombus     Chronic Coumadin therapy  . CKD (chronic kidney disease), stage III     creatinine:  1.3 in 4/12;    . Tobacco abuse     history  . GERD (gastroesophageal reflux disease)   . Hypertension   . Lower GI bleed     August 2011  . Hypothyroidism     TSH 0.19 3/14  . Aortic insufficiency   . Pseudoaneurysm     History of, right forearm  . Hyperlipidemia   . Compression fracture 07/04/11    L2  . 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 Dual implantable cardioverter-defibrillator BSx     Implant 2005, gen change 2005,14  . PAF (paroxysmal atrial fibrillation)   . Anatomical narrow angle of right eye   . Blood transfusion   . Anemia   . H/O hiatal hernia   . Stroke     History of TIA and possible CVA; hospitalized 2004; on coumadin  . Sinus bradycardia 03/05/2012  . Myocardial infarction   . CHF (congestive heart failure)   . Automatic implantable cardioverter-defibrillator in situ     Past Surgical History Past Surgical History  Procedure Laterality Date  . Back surgery    .   Coronary angioplasty with stent placement  10/2001; 09/2002  . Cardiac defibrillator placement  07/2003; 10/2004  . Lumbar laminectomy/decompression microdiscectomy  10/2001    L5-S1/E-chart  . Knee arthroscopy      left  . Thyroidectomy  1970's  . Total knee arthroplasty  11/2003    left  . X-stop implantation  12/2004    L3-4; L4-5  . Fixation kyphoplasty thoracic spine  07/2007    T3, 4, 6 compression fractures  . Shoulder open rotator cuff repair  04/2008    right  . Lumbar laminectomy/decompression microdiscectomy  01/2010  . Colectomy  03/2010    sigmoid left  . Colostomy  03/2010    transverse  . Peripherally inserted central catheter insertion  03/2010    removed upon discharge  . Colostomy closure  12/2010     reversal  . Tonsillectomy and adenoidectomy  1969  . Appendectomy  03/2010  . Cataract extraction      right    Allergies/Intolerances Allergies  Allergen Reactions  . Lansoprazole Nausea Only  . Penicillins Swelling    Throat swells  . Statins Other (See Comments)    cramps  . Lisinopril     cough  . Lactose Intolerance (Gi) Diarrhea and Other (See Comments)    Patient reports abdominal cramping and diarrhea with lactose products.     Current Home Medications Current Outpatient Prescriptions  Medication Sig Dispense Refill  . aspirin EC 81 MG tablet Take 81 mg by mouth daily.      . ezetimibe (ZETIA) 10 MG tablet Take 10 mg by mouth at bedtime.       . fexofenadine (ALLEGRA) 180 MG tablet Take 180 mg by mouth daily.       . fluticasone (FLONASE) 50 MCG/ACT nasal spray Place 2 sprays into the nose daily as needed for allergies.       . folic acid-pyridoxine-cyancobalamin (FOLTX) 2.5-25-2 MG TABS Take 1 tablet by mouth daily.       . furosemide (LASIX) 20 MG tablet Take 20 mg by mouth daily.      . HYDROcodone-acetaminophen (NORCO) 7.5-325 MG per tablet Take 1 tablet by mouth every 8 (eight) hours as needed for pain.      . levothyroxine (SYNTHROID, LEVOTHROID) 200 MCG tablet Take 200 mcg by mouth daily.      . methocarbamol (ROBAXIN) 500 MG tablet Take 500 mg by mouth 4 (four) times daily as needed (for spasms).       . nitroGLYCERIN (NITROSTAT) 0.4 MG SL tablet Place 0.4 mg under the tongue every 5 (five) minutes as needed for chest pain.       . omega-3 acid ethyl esters (LOVAZA) 1 G capsule Take 1 g by mouth daily.      . pantoprazole (PROTONIX) 40 MG tablet Take 40 mg by mouth 2 (two) times daily.       . polyethylene glycol (MIRALAX / GLYCOLAX) packet Take 17 g by mouth daily as needed (for constipation).       . potassium chloride SA (K-DUR,KLOR-CON) 20 MEQ tablet Take 20 mEq by mouth daily.      . pregabalin (LYRICA) 50 MG capsule Take 50 mg by mouth 3 (three) times daily.       . raloxifene (EVISTA) 60 MG tablet Take 60 mg by mouth daily.      . spironolactone (ALDACTONE) 25 MG tablet Take 25 mg by mouth daily.      . warfarin (COUMADIN) 3 MG tablet Take   3-4.5 mg by mouth daily. Takes 3mg every day, except on Wednesday and Friday takes 4.5mg      . carvedilol (COREG) 6.25 MG tablet Take 6.25 mg by mouth 2 (two) times daily with a meal.       Physical Exam Vitals: Blood pressure 141/78, pulse 76, temperature 98.4 F (36.9 C), height 5' 6" (1.676 m), weight 142 lb 4 oz (64.524 kg).  General: Well developed, well appearing 62 y.o. female in no acute distress. Skin: Left upper chest / implant site is mildly erythematous and retracted on the left lateral edge with a small area of eschar. Tender to palpation. No purulent drainage or warmth.   Assessment and Plan 1. ICD pocket retraction / erosion, chronic subacute ICD infection As outlined in Dr. Klein's note from 12/11/2012, Ms. Servais needs to have the entire ICD system extracted. This is scheduled on Monday, 12/17/2012. Today we dressed the site with clean 4x4 gauze and Tegaderm bandage without difficulty. She was instructed not to get the site wet. She was also instructed to hold Coumadin in preparation for the procedure.   Signed, Macallan Ord, PA-C 12/16/2012, 11:23 PM   

## 2012-12-17 ENCOUNTER — Encounter (HOSPITAL_COMMUNITY)
Admission: RE | Disposition: A | Discharge status: Home / Self Care | Payer: Self-pay | Source: Ambulatory Visit | Attending: Internal Medicine

## 2012-12-17 ENCOUNTER — Inpatient Hospital Stay (HOSPITAL_COMMUNITY): Payer: Medicare Other | Admitting: Anesthesiology

## 2012-12-17 ENCOUNTER — Inpatient Hospital Stay (HOSPITAL_COMMUNITY)
Admission: RE | Admit: 2012-12-17 | Discharge: 2012-12-20 | DRG: 261 | Disposition: A | Discharge status: Home / Self Care | Payer: Medicare Other | Source: Ambulatory Visit | Attending: Internal Medicine | Admitting: Internal Medicine

## 2012-12-17 ENCOUNTER — Encounter (HOSPITAL_COMMUNITY): Payer: Self-pay | Admitting: *Deleted

## 2012-12-17 ENCOUNTER — Encounter (HOSPITAL_COMMUNITY): Payer: Self-pay | Admitting: Anesthesiology

## 2012-12-17 DIAGNOSIS — I252 Old myocardial infarction: Secondary | ICD-10-CM

## 2012-12-17 DIAGNOSIS — I359 Nonrheumatic aortic valve disorder, unspecified: Secondary | ICD-10-CM | POA: Diagnosis present

## 2012-12-17 DIAGNOSIS — N189 Chronic kidney disease, unspecified: Secondary | ICD-10-CM

## 2012-12-17 DIAGNOSIS — I5189 Other ill-defined heart diseases: Secondary | ICD-10-CM | POA: Diagnosis present

## 2012-12-17 DIAGNOSIS — Z7901 Long term (current) use of anticoagulants: Secondary | ICD-10-CM

## 2012-12-17 DIAGNOSIS — Z87891 Personal history of nicotine dependence: Secondary | ICD-10-CM

## 2012-12-17 DIAGNOSIS — N390 Urinary tract infection, site not specified: Secondary | ICD-10-CM

## 2012-12-17 DIAGNOSIS — R300 Dysuria: Secondary | ICD-10-CM

## 2012-12-17 DIAGNOSIS — I2589 Other forms of chronic ischemic heart disease: Secondary | ICD-10-CM | POA: Diagnosis present

## 2012-12-17 DIAGNOSIS — I10 Essential (primary) hypertension: Secondary | ICD-10-CM

## 2012-12-17 DIAGNOSIS — R05 Cough: Secondary | ICD-10-CM

## 2012-12-17 DIAGNOSIS — Z79899 Other long term (current) drug therapy: Secondary | ICD-10-CM

## 2012-12-17 DIAGNOSIS — K449 Diaphragmatic hernia without obstruction or gangrene: Secondary | ICD-10-CM | POA: Diagnosis present

## 2012-12-17 DIAGNOSIS — T827XXA Infection and inflammatory reaction due to other cardiac and vascular devices, implants and grafts, initial encounter: Secondary | ICD-10-CM

## 2012-12-17 DIAGNOSIS — M1990 Unspecified osteoarthritis, unspecified site: Secondary | ICD-10-CM

## 2012-12-17 DIAGNOSIS — N179 Acute kidney failure, unspecified: Secondary | ICD-10-CM | POA: Diagnosis not present

## 2012-12-17 DIAGNOSIS — N183 Chronic kidney disease, stage 3 unspecified: Secondary | ICD-10-CM | POA: Diagnosis present

## 2012-12-17 DIAGNOSIS — Z9861 Coronary angioplasty status: Secondary | ICD-10-CM

## 2012-12-17 DIAGNOSIS — I2510 Atherosclerotic heart disease of native coronary artery without angina pectoris: Secondary | ICD-10-CM | POA: Diagnosis present

## 2012-12-17 DIAGNOSIS — E039 Hypothyroidism, unspecified: Secondary | ICD-10-CM | POA: Diagnosis present

## 2012-12-17 DIAGNOSIS — Z8673 Personal history of transient ischemic attack (TIA), and cerebral infarction without residual deficits: Secondary | ICD-10-CM

## 2012-12-17 DIAGNOSIS — I5022 Chronic systolic (congestive) heart failure: Secondary | ICD-10-CM | POA: Diagnosis present

## 2012-12-17 DIAGNOSIS — N898 Other specified noninflammatory disorders of vagina: Secondary | ICD-10-CM

## 2012-12-17 DIAGNOSIS — E785 Hyperlipidemia, unspecified: Secondary | ICD-10-CM | POA: Diagnosis present

## 2012-12-17 DIAGNOSIS — Z7982 Long term (current) use of aspirin: Secondary | ICD-10-CM

## 2012-12-17 DIAGNOSIS — I129 Hypertensive chronic kidney disease with stage 1 through stage 4 chronic kidney disease, or unspecified chronic kidney disease: Secondary | ICD-10-CM | POA: Diagnosis present

## 2012-12-17 DIAGNOSIS — I4891 Unspecified atrial fibrillation: Secondary | ICD-10-CM | POA: Diagnosis present

## 2012-12-17 DIAGNOSIS — I480 Paroxysmal atrial fibrillation: Secondary | ICD-10-CM

## 2012-12-17 DIAGNOSIS — M4800 Spinal stenosis, site unspecified: Secondary | ICD-10-CM

## 2012-12-17 DIAGNOSIS — I639 Cerebral infarction, unspecified: Secondary | ICD-10-CM

## 2012-12-17 DIAGNOSIS — Z96659 Presence of unspecified artificial knee joint: Secondary | ICD-10-CM

## 2012-12-17 DIAGNOSIS — Z95810 Presence of automatic (implantable) cardiac defibrillator: Secondary | ICD-10-CM

## 2012-12-17 DIAGNOSIS — I255 Ischemic cardiomyopathy: Secondary | ICD-10-CM

## 2012-12-17 DIAGNOSIS — Y831 Surgical operation with implant of artificial internal device as the cause of abnormal reaction of the patient, or of later complication, without mention of misadventure at the time of the procedure: Secondary | ICD-10-CM | POA: Diagnosis present

## 2012-12-17 DIAGNOSIS — K219 Gastro-esophageal reflux disease without esophagitis: Secondary | ICD-10-CM | POA: Diagnosis present

## 2012-12-17 DIAGNOSIS — E871 Hypo-osmolality and hyponatremia: Secondary | ICD-10-CM | POA: Diagnosis not present

## 2012-12-17 DIAGNOSIS — I509 Heart failure, unspecified: Secondary | ICD-10-CM | POA: Diagnosis present

## 2012-12-17 HISTORY — DX: Acute myocardial infarction, unspecified: I21.9

## 2012-12-17 HISTORY — PX: ICD LEAD REMOVAL: SHX5855

## 2012-12-17 HISTORY — DX: Presence of automatic (implantable) cardiac defibrillator: Z95.810

## 2012-12-17 HISTORY — PX: CARDIAC DEFIBRILLATOR REMOVAL: SHX1291

## 2012-12-17 LAB — BASIC METABOLIC PANEL
Calcium: 8.9 mg/dL (ref 8.4–10.5)
Creatinine, Ser: 0.98 mg/dL (ref 0.50–1.10)
GFR calc Af Amer: 70 mL/min — ABNORMAL LOW (ref >90)
GFR calc non Af Amer: 60 mL/min — ABNORMAL LOW (ref >90)
Glucose, Bld: 86 mg/dL (ref 70–99)
Potassium: 4.2 mEq/L (ref 3.5–5.1)
Sodium: 137 mEq/L (ref 135–145)

## 2012-12-17 LAB — TYPE AND SCREEN: ABO/RH(D): A POS

## 2012-12-17 LAB — CBC
HCT: 33.8 % — ABNORMAL LOW (ref 36.0–46.0)
Hemoglobin: 13.5 g/dL (ref 12.0–15.0)
Hemoglobin: 11.6 g/dL — ABNORMAL LOW (ref 12.0–15.0)
MCH: 35.7 pg — ABNORMAL HIGH (ref 26.0–34.0)
MCHC: 34.3 g/dL (ref 30.0–36.0)
MCV: 102.1 fL — ABNORMAL HIGH (ref 78.0–100.0)
Platelets: 171 10*3/uL (ref 150–400)
RBC: 3.78 MIL/uL — ABNORMAL LOW (ref 3.87–5.11)
RDW: 16.3 % — ABNORMAL HIGH (ref 11.5–15.5)
WBC: 4.4 10*3/uL (ref 4.0–10.5)

## 2012-12-17 LAB — PROTIME-INR
INR: 1.03 (ref 0.00–1.49)
Prothrombin Time: 13.3 seconds (ref 11.6–15.2)

## 2012-12-17 LAB — CREATININE, SERUM: GFR calc Af Amer: 71 mL/min — ABNORMAL LOW (ref >90)

## 2012-12-17 SURGERY — REMOVAL, ELECTRODE LEAD, ICD
Anesthesia: General | Site: Chest | Laterality: Left | Wound class: Dirty or Infected

## 2012-12-17 MED ORDER — ONDANSETRON HCL 4 MG/2ML IJ SOLN
INTRAMUSCULAR | Status: DC | PRN
  Administered 2012-12-17: 4 mg via INTRAVENOUS

## 2012-12-17 MED ORDER — EZETIMIBE 10 MG PO TABS
10.0000 mg | ORAL_TABLET | Freq: Every day | ORAL | Status: DC
Start: 2012-12-17 — End: 2012-12-20
  Administered 2012-12-17 – 2012-12-19 (×3): 10 mg via ORAL
  Filled 2012-12-17 (×4): qty 1

## 2012-12-17 MED ORDER — SPIRONOLACTONE 25 MG PO TABS
25.0000 mg | ORAL_TABLET | Freq: Every day | ORAL | Status: DC
  Administered 2012-12-17 – 2012-12-20 (×4): 25 mg via ORAL
  Filled 2012-12-17 (×4): qty 1

## 2012-12-17 MED ORDER — OMEGA-3-ACID ETHYL ESTERS 1 G PO CAPS
1.0000 g | ORAL_CAPSULE | Freq: Every day | ORAL | Status: DC
  Administered 2012-12-17 – 2012-12-20 (×4): 1 g via ORAL
  Filled 2012-12-17 (×4): qty 1

## 2012-12-17 MED ORDER — WARFARIN - PHYSICIAN DOSING INPATIENT: Freq: Every day | Status: DC

## 2012-12-17 MED ORDER — GLYCOPYRROLATE 0.2 MG/ML IJ SOLN
INTRAMUSCULAR | Status: DC | PRN
  Administered 2012-12-17: .6 mg via INTRAVENOUS
  Administered 2012-12-17: .2 mg via INTRAVENOUS

## 2012-12-17 MED ORDER — PANTOPRAZOLE SODIUM 40 MG PO TBEC
40.0000 mg | DELAYED_RELEASE_TABLET | Freq: Two times a day (BID) | ORAL | Status: DC
  Administered 2012-12-17 – 2012-12-20 (×7): 40 mg via ORAL
  Filled 2012-12-17 (×7): qty 1

## 2012-12-17 MED ORDER — LIDOCAINE HCL (PF) 1 % IJ SOLN: INTRAMUSCULAR | Status: DC | PRN

## 2012-12-17 MED ORDER — SODIUM CHLORIDE 0.9 % IR SOLN
Status: DC | PRN
  Administered 2012-12-17: 09:00:00

## 2012-12-17 MED ORDER — 0.9 % SODIUM CHLORIDE (POUR BTL) OPTIME
TOPICAL | Status: DC | PRN
  Administered 2012-12-17: 1000 mL

## 2012-12-17 MED ORDER — PREGABALIN 50 MG PO CAPS
50.0000 mg | ORAL_CAPSULE | Freq: Three times a day (TID) | ORAL | Status: DC
  Administered 2012-12-17 – 2012-12-20 (×10): 50 mg via ORAL
  Filled 2012-12-17 (×10): qty 1

## 2012-12-17 MED ORDER — RALOXIFENE HCL 60 MG PO TABS
60.0000 mg | ORAL_TABLET | Freq: Every day | ORAL | Status: DC
  Administered 2012-12-17 – 2012-12-20 (×4): 60 mg via ORAL
  Filled 2012-12-17 (×4): qty 1

## 2012-12-17 MED ORDER — LEVOTHYROXINE SODIUM 200 MCG PO TABS
200.0000 ug | ORAL_TABLET | Freq: Every day | ORAL | Status: DC
  Administered 2012-12-18 – 2012-12-20 (×3): 200 ug via ORAL
  Filled 2012-12-17 (×5): qty 1

## 2012-12-17 MED ORDER — FA-PYRIDOXINE-CYANOCOBALAMIN 2.5-25-2 MG PO TABS
1.0000 | ORAL_TABLET | Freq: Every day | ORAL | Status: DC
  Administered 2012-12-17 – 2012-12-20 (×4): 1 via ORAL
  Filled 2012-12-17 (×4): qty 1

## 2012-12-17 MED ORDER — HYDROMORPHONE HCL PF 1 MG/ML IJ SOLN: 0.2500 mg | INTRAMUSCULAR | Status: DC | PRN

## 2012-12-17 MED ORDER — POLYETHYLENE GLYCOL 3350 17 G PO PACK
17.0000 g | PACK | Freq: Every day | ORAL | Status: DC | PRN
  Filled 2012-12-17: qty 1

## 2012-12-17 MED ORDER — WARFARIN SODIUM 4 MG PO TABS
4.5000 mg | ORAL_TABLET | ORAL | Status: DC
  Administered 2012-12-19 – 2012-12-20 (×2): 4.5 mg via ORAL
  Filled 2012-12-17 (×2): qty 1

## 2012-12-17 MED ORDER — METHOCARBAMOL 500 MG PO TABS
500.0000 mg | ORAL_TABLET | Freq: Four times a day (QID) | ORAL | Status: DC | PRN
  Administered 2012-12-19: 500 mg via ORAL
  Filled 2012-12-17 (×2): qty 1

## 2012-12-17 MED ORDER — HEPARIN SODIUM (PORCINE) 5000 UNIT/ML IJ SOLN
5000.0000 [IU] | Freq: Three times a day (TID) | INTRAMUSCULAR | Status: DC
  Administered 2012-12-17 – 2012-12-19 (×5): 5000 [IU] via SUBCUTANEOUS
  Filled 2012-12-17 (×9): qty 1

## 2012-12-17 MED ORDER — LORATADINE 10 MG PO TABS
10.0000 mg | ORAL_TABLET | Freq: Every day | ORAL | Status: DC
  Administered 2012-12-17 – 2012-12-20 (×4): 10 mg via ORAL
  Filled 2012-12-17 (×4): qty 1

## 2012-12-17 MED ORDER — PROPOFOL 10 MG/ML IV BOLUS
INTRAVENOUS | Status: DC | PRN
  Administered 2012-12-17: 70 mg via INTRAVENOUS

## 2012-12-17 MED ORDER — MIDAZOLAM HCL 5 MG/5ML IJ SOLN
INTRAMUSCULAR | Status: DC | PRN
  Administered 2012-12-17: 1 mg via INTRAVENOUS

## 2012-12-17 MED ORDER — ROCURONIUM BROMIDE 100 MG/10ML IV SOLN
INTRAVENOUS | Status: DC | PRN
  Administered 2012-12-17: 40 mg via INTRAVENOUS

## 2012-12-17 MED ORDER — WARFARIN SODIUM 3 MG PO TABS
3.0000 mg | ORAL_TABLET | ORAL | Status: DC
  Administered 2012-12-17 – 2012-12-18 (×2): 3 mg via ORAL
  Filled 2012-12-17 (×3): qty 1

## 2012-12-17 MED ORDER — LIDOCAINE HCL (CARDIAC) 20 MG/ML IV SOLN
INTRAVENOUS | Status: DC | PRN
  Administered 2012-12-17 (×2): 50 mg via INTRAVENOUS

## 2012-12-17 MED ORDER — EPHEDRINE SULFATE 50 MG/ML IJ SOLN
INTRAMUSCULAR | Status: DC | PRN
  Administered 2012-12-17: 10 mg via INTRAVENOUS

## 2012-12-17 MED ORDER — VANCOMYCIN HCL 1000 MG IV SOLR
1000.0000 mg | Freq: Two times a day (BID) | INTRAVENOUS | Status: DC
  Administered 2012-12-17: 1000 mg via INTRAVENOUS
  Filled 2012-12-17 (×2): qty 1000

## 2012-12-17 MED ORDER — WARFARIN SODIUM 3 MG PO TABS: 3.0000 mg | ORAL_TABLET | Freq: Every day | ORAL | Status: DC

## 2012-12-17 MED ORDER — LIDOCAINE HCL (PF) 1 % IJ SOLN
INTRAMUSCULAR | Status: AC
  Filled 2012-12-17: qty 30

## 2012-12-17 MED ORDER — NEOSTIGMINE METHYLSULFATE 1 MG/ML IJ SOLN
INTRAMUSCULAR | Status: DC | PRN
  Administered 2012-12-17: 4 mg via INTRAVENOUS

## 2012-12-17 MED ORDER — ONDANSETRON HCL 4 MG/2ML IJ SOLN: 4.0000 mg | Freq: Four times a day (QID) | INTRAMUSCULAR | Status: DC | PRN

## 2012-12-17 MED ORDER — VANCOMYCIN HCL IN DEXTROSE 1-5 GM/200ML-% IV SOLN
1000.0000 mg | Freq: Two times a day (BID) | INTRAVENOUS | Status: AC
  Administered 2012-12-17: 1000 mg via INTRAVENOUS
  Filled 2012-12-17: qty 200

## 2012-12-17 MED ORDER — CARVEDILOL 6.25 MG PO TABS
6.2500 mg | ORAL_TABLET | Freq: Two times a day (BID) | ORAL | Status: DC
  Administered 2012-12-18 – 2012-12-20 (×4): 6.25 mg via ORAL
  Filled 2012-12-17 (×8): qty 1

## 2012-12-17 MED ORDER — FUROSEMIDE 20 MG PO TABS
20.0000 mg | ORAL_TABLET | Freq: Every day | ORAL | Status: DC
  Administered 2012-12-17 – 2012-12-18 (×2): 20 mg via ORAL
  Filled 2012-12-17 (×3): qty 1

## 2012-12-17 MED ORDER — INFLUENZA VAC SPLIT QUAD 0.5 ML IM SUSP
0.5000 mL | INTRAMUSCULAR | Status: AC
  Filled 2012-12-17: qty 0.5

## 2012-12-17 MED ORDER — POTASSIUM CHLORIDE CRYS ER 20 MEQ PO TBCR
20.0000 meq | EXTENDED_RELEASE_TABLET | Freq: Every day | ORAL | Status: DC
  Administered 2012-12-17 – 2012-12-18 (×2): 20 meq via ORAL
  Filled 2012-12-17 (×3): qty 1

## 2012-12-17 MED ORDER — ASPIRIN EC 81 MG PO TBEC
81.0000 mg | DELAYED_RELEASE_TABLET | Freq: Every day | ORAL | Status: DC
  Administered 2012-12-18 – 2012-12-19 (×2): 81 mg via ORAL
  Filled 2012-12-17 (×3): qty 1

## 2012-12-17 MED ORDER — HYDROCODONE-ACETAMINOPHEN 7.5-325 MG PO TABS
1.0000 | ORAL_TABLET | Freq: Three times a day (TID) | ORAL | Status: DC | PRN
  Administered 2012-12-17 – 2012-12-19 (×5): 1 via ORAL
  Filled 2012-12-17 (×5): qty 1

## 2012-12-17 MED ORDER — LACTATED RINGERS IV SOLN
INTRAVENOUS | Status: DC | PRN
  Administered 2012-12-17 (×2): via INTRAVENOUS

## 2012-12-17 MED ORDER — FENTANYL CITRATE 0.05 MG/ML IJ SOLN
INTRAMUSCULAR | Status: DC | PRN
  Administered 2012-12-17: 150 ug via INTRAVENOUS
  Administered 2012-12-17: 50 ug via INTRAVENOUS

## 2012-12-17 MED ORDER — SODIUM CHLORIDE 0.9 % IR SOLN
Status: DC
  Filled 2012-12-17: qty 2

## 2012-12-17 MED ORDER — PHENYLEPHRINE HCL 10 MG/ML IJ SOLN
INTRAMUSCULAR | Status: DC | PRN
  Administered 2012-12-17: 80 ug via INTRAVENOUS

## 2012-12-17 MED ORDER — ACETAMINOPHEN 325 MG PO TABS
325.0000 mg | ORAL_TABLET | ORAL | Status: DC | PRN
  Administered 2012-12-18: 650 mg via ORAL
  Filled 2012-12-17: qty 2

## 2012-12-17 MED ORDER — VANCOMYCIN HCL IN DEXTROSE 1-5 GM/200ML-% IV SOLN
1000.0000 mg | INTRAVENOUS | Status: DC
  Filled 2012-12-17: qty 200

## 2012-12-17 MED ORDER — PHENYLEPHRINE HCL 10 MG/ML IJ SOLN
10.0000 mg | INTRAMUSCULAR | Status: DC | PRN
  Administered 2012-12-17: 25 ug/min via INTRAVENOUS

## 2012-12-17 MED ORDER — NITROGLYCERIN 0.4 MG SL SUBL: 0.4000 mg | SUBLINGUAL_TABLET | SUBLINGUAL | Status: DC | PRN

## 2012-12-17 SURGICAL SUPPLY — 49 items
BAG BANDED W/RUBBER/TAPE 36X54 (MISCELLANEOUS) ×2 IMPLANT
BAG DECANTER FOR FLEXI CONT (MISCELLANEOUS) ×4 IMPLANT
BLADE STERNUM SYSTEM 6 (BLADE) ×2 IMPLANT
BLADE SURG 10 STRL SS (BLADE) ×2 IMPLANT
BLADE SURG ROTATE 9660 (MISCELLANEOUS) ×2 IMPLANT
BNDG COHESIVE 4X5 WHT NS (GAUZE/BANDAGES/DRESSINGS) IMPLANT
CANISTER SUCTION 2500CC (MISCELLANEOUS) ×2 IMPLANT
CLOTH BEACON ORANGE TIMEOUT ST (SAFETY) IMPLANT
COVER TABLE BACK 60X90 (DRAPES) ×2 IMPLANT
DRAPE C-ARM 42X72 X-RAY (DRAPES) IMPLANT
DRAPE CARDIOVASCULAR INCISE (DRAPES) ×2
DRAPE INCISE IOBAN 66X45 STRL (DRAPES) IMPLANT
DRAPE PROXIMA HALF (DRAPES) IMPLANT
DRAPE SRG 135X102X78XABS (DRAPES) ×1 IMPLANT
ELECT REM PT RETURN 9FT ADLT (ELECTROSURGICAL) ×4
ELECTRODE REM PT RTRN 9FT ADLT (ELECTROSURGICAL) ×2 IMPLANT
GAUZE PACKING IODOFORM 1 (PACKING) ×2 IMPLANT
GAUZE SPONGE 4X4 16PLY XRAY LF (GAUZE/BANDAGES/DRESSINGS) IMPLANT
GLOVE BIOGEL PI IND STRL 6.5 (GLOVE) ×2 IMPLANT
GLOVE BIOGEL PI IND STRL 7.0 (GLOVE) ×2 IMPLANT
GLOVE BIOGEL PI IND STRL 7.5 (GLOVE) ×1 IMPLANT
GLOVE BIOGEL PI INDICATOR 6.5 (GLOVE) ×2
GLOVE BIOGEL PI INDICATOR 7.0 (GLOVE) ×2
GLOVE BIOGEL PI INDICATOR 7.5 (GLOVE) ×1
GLOVE ECLIPSE 6.5 STRL STRAW (GLOVE) ×4 IMPLANT
GLOVE ECLIPSE 8.0 STRL XLNG CF (GLOVE) ×2 IMPLANT
GOWN PREVENTION PLUS XLARGE (GOWN DISPOSABLE) ×2 IMPLANT
GOWN STRL NON-REIN LRG LVL3 (GOWN DISPOSABLE) ×4 IMPLANT
KIT ROOM TURNOVER OR (KITS) ×2 IMPLANT
LIDOCAINE 1% PLAIN 30ML IMPLANT
NS IRRIG 1000ML POUR BTL (IV SOLUTION) ×2 IMPLANT
PAD ARMBOARD 7.5X6 YLW CONV (MISCELLANEOUS) ×4 IMPLANT
PAD ELECT DEFIB RADIOL ZOLL (MISCELLANEOUS) ×2 IMPLANT
SPONGE GAUZE 4X4 12PLY (GAUZE/BANDAGES/DRESSINGS) ×2 IMPLANT
STOPCOCK 4 WAY LG BORE MALE ST (IV SETS) ×4 IMPLANT
SUT PROLENE 2 0 CT2 30 (SUTURE) IMPLANT
SUT PROLENE 2 0 SH DA (SUTURE) ×4 IMPLANT
SUT VIC AB 2-0 CT2 18 VCP726D (SUTURE) IMPLANT
SUT VIC AB 3-0 X1 27 (SUTURE) IMPLANT
SYR 20CC LL (SYRINGE) ×2 IMPLANT
TAPE CLOTH SURG 6X10 WHT LF (GAUZE/BANDAGES/DRESSINGS) ×2 IMPLANT
TOWEL OR 17X24 6PK STRL BLUE (TOWEL DISPOSABLE) ×2 IMPLANT
TOWEL OR 17X26 10 PK STRL BLUE (TOWEL DISPOSABLE) ×4 IMPLANT
TRAY CATH LUMEN 1 20CM STRL (SET/KITS/TRAYS/PACK) ×2 IMPLANT
TRAY FOLEY IC TEMP SENS 14FR (CATHETERS) ×2 IMPLANT
TUBE CONNECTING 12X1/4 (SUCTIONS) ×2 IMPLANT
TUBING ART PRESS 72  MALE/FEM (TUBING) ×2
TUBING ART PRESS 72 MALE/FEM (TUBING) ×2 IMPLANT
YANKAUER SUCT BULB TIP NO VENT (SUCTIONS) ×2 IMPLANT

## 2012-12-17 NOTE — CV Procedure (Signed)
ICD system extraction via the left subclavian vein without immediate complication. [id number].

## 2012-12-17 NOTE — Progress Notes (Signed)
Patient is currently active with [facility] Care Management for chronic disease management services.  Patient has been engaged by a [employer].  We have had difficulty maintaining consistent engagement because the patient does not consistently return calls.  Our community based plan of care has focused on disease management of CHF, CAD, HTN, and medication adherence.  Patient will receive a post discharge transition of care call and will be evaluated for monthly home visits for assessments and disease process education.  Made inpatient Case Manager, Tomi Bamberger RN aware that [facility] Care Management following. Of note, [facility] Care Management services does not replace or interfere with any services that are arranged by inpatient case management or social work.  For additional questions or referrals please contact Anibal Henderson BSN RN [facility] Liaison at [phone].

## 2012-12-17 NOTE — H&P (View-Only) (Signed)
ELECTROPHYSIOLOGY OFFICE NOTE  Patient ID: [name] MRN: [id number], DOB/AGE: 1950/08/16   Date of Visit: 12/14/2012  Primary Physician: Sanda Linger, MD Primary Cardiologist / EP: Verdis Prime, MD / Berton Mount, MD Reason for Visit: Wound check  History of Present Illness  [name] is a 62 y.o. female with an ischemic CM s/p ICD implant in 2005, s/p ICD generator change March 2014, PAF, CAD, CKD, HTN and prior CVA who presents today as an add on for wound check. She was evaluated by Dr. Graciela Husbands on 12/11/2012 for ICD pocket retraction and pain, found to have chronic subacute ICD infection. She is awaiting ICD system extraction. Today she called our office to report the site is now draining.    Since last being seen in our clinic, she reports the ICD site is draining clear fluid. She denies purulent drainage. She denies fever or chills. She denies chest pain or shortness of breath. She denies palpitations, dizziness, near syncope or syncope. She denies LE swelling, orthopnea, PND or recent weight gain. She denies ICD shocks.  Past Medical History Past Medical History  Diagnosis Date  . Spinal stenosis   . Numbness of foot     left foot  . Diverticulitis     Status post partial colectomy 1/12 with reversal in July 2012  . Colitis, ischemic   . DJD (degenerative joint disease)     History of multiple surgeries to the back, shoulder and knee  . Ischemic cardiomyopathy     Echo 06/16/10: EF 25-30%, anteroseptal and apical hypokinesis, moderate AI, mild MR  . Chronic systolic heart failure   . CAD (coronary artery disease)     a.  Ant MI 8/03 with stenting of the LAD;  b. staged PCI with Cypher DES to OM1 8/03;  c. s/p Cypher DES to LAD 7/04;  d.  multiple cardiac caths in past (chronically abnl ECG);    e. cardiac catheterization 3/12: LAD stent patent, distal LAD 40-50%, small ostial D1 80%, ostial D2 60%, OM-1 stent patent (20-30%), proximal RCA 50%, proximal to mid RCA 40-50%;  f. cath 02/17/2011 - nonobs  . LV (left ventricular) mural thrombus     Chronic Coumadin therapy  . CKD (chronic kidney disease), stage III     creatinine:  1.3 in 4/12;    . Tobacco abuse     history  . GERD (gastroesophageal reflux disease)   . Hypertension   . Lower GI bleed     August 2011  . Hypothyroidism     TSH 0.19 3/14  . Aortic insufficiency   . Pseudoaneurysm     History of, right forearm  . Hyperlipidemia   . Compression fracture 07/04/11    L2  . 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 Dual implantable cardioverter-defibrillator BSx     Implant 2005, gen change 2005,14  . PAF (paroxysmal atrial fibrillation)   . Anatomical narrow angle of right eye   . Blood transfusion   . Anemia   . H/O hiatal hernia   . Stroke     History of TIA and possible CVA; hospitalized 2004; on coumadin  . Sinus bradycardia 03/05/2012  . Myocardial infarction   . CHF (congestive heart failure)   . Automatic implantable cardioverter-defibrillator in situ     Past Surgical History Past Surgical History  Procedure Laterality Date  . Back surgery    .  Coronary angioplasty with stent placement  10/2001; 09/2002  . Cardiac defibrillator placement  07/2003; 10/2004  . Lumbar laminectomy/decompression microdiscectomy  10/2001    L5-S1/E-chart  . Knee arthroscopy      left  . Thyroidectomy  1970's  . Total knee arthroplasty  11/2003    left  . X-stop implantation  12/2004    L3-4; L4-5  . Fixation kyphoplasty thoracic spine  07/2007    T3, 4, 6 compression fractures  . Shoulder open rotator cuff repair  04/2008    right  . Lumbar laminectomy/decompression microdiscectomy  01/2010  . Colectomy  03/2010    sigmoid left  . Colostomy  03/2010    transverse  . Peripherally inserted central catheter insertion  03/2010    removed upon discharge  . Colostomy closure  12/2010     reversal  . Tonsillectomy and adenoidectomy  1969  . Appendectomy  03/2010  . Cataract extraction      right    Allergies/Intolerances Allergies  Allergen Reactions  . Lansoprazole Nausea Only  . Penicillins Swelling    Throat swells  . Statins Other (See Comments)    cramps  . Lisinopril     cough  . Lactose Intolerance (Gi) Diarrhea and Other (See Comments)    Patient reports abdominal cramping and diarrhea with lactose products.     Current Home Medications Current Outpatient Prescriptions  Medication Sig Dispense Refill  . aspirin EC 81 MG tablet Take 81 mg by mouth daily.      [name] ezetimibe (ZETIA) 10 MG tablet Take 10 mg by mouth at bedtime.       . fexofenadine (ALLEGRA) 180 MG tablet Take 180 mg by mouth daily.       . fluticasone (FLONASE) 50 MCG/ACT nasal spray Place 2 sprays into the nose daily as needed for allergies.       . folic acid-pyridoxine-cyancobalamin (FOLTX) 2.5-25-2 MG TABS Take 1 tablet by mouth daily.       . furosemide (LASIX) 20 MG tablet Take 20 mg by mouth daily.      [name] HYDROcodone-acetaminophen (NORCO) 7.5-325 MG per tablet Take 1 tablet by mouth every 8 (eight) hours as needed for pain.      [name] levothyroxine (SYNTHROID, LEVOTHROID) 200 MCG tablet Take 200 mcg by mouth daily.      . methocarbamol (ROBAXIN) 500 MG tablet Take 500 mg by mouth 4 (four) times daily as needed (for spasms).       . nitroGLYCERIN (NITROSTAT) 0.4 MG SL tablet Place 0.4 mg under the tongue every 5 (five) minutes as needed for chest pain.       [name] omega-3 acid ethyl esters (LOVAZA) 1 G capsule Take 1 g by mouth daily.      . pantoprazole (PROTONIX) 40 MG tablet Take 40 mg by mouth 2 (two) times daily.       . polyethylene glycol (MIRALAX / GLYCOLAX) packet Take 17 g by mouth daily as needed (for constipation).       . potassium chloride SA (K-DUR,KLOR-CON) 20 MEQ tablet Take 20 mEq by mouth daily.      . pregabalin (LYRICA) 50 MG capsule Take 50 mg by mouth 3 (three) times daily.       . raloxifene (EVISTA) 60 MG tablet Take 60 mg by mouth daily.      [name] spironolactone (ALDACTONE) 25 MG tablet Take 25 mg by mouth daily.      [name] warfarin (COUMADIN) 3 MG tablet Take  3-4.5 mg by mouth daily. Takes 3mg  every day, except on Wednesday and Friday takes 4.5mg       . carvedilol (COREG) 6.25 MG tablet Take 6.25 mg by mouth 2 (two) times daily with a meal.       Physical Exam Vitals: Blood pressure 141/78, pulse 76, temperature 98.4 F (36.9 C), height 5\' 6"  (1.676 m), weight 142 lb 4 oz (64.524 kg).  General: Well developed, well appearing 62 y.o. female in no acute distress. Skin: Left upper chest / implant site is mildly erythematous and retracted on the left lateral edge with a small area of eschar. Tender to palpation. No purulent drainage or warmth.   Assessment and Plan 1. ICD pocket retraction / erosion, chronic subacute ICD infection As outlined in Dr. Odessa Fleming note from 12/11/2012, Ms. Wilinski needs to have the entire ICD system extracted. This is scheduled on Monday, 12/17/2012. Today we dressed the site with clean 4x4 gauze and Tegaderm bandage without difficulty. She was instructed not to get the site wet. She was also instructed to hold Coumadin in preparation for the procedure.   Limmie Patricia, PA-C 12/16/2012, 11:23 PM

## 2012-12-17 NOTE — Transfer of Care (Signed)
Immediate Anesthesia Transfer of Care Note  Patient: [name]  Procedure(s) Performed: Procedure(s): ICD LEAD REMOVAL (Left)  Patient Location: PACU  Anesthesia Type:General  Level of Consciousness: awake, alert  and oriented  Airway & Oxygen Therapy: Patient Spontanous Breathing and Patient connected to nasal cannula oxygen  Post-op Assessment: Report given to PACU RN and Post -op Vital signs reviewed and stable  Post vital signs: Reviewed and stable  Complications: No apparent anesthesia complications

## 2012-12-17 NOTE — OR Nursing (Signed)
Femoral venous line placed per Dr. Ladona Ridgel into right groin. Start at 0830; end at [phone].

## 2012-12-17 NOTE — Preoperative (Signed)
Beta Blockers   Reason not to administer Beta Blockers:received coreg today 

## 2012-12-17 NOTE — Anesthesia Postprocedure Evaluation (Signed)
  Anesthesia Post-op Note  Patient: [name]  Procedure(s) Performed: Procedure(s): ICD LEAD REMOVAL (Left)  Patient Location: PACU  Anesthesia Type:General  Level of Consciousness: awake  Airway and Oxygen Therapy: Patient Spontanous Breathing  Post-op Pain: mild  Post-op Assessment: Post-op Vital signs reviewed  Post-op Vital Signs: Reviewed  Complications: No apparent anesthesia complications

## 2012-12-17 NOTE — Anesthesia Preprocedure Evaluation (Addendum)
Anesthesia Evaluation  Patient identified by MRN, date of birth, ID band Patient awake    Reviewed: Allergy & Precautions, H&P , NPO status , Patient's Chart, lab work & pertinent test results  Airway Mallampati: II      Dental   Pulmonary neg pulmonary ROS,  breath sounds clear to auscultation        Cardiovascular hypertension, + CAD, + Past MI, + Peripheral Vascular Disease and +CHF + dysrhythmias + Cardiac Defibrillator Rhythm:Regular Rate:Normal     Neuro/Psych    GI/Hepatic Neg liver ROS, hiatal hernia, GERD-  ,  Endo/Other  Hypothyroidism   Renal/GU Renal disease     Musculoskeletal   Abdominal   Peds  Hematology   Anesthesia Other Findings   Reproductive/Obstetrics                         Anesthesia Physical Anesthesia Plan  ASA: IV  Anesthesia Plan: General   Post-op Pain Management:    Induction: Intravenous  Airway Management Planned: Oral ETT  Additional Equipment: Arterial line  Intra-op Plan:   Post-operative Plan: Possible Post-op intubation/ventilation  Informed Consent: I have reviewed the patients History and Physical, chart, labs and discussed the procedure including the risks, benefits and alternatives for the proposed anesthesia with the patient or authorized representative who has indicated his/her understanding and acceptance.   Dental advisory given  Plan Discussed with: CRNA, Anesthesiologist and Surgeon  Anesthesia Plan Comments:         Anesthesia Quick Evaluation

## 2012-12-17 NOTE — Progress Notes (Signed)
Aline d/c,d with cath intact pressure applied x41minutes pressure bandage applied no signs of hematoma notes

## 2012-12-17 NOTE — Interval H&P Note (Signed)
History and Physical Interval Note: The patient is well known to me. I saw her with Dr. Graciela Husbands on 09/23 when she presented with threatened pocket erosion. In the interim, she has developed pocket erosion with her device draining. She has had no fever or chills though she does not feel well. Drainage was not purulent. She presents today for ICD system extraction. I have carefully outlined the risks to the patient which include but are not limited to bleeding, clotting, infection, stroke, and death. She understands that her ICD system pocket infection cannot be cured with medications alone. She wishes to proceed with ICD system removal.  12/17/2012 7:00 AM  [name]  has presented today for surgery, with the diagnosis of ICD INFECTION  The various methods of treatment have been discussed with the patient and family. After consideration of risks, benefits and other options for treatment, the patient has consented to  Procedure(s): ICD LEAD REMOVAL (Left) as a surgical intervention .  The patient's history has been reviewed, patient examined, no change in status, stable for surgery.  I have reviewed the patient's chart and labs.  Questions were answered to the patient's satisfaction.     [name].D.

## 2012-12-17 NOTE — OR Nursing (Addendum)
Sheath to right groin pulled at 0945. Pressure held for 9 minutes; band-aid applied at 0954.  No swelling or redness noted at site.

## 2012-12-17 NOTE — Progress Notes (Signed)
Patient reports having money and cell phone in pocket book. Patient notified that [facility] could not be responsible for belongings. Offered to send same to security. Patient refused. Will send this along with glasses and clothing to PACU. Did not inventory money or view cell phone. Patient verbalized that it was a small amount of money and that she understood that [facility] was not responsible for loss or theft.

## 2012-12-18 ENCOUNTER — Encounter: Payer: Medicare Other | Admitting: Cardiology

## 2012-12-18 ENCOUNTER — Inpatient Hospital Stay (HOSPITAL_COMMUNITY): Payer: Medicare Other

## 2012-12-18 ENCOUNTER — Encounter (HOSPITAL_COMMUNITY): Payer: Self-pay | Admitting: Internal Medicine

## 2012-12-18 DIAGNOSIS — T827XXA Infection and inflammatory reaction due to other cardiac and vascular devices, implants and grafts, initial encounter: Principal | ICD-10-CM

## 2012-12-18 LAB — PROTIME-INR: INR: 1.09 (ref 0.00–1.49)

## 2012-12-18 MED ORDER — VANCOMYCIN HCL IN DEXTROSE 1-5 GM/200ML-% IV SOLN
1000.0000 mg | Freq: Two times a day (BID) | INTRAVENOUS | Status: AC
  Administered 2012-12-18 (×2): 1000 mg via INTRAVENOUS
  Filled 2012-12-18 (×3): qty 200

## 2012-12-18 MED ORDER — MENTHOL 3 MG MT LOZG
1.0000 | LOZENGE | OROMUCOSAL | Status: DC | PRN
  Administered 2012-12-18: 3 mg via ORAL
  Filled 2012-12-18 (×2): qty 9

## 2012-12-18 NOTE — Op Note (Signed)
NAMESECILY, WALTHOUR NO.:  [id number]  MEDICAL RECORD NO.:  [id number]  LOCATION:  [id number]                        FACILITY:  [facility]  PHYSICIAN:  Doylene Canning. Ladona Ridgel, MD    DATE OF BIRTH:  Oct 31, 1950  DATE OF PROCEDURE:  12/17/2012 DATE OF DISCHARGE:                              OPERATIVE REPORT   PROCEDURE PERFORMED:  Extraction of a dual-chamber ICD system.  INDICATION:  ICD systemic infection, unknown.  INTRODUCTION:  The patient is a 62 year old woman with a history of ischemic cardiomyopathy status post MI, chronic systolic heart failure, status post ICD implantation initially in 2005.  The patient developed elective replacement, and an atrial lead was placed under generator change out several months ago.  Over the last several weeks, she has had thinning of her ICD incision followed by drainage of the pocket.  She has had no fevers and chills, but because her device has come through the skin, now referred for extraction of entire system.  PROCEDURE IN DETAIL:  After informed consent was obtained, the patient was taken to the operating room in a fasting state.  After usual preparation and draping, the anesthesia service was utilized to provide general anesthesia.  A 6-French sheath was placed into the right femoral vein.  A 7-cm incision was carried out over the old ICD insertion site and the generator was removed with gentle traction.  It was disconnected from the leads.  The leads were freed up from their dense fibrous adhesions with electrocautery.  The 52 cm Saint Jude atrial pacing lead had a stylet advanced into and the helix of the lead was retracted. Gentle traction was then placed on the lead and it was removed in toto. In the same way, a sheath was advanced into the defibrillator lead and the helix retracted back into the body of the lead.  The defibrillator lead was fixed and stuck.  The lead was cut.  The [employer] locking stylet was advanced  into the lead and locked in place.  The [employer] 11-French mini mechanical dissection sheath was advanced over the lead and into the subclavian vein without particular difficulty.  The [employer] 11-French sheath was removed and [employer] long 11-French mechanical extraction sheath was advanced over the lead and utilizing a combination of counter pressure traction and counter traction, the lead was removed in toto.  There were no hemodynamic sequelae.  Pressure was held over the ICD pocket, and hemostasis was assured.  The pocket was debrided of its dense fibrous scar tissue and infected tissue.  Electrocautery was utilized to assure hemostasis.  The pocket was irrigated with antibiotic irrigation.  The incision was closed with 2-0 Prolene mattress suture.  The pocket was packed with iodoform gauze.  A pressure dressing was placed over the pocket and the patient was returned to the recovery area in satisfactory condition.  COMPLICATIONS:  There were no immediate procedure complications.  RESULTS:  This demonstrates successful ICD system extraction in the patient with ICD system infection.     Doylene Canning. Ladona Ridgel, MD     GWT/MEDQ  D:  12/17/2012  T:  12/18/2012  Job:  [id number]  cc:   Duke Salvia,  MD, [facility] Hillis Range, MD

## 2012-12-18 NOTE — Progress Notes (Signed)
Ambulated pt in hallway 200 ft; pt tolerated well

## 2012-12-18 NOTE — Progress Notes (Signed)
Patient ID: [name], female   DOB: 1950-05-14, 62 y.o.   MRN: [id number] Subjective:  No chest pain or sob. Minimal incisional pain  Objective:  Vital Signs in the last 24 hours: Temp:  [97 F (36.1 C)-100 F (37.8 C)] 100 F (37.8 C) (09/30 0510) Pulse Rate:  [49-86] 75 (09/30 0800) Resp:  [10-18] 18 (09/30 0510) BP: (72-121)/(39-84) 108/65 mmHg (09/30 0800) SpO2:  [97 %-100 %] 98 % (09/30 0510) Arterial Line BP: (95-103)/(40-48) 96/40 mmHg (09/29 1100)  Intake/Output from previous day: 09/29 0701 - 09/30 0700 In: 700 [I.V.:700] Out: -  Intake/Output from this shift:    Physical Exam: Well appearing 62 yo woman, NAD HEENT: Unremarkable Neck:  No JVD, no thyromegall Back:  No CVA tenderness Lungs:  Clear with no wheezes, rales, or rhonchi. No obvious bleeding. Bandage dry.  HEART:  Regular rate rhythm, no murmurs, no rubs, no clicks Abd:  Flat, positive bowel sounds, no organomegally, no rebound, no guarding Ext:  2 plus pulses, no edema, no cyanosis, no clubbing Skin:  No rashes no nodules Neuro:  CN II through XII intact, motor grossly intact  Lab Results:  Recent Labs  12/17/12 0617 12/17/12 1540  WBC 3.4* 4.4  HGB 13.5 11.6*  PLT 171 140*    Recent Labs  12/17/12 0617 12/17/12 1540  NA 137  --   K 4.2  --   CL 102  --   CO2 21  --   GLUCOSE 86  --   BUN 13  --   CREATININE 0.98 0.96   No results found for this basename: TROPONINI, CK, MB,  in the last 72 hours Hepatic Function Panel No results found for this basename: PROT, ALBUMIN, AST, ALT, ALKPHOS, BILITOT, BILIDIR, IBILI,  in the last 72 hours No results found for this basename: CHOL,  in the last 72 hours No results found for this basename: PROTIME,  in the last 72 hours  Imaging: Dg Chest 2 View  12/18/2012   CLINICAL DATA:  Defibrillator removal.  EXAM: CHEST  2 VIEW  COMPARISON:  06/08/2012  FINDINGS: Interval removal of the left-sided defibrillator. Soft tissue gas in the anterior  chest wall at the battery site. No pneumothorax. Bibasilar atelectasis. Heart is normal size.  IMPRESSION: No pneumothorax. Bibasilar atelectasis.   Electronically Signed   By: Charlett Nose M.D.   On: 12/18/2012 06:39    Cardiac Studies: Tele - nsr Assessment/Plan:   LOS: 1 day  1. ICD system infection 2. S/p extraction Rec: will ambulate and observe on tele today with plans to discharge tomorrow with a Life Vest. I will defer decision on re-implant to Dr. Graciela Husbands. She will take 5 days of clindamycin.    Rmani Kapusta,M.D. 12/18/2012, 8:14 AM

## 2012-12-18 NOTE — Progress Notes (Addendum)
Foley d/c per protocol; pt tolerated well, pt reminded to let RN know when she has voided for the first time

## 2012-12-19 ENCOUNTER — Encounter: Payer: Self-pay | Admitting: Internal Medicine

## 2012-12-19 DIAGNOSIS — I2589 Other forms of chronic ischemic heart disease: Secondary | ICD-10-CM

## 2012-12-19 LAB — BASIC METABOLIC PANEL
BUN: 17 mg/dL (ref 6–23)
CO2: 23 mEq/L (ref 19–32)
Calcium: 7.8 mg/dL — ABNORMAL LOW (ref 8.4–10.5)
Chloride: 95 mEq/L — ABNORMAL LOW (ref 96–112)
GFR calc Af Amer: 49 mL/min — ABNORMAL LOW (ref >90)
GFR calc non Af Amer: 42 mL/min — ABNORMAL LOW (ref >90)
Potassium: 4.4 mEq/L (ref 3.5–5.1)
Sodium: 129 mEq/L — ABNORMAL LOW (ref 135–145)

## 2012-12-19 LAB — CBC
HCT: 33.9 % — ABNORMAL LOW (ref 36.0–46.0)
Hemoglobin: 11.8 g/dL — ABNORMAL LOW (ref 12.0–15.0)
MCH: 34.9 pg — ABNORMAL HIGH (ref 26.0–34.0)
MCHC: 34.8 g/dL (ref 30.0–36.0)
MCV: 100.3 fL — ABNORMAL HIGH (ref 78.0–100.0)
Platelets: 127 K/uL — ABNORMAL LOW (ref 150–400)
RBC: 3.38 MIL/uL — ABNORMAL LOW (ref 3.87–5.11)
RDW: 15.8 % — ABNORMAL HIGH (ref 11.5–15.5)
WBC: 7.8 K/uL (ref 4.0–10.5)

## 2012-12-19 LAB — PROTIME-INR
INR: 1.23 (ref 0.00–1.49)
Prothrombin Time: 15.2 s (ref 11.6–15.2)

## 2012-12-19 NOTE — Progress Notes (Signed)
Patient: [name] Date of Encounter: 12/19/2012, 6:53 AM Admit date: 12/17/2012     Subjective  [name] is sore at explant site otherwise she has no complaints. She denies CP or SOB. She had fever 102 overnight.   Objective  Physical Exam: Vitals: BP 108/47  Pulse 81  Temp(Src) 98.4 F (36.9 C) (Oral)  Resp 18  Ht 5\' 6"  (1.676 m)  Wt 140 lb (63.504 kg)  BMI 22.61 kg/m2  SpO2 91% General: Well developed, well appearing 62 yo female in no acute distress. Neck: Supple. JVD not elevated. Lungs: Clear bilaterally to auscultation without wheezes, rales, or rhonchi. Breathing is unlabored. Heart: RRR S1 S2 without murmurs, rubs, or gallops.  Abdomen: Soft, non-distended. Extremities: No clubbing or cyanosis. No edema.  Distal pedal pulses are 2+ and equal bilaterally. Neuro: Alert and oriented X 3. Moves all extremities spontaneously. No focal deficits. Skin: Left upper chest / explant site intact with moderate serosanguinous drainage and moderate erythema.   Intake/Output:  Intake/Output Summary (Last 24 hours) at 12/19/12 0653 Last data filed at 12/18/12 1300  Gross per 24 hour  Intake    840 ml  Output      0 ml  Net    840 ml    Inpatient Medications:  . aspirin EC  81 mg Oral Daily  . carvedilol  6.25 mg Oral BID WC  . ezetimibe  10 mg Oral QHS  . folic acid-pyridoxine-cyancobalamin  1 tablet Oral Daily  . furosemide  20 mg Oral Daily  . heparin subcutaneous  5,000 Units Subcutaneous Q8H  . influenza vac split quadrivalent PF  0.5 mL Intramuscular Tomorrow-1000  . levothyroxine  200 mcg Oral QAC breakfast  . loratadine  10 mg Oral Daily  . omega-3 acid ethyl esters  1 g Oral Daily  . pantoprazole  40 mg Oral BID AC  . potassium chloride SA  20 mEq Oral Daily  . pregabalin  50 mg Oral TID  . raloxifene  60 mg Oral Daily  . spironolactone  25 mg Oral Daily  . warfarin  3 mg Oral Custom  . warfarin  4.5 mg Oral Custom  . Warfarin - Physician Dosing  Inpatient   Does not apply q1800    Labs:  Recent Labs  12/17/12 0617 12/17/12 1540 12/19/12 0450  NA 137  --  129*  K 4.2  --  4.4  CL 102  --  95*  CO2 21  --  23  GLUCOSE 86  --  124*  BUN 13  --  17  CREATININE 0.98 0.96 1.32*  CALCIUM 8.9  --  7.8*    Recent Labs  12/17/12 0617 12/17/12 1540  WBC 3.4* 4.4  HGB 13.5 11.6*  HCT 37.7 33.8*  MCV 99.7 102.1*  PLT 171 140*    Recent Labs  12/18/12 0510  INR 1.09    Radiology/Studies: Dg Chest 2 View  12/18/2012   CLINICAL DATA:  Defibrillator removal.  EXAM: CHEST  2 VIEW  COMPARISON:  06/08/2012  FINDINGS: Interval removal of the left-sided defibrillator. Soft tissue gas in the anterior chest wall at the battery site. No pneumothorax. Bibasilar atelectasis. Heart is normal size.  IMPRESSION: No pneumothorax. Bibasilar atelectasis.   Electronically Signed   By: Charlett Nose M.D.   On: 12/18/2012 06:39   Telemetry: SR with occasional PVCs; no arrhythmias    Assessment and Plan  1. ICD pocket erosion and subacute infection 2. Ischemic CM  with chronic systolic HF 3. PAF 4. CAD 5. AKI on CKD 6. Hyponatremia  [name] is doing well s/p ICD system extraction, post op day 2. Serum Cr elevated but BUN normal, sodium low. ? if worsening renal function related to vancomycin which is now complete. Will hold Lasix today and repeat BMET in AM. Stop ASA and heparin. Place TED hose. Packing removed from wound. Pressure dressing applied. Ambulate. Probably home tomorrow. Dr. Ladona Ridgel to see.  Signed, Exie Parody  EP Attending  Patient seen and examined. Agree with above. High had planned to send her home today but with fever, worsening renal function and hyponatremia, would watch an additional day.  [name]

## 2012-12-20 LAB — BASIC METABOLIC PANEL
CO2: 26 mEq/L (ref 19–32)
Calcium: 8 mg/dL — ABNORMAL LOW (ref 8.4–10.5)
Chloride: 103 mEq/L (ref 96–112)
Creatinine, Ser: 1.14 mg/dL — ABNORMAL HIGH (ref 0.50–1.10)
Glucose, Bld: 88 mg/dL (ref 70–99)
Sodium: 138 mEq/L (ref 135–145)

## 2012-12-20 LAB — CBC
Hemoglobin: 11.3 g/dL — ABNORMAL LOW (ref 12.0–15.0)
MCH: 35.9 pg — ABNORMAL HIGH (ref 26.0–34.0)
MCHC: 35.3 g/dL (ref 30.0–36.0)
MCV: 101.6 fL — ABNORMAL HIGH (ref 78.0–100.0)
Platelets: 120 10*3/uL — ABNORMAL LOW (ref 150–400)
RBC: 3.15 MIL/uL — ABNORMAL LOW (ref 3.87–5.11)
WBC: 5.5 10*3/uL (ref 4.0–10.5)

## 2012-12-20 MED ORDER — WARFARIN SODIUM 3 MG PO TABS: 3.0000 mg | ORAL_TABLET | Freq: Every day | ORAL | Status: DC

## 2012-12-20 NOTE — Progress Notes (Signed)
     Patient: QUANTA ROBERTSHAW Date of Encounter: 12/20/2012, 7:54 AM Admit date: 12/17/2012     Subjective  Ms. Eley is sore at explant site otherwise she has no complaints. She denies CP or SOB.    Objective  Physical Exam: Vitals: BP 106/40  Pulse 62  Temp(Src) 98.4 F (36.9 C) (Oral)  Resp 18  Ht 5\' 6"  (1.676 m)  Wt 143 lb 12.8 oz (65.227 kg)  BMI 23.22 kg/m2  SpO2 100% General: Well developed, well appearing 62 yo female in no acute distress. Neck: Supple. JVD not elevated. Lungs: Clear bilaterally to auscultation without wheezes, rales, or rhonchi. Breathing is unlabored. Heart: RRR S1 S2 without murmurs, rubs, or gallops.  Abdomen: Soft, non-distended. Extremities: No clubbing or cyanosis. No edema.  Distal pedal pulses are 2+ and equal bilaterally. Neuro: Alert and oriented X 3. Moves all extremities spontaneously. No focal deficits. Skin: Left upper chest / explant site intact with pressure dressing in place. No bleeding or hematoma.   Intake/Output: No intake or output data in the 24 hours ending 12/20/12 0754  Inpatient Medications:  . carvedilol  6.25 mg Oral BID WC  . ezetimibe  10 mg Oral QHS  . folic acid-pyridoxine-cyancobalamin  1 tablet Oral Daily  . levothyroxine  200 mcg Oral QAC breakfast  . loratadine  10 mg Oral Daily  . omega-3 acid ethyl esters  1 g Oral Daily  . pantoprazole  40 mg Oral BID AC  . pregabalin  50 mg Oral TID  . raloxifene  60 mg Oral Daily  . spironolactone  25 mg Oral Daily  . warfarin  3 mg Oral Custom  . warfarin  4.5 mg Oral Custom  . Warfarin - Physician Dosing Inpatient   Does not apply q1800    Labs:  Recent Labs  12/19/12 0450 12/20/12 0608  NA 129* 138  K 4.4 3.8  CL 95* 103  CO2 23 26  GLUCOSE 124* 88  BUN 17 16  CREATININE 1.32* 1.14*  CALCIUM 7.8* 8.0*    Recent Labs  12/19/12 0655 12/20/12 0608  WBC 7.8 5.5  HGB 11.8* 11.3*  HCT 33.9* 32.0*  MCV 100.3* 101.6*  PLT 127* 120*    Recent Labs  12/20/12 0608  INR 1.20    Radiology/Studies: Dg Chest 2 View  12/18/2012   CLINICAL DATA:  Defibrillator removal.  EXAM: CHEST  2 VIEW  COMPARISON:  06/08/2012  FINDINGS: Interval removal of the left-sided defibrillator. Soft tissue gas in the anterior chest wall at the battery site. No pneumothorax. Bibasilar atelectasis. Heart is normal size.  IMPRESSION: No pneumothorax. Bibasilar atelectasis.   Electronically Signed   By: Charlett Nose M.D.   On: 12/18/2012 06:39   Telemetry: SR with occasional PVCs; no arrhythmias    Assessment and Plan  1. ICD pocket erosion and subacute infection 2. Ischemic CM with chronic systolic HF 3. PAF 4. CAD 5. AKI on CKD 6. Hyponatremia, resolved  Ms. Nuzzo is doing well s/p ICD system extraction, post op day 3. Serum Cr and sodium improved. Will arrange LifeVest placement today. Probably home after LifeVest today. Wound check tomorrow in office.   Signed, Exie Parody  EP Attending  Patient seen and examined. Agree with above. Will dishcarge home today with life vest. She will see Dr. Graciela Husbands back about re-implant of her ICD.  [name]

## 2012-12-20 NOTE — Discharge Summary (Signed)
ELECTROPHYSIOLOGY DISCHARGE SUMMARY    Patient ID: [name],  MRN: [id number], DOB/AGE: 63-Mar-1952 63 y.o.  Admit date: 12/17/2012 Discharge date: 12/20/2012  Primary Care Physician: Sanda Linger, MD Primary Cardiologist / EP: Verdis Prime, MD / Berton Mount, MD  Primary Discharge Diagnosis:  1. ICD pocket erosion and subacute infection  2. AKI on CKD, improving  3. Hyponatremia, resolved  Secondary Discharge Diagnoses:  1. ICD pocket erosion and subacute infection  2. Ischemic CM with chronic systolic HF  3. PAF  4. CAD  5. AKI on CKD  6. Hyponatremia, resolved  Procedures This Admission:  1. ICD system extraction via the left subclavian vein without immediate complication 12/17/2012  History and Hospital Course:  [name] is a 62 y.o. woman with an ischemic CM s/p ICD implant in 2005, s/p ICD generator change March 2014, PAF, CAD, CKD, HTN and prior CVA who was evaluated by Dr. Graciela Husbands on 12/11/2012 for ICD pocket retraction and pain, found to have chronic subacute ICD infection. She presented 12/17/2012 for ICD system extraction. [name] tolerated this procedure well without any immediate complication. She was admitted for observation. She is now post-op day 3. She remains hemodynamically stable and afebrile. Her explant site is intact without significant bleeding or hematoma. Pressure dressing in place. She has been given discharge instructions including wound care and activity restrictions. She will follow-up tomorrow for wound check and removal of pressure dressing. She will then return for repeat wound check on 12/26/2012. She has been seen, examined and deemed stable for discharge today by Dr. Lewayne Bunting.   Discharge Vitals: Blood pressure 104/60, pulse 62, temperature 98.4 F (36.9 C), temperature source Oral, resp. rate 18, height 5\' 6"  (1.676 m), weight 143 lb 12.8 oz (65.227 kg), SpO2 100.00%.   Labs: Lab Results  Component Value Date   WBC 5.5 12/20/2012   HGB 11.3* 12/20/2012   HCT 32.0* 12/20/2012   MCV 101.6* 12/20/2012   PLT 120* 12/20/2012     Recent Labs Lab 12/20/12 0608  NA 138  K 3.8  CL 103  CO2 26  BUN 16  CREATININE 1.14*  CALCIUM 8.0*  GLUCOSE 88    Recent Labs  12/20/12 0608  INR 1.20    Disposition:  The patient is being discharged in stable condition.  Follow-up:     Follow-up Information   Follow up with [facility]) On 12/21/2012. (At 2:15 PM for Coumadin follow-up and wound check (to have pressure dressing removed))    Specialty:  Cardiology   Contact information:   [address] [place] [address] [phone]      Follow up with [facility]) On 12/26/2012. (At 12:30 PM for wound check and repeat labs (BMET))    Specialty:  Cardiology   Contact information:   [address] [place] [address] [phone]      Follow up with Sherryl Manges, MD On 01/17/2013. (At 9:30 AM to discuss ICD reimplantation)    Specialty:  Cardiology   Contact information:   [address] [place] [address] [phone]      Discharge Medications:    Medication List    STOP taking these medications       aspirin EC 81 MG tablet      TAKE these medications       carvedilol 6.25 MG tablet  Commonly known as:  COREG  Take 6.25 mg  by mouth 2 (two) times daily with a meal.     ezetimibe 10 MG tablet  Commonly known as:  ZETIA  Take 10 mg by mouth at bedtime.     fexofenadine 180 MG tablet  Commonly known as:  ALLEGRA  Take 180 mg by mouth daily.     fluticasone 50 MCG/ACT nasal spray  Commonly known as:  FLONASE  Place 2 sprays into the nose daily as needed for allergies.     folic acid-pyridoxine-cyancobalamin 2.5-25-2 MG Tabs  Commonly known as:  FOLTX  Take 1 tablet by mouth daily.     furosemide 20 MG tablet  Commonly known as:  LASIX  Take 20 mg by mouth daily.      HYDROcodone-acetaminophen 7.5-325 MG per tablet  Commonly known as:  NORCO  Take 1 tablet by mouth every 8 (eight) hours as needed for pain.     levothyroxine 200 MCG tablet  Commonly known as:  SYNTHROID, LEVOTHROID  Take 200 mcg by mouth daily.     methocarbamol 500 MG tablet  Commonly known as:  ROBAXIN  Take 500 mg by mouth 4 (four) times daily as needed (for spasms).     nitroGLYCERIN 0.4 MG SL tablet  Commonly known as:  NITROSTAT  Place 0.4 mg under the tongue every 5 (five) minutes as needed for chest pain.     omega-3 acid ethyl esters 1 G capsule  Commonly known as:  LOVAZA  Take 1 g by mouth daily.     pantoprazole 40 MG tablet  Commonly known as:  PROTONIX  Take 40 mg by mouth 2 (two) times daily.     polyethylene glycol packet  Commonly known as:  MIRALAX / GLYCOLAX  Take 17 g by mouth daily as needed (for constipation).     potassium chloride SA 20 MEQ tablet  Commonly known as:  K-DUR,KLOR-CON  Take 20 mEq by mouth daily.     pregabalin 50 MG capsule  Commonly known as:  LYRICA  Take 50 mg by mouth 3 (three) times daily.     raloxifene 60 MG tablet  Commonly known as:  EVISTA  Take 60 mg by mouth daily.     spironolactone 25 MG tablet  Commonly known as:  ALDACTONE  Take 25 mg by mouth daily.     warfarin 3 MG tablet  Commonly known as:  COUMADIN  Take 1-1.5 tablets (3-4.5 mg total) by mouth daily. Take 1-1/2 tabs (4.5 mg) tonight 12/20/2012. Recheck INR in office tomorrow 12/21/2012.       Duration of Discharge Encounter: Greater than 30 minutes including physician time.  Signed, Rick Duff, PA-C 12/20/2012, 11:59 PM

## 2012-12-21 ENCOUNTER — Ambulatory Visit (INDEPENDENT_AMBULATORY_CARE_PROVIDER_SITE_OTHER): Payer: Medicare Other | Admitting: *Deleted

## 2012-12-21 ENCOUNTER — Ambulatory Visit (INDEPENDENT_AMBULATORY_CARE_PROVIDER_SITE_OTHER): Payer: Medicare Other | Admitting: Pharmacist

## 2012-12-21 DIAGNOSIS — I4891 Unspecified atrial fibrillation: Secondary | ICD-10-CM

## 2012-12-21 DIAGNOSIS — I255 Ischemic cardiomyopathy: Secondary | ICD-10-CM

## 2012-12-21 DIAGNOSIS — I5022 Chronic systolic (congestive) heart failure: Secondary | ICD-10-CM

## 2012-12-21 DIAGNOSIS — I480 Paroxysmal atrial fibrillation: Secondary | ICD-10-CM

## 2012-12-21 DIAGNOSIS — I2589 Other forms of chronic ischemic heart disease: Secondary | ICD-10-CM

## 2012-12-21 NOTE — Progress Notes (Signed)
Wound check post device extraction.  Pressure dressing removed today.  There was a scant amount of bleed today.  Sutures are intact.  The wound was redressed and we will see her back 12/26/12.

## 2012-12-26 ENCOUNTER — Ambulatory Visit (INDEPENDENT_AMBULATORY_CARE_PROVIDER_SITE_OTHER): Payer: Medicare Other | Admitting: *Deleted

## 2012-12-26 ENCOUNTER — Ambulatory Visit (INDEPENDENT_AMBULATORY_CARE_PROVIDER_SITE_OTHER): Payer: Medicare Other | Admitting: Pharmacist

## 2012-12-26 DIAGNOSIS — I4891 Unspecified atrial fibrillation: Secondary | ICD-10-CM

## 2012-12-26 DIAGNOSIS — I480 Paroxysmal atrial fibrillation: Secondary | ICD-10-CM

## 2012-12-26 DIAGNOSIS — I10 Essential (primary) hypertension: Secondary | ICD-10-CM

## 2012-12-26 LAB — BASIC METABOLIC PANEL
BUN: 20 mg/dL (ref 6–23)
CO2: 24 mEq/L (ref 19–32)
Chloride: 103 mEq/L (ref 96–112)
Creatinine, Ser: 1.2 mg/dL (ref 0.4–1.2)

## 2012-12-26 LAB — POCT INR: INR: 2.4

## 2012-12-26 NOTE — Progress Notes (Signed)
Wound check today.  Sutures intact.  Site has some soft swelling.  Per Dr. Ladona Ridgel today ROV 1 week at which time we can remove the sutures.

## 2013-01-02 ENCOUNTER — Ambulatory Visit: Payer: Medicare Other

## 2013-01-03 ENCOUNTER — Ambulatory Visit (INDEPENDENT_AMBULATORY_CARE_PROVIDER_SITE_OTHER): Payer: Self-pay | Admitting: *Deleted

## 2013-01-03 ENCOUNTER — Ambulatory Visit (INDEPENDENT_AMBULATORY_CARE_PROVIDER_SITE_OTHER): Payer: Medicare Other | Admitting: General Practice

## 2013-01-03 DIAGNOSIS — I4891 Unspecified atrial fibrillation: Secondary | ICD-10-CM

## 2013-01-03 DIAGNOSIS — T827XXS Infection and inflammatory reaction due to other cardiac and vascular devices, implants and grafts, sequela: Secondary | ICD-10-CM

## 2013-01-03 DIAGNOSIS — T889XXS Complication of surgical and medical care, unspecified, sequela: Secondary | ICD-10-CM

## 2013-01-03 LAB — POCT INR: INR: 3.6

## 2013-01-03 NOTE — Progress Notes (Signed)
All sutures removed.  Wound healing well.  Follow up as scheduled with Dr. Graciela Husbands.

## 2013-01-17 ENCOUNTER — Telehealth: Payer: Self-pay | Admitting: *Deleted

## 2013-01-17 ENCOUNTER — Ambulatory Visit (INDEPENDENT_AMBULATORY_CARE_PROVIDER_SITE_OTHER): Payer: Medicare Other | Admitting: Internal Medicine

## 2013-01-17 ENCOUNTER — Encounter: Payer: Self-pay | Admitting: *Deleted

## 2013-01-17 ENCOUNTER — Encounter: Payer: Self-pay | Admitting: Internal Medicine

## 2013-01-17 ENCOUNTER — Ambulatory Visit (INDEPENDENT_AMBULATORY_CARE_PROVIDER_SITE_OTHER): Payer: Medicare Other | Admitting: Pharmacist

## 2013-01-17 VITALS — BP 128/87 | HR 125 | Ht 66.0 in | Wt 144.0 lb

## 2013-01-17 DIAGNOSIS — I480 Paroxysmal atrial fibrillation: Secondary | ICD-10-CM

## 2013-01-17 DIAGNOSIS — Z5189 Encounter for other specified aftercare: Secondary | ICD-10-CM

## 2013-01-17 DIAGNOSIS — T827XXD Infection and inflammatory reaction due to other cardiac and vascular devices, implants and grafts, subsequent encounter: Secondary | ICD-10-CM

## 2013-01-17 DIAGNOSIS — I2589 Other forms of chronic ischemic heart disease: Secondary | ICD-10-CM

## 2013-01-17 DIAGNOSIS — I255 Ischemic cardiomyopathy: Secondary | ICD-10-CM

## 2013-01-17 DIAGNOSIS — T827XXA Infection and inflammatory reaction due to other cardiac and vascular devices, implants and grafts, initial encounter: Secondary | ICD-10-CM | POA: Insufficient documentation

## 2013-01-17 DIAGNOSIS — I4891 Unspecified atrial fibrillation: Secondary | ICD-10-CM

## 2013-01-17 DIAGNOSIS — Z95810 Presence of automatic (implantable) cardiac defibrillator: Secondary | ICD-10-CM

## 2013-01-17 DIAGNOSIS — I5022 Chronic systolic (congestive) heart failure: Secondary | ICD-10-CM

## 2013-01-17 LAB — CBC WITH DIFFERENTIAL/PLATELET
Basophils Absolute: 0 10*3/uL (ref 0.0–0.1)
HCT: 37.6 % (ref 36.0–46.0)
Lymphs Abs: 1.3 10*3/uL (ref 0.7–4.0)
MCV: 104.6 fl — ABNORMAL HIGH (ref 78.0–100.0)
Monocytes Absolute: 1 10*3/uL (ref 0.1–1.0)
Neutrophils Relative %: 49 % (ref 43.0–77.0)
Platelets: 136 10*3/uL — ABNORMAL LOW (ref 150.0–400.0)
RDW: 16.7 % — ABNORMAL HIGH (ref 11.5–14.6)

## 2013-01-17 LAB — BASIC METABOLIC PANEL
BUN: 17 mg/dL (ref 6–23)
CO2: 21 mEq/L (ref 19–32)
Chloride: 104 mEq/L (ref 96–112)
Creatinine, Ser: 1.1 mg/dL (ref 0.4–1.2)
GFR: 68.03 mL/min (ref >60.00)
Glucose, Bld: 104 mg/dL — ABNORMAL HIGH (ref 70–99)
Potassium: 4.4 mEq/L (ref 3.5–5.1)

## 2013-01-17 LAB — POCT INR: INR: 1.3

## 2013-01-17 MED ORDER — APIXABAN 5 MG PO TABS: 5.0000 mg | ORAL_TABLET | Freq: Two times a day (BID) | ORAL | Status: DC

## 2013-01-17 NOTE — Progress Notes (Signed)
Patient Care Team: Etta Grandchild, MD as PCP - General Rollene Rotunda, MD (Cardiology)   HPI  [name] is a 62 y.o. female Seen in followup for ICD implanted for primary prevention in the setting of ischemic cardiomyopathy. 2006 she underwent generator replacement because of an infected device and underwent device generator replacement again in March 2014  To generator replacement she developed a pocket erosion and underwent device extraction 9/14  She has a history of inappropriate ICD shocks for atrial arrhythmias. Echocardiogram 2012 demonstrated an EF of 25%  She has  a history of hypertension chronic kidney disease with prior stroke  She noticed the abrupt onset yesterday of significant dyspnea on exertion and fatigue. This is without palpitations.  She is tolerating a    LifeVest with modest accommodation     Past Medical History  Diagnosis Date  . Spinal stenosis   . Numbness of foot     left foot  . Diverticulitis     Status post partial colectomy 1/12 with reversal in July 2012  . Colitis, ischemic   . DJD (degenerative joint disease)     History of multiple surgeries to the back, shoulder and knee  . Ischemic cardiomyopathy     Echo 06/16/10: EF 25-30%, anteroseptal and apical hypokinesis, moderate AI, mild MR  . Chronic systolic heart failure   . CAD (coronary artery disease)     a.  Ant MI 8/03 with stenting of the LAD;  b. staged PCI with Cypher DES to OM1 8/03;  c. s/p Cypher DES to LAD 7/04;  d.  multiple cardiac caths in past (chronically abnl ECG);    e. cardiac catheterization 3/12: LAD stent patent, distal LAD 40-50%, small ostial D1 80%, ostial D2 60%, OM-1 stent patent (20-30%), proximal RCA 50%, proximal to mid RCA 40-50%; f. cath 02/17/2011 - nonobs  . LV (left ventricular) mural thrombus     Chronic Coumadin therapy  . CKD (chronic kidney disease), stage III     creatinine:  1.3 in 4/12;    . Tobacco abuse     history  . GERD  (gastroesophageal reflux disease)   . Hypertension   . Lower GI bleed     August 2011  . Hypothyroidism     TSH 0.19 3/14  . Aortic insufficiency   . Pseudoaneurysm     History of, right forearm  . Hyperlipidemia   . Compression fracture 07/04/11    L2  . 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 Dual implantable cardioverter-defibrillator BSx     Implant 2005, gen change 2005,14  . PAF (paroxysmal atrial fibrillation)   . Anatomical narrow angle of right eye   . Blood transfusion   . Anemia   . H/O hiatal hernia   . Stroke     History of TIA and possible CVA; hospitalized 2004; on coumadin  . Sinus bradycardia 03/05/2012  . Myocardial infarction   . CHF (congestive heart failure)   . Automatic implantable cardioverter-defibrillator in situ     Past Surgical History  Procedure Laterality Date  . Back surgery    . Coronary angioplasty with stent placement  10/2001; 09/2002  . Cardiac defibrillator placement  07/2003; 10/2004  . Lumbar laminectomy/decompression microdiscectomy  10/2001    L5-S1/E-chart  . Knee arthroscopy      left  . Thyroidectomy  1970's  .  Total knee arthroplasty  11/2003    left  . X-stop implantation  12/2004    L3-4; L4-5  . Fixation kyphoplasty thoracic spine  07/2007    T3, 4, 6 compression fractures  . Shoulder open rotator cuff repair  04/2008    right  . Lumbar laminectomy/decompression microdiscectomy  01/2010  . Colectomy  03/2010    sigmoid left  . Colostomy  03/2010    transverse  . Peripherally inserted central catheter insertion  03/2010    removed upon discharge  . Colostomy closure  12/2010    reversal  . Tonsillectomy and adenoidectomy  1969  . Appendectomy  03/2010  . Cataract extraction      right  . Biv icd genertaor change out  12/17/2012  . Icd lead removal Left 12/17/2012    Procedure: ICD LEAD REMOVAL;  Surgeon: Marinus Maw, MD;  Location: [facility] OR;  Service: Cardiovascular;  Laterality: Left;    Current Outpatient Prescriptions  Medication Sig Dispense Refill  . carvedilol (COREG) 6.25 MG tablet Take 6.25 mg by mouth 2 (two) times daily with a meal.      . ezetimibe (ZETIA) 10 MG tablet Take 10 mg by mouth at bedtime.       . fexofenadine (ALLEGRA) 180 MG tablet Take 180 mg by mouth daily.       . fluticasone (FLONASE) 50 MCG/ACT nasal spray Place 2 sprays into the nose daily as needed for allergies.       . folic acid-pyridoxine-cyancobalamin (FOLTX) 2.5-25-2 MG TABS Take 1 tablet by mouth daily.       . furosemide (LASIX) 20 MG tablet Take 20 mg by mouth daily.      [name] HYDROcodone-acetaminophen (NORCO) 7.5-325 MG per tablet Take 1 tablet by mouth every 8 (eight) hours as needed for pain.      [name] levothyroxine (SYNTHROID, LEVOTHROID) 200 MCG tablet Take 200 mcg by mouth daily.      . methocarbamol (ROBAXIN) 500 MG tablet Take 500 mg by mouth 4 (four) times daily as needed (for spasms).       . nitroGLYCERIN (NITROSTAT) 0.4 MG SL tablet Place 0.4 mg under the tongue every 5 (five) minutes as needed for chest pain.       [name] omega-3 acid ethyl esters (LOVAZA) 1 G capsule Take 1 g by mouth daily.      . pantoprazole (PROTONIX) 40 MG tablet Take 40 mg by mouth 2 (two) times daily.       . polyethylene glycol (MIRALAX / GLYCOLAX) packet Take 17 g by mouth daily as needed (for constipation).       . potassium chloride SA (K-DUR,KLOR-CON) 20 MEQ tablet Take 20 mEq by mouth daily.      . pregabalin (LYRICA) 50 MG capsule Take 50 mg by mouth 3 (three) times daily.      . raloxifene (EVISTA) 60 MG tablet Take 60 mg by mouth daily.      [name] spironolactone (ALDACTONE) 25 MG tablet Take 25 mg by mouth daily.      [name] warfarin (COUMADIN) 3 MG tablet Take 1-1.5 tablets (3-4.5 mg total) by mouth daily. Take 1-1/2 tabs (4.5 mg) tonight 12/20/2012. Recheck INR in office tomorrow 12/21/2012.       No current facility-administered  medications for this visit.    Allergies  Allergen Reactions  . Lansoprazole Nausea Only  . Penicillins Swelling    Throat swells  . Statins Other (See Comments)    cramps  .  Lisinopril     cough  . Lactose Intolerance (Gi) Diarrhea and Other (See Comments)    Patient reports abdominal cramping and diarrhea with lactose products.     Review of Systems negative except from HPI and PMH  Physical Exam BP 128/87  Pulse 125  Ht 5\' 6"  (1.676 m)  Wt 144 lb (65.318 kg)  BMI 23.25 kg/m2  SpO2 97% Well developed and well nourished in no acute distress HENT normal E scleral and icterus clear Neck Supple JVP  ; carotids brisk and full Clear to ausculation RAPID BUT  Regular rate and rhythm, no murmurs gallops or rub Soft with active bowel sounds No clubbing cyanosis none Edema Alert and oriented, grossly normal motor and sensory function Skin Warm and Dry  ECG demonstrates typical atrial flutter with an atrial cycle length approximately 240 ms and 2:  1 conduction  Assessment and  Plan

## 2013-01-17 NOTE — Assessment & Plan Note (Addendum)
She is on beta blockers and ARB therapy. We'll add ARB therapy in the hopes that we can improve left ventricular function. We will plan re\re echo in about 4 weeks.

## 2013-01-17 NOTE — Patient Instructions (Addendum)
Your physician has recommended that you have a Cardioversion (DCCV) . Electrical Cardioversion uses a jolt of electricity to your heart either through paddles or wired patches attached to your chest. This is a controlled, usually prescheduled, procedure. Defibrillation is done under light anesthesia in the hospital, and you usually go home the day of the procedure. This is done to get your heart back into a normal rhythm. You are not awake for the procedure. Please see the instruction sheet given to you today.  Please call me to let me know what dose of Lisinopril you are taking. -- Dory Horn, RN [phone]  Your physician has requested that you have an echocardiogram in 4 weeks. Echocardiography is a painless test that uses sound waves to create images of your heart. It provides your doctor with information about the size and shape of your heart and how well your heart's chambers and valves are working. This procedure takes approximately one hour. There are no restrictions for this procedure.   Your physician recommends that you have lab work today: BMET/CBCD/INR (with coumadin clinic)

## 2013-01-17 NOTE — Assessment & Plan Note (Signed)
We have reviewed the strategy for device reimplantation. We will resume ARB therapy. We'll cardiovert her to remove this impact of LV function. We will then plan echo reassessment prior to decision to reimplant

## 2013-01-17 NOTE — Assessment & Plan Note (Signed)
She has atypical atrial flutter with 21 conduction. This likely is contributing to the abrupt onset of exercise intolerance. Because this can also have an impact on  left ventricular function we will plan to cardiovert her urgently and then reassess left ventricular function in about 3-4 weeks time to see if there has been interval improvement which might allow [place] to avoid device reimplantation. I have reviewed with her and her brother The plan and the potential concerns relating to device not reimplantation if her ejection fraction has in fact improved.

## 2013-01-17 NOTE — Assessment & Plan Note (Signed)
She is euvolemic but currently struggling with dyspnea on exertion likely related to rapid rate.

## 2013-01-17 NOTE — Patient Instructions (Signed)
1.  Stop coumadin 2.  Start Eliquis 5 mg twice daily. 3.  Cardioversion Monday 11/3 4.  See Riki Rusk again 12/17 at 11:15 am in [facility] in 6 weeks to discuss Eliquis vs Coumadin

## 2013-01-17 NOTE — Telephone Encounter (Signed)
Informed patient of new DCCV time for Monday November 3rd at 3pm. Patient verbalized understanding and agreeable to plan.

## 2013-01-18 ENCOUNTER — Encounter (HOSPITAL_COMMUNITY): Payer: Self-pay

## 2013-01-21 ENCOUNTER — Encounter (HOSPITAL_COMMUNITY): Payer: Self-pay

## 2013-01-21 ENCOUNTER — Encounter (HOSPITAL_COMMUNITY)
Admission: RE | Disposition: A | Discharge status: Home / Self Care | Payer: Self-pay | Source: Ambulatory Visit | Attending: Internal Medicine

## 2013-01-21 ENCOUNTER — Telehealth: Payer: Self-pay | Admitting: Internal Medicine

## 2013-01-21 ENCOUNTER — Ambulatory Visit (HOSPITAL_COMMUNITY)
Admission: RE | Admit: 2013-01-21 | Discharge: 2013-01-21 | Disposition: A | Discharge status: Home / Self Care | Payer: Medicare Other | Source: Ambulatory Visit | Attending: Internal Medicine | Admitting: Internal Medicine

## 2013-01-21 DIAGNOSIS — Z5309 Procedure and treatment not carried out because of other contraindication: Secondary | ICD-10-CM | POA: Insufficient documentation

## 2013-01-21 DIAGNOSIS — I4891 Unspecified atrial fibrillation: Secondary | ICD-10-CM | POA: Insufficient documentation

## 2013-01-21 DIAGNOSIS — Z95810 Presence of automatic (implantable) cardiac defibrillator: Secondary | ICD-10-CM

## 2013-01-21 DIAGNOSIS — T827XXD Infection and inflammatory reaction due to other cardiac and vascular devices, implants and grafts, subsequent encounter: Secondary | ICD-10-CM

## 2013-01-21 DIAGNOSIS — I255 Ischemic cardiomyopathy: Secondary | ICD-10-CM

## 2013-01-21 DIAGNOSIS — I480 Paroxysmal atrial fibrillation: Secondary | ICD-10-CM

## 2013-01-21 DIAGNOSIS — I5022 Chronic systolic (congestive) heart failure: Secondary | ICD-10-CM

## 2013-01-21 SURGERY — CANCELLED PROCEDURE

## 2013-01-21 MED ORDER — SODIUM CHLORIDE 0.9 % IV SOLN: INTRAVENOUS | Status: DC

## 2013-01-21 NOTE — Progress Notes (Signed)
Called by RN re: TEE/DCCV  Pt here today for the procedure but is in SR.  She feels no real difference in her SOB/DOE. She never got palpitations. She has had no chest pain.  She has no weight gain and is compliant with meds and dietary restrictions.  Advised her OK to try to gradually increase activity.  She should check her BP daily and any time she feels unusually SOB or weak, to see if HR is up (if so, likely back in afib).  Continue to wear the LifeVest.   Keep f/u appt with Dr. Graciela Husbands. The office will call.

## 2013-01-21 NOTE — Telephone Encounter (Signed)
New problem   Per Bjorn Loser PA calling pt need to see Dr Graciela Husbands eph please see when he would like to see pt and sched per [facility].

## 2013-01-24 ENCOUNTER — Other Ambulatory Visit: Payer: Self-pay

## 2013-01-30 NOTE — Telephone Encounter (Signed)
Left message for patient - offered 12/9 at 2:45pm for follow up per Dr. Graciela Husbands.

## 2013-02-06 NOTE — Telephone Encounter (Signed)
Left another message for patient to call in regards to scheduling follow up in December per Dr. Graciela Husbands request.

## 2013-02-25 ENCOUNTER — Telehealth: Payer: Self-pay | Admitting: *Deleted

## 2013-02-25 NOTE — Telephone Encounter (Signed)
Patient needs refill of spironolactone to be sent to [facility] on [address] [facility] [address] [facility]. Thanks, MI

## 2013-02-26 MED ORDER — SPIRONOLACTONE 25 MG PO TABS: 25.0000 mg | ORAL_TABLET | Freq: Every day | ORAL | Status: DC

## 2013-02-26 NOTE — Telephone Encounter (Signed)
Done

## 2013-03-06 ENCOUNTER — Encounter: Payer: Self-pay | Admitting: Internal Medicine

## 2013-03-06 ENCOUNTER — Ambulatory Visit: Payer: Medicare Other | Admitting: Pharmacist

## 2013-03-06 ENCOUNTER — Ambulatory Visit (INDEPENDENT_AMBULATORY_CARE_PROVIDER_SITE_OTHER): Payer: Medicare Other | Admitting: Internal Medicine

## 2013-03-06 VITALS — BP 90/65 | HR 116 | Ht 66.0 in | Wt 146.0 lb

## 2013-03-06 DIAGNOSIS — I2589 Other forms of chronic ischemic heart disease: Secondary | ICD-10-CM

## 2013-03-06 DIAGNOSIS — I5022 Chronic systolic (congestive) heart failure: Secondary | ICD-10-CM

## 2013-03-06 DIAGNOSIS — I255 Ischemic cardiomyopathy: Secondary | ICD-10-CM

## 2013-03-06 DIAGNOSIS — I4891 Unspecified atrial fibrillation: Secondary | ICD-10-CM

## 2013-03-06 DIAGNOSIS — I4892 Unspecified atrial flutter: Secondary | ICD-10-CM

## 2013-03-06 NOTE — Assessment & Plan Note (Signed)
Euvolemic but heart failure symptoms associated with rapid ventricular rates blood pressure because of titration; potentially could use digoxin (to proceed with cardioversion more rapidly than that

## 2013-03-06 NOTE — Assessment & Plan Note (Signed)
As above.

## 2013-03-06 NOTE — Progress Notes (Signed)
Patient Care Team: Etta Grandchild, MD as PCP - General Rollene Rotunda, MD (Cardiology)   HPI  [name] is a 62 y.o. female Seen in followup for an ICD implanted for primary prevention and for which she underwent device revision in 2006. She underwent repeat operation by Dr. 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 She underwent device extraction September 2014 And was discharged on a LifeVest  Echocardiogram 2012 demonstrated significant left ventricular dysfunction with an EF of 25%.  Also has history of paroxysmal atrial fibrillation for which she takes apixaban  She ran out of apixaban a couple of days ago has missed some doses.  She comes in now with complaints of exercise intolerance. She is also bothered by her LifeVest  Past Medical History  Diagnosis Date  . Spinal stenosis   . Numbness of foot     left foot  . Diverticulitis     Status post partial colectomy 1/12 with reversal in July 2012  . Colitis, ischemic   . DJD (degenerative joint disease)     History of multiple surgeries to the back, shoulder and knee  . Ischemic cardiomyopathy     Echo 06/16/10: EF 25-30%, anteroseptal and apical hypokinesis, moderate AI, mild MR  . Chronic systolic heart failure   . CAD (coronary artery disease)     a.  Ant MI 8/03 with stenting of the LAD;  b. staged PCI with Cypher DES to OM1 8/03;  c. s/p Cypher DES to LAD 7/04;  d.  multiple cardiac caths in past (chronically abnl ECG);    e. cardiac catheterization 3/12: LAD stent patent, distal LAD 40-50%, small ostial D1 80%, ostial D2 60%, OM-1 stent patent (20-30%), proximal RCA 50%, proximal to mid RCA 40-50%; f. cath 02/17/2011 - nonobs  . LV (left ventricular) mural thrombus     Chronic Coumadin therapy  . CKD (chronic kidney disease), stage III     creatinine:  1.3 in 4/12;    . Tobacco abuse     history  . GERD (gastroesophageal reflux disease)   . Hypertension   . Lower GI bleed     August 2011  . Hypothyroidism     TSH 0.19 3/14  . Aortic insufficiency   . Pseudoaneurysm     History of, right forearm  . Hyperlipidemia   . Compression fracture 07/04/11    L2  . 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 Dual implantable cardioverter-defibrillator BSx     Implant 2005, gen change 2005,14  . PAF (paroxysmal atrial fibrillation)   . Anatomical narrow angle of right eye   . Blood transfusion   . Anemia   . H/O hiatal hernia   . Stroke     History of TIA and possible CVA; hospitalized 2004; on coumadin  . Sinus bradycardia 03/05/2012  . Myocardial infarction   . CHF (congestive heart failure)   . Automatic implantable cardioverter-defibrillator in situ     Past Surgical History  Procedure Laterality Date  . Back surgery    . Coronary angioplasty with stent placement  10/2001; 09/2002  . Cardiac defibrillator placement  07/2003; 10/2004  . Lumbar laminectomy/decompression microdiscectomy  10/2001    L5-S1/E-chart  . Knee arthroscopy      left  . Thyroidectomy  1970's  . Total knee arthroplasty  11/2003  left  . X-stop implantation  12/2004    L3-4; L4-5  . Fixation kyphoplasty thoracic spine  07/2007    T3, 4, 6 compression fractures  . Shoulder open rotator cuff repair  04/2008    right  . Lumbar laminectomy/decompression microdiscectomy  01/2010  . Colectomy  03/2010    sigmoid left  . Colostomy  03/2010    transverse  . Peripherally inserted central catheter insertion  03/2010    removed upon discharge  . Colostomy closure  12/2010    reversal  . Tonsillectomy and adenoidectomy  1969  . Appendectomy  03/2010  . Cataract extraction      right  . Biv icd genertaor change out  12/17/2012  . Icd lead removal Left 12/17/2012    Procedure: ICD LEAD REMOVAL;  Surgeon: Marinus Maw, MD;  Location: [facility] OR;  Service: Cardiovascular;  Laterality: Left;      Current Outpatient Prescriptions  Medication Sig Dispense Refill  . apixaban (ELIQUIS) 5 MG TABS tablet Take 1 tablet (5 mg total) by mouth 2 (two) times daily.  60 tablet  5  . carvedilol (COREG) 6.25 MG tablet Take 6.25 mg by mouth 2 (two) times daily with a meal.      . ezetimibe (ZETIA) 10 MG tablet Take 10 mg by mouth at bedtime.       . fexofenadine (ALLEGRA) 180 MG tablet Take 180 mg by mouth daily.       . fluticasone (FLONASE) 50 MCG/ACT nasal spray Place 2 sprays into the nose daily as needed for allergies.       . folic acid-pyridoxine-cyancobalamin (FOLTX) 2.5-25-2 MG TABS Take 1 tablet by mouth daily.       . furosemide (LASIX) 20 MG tablet Take 20 mg by mouth daily.      [name] HYDROcodone-acetaminophen (NORCO) 7.5-325 MG per tablet Take 1 tablet by mouth every 8 (eight) hours as needed for pain.      [name] levothyroxine (SYNTHROID, LEVOTHROID) 200 MCG tablet Take 200 mcg by mouth daily before breakfast.       . methocarbamol (ROBAXIN) 500 MG tablet Take 500 mg by mouth 4 (four) times daily as needed (for spasms).       . nitroGLYCERIN (NITROSTAT) 0.4 MG SL tablet Place 0.4 mg under the tongue every 5 (five) minutes as needed for chest pain.       [name] omega-3 acid ethyl esters (LOVAZA) 1 G capsule Take 1 g by mouth daily.      . pantoprazole (PROTONIX) 40 MG tablet Take 40 mg by mouth 2 (two) times daily.       . polyethylene glycol (MIRALAX / GLYCOLAX) packet Take 17 g by mouth daily as needed (for constipation).       . potassium chloride SA (K-DUR,KLOR-CON) 20 MEQ tablet Take 20 mEq by mouth daily.      . raloxifene (EVISTA) 60 MG tablet Take 60 mg by mouth daily.      [name] spironolactone (ALDACTONE) 25 MG tablet Take 1 tablet (25 mg total) by mouth daily.  30 tablet  5   No current facility-administered medications for this visit.    Allergies  Allergen Reactions  . Lansoprazole Nausea Only  . Penicillins Swelling    Throat swells  . Statins Other (See Comments)    cramps  .  Lisinopril     Cough- but still tolerates it  . Lactose Intolerance (Gi) Diarrhea and Other (See Comments)    Patient reports abdominal  cramping and diarrhea with lactose products.     Review of Systems negative except from HPI and PMH  Physical Exam BP 90/65  Pulse 116  Ht 5\' 6"  (1.676 m)  Wt 146 lb (66.225 kg)  BMI 23.58 kg/m2 Well developed and well nourished in no acute distress HENT normal E scleral and icterus clear Neck Supple JVP 8-9 carotids brisk and full Clear to ausculation Rapid and regular rate and rhythm, no murmurs gallops or rub Soft with active bowel sounds No clubbing cyanosis none Edema Alert and oriented, grossly normal motor and sensory function Skin Warm and Dry  ECG demonstrates atrial flutter with 2 to one conduction. Intervals-/08/33 Atrial flutter/tachycardia morphology is atypical  Assessment and  Plan

## 2013-03-06 NOTE — Assessment & Plan Note (Signed)
The patient has recurrent atrial arrhythmia. Will get a rapid ventricular response. The impact of this if LV function is concerning as well as her symptoms of exercise intolerance. As she has missed some doses of apixaban, we will need to proceed with TEE guided cardioversion.  I would like him to reassess left ventricular function in about 3 weeks later in the event that he has recovered he may perhaps be able to void device reimplantation. In the event that has not recovered, and I will let sanguine that well, we'll proceed with ICD implantation at that time. Currently blood pressure refill is further up titration of medications.

## 2013-03-07 ENCOUNTER — Encounter: Payer: Self-pay | Admitting: *Deleted

## 2013-03-07 ENCOUNTER — Telehealth: Payer: Self-pay | Admitting: Internal Medicine

## 2013-03-07 ENCOUNTER — Other Ambulatory Visit: Payer: Self-pay | Admitting: *Deleted

## 2013-03-07 DIAGNOSIS — Z01812 Encounter for preprocedural laboratory examination: Secondary | ICD-10-CM

## 2013-03-07 DIAGNOSIS — I4891 Unspecified atrial fibrillation: Secondary | ICD-10-CM

## 2013-03-07 NOTE — Telephone Encounter (Signed)
Advised pt I was in process of scheduling her TEE DCCV and will be in touch later today. She is agreeable to that.

## 2013-03-07 NOTE — Telephone Encounter (Signed)
Follow Up ° ° °Pt returning call from earlier. Please call back. °

## 2013-03-07 NOTE — Telephone Encounter (Signed)
TEE DCCV scheduled for 12/24 at 1pm with Bensimhon. Reviewed procedure instructions with pt. Pre procedure labs scheduled for 12/22. Letter on instructions left at front desk for pt to pick up when she comes in for her lab work. Pt verbalized understanding and agreeable to plan.

## 2013-03-11 ENCOUNTER — Encounter (HOSPITAL_COMMUNITY): Payer: Self-pay | Admitting: Emergency Medicine

## 2013-03-11 ENCOUNTER — Other Ambulatory Visit: Payer: Medicare Other

## 2013-03-11 ENCOUNTER — Emergency Department (HOSPITAL_COMMUNITY): Payer: Medicare Other

## 2013-03-11 ENCOUNTER — Observation Stay (HOSPITAL_COMMUNITY)
Admission: EM | Admit: 2013-03-11 | Discharge: 2013-03-12 | Disposition: A | Discharge status: Home / Self Care | Payer: Medicare Other | Attending: Interventional Cardiology | Admitting: Interventional Cardiology

## 2013-03-11 DIAGNOSIS — N183 Chronic kidney disease, stage 3 unspecified: Secondary | ICD-10-CM | POA: Insufficient documentation

## 2013-03-11 DIAGNOSIS — E039 Hypothyroidism, unspecified: Secondary | ICD-10-CM | POA: Insufficient documentation

## 2013-03-11 DIAGNOSIS — I4891 Unspecified atrial fibrillation: Secondary | ICD-10-CM

## 2013-03-11 DIAGNOSIS — Z7901 Long term (current) use of anticoagulants: Secondary | ICD-10-CM

## 2013-03-11 DIAGNOSIS — I255 Ischemic cardiomyopathy: Secondary | ICD-10-CM

## 2013-03-11 DIAGNOSIS — Z95810 Presence of automatic (implantable) cardiac defibrillator: Secondary | ICD-10-CM | POA: Insufficient documentation

## 2013-03-11 DIAGNOSIS — I129 Hypertensive chronic kidney disease with stage 1 through stage 4 chronic kidney disease, or unspecified chronic kidney disease: Secondary | ICD-10-CM | POA: Insufficient documentation

## 2013-03-11 DIAGNOSIS — N189 Chronic kidney disease, unspecified: Secondary | ICD-10-CM

## 2013-03-11 DIAGNOSIS — R079 Chest pain, unspecified: Principal | ICD-10-CM | POA: Diagnosis present

## 2013-03-11 DIAGNOSIS — Z87891 Personal history of nicotine dependence: Secondary | ICD-10-CM | POA: Insufficient documentation

## 2013-03-11 DIAGNOSIS — I252 Old myocardial infarction: Secondary | ICD-10-CM | POA: Insufficient documentation

## 2013-03-11 DIAGNOSIS — I509 Heart failure, unspecified: Secondary | ICD-10-CM | POA: Insufficient documentation

## 2013-03-11 DIAGNOSIS — I2510 Atherosclerotic heart disease of native coronary artery without angina pectoris: Secondary | ICD-10-CM

## 2013-03-11 DIAGNOSIS — E785 Hyperlipidemia, unspecified: Secondary | ICD-10-CM | POA: Insufficient documentation

## 2013-03-11 DIAGNOSIS — I5022 Chronic systolic (congestive) heart failure: Secondary | ICD-10-CM | POA: Insufficient documentation

## 2013-03-11 DIAGNOSIS — I4892 Unspecified atrial flutter: Secondary | ICD-10-CM

## 2013-03-11 DIAGNOSIS — K219 Gastro-esophageal reflux disease without esophagitis: Secondary | ICD-10-CM | POA: Insufficient documentation

## 2013-03-11 DIAGNOSIS — I2589 Other forms of chronic ischemic heart disease: Secondary | ICD-10-CM | POA: Insufficient documentation

## 2013-03-11 LAB — COMPREHENSIVE METABOLIC PANEL
ALT: 41 U/L — ABNORMAL HIGH (ref 0–35)
Albumin: 3.3 g/dL — ABNORMAL LOW (ref 3.5–5.2)
Alkaline Phosphatase: 76 U/L (ref 39–117)
BUN: 15 mg/dL (ref 6–23)
CO2: 25 mEq/L (ref 19–32)
Calcium: 8.5 mg/dL (ref 8.4–10.5)
Creatinine, Ser: 1.14 mg/dL — ABNORMAL HIGH (ref 0.50–1.10)
Glucose, Bld: 105 mg/dL — ABNORMAL HIGH (ref 70–99)
Potassium: 3.9 mEq/L (ref 3.5–5.1)
Sodium: 138 mEq/L (ref 135–145)
Total Protein: 7.2 g/dL (ref 6.0–8.3)

## 2013-03-11 LAB — CBC WITH DIFFERENTIAL/PLATELET
Basophils Relative: 1 % (ref 0–1)
Eosinophils Absolute: 0.1 10*3/uL (ref 0.0–0.7)
Eosinophils Relative: 2 % (ref 0–5)
Hemoglobin: 13.9 g/dL (ref 12.0–15.0)
Lymphocytes Relative: 33 % (ref 12–46)
MCH: 35.3 pg — ABNORMAL HIGH (ref 26.0–34.0)
MCHC: 35.4 g/dL (ref 30.0–36.0)
MCV: 99.7 fL (ref 78.0–100.0)
Neutrophils Relative %: 51 % (ref 43–77)
Platelets: 181 10*3/uL (ref 150–400)
RBC: 3.94 MIL/uL (ref 3.87–5.11)

## 2013-03-11 LAB — PRO B NATRIURETIC PEPTIDE: Pro B Natriuretic peptide (BNP): 1404 pg/mL — ABNORMAL HIGH (ref 0–125)

## 2013-03-11 LAB — PROTIME-INR: INR: 1.58 — ABNORMAL HIGH (ref 0.00–1.49)

## 2013-03-11 LAB — TROPONIN I
Troponin I: <0.3 ng/mL (ref <0.30)
Troponin I: <0.3 ng/mL (ref <0.30)

## 2013-03-11 MED ORDER — RALOXIFENE HCL 60 MG PO TABS
60.0000 mg | ORAL_TABLET | Freq: Every day | ORAL | Status: DC
  Administered 2013-03-11 – 2013-03-12 (×2): 60 mg via ORAL
  Filled 2013-03-11 (×2): qty 1

## 2013-03-11 MED ORDER — EZETIMIBE 10 MG PO TABS
10.0000 mg | ORAL_TABLET | Freq: Every day | ORAL | Status: DC
  Administered 2013-03-11: 10 mg via ORAL
  Filled 2013-03-11 (×2): qty 1

## 2013-03-11 MED ORDER — NITROGLYCERIN 0.3 MG SL SUBL: 0.3000 mg | SUBLINGUAL_TABLET | SUBLINGUAL | Status: DC | PRN

## 2013-03-11 MED ORDER — LORATADINE 10 MG PO TABS
10.0000 mg | ORAL_TABLET | Freq: Every day | ORAL | Status: DC
  Administered 2013-03-11 – 2013-03-12 (×2): 10 mg via ORAL
  Filled 2013-03-11 (×2): qty 1

## 2013-03-11 MED ORDER — ONDANSETRON HCL 4 MG/2ML IJ SOLN
4.0000 mg | Freq: Once | INTRAMUSCULAR | Status: AC
  Administered 2013-03-11: 4 mg via INTRAVENOUS
  Filled 2013-03-11: qty 2

## 2013-03-11 MED ORDER — ONDANSETRON HCL 4 MG/2ML IJ SOLN: 4.0000 mg | Freq: Four times a day (QID) | INTRAMUSCULAR | Status: DC | PRN

## 2013-03-11 MED ORDER — SPIRONOLACTONE 25 MG PO TABS
25.0000 mg | ORAL_TABLET | Freq: Every day | ORAL | Status: DC
  Administered 2013-03-11: 22:00:00 25 mg via ORAL
  Filled 2013-03-11 (×2): qty 1

## 2013-03-11 MED ORDER — NITROGLYCERIN 0.4 MG SL SUBL: 0.4000 mg | SUBLINGUAL_TABLET | SUBLINGUAL | Status: DC | PRN

## 2013-03-11 MED ORDER — LEVOTHYROXINE SODIUM 200 MCG PO TABS
200.0000 ug | ORAL_TABLET | Freq: Every day | ORAL | Status: DC
  Administered 2013-03-12: 200 ug via ORAL
  Filled 2013-03-11 (×2): qty 1

## 2013-03-11 MED ORDER — APIXABAN 5 MG PO TABS
5.0000 mg | ORAL_TABLET | Freq: Two times a day (BID) | ORAL | Status: DC
  Administered 2013-03-11 – 2013-03-12 (×2): 5 mg via ORAL
  Filled 2013-03-11 (×3): qty 1

## 2013-03-11 MED ORDER — CARVEDILOL 6.25 MG PO TABS
6.2500 mg | ORAL_TABLET | Freq: Two times a day (BID) | ORAL | Status: DC
  Administered 2013-03-12: 06:00:00 6.25 mg via ORAL
  Filled 2013-03-11 (×3): qty 1

## 2013-03-11 MED ORDER — POLYETHYLENE GLYCOL 3350 17 G PO PACK
17.0000 g | PACK | Freq: Every day | ORAL | Status: DC | PRN
  Filled 2013-03-11: qty 1

## 2013-03-11 MED ORDER — PANTOPRAZOLE SODIUM 40 MG PO TBEC: 40.0000 mg | DELAYED_RELEASE_TABLET | Freq: Two times a day (BID) | ORAL | Status: DC | PRN

## 2013-03-11 MED ORDER — NITROGLYCERIN IN D5W 200-5 MCG/ML-% IV SOLN: 3.0000 ug/min | INTRAVENOUS | Status: DC

## 2013-03-11 MED ORDER — METHOCARBAMOL 500 MG PO TABS: 500.0000 mg | ORAL_TABLET | Freq: Four times a day (QID) | ORAL | Status: DC | PRN

## 2013-03-11 MED ORDER — ACETAMINOPHEN 325 MG PO TABS: 650.0000 mg | ORAL_TABLET | ORAL | Status: DC | PRN

## 2013-03-11 MED ORDER — FLUTICASONE PROPIONATE 50 MCG/ACT NA SUSP: 2.0000 | Freq: Every day | NASAL | Status: DC | PRN

## 2013-03-11 MED ORDER — POTASSIUM CHLORIDE CRYS ER 20 MEQ PO TBCR
20.0000 meq | EXTENDED_RELEASE_TABLET | Freq: Every day | ORAL | Status: DC
  Administered 2013-03-11 – 2013-03-12 (×2): 20 meq via ORAL
  Filled 2013-03-11 (×2): qty 1

## 2013-03-11 MED ORDER — FUROSEMIDE 20 MG PO TABS
20.0000 mg | ORAL_TABLET | Freq: Every day | ORAL | Status: DC
  Administered 2013-03-11: 20 mg via ORAL
  Filled 2013-03-11 (×2): qty 1

## 2013-03-11 MED ORDER — FA-PYRIDOXINE-CYANOCOBALAMIN 2.5-25-2 MG PO TABS
1.0000 | ORAL_TABLET | Freq: Every day | ORAL | Status: DC
  Administered 2013-03-11 – 2013-03-12 (×2): 1 via ORAL
  Filled 2013-03-11 (×3): qty 1

## 2013-03-11 NOTE — ED Provider Notes (Signed)
CSN: [id number]     Arrival date & time 03/11/13  1545 History   First MD Initiated Contact with Patient 03/11/13 1545     Chief Complaint  Patient presents with  . Chest Pain   (Consider location/radiation/quality/duration/timing/severity/associated sxs/prior Treatment) Patient is a 62 y.o. female presenting with chest pain. The history is provided by the patient.  Chest Pain Pain location:  Substernal area (right shoulder) Pain quality: sharp and tightness   Pain radiates to:  R shoulder Pain radiates to the back: no   Pain severity:  Mild Onset quality:  Gradual Duration:  4 hours Timing:  Intermittent Progression:  Worsening Chronicity:  New Context: eating   Relieved by:  Nitroglycerin Worsened by:  Nothing tried Ineffective treatments:  None tried Associated symptoms: nausea   Associated symptoms: no abdominal pain, no back pain, no cough, no dizziness, no fatigue, no fever, no headache, no shortness of breath and not vomiting   Associated symptoms comment:  Belching    Past Medical History  Diagnosis Date  . Spinal stenosis   . Numbness of foot     left foot  . Diverticulitis     Status post partial colectomy 1/12 with reversal in July 2012  . Colitis, ischemic   . DJD (degenerative joint disease)     History of multiple surgeries to the back, shoulder and knee  . Ischemic cardiomyopathy     Echo 06/16/10: EF 25-30%, anteroseptal and apical hypokinesis, moderate AI, mild MR  . Chronic systolic heart failure   . CAD (coronary artery disease)     a.  Ant MI 8/03 with stenting of the LAD;  b. staged PCI with Cypher DES to OM1 8/03;  c. s/p Cypher DES to LAD 7/04;  d.  multiple cardiac caths in past (chronically abnl ECG);    e. cardiac catheterization 3/12: LAD stent patent, distal LAD 40-50%, small ostial D1 80%, ostial D2 60%, OM-1 stent patent (20-30%), proximal RCA 50%, proximal to mid RCA 40-50%; f. cath 02/17/2011 - nonobs  . LV (left ventricular) mural thrombus      Chronic Coumadin therapy  . CKD (chronic kidney disease), stage III     creatinine:  1.3 in 4/12;    . Tobacco abuse     history  . GERD (gastroesophageal reflux disease)   . Hypertension   . Lower GI bleed     August 2011  . Hypothyroidism     TSH 0.19 3/14  . Aortic insufficiency   . Pseudoaneurysm     History of, right forearm  . Hyperlipidemia   . Compression fracture 07/04/11    L2  . 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 Dual implantable cardioverter-defibrillator BSx     Implant 2005, gen change 2005,14  . PAF (paroxysmal atrial fibrillation)   . Anatomical narrow angle of right eye   . Blood transfusion   . Anemia   . H/O hiatal hernia   . Stroke     History of TIA and possible CVA; hospitalized 2004; on coumadin  . Sinus bradycardia 03/05/2012  . Myocardial infarction   . CHF (congestive heart failure)   . Automatic implantable cardioverter-defibrillator in situ    Past Surgical History  Procedure Laterality Date  . Back surgery    . Coronary angioplasty with stent placement  10/2001; 09/2002  . Cardiac defibrillator placement  07/2003; 10/2004  .  Lumbar laminectomy/decompression microdiscectomy  10/2001    L5-S1/E-chart  . Knee arthroscopy      left  . Thyroidectomy  1970's  . Total knee arthroplasty  11/2003    left  . X-stop implantation  12/2004    L3-4; L4-5  . Fixation kyphoplasty thoracic spine  07/2007    T3, 4, 6 compression fractures  . Shoulder open rotator cuff repair  04/2008    right  . Lumbar laminectomy/decompression microdiscectomy  01/2010  . Colectomy  03/2010    sigmoid left  . Colostomy  03/2010    transverse  . Peripherally inserted central catheter insertion  03/2010    removed upon discharge  . Colostomy closure  12/2010    reversal  . Tonsillectomy and adenoidectomy  1969  . Appendectomy  03/2010  . Cataract extraction       right  . Biv icd genertaor change out  12/17/2012  . Icd lead removal Left 12/17/2012    Procedure: ICD LEAD REMOVAL;  Surgeon: Marinus Maw, MD;  Location: [facility] OR;  Service: Cardiovascular;  Laterality: Left;   Family History  Problem Relation Age of Onset  . Diabetes Mother   . Diabetes Brother   . Arthritis      family history  . Prostate cancer      Family History   History  Substance Use Topics  . Smoking status: Former Smoker -- 0.10 packs/day for 33 years    Types: Cigarettes    Quit date: 04/21/2001  . Smokeless tobacco: Never Used     Comment: "smoked ~ 1 pack/month"  . Alcohol Use: No     Comment: 07/05/11 "occasionally; last time was glass of wine last week"   OB History   Grav Para Term Preterm Abortions TAB SAB Ect Mult Living                 Review of Systems  Constitutional: Negative for fever and fatigue.  HENT: Negative for congestion and drooling.   Eyes: Negative for pain.  Respiratory: Positive for chest tightness. Negative for cough and shortness of breath.   Cardiovascular: Positive for chest pain.  Gastrointestinal: Positive for nausea. Negative for vomiting, abdominal pain and diarrhea.  Genitourinary: Negative for dysuria and hematuria.  Musculoskeletal: Negative for back pain, gait problem and neck pain.  Skin: Negative for color change.  Neurological: Negative for dizziness and headaches.  Hematological: Negative for adenopathy.  Psychiatric/Behavioral: Negative for behavioral problems.  All other systems reviewed and are negative.    Allergies  Lansoprazole; Penicillins; Statins; Lisinopril; and Lactose intolerance (gi)  Home Medications   Current Outpatient Rx  Name  Route  Sig  Dispense  Refill  . apixaban (ELIQUIS) 5 MG TABS tablet   Oral   Take 1 tablet (5 mg total) by mouth 2 (two) times daily.   60 tablet   5   . carvedilol (COREG) 6.25 MG tablet   Oral   Take 6.25 mg by mouth 2 (two) times daily with a meal.          . ezetimibe (ZETIA) 10 MG tablet   Oral   Take 10 mg by mouth at bedtime.          . fexofenadine (ALLEGRA) 180 MG tablet   Oral   Take 180 mg by mouth daily.          . fluticasone (FLONASE) 50 MCG/ACT nasal spray   Nasal   Place 2 sprays into the nose daily  as needed for allergies.          . folic acid-pyridoxine-cyancobalamin (FOLTX) 2.5-25-2 MG TABS   Oral   Take 1 tablet by mouth daily.          . furosemide (LASIX) 20 MG tablet   Oral   Take 20 mg by mouth daily.         [name] HYDROcodone-acetaminophen (NORCO) 7.5-325 MG per tablet   Oral   Take 1 tablet by mouth every 8 (eight) hours as needed for pain.         [name] levothyroxine (SYNTHROID, LEVOTHROID) 200 MCG tablet   Oral   Take 200 mcg by mouth daily before breakfast.          . methocarbamol (ROBAXIN) 500 MG tablet   Oral   Take 500 mg by mouth 4 (four) times daily as needed (for spasms).          . nitroGLYCERIN (NITROSTAT) 0.4 MG SL tablet   Sublingual   Place 0.4 mg under the tongue every 5 (five) minutes as needed for chest pain.          [name] omega-3 acid ethyl esters (LOVAZA) 1 G capsule   Oral   Take 1 g by mouth daily.         . pantoprazole (PROTONIX) 40 MG tablet   Oral   Take 40 mg by mouth 2 (two) times daily.          . polyethylene glycol (MIRALAX / GLYCOLAX) packet   Oral   Take 17 g by mouth daily as needed (for constipation).          . potassium chloride SA (K-DUR,KLOR-CON) 20 MEQ tablet   Oral   Take 20 mEq by mouth daily.         . raloxifene (EVISTA) 60 MG tablet   Oral   Take 60 mg by mouth daily.         [name] spironolactone (ALDACTONE) 25 MG tablet   Oral   Take 1 tablet (25 mg total) by mouth daily.   30 tablet   5    BP 137/73  Pulse 72  Temp(Src) 98.4 F (36.9 C) (Oral)  SpO2 97% Physical Exam  Nursing note and vitals reviewed. Constitutional: She is oriented to person, place, and time. She appears well-developed and well-nourished.   HENT:  Head: Normocephalic.  Mouth/Throat: Oropharynx is clear and moist. No oropharyngeal exudate.  Eyes: Conjunctivae and EOM are normal. Pupils are equal, round, and reactive to light.  Neck: Normal range of motion. Neck supple.  Cardiovascular: Normal rate, regular rhythm, normal heart sounds and intact distal pulses.  Exam reveals no gallop and no friction rub.   No murmur heard. Pulmonary/Chest: Effort normal and breath sounds normal. No respiratory distress. She has no wheezes.  Abdominal: Soft. Bowel sounds are normal. There is no tenderness. There is no rebound and no guarding.  Musculoskeletal: Normal range of motion. She exhibits no edema and no tenderness.  Neurological: She is alert and oriented to person, place, and time.  Skin: Skin is warm and dry.  Psychiatric: She has a normal mood and affect. Her behavior is normal.    ED Course  Procedures (including critical care time) Labs Review Labs Reviewed  CBC WITH DIFFERENTIAL - Abnormal; Notable for the following:    WBC 3.1 (*)    MCH 35.3 (*)    RDW 15.8 (*)    Neutro Abs 1.6 (*)    Monocytes  Relative 14 (*)    All other components within normal limits  COMPREHENSIVE METABOLIC PANEL - Abnormal; Notable for the following:    Glucose, Bld 105 (*)    Creatinine, Ser 1.14 (*)    Albumin 3.3 (*)    AST 132 (*)    ALT 41 (*)    GFR calc non Af Amer 50 (*)    GFR calc Af Amer 58 (*)    All other components within normal limits  PROTIME-INR - Abnormal; Notable for the following:    Prothrombin Time 18.4 (*)    INR 1.58 (*)    All other components within normal limits  PRO B NATRIURETIC PEPTIDE - Abnormal; Notable for the following:    Pro B Natriuretic peptide (BNP) 1404.0 (*)    All other components within normal limits  PROTIME-INR - Abnormal; Notable for the following:    Prothrombin Time 17.1 (*)    All other components within normal limits  TSH - Abnormal; Notable for the following:    TSH 15.206 (*)    All  other components within normal limits  COMPREHENSIVE METABOLIC PANEL - Abnormal; Notable for the following:    Glucose, Bld 143 (*)    Creatinine, Ser 1.12 (*)    Albumin 3.1 (*)    AST 122 (*)    ALT 40 (*)    GFR calc non Af Amer 51 (*)    GFR calc Af Amer 59 (*)    All other components within normal limits  PRO B NATRIURETIC PEPTIDE - Abnormal; Notable for the following:    Pro B Natriuretic peptide (BNP) 2015.0 (*)    All other components within normal limits  BASIC METABOLIC PANEL - Abnormal; Notable for the following:    Creatinine, Ser 1.12 (*)    Calcium 8.2 (*)    GFR calc non Af Amer 51 (*)    GFR calc Af Amer 59 (*)    All other components within normal limits  CBC - Abnormal; Notable for the following:    WBC 3.7 (*)    RBC 3.80 (*)    MCH 35.3 (*)    RDW 15.9 (*)    All other components within normal limits  GLUCOSE, CAPILLARY - Abnormal; Notable for the following:    Glucose-Capillary 135 (*)    All other components within normal limits  TROPONIN I  TROPONIN I  TROPONIN I  TROPONIN I  APTT  GLUCOSE, CAPILLARY  CBC WITH DIFFERENTIAL   Imaging Review Dg Chest Port 1 View  03/11/2013   CLINICAL DATA:  Chest pain and indigestion, history of hypertension and previous MI and CVA, status post CABG.  EXAM: PORTABLE CHEST - 1 VIEW  COMPARISON:  Chest x-ray of December 18, 2012  FINDINGS: The lungs are mildly hyperinflated which may be voluntary or could reflect underlying COPD. The cardiopericardial silhouette is mildly enlarged. There is mild tortuosity of the descending thoracic aorta. The pulmonary vascularity is not engorged. The patient has undergone kyphoplasty at 3 levels in the upper thoracic spine.  IMPRESSION: There is mild hyperinflation which may be voluntary or reflect underlying COPD. There is no evidence of pulmonary edema nor of pneumonia. There is mild stable enlargement of the cardiac silhouette.   Electronically Signed   By: David  [place]   On:  03/11/2013 16:27    EKG Interpretation   None       Date: 03/11/2013  Rate: 70  Rhythm: normal sinus rhythm  QRS Axis:  left  Intervals: normal  ST/T Wave abnormalities: nonspecific ST changes  Conduction Disutrbances:none  Narrative Interpretation: Non-specific ST changes in II, III, aVF  Old EKG Reviewed: changes noted   MDM   1. Chest pain   2. Atrial fibrillation and flutter   3. CAD (coronary artery disease)   4. Chronic anticoagulation    4:01 PM 62 y.o. female within extensive cardiac history who presents with chest tightness, belching, and right posterior shoulder pain which began today at approximately noon. Her symptoms have waxed and waned since then. She got aspirin in route and one nitroglycerin tablet. Her pain has decreased from a 6 to a 4. She is afebrile and vital signs are unremarkable here. She denies any shortness of breath. I discussed her EKG with the on-call code STEMI physician, do not think this is a stemi. Cards will come to see the pt. Will get labs, nitro, cxr in the mean time.   Cards to admit.     Junius Argyle, MD 03/12/13 1052

## 2013-03-11 NOTE — ED Notes (Signed)
Report given to Drinda Butts, RN in POD C.

## 2013-03-11 NOTE — ED Notes (Signed)
Pt from home.  Pt began having pain in her R shoulder blade at approximately 1200.  Pain then moved to middle of the chest and feels like pressure.  Pt also c/o indigestion with a lot of belching.  Pt states this feels like her previous MI.  Pt given 1 SL Nitro and 324mg  Aspirin by EMS with pain decreasing from 6/10 to 4/10.

## 2013-03-11 NOTE — H&P (Signed)
Admit date: 03/11/2013 Referring Physician Dr. Romeo Apple Primary Cardiologist Dr. Verdis Prime Chief complaint/reason for admission: chest pain  HPI: This is a 62yo AAF with a history of ASCAD, chronic systolic CHF, LV mural thrombus on chronic coumadin, CKD, HTN, AICD, PAF who presented to the ER with complaints of chest pain.  She states that around 12noon, while sitting watching TV, she started having Chest pressure and tightness that felt like severe indigestion along with stabbing pain in her right back.  She did not take any NTG.  She was very nauseated and became very diaphoretic.  She says it was a 6/10 in severity.  She called EMS and was given SL NTG with improvement and currently she says her pain is a 4/10.  She was also SOB with the discomfort.  She has not had any anginal pain in a while. She recently saw Dr. Graciela Husbands and was in atrial flutter and had has some bouts of rapid rhythm.  Since she had missed some doses of apixiban a TEE/DCCV was planned for this Wednesday.  There was also discussion of reassessing LVF in a few weeks to see if LVF is still reduced and make a decision regarding reimplantation of AICD.    PMH:    Past Medical History  Diagnosis Date  . Spinal stenosis   . Numbness of foot     left foot  . Diverticulitis     Status post partial colectomy 1/12 with reversal in July 2012  . Colitis, ischemic   . DJD (degenerative joint disease)     History of multiple surgeries to the back, shoulder and knee  . Ischemic cardiomyopathy     Echo 06/16/10: EF 25-30%, anteroseptal and apical hypokinesis, moderate AI, mild MR  . Chronic systolic heart failure   . CAD (coronary artery disease)     a.  Ant MI 8/03 with stenting of the LAD;  b. staged PCI with Cypher DES to OM1 8/03;  c. s/p Cypher DES to LAD 7/04;  d.  multiple cardiac caths in past (chronically abnl ECG);    e. cardiac catheterization 3/12: LAD stent patent, distal LAD 40-50%, small ostial D1 80%, ostial D2 60%,  OM-1 stent patent (20-30%), proximal RCA 50%, proximal to mid RCA 40-50%; f. cath 02/17/2011 - nonobs  . LV (left ventricular) mural thrombus     Chronic Coumadin therapy  . CKD (chronic kidney disease), stage III     creatinine:  1.3 in 4/12;    . Tobacco abuse     history  . GERD (gastroesophageal reflux disease)   . Hypertension   . Lower GI bleed     August 2011  . Hypothyroidism     TSH 0.19 3/14  . Aortic insufficiency   . Pseudoaneurysm     History of, right forearm  . Hyperlipidemia   . Compression fracture 07/04/11    L2  . 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 Dual implantable cardioverter-defibrillator BSx     Implant 2005, gen change 2005,14  . PAF (paroxysmal atrial fibrillation)   . Anatomical narrow angle of right eye   . Blood transfusion   . Anemia   . H/O hiatal hernia   . Stroke     History of TIA and possible CVA; hospitalized 2004; on coumadin  . Sinus bradycardia 03/05/2012  . Myocardial infarction   . CHF (congestive heart failure)   .  Automatic implantable cardioverter-defibrillator in situ     PSH:    Past Surgical History  Procedure Laterality Date  . Back surgery    . Coronary angioplasty with stent placement  10/2001; 09/2002  . Cardiac defibrillator placement  07/2003; 10/2004  . Lumbar laminectomy/decompression microdiscectomy  10/2001    L5-S1/E-chart  . Knee arthroscopy      left  . Thyroidectomy  1970's  . Total knee arthroplasty  11/2003    left  . X-stop implantation  12/2004    L3-4; L4-5  . Fixation kyphoplasty thoracic spine  07/2007    T3, 4, 6 compression fractures  . Shoulder open rotator cuff repair  04/2008    right  . Lumbar laminectomy/decompression microdiscectomy  01/2010  . Colectomy  03/2010    sigmoid left  . Colostomy  03/2010    transverse  . Peripherally inserted central catheter insertion  03/2010     removed upon discharge  . Colostomy closure  12/2010    reversal  . Tonsillectomy and adenoidectomy  1969  . Appendectomy  03/2010  . Cataract extraction      right  . Biv icd genertaor change out  12/17/2012  . Icd lead removal Left 12/17/2012    Procedure: ICD LEAD REMOVAL;  Surgeon: Marinus Maw, MD;  Location: [facility] OR;  Service: Cardiovascular;  Laterality: Left;    ALLERGIES:   Coconut oil; Lansoprazole; Penicillins; Statins; Lisinopril; and Lactose intolerance (gi)  Prior to Admit Meds:   (Not in a hospital admission) Family HX:    Family History  Problem Relation Age of Onset  . Diabetes Mother   . Diabetes Brother   . Arthritis      family history  . Prostate cancer      Family History   Social HX:    History   Social History  . Marital Status: Widowed    Spouse Name: N/A    Number of Children: N/A  . Years of Education: N/A   Occupational History  . Not on file.   Social History Main Topics  . Smoking status: Former Smoker -- 0.10 packs/day for 33 years    Types: Cigarettes    Quit date: 04/21/2001  . Smokeless tobacco: Never Used     Comment: "smoked ~ 1 pack/month"  . Alcohol Use: No     Comment: 07/05/11 "occasionally; last time was glass of wine last week"  . Drug Use: No  . Sexual Activity: Yes   Other Topics Concern  . Not on file   Social History Narrative  . No narrative on file     ROS:  All 11 ROS were addressed and are negative except what is stated in the HPI  PHYSICAL EXAM Filed Vitals:   03/11/13 1556  BP: 137/73  Pulse: 72  Temp: 98.4 F (36.9 C)   General: Well developed, well nourished, in no acute distress Head: Eyes PERRLA, No xanthomas.   Normal cephalic and atramatic  Lungs:   Clear bilaterally to auscultation and percussion. Heart:   HRRR S1 S2 Pulses are 2+ & equal.            No carotid bruit. No JVD.  No abdominal bruits. No femoral bruits. Abdomen: Bowel sounds are positive, abdomen soft and non-tender without  masses Extremities:   No clubbing, cyanosis or edema.  DP +1 Neuro: Alert and oriented X 3. Psych:  Good affect, responds appropriately   Labs:   Lab Results  Component Value Date  WBC 3.1* 03/11/2013   HGB 13.9 03/11/2013   HCT 39.3 03/11/2013   MCV 99.7 03/11/2013   PLT 181 03/11/2013    Recent Labs Lab 03/11/13 1600  NA 138  K 3.9  CL 101  CO2 25  BUN 15  CREATININE 1.14*  CALCIUM 8.5  PROT 7.2  BILITOT 0.6  ALKPHOS 76  ALT 41*  AST 132*  GLUCOSE 105*   Lab Results  Component Value Date   CKTOTAL 79 09/15/2011   CKMB 2.2 09/15/2011   TROPONINI <0.30 03/11/2013   No results found for this basename: PTT   Lab Results  Component Value Date   INR 1.58* 03/11/2013   INR 1.3 01/17/2013   INR 3.6 01/03/2013     Lab Results  Component Value Date   CHOL 219* 06/07/2012   CHOL 225* 12/15/2011   CHOL 209* 02/18/2011   Lab Results  Component Value Date   HDL 88 06/07/2012   HDL 87.20 12/15/2011   HDL 562 02/18/2011   Lab Results  Component Value Date   LDLCALC 119* 06/07/2012   LDLCALC 94 02/18/2011   LDLCALC 118* 02/17/2011   Lab Results  Component Value Date   TRIG 62 06/07/2012   TRIG 100.0 12/15/2011   TRIG 56 02/18/2011   Lab Results  Component Value Date   CHOLHDL 2.5 06/07/2012   CHOLHDL 3 12/15/2011   CHOLHDL 2.0 02/18/2011   Lab Results  Component Value Date   LDLDIRECT 126.3 12/15/2011      Radiology:  CLINICAL DATA: Chest pain and indigestion, history of hypertension  and previous MI and CVA, status post CABG.  EXAM:  PORTABLE CHEST - 1 VIEW  COMPARISON: Chest x-ray of December 18, 2012  FINDINGS:  The lungs are mildly hyperinflated which may be voluntary or could  reflect underlying COPD. The cardiopericardial silhouette is mildly  enlarged. There is mild tortuosity of the descending thoracic aorta.  The pulmonary vascularity is not engorged. The patient has undergone  kyphoplasty at 3 levels in the upper thoracic spine.   IMPRESSION:  There is mild hyperinflation which may be voluntary or reflect  underlying COPD. There is no evidence of pulmonary edema nor of  pneumonia. There is mild stable enlargement of the cardiac  silhouette.   EKG:  NSR with inferior infarct  ASSESSMENT:  1.  Chest pain currently 4/10 but appears comfortable. EKG shows old inferior infarct 2.  LV thrombus on epixiban 3.  PAFlutter with planned TEE/DCCV this Wed but now in NSR 4.  ASCAD 5.  HTN 6.  CKD stage III  PLAN:   1.  Admit to tele bed 2.  Cycle cardiac enzymes 3.  Continue home meds 4.  NPO after MN 5.  Possible Lexiscan myoview in am to assess for ischemia if enzymes negative -final decision per Dr. Flossie Dibble, MD  03/11/2013  4:58 PM

## 2013-03-12 ENCOUNTER — Other Ambulatory Visit: Payer: Self-pay

## 2013-03-12 DIAGNOSIS — N189 Chronic kidney disease, unspecified: Secondary | ICD-10-CM

## 2013-03-12 LAB — BASIC METABOLIC PANEL
Calcium: 8.2 mg/dL — ABNORMAL LOW (ref 8.4–10.5)
GFR calc Af Amer: 59 mL/min — ABNORMAL LOW (ref >90)
GFR calc non Af Amer: 51 mL/min — ABNORMAL LOW (ref >90)
Glucose, Bld: 81 mg/dL (ref 70–99)
Potassium: 4.1 mEq/L (ref 3.5–5.1)
Sodium: 135 mEq/L (ref 135–145)

## 2013-03-12 LAB — COMPREHENSIVE METABOLIC PANEL
AST: 122 U/L — ABNORMAL HIGH (ref 0–37)
BUN: 17 mg/dL (ref 6–23)
CO2: 22 mEq/L (ref 19–32)
Chloride: 101 mEq/L (ref 96–112)
Creatinine, Ser: 1.12 mg/dL — ABNORMAL HIGH (ref 0.50–1.10)
GFR calc Af Amer: 59 mL/min — ABNORMAL LOW (ref >90)
GFR calc non Af Amer: 51 mL/min — ABNORMAL LOW (ref >90)
Glucose, Bld: 143 mg/dL — ABNORMAL HIGH (ref 70–99)
Total Bilirubin: 0.5 mg/dL (ref 0.3–1.2)
Total Protein: 7 g/dL (ref 6.0–8.3)

## 2013-03-12 LAB — CBC
MCH: 35.3 pg — ABNORMAL HIGH (ref 26.0–34.0)
MCHC: 35.3 g/dL (ref 30.0–36.0)
MCV: 100 fL (ref 78.0–100.0)
Platelets: 172 10*3/uL (ref 150–400)
RBC: 3.8 MIL/uL — ABNORMAL LOW (ref 3.87–5.11)
RDW: 15.9 % — ABNORMAL HIGH (ref 11.5–15.5)

## 2013-03-12 LAB — PROTIME-INR
INR: 1.43 (ref 0.00–1.49)
Prothrombin Time: 17.1 seconds — ABNORMAL HIGH (ref 11.6–15.2)

## 2013-03-12 LAB — TROPONIN I
Troponin I: <0.3 ng/mL (ref <0.30)
Troponin I: <0.3 ng/mL (ref <0.30)

## 2013-03-12 LAB — APTT: aPTT: 34 seconds (ref 24–37)

## 2013-03-12 LAB — GLUCOSE, CAPILLARY
Glucose-Capillary: 135 mg/dL — ABNORMAL HIGH (ref 70–99)
Glucose-Capillary: 83 mg/dL (ref 70–99)

## 2013-03-12 LAB — TSH: TSH: 15.206 u[IU]/mL — ABNORMAL HIGH (ref 0.350–4.500)

## 2013-03-12 MED ORDER — INFLUENZA VAC SPLIT QUAD 0.5 ML IM SUSP
0.5000 mL | INTRAMUSCULAR | Status: AC
  Administered 2013-03-12: 16:00:00 0.5 mL via INTRAMUSCULAR
  Filled 2013-03-12: qty 0.5

## 2013-03-12 NOTE — Progress Notes (Signed)
Held spironolactone and lasix this morning due to low bp per Dr Anne Fu. Will continue to monitor pt closely.  Juliane Lack, RN

## 2013-03-12 NOTE — Progress Notes (Signed)
Discussed discharge instructions with pt including how and when to call the dr, follow up appts, and medications to take at home. Pt verbalized understanding and denied any questions.   Juliane Lack, RN

## 2013-03-12 NOTE — Progress Notes (Addendum)
Subjective:  Feels well. No further back/chest pain. Troponin normal.   Spoke to her at length with her son.   Extensive review of outpatient notes.   Objective:  Vital Signs in the last 24 hours: Temp:  [98.1 F (36.7 C)-98.8 F (37.1 C)] 98.1 F (36.7 C) (12/23 1034) Pulse Rate:  [56-72] 56 (12/23 0602) Resp:  [11-20] 20 (12/23 0454) BP: (89-149)/(37-73) 89/54 mmHg (12/23 1034) SpO2:  [96 %-99 %] 98 % (12/23 0454) Weight:  [139 lb 15.9 oz (63.5 kg)-141 lb 9.6 oz (64.229 kg)] 139 lb 15.9 oz (63.5 kg) (12/23 0454)  Intake/Output from previous day: 12/22 0701 - 12/23 0700 In: 480 [P.O.:480] Out: 400 [Urine:400]   Physical Exam: General: Elderly appearing, thin in no acute distress. Head:  Normocephalic and atraumatic. Lungs: Clear to auscultation and percussion. Heart: Normal S1 and S2/brady RR.  No murmur, rubs or gallops. Old ICD scar noted.  Abdomen: soft, non-tender, positive bowel sounds. Extremities: No clubbing or cyanosis. No edema. Neurologic: Alert and oriented x 3.    Lab Results:  Recent Labs  03/11/13 1600 03/12/13 0125  WBC 3.1* 3.7*  HGB 13.9 13.4  PLT 181 172    Recent Labs  03/11/13 2050 03/12/13 0125  NA 139 135  K 4.0 4.1  CL 101 98  CO2 22 23  GLUCOSE 143* 81  BUN 17 17  CREATININE 1.12* 1.12*    Recent Labs  03/12/13 0117 03/12/13 0807  TROPONINI <0.30 <0.30   Hepatic Function Panel  Recent Labs  03/11/13 2050  PROT 7.0  ALBUMIN 3.1*  AST 122*  ALT 40*  ALKPHOS 70  BILITOT 0.5   Imaging: Dg Chest Port 1 View  03/11/2013   CLINICAL DATA:  Chest pain and indigestion, history of hypertension and previous MI and CVA, status post CABG.  EXAM: PORTABLE CHEST - 1 VIEW  COMPARISON:  Chest x-ray of December 18, 2012  FINDINGS: The lungs are mildly hyperinflated which may be voluntary or could reflect underlying COPD. The cardiopericardial silhouette is mildly enlarged. There is mild tortuosity of the descending  thoracic aorta. The pulmonary vascularity is not engorged. The patient has undergone kyphoplasty at 3 levels in the upper thoracic spine.  IMPRESSION: There is mild hyperinflation which may be voluntary or reflect underlying COPD. There is no evidence of pulmonary edema nor of pneumonia. There is mild stable enlargement of the cardiac silhouette.   Electronically Signed   By: David  [place]   On: 03/11/2013 16:27   Personally viewed.   Telemetry: Sinus brady / NSR 50-60. NO AFIB Personally viewed.   EKG:  SB  Cardiac Studies:  2012 cath x 2, reviewed.   Left anterior descending (LAD): This is a large vessel. There is a stent in the mid vessel between the first and second diagonal branches which is widely patent. The mid to distal LAD is widely patent. There is a 40% stenosis of the takeoff of the second diagonal. The first diagonal has an 80% stenosis at its origin.   Left circumflex (LCx): This vessel gives rise to a large obtuse marginal vessel and then continues as a marginal branch on the lateral wall. The first obtuse marginal vessel has a stent which is widely patent. There is no significant disease in the circumflex distribution.   Right coronary artery (RCA): There is diffuse 30% disease in the proximal to mid vessel.  Left ventriculography: Was not performed due to concerns of her contrast load.   Final  Conclusions: 1. No significant obstructive coronary disease. Prior stents in the LAD and first obtuse marginal vessel are widely patent. There are no lesions to explain her chest pain. Her chest pain has currently resolved.   Assessment/Plan:  Active Problems:   Chest pain  1) Atypical CP - started in back and she admits that she has chronic back pain. Troponin serial are all normal. Reassuring. ECG with subtle diffuse ST elevation similar to several prior ECG's in system. ? If she had a brief episode of PAF at that time when she was feeling diaphoretic. She has battled this for quite  some time. See below. There is no availability for NUC stress today after checking and I do not feel strongly that this needs to be performed. She has diffuse non obstructive disease based on prior cath in 2012 with patent OM and LAD stents. Could have progression of disease but CP free now, normal troponin, explanation of chronic back pain, we will continue medical mgt.   2) Parox AFIB/ 2:1 atypical atrial flutter - Currently in NSR/SB. Last ECG on 12/17 with 2:1 flutter 125bpm.  Will cancel TEE/CV on sched for tomorrow. No longer on amiodarone because of elevated liver enzymes per pt. EF prior <35%. Limits choices in antiarrhythmics. ?Dofetilide. Discussed with Dr. Ladona Ridgel. I reviewed QTc ( ) and Creatinine with him. I have asked for consult now that she wishes to stay in the hospital over Christmas.   When looking at Dofetilide order, contraindicated with QTc >458ms. She no longer has ICD in place. Will defer to his decision on starting this medication.   3) Cardiomyopathy - EF prior <35%. Plan was to perform TEE/CV tomorrow and reassess EF 3 weeks post cardioversion (? If there could be a component of tachycardia induced cardiomyopathy). Since she is now in NSR/ SB, no need for TEE/CV. Will not check ECHO on this admit but will continue with plan to reassess in 3 weeks.   Prior ICD explant due to erosion.   4) Chronic anticoagulation - continue with Eliquis.   5) CAD - LAD and OM stenting. Prior cath reports noted.   Will make Dr. Katrinka Blazing aware.   [name] 03/12/2013, 10:58 AM

## 2013-03-12 NOTE — Progress Notes (Signed)
UR completed 

## 2013-03-12 NOTE — Discharge Summary (Signed)
Physician Discharge Summary  Patient ID: [name] MRN: [id number] DOB/AGE: 1950-04-10 62 y.o.  Admit date: 03/11/2013 Discharge date: 03/12/2013  Admission Diagnoses: Atypical chest pain  Discharge Diagnoses:  Active Problems:   Chest pain Paroxysmal atrial flutter/fibrillation Ischemic cardiomyopathy Chronic anticoagulation  Discharged Condition: Good  Hospital Course: 62 year old with previously diagnosed ischemic cardiomyopathy (LAD an OM stents) with previous ejection fraction less than 35% (25-30% 06/16/10 with moderate AI, mild MR) was admitted with atypical chest discomfort, diaphoresis last evening described as back pain which radiated to her chest. This occurred at rest while watching television and felt like severe indigestion and a stabbing pain in her right back. She was quite nauseous and became diaphoretic. Nitroglycerin may have helped. She states that she does have chronic back discomfort and this may have been the cause.  In regards to her atrial flutter, she was recent seen by Dr. Graciela Husbands on 03/06/13. At that time she was in atrial flutter with 2-1 conduction and a heart rate of 125 beats per minute. Her EKG prior to this in the end of October, October 30, also demonstrated atrial flutter with a heart rate of 125 beats per minute. When she got admitted to the hospital on 03/11/13, she was in sinus rhythm/sinus bradycardia. She did not demonstrate any further evidence of atrial flutter or fibrillation during this hospitalization.  Overall plan was to proceed with TEE cardioversion (she had missed previous anticoagulation doses) but obviously seen she has auto converted, she does not need this procedure which was scheduled on 03/13/13. She was going to have her ejection fraction reassessed in 3 weeks after cardioversion and have gone ahead and set up an echocardiogram on 1/19 at 2 PM. I did not get an echocardiogram during this admission because if she did have a contribution  of possible tachycardia induced cardiomyopathy, 3-4 days would not be adequate time for her ejection fraction to resolve. Her ejection fraction is important because this may guide possible further implantation of defibrillator.  In regards to her defibrillator, earlier this year she had her defibrillator explanted because of wound erosion.   Telemetry was personally viewed and showed no evidence of atrial flutter. EKG shows sinus bradycardia rate 56 with subtle ST segment elevation especially in the lateral leads but mostly diffuse which is not a significant change from prior review of multiple EKGs.  Her QTC was greater than 440 ms at 445 ms with a QT interval of 466 ms. This is important because I contemplated possible use of dofetilide to help suppress her atrial arrhythmia/atrial flutter. In the past, she had been on amiodarone but this was stopped because of elevated liver enzymes. Currently her AST is approximately 100 and ALT is 40.  After careful review personally with Dr. Ladona Ridgel, and review of criteria for dofetilide initiation, without defibrillator in place, use of dofetilide with QTc interval of greater than 440 ms is a contraindication. We decided not to proceed in this direction. Of course, ultimate decision I will defer to Dr. Graciela Husbands in the future. She does not have any other viable options as antiarrhythmic therapy.  Thankfully, she did auto convert to sinus rhythm. We will continue with low-dose carvedilol. We'll also continue with anticoagulation Eliquis 5 mg twice a day. Her creatinine is 1.12. Hemoglobin 13.4. Troponins are normal. Given that she had no significant change in her EKG, chest pain-free, atypical previous discomfort, cardiac catheterizations reviewed, I do not feel strongly that she requires stress testing at this time. Nuclear stress lab  was unavailable this morning. She may very well have advancement of her coronary disease however her history was quite atypical and she is  quite comfortable currently. Blood pressure is low normal however she is asymptomatic.   2012 cath x 2, reviewed.   Left anterior descending (LAD): This is a large vessel. There is a stent in the mid vessel between the first and second diagonal branches which is widely patent. The mid to distal LAD is widely patent. There is a 40% stenosis of the takeoff of the second diagonal. The first diagonal has an 80% stenosis at its origin.   Left circumflex (LCx): This vessel gives rise to a large obtuse marginal vessel and then continues as a marginal branch on the lateral wall. The first obtuse marginal vessel has a stent which is widely patent. There is no significant disease in the circumflex distribution.   Right coronary artery (RCA): There is diffuse 30% disease in the proximal to mid vessel.  Left ventriculography: Was not performed due to concerns of her contrast load.   Final Conclusions: 1. No significant obstructive coronary disease. Prior stents in the LAD and first obtuse marginal vessel are widely patent. There are no lesions to explain her chest pain. Her chest pain has currently resolved.  Consults: I discussed her case with Dr. Lewayne Bunting   Discharge Exam: Blood pressure 92/43, pulse 52, temperature 98.4 F (36.9 C), temperature source Oral, resp. rate 18, height 5\' 6"  (1.676 m), weight 139 lb 15.9 oz (63.5 kg), SpO2 100.00%.  General: Alert and oriented x3 no acute distress, happy, son in room Cardiovascular: Bradycardic regular rhythm, soft systolic murmur at apex, no JVD Lungs: Clear to auscultation bilaterally normal respiratory effort Abdomen: Soft, nontender, normal bowel sounds Extremities: No edema Chest wall: Prior ICD site clean dry and intact, scar noted.  Disposition: 01-Home or Self Care  Discharge Orders   Future Appointments Provider Department Dept Phone   04/01/2013 3:15 PM Duke Salvia, MD [facility] [place] Office [phone]   04/08/2013 2:00 PM  [facility] Echo Echo 2 [facility] S[facility] 3 ECHO LAB [id number]   Future Orders Complete By Expires   Diet - low sodium heart healthy  As directed    Increase activity slowly  As directed        Medication List         apixaban 5 MG Tabs tablet  Commonly known as:  ELIQUIS  Take 1 tablet (5 mg total) by mouth 2 (two) times daily.     carvedilol 6.25 MG tablet  Commonly known as:  COREG  Take 6.25 mg by mouth 2 (two) times daily with a meal.     ezetimibe 10 MG tablet  Commonly known as:  ZETIA  Take 10 mg by mouth at bedtime.     fexofenadine 180 MG tablet  Commonly known as:  ALLEGRA  Take 180 mg by mouth daily as needed for allergies.     fluticasone 50 MCG/ACT nasal spray  Commonly known as:  FLONASE  Place 2 sprays into both nostrils daily as needed for allergies.     folic acid-pyridoxine-cyancobalamin 2.5-25-2 MG Tabs  Commonly known as:  FOLTX  Take 1 tablet by mouth daily.     furosemide 20 MG tablet  Commonly known as:  LASIX  Take 20 mg by mouth daily.     HYDROcodone-acetaminophen 7.5-325 MG per tablet  Commonly known as:  NORCO  Take 1 tablet by mouth every  8 (eight) hours as needed for pain.     levothyroxine 200 MCG tablet  Commonly known as:  SYNTHROID, LEVOTHROID  Take 200 mcg by mouth daily before breakfast.     methocarbamol 500 MG tablet  Commonly known as:  ROBAXIN  Take 500 mg by mouth 4 (four) times daily as needed (for spasms).     nitroGLYCERIN 0.4 MG SL tablet  Commonly known as:  NITROSTAT  Place 0.4 mg under the tongue every 5 (five) minutes as needed for chest pain.     omega-3 acid ethyl esters 1 G capsule  Commonly known as:  LOVAZA  Take 1 g by mouth daily.     pantoprazole 40 MG tablet  Commonly known as:  PROTONIX  Take 40 mg by mouth 2 (two) times daily as needed (gerd).     polyethylene glycol packet  Commonly known as:  MIRALAX / GLYCOLAX  Take 17 g by mouth daily as needed (for constipation).      potassium chloride SA 20 MEQ tablet  Commonly known as:  K-DUR,KLOR-CON  Take 20 mEq by mouth daily.     raloxifene 60 MG tablet  Commonly known as:  EVISTA  Take 60 mg by mouth daily.     spironolactone 25 MG tablet  Commonly known as:  ALDACTONE  Take 1 tablet (25 mg total) by mouth daily.       Total time spent total time spent today with her approximately an hour and 15 minutes, discussion with her, son, Dr. Ladona Ridgel, review of medical records, review of lab work.  Echocardiogram to reassess ejection fraction is scheduled in 3 weeks.  If worsening of symptoms occur, she knows to seek medical attention.  SignedDonato Schultz 03/12/2013, 2:35 PM

## 2013-03-13 ENCOUNTER — Ambulatory Visit (HOSPITAL_COMMUNITY): Admission: RE | Admit: 2013-03-13 | Payer: Medicare Other | Source: Ambulatory Visit | Admitting: Internal Medicine

## 2013-03-13 ENCOUNTER — Encounter (HOSPITAL_COMMUNITY): Admission: RE | Payer: Self-pay | Source: Ambulatory Visit

## 2013-03-13 ENCOUNTER — Encounter (HOSPITAL_COMMUNITY): Payer: Self-pay

## 2013-03-13 ENCOUNTER — Encounter (HOSPITAL_COMMUNITY): Payer: Self-pay | Admitting: Certified Registered"

## 2013-03-13 ENCOUNTER — Ambulatory Visit (HOSPITAL_COMMUNITY): Admit: 2013-03-13 | Payer: Self-pay | Admitting: Internal Medicine

## 2013-03-13 SURGERY — ECHOCARDIOGRAM, TRANSESOPHAGEAL
Anesthesia: Moderate Sedation

## 2013-03-28 ENCOUNTER — Telehealth: Payer: Self-pay | Admitting: Internal Medicine

## 2013-03-28 NOTE — Telephone Encounter (Signed)
New message    Have we received the life vest renewal certification?

## 2013-03-28 NOTE — Telephone Encounter (Signed)
Yes  And sent in

## 2013-04-01 ENCOUNTER — Ambulatory Visit (INDEPENDENT_AMBULATORY_CARE_PROVIDER_SITE_OTHER): Payer: Medicare Other | Admitting: Internal Medicine

## 2013-04-01 ENCOUNTER — Encounter: Payer: Self-pay | Admitting: Internal Medicine

## 2013-04-01 VITALS — BP 125/71 | HR 107 | Ht 66.0 in | Wt 145.1 lb

## 2013-04-01 DIAGNOSIS — I4891 Unspecified atrial fibrillation: Secondary | ICD-10-CM

## 2013-04-01 DIAGNOSIS — I255 Ischemic cardiomyopathy: Secondary | ICD-10-CM

## 2013-04-01 DIAGNOSIS — I5022 Chronic systolic (congestive) heart failure: Secondary | ICD-10-CM

## 2013-04-01 DIAGNOSIS — I2589 Other forms of chronic ischemic heart disease: Secondary | ICD-10-CM

## 2013-04-01 NOTE — Patient Instructions (Addendum)
Remote monitoring is used to monitor your Pacemaker of ICD from home. This monitoring reduces the number of office visits required to check your device to one time per year. It allows [place] to keep an eye on the functioning of your device to ensure it is working properly. You are scheduled for a device check from home on 07/04/2013. You may send your transmission at any time that day. If you have a wireless device, the transmission will be sent automatically. After your physician reviews your transmission, you will receive a postcard with your next transmission date.   Your physician wants you to follow-up in: 1 year with Dr. Caryl Comes.  You will receive a reminder letter in the mail two months in advance. If you don't receive a letter, please call our office to schedule the follow-up appointment.   Dr. Caryl Comes will call you on Wednesday.

## 2013-04-01 NOTE — Progress Notes (Signed)
Patient Care Team: Janith Lima, MD as PCP - General Minus Breeding, MD (Cardiology)   HPI  [name] is a 63 y.o. female Seen in followup for an ICD implanted for primary prevention and for which she underwent device revision in 2006. She underwent repeat operation by Dr. 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 She underwent device extraction September 2014 And was discharged on a LifeVest  Echocardiogram 2012 demonstrated significant left ventricular dysfunction with an EF of 25%.   She was hospitalized over Dec 2014 with spontaneous reversion to sinus and then has reverted to atrial arrhythmia   She has struggled with sob and exercise ith reversion to atrial arrhythmia  Her QT in hospital was excessive and precluded dofetilide Amiodarone has in the past has been assoc with hepatotoxicity   No recent echo      Past Medical History  Diagnosis Date  . Spinal stenosis   . Numbness of foot     left foot  . Diverticulitis     Status post partial colectomy 1/12 with reversal in July 2012  . Colitis, ischemic   . DJD (degenerative joint disease)     History of multiple surgeries to the back, shoulder and knee  . Ischemic cardiomyopathy     Echo 06/16/10: EF 25-30%, anteroseptal and apical hypokinesis, moderate AI, mild MR  . Chronic systolic heart failure   . CAD (coronary artery disease)     a.  Ant MI 8/03 with stenting of the LAD;  b. staged PCI with Cypher DES to OM1 8/03;  c. s/p Cypher DES to LAD 7/04;  d.  multiple cardiac caths in past (chronically abnl ECG);    e. cardiac catheterization 3/12: LAD stent patent, distal LAD 40-50%, small ostial D1 80%, ostial D2 60%, OM-1 stent patent (20-30%), proximal RCA 50%, proximal to mid RCA 40-50%; f. cath 02/17/2011 - nonobs  . LV (left ventricular) mural thrombus     Chronic Coumadin therapy  . CKD (chronic kidney disease), stage III     creatinine:  1.3 in 4/12;    . Tobacco abuse      history  . GERD (gastroesophageal reflux disease)   . Hypertension   . Lower GI bleed     August 2011  . Hypothyroidism     TSH 0.19 3/14  . Aortic insufficiency   . Pseudoaneurysm     History of, right forearm  . Hyperlipidemia   . Compression fracture 07/04/11    L2  . 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 Dual implantable cardioverter-defibrillator BSx     Implant 2005, gen change 2005,14  . PAF (paroxysmal atrial fibrillation)   . Anatomical narrow angle of right eye   . Blood transfusion   . Anemia   . H/O hiatal hernia   . Stroke     History of TIA and possible CVA; hospitalized 2004; on coumadin  . Sinus bradycardia 03/05/2012  . Myocardial infarction   . CHF (congestive heart failure)   . Automatic implantable cardioverter-defibrillator in situ     Past Surgical History  Procedure Laterality Date  . Back surgery    . Coronary angioplasty with stent placement  10/2001; 09/2002  . Cardiac defibrillator placement  07/2003; 10/2004  . Lumbar laminectomy/decompression microdiscectomy  10/2001    L5-S1/E-chart  . Knee arthroscopy  left  . Thyroidectomy  1970's  . Total knee arthroplasty  11/2003    left  . X-stop implantation  12/2004    L3-4; L4-5  . Fixation kyphoplasty thoracic spine  07/2007    T3, 4, 6 compression fractures  . Shoulder open rotator cuff repair  04/2008    right  . Lumbar laminectomy/decompression microdiscectomy  01/2010  . Colectomy  03/2010    sigmoid left  . Colostomy  03/2010    transverse  . Peripherally inserted central catheter insertion  03/2010    removed upon discharge  . Colostomy closure  12/2010    reversal  . Tonsillectomy and adenoidectomy  1969  . Appendectomy  03/2010  . Cataract extraction      right  . Biv icd genertaor change out  12/17/2012  . Icd lead removal Left 12/17/2012    Procedure: ICD LEAD  REMOVAL;  Surgeon: Evans Lance, MD;  Location: [facility] OR;  Service: Cardiovascular;  Laterality: Left;    Current Outpatient Prescriptions  Medication Sig Dispense Refill  . apixaban (ELIQUIS) 5 MG TABS tablet Take 1 tablet (5 mg total) by mouth 2 (two) times daily.  60 tablet  5  . carvedilol (COREG) 6.25 MG tablet Take 6.25 mg by mouth 2 (two) times daily with a meal.      . ezetimibe (ZETIA) 10 MG tablet Take 10 mg by mouth at bedtime.       . fexofenadine (ALLEGRA) 180 MG tablet Take 180 mg by mouth daily as needed for allergies.       . fluticasone (FLONASE) 50 MCG/ACT nasal spray Place 2 sprays into both nostrils daily as needed for allergies.       . folic acid-pyridoxine-cyancobalamin (FOLTX) 2.5-25-2 MG TABS Take 1 tablet by mouth daily.       . furosemide (LASIX) 20 MG tablet Take 20 mg by mouth daily.      [name] HYDROcodone-acetaminophen (NORCO) 7.5-325 MG per tablet Take 1 tablet by mouth every 8 (eight) hours as needed for pain.      [name] levothyroxine (SYNTHROID, LEVOTHROID) 200 MCG tablet Take 200 mcg by mouth daily before breakfast.       . methocarbamol (ROBAXIN) 500 MG tablet Take 500 mg by mouth 4 (four) times daily as needed (for spasms).       . nitroGLYCERIN (NITROSTAT) 0.4 MG SL tablet Place 0.4 mg under the tongue every 5 (five) minutes as needed for chest pain.       [name] omega-3 acid ethyl esters (LOVAZA) 1 G capsule Take 1 g by mouth daily.      . pantoprazole (PROTONIX) 40 MG tablet Take 40 mg by mouth 2 (two) times daily as needed (gerd).       . polyethylene glycol (MIRALAX / GLYCOLAX) packet Take 17 g by mouth daily as needed (for constipation).       . potassium chloride SA (K-DUR,KLOR-CON) 20 MEQ tablet Take 20 mEq by mouth daily.      . raloxifene (EVISTA) 60 MG tablet Take 60 mg by mouth daily.      [name] spironolactone (ALDACTONE) 25 MG tablet Take 1 tablet (25 mg total) by mouth daily.  30 tablet  5   No current facility-administered medications for this visit.     Allergies  Allergen Reactions  . Coconut Oil Anaphylaxis and Hives  . Lansoprazole Nausea Only  . Penicillins Swelling    Throat swells  . Statins Other (See Comments)  cramps  . Lisinopril     Cough- but still tolerates it  . Lactose Intolerance (Gi) Diarrhea and Other (See Comments)    Patient reports abdominal cramping and diarrhea with lactose products.     Review of Systems negative except from HPI and PMH  Physical Exam BP 125/71  Pulse 107  Ht 5\' 6"  (1.676 m)  Wt 145 lb 1.9 oz (65.826 kg)  BMI 23.43 kg/m2 Well developed and nourished in no acute distress HENT normal Neck supple with JVP->10 Clear Regular rate and rhythm, no murmurs or gallops Abd-soft with active BS No Clubbing cyanosis tr edema Skin-warm and dry A & Oriented  Grossly normal sensory and motor function  Atrial aflutter   Atypical  Upright flutter waves V1 with no discernible flutter waves inferiorly  Assessment and  Plan

## 2013-04-02 NOTE — Assessment & Plan Note (Signed)
Worsening heart failure in the setting of atrial arrhythmia with rapid response.  She has few options  I have reviewed the tracings with JA and GT and e will refer for catheter ablation  Rate control has not been successful  The other alternative would be CRT implanttation and AV ablation  Will add digoxin in the interim

## 2013-04-05 ENCOUNTER — Other Ambulatory Visit: Payer: Self-pay | Admitting: Interventional Cardiology

## 2013-04-05 DIAGNOSIS — I5022 Chronic systolic (congestive) heart failure: Secondary | ICD-10-CM

## 2013-04-08 ENCOUNTER — Encounter: Payer: Self-pay | Admitting: Cardiovascular Disease

## 2013-04-08 ENCOUNTER — Ambulatory Visit (HOSPITAL_COMMUNITY): Payer: Medicare Other | Attending: Cardiovascular Disease | Admitting: Radiology

## 2013-04-08 DIAGNOSIS — I2589 Other forms of chronic ischemic heart disease: Secondary | ICD-10-CM

## 2013-04-08 DIAGNOSIS — I059 Rheumatic mitral valve disease, unspecified: Secondary | ICD-10-CM | POA: Insufficient documentation

## 2013-04-08 DIAGNOSIS — I359 Nonrheumatic aortic valve disorder, unspecified: Secondary | ICD-10-CM | POA: Insufficient documentation

## 2013-04-08 DIAGNOSIS — I2510 Atherosclerotic heart disease of native coronary artery without angina pectoris: Secondary | ICD-10-CM

## 2013-04-08 DIAGNOSIS — I4891 Unspecified atrial fibrillation: Secondary | ICD-10-CM

## 2013-04-08 DIAGNOSIS — Z95810 Presence of automatic (implantable) cardiac defibrillator: Secondary | ICD-10-CM | POA: Insufficient documentation

## 2013-04-08 DIAGNOSIS — I5022 Chronic systolic (congestive) heart failure: Secondary | ICD-10-CM

## 2013-04-08 DIAGNOSIS — I252 Old myocardial infarction: Secondary | ICD-10-CM | POA: Insufficient documentation

## 2013-04-08 DIAGNOSIS — I4892 Unspecified atrial flutter: Secondary | ICD-10-CM

## 2013-04-08 DIAGNOSIS — I509 Heart failure, unspecified: Secondary | ICD-10-CM | POA: Insufficient documentation

## 2013-04-08 NOTE — Progress Notes (Signed)
Echocardiogram performed.  

## 2013-04-10 ENCOUNTER — Other Ambulatory Visit: Payer: Self-pay

## 2013-04-10 ENCOUNTER — Encounter: Payer: Medicare Other | Admitting: Internal Medicine

## 2013-04-10 ENCOUNTER — Encounter (HOSPITAL_COMMUNITY): Payer: Self-pay | Admitting: Emergency Medicine

## 2013-04-10 ENCOUNTER — Telehealth: Payer: Self-pay

## 2013-04-10 ENCOUNTER — Emergency Department (HOSPITAL_COMMUNITY): Payer: Medicare Other

## 2013-04-10 ENCOUNTER — Observation Stay (HOSPITAL_COMMUNITY)
Admission: EM | Admit: 2013-04-10 | Discharge: 2013-04-11 | Disposition: A | Discharge status: Home / Self Care | Payer: Medicare Other | Attending: Cardiology | Admitting: Cardiology

## 2013-04-10 DIAGNOSIS — M1990 Unspecified osteoarthritis, unspecified site: Secondary | ICD-10-CM

## 2013-04-10 DIAGNOSIS — E785 Hyperlipidemia, unspecified: Secondary | ICD-10-CM

## 2013-04-10 DIAGNOSIS — N189 Chronic kidney disease, unspecified: Secondary | ICD-10-CM

## 2013-04-10 DIAGNOSIS — R058 Other specified cough: Secondary | ICD-10-CM

## 2013-04-10 DIAGNOSIS — I5022 Chronic systolic (congestive) heart failure: Secondary | ICD-10-CM

## 2013-04-10 DIAGNOSIS — R079 Chest pain, unspecified: Principal | ICD-10-CM | POA: Insufficient documentation

## 2013-04-10 DIAGNOSIS — N390 Urinary tract infection, site not specified: Secondary | ICD-10-CM

## 2013-04-10 DIAGNOSIS — I10 Essential (primary) hypertension: Secondary | ICD-10-CM

## 2013-04-10 DIAGNOSIS — R05 Cough: Secondary | ICD-10-CM

## 2013-04-10 DIAGNOSIS — I129 Hypertensive chronic kidney disease with stage 1 through stage 4 chronic kidney disease, or unspecified chronic kidney disease: Secondary | ICD-10-CM | POA: Insufficient documentation

## 2013-04-10 DIAGNOSIS — N183 Chronic kidney disease, stage 3 unspecified: Secondary | ICD-10-CM | POA: Insufficient documentation

## 2013-04-10 DIAGNOSIS — M4800 Spinal stenosis, site unspecified: Secondary | ICD-10-CM | POA: Insufficient documentation

## 2013-04-10 DIAGNOSIS — I255 Ischemic cardiomyopathy: Secondary | ICD-10-CM

## 2013-04-10 DIAGNOSIS — I480 Paroxysmal atrial fibrillation: Secondary | ICD-10-CM | POA: Diagnosis present

## 2013-04-10 DIAGNOSIS — T464X5A Adverse effect of angiotensin-converting-enzyme inhibitors, initial encounter: Secondary | ICD-10-CM

## 2013-04-10 DIAGNOSIS — N898 Other specified noninflammatory disorders of vagina: Secondary | ICD-10-CM

## 2013-04-10 DIAGNOSIS — S32029A Unspecified fracture of second lumbar vertebra, initial encounter for closed fracture: Secondary | ICD-10-CM

## 2013-04-10 DIAGNOSIS — I4892 Unspecified atrial flutter: Secondary | ICD-10-CM | POA: Insufficient documentation

## 2013-04-10 DIAGNOSIS — I2589 Other forms of chronic ischemic heart disease: Secondary | ICD-10-CM | POA: Insufficient documentation

## 2013-04-10 DIAGNOSIS — Z7901 Long term (current) use of anticoagulants: Secondary | ICD-10-CM

## 2013-04-10 DIAGNOSIS — K219 Gastro-esophageal reflux disease without esophagitis: Secondary | ICD-10-CM | POA: Insufficient documentation

## 2013-04-10 DIAGNOSIS — E039 Hypothyroidism, unspecified: Secondary | ICD-10-CM | POA: Insufficient documentation

## 2013-04-10 DIAGNOSIS — I6529 Occlusion and stenosis of unspecified carotid artery: Secondary | ICD-10-CM

## 2013-04-10 DIAGNOSIS — I2510 Atherosclerotic heart disease of native coronary artery without angina pectoris: Secondary | ICD-10-CM

## 2013-04-10 DIAGNOSIS — R300 Dysuria: Secondary | ICD-10-CM

## 2013-04-10 DIAGNOSIS — I639 Cerebral infarction, unspecified: Secondary | ICD-10-CM

## 2013-04-10 DIAGNOSIS — I4891 Unspecified atrial fibrillation: Secondary | ICD-10-CM | POA: Insufficient documentation

## 2013-04-10 DIAGNOSIS — Z87891 Personal history of nicotine dependence: Secondary | ICD-10-CM | POA: Insufficient documentation

## 2013-04-10 HISTORY — DX: Nonrheumatic mitral (valve) insufficiency: I34.0

## 2013-04-10 HISTORY — DX: Unspecified atrial flutter: I48.92

## 2013-04-10 LAB — CBC WITH DIFFERENTIAL/PLATELET
Basophils Absolute: 0 10*3/uL (ref 0.0–0.1)
Basophils Relative: 0 % (ref 0–1)
EOS ABS: 0.1 10*3/uL (ref 0.0–0.7)
EOS PCT: 2 % (ref 0–5)
HCT: 43 % (ref 36.0–46.0)
HEMOGLOBIN: 15 g/dL (ref 12.0–15.0)
Lymphocytes Relative: 38 % (ref 12–46)
Lymphs Abs: 1.2 10*3/uL (ref 0.7–4.0)
MCH: 35 pg — AB (ref 26.0–34.0)
MCHC: 34.9 g/dL (ref 30.0–36.0)
MCV: 100.5 fL — ABNORMAL HIGH (ref 78.0–100.0)
MONOS PCT: 25 % — AB (ref 3–12)
Monocytes Absolute: 0.8 10*3/uL (ref 0.1–1.0)
NEUTROS PCT: 36 % — AB (ref 43–77)
Neutro Abs: 1.2 10*3/uL — ABNORMAL LOW (ref 1.7–7.7)
Platelets: 138 10*3/uL — ABNORMAL LOW (ref 150–400)
RBC: 4.28 MIL/uL (ref 3.87–5.11)
RDW: 16.4 % — ABNORMAL HIGH (ref 11.5–15.5)
WBC: 3.2 10*3/uL — ABNORMAL LOW (ref 4.0–10.5)

## 2013-04-10 LAB — COMPREHENSIVE METABOLIC PANEL
ALT: 35 U/L (ref 0–35)
AST: 76 U/L — ABNORMAL HIGH (ref 0–37)
Albumin: 3.4 g/dL — ABNORMAL LOW (ref 3.5–5.2)
Alkaline Phosphatase: 74 U/L (ref 39–117)
BILIRUBIN TOTAL: 0.7 mg/dL (ref 0.3–1.2)
BUN: 14 mg/dL (ref 6–23)
CALCIUM: 9.1 mg/dL (ref 8.4–10.5)
CHLORIDE: 103 meq/L (ref 96–112)
CO2: 23 meq/L (ref 19–32)
CREATININE: 1.04 mg/dL (ref 0.50–1.10)
GFR, EST AFRICAN AMERICAN: 65 mL/min — AB (ref >90)
GFR, EST NON AFRICAN AMERICAN: 56 mL/min — AB (ref >90)
GLUCOSE: 120 mg/dL — AB (ref 70–99)
Potassium: 4.5 mEq/L (ref 3.7–5.3)
Sodium: 141 mEq/L (ref 137–147)
Total Protein: 7.2 g/dL (ref 6.0–8.3)

## 2013-04-10 LAB — TROPONIN I
Troponin I: <0.3 ng/mL (ref <0.30)
Troponin I: <0.3 ng/mL (ref <0.30)

## 2013-04-10 LAB — PRO B NATRIURETIC PEPTIDE: Pro B Natriuretic peptide (BNP): 3238 pg/mL — ABNORMAL HIGH (ref 0–125)

## 2013-04-10 LAB — APTT: APTT: 29 s (ref 24–37)

## 2013-04-10 LAB — MAGNESIUM: Magnesium: 1.8 mg/dL (ref 1.5–2.5)

## 2013-04-10 LAB — PROTIME-INR
INR: 1.14 (ref 0.00–1.49)
PROTHROMBIN TIME: 14.4 s (ref 11.6–15.2)

## 2013-04-10 MED ORDER — SODIUM CHLORIDE 0.9 % IJ SOLN: 3.0000 mL | INTRAMUSCULAR | Status: DC | PRN

## 2013-04-10 MED ORDER — ONDANSETRON HCL 4 MG/2ML IJ SOLN: 4.0000 mg | Freq: Four times a day (QID) | INTRAMUSCULAR | Status: DC | PRN

## 2013-04-10 MED ORDER — SODIUM CHLORIDE 0.9 % IV SOLN: 250.0000 mL | INTRAVENOUS | Status: DC | PRN

## 2013-04-10 MED ORDER — SPIRONOLACTONE 25 MG PO TABS
25.0000 mg | ORAL_TABLET | Freq: Every day | ORAL | Status: DC
  Administered 2013-04-11 (×2): 25 mg via ORAL
  Filled 2013-04-10 (×2): qty 1

## 2013-04-10 MED ORDER — METHOCARBAMOL 500 MG PO TABS
500.0000 mg | ORAL_TABLET | Freq: Four times a day (QID) | ORAL | Status: DC | PRN
  Filled 2013-04-10: qty 1

## 2013-04-10 MED ORDER — LEVOTHYROXINE SODIUM 200 MCG PO TABS
200.0000 ug | ORAL_TABLET | Freq: Every day | ORAL | Status: DC
  Administered 2013-04-11: 200 ug via ORAL
  Filled 2013-04-10 (×2): qty 1

## 2013-04-10 MED ORDER — SODIUM CHLORIDE 0.9 % IV BOLUS (SEPSIS)
1000.0000 mL | Freq: Once | INTRAVENOUS | Status: AC
  Administered 2013-04-10: 1000 mL via INTRAVENOUS

## 2013-04-10 MED ORDER — NITROGLYCERIN IN D5W 200-5 MCG/ML-% IV SOLN
5.0000 ug/min | INTRAVENOUS | Status: DC
  Administered 2013-04-10: 5 ug/min via INTRAVENOUS

## 2013-04-10 MED ORDER — APIXABAN 5 MG PO TABS
5.0000 mg | ORAL_TABLET | Freq: Two times a day (BID) | ORAL | Status: DC
  Administered 2013-04-10 – 2013-04-11 (×2): 5 mg via ORAL
  Filled 2013-04-10 (×3): qty 1

## 2013-04-10 MED ORDER — PANTOPRAZOLE SODIUM 40 MG PO TBEC: 40.0000 mg | DELAYED_RELEASE_TABLET | Freq: Two times a day (BID) | ORAL | Status: DC | PRN

## 2013-04-10 MED ORDER — DILTIAZEM HCL 25 MG/5ML IV SOLN
15.0000 mg | INTRAVENOUS | Status: DC | PRN
  Administered 2013-04-10: 15 mg via INTRAVENOUS
  Filled 2013-04-10: qty 5

## 2013-04-10 MED ORDER — CARVEDILOL 6.25 MG PO TABS
6.2500 mg | ORAL_TABLET | Freq: Two times a day (BID) | ORAL | Status: DC
  Administered 2013-04-10 – 2013-04-11 (×2): 6.25 mg via ORAL
  Filled 2013-04-10 (×4): qty 1

## 2013-04-10 MED ORDER — FUROSEMIDE 20 MG PO TABS
20.0000 mg | ORAL_TABLET | Freq: Every day | ORAL | Status: DC
  Administered 2013-04-11 (×2): 20 mg via ORAL
  Filled 2013-04-10 (×2): qty 1

## 2013-04-10 MED ORDER — FA-PYRIDOXINE-CYANOCOBALAMIN 2.5-25-2 MG PO TABS
1.0000 | ORAL_TABLET | Freq: Every day | ORAL | Status: DC
  Administered 2013-04-10 – 2013-04-11 (×2): 1 via ORAL
  Filled 2013-04-10 (×2): qty 1

## 2013-04-10 MED ORDER — RALOXIFENE HCL 60 MG PO TABS
60.0000 mg | ORAL_TABLET | Freq: Every day | ORAL | Status: DC
Start: 2013-04-10 — End: 2013-04-11
  Administered 2013-04-10 – 2013-04-11 (×2): 60 mg via ORAL
  Filled 2013-04-10 (×2): qty 1

## 2013-04-10 MED ORDER — ACETAMINOPHEN 325 MG PO TABS: 650.0000 mg | ORAL_TABLET | ORAL | Status: DC | PRN

## 2013-04-10 MED ORDER — POLYETHYLENE GLYCOL 3350 17 G PO PACK
17.0000 g | PACK | Freq: Every day | ORAL | Status: DC | PRN
  Filled 2013-04-10: qty 1

## 2013-04-10 MED ORDER — SPIRONOLACTONE 25 MG PO TABS: 25.0000 mg | ORAL_TABLET | Freq: Every day | ORAL | Status: DC

## 2013-04-10 MED ORDER — OMEGA-3-ACID ETHYL ESTERS 1 G PO CAPS
1.0000 g | ORAL_CAPSULE | Freq: Every day | ORAL | Status: DC
  Administered 2013-04-10 – 2013-04-11 (×2): 1 g via ORAL
  Filled 2013-04-10 (×2): qty 1

## 2013-04-10 MED ORDER — NITROGLYCERIN IN D5W 200-5 MCG/ML-% IV SOLN
5.0000 ug/min | Freq: Once | INTRAVENOUS | Status: AC
  Administered 2013-04-10: 5 ug/min via INTRAVENOUS
  Filled 2013-04-10: qty 250

## 2013-04-10 MED ORDER — SODIUM CHLORIDE 0.9 % IJ SOLN
3.0000 mL | Freq: Two times a day (BID) | INTRAMUSCULAR | Status: DC
  Administered 2013-04-10 – 2013-04-11 (×2): 3 mL via INTRAVENOUS

## 2013-04-10 MED ORDER — NITROGLYCERIN 0.4 MG SL SUBL: 0.4000 mg | SUBLINGUAL_TABLET | SUBLINGUAL | Status: DC | PRN

## 2013-04-10 MED ORDER — LORATADINE 10 MG PO TABS
10.0000 mg | ORAL_TABLET | Freq: Every day | ORAL | Status: DC | PRN
  Filled 2013-04-10: qty 1

## 2013-04-10 MED ORDER — EZETIMIBE 10 MG PO TABS
10.0000 mg | ORAL_TABLET | Freq: Every day | ORAL | Status: DC
  Administered 2013-04-10: 10 mg via ORAL
  Filled 2013-04-10 (×2): qty 1

## 2013-04-10 MED ORDER — FLUTICASONE PROPIONATE 50 MCG/ACT NA SUSP: 2.0000 | Freq: Every day | NASAL | Status: DC | PRN

## 2013-04-10 MED ORDER — HYDROCODONE-ACETAMINOPHEN 7.5-325 MG PO TABS: 1.0000 | ORAL_TABLET | Freq: Three times a day (TID) | ORAL | Status: DC | PRN

## 2013-04-10 NOTE — Telephone Encounter (Signed)
Message copied by Lamar Laundry on Wed Apr 10, 2013  9:30 AM ------      Message from: Daneen Schick      Created: Tue Apr 09, 2013  7:34 AM       Severe weakness of the heart. EF 20% (nl should be > 50%). Stable compared with last study ------

## 2013-04-10 NOTE — Telephone Encounter (Signed)
°  Patient is returning your call, please call back.  °

## 2013-04-10 NOTE — ED Notes (Signed)
Pt HR noted to be in the 70's at this time. A-fib still noted on the monitor. Skin warm and dry.

## 2013-04-10 NOTE — Telephone Encounter (Signed)
called to give pt echo results.lmom for pt to return call

## 2013-04-10 NOTE — ED Notes (Addendum)
Pt to department via EMS from home- reports that around 1100am started having chest pain and took nitro. Pt given 6mg  12mg  and then 12mg  of adenosine. No change in HR. Bp-98 Hr-186. Pt reports a hx of a-fib. 20g left hand.

## 2013-04-10 NOTE — ED Provider Notes (Signed)
CSN: [id number]     Arrival date & time 04/10/13  1527 History   First MD Initiated Contact with Patient 04/10/13 1533     Chief Complaint  Patient presents with  . Irregular Heart Beat    HPI  Patient presents with chest pain heartbeat. Symptoms started this morning at 11. At 11:30 she took nitroglycerin became pain free. She describes "tightness" in her anterior chest radiating to her left arm and axilla. She's had similar episodes in the past. Has a complex medical cardiac history including cardiomyopathy previous AICD. This was removed because of infection. She has no current tachycardia device. She had a prolonged QTC that precluded any antiarrhythmic therapy. She had a recent echo showing mural thrombus. She continues on eloquis. She is not currently on Coumadin. Her pain with resolved with the first nitroglycerin. An hour she was having pain again and more where the tachycardia palpitations. Take a second nitroglycerin at home. Her symptoms did not resolve. She called paramedics and was transferred here.  Past Medical History  Diagnosis Date  . Spinal stenosis   . Numbness of foot     left foot  . Diverticulitis     a. s/p partial colectomy 1/12 with reversal in July 2012.  . Colitis, ischemic   . DJD (degenerative joint disease)     History of multiple surgeries to the back, shoulder and knee  . Ischemic cardiomyopathy     a. Echo 06/16/10: EF 25-30%, anteroseptal and apical hypokinesis, moderate AI, mild MR.  . Chronic systolic heart failure   . CAD (coronary artery disease)     a.  Ant MI 8/03 with stenting of the LAD;  b. staged PCI with Cypher DES to OM1 8/03;  c. s/p Cypher DES to LAD 7/04;  d.  multiple cardiac caths in past (chronically abnl ECG);    e. cardiac catheterization 3/12: LAD stent patent, distal LAD 40-50%, small ostial D1 80%, ostial D2 60%, OM-1 stent patent (20-30%), proximal RCA 50%, proximal to mid RCA 40-50%; f. cath 02/17/2011 - nonobs  . LV (left  ventricular) mural thrombus     Chronic Coumadin therapy  . CKD (chronic kidney disease), stage III     creatinine:  1.3 in 4/12;    . Tobacco abuse     history  . GERD (gastroesophageal reflux disease)   . Hypertension   . Lower GI bleed     August 2011  . Hypothyroidism   . Aortic insufficiency   . Pseudoaneurysm     History of, right forearm  . Hyperlipidemia   . Compression fracture 07/04/11    L2  . 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 AICD (automatic cardioverter/defibrillator) present     Implanted for primary prevention. Device revision 2006. Repeat operation at [facility] wtih chronic smoldering infection 11/2012 s/p device extraction and subsequent LifeVest.  . PAF (paroxysmal atrial fibrillation)   . Anatomical narrow angle of right eye   . Blood transfusion   . Anemia   . H/O hiatal hernia   . Stroke     History of TIA and possible CVA; hospitalized 2004; on coumadin  . Sinus bradycardia 03/05/2012  . Paroxysmal atrial flutter    Past Surgical History  Procedure Laterality Date  . Back surgery    . Coronary angioplasty with stent placement  10/2001; 09/2002  . Cardiac defibrillator placement  07/2003; 10/2004  .  Lumbar laminectomy/decompression microdiscectomy  10/2001    L5-S1/E-chart  . Knee arthroscopy      left  . Thyroidectomy  1970's  . Total knee arthroplasty  11/2003    left  . X-stop implantation  12/2004    L3-4; L4-5  . Fixation kyphoplasty thoracic spine  07/2007    T3, 4, 6 compression fractures  . Shoulder open rotator cuff repair  04/2008    right  . Lumbar laminectomy/decompression microdiscectomy  01/2010  . Colectomy  03/2010    sigmoid left  . Colostomy  03/2010    transverse  . Peripherally inserted central catheter insertion  03/2010    removed upon discharge  . Colostomy closure  12/2010    reversal  . Tonsillectomy and adenoidectomy   1969  . Appendectomy  03/2010  . Cataract extraction      right  . Biv icd genertaor change out  12/17/2012  . Icd lead removal Left 12/17/2012    Procedure: ICD LEAD REMOVAL;  Surgeon: Evans Lance, MD;  Location: [facility] OR;  Service: Cardiovascular;  Laterality: Left;   Family History  Problem Relation Age of Onset  . Diabetes Mother   . Diabetes Brother   . Arthritis      family history  . Prostate cancer      Family History   History  Substance Use Topics  . Smoking status: Former Smoker -- 0.10 packs/day for 33 years    Types: Cigarettes    Quit date: 04/21/2001  . Smokeless tobacco: Never Used     Comment: "smoked ~ 1 pack/month"  . Alcohol Use: No     Comment: 07/05/11 "occasionally; last time was glass of wine last week"   OB History   Grav Para Term Preterm Abortions TAB SAB Ect Mult Living                 Review of Systems  Constitutional: Negative for fever, chills, diaphoresis, appetite change and fatigue.  HENT: Negative for mouth sores, sore throat and trouble swallowing.   Eyes: Negative for visual disturbance.  Respiratory: Positive for chest tightness and shortness of breath. Negative for cough and wheezing.   Cardiovascular: Positive for chest pain.  Gastrointestinal: Negative for nausea, vomiting, abdominal pain, diarrhea and abdominal distention.  Endocrine: Negative for polydipsia, polyphagia and polyuria.  Genitourinary: Negative for dysuria, frequency and hematuria.  Musculoskeletal: Negative for gait problem.  Skin: Negative for color change, pallor and rash.  Neurological: Negative for dizziness, syncope, light-headedness and headaches.  Hematological: Does not bruise/bleed easily.  Psychiatric/Behavioral: Negative for behavioral problems and confusion.    Allergies  Coconut oil; Lansoprazole; Penicillins; Statins; Lisinopril; and Lactose intolerance (gi)  Home Medications   Current Outpatient Rx  Name  Route  Sig  Dispense  Refill  .  apixaban (ELIQUIS) 5 MG TABS tablet   Oral   Take 5 mg by mouth 2 (two) times daily.         . carvedilol (COREG) 6.25 MG tablet   Oral   Take 6.25 mg by mouth 2 (two) times daily with a meal.         . ezetimibe (ZETIA) 10 MG tablet   Oral   Take 10 mg by mouth at bedtime.          . fexofenadine (ALLEGRA) 180 MG tablet   Oral   Take 180 mg by mouth daily as needed for allergies.          . fluticasone (  FLONASE) 50 MCG/ACT nasal spray   Each Nare   Place 2 sprays into both nostrils daily as needed for allergies.          . folic acid-pyridoxine-cyancobalamin (FOLTX) 2.5-25-2 MG TABS   Oral   Take 1 tablet by mouth daily.          . furosemide (LASIX) 20 MG tablet   Oral   Take 20 mg by mouth daily.         [name] HYDROcodone-acetaminophen (NORCO) 7.5-325 MG per tablet   Oral   Take 1 tablet by mouth every 8 (eight) hours as needed for pain.         [name] levothyroxine (SYNTHROID, LEVOTHROID) 200 MCG tablet   Oral   Take 200 mcg by mouth daily before breakfast.          . methocarbamol (ROBAXIN) 500 MG tablet   Oral   Take 500 mg by mouth 4 (four) times daily as needed (for spasms).          . nitroGLYCERIN (NITROSTAT) 0.4 MG SL tablet   Sublingual   Place 0.4 mg under the tongue every 5 (five) minutes as needed for chest pain.          [name] omega-3 acid ethyl esters (LOVAZA) 1 G capsule   Oral   Take 1 g by mouth daily.         . pantoprazole (PROTONIX) 40 MG tablet   Oral   Take 40 mg by mouth 2 (two) times daily as needed (gerd).          . polyethylene glycol (MIRALAX / GLYCOLAX) packet   Oral   Take 17 g by mouth daily as needed (for constipation).          . raloxifene (EVISTA) 60 MG tablet   Oral   Take 60 mg by mouth daily.         [name] spironolactone (ALDACTONE) 25 MG tablet   Oral   Take 25 mg by mouth daily.          BP 109/63  Pulse 80  Temp(Src) 98.3 F (36.8 C) (Oral)  Resp 12  SpO2 100% Physical Exam   Constitutional: She is oriented to person, place, and time. She appears well-developed and well-nourished. No distress.  Thin  HENT:  Head: Normocephalic.  Eyes: Conjunctivae are normal. Pupils are equal, round, and reactive to light. No scleral icterus.  Neck: Normal range of motion. Neck supple. No thyromegaly present.  Cardiovascular: An irregularly irregular rhythm present. Tachycardia present.  Exam reveals no gallop and no friction rub.   No murmur heard. AF with RVR.   Pulmonary/Chest: Breath sounds normal. Tachypnea noted. No respiratory distress. She has no decreased breath sounds. She has no wheezes. She has no rhonchi. She has no rales.  Abdominal: Soft. Bowel sounds are normal. She exhibits no distension. There is no tenderness. There is no rebound.  Musculoskeletal: Normal range of motion.  Neurological: She is alert and oriented to person, place, and time.  Skin: Skin is warm and dry. No rash noted.  No edema  Psychiatric: She has a normal mood and affect. Her behavior is normal.    ED Course  Procedures (including critical care time) Labs Review Labs Reviewed  CBC WITH DIFFERENTIAL - Abnormal; Notable for the following:    WBC 3.2 (*)    MCV 100.5 (*)    MCH 35.0 (*)    RDW 16.4 (*)    Platelets 138 (*)  Neutrophils Relative % 36 (*)    Neutro Abs 1.2 (*)    Monocytes Relative 25 (*)    All other components within normal limits  PRO B NATRIURETIC PEPTIDE - Abnormal; Notable for the following:    Pro B Natriuretic peptide (BNP) 3238.0 (*)    All other components within normal limits  COMPREHENSIVE METABOLIC PANEL - Abnormal; Notable for the following:    Glucose, Bld 120 (*)    Albumin 3.4 (*)    AST 76 (*)    GFR calc non Af Amer 56 (*)    GFR calc Af Amer 65 (*)    All other components within normal limits  TROPONIN I  PROTIME-INR  APTT  MAGNESIUM   Imaging Review Dg Chest Portable 1 View  04/10/2013   CLINICAL DATA:  Irregular heartbeat. History  of hypertension, atrial fibrillation and stent placement.  EXAM: PORTABLE CHEST - 1 VIEW  COMPARISON:  DG CHEST 1V PORT dated 03/11/2013; DG CHEST 2 VIEW dated 12/18/2012  FINDINGS: 1556 hr. The heart size and mediastinal contours are stable. There is mild aortic atherosclerosis. The lungs are clear. There is no pleural effusion or pneumothorax. Patient is status post thoracic spinal augmentation. Multiple telemetry leads overlie the chest.  IMPRESSION: No active cardiopulmonary process.   Electronically Signed   By: Camie Patience M.D.   On: 04/10/2013 16:39    EKG Interpretation   None       MDM   1. Atrial fibrillation with rapid ventricular response   2. Chest pain    Patient given Cardizem 50 mg IV. A good response to this. Heart rate goes to 130s down to 80-90. Still in A. fib. Clinically not in failure. Start on nitroglycerin low-dose and has become pain-free. Repeat EKG with rate control shows no ST segment changes , or obvious acute ischemic changesin the emergency room. Inpatient bed has been requested.    Tanna Furry, MD 04/10/13 [phone]

## 2013-04-10 NOTE — Telephone Encounter (Signed)
Dr.Smith aware pt went to the ED today

## 2013-04-10 NOTE — Telephone Encounter (Signed)
Follow up     Pt is now in afib, clammy, nausea and headache---have 3:15 appt with Dr Rayann Heman today for device ck---she want Dr Tamala Julian to know that she is going to the ER.--told triage they said OK

## 2013-04-10 NOTE — Telephone Encounter (Signed)
New message ° ° ° ° ° °Returning Lisa's call °

## 2013-04-10 NOTE — H&P (Signed)
History and Physical  Patient ID: JELISHA RETALLICK MRN: [id number], DOB: 09-28-1950 Date of Encounter: 04/10/2013, 7:16 PM Primary Physician: [name], MD Primary Cardiologist: Dr. Daneen Schick, EP-[facility] (but more recently saw Caryl Comes)  Chief Complaint: CP, AF Reason for Admission: CP, AF  HPI: Ms. Stitz is a 63 y/o F with history of CAD s/p prior stenting, ICM EF 20-25% (more recent component of tachy-mediated as well), paroxysmal afib, atrial flutter who presented to [facility] with CP and recurrent AF. She has history of ICD implanted for primary prevention with subsequent device revision in 2006. She underwent repeat operation by Dr. 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 She underwent device extraction September 2014 And was discharged on a LifeVest. She was seen recently by Dr. Caryl Comes to discuss options for atrial arrhythmias in the setting of worsening CHF. Her QT in hospital was excessive and precluded dofetilide. Amiodarone has in the past has been assoc with hepatotoxicity. Dr. Caryl Comes reviewed with Dr. Rayann Heman and Dr. Lovena Le and she was in the process of being referred for catheter ablation. The other option was felt to be CRT implantation and AV ablation. During recent hospitalization in 02/2013 for similar symptoms to today's presentation, Dr. Marlou Porch did not feel strongly about pursuing stress testing at the time. Last cath 2012 with continued patency of prior stents and scattered luminal irregularities. See his discharge summary from 02/2013 for extensive outline of her recent history of AF/flutter.  She was actually scheduled today to talk to Dr. Rayann Heman about the ablation. This morning she woke up in her USOH. Around 11am, she developed chest tightness, clamminess, nauseated, and elevated HR into the 130's. She took a SL NTG and her pain resolved. She says it also helped her HR come down to the low 100s. An hour and a half later, the same thing  happened. She called our office and was referred to the ER. EMS was called. HR reportedly 180s - was given 3 doses of adenosine without change in HR. Admission ECG shows likely rapid atrial flutter and another ECG with atrial fib vs flutter. She was placed on diltiazem IV and NTG gtt in the ER with improvement in HR. It also appears she had fluid bolus as well. Currently feels well without symptoms. She does not exercise - rarely walks. No recent exertional CP but she feels her "stamina" is down with DOE. No rest CP. CBC is abnormal for WBC 3.2, pBNP 3200, troponin neg. CXR nonacute. Has not taken meds yet for the day but does endorse med compliance. Not fully compliant with Lifevest due to rash on skin where metal plates hit it. Denies fever, chills, nausea, orthopnea, LEE, abdominal distention, syncope.  Past Medical History  Diagnosis Date  . Spinal stenosis   . Numbness of foot     left foot  . Diverticulitis     a. s/p partial colectomy 1/12 with reversal in July 2012.  . Colitis, ischemic   . DJD (degenerative joint disease)     History of multiple surgeries to the back, shoulder and knee  . Ischemic cardiomyopathy     a. Echo 06/16/10: EF 25-30%, anteroseptal and apical hypokinesis, moderate AI, mild MR.  . Chronic systolic heart failure   . CAD (coronary artery disease)     a.  Ant MI 8/03 with stenting of the LAD;  b. staged PCI with Cypher DES to [place] 8/03;  c. s/p Cypher DES to LAD 7/04;  d.  multiple cardiac caths in past (chronically abnl ECG);    e. cardiac catheterization 3/12: LAD stent patent, distal LAD 40-50%, small ostial D1 80%, ostial D2 60%, OM-1 stent patent (20-30%), proximal RCA 50%, proximal to mid RCA 40-50%; f. cath 02/17/2011 - nonobs  . LV (left ventricular) mural thrombus     Chronic Coumadin therapy  . CKD (chronic kidney disease), stage III     creatinine:  1.3 in 4/12;    . Tobacco abuse     history  . GERD (gastroesophageal reflux disease)   . Hypertension     . Lower GI bleed     August 2011  . Hypothyroidism   . Aortic insufficiency   . Pseudoaneurysm     History of, right forearm  . Hyperlipidemia   . Compression fracture 07/04/11    L2  . 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 AICD (automatic cardioverter/defibrillator) present     Implanted for primary prevention. Device revision 2006. Repeat operation at [facility] wtih chronic smoldering infection 11/2012 s/p device extraction and subsequent LifeVest.  . PAF (paroxysmal atrial fibrillation)   . Anatomical narrow angle of right eye   . Blood transfusion   . Anemia   . H/O hiatal hernia   . Stroke     History of TIA and possible CVA; hospitalized 2004; on coumadin  . Sinus bradycardia 03/05/2012  . Paroxysmal atrial flutter      Most Recent Cardiac Studies: Cardiac Cath 05/2010  CARDIAC CATHETERIZATION  INDICATIONS: This very nice lady has had prior stenting of both the LAD  and circumflex. She has an implantable defibrillator. She had onset of  chest pain 10/10, and she was seen by Dr. Doreatha Lew. There is inferior Qs  with some ST elevation, which is clearly different from her previous  EKG, and she was brought over emergently. Her INR on Monday was 1.4,  and her Coumadin dose had been increased in the interim. We spoke with  her on arrival in the lab and her symptoms were improved. There was not  definite ST elevation on the monitor on arrival. We elected to study  her radial artery.  PROCEDURES:  1. Aortic root angiography.  2. Selective coronary arteriography.  3. Placement of catheters without left heart catheterization.  DESCRIPTION OF PROCEDURE: The procedure was performed from the right  radial artery. We placed a 5-French sheath. Intra-arterial verapamil 3  mg intravenous heparin at 3000 units was given. Views of the right and  left coronaries were obtained. I  reviewed the films carefully and  compared them to the old films. I spoke with Dr. Doreatha Lew. There were  no obvious areas of new total occlusion. As a result, the procedure was  completed. A TR band was placed in the right hand. I spoke with her  family. She was taken to the holding area in satisfactory condition.  HEMODYNAMIC DATA: The central aortic pressure was 106/61, mean 18.  ANGIOGRAPHIC DATA:  1. The aortic root appears to be relatively normal in size. There is  at least mild aortic regurgitation.  2. The right coronary artery is segmentally diseased throughout the  entire proximal segment with about 50% narrowing and 40-50%  narrowing throughout the proximal mid vessel, the distal vessel  opens up. There is excellent TIMI III flow to the PDA and  posterolateral system both of which have mild luminal  irregularities.  3. The left main is free of critical disease.  4. The LAD courses to the apex. There is a previously placed stent of  the vessel which is less than 30% obstructed. There is a smaller  first diagonal that has an ostial 80, the second diagonal has about  60% ostial narrowing with 40% in the native, and there is probably  about 50% narrowing in the distal left anterior descending artery  as well.  5. The circumflex is a fairly large-caliber vessel. The large first  marginal is previously stented. There is about 30% narrowing  proximally, then about 20+ and 20% narrowing in the stented  segment, distally the vessels widely patent. The AV circumflex  provides a large posterolateral branch which is free of critical  disease.  CONCLUSIONS:  1. Mild aortic regurgitation.  2. Continued patency of both the LAD and circumflex stents.  3. Scattered luminal irregularities.  DISPOSITION: The etiology of her symptoms are unclear. We did not do  LV angiography because of previous history of LV mural thrombus. We  will get a 2-D echocardiogram. Serial enzymes will be obtained.  Dr.  Doreatha Lew will see the patient.   2D Echo 04/08/13 - Left ventricle: The cavity size was mildly dilated. There was focal basal hypertrophy. Systolic function was severely reduced. The estimated ejection fraction was in the range of 20% to 25%. Akinesis of the anteroseptal and apical myocardium. - Aortic valve: Mild regurgitation. - Mitral valve: Moderate regurgitation. - Left atrium: The atrium was mildly to moderately dilated.   Surgical History:  Past Surgical History  Procedure Laterality Date  . Back surgery    . Coronary angioplasty with stent placement  10/2001; 09/2002  . Cardiac defibrillator placement  07/2003; 10/2004  . Lumbar laminectomy/decompression microdiscectomy  10/2001    L5-S1/E-chart  . Knee arthroscopy      left  . Thyroidectomy  1970's  . Total knee arthroplasty  11/2003    left  . X-stop implantation  12/2004    L3-4; L4-5  . Fixation kyphoplasty thoracic spine  07/2007    T3, 4, 6 compression fractures  . Shoulder open rotator cuff repair  04/2008    right  . Lumbar laminectomy/decompression microdiscectomy  01/2010  . Colectomy  03/2010    sigmoid left  . Colostomy  03/2010    transverse  . Peripherally inserted central catheter insertion  03/2010    removed upon discharge  . Colostomy closure  12/2010    reversal  . Tonsillectomy and adenoidectomy  1969  . Appendectomy  03/2010  . Cataract extraction      right  . Biv icd genertaor change out  12/17/2012  . Icd lead removal Left 12/17/2012    Procedure: ICD LEAD REMOVAL;  Surgeon: Evans Lance, MD;  Location: [facility];  Service: Cardiovascular;  Laterality: Left;     Home Meds: Prior to Admission medications   Medication Sig Start Date End Date Taking? Authorizing Provider  apixaban (ELIQUIS) 5 MG TABS tablet Take 5 mg by mouth 2 (two) times daily.   Yes Historical Provider, MD  carvedilol (COREG) 6.25 MG tablet Take 6.25 mg by mouth 2 (two) times daily with a meal. 06/08/12  Yes Belva Crome III,  MD  ezetimibe (ZETIA) 10 MG tablet Take 10 mg by mouth at bedtime.    Yes Minus Breeding, MD  fexofenadine (ALLEGRA) 180 MG tablet Take 180 mg by mouth daily as needed for allergies.    Yes Historical Provider,  MD  fluticasone (FLONASE) 50 MCG/ACT nasal spray Place 2 sprays into both nostrils daily as needed for allergies.    Yes Historical Provider, MD  folic acid-pyridoxine-cyancobalamin (FOLTX) 2.5-25-2 MG TABS Take 1 tablet by mouth daily.    Yes Historical Provider, MD  furosemide (LASIX) 20 MG tablet Take 20 mg by mouth daily.   Yes Historical Provider, MD  HYDROcodone-acetaminophen (NORCO) 7.5-325 MG per tablet Take 1 tablet by mouth every 8 (eight) hours as needed for pain.   Yes Historical Provider, MD  levothyroxine (SYNTHROID, LEVOTHROID) 200 MCG tablet Take 200 mcg by mouth daily before breakfast.    Yes Historical Provider, MD  methocarbamol (ROBAXIN) 500 MG tablet Take 500 mg by mouth 4 (four) times daily as needed (for spasms).    Yes Historical Provider, MD  nitroGLYCERIN (NITROSTAT) 0.4 MG SL tablet Place 0.4 mg under the tongue every 5 (five) minutes as needed for chest pain.    Yes Historical Provider, MD  omega-3 acid ethyl esters (LOVAZA) 1 G capsule Take 1 g by mouth daily.   Yes Historical Provider, MD  pantoprazole (PROTONIX) 40 MG tablet Take 40 mg by mouth 2 (two) times daily as needed (gerd).    Yes Historical Provider, MD  polyethylene glycol (MIRALAX / GLYCOLAX) packet Take 17 g by mouth daily as needed (for constipation).    Yes Historical Provider, MD  raloxifene (EVISTA) 60 MG tablet Take 60 mg by mouth daily.   Yes Historical Provider, MD  spironolactone (ALDACTONE) 25 MG tablet Take 25 mg by mouth daily.   Yes Historical Provider, MD    Allergies:  Allergies  Allergen Reactions  . Coconut Oil Anaphylaxis and Hives  . Lansoprazole Nausea Only  . Penicillins Swelling    Throat swells  . Statins Other (See Comments)    cramps  . Lisinopril     Cough- but  still tolerates it  . Lactose Intolerance (Gi) Diarrhea and Other (See Comments)    Patient reports abdominal cramping and diarrhea with lactose products.     History   Social History  . Marital Status: Widowed    Spouse Name: N/A    Number of Children: N/A  . Years of Education: N/A   Occupational History  . Not on file.   Social History Main Topics  . Smoking status: Former Smoker -- 0.10 packs/day for 33 years    Types: Cigarettes    Quit date: 04/21/2001  . Smokeless tobacco: Never Used     Comment: "smoked ~ 1 pack/month"  . Alcohol Use: No     Comment: 07/05/11 "occasionally; last time was glass of wine last week"  . Drug Use: No  . Sexual Activity: Yes   Other Topics Concern  . Not on file   Social History Narrative  . No narrative on file     Family History  Problem Relation Age of Onset  . Diabetes Mother   . Diabetes Brother   . Arthritis      family history  . Prostate cancer      Family History    Review of Systems: General: negative for chills, fever, night sweats or weight changes.  Cardiovascular: negative for chest pain, edema, orthopnea, palpitations, paroxysmal nocturnal dyspnea, shortness of breath or dyspnea on exertion Dermatological: negative for rash Respiratory: negative for cough or wheezing Urologic: negative for hematuria Abdominal: negative for nausea, vomiting, diarrhea, bright red blood per rectum, melena, or hematemesis Neurologic: negative for visual changes, syncope, or dizziness All  other systems reviewed and are otherwise negative except as noted above.  Labs:   Lab Results  Component Value Date   WBC 3.2* 04/10/2013   HGB 15.0 04/10/2013   HCT 43.0 04/10/2013   MCV 100.5* 04/10/2013   PLT 138* 04/10/2013    Recent Labs Lab 04/10/13 1557  NA 141  K 4.5  CL 103  CO2 23  BUN 14  CREATININE 1.04  CALCIUM 9.1  PROT 7.2  BILITOT 0.7  ALKPHOS 74  ALT 35  AST 76*  GLUCOSE 120*    Recent Labs  04/10/13 1557    TROPONINI <0.30   Lab Results  Component Value Date   CHOL 219* 06/07/2012   HDL 88 06/07/2012   LDLCALC 119* 06/07/2012   TRIG 62 06/07/2012    Radiology/Studies:  Dg Chest Portable 1 View 04/10/2013   CLINICAL DATA:  Irregular heartbeat. History of hypertension, atrial fibrillation and stent placement.  EXAM: PORTABLE CHEST - 1 VIEW  COMPARISON:  DG CHEST 1V PORT dated 03/11/2013; DG CHEST 2 VIEW dated 12/18/2012  FINDINGS: 1556 hr. The heart size and mediastinal contours are stable. There is mild aortic atherosclerosis. The lungs are clear. There is no pleural effusion or pneumothorax. Patient is status post thoracic spinal augmentation. Multiple telemetry leads overlie the chest.  IMPRESSION: No active cardiopulmonary process.   Electronically Signed   By: Camie Patience M.D.   On: 04/10/2013 16:39   EKG:  Likely atrial flutter 156, old inferior/anterior infart, TWI avL, abnormal ST appearance in V5 F/u tracing atrial fib vs flutter 79bpm old inf infarct, old anterior infarct, TWI avL, abnormal ST appearance in V5 No significant change  Physical Exam: Blood pressure 109/63, pulse 80, temperature 98.3 F (36.8 C), temperature source Oral, resp. rate 12, SpO2 100.00%. General: Well developed, well nourished smiling AAF in no acute distress. Head: Normocephalic, atraumatic, sclera non-icteric, no xanthomas, nares are without discharge.  Neck: Negative for carotid bruits. JVD not elevated. Lungs: Clear bilaterally to auscultation without wheezes, rales, or rhonchi. Breathing is unlabored. Heart: Irreg irreg with S1 S2. No murmurs, rubs, or gallops appreciated. Abdomen: Soft, non-tender, non-distended with normoactive bowel sounds. No hepatomegaly. No rebound/guarding. No obvious abdominal masses. Msk:  Strength and tone appear normal for age. Extremities: No clubbing or cyanosis. No edema.  Distal pedal pulses are 2+ and equal bilaterally. Neuro: Alert and oriented X 3. No focal deficit. No  facial asymmetry. Moves all extremities spontaneously. Psych:  Responds to questions appropriately with a normal affect.    ASSESSMENT AND PLAN:  1. Paroxysmal atrial fib/flutter 2. Chest pain with history of CAD s/p prior MI, PCI 3. Chronic systolic CHF EF 0000000 by echo 04/08/13, s/p ICD with explant 11/2012 due to infection, supposed to be wearing Lifevest but not fully compliant due to skin irritation 4. CKD stage III, Cr stable 5. Valvular heart disease - mild AI, mod MR 6. Leukopenia  Will admit, cycle enzymes, continue NTG gtt. She is now off dilt drip. Continue BB for rate control. Continue home medicines. Will need EP consultation tomorrow to determine next course of action given recurrent bothersome atrial arrhythmias and recent discussion of ablation. Continue apixaban unless she rules in (will need ASA if she does). She missed her dose this morning due to nausea. Consider ischemic eval given associated CP and progression of DOE. Repeat TSH and free T4. F/u PCP for leukopenia. No current evidence of viral infection.  Signed, Melina Copa PA-C 04/10/2013, 7:16 PM Seen in the  emergency room with Melina Copa PA-C.  Agree with her excellent assessment as noted above.  The patient gives the history that her episodes of chest tightness and her need for nitroglycerin sublingually half been increasing in frequency over the past several weeks.  She has also felt weaker and has had more shortness of breath with exertion.  Her EKGs from today were reviewed by me.  She appears to have low amplitude slow flutter waves now with controlled ventricular response.  Her diltiazem has been stopped and her heart rate is remaining in a satisfactory range.  We will continue the nitroglycerin drip overnight as we cycle enzymes.  Plan for EP to see tomorrow.  Consider updating ischemic evaluation as well.

## 2013-04-11 DIAGNOSIS — I4891 Unspecified atrial fibrillation: Secondary | ICD-10-CM

## 2013-04-11 LAB — TROPONIN I

## 2013-04-11 LAB — CBC
HCT: 36.8 % (ref 36.0–46.0)
Hemoglobin: 12.8 g/dL (ref 12.0–15.0)
MCH: 34.9 pg — ABNORMAL HIGH (ref 26.0–34.0)
MCHC: 34.8 g/dL (ref 30.0–36.0)
MCV: 100.3 fL — AB (ref 78.0–100.0)
Platelets: 129 10*3/uL — ABNORMAL LOW (ref 150–400)
RBC: 3.67 MIL/uL — ABNORMAL LOW (ref 3.87–5.11)
RDW: 16.6 % — ABNORMAL HIGH (ref 11.5–15.5)
WBC: 3.4 10*3/uL — ABNORMAL LOW (ref 4.0–10.5)

## 2013-04-11 LAB — T4, FREE: FREE T4: 1.64 ng/dL (ref 0.80–1.80)

## 2013-04-11 LAB — BASIC METABOLIC PANEL
BUN: 14 mg/dL (ref 6–23)
CALCIUM: 8.4 mg/dL (ref 8.4–10.5)
CO2: 22 mEq/L (ref 19–32)
CREATININE: 1.09 mg/dL (ref 0.50–1.10)
Chloride: 103 mEq/L (ref 96–112)
GFR calc non Af Amer: 53 mL/min — ABNORMAL LOW (ref >90)
GFR, EST AFRICAN AMERICAN: 61 mL/min — AB (ref >90)
Glucose, Bld: 93 mg/dL (ref 70–99)
POTASSIUM: 3.9 meq/L (ref 3.7–5.3)
Sodium: 140 mEq/L (ref 137–147)

## 2013-04-11 LAB — TSH: TSH: 2.19 u[IU]/mL (ref 0.350–4.500)

## 2013-04-11 MED ORDER — DILTIAZEM HCL 60 MG PO TABS: 60.0000 mg | ORAL_TABLET | Freq: Every day | ORAL | Status: DC | PRN

## 2013-04-11 NOTE — Discharge Summary (Signed)
ELECTROPHYSIOLOGY DISCHARGE SUMMARY    Patient ID: [name],  MRN: [id number], DOB/AGE: [birth year] 63 y.o.  Admit date: 04/10/2013 Discharge date: 04/11/2013  Primary Care Physician: [name], MD Primary Cardiologist: Daneen Schick, MD Primary EP: Caryl Comes, MD / Lovena Le, MD  Primary Discharge Diagnosis:  1. Atrial fib/flutter with RVR, now resolved, rescheduled appt with Dr. Rayann Heman to discuss AFib ablation  2. Chest pain, most likely secondary to #1, serial troponin negative; will have outpatient stress test  Secondary Discharge Diagnoses:  1. Ischemic CM, EF 25% 2. Chronic systolic HF  3. Recent ICD system infection s/p extraction Sept 2014 with LifeVest in place 4. CAD s/p prior PCI 5. LV thrombus 6. Valvular heart disease - mild AI, mod MR 7. CKD stage III 8. HTN 9. Hypothyroidism 10. Prior TIA/CVA 11. Spinal stenosis  Procedures This Admission:  None   History and Hospital Course:  [name] is a 63 year old woman with CAD s/p prior stenting, ICM EF 20-25%, paroxysmal afibrillation/atrial flutter who presented to [facility] with CP and recurrent AF w/RVR. Please see admission H&P for full details of history. She was admitted and started on IV diltiazem for rate control in addition to IV NTG infusion. She ruled out for MI as her serial troponin were negative. She spontaneously converted to SR within 24 hours of admission. Her symptoms resolved. Diltiazem and NTG were discontinued. She was evaluated by Dr. Lovena Le. Per note, "I suspect her anginal symptoms due to rapid atrial fib/flutter. Her options for treatment are limited. She had amio toxicity and her QT interval long and CHF makes Sotalol and Dronenderone a poor choice. She was scheduled to see Dr. Rayann Heman yesterday but was in the ER. Will plan to discharge home on AV nodal blocking drugs with recommendations to take additional meds if/when she goes back out of rhythm. We discussed AV node ablation as well as  atrial fib ablation and stress testing as an outpatient with her presentation of chest pain with know CAD and prior MI." Short-acting diltiazem 60 mg PRN was added to her medication regimen for recurrent rapid AFib. She was scheduled for an outpatient Lexiscan Myoview for cardiac risk stratification. She has been seen, examined and deemed stable for discharge home today by Dr. Cristopher Peru.    Discharge Vitals: Blood pressure 101/58, pulse 63, temperature 97.6 F (36.4 C), temperature source Oral, resp. rate 20, height 5\' 6"  (1.676 m), weight 142 lb 11.2 oz (64.728 kg), SpO2 97.00%.   Labs: Lab Results  Component Value Date   WBC 3.4* 04/11/2013   HGB 12.8 04/11/2013   HCT 36.8 04/11/2013   MCV 100.3* 04/11/2013   PLT 129* 04/11/2013     Recent Labs Lab 04/10/13 1557 04/11/13 0545  NA 141 140  K 4.5 3.9  CL 103 103  CO2 23 22  BUN 14 14  CREATININE 1.04 1.09  CALCIUM 9.1 8.4  PROT 7.2  --   BILITOT 0.7  --   ALKPHOS 74  --   ALT 35  --   AST 76*  --   GLUCOSE 120* 93   Lab Results  Component Value Date   CKTOTAL 79 09/15/2011   CKMB 2.2 09/15/2011   TROPONINI <0.30 04/11/2013     Recent Labs  04/10/13 1557  INR 1.14    Disposition:  The patient is being discharged in stable condition.  Follow-up:     Follow-up Information   Follow up with Thompson Grayer, MD On 04/22/2013. (  At 12:00 noon for consultation regarding AFib ablation)    Specialty:  Cardiology   Contact information:   [address] [place] [address] [phone]       Follow up with [facility] Office On 04/25/2013. (At 9:15 AM for stress test)    Specialty:  Cardiology   Contact information:   [address] [place] [address] [phone]     Discharge Medications:    Medication List         carvedilol 6.25 MG tablet  Commonly known as:  COREG  Take 6.25 mg by mouth 2 (two) times daily with a meal.     diltiazem 60 MG tablet  Commonly known as:   CARDIZEM  Take 1 tablet (60 mg total) by mouth daily as needed. Take 1 tablet (60 mg) daily as needed for sustained racing palpitations.     ELIQUIS 5 MG Tabs tablet  Generic drug:  apixaban  Take 5 mg by mouth 2 (two) times daily.     ezetimibe 10 MG tablet  Commonly known as:  ZETIA  Take 10 mg by mouth at bedtime.     fexofenadine 180 MG tablet  Commonly known as:  ALLEGRA  Take 180 mg by mouth daily as needed for allergies.     fluticasone 50 MCG/ACT nasal spray  Commonly known as:  FLONASE  Place 2 sprays into both nostrils daily as needed for allergies.     folic acid-pyridoxine-cyancobalamin 2.5-25-2 MG Tabs  Commonly known as:  FOLTX  Take 1 tablet by mouth daily.     furosemide 20 MG tablet  Commonly known as:  LASIX  Take 20 mg by mouth daily.     HYDROcodone-acetaminophen 7.5-325 MG per tablet  Commonly known as:  NORCO  Take 1 tablet by mouth every 8 (eight) hours as needed for pain.     levothyroxine 200 MCG tablet  Commonly known as:  SYNTHROID, LEVOTHROID  Take 200 mcg by mouth daily before breakfast.     methocarbamol 500 MG tablet  Commonly known as:  ROBAXIN  Take 500 mg by mouth 4 (four) times daily as needed (for spasms).     nitroGLYCERIN 0.4 MG SL tablet  Commonly known as:  NITROSTAT  Place 0.4 mg under the tongue every 5 (five) minutes as needed for chest pain.     omega-3 acid ethyl esters 1 G capsule  Commonly known as:  LOVAZA  Take 1 g by mouth daily.     pantoprazole 40 MG tablet  Commonly known as:  PROTONIX  Take 40 mg by mouth 2 (two) times daily as needed (gerd).     polyethylene glycol packet  Commonly known as:  MIRALAX / GLYCOLAX  Take 17 g by mouth daily as needed (for constipation).     raloxifene 60 MG tablet  Commonly known as:  EVISTA  Take 60 mg by mouth daily.     spironolactone 25 MG tablet  Commonly known as:  ALDACTONE  Take 25 mg by mouth daily.       Duration of Discharge Encounter: Greater than 30  minutes including physician time.  Signed, Thayer Embleton, PA-C 04/11/2013, 1:20 PM

## 2013-04-11 NOTE — Progress Notes (Signed)
NURSING PROGRESS NOTE  [name] [id number] Discharge Data: 04/11/2013 8:55 PM Attending Provider: No att. providers found NWG:[id number] Ronnald Ramp, MD     [name] to be D/C'd Home per MD order.  Discussed with the patient the After Visit Summary and all questions fully answered. All IV's discontinued with no bleeding noted. All belongings returned to patient for patient to take home. Patient was given prescriptions and made aware of the medicines she had taken prior to d/c home  Last Vital Signs:  Blood pressure 101/58, pulse 63, temperature 97.6 F (36.4 C), temperature source Oral, resp. rate 20, height 5\' 6"  (1.676 m), weight 64.728 kg (142 lb 11.2 oz), SpO2 97.00%.  Discharge Medication List   Medication List         carvedilol 6.25 MG tablet  Commonly known as:  COREG  Take 6.25 mg by mouth 2 (two) times daily with a meal.     diltiazem 60 MG tablet  Commonly known as:  CARDIZEM  Take 1 tablet (60 mg total) by mouth daily as needed. Take 1 tablet (60 mg) daily as needed for sustained racing palpitations.     ELIQUIS 5 MG Tabs tablet  Generic drug:  apixaban  Take 5 mg by mouth 2 (two) times daily.     ezetimibe 10 MG tablet  Commonly known as:  ZETIA  Take 10 mg by mouth at bedtime.     fexofenadine 180 MG tablet  Commonly known as:  ALLEGRA  Take 180 mg by mouth daily as needed for allergies.     fluticasone 50 MCG/ACT nasal spray  Commonly known as:  FLONASE  Place 2 sprays into both nostrils daily as needed for allergies.     folic acid-pyridoxine-cyancobalamin 2.5-25-2 MG Tabs  Commonly known as:  FOLTX  Take 1 tablet by mouth daily.     furosemide 20 MG tablet  Commonly known as:  LASIX  Take 20 mg by mouth daily.     HYDROcodone-acetaminophen 7.5-325 MG per tablet  Commonly known as:  NORCO  Take 1 tablet by mouth every 8 (eight) hours as needed for pain.     levothyroxine 200 MCG tablet  Commonly known as:  SYNTHROID, LEVOTHROID  Take 200 mcg by  mouth daily before breakfast.     methocarbamol 500 MG tablet  Commonly known as:  ROBAXIN  Take 500 mg by mouth 4 (four) times daily as needed (for spasms).     nitroGLYCERIN 0.4 MG SL tablet  Commonly known as:  NITROSTAT  Place 0.4 mg under the tongue every 5 (five) minutes as needed for chest pain.     omega-3 acid ethyl esters 1 G capsule  Commonly known as:  LOVAZA  Take 1 g by mouth daily.     pantoprazole 40 MG tablet  Commonly known as:  PROTONIX  Take 40 mg by mouth 2 (two) times daily as needed (gerd).     polyethylene glycol packet  Commonly known as:  MIRALAX / GLYCOLAX  Take 17 g by mouth daily as needed (for constipation).     raloxifene 60 MG tablet  Commonly known as:  EVISTA  Take 60 mg by mouth daily.     spironolactone 25 MG tablet  Commonly known as:  ALDACTONE  Take 25 mg by mouth daily.

## 2013-04-11 NOTE — Progress Notes (Signed)
Patient ID: [name], female   DOB: Oct 17, 1950, 63 y.o.   MRN: [id number] Subjective:  No chest pain or sob.  Objective:  Vital Signs in the last 24 hours: Temp:  [97.8 F (36.6 C)-98.3 F (36.8 C)] 97.8 F (36.6 C) (01/22 0521) Pulse Rate:  [43-166] 55 (01/22 0521) Resp:  [12-20] 18 (01/22 0521) BP: (92-132)/(45-84) 92/50 mmHg (01/22 0521) SpO2:  [98 %-100 %] 100 % (01/22 0521) Weight:  [142 lb 11.2 oz (64.728 kg)-142 lb 12.8 oz (64.774 kg)] 142 lb 11.2 oz (64.728 kg) (01/22 0521)  Intake/Output from previous day: 01/21 0701 - 01/22 0700 In: -  Out: 800 [Urine:800] Intake/Output from this shift:    Physical Exam: Well appearing NAD HEENT: Unremarkable Neck:  No JVD, no thyromegally Lymphatics:  No adenopathy Back:  No CVA tenderness Lungs:  Clear with no wheezes, rales, or rhonchi HEART:  Regular rate rhythm, no murmurs, no rubs, no clicks Abd:  Flat, positive bowel sounds, no organomegally, no rebound, no guarding Ext:  2 plus pulses, no edema, no cyanosis, no clubbing Skin:  No rashes no nodules Neuro:  CN II through XII intact, motor grossly intact  Lab Results:  Recent Labs  04/10/13 1557 04/11/13 0545  WBC 3.2* 3.4*  HGB 15.0 12.8  PLT 138* 129*    Recent Labs  04/10/13 1557 04/11/13 0545  NA 141 140  K 4.5 3.9  CL 103 103  CO2 23 22  GLUCOSE 120* 93  BUN 14 14  CREATININE 1.04 1.09    Recent Labs  04/10/13 2109 04/11/13 0545  TROPONINI <0.30 <0.30   Hepatic Function Panel  Recent Labs  04/10/13 1557  PROT 7.2  ALBUMIN 3.4*  AST 76*  ALT 35  ALKPHOS 74  BILITOT 0.7   No results found for this basename: CHOL,  in the last 72 hours No results found for this basename: PROTIME,  in the last 72 hours  Imaging: Dg Chest Portable 1 View  04/10/2013   CLINICAL DATA:  Irregular heartbeat. History of hypertension, atrial fibrillation and stent placement.  EXAM: PORTABLE CHEST - 1 VIEW  COMPARISON:  DG CHEST 1V PORT dated 03/11/2013;  DG CHEST 2 VIEW dated 12/18/2012  FINDINGS: 1556 hr. The heart size and mediastinal contours are stable. There is mild aortic atherosclerosis. The lungs are clear. There is no pleural effusion or pneumothorax. Patient is status post thoracic spinal augmentation. Multiple telemetry leads overlie the chest.  IMPRESSION: No active cardiopulmonary process.   Electronically Signed   By: Camie Patience M.D.   On: 04/10/2013 16:39    Cardiac Studies: Tele - nsr Assessment/Plan:  1. Atrial fib/flutter with an RVR 2. Chest pain secondary to #1 3. Chronic systolic CHF 4. Remote ICD system infection s/p extraction 5. ICM, EF 25% Rec: Her current presentation of atrial fibrillation with an RVR now resolved and no chest pain. She has ruled out for MI. I suspect her anginal symptoms due to rapid atrial fib/flutter. Her options for treatment are limited. She had amio toxicity and her QT interval long and CHF makes Sotalol and Dronenderone a poor choice. She was scheduled to see Dr. Rayann Heman yesterday but was in the ER. Will plan to discharge home on AV nodal blocking drugs with recommendations to take additional meds if/when she goes back out of rhythm. We discussed AV node ablation as well as atrial fib ablation and stress testing as an outpatient with her presentation of chest pain with know CAD and prior  MI.   LOS: 1 day    Idamay Hosein,M.D. 04/11/2013, 10:23 AM

## 2013-04-22 ENCOUNTER — Emergency Department (HOSPITAL_COMMUNITY): Payer: Medicare Other

## 2013-04-22 ENCOUNTER — Observation Stay (HOSPITAL_COMMUNITY)
Admission: EM | Admit: 2013-04-22 | Discharge: 2013-04-23 | Disposition: A | Discharge status: Home / Self Care | Payer: Medicare Other | Attending: Internal Medicine | Admitting: Internal Medicine

## 2013-04-22 ENCOUNTER — Encounter: Payer: Medicare Other | Admitting: Internal Medicine

## 2013-04-22 ENCOUNTER — Encounter (HOSPITAL_COMMUNITY): Payer: Self-pay | Admitting: Emergency Medicine

## 2013-04-22 DIAGNOSIS — R0789 Other chest pain: Principal | ICD-10-CM | POA: Insufficient documentation

## 2013-04-22 DIAGNOSIS — Z8781 Personal history of (healed) traumatic fracture: Secondary | ICD-10-CM | POA: Insufficient documentation

## 2013-04-22 DIAGNOSIS — I4891 Unspecified atrial fibrillation: Secondary | ICD-10-CM | POA: Insufficient documentation

## 2013-04-22 DIAGNOSIS — Z9861 Coronary angioplasty status: Secondary | ICD-10-CM | POA: Insufficient documentation

## 2013-04-22 DIAGNOSIS — I129 Hypertensive chronic kidney disease with stage 1 through stage 4 chronic kidney disease, or unspecified chronic kidney disease: Secondary | ICD-10-CM | POA: Insufficient documentation

## 2013-04-22 DIAGNOSIS — N189 Chronic kidney disease, unspecified: Secondary | ICD-10-CM

## 2013-04-22 DIAGNOSIS — Z888 Allergy status to other drugs, medicaments and biological substances status: Secondary | ICD-10-CM | POA: Insufficient documentation

## 2013-04-22 DIAGNOSIS — R300 Dysuria: Secondary | ICD-10-CM

## 2013-04-22 DIAGNOSIS — I119 Hypertensive heart disease without heart failure: Secondary | ICD-10-CM | POA: Diagnosis present

## 2013-04-22 DIAGNOSIS — I2589 Other forms of chronic ischemic heart disease: Secondary | ICD-10-CM

## 2013-04-22 DIAGNOSIS — I252 Old myocardial infarction: Secondary | ICD-10-CM | POA: Insufficient documentation

## 2013-04-22 DIAGNOSIS — N898 Other specified noninflammatory disorders of vagina: Secondary | ICD-10-CM

## 2013-04-22 DIAGNOSIS — G8929 Other chronic pain: Secondary | ICD-10-CM | POA: Insufficient documentation

## 2013-04-22 DIAGNOSIS — R05 Cough: Secondary | ICD-10-CM

## 2013-04-22 DIAGNOSIS — T464X5A Adverse effect of angiotensin-converting-enzyme inhibitors, initial encounter: Secondary | ICD-10-CM

## 2013-04-22 DIAGNOSIS — I5042 Chronic combined systolic (congestive) and diastolic (congestive) heart failure: Secondary | ICD-10-CM | POA: Diagnosis present

## 2013-04-22 DIAGNOSIS — Z9109 Other allergy status, other than to drugs and biological substances: Secondary | ICD-10-CM | POA: Insufficient documentation

## 2013-04-22 DIAGNOSIS — N390 Urinary tract infection, site not specified: Secondary | ICD-10-CM

## 2013-04-22 DIAGNOSIS — Z95810 Presence of automatic (implantable) cardiac defibrillator: Secondary | ICD-10-CM | POA: Insufficient documentation

## 2013-04-22 DIAGNOSIS — M4800 Spinal stenosis, site unspecified: Secondary | ICD-10-CM

## 2013-04-22 DIAGNOSIS — I4892 Unspecified atrial flutter: Secondary | ICD-10-CM

## 2013-04-22 DIAGNOSIS — IMO0002 Reserved for concepts with insufficient information to code with codable children: Secondary | ICD-10-CM | POA: Insufficient documentation

## 2013-04-22 DIAGNOSIS — N183 Chronic kidney disease, stage 3 unspecified: Secondary | ICD-10-CM | POA: Insufficient documentation

## 2013-04-22 DIAGNOSIS — I639 Cerebral infarction, unspecified: Secondary | ICD-10-CM

## 2013-04-22 DIAGNOSIS — R079 Chest pain, unspecified: Secondary | ICD-10-CM | POA: Diagnosis present

## 2013-04-22 DIAGNOSIS — S32029A Unspecified fracture of second lumbar vertebra, initial encounter for closed fracture: Secondary | ICD-10-CM

## 2013-04-22 DIAGNOSIS — Z8673 Personal history of transient ischemic attack (TIA), and cerebral infarction without residual deficits: Secondary | ICD-10-CM | POA: Insufficient documentation

## 2013-04-22 DIAGNOSIS — I6529 Occlusion and stenosis of unspecified carotid artery: Secondary | ICD-10-CM

## 2013-04-22 DIAGNOSIS — I2510 Atherosclerotic heart disease of native coronary artery without angina pectoris: Secondary | ICD-10-CM | POA: Insufficient documentation

## 2013-04-22 DIAGNOSIS — R011 Cardiac murmur, unspecified: Secondary | ICD-10-CM | POA: Insufficient documentation

## 2013-04-22 DIAGNOSIS — E785 Hyperlipidemia, unspecified: Secondary | ICD-10-CM | POA: Insufficient documentation

## 2013-04-22 DIAGNOSIS — I5022 Chronic systolic (congestive) heart failure: Secondary | ICD-10-CM | POA: Insufficient documentation

## 2013-04-22 DIAGNOSIS — I255 Ischemic cardiomyopathy: Secondary | ICD-10-CM

## 2013-04-22 DIAGNOSIS — Z880 Allergy status to penicillin: Secondary | ICD-10-CM | POA: Insufficient documentation

## 2013-04-22 DIAGNOSIS — I359 Nonrheumatic aortic valve disorder, unspecified: Secondary | ICD-10-CM | POA: Insufficient documentation

## 2013-04-22 DIAGNOSIS — I480 Paroxysmal atrial fibrillation: Secondary | ICD-10-CM | POA: Diagnosis present

## 2013-04-22 DIAGNOSIS — Z87891 Personal history of nicotine dependence: Secondary | ICD-10-CM | POA: Insufficient documentation

## 2013-04-22 DIAGNOSIS — Z7901 Long term (current) use of anticoagulants: Secondary | ICD-10-CM

## 2013-04-22 DIAGNOSIS — K219 Gastro-esophageal reflux disease without esophagitis: Secondary | ICD-10-CM | POA: Insufficient documentation

## 2013-04-22 DIAGNOSIS — N179 Acute kidney failure, unspecified: Secondary | ICD-10-CM

## 2013-04-22 DIAGNOSIS — M1990 Unspecified osteoarthritis, unspecified site: Secondary | ICD-10-CM | POA: Insufficient documentation

## 2013-04-22 DIAGNOSIS — D649 Anemia, unspecified: Secondary | ICD-10-CM | POA: Insufficient documentation

## 2013-04-22 DIAGNOSIS — Z7902 Long term (current) use of antithrombotics/antiplatelets: Secondary | ICD-10-CM | POA: Insufficient documentation

## 2013-04-22 DIAGNOSIS — I10 Essential (primary) hypertension: Secondary | ICD-10-CM

## 2013-04-22 DIAGNOSIS — E039 Hypothyroidism, unspecified: Secondary | ICD-10-CM | POA: Diagnosis present

## 2013-04-22 DIAGNOSIS — Z7982 Long term (current) use of aspirin: Secondary | ICD-10-CM | POA: Insufficient documentation

## 2013-04-22 LAB — TROPONIN I
Troponin I: <0.3 ng/mL (ref <0.30)
Troponin I: <0.3 ng/mL (ref <0.30)

## 2013-04-22 LAB — CBC
HCT: 39.4 % (ref 36.0–46.0)
Hemoglobin: 14.2 g/dL (ref 12.0–15.0)
MCH: 35.6 pg — ABNORMAL HIGH (ref 26.0–34.0)
MCHC: 36 g/dL (ref 30.0–36.0)
MCV: 98.7 fL (ref 78.0–100.0)
PLATELETS: 144 10*3/uL — AB (ref 150–400)
RBC: 3.99 MIL/uL (ref 3.87–5.11)
RDW: 16.3 % — ABNORMAL HIGH (ref 11.5–15.5)
WBC: 3.3 10*3/uL — AB (ref 4.0–10.5)

## 2013-04-22 LAB — BASIC METABOLIC PANEL
BUN: 18 mg/dL (ref 6–23)
CO2: 27 mEq/L (ref 19–32)
Calcium: 8.8 mg/dL (ref 8.4–10.5)
Chloride: 92 mEq/L — ABNORMAL LOW (ref 96–112)
Creatinine, Ser: 1.35 mg/dL — ABNORMAL HIGH (ref 0.50–1.10)
GFR calc Af Amer: 47 mL/min — ABNORMAL LOW (ref >90)
GFR calc non Af Amer: 41 mL/min — ABNORMAL LOW (ref >90)
Glucose, Bld: 108 mg/dL — ABNORMAL HIGH (ref 70–99)
Potassium: 4.3 mEq/L (ref 3.7–5.3)
Sodium: 135 mEq/L — ABNORMAL LOW (ref 137–147)

## 2013-04-22 LAB — CBC WITH DIFFERENTIAL/PLATELET
Basophils Absolute: 0 10*3/uL (ref 0.0–0.1)
Basophils Relative: 1 % (ref 0–1)
Eosinophils Absolute: 0 10*3/uL (ref 0.0–0.7)
Eosinophils Relative: 1 % (ref 0–5)
HCT: 40.3 % (ref 36.0–46.0)
Hemoglobin: 14.6 g/dL (ref 12.0–15.0)
Lymphocytes Relative: 37 % (ref 12–46)
Lymphs Abs: 1.5 10*3/uL (ref 0.7–4.0)
MCH: 35.4 pg — ABNORMAL HIGH (ref 26.0–34.0)
MCHC: 36.2 g/dL — ABNORMAL HIGH (ref 30.0–36.0)
MCV: 97.6 fL (ref 78.0–100.0)
Monocytes Absolute: 0.9 10*3/uL (ref 0.1–1.0)
Monocytes Relative: 22 % — ABNORMAL HIGH (ref 3–12)
Neutro Abs: 1.6 10*3/uL — ABNORMAL LOW (ref 1.7–7.7)
Neutrophils Relative %: 40 % — ABNORMAL LOW (ref 43–77)
Platelets: 151 10*3/uL (ref 150–400)
RBC: 4.13 MIL/uL (ref 3.87–5.11)
RDW: 16 % — ABNORMAL HIGH (ref 11.5–15.5)
WBC: 3.9 10*3/uL — ABNORMAL LOW (ref 4.0–10.5)

## 2013-04-22 LAB — CREATININE, SERUM
CREATININE: 1.21 mg/dL — AB (ref 0.50–1.10)
GFR calc non Af Amer: 47 mL/min — ABNORMAL LOW (ref >90)
GFR, EST AFRICAN AMERICAN: 54 mL/min — AB (ref >90)

## 2013-04-22 LAB — PRO B NATRIURETIC PEPTIDE: Pro B Natriuretic peptide (BNP): 754.3 pg/mL — ABNORMAL HIGH (ref 0–125)

## 2013-04-22 LAB — HEPATIC FUNCTION PANEL
ALT: 37 U/L — ABNORMAL HIGH (ref 0–35)
AST: 84 U/L — ABNORMAL HIGH (ref 0–37)
Albumin: 3.5 g/dL (ref 3.5–5.2)
Alkaline Phosphatase: 67 U/L (ref 39–117)
BILIRUBIN DIRECT: 0.3 mg/dL (ref 0.0–0.3)
Indirect Bilirubin: 0.6 mg/dL (ref 0.3–0.9)
Total Bilirubin: 0.9 mg/dL (ref 0.3–1.2)
Total Protein: 7.6 g/dL (ref 6.0–8.3)

## 2013-04-22 LAB — TSH: TSH: 1.635 u[IU]/mL (ref 0.350–4.500)

## 2013-04-22 LAB — PHOSPHORUS: Phosphorus: 3.9 mg/dL (ref 2.3–4.6)

## 2013-04-22 LAB — LIPID PANEL
Cholesterol: 226 mg/dL — ABNORMAL HIGH (ref 0–200)
HDL: 119 mg/dL (ref >39)
LDL CALC: 93 mg/dL (ref 0–99)
Total CHOL/HDL Ratio: 1.9 RATIO
Triglycerides: 68 mg/dL (ref <150)
VLDL: 14 mg/dL (ref 0–40)

## 2013-04-22 LAB — VITAMIN B12: VITAMIN B 12: 884 pg/mL (ref 211–911)

## 2013-04-22 LAB — MAGNESIUM: MAGNESIUM: 1.9 mg/dL (ref 1.5–2.5)

## 2013-04-22 MED ORDER — MORPHINE SULFATE 2 MG/ML IJ SOLN: 2.0000 mg | INTRAMUSCULAR | Status: DC | PRN

## 2013-04-22 MED ORDER — EZETIMIBE 10 MG PO TABS
10.0000 mg | ORAL_TABLET | Freq: Every day | ORAL | Status: DC
  Administered 2013-04-22: 10 mg via ORAL
  Filled 2013-04-22 (×2): qty 1

## 2013-04-22 MED ORDER — NITROGLYCERIN 0.4 MG SL SUBL: 0.4000 mg | SUBLINGUAL_TABLET | SUBLINGUAL | Status: DC | PRN

## 2013-04-22 MED ORDER — CARVEDILOL 6.25 MG PO TABS
6.2500 mg | ORAL_TABLET | Freq: Two times a day (BID) | ORAL | Status: DC
  Filled 2013-04-22 (×3): qty 1

## 2013-04-22 MED ORDER — ONDANSETRON HCL 4 MG/2ML IJ SOLN
4.0000 mg | Freq: Once | INTRAMUSCULAR | Status: AC
  Administered 2013-04-22: 4 mg via INTRAVENOUS
  Filled 2013-04-22: qty 2

## 2013-04-22 MED ORDER — ONDANSETRON HCL 4 MG/2ML IJ SOLN
4.0000 mg | Freq: Four times a day (QID) | INTRAMUSCULAR | Status: DC | PRN
  Administered 2013-04-22: 4 mg via INTRAVENOUS
  Filled 2013-04-22: qty 2

## 2013-04-22 MED ORDER — DILTIAZEM HCL 60 MG PO TABS
60.0000 mg | ORAL_TABLET | Freq: Every day | ORAL | Status: DC | PRN
  Filled 2013-04-22: qty 1

## 2013-04-22 MED ORDER — APIXABAN 5 MG PO TABS
5.0000 mg | ORAL_TABLET | Freq: Two times a day (BID) | ORAL | Status: DC
  Administered 2013-04-22 – 2013-04-23 (×2): 5 mg via ORAL
  Filled 2013-04-22 (×3): qty 1

## 2013-04-22 MED ORDER — RALOXIFENE HCL 60 MG PO TABS
60.0000 mg | ORAL_TABLET | Freq: Every day | ORAL | Status: DC
  Administered 2013-04-22 – 2013-04-23 (×2): 60 mg via ORAL
  Filled 2013-04-22 (×2): qty 1

## 2013-04-22 MED ORDER — GI COCKTAIL ~~LOC~~: 30.0000 mL | Freq: Four times a day (QID) | ORAL | Status: DC | PRN

## 2013-04-22 MED ORDER — SODIUM CHLORIDE 0.9 % IV SOLN
Freq: Once | INTRAVENOUS | Status: AC
  Administered 2013-04-22: 13:00:00 via INTRAVENOUS

## 2013-04-22 MED ORDER — NITROGLYCERIN 2 % TD OINT: 0.5000 [in_us] | TOPICAL_OINTMENT | Freq: Once | TRANSDERMAL | Status: DC

## 2013-04-22 MED ORDER — LEVOTHYROXINE SODIUM 200 MCG PO TABS
200.0000 ug | ORAL_TABLET | Freq: Every day | ORAL | Status: DC
Start: 2013-04-23 — End: 2013-04-23
  Administered 2013-04-23: 200 ug via ORAL
  Filled 2013-04-22 (×2): qty 1

## 2013-04-22 MED ORDER — OMEGA-3-ACID ETHYL ESTERS 1 G PO CAPS
1.0000 g | ORAL_CAPSULE | Freq: Every day | ORAL | Status: DC
  Administered 2013-04-22 – 2013-04-23 (×2): 1 g via ORAL
  Filled 2013-04-22 (×2): qty 1

## 2013-04-22 MED ORDER — GI COCKTAIL ~~LOC~~
30.0000 mL | Freq: Two times a day (BID) | ORAL | Status: DC | PRN
  Administered 2013-04-22: 30 mL via ORAL
  Filled 2013-04-22: qty 30

## 2013-04-22 MED ORDER — ACETAMINOPHEN 325 MG PO TABS: 650.0000 mg | ORAL_TABLET | ORAL | Status: DC | PRN

## 2013-04-22 MED ORDER — PANTOPRAZOLE SODIUM 40 MG PO TBEC: 40.0000 mg | DELAYED_RELEASE_TABLET | Freq: Two times a day (BID) | ORAL | Status: DC | PRN

## 2013-04-22 MED ORDER — FLUTICASONE PROPIONATE 50 MCG/ACT NA SUSP
2.0000 | Freq: Every day | NASAL | Status: DC | PRN
  Filled 2013-04-22: qty 16

## 2013-04-22 MED ORDER — ASPIRIN EC 81 MG PO TBEC
81.0000 mg | DELAYED_RELEASE_TABLET | Freq: Every day | ORAL | Status: DC
  Administered 2013-04-23: 81 mg via ORAL
  Filled 2013-04-22 (×2): qty 1

## 2013-04-22 MED ORDER — POLYETHYLENE GLYCOL 3350 17 G PO PACK
17.0000 g | PACK | Freq: Every day | ORAL | Status: DC | PRN
  Filled 2013-04-22: qty 1

## 2013-04-22 MED ORDER — ENOXAPARIN SODIUM 30 MG/0.3ML ~~LOC~~ SOLN: 30.0000 mg | SUBCUTANEOUS | Status: DC

## 2013-04-22 MED ORDER — SODIUM CHLORIDE 0.9 % IV SOLN: INTRAVENOUS | Status: DC

## 2013-04-22 MED ORDER — MORPHINE SULFATE 4 MG/ML IJ SOLN
4.0000 mg | Freq: Once | INTRAMUSCULAR | Status: AC
  Administered 2013-04-22: 4 mg via INTRAVENOUS
  Filled 2013-04-22: qty 1

## 2013-04-22 MED ORDER — FA-PYRIDOXINE-CYANOCOBALAMIN 2.5-25-2 MG PO TABS
1.0000 | ORAL_TABLET | Freq: Every day | ORAL | Status: DC
  Administered 2013-04-22 – 2013-04-23 (×2): 1 via ORAL
  Filled 2013-04-22 (×2): qty 1

## 2013-04-22 NOTE — ED Notes (Signed)
To ED from home via [place], c/o 2d of central CP, full relief with nitro yesterday, pain returned today, did not take nitro, EMS gave 1 nitro (pain from 8 to 5) and 324 ASA, c/o mild SOB with exertion, 22 left forearm, pain 5/10 on arrival, A/O X4, NAD

## 2013-04-22 NOTE — ED Notes (Signed)
Pt vomited after receiving GI cocktail

## 2013-04-22 NOTE — ED Provider Notes (Signed)
CSN: [id number]     Arrival date & time 04/22/13  1128 History   First MD Initiated Contact with Patient 04/22/13 1153     Chief Complaint  Patient presents with  . Chest Pain  . Weakness   (Consider location/radiation/quality/duration/timing/severity/associated sxs/prior Treatment) HPI  [name] is a 63 y/o F with history of CAD s/p prior stenting, ICM w/ an EF of 20-25% (more recent component of tachy-mediated as well), paroxysmal afib, atrial flutter, AICD implantation and subsequent removal for site infection.Most recent cath appears to have been in 2012 which showed "No significant obstructive coronary disease. Prior stents in the LAD and first obtuse marginal vessel are widely patent."  Today with chest pain. She describes substernal chest pain. She had similar type pain yesterday which has improved with nitroglycerin. She woke up with the same pain again around 6:00 this morning. Associated with diaphoresis. Radiates to mid back. She was given nitroglycerin by EMS and in 324 mg of aspirin with improvement of her pain, but not resolution. On eliquis. No fevers or chills. No cough. Denies any shortness of breath currently but has had dyspnea with exertion. No unusual leg pain or swelling.   Past Medical History  Diagnosis Date  . Spinal stenosis   . Numbness of foot     left foot  . Diverticulitis     a. s/p partial colectomy 1/12 with reversal in July 2012.  . Colitis, ischemic   . DJD (degenerative joint disease)     History of multiple surgeries to the back, shoulder and knee  . Ischemic cardiomyopathy     a. Echo 06/16/10: EF 25-30%, anteroseptal and apical hypokinesis, moderate AI, mild MR.  . Chronic systolic heart failure   . CAD (coronary artery disease)     a.  Ant MI 8/03 with stenting of the LAD;  b. staged PCI with Cypher DES to OM1 8/03;  c. s/p Cypher DES to LAD 7/04;  d.  multiple cardiac caths in past (chronically abnl ECG);    e. cardiac catheterization 3/12: LAD  stent patent, distal LAD 40-50%, small ostial D1 80%, ostial D2 60%, OM-1 stent patent (20-30%), proximal RCA 50%, proximal to mid RCA 40-50%; f. cath 02/17/2011 - nonobs  . LV (left ventricular) mural thrombus   . CKD (chronic kidney disease), stage III     creatinine:  1.3 in 4/12;    . Tobacco abuse     history  . GERD (gastroesophageal reflux disease)   . Hypertension   . Lower GI bleed     August 2011  . Hypothyroidism   . Aortic insufficiency   . Pseudoaneurysm     History of, right forearm  . Hyperlipidemia   . Compression fracture 07/04/11    L2  . 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 AICD (automatic cardioverter/defibrillator) present     Implanted for primary prevention. Device revision 2006. Repeat operation at [facility] wtih chronic smoldering infection 11/2012 s/p device extraction and subsequent LifeVest.  . PAF (paroxysmal atrial fibrillation)   . Anatomical narrow angle of right eye   . Blood transfusion     "must have been when I had the total knee replacement" (04/10/2013)  . Anemia   . H/O hiatal hernia   . Sinus bradycardia 03/05/2012  . Paroxysmal atrial flutter   . Mitral regurgitation   . Heart murmur   . CHF (  congestive heart failure)     "3-4 times" (04/10/2013)  . Myocardial infarction 2002  . Stroke     History of TIA and possible CVA; hospitalized 2004; on coumadin  . Stroke     "I've had 3"; denies residual on 04/10/2013  . Chronic lower back pain    Past Surgical History  Procedure Laterality Date  . Back surgery    . Cardiac defibrillator placement  07/2003; 10/2004  . Lumbar laminectomy/decompression microdiscectomy  10/2001    L5-S1/E-chart  . Knee arthroscopy Right   . Thyroidectomy  1970's  . Total knee arthroplasty Left 11/2003  . X-stop implantation  12/2004    L3-4; L4-5  . Fixation kyphoplasty thoracic spine  07/2007    T3, 4, 6  compression fractures  . Shoulder open rotator cuff repair Right 04/2008  . Lumbar laminectomy/decompression microdiscectomy  01/2010  . Colectomy  03/2010    sigmoid left  . Colostomy  03/2010    transverse  . Peripherally inserted central catheter insertion  03/2010    removed upon discharge  . Colostomy closure  12/2010    reversal  . Tonsillectomy and adenoidectomy  1969  . Appendectomy  03/2010  . Glaucoma surgery Right   . Biv icd genertaor change out  12/17/2012    "right now I don't have a defibrillator; I've been using an external one" (04/10/2013)  . Icd lead removal Left 12/17/2012    Procedure: ICD LEAD REMOVAL;  Surgeon: Evans Lance, MD;  Location: [facility];  Service: Cardiovascular;  Laterality: Left;  . Coronary angioplasty with stent placement  10/2001; 09/2002; ?date    "I've got a total of 4" (04/10/2013)   Family History  Problem Relation Age of Onset  . Diabetes Mother   . Diabetes Brother   . Arthritis      family history  . Prostate cancer      Family History   History  Substance Use Topics  . Smoking status: Former Smoker -- 0.10 packs/day for 33 years    Types: Cigarettes    Quit date: 04/21/2001  . Smokeless tobacco: Never Used     Comment: "smoked ~ 1 pack/month"  . Alcohol Use: No     Comment: 07/05/11 "occasionally; last time was glass of wine last week"   OB History   Grav Para Term Preterm Abortions TAB SAB Ect Mult Living                 Review of Systems   All systems reviewed and negative, other than as noted in HPI.  Allergies  Coconut oil; Lansoprazole; Penicillins; Statins; Lisinopril; and Lactose intolerance (gi)  Home Medications   Current Outpatient Rx  Name  Route  Sig  Dispense  Refill  . apixaban (ELIQUIS) 5 MG TABS tablet   Oral   Take 5 mg by mouth 2 (two) times daily.         [name] aspirin 81 MG chewable tablet   Oral   Chew 324 mg by mouth once.         [name] aspirin EC 81 MG tablet   Oral   Take 81 mg by mouth daily.          . carvedilol (COREG) 6.25 MG tablet   Oral   Take 6.25 mg by mouth 2 (two) times daily with a meal.         . diltiazem (CARDIZEM) 60 MG tablet   Oral   Take 1 tablet (60  mg total) by mouth daily as needed. Take 1 tablet (60 mg) daily as needed for sustained racing palpitations.   30 tablet   4   . ezetimibe (ZETIA) 10 MG tablet   Oral   Take 10 mg by mouth at bedtime.          . fexofenadine (ALLEGRA) 180 MG tablet   Oral   Take 180 mg by mouth daily as needed for allergies.          . fluticasone (FLONASE) 50 MCG/ACT nasal spray   Each Nare   Place 2 sprays into both nostrils daily as needed for allergies.          . folic acid-pyridoxine-cyancobalamin (FOLTX) 2.5-25-2 MG TABS   Oral   Take 1 tablet by mouth daily.          . furosemide (LASIX) 20 MG tablet   Oral   Take 20 mg by mouth daily.         [name] levothyroxine (SYNTHROID, LEVOTHROID) 200 MCG tablet   Oral   Take 200 mcg by mouth daily before breakfast.          . nitroGLYCERIN (NITROSTAT) 0.4 MG SL tablet   Sublingual   Place 0.4 mg under the tongue every 5 (five) minutes as needed for chest pain.          [name] omega-3 acid ethyl esters (LOVAZA) 1 G capsule   Oral   Take 1 g by mouth daily.         . polyethylene glycol (MIRALAX / GLYCOLAX) packet   Oral   Take 17 g by mouth daily as needed (for constipation).          . raloxifene (EVISTA) 60 MG tablet   Oral   Take 60 mg by mouth daily.         [name] spironolactone (ALDACTONE) 25 MG tablet   Oral   Take 25 mg by mouth daily.         . pantoprazole (PROTONIX) 40 MG tablet   Oral   Take 40 mg by mouth 2 (two) times daily as needed (gerd).           BP 109/68  Pulse 66  Temp(Src) 98.2 F (36.8 C) (Oral)  Resp 18  Ht 5\' 6"  (1.676 m)  Wt 142 lb (64.411 kg)  BMI 22.93 kg/m2  SpO2 100% Physical Exam  Nursing note and vitals reviewed. Constitutional: She appears well-developed and well-nourished. No distress.  HENT:   Head: Normocephalic and atraumatic.  Eyes: Conjunctivae are normal. Right eye exhibits no discharge. Left eye exhibits no discharge.  Neck: Neck supple.  Cardiovascular: Normal rate, regular rhythm and normal heart sounds.  Exam reveals no gallop and no friction rub.   No murmur heard. Pulmonary/Chest: Effort normal and breath sounds normal. No respiratory distress.  Abdominal: Soft. She exhibits no distension. There is no tenderness.  Musculoskeletal: She exhibits no edema and no tenderness.  Neurological: She is alert.  Skin: Skin is warm and dry.  Psychiatric: She has a normal mood and affect. Her behavior is normal. Thought content normal.    ED Course  Procedures (including critical care time) Labs Review Labs Reviewed  CBC WITH DIFFERENTIAL - Abnormal; Notable for the following:    WBC 3.9 (*)    MCH 35.4 (*)    MCHC 36.2 (*)    RDW 16.0 (*)    Neutrophils Relative % 40 (*)    Neutro Abs 1.6 (*)  Monocytes Relative 22 (*)    All other components within normal limits  BASIC METABOLIC PANEL - Abnormal; Notable for the following:    Sodium 135 (*)    Chloride 92 (*)    Glucose, Bld 108 (*)    Creatinine, Ser 1.35 (*)    GFR calc non Af Amer 41 (*)    GFR calc Af Amer 47 (*)    All other components within normal limits  PRO B NATRIURETIC PEPTIDE - Abnormal; Notable for the following:    Pro B Natriuretic peptide (BNP) 754.3 (*)    All other components within normal limits  TROPONIN I   Imaging Review Dg Chest 2 View  04/22/2013   CLINICAL DATA:  Chest pain  EXAM: CHEST  2 VIEW  COMPARISON:  DG CHEST 1V PORT dated 04/10/2013  FINDINGS: The heart size and mediastinal contours are within normal limits. Both lungs are clear. The visualized skeletal structures are unremarkable.  IMPRESSION: No active cardiopulmonary disease.   Electronically Signed   By: Kathreen Devoid   On: 04/22/2013 13:11   EKG:  Rhythm: normal sinus Rate: 68 Left Axis Deviation Abnormal R wave  progression Inferior and lateral q waves ST segments: NSST changes Comparison: no acute changes   EKG Interpretation   None       MDM   1. Chest pain     63 year old female with chest pain. Some concerning features. Noted that patient evaluated multiple times for similar complaints as her presenting symptoms today. This previously was in context of elevated HR though (afib/flutter w/RVR) with no objective evidence of this today. Hospitalization in December and again approximately 2 weeks ago. Sounds like plan was to follow-up for stress and further discussion of possible ablation.   Virgel Manifold, MD 04/22/13 [phone]

## 2013-04-22 NOTE — Consult Note (Signed)
CARDIOLOGY CONSULT NOTE   Patient ID: [name] MRN: [id number], DOB/AGE: April 11, 1950 63 y.o. Date of Encounter: 04/22/2013  Primary Physician: [name], MD Primary Cardiologist: HS/JA (also has seen SK/GT)  Chief Complaint:  Chest pain  HPI: [name] is a 63 y.o. female with a history of CAD/ICM. Hx ICD with wound erosion and subsequent explantation. She was admitted 12/22-23 with chest pain, discharged and office f/u scheduled.   Seen 01/12 and was in atrial rhythm, HR 107, flutter waves upright in V1.   Echo 01/19, EF 20%, but stable.   Admitted 01/21-22 with chest pain and atrial fib or flutter, initial HR 150s. Spontaneous conversion to SR.   Had appt with Dr. Rayann Heman today at noon.   Woke up yesterday with general malaise, just did not feel well. No chest pain, has chronic back pain (no difference). Had nausea and belching, no vomiting. No SOB, no recent weight gain or edema. She denies orthopnea or PND. She had no palpitations, but did not check her BP/HR on her home machine. Compliant with home Rx, no med changes since d/c 01/22. She did not try OTC meds because she thought they would not stay down. She took 1 SL NTG yesterday and it helped. Today she was nauseated and weak. She was trying to wait till her appt with Dr. Rayann Heman, however, she was so weak and nauseated, she came to the ER. She was unable to eat or even drink water. She did not take her medication because she did not think it would stay down.   In the ER, she is belching frequently, afebrile, abdomen is tender but that is chronic. No diarrhea, mild constipation but not bad enough to take Miralax, which she will use on occasion. No chest pain, no SOB, no palpitations.   Of note, TSH was 15.206 on December 22, but was 2.190 on 01/21. She is on Synthroid 200 mcg daily, but has not had it today. Free T4 not checked in December but was normal in January.  Past Medical History  Diagnosis Date  . Spinal  stenosis   . Numbness of foot     left foot  . Diverticulitis     a. s/p partial colectomy 1/12 with reversal in July 2012.  . Colitis, ischemic   . DJD (degenerative joint disease)     History of multiple surgeries to the back, shoulder and knee  . Ischemic cardiomyopathy     a. Echo 06/16/10: EF 25-30%, anteroseptal and apical hypokinesis, moderate AI, mild MR.  . Chronic systolic heart failure   . CAD (coronary artery disease)     a.  Ant MI 8/03 with stenting of the LAD;  b. staged PCI with Cypher DES to OM1 8/03;  c. s/p Cypher DES to LAD 7/04;  d.  multiple cardiac caths in past (chronically abnl ECG);    e. cardiac catheterization 3/12: LAD stent patent, distal LAD 40-50%, small ostial D1 80%, ostial D2 60%, OM-1 stent patent (20-30%), proximal RCA 50%, proximal to mid RCA 40-50%; f. cath 02/17/2011 - nonobs  . LV (left ventricular) mural thrombus   . CKD (chronic kidney disease), stage III     creatinine:  1.3 in 4/12;    . Tobacco abuse     history  . GERD (gastroesophageal reflux disease)   . Hypertension   . Lower GI bleed     August 2011  . Hypothyroidism   . Aortic insufficiency   .  Pseudoaneurysm     History of, right forearm  . Hyperlipidemia   . Compression fracture 07/04/11    L2  . 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 AICD (automatic cardioverter/defibrillator) present     Implanted for primary prevention. Device revision 2006. Repeat operation at [facility] wtih chronic smoldering infection 11/2012 s/p device extraction and subsequent LifeVest.  . PAF (paroxysmal atrial fibrillation)   . Anatomical narrow angle of right eye   . Blood transfusion     "must have been when I had the total knee replacement" (04/10/2013)  . Anemia   . H/O hiatal hernia   . Sinus bradycardia 03/05/2012  . Paroxysmal atrial flutter   . Mitral regurgitation   . Heart murmur   .  CHF (congestive heart failure)     "3-4 times" (04/10/2013)  . Myocardial infarction 2002  . Stroke     History of TIA and possible CVA; hospitalized 2004; on coumadin  . Stroke     "I've had 3"; denies residual on 04/10/2013  . Chronic lower back pain     Surgical History:  Past Surgical History  Procedure Laterality Date  . Back surgery    . Cardiac defibrillator placement  07/2003; 10/2004  . Lumbar laminectomy/decompression microdiscectomy  10/2001    L5-S1/E-chart  . Knee arthroscopy Right   . Thyroidectomy  1970's  . Total knee arthroplasty Left 11/2003  . X-stop implantation  12/2004    L3-4; L4-5  . Fixation kyphoplasty thoracic spine  07/2007    T3, 4, 6 compression fractures  . Shoulder open rotator cuff repair Right 04/2008  . Lumbar laminectomy/decompression microdiscectomy  01/2010  . Colectomy  03/2010    sigmoid left  . Colostomy  03/2010    transverse  . Peripherally inserted central catheter insertion  03/2010    removed upon discharge  . Colostomy closure  12/2010    reversal  . Tonsillectomy and adenoidectomy  1969  . Appendectomy  03/2010  . Glaucoma surgery Right   . Biv icd genertaor change out  12/17/2012    "right now I don't have a defibrillator; I've been using an external one" (04/10/2013)  . Icd lead removal Left 12/17/2012    Procedure: ICD LEAD REMOVAL;  Surgeon: Evans Lance, MD;  Location: [facility];  Service: Cardiovascular;  Laterality: Left;  . Coronary angioplasty with stent placement  10/2001; 09/2002; ?date    "I've got a total of 4" (04/10/2013)     I have reviewed the patient's current medications. Prior to Admission medications   Medication Sig Start Date End Date Taking? Authorizing Provider  apixaban (ELIQUIS) 5 MG TABS tablet Take 5 mg by mouth 2 (two) times daily.   Yes Historical Provider, MD  aspirin 81 MG chewable tablet Chew 324 mg by mouth once.   Yes Historical Provider, MD  aspirin EC 81 MG tablet Take 81 mg by mouth daily.   Yes  Historical Provider, MD  carvedilol (COREG) 6.25 MG tablet Take 6.25 mg by mouth 2 (two) times daily with a meal. 06/08/12  Yes Belva Crome III, MD  diltiazem (CARDIZEM) 60 MG tablet Take 1 tablet (60 mg total) by mouth daily as needed. Take 1 tablet (60 mg) daily as needed for sustained racing palpitations. 04/11/13  Yes Brooke O Edmisten, PA-C  ezetimibe (ZETIA) 10 MG tablet Take 10 mg by mouth at bedtime.  Yes Minus Breeding, MD  fexofenadine (ALLEGRA) 180 MG tablet Take 180 mg by mouth daily as needed for allergies.    Yes Historical Provider, MD  fluticasone (FLONASE) 50 MCG/ACT nasal spray Place 2 sprays into both nostrils daily as needed for allergies.    Yes Historical Provider, MD  folic acid-pyridoxine-cyancobalamin (FOLTX) 2.5-25-2 MG TABS Take 1 tablet by mouth daily.    Yes Historical Provider, MD  furosemide (LASIX) 20 MG tablet Take 20 mg by mouth daily.   Yes Historical Provider, MD  levothyroxine (SYNTHROID, LEVOTHROID) 200 MCG tablet Take 200 mcg by mouth daily before breakfast.    Yes Historical Provider, MD  nitroGLYCERIN (NITROSTAT) 0.4 MG SL tablet Place 0.4 mg under the tongue every 5 (five) minutes as needed for chest pain.    Yes Historical Provider, MD  omega-3 acid ethyl esters (LOVAZA) 1 G capsule Take 1 g by mouth daily.   Yes Historical Provider, MD  polyethylene glycol (MIRALAX / GLYCOLAX) packet Take 17 g by mouth daily as needed (for constipation).    Yes Historical Provider, MD  raloxifene (EVISTA) 60 MG tablet Take 60 mg by mouth daily.   Yes Historical Provider, MD  spironolactone (ALDACTONE) 25 MG tablet Take 25 mg by mouth daily.   Yes Historical Provider, MD  pantoprazole (PROTONIX) 40 MG tablet Take 40 mg by mouth 2 (two) times daily as needed (gerd).     Historical Provider, MD   Scheduled Meds: . nitroGLYCERIN  0.5 inch Topical Once   Continuous Infusions:  PRN Meds:.gi cocktail  Allergies:  Allergies  Allergen Reactions  . Coconut Oil Anaphylaxis  and Hives  . Lansoprazole Nausea Only  . Penicillins Swelling    Throat swells  . Statins Other (See Comments)    cramps  . Lisinopril     Cough- but still tolerates it  . Lactose Intolerance (Gi) Diarrhea and Other (See Comments)    Patient reports abdominal cramping and diarrhea with lactose products.    History   Social History  . Marital Status: Widowed    Spouse Name: N/A    Number of Children: N/A  . Years of Education: N/A   Occupational History  . Retired    Social History Main Topics  . Smoking status: Former Smoker -- 0.10 packs/day for 33 years    Types: Cigarettes    Quit date: 04/21/2001  . Smokeless tobacco: Never Used     Comment: "smoked ~ 1 pack/month"  . Alcohol Use: No     Comment: 07/05/11 "occasionally; last time was glass of wine last week"  . Drug Use: No  . Sexual Activity: Not Currently   Other Topics Concern  . Not on file   Social History Narrative   Lives alone, has a house, has some household help. Handicapped upfitted.    Family History  Problem Relation Age of Onset  . Diabetes Mother   . Diabetes Brother   . Arthritis      family history  . Prostate cancer      Family History   Family Status  Relation Status Death Age  . Mother Deceased   . Father Deceased   . Brother Alive     Review of Systems:   Full 14-point review of systems otherwise negative except as noted above.  Physical Exam: Blood pressure 98/67, pulse 55, temperature 98.2 F (36.8 C), temperature source Oral, resp. rate 21, height 5\' 6"  (1.676 m), weight 142 lb (64.411 kg), SpO2 99.00%. General: Well  developed, well nourished,female in no acute distress. Head: Normocephalic, atraumatic, sclera non-icteric, no xanthomas, nares are without discharge. Dentition: poor; slight exophthalmos (?old) Neck: No carotid bruits. JVD not elevated. No thyromegally Lungs: Good expansion bilaterally. without wheezes or rhonchi.  Heart: Regular rate and rhythm with S1 S2.  No  S3 or S4.  2/6 murmur, no rubs, or gallops appreciated. Abdomen: Soft, diffusely tender, non-distended with normoactive bowel sounds. No hepatomegaly. No rebound/guarding. No obvious abdominal masses. Msk:  Strength and tone appear normal for age. No joint deformities or effusions, no spine or costo-vertebral angle tenderness. Extremities: No clubbing or cyanosis. No edema.  Distal pedal pulses are 2+ in 4 extrem Neuro: Alert and oriented X 3. Moves all extremities spontaneously. No focal deficits noted. Psych:  Responds to questions appropriately with a normal affect. Skin: No rashes or lesions noted  Labs:   Lab Results  Component Value Date   WBC 3.9* 04/22/2013   HGB 14.6 04/22/2013   HCT 40.3 04/22/2013   MCV 97.6 04/22/2013   PLT 151 04/22/2013     Recent Labs Lab 04/22/13 1200  NA 135*  K 4.3  CL 92*  CO2 27  BUN 18  CREATININE 1.35*  CALCIUM 8.8  GLUCOSE 108*    Recent Labs  04/22/13 1200  TROPONINI <0.30   Pro B Natriuretic peptide (BNP)  Date/Time Value Range Status  04/22/2013 12:00 PM 754.3* 0 - 125 pg/mL Final  04/10/2013  3:57 PM 3238.0* 0 - 125 pg/mL Final    Radiology/Studies: Dg Chest 2 View 04/22/2013   CLINICAL DATA:  Chest pain  EXAM: CHEST  2 VIEW  COMPARISON:  DG CHEST 1V PORT dated 04/10/2013  FINDINGS: The heart size and mediastinal contours are within normal limits. Both lungs are clear. The visualized skeletal structures are unremarkable.  IMPRESSION: No active cardiopulmonary disease.   Electronically Signed   By: Kathreen Devoid   On: 04/22/2013 13:11    ECG: Sinus bradycardia, no acute ischemic changes  ASSESSMENT AND PLAN:  Active Problems:   Weakness, nausea: mgt per IM, but tried GI cocktail (she vomited most of that up) and will ck LFTs, TSH.    Chronic systolic heart failure - Volume status is good, Cr up a little, on Lasix 20 mg and Aldactone 25 mg daily. Hold for today, recheck labs in am and decide on diuretic resumption.    HTN (hypertension)  - follow, hypotensive now. Has not had home Rx. Per IM.    Hypothyroidism - TSH up in December, nl in January, recheck    PAF (paroxysmal atrial fibrillation) -  No palpitations, SR, sinus brady now, HR 50s, should not cause this level of symptoms, continue Rx. Previous admissions were associated with palpitations and atrial fib or flutter on ECG. Keep on telemetry, may have had arrhythmia before put on monitor.   Jonetta Speak, PA-C 04/22/2013 4:01 PM Beeper [phone]   Patient examined chart reviewed Vague complaints of nausea and fatigue.  Clincially she is euvolemic with no congestion  Has not had palpitations and is in NSR.  No chest pain BNP much improved from 04/10/13  Abdomen not acute.  Check TSH.  Will have medicine admit .  Does not appear to be active cardiac issue causing her symptoms  Check LFTls  No signs of low output   Jenkins Rouge

## 2013-04-22 NOTE — H&P (Signed)
[facility] History and Physical  ANGLE DOTO [id number] DOB: 1951/02/20 DOA: 04/22/2013  Referring physician:  PCP: [name], MD  Specialists: Dr. Daneen Schick is a Dr. Rayann Heman, cardiology  Chief Complaint: Chest pain and weakness  HPI: [name] is a 63 y.o. female  With a history of chronic systolic heart failure and ischemic cardiomyopathy, hypertension, coronary disease that presents to the emergency department with a complaint of chest pain as well as generalized weakness. Patient states she woke up yesterday. The first 2015 and did not feel well. She states that she thought she had indigestion. She had a negative pain in the middle of her chest which did not radiate anywhere. Patient does state she became short winded she walked around the house. She denies any diaphoresis or dizziness. Patient states that she's had the feeling of nausea as well as chronic belching, she denies any actual vomiting. Patient denies any orthopnea or PND, denies any palpitations. Patient states that she has frequently had heart problems. She did take 1 nitroglycerin tablet which did seem to use her pain some. Today patient also had similar type of pain as she did yesterday when she didn't improve so nitroglycerin. This morning she had some diaphoresis with some back pain as well. Patient was given nitroglycerin by EMS in full dose aspirin which again did improve her pain. Patient does have a history of paroxysmal atrial fibrillation for which she takes Eliquis.  Of note patient was given a GI cocktail in the emergency department which did cause her to vomit. Patient denies any recent travel, or sick contacts.  Review of Systems:  Constitutional: Denies fever, chills, diaphoresis, appetite change and fatigue.  HEENT: Denies photophobia, eye pain, redness, hearing loss, ear pain, congestion, sore throat, rhinorrhea, sneezing, mouth sores, trouble swallowing, neck pain, neck stiffness and tinnitus.    Respiratory: Denies SOB, DOE, cough, chest tightness,  and wheezing.   Cardiovascular: Patient states she's had some chest pain please see history of present illness. Gastrointestinal: Denies any abdominal pain. Does complain of some nausea and one episode of vomiting. Denies any diarrhea. Genitourinary: Denies dysuria, urgency, frequency, hematuria, flank pain and difficulty urinating.  Musculoskeletal: Denies myalgias, back pain, joint swelling, arthralgias and gait problem.  Skin: Denies pallor, rash and wound.  Neurological: Denies dizziness, seizures, syncope, weakness, light-headedness, numbness and headaches.  Hematological: Denies adenopathy. Easy bruising, personal or family bleeding history  Psychiatric/Behavioral: Denies suicidal ideation, mood changes, confusion, nervousness, sleep disturbance and agitation  Past Medical History  Diagnosis Date  . Spinal stenosis   . Numbness of foot     left foot  . Diverticulitis     a. s/p partial colectomy 1/12 with reversal in July 2012.  . Colitis, ischemic   . DJD (degenerative joint disease)     History of multiple surgeries to the back, shoulder and knee  . Ischemic cardiomyopathy     a. Echo 06/16/10: EF 25-30%, anteroseptal and apical hypokinesis, moderate AI, mild MR.  . Chronic systolic heart failure   . CAD (coronary artery disease)     a.  Ant MI 8/03 with stenting of the LAD;  b. staged PCI with Cypher DES to [place] 8/03;  c. s/p Cypher DES to LAD 7/04;  d.  multiple cardiac caths in past (chronically abnl ECG);    e. cardiac catheterization 3/12: LAD stent patent, distal LAD 40-50%, small ostial D1 80%, ostial D2 60%, OM-1 stent patent (20-30%), proximal RCA 50%, proximal to mid RCA  40-50%; f. cath 02/17/2011 - nonobs  . LV (left ventricular) mural thrombus   . CKD (chronic kidney disease), stage III     creatinine:  1.3 in 4/12;    . Tobacco abuse     history  . GERD (gastroesophageal reflux disease)   . Hypertension   .  Lower GI bleed     August 2011  . Hypothyroidism   . Aortic insufficiency   . Pseudoaneurysm     History of, right forearm  . Hyperlipidemia   . Compression fracture 07/04/11    L2  . 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 AICD (automatic cardioverter/defibrillator) present     Implanted for primary prevention. Device revision 2006. Repeat operation at [facility] wtih chronic smoldering infection 11/2012 s/p device extraction and subsequent LifeVest.  . PAF (paroxysmal atrial fibrillation)   . Anatomical narrow angle of right eye   . Blood transfusion     "must have been when I had the total knee replacement" (04/10/2013)  . Anemia   . H/O hiatal hernia   . Sinus bradycardia 03/05/2012  . Paroxysmal atrial flutter   . Mitral regurgitation   . Heart murmur   . CHF (congestive heart failure)     "3-4 times" (04/10/2013)  . Myocardial infarction 2002  . Stroke     History of TIA and possible CVA; hospitalized 2004; on coumadin  . Stroke     "I've had 3"; denies residual on 04/10/2013  . Chronic lower back pain    Past Surgical History  Procedure Laterality Date  . Back surgery    . Cardiac defibrillator placement  07/2003; 10/2004  . Lumbar laminectomy/decompression microdiscectomy  10/2001    L5-S1/E-chart  . Knee arthroscopy Right   . Thyroidectomy  1970's  . Total knee arthroplasty Left 11/2003  . X-stop implantation  12/2004    L3-4; L4-5  . Fixation kyphoplasty thoracic spine  07/2007    T3, 4, 6 compression fractures  . Shoulder open rotator cuff repair Right 04/2008  . Lumbar laminectomy/decompression microdiscectomy  01/2010  . Colectomy  03/2010    sigmoid left  . Colostomy  03/2010    transverse  . Peripherally inserted central catheter insertion  03/2010    removed upon discharge  . Colostomy closure  12/2010    reversal  . Tonsillectomy and adenoidectomy  1969  .  Appendectomy  03/2010  . Glaucoma surgery Right   . Biv icd genertaor change out  12/17/2012    "right now I don't have a defibrillator; I've been using an external one" (04/10/2013)  . Icd lead removal Left 12/17/2012    Procedure: ICD LEAD REMOVAL;  Surgeon: Evans Lance, MD;  Location: [place];  Service: Cardiovascular;  Laterality: Left;  . Coronary angioplasty with stent placement  10/2001; 09/2002; ?date    "I've got a total of 4" (04/10/2013)   Social History:  reports that she quit smoking about 12 years ago. Her smoking use included Cigarettes. She has a 3.3 pack-year smoking history. She has never used smokeless tobacco. She reports that she does not drink alcohol or use illicit drugs. Patient lives at home alone. She does state that she has good family support. She uses a walker for ambulation.  Allergies  Allergen Reactions  . Coconut Oil Anaphylaxis and Hives  . Lansoprazole Nausea Only  . Penicillins Swelling  Throat swells  . Statins Other (See Comments)    cramps  . Lisinopril     Cough- but still tolerates it  . Lactose Intolerance (Gi) Diarrhea and Other (See Comments)    Patient reports abdominal cramping and diarrhea with lactose products.     Family History  Problem Relation Age of Onset  . Diabetes Mother   . Diabetes Brother   . Arthritis      family history  . Prostate cancer      Family History    Prior to Admission medications   Medication Sig Start Date End Date Taking? Authorizing Provider  apixaban (ELIQUIS) 5 MG TABS tablet Take 5 mg by mouth 2 (two) times daily.   Yes Historical Provider, MD  aspirin 81 MG chewable tablet Chew 324 mg by mouth once.   Yes Historical Provider, MD  aspirin EC 81 MG tablet Take 81 mg by mouth daily.   Yes Historical Provider, MD  carvedilol (COREG) 6.25 MG tablet Take 6.25 mg by mouth 2 (two) times daily with a meal. 06/08/12  Yes Belva Crome III, MD  diltiazem (CARDIZEM) 60 MG tablet Take 1 tablet (60 mg total) by  mouth daily as needed. Take 1 tablet (60 mg) daily as needed for sustained racing palpitations. 04/11/13  Yes Brooke O Edmisten, PA-C  ezetimibe (ZETIA) 10 MG tablet Take 10 mg by mouth at bedtime.    Yes Minus Breeding, MD  fexofenadine (ALLEGRA) 180 MG tablet Take 180 mg by mouth daily as needed for allergies.    Yes Historical Provider, MD  fluticasone (FLONASE) 50 MCG/ACT nasal spray Place 2 sprays into both nostrils daily as needed for allergies.    Yes Historical Provider, MD  folic acid-pyridoxine-cyancobalamin (FOLTX) 2.5-25-2 MG TABS Take 1 tablet by mouth daily.    Yes Historical Provider, MD  furosemide (LASIX) 20 MG tablet Take 20 mg by mouth daily.   Yes Historical Provider, MD  levothyroxine (SYNTHROID, LEVOTHROID) 200 MCG tablet Take 200 mcg by mouth daily before breakfast.    Yes Historical Provider, MD  nitroGLYCERIN (NITROSTAT) 0.4 MG SL tablet Place 0.4 mg under the tongue every 5 (five) minutes as needed for chest pain.    Yes Historical Provider, MD  omega-3 acid ethyl esters (LOVAZA) 1 G capsule Take 1 g by mouth daily.   Yes Historical Provider, MD  polyethylene glycol (MIRALAX / GLYCOLAX) packet Take 17 g by mouth daily as needed (for constipation).    Yes Historical Provider, MD  raloxifene (EVISTA) 60 MG tablet Take 60 mg by mouth daily.   Yes Historical Provider, MD  spironolactone (ALDACTONE) 25 MG tablet Take 25 mg by mouth daily.   Yes Historical Provider, MD  pantoprazole (PROTONIX) 40 MG tablet Take 40 mg by mouth 2 (two) times daily as needed (gerd).     Historical Provider, MD   Physical Exam: Filed Vitals:   04/22/13 1445  BP: 98/67  Pulse: 55  Temp:   Resp: 21     General: Well developed, well nourished, NAD, appears stated age  HEENT: NCAT, PERRLA, EOMI, Anicteic Sclera, mucous membranes moist. No pharyngeal erythema or exudates  Neck: Supple, no JVD, no masses  Cardiovascular: S1 S2 auscultated,1/6SEM, Regular rate and rhythm.  Respiratory: Clear  to auscultation bilaterally with equal chest rise  Abdomen: Soft, nontender, nondistended, + bowel sounds  Extremities: warm dry without cyanosis clubbing or edema  Neuro: AAOx3, cranial nerves grossly intact. Strength 5/5 in patient's upper and lower extremities  bilaterally  Skin: Without rashes exudates or nodules  Psych: Normal affect and demeanor with intact judgement and insight  Labs on Admission:  Basic Metabolic Panel:  Recent Labs Lab 04/22/13 1200  NA 135*  K 4.3  CL 92*  CO2 27  GLUCOSE 108*  BUN 18  CREATININE 1.35*  CALCIUM 8.8   Liver Function Tests: No results found for this basename: AST, ALT, ALKPHOS, BILITOT, PROT, ALBUMIN,  in the last 168 hours No results found for this basename: LIPASE, AMYLASE,  in the last 168 hours No results found for this basename: AMMONIA,  in the last 168 hours CBC:  Recent Labs Lab 04/22/13 1200  WBC 3.9*  NEUTROABS 1.6*  HGB 14.6  HCT 40.3  MCV 97.6  PLT 151   Cardiac Enzymes:  Recent Labs Lab 04/22/13 1200  TROPONINI <0.30    BNP (last 3 results)  Recent Labs  03/11/13 2050 04/10/13 1557 04/22/13 1200  PROBNP 2015.0* 3238.0* 754.3*   CBG: No results found for this basename: GLUCAP,  in the last 168 hours  Radiological Exams on Admission: Dg Chest 2 View  04/22/2013   CLINICAL DATA:  Chest pain  EXAM: CHEST  2 VIEW  COMPARISON:  DG CHEST 1V PORT dated 04/10/2013  FINDINGS: The heart size and mediastinal contours are within normal limits. Both lungs are clear. The visualized skeletal structures are unremarkable.  IMPRESSION: No active cardiopulmonary disease.   Electronically Signed   By: Kathreen Devoid   On: 04/22/2013 13:11    EKG: Independently reviewed. Sinus bradycardia, rate 53, no changes from prior EKGs.  Assessment/Plan  Chest pain, atypical Patient will be admitted to telemetry floor. Cardiology is already been consulted. Continue to trend patient's troponins. First troponin was negative, no  EKG changes. Wil obtain full lipid panel, TSH, magnesium, phosphorus levels. Her chest pain does sound atypical in nature.  Acute kidney injury with hyponatremia and hypochloremia Likely secondary to dehydration as well as diuretic use. Will hold her Lasix as well as spironolactone at this time. We'll gently hydrate patient and continue to monitor her BMP.  Nausea  Uknown etiology at this time. Patient did have one episode of vomiting after receiving a GI cocktail. She does continue to belch frequently. Will continue her Protonix. May consider GI consult if not improved. Will also obtain LFTs.  Hypothyroidism Will continue patient's Synthroid, we'll also obtain a TSH level.  Paroxysmal Atrial fibrillation Currently rate and rhythm control. Will continue Coreg as well as Eliquis as well as diltiazem.  Generalized weakness Will obtain vitamin B12 as well as folate levels. Will also order physical therapy to evaluate and treat. Patient does state she uses a walker.  Hyperlipidemia Will continue Zetia and will place a.  Patient has an allergy to statins.  Systolic congestive heart failure, ischemic cardiomyopathy Patient is currently not in exacerbation, and is compensated. Patient had echocardiogram on 04/08/2013 showing an EF of 20-25%. Will hold patient's spironolactone as well as Lasix in lieu of her acute kidney injury and dehydration. Will gently rehydrate patient. Will monitor her intake and output as well as daily weights and placed on a fluid restriction.  DVT prophylaxis: Lovenox  Code Status: Full  Condition: Gaurded  Family Communication: Brother at bedside.  Admission, patients condition and plan of care including tests being ordered have been discussed with the patient and brother who indicate understanding and agree with the plan and Code Status.  Disposition Plan: Admitted for observation   Time spent: 36  minutes  Jenalyn Girdner D.O. [facility] Pager  [phone]  If 7PM-7AM, please contact night-coverage [email or web address] Password [facility] 04/22/2013, 4:43 PM

## 2013-04-23 DIAGNOSIS — I4891 Unspecified atrial fibrillation: Secondary | ICD-10-CM

## 2013-04-23 DIAGNOSIS — R079 Chest pain, unspecified: Secondary | ICD-10-CM

## 2013-04-23 DIAGNOSIS — I4892 Unspecified atrial flutter: Secondary | ICD-10-CM

## 2013-04-23 LAB — TROPONIN I
Troponin I: <0.3 ng/mL (ref <0.30)
Troponin I: <0.3 ng/mL (ref <0.30)

## 2013-04-23 LAB — TSH: TSH: 1.356 u[IU]/mL (ref 0.350–4.500)

## 2013-04-23 LAB — FOLATE RBC: RBC Folate: 639 ng/mL — ABNORMAL HIGH (ref >280)

## 2013-04-23 NOTE — Progress Notes (Signed)
D/c order received;IV removed with gauze on, pt remains in stable condition, pt meds and instructions reviewed and given to pt; pt d/c to home

## 2013-04-23 NOTE — Progress Notes (Signed)
Patient Name: [name] Date of Encounter: 04/23/2013  Active Problems:   Chronic systolic heart failure   HTN (hypertension)   Hypothyroidism   Chest pain   PAF (paroxysmal atrial fibrillation)    SUBJECTIVE: N&V have resolved, she ate solid food today.   OBJECTIVE Filed Vitals:   04/23/13 0419 04/23/13 0746 04/23/13 0816 04/23/13 1004  BP: 100/48 92/60 97/61  101/57  Pulse: 63  64   Temp: 98.4 F (36.9 C)  97.9 F (36.6 C)   TempSrc: Oral  Oral   Resp: 18  18   Height:      Weight:      SpO2: 100%  98%     Intake/Output Summary (Last 24 hours) at 04/23/13 1131 Last data filed at 04/23/13 0300  Gross per 24 hour  Intake    240 ml  Output      0 ml  Net    240 ml   Filed Weights   04/22/13 1135 04/22/13 1948  Weight: 142 lb (64.411 kg) 140 lb 1.6 oz (63.549 kg)    PHYSICAL EXAM General: Well developed, well nourished, female in no acute distress. Head: Normocephalic, atraumatic.  Neck: Supple without bruits, JVD not elevated. Lungs:  Resp regular and unlabored, few dry rales, good air exchange. Heart: RRR, S1, S2, no S3, S4, 2/6 murmur; no rub. Abdomen: Soft, non-tender, non-distended, BS + x 4.  Extremities: No clubbing, cyanosis, no edema.  Neuro: Alert and oriented X 3. Moves all extremities spontaneously. Psych: Normal affect.  LABS: CBC: Recent Labs  04/22/13 1200 04/22/13 2128  WBC 3.9* 3.3*  NEUTROABS 1.6*  --   HGB 14.6 14.2  HCT 40.3 39.4  MCV 97.6 98.7  PLT 151 169*   Basic Metabolic Panel: Recent Labs  04/22/13 1200 04/22/13 2128  NA 135*  --   K 4.3  --   CL 92*  --   CO2 27  --   GLUCOSE 108*  --   BUN 18  --   CREATININE 1.35* 1.21*  CALCIUM 8.8  --   MG  --  1.9  PHOS  --  3.9   Liver Function Tests: Recent Labs  04/22/13 1714  AST 84*  ALT 37*  ALKPHOS 67  BILITOT 0.9  PROT 7.6  ALBUMIN 3.5   Cardiac Enzymes: Recent Labs  04/22/13 2128 04/23/13 0242 04/23/13 0835  TROPONINI <0.30 <0.30 <0.30    BNP: Pro B Natriuretic peptide (BNP)  Date/Time Value Range Status  04/22/2013 12:00 PM 754.3* 0 - 125 pg/mL Final  04/10/2013  3:57 PM 3238.0* 0 - 125 pg/mL Final   Fasting Lipid Panel: Recent Labs  04/22/13 2128  CHOL 226*  HDL 119  LDLCALC 93  TRIG 68  CHOLHDL 1.9   Thyroid Function Tests: Recent Labs  04/22/13 1714  TSH 1.635   Anemia Panel: Recent Labs  04/22/13 1714  VITAMINB12 884       Radiology/Studies: Dg Chest 2 View 04/22/2013   CLINICAL DATA:  Chest pain  EXAM: CHEST  2 VIEW  COMPARISON:  DG CHEST 1V PORT dated 04/10/2013  FINDINGS: The heart size and mediastinal contours are within normal limits. Both lungs are clear. The visualized skeletal structures are unremarkable.  IMPRESSION: No active cardiopulmonary disease.   Electronically Signed   By: Kathreen Devoid   On: 04/22/2013 13:11     Current Medications:  . apixaban  5 mg Oral BID  . aspirin EC  81 mg Oral  Daily  . carvedilol  6.25 mg Oral BID WC  . ezetimibe  10 mg Oral QHS  . folic acid-pyridoxine-cyancobalamin  1 tablet Oral Daily  . levothyroxine  200 mcg Oral QAC breakfast  . nitroGLYCERIN  0.5 inch Topical Once  . omega-3 acid ethyl esters  1 g Oral Daily  . raloxifene  60 mg Oral Daily   . sodium chloride 50 mL/hr at 04/22/13 1725    ASSESSMENT AND PLAN:   N&V - resolved, OK for d/c per primary MD  Active Problems:   Chronic systolic heart failure - need today's weight, continue to follow Arlyce Harman restarted but Lasix held so will arrange f/u appt.    HTN (hypertension) - per IM    Hypothyroidism - TSH is OK    Chest pain - has had atypical angina in past so symptoms were concerning for angina. However, ECG with no new changes and ez negative despite > 24 hours of symptoms. She has a stress test scheduled later this week, keep appt if doing OK and will arrange f/u appt after that.    PAF (paroxysmal atrial fibrillation) - none observed overnight by reports, follow.  Signed, Rosaria Ferries , PA-C 11:31 AM 04/23/2013  Patient feels fine malaise and fatigue and nausea gone.  Cardiac statis normal  Outpatient myovue Thursday and f/u with Dr Tamala Julian.    Jenkins Rouge

## 2013-04-23 NOTE — Discharge Summary (Signed)
Physician Discharge Summary  [name] ZWC:[id number] DOB: 1950/07/28 DOA: 04/22/2013  PCP: [name], MD  Admit date: 04/22/2013 Discharge date: 04/23/2013  Time spent: 45 minutes  Recommendations for Outpatient Follow-up:  -Will be discharged home today. -Has OP stress myoview scheduled in 2 days with Dr. Tamala Julian.   Discharge Diagnoses:  Active Problems:   Chronic systolic heart failure   HTN (hypertension)   Hypothyroidism   Chest pain   PAF (paroxysmal atrial fibrillation)   Discharge Condition: Stable and improved  Filed Weights   04/22/13 1135 04/22/13 1948  Weight: 64.411 kg (142 lb) 63.549 kg (140 lb 1.6 oz)    History of present illness:  [name] is a 63 y.o. female  With a history of chronic systolic heart failure and ischemic cardiomyopathy, hypertension, coronary disease that presents to the emergency department with a complaint of chest pain as well as generalized weakness. Patient states she woke up yesterday. The first 2015 and did not feel well. She states that she thought she had indigestion. She had a negative pain in the middle of her chest which did not radiate anywhere. Patient does state she became short winded she walked around the house. She denies any diaphoresis or dizziness. Patient states that she's had the feeling of nausea as well as chronic belching, she denies any actual vomiting. Patient denies any orthopnea or PND, denies any palpitations. Patient states that she has frequently had heart problems. She did take 1 nitroglycerin tablet which did seem to use her pain some. Today patient also had similar type of pain as she did yesterday when she didn't improve so nitroglycerin. This morning she had some diaphoresis with some back pain as well. Patient was given nitroglycerin by EMS in full dose aspirin which again did improve her pain. Patient does have a history of paroxysmal atrial fibrillation for which she takes Eliquis. Of note patient was given  a GI cocktail in the emergency department which did cause her to vomit. Patient denies any recent travel, or sick contacts. Hospitalist admission was requested.   Hospital Course:   Chest Pain -Resolved. -Ruled out for ACS by EKG and enzymes. -Already has an OP stress myoview scheduled in 2 days; advised to keep this appointment.  ARF -Improved. -Cr down to 1.21 on DC.  Rest of chronic medical problems have been stable this admission.  Procedures:  None   Consultations:  Cardiology.  Discharge Instructions  Discharge Orders   Future Appointments Provider Department Dept Phone   04/25/2013 9:30 AM [facility] 2 (Nuc Treadm) [place] [facility] [phone]   Future Orders Complete By Expires   Diet - low sodium heart healthy  As directed    Discontinue IV  As directed    Increase activity slowly  As directed        Medication List    STOP taking these medications       furosemide 20 MG tablet  Commonly known as:  LASIX      TAKE these medications       aspirin EC 81 MG tablet  Take 81 mg by mouth daily.     carvedilol 6.25 MG tablet  Commonly known as:  COREG  Take 6.25 mg by mouth 2 (two) times daily with a meal.     diltiazem 60 MG tablet  Commonly known as:  CARDIZEM  Take 1 tablet (60 mg total) by mouth daily as needed. Take 1 tablet (60 mg)  daily as needed for sustained racing palpitations.     ELIQUIS 5 MG Tabs tablet  Generic drug:  apixaban  Take 5 mg by mouth 2 (two) times daily.     ezetimibe 10 MG tablet  Commonly known as:  ZETIA  Take 10 mg by mouth at bedtime.     fexofenadine 180 MG tablet  Commonly known as:  ALLEGRA  Take 180 mg by mouth daily as needed for allergies.     fluticasone 50 MCG/ACT nasal spray  Commonly known as:  FLONASE  Place 2 sprays into both nostrils daily as needed for allergies.     folic acid-pyridoxine-cyancobalamin 2.5-25-2 MG Tabs  Commonly known as:  FOLTX  Take 1 tablet  by mouth daily.     levothyroxine 200 MCG tablet  Commonly known as:  SYNTHROID, LEVOTHROID  Take 200 mcg by mouth daily before breakfast.     nitroGLYCERIN 0.4 MG SL tablet  Commonly known as:  NITROSTAT  Place 0.4 mg under the tongue every 5 (five) minutes as needed for chest pain.     omega-3 acid ethyl esters 1 G capsule  Commonly known as:  LOVAZA  Take 1 g by mouth daily.     pantoprazole 40 MG tablet  Commonly known as:  PROTONIX  Take 40 mg by mouth 2 (two) times daily as needed (gerd).     polyethylene glycol packet  Commonly known as:  MIRALAX / GLYCOLAX  Take 17 g by mouth daily as needed (for constipation).     raloxifene 60 MG tablet  Commonly known as:  EVISTA  Take 60 mg by mouth daily.     spironolactone 25 MG tablet  Commonly known as:  ALDACTONE  Take 25 mg by mouth daily.       Allergies  Allergen Reactions  . Coconut Oil Anaphylaxis and Hives  . Lansoprazole Nausea Only  . Penicillins Swelling    Throat swells  . Statins Other (See Comments)    cramps  . Lisinopril     Cough- but still tolerates it  . Lactose Intolerance (Gi) Diarrhea and Other (See Comments)    Patient reports abdominal cramping and diarrhea with lactose products.        Follow-up Information   Follow up with [name], MD. Schedule an appointment as soon as possible for a visit in 2 weeks.   Specialty:  Internal Medicine   Contact information:   [address], [address]LO[address] [place]  [address] [phone]        The results of significant diagnostics from this hospitalization (including imaging, microbiology, ancillary and laboratory) are listed below for reference.    Significant Diagnostic Studies: Dg Chest 2 View  04/22/2013   CLINICAL DATA:  Chest pain  EXAM: CHEST  2 VIEW  COMPARISON:  DG CHEST 1V PORT dated 04/10/2013  FINDINGS: The heart size and mediastinal contours are within normal limits. Both lungs are clear. The visualized skeletal  structures are unremarkable.  IMPRESSION: No active cardiopulmonary disease.   Electronically Signed   By: Kathreen Devoid   On: 04/22/2013 13:11   Dg Chest Portable 1 View  04/10/2013   CLINICAL DATA:  Irregular heartbeat. History of hypertension, atrial fibrillation and stent placement.  EXAM: PORTABLE CHEST - 1 VIEW  COMPARISON:  DG CHEST 1V PORT dated 03/11/2013; DG CHEST 2 VIEW dated 12/18/2012  FINDINGS: 1556 hr. The heart size and mediastinal contours are stable. There is mild aortic atherosclerosis.  The lungs are clear. There is no pleural effusion or pneumothorax. Patient is status post thoracic spinal augmentation. Multiple telemetry leads overlie the chest.  IMPRESSION: No active cardiopulmonary process.   Electronically Signed   By: Camie Patience M.D.   On: 04/10/2013 16:39    Microbiology: No results found for this or any previous visit (from the past 240 hour(s)).   Labs: Basic Metabolic Panel:  Recent Labs Lab 04/22/13 1200 04/22/13 2128  NA 135*  --   K 4.3  --   CL 92*  --   CO2 27  --   GLUCOSE 108*  --   BUN 18  --   CREATININE 1.35* 1.21*  CALCIUM 8.8  --   MG  --  1.9  PHOS  --  3.9   Liver Function Tests:  Recent Labs Lab 04/22/13 1714  AST 84*  ALT 37*  ALKPHOS 67  BILITOT 0.9  PROT 7.6  ALBUMIN 3.5   No results found for this basename: LIPASE, AMYLASE,  in the last 168 hours No results found for this basename: AMMONIA,  in the last 168 hours CBC:  Recent Labs Lab 04/22/13 1200 04/22/13 2128  WBC 3.9* 3.3*  NEUTROABS 1.6*  --   HGB 14.6 14.2  HCT 40.3 39.4  MCV 97.6 98.7  PLT 151 144*   Cardiac Enzymes:  Recent Labs Lab 04/22/13 1200 04/22/13 2128 04/23/13 0242 04/23/13 0835  TROPONINI <0.30 <0.30 <0.30 <0.30   BNP: BNP (last 3 results)  Recent Labs  03/11/13 2050 04/10/13 1557 04/22/13 1200  PROBNP 2015.0* 3238.0* 754.3*   CBG: No results found for this basename: GLUCAP,  in the last 168 hours     Signed:  Lelon Frohlich  [facility] Pager: [phone] 04/23/2013, 4:13 PM

## 2013-04-23 NOTE — Evaluation (Signed)
Physical Therapy Evaluation Patient Details Name: [name] MRN: [id number] DOB: 06-18-1950 Today's Date: 04/23/2013 Time: 0933-1000 PT Time Calculation (min): 27 min  PT Assessment / Plan / Recommendation History of Present Illness  adm with Chest pain and weakness PMHx-extensive including CAD, stents, CHF with EF 20%, spinal stenosis with Lt foot drop  Clinical Impression  Patient evaluated by Physical Therapy with no further acute PT needs identified. Pt concerned re: loss of strength/stamina from recent hospitalizations and illness. Recommend HHPT for safety eval. PT is signing off. Thank you for this referral.     PT Assessment  Patient needs continued PT services    Follow Up Recommendations  Home health PT    Does the patient have the potential to tolerate intense rehabilitation      Barriers to Discharge        Equipment Recommendations  None recommended by PT    Recommendations for Other Services     Frequency      Precautions / Restrictions Precautions Precautions: Fall Precaution Comments: reports she furniture walks inside her home; reports last fall several years ago due to Lt foot drop   Pertinent Vitals/Pain Chronic low back pain (not rated; relieved with repositioning) HR 64-72 SaO2 99% on RA       Mobility  Bed Mobility Overal bed mobility: Modified Independent Transfers Overall transfer level: Needs assistance Equipment used: Rolling walker (2 wheeled);None Transfers: Sit to/from Stand Sit to Stand: Supervision General transfer comment: vc for safe, proper use of RW (tends to park it off to the side) Ambulation/Gait Ambulation/Gait assistance: Supervision Ambulation Distance (Feet): 75 Feet Assistive device: Rolling walker (2 wheeled) Gait Pattern/deviations: Step-through pattern;Trunk flexed General Gait Details: prefers RW a bit too tall for her (and then pushes it too far ahead of herself); steady with no drifting or stagger     Exercises General Exercises - Lower Extremity Ankle Circles/Pumps: AROM;Right;5 reps Quad Sets: AROM;Both Heel Slides: AROM;Both   PT Diagnosis: Generalized weakness  PT Problem List: Decreased strength;Decreased activity tolerance;Decreased mobility;Decreased balance;Decreased knowledge of use of DME;Decreased safety awareness PT Treatment Interventions:       PT Goals(Current goals can be found in the care plan section) Acute Rehab PT Goals PT Goal Formulation: No goals set, d/c therapy  Visit Information  Last PT Received On: 04/23/13 Assistance Needed: +1 History of Present Illness: adm with Chest pain and weakness PMHx-extensive including CAD, stents, CHF with EF 20%, spinal stenosis with Lt foot drop       Prior [place] expects to be discharged to:: Private residence Living Arrangements: Alone Available Help at Discharge: Neighbor;Friend(s);Available PRN/intermittently (brother (in town) in rehab center) Type of Home: House Home Access: Stairs to enter Technical brewer of Steps: 3 Entrance Stairs-Rails: Left Home Layout: One level Home Equipment: [place] - single point;Walker - 2 wheels;Grab bars - toilet;Grab bars - tub/shower (walk-in shower, handicap toilet) Prior Function Level of Independence: Independent with assistive device(s) Comments: drives; grocery shops with electric cart; has a housekeeper;  Communication Communication: No difficulties    Cognition  Cognition Arousal/Alertness: Awake/alert Behavior During Therapy: WFL for tasks assessed/performed Overall Cognitive Status: Within Functional Limits for tasks assessed    Extremity/Trunk Assessment Upper Extremity Assessment Upper Extremity Assessment: Generalized weakness Lower Extremity Assessment Lower Extremity Assessment: Generalized weakness;LLE deficits/detail LLE Deficits / Details: Lt foot drop due to spinal stenosis Cervical / Trunk Assessment Cervical /  Trunk Assessment: Normal   Balance Balance Overall balance assessment: Needs assistance  Sitting-balance support: No upper extremity supported;Feet unsupported Sitting balance-Leahy Scale: Good Standing balance support: No upper extremity supported Standing balance-Leahy Scale: Fair General Comments General comments (skin integrity, edema, etc.): Pt concerned over her lack of strength and stamina due to recent hospitalizations and heart problems. Eager to resume HHPT (has used previously).  End of Session PT - End of Session Activity Tolerance: Patient limited by fatigue Patient left: in bed;with call bell/phone within reach;with nursing/sitter in room Nurse Communication: Mobility status;Other (comment) (need HHPT)  GP Functional Assessment Tool Used: clinical judgement Functional Limitation: Mobility: Walking and moving around Mobility: Walking and Moving Around Current Status [phone]): At least 1 percent but less than 20 percent impaired, limited or restricted Mobility: Walking and Moving Around Goal Status [phone]): At least 1 percent but less than 20 percent impaired, limited or restricted Mobility: Walking and Moving Around Discharge Status [phone]): At least 1 percent but less than 20 percent impaired, limited or restricted   Rikki Smestad 04/23/2013, 10:12 AM Pager [phone]

## 2013-04-23 NOTE — Care Management Note (Signed)
    Page 1 of 1   04/23/2013     12:05:39 PM   CARE MANAGEMENT NOTE 04/23/2013  Patient:  [name],[name]   Account Number:  [id number]  Date Initiated:  04/23/2013  Documentation initiated by:  GRAVES-BIGELOW,Ladawna Walgren  Subjective/Objective Assessment:   Pt admitted with cp. Plan for d/c today.     Action/Plan:   Pt agreeable to [facility] services with [place]. CM did make referral and SOC to begin within 24-48 hrs post d/c.   Anticipated DC Date:  04/23/2013   Anticipated DC Plan:  [place]  CM consult      [facility] Choice  HOME HEALTH   Choice offered to / List presented to:  C-1 Patient        [place] arranged  HH-2 PT      [place]   Status of service:  Completed, signed off Medicare Important Message given?   (If response is "NO", the following Medicare IM given date fields will be blank) Date Medicare IM given:   Date Additional Medicare IM given:    Discharge Disposition:  [place]  Per UR Regulation:  Reviewed for med. necessity/level of care/duration of stay  If discussed at [place] of Stay Meetings, dates discussed:    Comments:

## 2013-04-25 ENCOUNTER — Encounter: Payer: Self-pay | Admitting: Cardiology

## 2013-04-25 ENCOUNTER — Ambulatory Visit (HOSPITAL_COMMUNITY): Payer: Medicare Other | Attending: Cardiology | Admitting: Radiology

## 2013-04-25 VITALS — BP 146/90 | HR 56 | Ht 66.0 in | Wt 142.0 lb

## 2013-04-25 DIAGNOSIS — R079 Chest pain, unspecified: Secondary | ICD-10-CM

## 2013-04-25 DIAGNOSIS — Z01810 Encounter for preprocedural cardiovascular examination: Secondary | ICD-10-CM | POA: Insufficient documentation

## 2013-04-25 DIAGNOSIS — Z95810 Presence of automatic (implantable) cardiac defibrillator: Secondary | ICD-10-CM | POA: Insufficient documentation

## 2013-04-25 DIAGNOSIS — I4891 Unspecified atrial fibrillation: Secondary | ICD-10-CM | POA: Insufficient documentation

## 2013-04-25 DIAGNOSIS — I10 Essential (primary) hypertension: Secondary | ICD-10-CM | POA: Insufficient documentation

## 2013-04-25 DIAGNOSIS — Z8673 Personal history of transient ischemic attack (TIA), and cerebral infarction without residual deficits: Secondary | ICD-10-CM | POA: Insufficient documentation

## 2013-04-25 DIAGNOSIS — I252 Old myocardial infarction: Secondary | ICD-10-CM | POA: Insufficient documentation

## 2013-04-25 DIAGNOSIS — E785 Hyperlipidemia, unspecified: Secondary | ICD-10-CM | POA: Insufficient documentation

## 2013-04-25 DIAGNOSIS — Z87891 Personal history of nicotine dependence: Secondary | ICD-10-CM | POA: Insufficient documentation

## 2013-04-25 DIAGNOSIS — I2510 Atherosclerotic heart disease of native coronary artery without angina pectoris: Secondary | ICD-10-CM | POA: Insufficient documentation

## 2013-04-25 MED ORDER — TECHNETIUM TC 99M SESTAMIBI GENERIC - CARDIOLITE
33.0000 | Freq: Once | INTRAVENOUS | Status: AC | PRN
  Administered 2013-04-25: 33 via INTRAVENOUS

## 2013-04-25 MED ORDER — TECHNETIUM TC 99M SESTAMIBI GENERIC - CARDIOLITE
11.0000 | Freq: Once | INTRAVENOUS | Status: AC | PRN
  Administered 2013-04-25: 11 via INTRAVENOUS

## 2013-04-25 MED ORDER — REGADENOSON 0.4 MG/5ML IV SOLN
0.4000 mg | Freq: Once | INTRAVENOUS | Status: AC
  Administered 2013-04-25: 0.4 mg via INTRAVENOUS

## 2013-04-25 NOTE — Progress Notes (Signed)
[place] [facility] N[facility] [address] [place], [place] [address] [phone]    Cardiology Nuclear Med Study  [name] is a 63 y.o. female     MRN : [id number]     DOB: [birth year]  Procedure Date: 04/25/2013  Nuclear Med Background Indication for Stress Test:  Evaluation for Ischemia, Surgical Clearance- possible ablation- Dr. Rayann Heman and [facility] 2/15 A-fib, CP History:  CAD, MI, Cath 2012 (stents), AICD, Afib, Echo 2015 EF 20-25% Cardiac Risk Factors: History of Smoking, Hypertension, Lipids and TIA  Symptoms:  Chest Pain   Nuclear Pre-Procedure Caffeine/Decaff Intake:  None NPO After: 9:00pm   Lungs:  clear O2 Sat: 99% on room air. IV 0.9% NS with Angio Cath:  24g  IV Site: R Hand  IV Started by:  Annye Rusk, CNMT  Chest Size (in):  36 Cup Size: D  Height: 5\' 6"  (1.676 m)  Weight:  142 lb (64.411 kg)  BMI:  Body mass index is 22.93 kg/(m^2). Tech Comments:  N/A    Nuclear Med Study 1 or 2 day study: 1 day  Stress Test Type:  Lexiscan  Reading MD: N/A  Order Authorizing Provider:  Daneen Schick, MD  Resting Radionuclide: Technetium 39m Sestamibi  Resting Radionuclide Dose: 11.0 mCi   Stress Radionuclide:  Technetium 81m Sestamibi  Stress Radionuclide Dose: 33.0 mCi           Stress Protocol Rest HR: 56 Stress HR: 82  Rest BP: 146/90 Stress BP: 80/53  Exercise Time (min): n/a METS: n/a           Dose of Adenosine (mg):  n/a Dose of Lexiscan: 0.4 mg  Dose of Atropine (mg): n/a Dose of Dobutamine: n/a mcg/kg/min (at max HR)  Stress Test Technologist: Glade Lloyd, BS-ES  Nuclear Technologist:  Annye Rusk, CNMT     Rest Procedure:  Myocardial perfusion imaging was performed at rest 45 minutes following the intravenous administration of Technetium 29m Sestamibi. Rest ECG: Sinus brady (56bpm) with inferior Q waves and poor R wave progression.   Stress Procedure:  The patient received IV Lexiscan 0.4 mg over 15-seconds.  Technetium 61m  Sestamibi injected at 30-seconds.  Quantitative spect images were obtained after a 45 minute delay.  During the infusion of Lexiscan, the patient complained of SOB, chest pressure, lightheadedness and stomach upset.  These symptoms slowly began to resolve in recovery.  Stress ECG: No significant change from baseline ECG  QPS Raw Data Images:  Normal; no motion artifact; normal heart/lung ratio. Stress Images:  There is a large, severe defect involving the apex, mid to distal anterior/inferior/anteroseptal/inferoseptal/lateral wall consistent with infarct.  Rest Images:  There is a large, severe defect involving the apex, mid to distal anterior/inferior/anteroseptal/inferoseptal/lateral wall consistent with infarct. Subtraction (SDS):  No evidence of ischemia. Transient Ischemic Dilatation (Normal <1.22):  .94 Lung/Heart Ratio (Normal <0.45):  .43   Quantitative Gated Spect Images QGS EDV:  209 ml QGS ESV:  152 ml  Impression Exercise Capacity:  Lexiscan with no exercise. BP Response:  Hypotensive blood pressure response. Clinical Symptoms:  Typical symptoms with Lexiscan. ECG Impression:  No significant ST segment change suggestive of ischemia. Comparison with Prior Nuclear Study: No previous nuclear study performed  Overall Impression:  High risk stress nuclear study with large infarct pattern sparing the basal portions of the myocardium. No ischemia identified. .  LV Ejection Fraction: 28%.  LV Wall Motion:  Severely reduced EF with akinesis of the apical, mid  to distal myocardial segments, sparing the basilar myocardium.   Candee Furbish, MD

## 2013-04-29 ENCOUNTER — Ambulatory Visit: Payer: Medicare Other | Admitting: Internal Medicine

## 2013-04-30 ENCOUNTER — Telehealth: Payer: Self-pay

## 2013-04-30 NOTE — Telephone Encounter (Signed)
Message copied by Lamar Laundry on Tue Apr 30, 2013  4:51 PM ------      Message from: Daneen Schick      Created: Fri Apr 26, 2013  1:16 PM       Markedly abnormal but no reason to due further w/u such as cath. ------

## 2013-04-30 NOTE — Telephone Encounter (Signed)
Markedly abnormal but no reason to due further w/u such as cath. pt given nuclear studyb results.Markedly abnormal but no reason to due further w/u such as cath.pt would like to know when she needs to f/u with Dr.Smith adv pt I would take with Dr.Smith and call her back on Thurs pt verbalized understanding.

## 2013-05-01 NOTE — Telephone Encounter (Signed)
Whenever EP has completed there treatment.

## 2013-05-02 ENCOUNTER — Telehealth: Payer: Self-pay | Admitting: Internal Medicine

## 2013-05-02 ENCOUNTER — Ambulatory Visit (INDEPENDENT_AMBULATORY_CARE_PROVIDER_SITE_OTHER): Payer: Medicare Other | Admitting: Internal Medicine

## 2013-05-02 ENCOUNTER — Encounter: Payer: Self-pay | Admitting: Internal Medicine

## 2013-05-02 VITALS — BP 120/72 | HR 76 | Temp 98.5°F | Resp 16 | Ht 66.0 in | Wt 145.0 lb

## 2013-05-02 DIAGNOSIS — I5022 Chronic systolic (congestive) heart failure: Secondary | ICD-10-CM

## 2013-05-02 DIAGNOSIS — E039 Hypothyroidism, unspecified: Secondary | ICD-10-CM

## 2013-05-02 DIAGNOSIS — I2510 Atherosclerotic heart disease of native coronary artery without angina pectoris: Secondary | ICD-10-CM

## 2013-05-02 DIAGNOSIS — K219 Gastro-esophageal reflux disease without esophagitis: Secondary | ICD-10-CM

## 2013-05-02 MED ORDER — HYOSCYAMINE SULFATE ER 0.375 MG PO TB12: 0.3750 mg | ORAL_TABLET | Freq: Two times a day (BID) | ORAL | Status: DC

## 2013-05-02 NOTE — Telephone Encounter (Signed)
New message    Pt was referred to [facility] by PCP but she has many heart issues and is scheduled for an ablation.  Nurse want to make sure it is OK by cardiologist to see pt

## 2013-05-02 NOTE — Progress Notes (Signed)
Pre visit review using our clinic review tool, if applicable. No additional management support is needed unless otherwise documented below in the visit note. 

## 2013-05-02 NOTE — Assessment & Plan Note (Signed)
Her recent TSH was normal 

## 2013-05-02 NOTE — Patient Instructions (Signed)
Gastroesophageal Reflux Disease, Adult  Gastroesophageal reflux disease (GERD) happens when acid from your stomach flows up into the esophagus. When acid comes in contact with the esophagus, the acid causes soreness (inflammation) in the esophagus. Over time, GERD may create small holes (ulcers) in the lining of the esophagus.  CAUSES   · Increased body weight. This puts pressure on the stomach, making acid rise from the stomach into the esophagus.  · Smoking. This increases acid production in the stomach.  · Drinking alcohol. This causes decreased pressure in the lower esophageal sphincter (valve or ring of muscle between the esophagus and stomach), allowing acid from the stomach into the esophagus.  · Late evening meals and a full stomach. This increases pressure and acid production in the stomach.  · A malformed lower esophageal sphincter.  Sometimes, no cause is found.  SYMPTOMS   · Burning pain in the lower part of the mid-chest behind the breastbone and in the mid-stomach area. This may occur twice a week or more often.  · Trouble swallowing.  · Sore throat.  · Dry cough.  · Asthma-like symptoms including chest tightness, shortness of breath, or wheezing.  DIAGNOSIS   Your caregiver may be able to diagnose GERD based on your symptoms. In some cases, X-rays and other tests may be done to check for complications or to check the condition of your stomach and esophagus.  TREATMENT   Your caregiver may recommend over-the-counter or prescription medicines to help decrease acid production. Ask your caregiver before starting or adding any new medicines.   HOME CARE INSTRUCTIONS   · Change the factors that you can control. Ask your caregiver for guidance concerning weight loss, quitting smoking, and alcohol consumption.  · Avoid foods and drinks that make your symptoms worse, such as:  · Caffeine or alcoholic drinks.  · Chocolate.  · Peppermint or mint flavorings.  · Garlic and onions.  · Spicy foods.  · Citrus fruits,  such as oranges, lemons, or limes.  · Tomato-based foods such as sauce, chili, salsa, and pizza.  · Fried and fatty foods.  · Avoid lying down for the 3 hours prior to your bedtime or prior to taking a nap.  · Eat small, frequent meals instead of large meals.  · Wear loose-fitting clothing. Do not wear anything tight around your waist that causes pressure on your stomach.  · Raise the head of your bed 6 to 8 inches with wood blocks to help you sleep. Extra pillows will not help.  · Only take over-the-counter or prescription medicines for pain, discomfort, or fever as directed by your caregiver.  · Do not take aspirin, ibuprofen, or other nonsteroidal anti-inflammatory drugs (NSAIDs).  SEEK IMMEDIATE MEDICAL CARE IF:   · You have pain in your arms, neck, jaw, teeth, or back.  · Your pain increases or changes in intensity or duration.  · You develop nausea, vomiting, or sweating (diaphoresis).  · You develop shortness of breath, or you faint.  · Your vomit is green, yellow, black, or looks like coffee grounds or blood.  · Your stool is red, bloody, or black.  These symptoms could be signs of other problems, such as heart disease, gastric bleeding, or esophageal bleeding.  MAKE SURE YOU:   · Understand these instructions.  · Will watch your condition.  · Will get help right away if you are not doing well or get worse.  Document Released: 12/15/2004 Document Revised: 05/30/2011 Document Reviewed: 09/24/2010  [employer]® Patient   Information ©2014 [employer].

## 2013-05-02 NOTE — Progress Notes (Signed)
Subjective:    Patient ID: [name], female    DOB: Jan 15, 1951, 63 y.o.   MRN: [id number]  Gastrophageal Reflux She complains of belching, globus sensation and heartburn. She reports no abdominal pain, no chest pain, no choking, no coughing, no dysphagia, no early satiety, no hoarse voice, no nausea, no sore throat, no stridor, no tooth decay, no water brash or no wheezing. This is a chronic problem. The current episode started more than 1 year ago. The problem has been gradually worsening. The heartburn duration is several minutes. The heartburn is located in the substernum. The heartburn is of severe intensity. The heartburn wakes her from sleep. The heartburn limits her activity. The heartburn changes with position. Nothing aggravates the symptoms. Associated symptoms include fatigue. Pertinent negatives include no anemia, melena, muscle weakness, orthopnea or weight loss. She has tried a PPI for the symptoms. The treatment provided mild relief.      Review of Systems  Constitutional: Positive for fatigue. Negative for fever, chills, weight loss, diaphoresis, activity change, appetite change and unexpected weight change.  HENT: Negative.  Negative for hoarse voice and sore throat.   Eyes: Negative.   Respiratory: Positive for shortness of breath. Negative for apnea, cough, choking, chest tightness, wheezing and stridor.   Cardiovascular: Negative.  Negative for chest pain, palpitations and leg swelling.  Gastrointestinal: Positive for heartburn. Negative for dysphagia, nausea, vomiting, abdominal pain, diarrhea, constipation, blood in stool and melena.  Endocrine: Negative.   Genitourinary: Negative.   Musculoskeletal: Positive for arthralgias. Negative for back pain, gait problem, joint swelling, muscle weakness, myalgias, neck pain and neck stiffness.  Skin: Negative.   Allergic/Immunologic: Negative.   Neurological: Negative.   Hematological: Negative.  Negative for adenopathy.  Does not bruise/bleed easily.  Psychiatric/Behavioral: Negative.        Objective:   Physical Exam  Vitals reviewed. Constitutional: She is oriented to person, place, and time. She appears well-developed and well-nourished. No distress.  HENT:  Head: Normocephalic and atraumatic.  Mouth/Throat: Oropharynx is clear and moist. No oropharyngeal exudate.  Eyes: Conjunctivae are normal. Right eye exhibits no discharge. No scleral icterus.  Neck: Normal range of motion. Neck supple. No JVD present. No tracheal deviation present. No thyromegaly present.  Cardiovascular: Normal rate, regular rhythm, normal heart sounds and intact distal pulses.  Exam reveals no gallop and no friction rub.   No murmur heard. Pulmonary/Chest: Effort normal and breath sounds normal. No stridor. No respiratory distress. She has no wheezes. She has no rales. She exhibits no tenderness.  Abdominal: Soft. Bowel sounds are normal. She exhibits no distension and no mass. There is no tenderness. There is no rebound and no guarding.  Musculoskeletal: Normal range of motion. She exhibits no edema and no tenderness.  Lymphadenopathy:    She has no cervical adenopathy.  Neurological: She is oriented to person, place, and time.  Skin: Skin is warm and dry. No rash noted. She is not diaphoretic. No erythema. No pallor.     Lab Results  Component Value Date   WBC 3.3* 04/22/2013   HGB 14.2 04/22/2013   HCT 39.4 04/22/2013   PLT 144* 04/22/2013   GLUCOSE 108* 04/22/2013   CHOL 226* 04/22/2013   TRIG 68 04/22/2013   HDL 119 04/22/2013   LDLDIRECT 126.3 12/15/2011   LDLCALC 93 04/22/2013   ALT 37* 04/22/2013   AST 84* 04/22/2013   NA 135* 04/22/2013   K 4.3 04/22/2013   CL 92* 04/22/2013   CREATININE  1.21* 04/22/2013   BUN 18 04/22/2013   CO2 27 04/22/2013   TSH 1.356 04/22/2013   INR 1.14 04/10/2013   HGBA1C 5.7* 06/06/2012       Assessment & Plan:

## 2013-05-02 NOTE — Assessment & Plan Note (Signed)
This is stable Her recent episode of CP was not cardiac

## 2013-05-02 NOTE — Assessment & Plan Note (Signed)
[name] had an episode of CP last week and was seen in the hospital and [name] found that a GI cocktail relieved the pain [name] sees Dr. Michail Sermon soon about this - may need upper endoscopy For now I have asked her to be more complaint with the PPI therapy and to try levbid as needed for the discomfort

## 2013-05-08 NOTE — Telephone Encounter (Signed)
[email or web address] can see Dr.Smith whenEP has completed there treatment.

## 2013-05-20 DIAGNOSIS — N183 Chronic kidney disease, stage 3 unspecified: Secondary | ICD-10-CM

## 2013-05-20 DIAGNOSIS — M6281 Muscle weakness (generalized): Secondary | ICD-10-CM

## 2013-05-20 DIAGNOSIS — I129 Hypertensive chronic kidney disease with stage 1 through stage 4 chronic kidney disease, or unspecified chronic kidney disease: Secondary | ICD-10-CM

## 2013-05-20 DIAGNOSIS — M4800 Spinal stenosis, site unspecified: Secondary | ICD-10-CM

## 2013-05-20 DIAGNOSIS — I5022 Chronic systolic (congestive) heart failure: Secondary | ICD-10-CM

## 2013-05-20 DIAGNOSIS — I4891 Unspecified atrial fibrillation: Secondary | ICD-10-CM

## 2013-05-21 ENCOUNTER — Encounter (HOSPITAL_COMMUNITY): Payer: Self-pay | Admitting: Emergency Medicine

## 2013-05-21 ENCOUNTER — Telehealth: Payer: Self-pay | Admitting: Interventional Cardiology

## 2013-05-21 ENCOUNTER — Emergency Department (HOSPITAL_COMMUNITY): Payer: Medicare Other

## 2013-05-21 ENCOUNTER — Inpatient Hospital Stay (HOSPITAL_COMMUNITY)
Admission: EM | Admit: 2013-05-21 | Discharge: 2013-05-23 | DRG: 309 | Disposition: A | Discharge status: Home / Self Care | Payer: Medicare Other | Attending: Cardiology | Admitting: Cardiology

## 2013-05-21 DIAGNOSIS — M1990 Unspecified osteoarthritis, unspecified site: Secondary | ICD-10-CM

## 2013-05-21 DIAGNOSIS — I6529 Occlusion and stenosis of unspecified carotid artery: Secondary | ICD-10-CM

## 2013-05-21 DIAGNOSIS — I4891 Unspecified atrial fibrillation: Secondary | ICD-10-CM

## 2013-05-21 DIAGNOSIS — M545 Low back pain, unspecified: Secondary | ICD-10-CM | POA: Diagnosis present

## 2013-05-21 DIAGNOSIS — I471 Supraventricular tachycardia: Secondary | ICD-10-CM

## 2013-05-21 DIAGNOSIS — I255 Ischemic cardiomyopathy: Secondary | ICD-10-CM

## 2013-05-21 DIAGNOSIS — N183 Chronic kidney disease, stage 3 unspecified: Secondary | ICD-10-CM | POA: Diagnosis present

## 2013-05-21 DIAGNOSIS — Z7901 Long term (current) use of anticoagulants: Secondary | ICD-10-CM

## 2013-05-21 DIAGNOSIS — I4892 Unspecified atrial flutter: Secondary | ICD-10-CM

## 2013-05-21 DIAGNOSIS — I129 Hypertensive chronic kidney disease with stage 1 through stage 4 chronic kidney disease, or unspecified chronic kidney disease: Secondary | ICD-10-CM | POA: Diagnosis present

## 2013-05-21 DIAGNOSIS — Z95810 Presence of automatic (implantable) cardiac defibrillator: Secondary | ICD-10-CM

## 2013-05-21 DIAGNOSIS — K7689 Other specified diseases of liver: Secondary | ICD-10-CM | POA: Diagnosis present

## 2013-05-21 DIAGNOSIS — I359 Nonrheumatic aortic valve disorder, unspecified: Secondary | ICD-10-CM | POA: Diagnosis present

## 2013-05-21 DIAGNOSIS — E785 Hyperlipidemia, unspecified: Secondary | ICD-10-CM

## 2013-05-21 DIAGNOSIS — S32029A Unspecified fracture of second lumbar vertebra, initial encounter for closed fracture: Secondary | ICD-10-CM

## 2013-05-21 DIAGNOSIS — Z96659 Presence of unspecified artificial knee joint: Secondary | ICD-10-CM

## 2013-05-21 DIAGNOSIS — M4800 Spinal stenosis, site unspecified: Secondary | ICD-10-CM

## 2013-05-21 DIAGNOSIS — I5022 Chronic systolic (congestive) heart failure: Secondary | ICD-10-CM

## 2013-05-21 DIAGNOSIS — I509 Heart failure, unspecified: Secondary | ICD-10-CM | POA: Diagnosis present

## 2013-05-21 DIAGNOSIS — I10 Essential (primary) hypertension: Secondary | ICD-10-CM

## 2013-05-21 DIAGNOSIS — I2510 Atherosclerotic heart disease of native coronary artery without angina pectoris: Secondary | ICD-10-CM | POA: Diagnosis present

## 2013-05-21 DIAGNOSIS — I639 Cerebral infarction, unspecified: Secondary | ICD-10-CM

## 2013-05-21 DIAGNOSIS — Z8673 Personal history of transient ischemic attack (TIA), and cerebral infarction without residual deficits: Secondary | ICD-10-CM

## 2013-05-21 DIAGNOSIS — I480 Paroxysmal atrial fibrillation: Secondary | ICD-10-CM

## 2013-05-21 DIAGNOSIS — N189 Chronic kidney disease, unspecified: Secondary | ICD-10-CM

## 2013-05-21 DIAGNOSIS — K219 Gastro-esophageal reflux disease without esophagitis: Secondary | ICD-10-CM | POA: Diagnosis present

## 2013-05-21 DIAGNOSIS — I252 Old myocardial infarction: Secondary | ICD-10-CM

## 2013-05-21 DIAGNOSIS — E039 Hypothyroidism, unspecified: Secondary | ICD-10-CM | POA: Diagnosis present

## 2013-05-21 DIAGNOSIS — G8929 Other chronic pain: Secondary | ICD-10-CM | POA: Diagnosis present

## 2013-05-21 DIAGNOSIS — I059 Rheumatic mitral valve disease, unspecified: Secondary | ICD-10-CM | POA: Diagnosis present

## 2013-05-21 DIAGNOSIS — I2589 Other forms of chronic ischemic heart disease: Secondary | ICD-10-CM | POA: Diagnosis present

## 2013-05-21 DIAGNOSIS — I498 Other specified cardiac arrhythmias: Principal | ICD-10-CM | POA: Diagnosis present

## 2013-05-21 LAB — COMPREHENSIVE METABOLIC PANEL
ALBUMIN: 3.9 g/dL (ref 3.5–5.2)
ALK PHOS: 63 U/L (ref 39–117)
ALT: 33 U/L (ref 0–35)
AST: 75 U/L — ABNORMAL HIGH (ref 0–37)
BUN: 16 mg/dL (ref 6–23)
CHLORIDE: 98 meq/L (ref 96–112)
CO2: 23 meq/L (ref 19–32)
Calcium: 9.2 mg/dL (ref 8.4–10.5)
Creatinine, Ser: 1.13 mg/dL — ABNORMAL HIGH (ref 0.50–1.10)
GFR, EST AFRICAN AMERICAN: 59 mL/min — AB (ref >90)
GFR, EST NON AFRICAN AMERICAN: 51 mL/min — AB (ref >90)
GLUCOSE: 129 mg/dL — AB (ref 70–99)
POTASSIUM: 4.9 meq/L (ref 3.7–5.3)
Sodium: 138 mEq/L (ref 137–147)
Total Bilirubin: 0.8 mg/dL (ref 0.3–1.2)
Total Protein: 7.9 g/dL (ref 6.0–8.3)

## 2013-05-21 LAB — CBC WITH DIFFERENTIAL/PLATELET
BASOS PCT: 1 % (ref 0–1)
Basophils Absolute: 0 10*3/uL (ref 0.0–0.1)
Eosinophils Absolute: 0 10*3/uL (ref 0.0–0.7)
Eosinophils Relative: 1 % (ref 0–5)
HCT: 42.3 % (ref 36.0–46.0)
HEMOGLOBIN: 15.2 g/dL — AB (ref 12.0–15.0)
LYMPHS ABS: 1.6 10*3/uL (ref 0.7–4.0)
Lymphocytes Relative: 41 % (ref 12–46)
MCH: 35.7 pg — AB (ref 26.0–34.0)
MCHC: 35.9 g/dL (ref 30.0–36.0)
MCV: 99.3 fL (ref 78.0–100.0)
MONO ABS: 0.8 10*3/uL (ref 0.1–1.0)
MONOS PCT: 22 % — AB (ref 3–12)
NEUTROS ABS: 1.4 10*3/uL — AB (ref 1.7–7.7)
NEUTROS PCT: 35 % — AB (ref 43–77)
Platelets: 166 10*3/uL (ref 150–400)
RBC: 4.26 MIL/uL (ref 3.87–5.11)
RDW: 15.8 % — ABNORMAL HIGH (ref 11.5–15.5)
WBC: 3.8 10*3/uL — ABNORMAL LOW (ref 4.0–10.5)

## 2013-05-21 LAB — TROPONIN I: Troponin I: <0.3 ng/mL (ref <0.30)

## 2013-05-21 MED ORDER — AMIODARONE HCL 200 MG PO TABS: ORAL_TABLET | ORAL | Status: DC

## 2013-05-21 MED ORDER — DILTIAZEM HCL 25 MG/5ML IV SOLN
20.0000 mg | Freq: Once | INTRAVENOUS | Status: AC
  Administered 2013-05-21: 20 mg via INTRAVENOUS

## 2013-05-21 MED ORDER — DILTIAZEM HCL 100 MG IV SOLR
5.0000 mg/h | Freq: Once | INTRAVENOUS | Status: AC
  Administered 2013-05-22: 5 mg/h via INTRAVENOUS

## 2013-05-21 MED ORDER — DILTIAZEM HCL 60 MG PO TABS
60.0000 mg | ORAL_TABLET | Freq: Once | ORAL | Status: AC
  Administered 2013-05-21: 60 mg via ORAL
  Filled 2013-05-21: qty 1

## 2013-05-21 MED ORDER — AMIODARONE HCL 200 MG PO TABS
200.0000 mg | ORAL_TABLET | Freq: Once | ORAL | Status: AC
  Administered 2013-05-21: 200 mg via ORAL
  Filled 2013-05-21: qty 1

## 2013-05-21 MED ORDER — DEXTROSE 5 % IV SOLN
5.0000 mg/h | Freq: Once | INTRAVENOUS | Status: AC
  Administered 2013-05-21: 5 mg/h via INTRAVENOUS

## 2013-05-21 NOTE — ED Notes (Signed)
Spoke with EDP regarding pt and her HR.  Pt still stating that she does not "feel right".  Reporting nausea.

## 2013-05-21 NOTE — ED Provider Notes (Addendum)
CSN: [id number]     Arrival date & time 05/21/13  1305 History   First MD Initiated Contact with Patient 05/21/13 1319     Chief Complaint  Patient presents with  . Palpitations  . Chest Pain     (Consider location/radiation/quality/duration/timing/severity/associated sxs/prior Treatment) Patient is a 63 y.o. female presenting with palpitations and chest pain. The history is provided by the patient.  Palpitations Associated symptoms: chest pain and shortness of breath   Associated symptoms: no back pain, no cough and no vomiting   Chest Pain Associated symptoms: palpitations and shortness of breath   Associated symptoms: no abdominal pain, no back pain, no cough, no fever, no headache and not vomiting   pt w hx cad, afib, presents c/o chest pain and palpitations in the past couple days. States palpitations was intermittent but today constant, racing. Chest pain dull, mid chest, without specific exacerbating or alleviating factors. Sob. No diaphoresis or nv. States similar symptoms w afib in past. Is on eliquis. States compliant w normal meds, however hasnt taken any of her meds yet today. No syncope or faintness. Todays, symptoms persistent/constant, and worse than prior.      Past Medical History  Diagnosis Date  . Spinal stenosis   . Numbness of foot     left foot  . Diverticulitis     a. s/p partial colectomy 1/12 with reversal in July 2012.  . Colitis, ischemic   . DJD (degenerative joint disease)     History of multiple surgeries to the back, shoulder and knee  . Ischemic cardiomyopathy     a. Echo 06/16/10: EF 25-30%, anteroseptal and apical hypokinesis, moderate AI, mild MR.  . Chronic systolic heart failure   . CAD (coronary artery disease)     a.  Ant MI 8/03 with stenting of the LAD;  b. staged PCI with Cypher DES to OM1 8/03;  c. s/p Cypher DES to LAD 7/04;  d.  multiple cardiac caths in past (chronically abnl ECG);    e. cardiac catheterization 3/12: LAD stent patent,  distal LAD 40-50%, small ostial D1 80%, ostial D2 60%, OM-1 stent patent (20-30%), proximal RCA 50%, proximal to mid RCA 40-50%; f. cath 02/17/2011 - nonobs  . LV (left ventricular) mural thrombus   . CKD (chronic kidney disease), stage III     creatinine:  1.3 in 4/12;    . Tobacco abuse     history  . GERD (gastroesophageal reflux disease)   . Hypertension   . Lower GI bleed     August 2011  . Hypothyroidism   . Aortic insufficiency   . Pseudoaneurysm     History of, right forearm  . Hyperlipidemia   . Compression fracture 07/04/11    L2  . 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 AICD (automatic cardioverter/defibrillator) present     Implanted for primary prevention. Device revision 2006. Repeat operation at [facility] wtih chronic smoldering infection 11/2012 s/p device extraction and subsequent LifeVest.  . PAF (paroxysmal atrial fibrillation)   . Anatomical narrow angle of right eye   . Blood transfusion     "must have been when I had the total knee replacement" (04/10/2013)  . Anemia   . H/O hiatal hernia   . Sinus bradycardia 03/05/2012  . Paroxysmal atrial flutter   . Mitral regurgitation   . Heart murmur   . CHF (congestive heart  failure)     "3-4 times" (04/10/2013)  . Myocardial infarction 2002  . Stroke     History of TIA and possible CVA; hospitalized 2004; on coumadin  . Stroke     "I've had 3"; denies residual on 04/10/2013  . Chronic lower back pain    Past Surgical History  Procedure Laterality Date  . Back surgery    . Cardiac defibrillator placement  07/2003; 10/2004  . Lumbar laminectomy/decompression microdiscectomy  10/2001    L5-S1/E-chart  . Knee arthroscopy Right   . Thyroidectomy  1970's  . Total knee arthroplasty Left 11/2003  . X-stop implantation  12/2004    L3-4; L4-5  . Fixation kyphoplasty thoracic spine  07/2007    T3, 4, 6 compression  fractures  . Shoulder open rotator cuff repair Right 04/2008  . Lumbar laminectomy/decompression microdiscectomy  01/2010  . Colectomy  03/2010    sigmoid left  . Colostomy  03/2010    transverse  . Peripherally inserted central catheter insertion  03/2010    removed upon discharge  . Colostomy closure  12/2010    reversal  . Tonsillectomy and adenoidectomy  1969  . Appendectomy  03/2010  . Glaucoma surgery Right   . Biv icd genertaor change out  12/17/2012    "right now I don't have a defibrillator; I've been using an external one" (04/10/2013)  . Icd lead removal Left 12/17/2012    Procedure: ICD LEAD REMOVAL;  Surgeon: Evans Lance, MD;  Location: [facility];  Service: Cardiovascular;  Laterality: Left;  . Coronary angioplasty with stent placement  10/2001; 09/2002; ?date    "I've got a total of 4" (04/10/2013)   Family History  Problem Relation Age of Onset  . Diabetes Mother   . Diabetes Brother   . Arthritis      family history  . Prostate cancer      Family History   History  Substance Use Topics  . Smoking status: Former Smoker -- 0.10 packs/day for 33 years    Types: Cigarettes    Quit date: 04/21/2001  . Smokeless tobacco: Never Used     Comment: "smoked ~ 1 pack/month"  . Alcohol Use: No     Comment: 07/05/11 "occasionally; last time was glass of wine last week"   OB History   Grav Para Term Preterm Abortions TAB SAB Ect Mult Living                 Review of Systems  Constitutional: Negative for fever and chills.  HENT: Negative for sore throat.   Eyes: Negative for redness.  Respiratory: Positive for shortness of breath. Negative for cough.   Cardiovascular: Positive for chest pain and palpitations. Negative for leg swelling.  Gastrointestinal: Negative for vomiting and abdominal pain.  Endocrine: Negative for polyuria.  Genitourinary: Negative for flank pain.  Musculoskeletal: Negative for back pain and neck pain.  Skin: Negative for rash.  Neurological:  Negative for headaches.  Hematological: Does not bruise/bleed easily.  Psychiatric/Behavioral: Negative for confusion.      Allergies  Coconut oil; Lansoprazole; Penicillins; Statins; Lisinopril; and Lactose intolerance (gi)  Home Medications   Current Outpatient Rx  Name  Route  Sig  Dispense  Refill  . apixaban (ELIQUIS) 5 MG TABS tablet   Oral   Take 5 mg by mouth 2 (two) times daily.         [name] aspirin EC 81 MG tablet   Oral   Take 81 mg by mouth  daily.         . carvedilol (COREG) 6.25 MG tablet   Oral   Take 6.25 mg by mouth 2 (two) times daily with a meal.         . diltiazem (CARDIZEM) 60 MG tablet   Oral   Take 1 tablet (60 mg total) by mouth daily as needed. Take 1 tablet (60 mg) daily as needed for sustained racing palpitations.   30 tablet   4   . ezetimibe (ZETIA) 10 MG tablet   Oral   Take 10 mg by mouth at bedtime.          . fexofenadine (ALLEGRA) 180 MG tablet   Oral   Take 180 mg by mouth daily as needed for allergies.          . fluticasone (FLONASE) 50 MCG/ACT nasal spray   Each Nare   Place 2 sprays into both nostrils daily as needed for allergies.          . folic acid-pyridoxine-cyancobalamin (FOLTX) 2.5-25-2 MG TABS   Oral   Take 1 tablet by mouth daily.          . hyoscyamine (LEVBID) 0.375 MG 12 hr tablet   Oral   Take 1 tablet (0.375 mg total) by mouth 2 (two) times daily.   60 tablet   5   . levothyroxine (SYNTHROID, LEVOTHROID) 200 MCG tablet   Oral   Take 200 mcg by mouth daily before breakfast.          . nitroGLYCERIN (NITROSTAT) 0.4 MG SL tablet   Sublingual   Place 0.4 mg under the tongue every 5 (five) minutes as needed for chest pain.          [name] omega-3 acid ethyl esters (LOVAZA) 1 G capsule   Oral   Take 1 g by mouth daily.         . pantoprazole (PROTONIX) 40 MG tablet   Oral   Take 40 mg by mouth 2 (two) times daily as needed (gerd).          . polyethylene glycol (MIRALAX / GLYCOLAX)  packet   Oral   Take 17 g by mouth daily as needed (for constipation).          . raloxifene (EVISTA) 60 MG tablet   Oral   Take 60 mg by mouth daily.         [name] spironolactone (ALDACTONE) 25 MG tablet   Oral   Take 25 mg by mouth daily.          BP 97/46  Pulse 170  Temp(Src) 97.3 F (36.3 C) (Oral)  Resp 20  Wt 140 lb (63.504 kg)  SpO2 100% Physical Exam  Nursing note and vitals reviewed. Constitutional: She is oriented to person, place, and time. She appears well-developed. She appears distressed.  Severely tachycardic  HENT:  Head: Atraumatic.  Mouth/Throat: Oropharynx is clear and moist.  Eyes: Conjunctivae are normal. No scleral icterus.  Neck: Neck supple. No tracheal deviation present. No thyromegaly present.  Cardiovascular: Normal heart sounds and intact distal pulses.   Tachycardic, irregular  Pulmonary/Chest: Effort normal and breath sounds normal. No respiratory distress. She exhibits no tenderness.  Abdominal: Soft. Normal appearance. She exhibits no distension. There is no tenderness.  Musculoskeletal: She exhibits no edema and no tenderness.  Neurological: She is alert and oriented to person, place, and time.  Skin: Skin is warm and dry. No rash noted. She is not diaphoretic.  Psychiatric:  Mildly anxious    ED Course  Procedures (including critical care time)   Results for orders placed during the hospital encounter of 05/21/13  CBC WITH DIFFERENTIAL      Result Value Ref Range   WBC 3.8 (*) 4.0 - 10.5 K/uL   RBC 4.26  3.87 - 5.11 MIL/uL   Hemoglobin 15.2 (*) 12.0 - 15.0 g/dL   HCT 42.3  36.0 - 46.0 %   MCV 99.3  78.0 - 100.0 fL   MCH 35.7 (*) 26.0 - 34.0 pg   MCHC 35.9  30.0 - 36.0 g/dL   RDW 15.8 (*) 11.5 - 15.5 %   Platelets 166  150 - 400 K/uL   Neutrophils Relative % 35 (*) 43 - 77 %   Neutro Abs 1.4 (*) 1.7 - 7.7 K/uL   Lymphocytes Relative 41  12 - 46 %   Lymphs Abs 1.6  0.7 - 4.0 K/uL   Monocytes Relative 22 (*) 3 - 12 %    Monocytes Absolute 0.8  0.1 - 1.0 K/uL   Eosinophils Relative 1  0 - 5 %   Eosinophils Absolute 0.0  0.0 - 0.7 K/uL   Basophils Relative 1  0 - 1 %   Basophils Absolute 0.0  0.0 - 0.1 K/uL  COMPREHENSIVE METABOLIC PANEL      Result Value Ref Range   Sodium 138  137 - 147 mEq/L   Potassium 4.9  3.7 - 5.3 mEq/L   Chloride 98  96 - 112 mEq/L   CO2 23  19 - 32 mEq/L   Glucose, Bld 129 (*) 70 - 99 mg/dL   BUN 16  6 - 23 mg/dL   Creatinine, Ser 1.13 (*) 0.50 - 1.10 mg/dL   Calcium 9.2  8.4 - 10.5 mg/dL   Total Protein 7.9  6.0 - 8.3 g/dL   Albumin 3.9  3.5 - 5.2 g/dL   AST 75 (*) 0 - 37 U/L   ALT 33  0 - 35 U/L   Alkaline Phosphatase 63  39 - 117 U/L   Total Bilirubin 0.8  0.3 - 1.2 mg/dL   GFR calc non Af Amer 51 (*) >90 mL/min   GFR calc Af Amer 59 (*) >90 mL/min  TROPONIN I      Result Value Ref Range   Troponin I <0.30  <0.30 ng/mL   Dg Chest 2 View  04/22/2013   CLINICAL DATA:  Chest pain  EXAM: CHEST  2 VIEW  COMPARISON:  DG CHEST 1V PORT dated 04/10/2013  FINDINGS: The heart size and mediastinal contours are within normal limits. Both lungs are clear. The visualized skeletal structures are unremarkable.  IMPRESSION: No active cardiopulmonary disease.   Electronically Signed   By: Kathreen Devoid   On: 04/22/2013 13:11   Dg Chest Port 1 View  05/21/2013   CLINICAL DATA:  63 year old female with mid chest pain. Shortness of Breath. Initial encounter.  EXAM: PORTABLE CHEST - 1 VIEW  COMPARISON:  04/22/2013 and earlier.  FINDINGS: Portable AP semi upright view at 1344 hrs. Stable lung volumes. Stable cardiomegaly and mediastinal contours. No pneumothorax or pulmonary edema. No pleural effusion. No acute or confluent pulmonary opacity. Multilevel thoracic compression fractures previously augmented. Partially visible lumbar fusion hardware.  IMPRESSION: No acute cardiopulmonary abnormality.   Electronically Signed   By: Lars Pinks M.D.   On: 05/21/2013 14:27      EKG  Interpretation   Date/Time:  Tuesday May 21 2013  13:08:52 EST Ventricular Rate:  171 PR Interval:    QRS Duration: 90 QT Interval:  280 QTC Calculation: 472 R Axis:   -75 Text Interpretation:  Atrial fibrillation with rapid ventricular response  Left axis deviation Non-specific ST-t changes Confirmed by Ashok Cordia  MD,  Lennette Bihari ([id number]) on 05/21/2013 1:25:00 PM      MDM  Iv ns. Continuous pulse ox and monitor. o2 [place]. Labs. Ecg. Cxr.  Pt w afib, very rapid ventricular response, hr 170's.   cardizem iv bolus and drip.  Cardiology on call paged.  Reviewed nursing notes and prior charts for additional history.   Reviewed prior charts, hx ischemic cardiomyopathy, afib.  CRITICAL CARE  Re severe tachycardia, chest pain, dyspnea, afib w rapid ventricular response.  Performed by: Mirna Mires Total critical care time: 40 Critical care time was exclusive of separately billable procedures and treating other patients. Critical care was necessary to treat or prevent imminent or life-threatening deterioration. Critical care was time spent personally by me on the following activities: development of treatment plan with patient and/or surrogate as well as nursing, discussions with consultants, evaluation of patient's response to treatment, examination of patient, obtaining history from patient or surrogate, ordering and performing treatments and interventions, ordering and review of laboratory studies, ordering and review of radiographic studies, pulse oximetry and re-evaluation of patient's condition.  Recheck rate controlled, still afib. Card coming to see.   Cardiology, Dr Rayann Heman, has seen in ed, he states d/c home on amiodarone 200 mg bid x 1 week, then 200 mg a day, and they will set up close follow up appointment.   amio 200 mg po in ED.   Called Dr Rayann Heman back as pts hr back up to 150, no cp or sob, also discussed that pt took her cardizem dose today at approx 0400.  He states to give  additional dose cardizem 60 mg po in ED, and d/c to home w prior plan.  cardizem po.  Hr 108, pt asymptomatic.      Mirna Mires, MD 05/21/13 [phone]

## 2013-05-21 NOTE — ED Provider Notes (Signed)
Received call from nursing because pt's heart rate increased.  She looks like she is in atrial flutter.  Restarted her Cardizem drip and re-consulted cardiology.   Houston Siren III, MD 05/21/13 2258

## 2013-05-21 NOTE — ED Notes (Signed)
Meal given to patient 

## 2013-05-21 NOTE — Consult Note (Signed)
ELECTROPHYSIOLOGY CONSULT NOTE    Patient ID: [name] MRN: [id number], DOB/AGE: February 10, 1951 63 y.o.  Date of Consult: 05/21/2013  Primary Physician: [name], MD Primary Cardiologist: Pernell Dupre, MD Electrophysiologist: Caryl Comes  Reason for Consultation: atrial arrhythmias  HPI:  [name] is a 63 y.o. female with a past medical history significant for ICM (s/p ICD implantation in 2005; gen change 2006; dual chamber upgrade 2014 with subsequent extraction due to infection 11/2012), CAD (last intervention 2004, last cath 2012, myoview 04/2013 with no ischemia), prior CVA, chronic kidney disease, and atrial arrhythmias.  She states she has had atrial fibrillation for years.  She was maintained on Amiodarone but this was discontinued 09/2012 due to LFT dysfunction. After discontinuation, her LFT's normalized but have since become elevated again despite not taking amiodarone.  [place] 12/13 revealed fatty infiltration of the liver.  For the last month, she has had increasing frequency and severity of her atrial fibrillation requiring admission.  She awoke about 3AM this morning and was in an arrhythmia.  She took a short acting Diltiazem as directed and felt better for a couple of hours.  She then developed recurrent tachypalpitations.  When her physical therapist arrived, her heart rate was in the 170's.  She called the office and was advised to come to the ER for further evaluation.  Since arrival, she has been placed on IV Cardizem and has converted to 2:1 atrial tach with improvement in her symptoms.    She states that she has had chills and felt hot the last 2 nights.  She also complains of "indestion" when her heart rates are elevated.  She otherwise denies shortness of breath, syncope, dizziness, nausea, or diarrhea.   Last echo 03/2013 demonstrated EF 20-25%, akinesis of anteroseptal and apical myocardium, mild AR, moderate MR, LA 47.  Uptitration of rate controlling agents has been  limited in the past due to bradycardia.   EP has been asked to evaluate for treatment options.  ROS is negative except as outlined above.   Past Medical History  Diagnosis Date  . Spinal stenosis   . Numbness of foot     left foot  . Diverticulitis     a. s/p partial colectomy 1/12 with reversal in July 2012.  . Colitis, ischemic   . DJD (degenerative joint disease)     History of multiple surgeries to the back, shoulder and knee  . Ischemic cardiomyopathy     a. Echo 06/16/10: EF 25-30%, anteroseptal and apical hypokinesis, moderate AI, mild MR.  . Chronic systolic heart failure   . CAD (coronary artery disease)     a.  Ant MI 8/03 with stenting of the LAD;  b. staged PCI with Cypher DES to OM1 8/03;  c. s/p Cypher DES to LAD 7/04;  d.  multiple cardiac caths in past (chronically abnl ECG);    e. cardiac catheterization 3/12: LAD stent patent, distal LAD 40-50%, small ostial D1 80%, ostial D2 60%, OM-1 stent patent (20-30%), proximal RCA 50%, proximal to mid RCA 40-50%; f. cath 02/17/2011 - nonobs  . LV (left ventricular) mural thrombus   . CKD (chronic kidney disease), stage III     creatinine:  1.3 in 4/12;    . Tobacco abuse     history  . GERD (gastroesophageal reflux disease)   . Hypertension   . Lower GI bleed     August 2011  . Hypothyroidism   . Aortic insufficiency   . Pseudoaneurysm  History of, right forearm  . Hyperlipidemia   . Compression fracture 07/04/11    L2  . 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 AICD (automatic cardioverter/defibrillator) present     Implanted for primary prevention. Device revision 2006. Repeat operation at [facility] wtih chronic smoldering infection 11/2012 s/p device extraction and subsequent LifeVest.  . PAF (paroxysmal atrial fibrillation)   . Anatomical narrow angle of right eye   . Blood transfusion     "must have been  when I had the total knee replacement" (04/10/2013)  . Anemia   . H/O hiatal hernia   . Sinus bradycardia 03/05/2012  . Paroxysmal atrial flutter   . Mitral regurgitation   . Heart murmur   . CHF (congestive heart failure)     "3-4 times" (04/10/2013)  . Myocardial infarction 2002  . Stroke     History of TIA and possible CVA; hospitalized 2004; on coumadin  . Stroke     "I've had 3"; denies residual on 04/10/2013  . Chronic lower back pain      Surgical History:  Past Surgical History  Procedure Laterality Date  . Back surgery    . Cardiac defibrillator placement  07/2003; 10/2004  . Lumbar laminectomy/decompression microdiscectomy  10/2001    L5-S1/E-chart  . Knee arthroscopy Right   . Thyroidectomy  1970's  . Total knee arthroplasty Left 11/2003  . X-stop implantation  12/2004    L3-4; L4-5  . Fixation kyphoplasty thoracic spine  07/2007    T3, 4, 6 compression fractures  . Shoulder open rotator cuff repair Right 04/2008  . Lumbar laminectomy/decompression microdiscectomy  01/2010  . Colectomy  03/2010    sigmoid left  . Colostomy  03/2010    transverse  . Peripherally inserted central catheter insertion  03/2010    removed upon discharge  . Colostomy closure  12/2010    reversal  . Tonsillectomy and adenoidectomy  1969  . Appendectomy  03/2010  . Glaucoma surgery Right   . Biv icd genertaor change out  12/17/2012    "right now I don't have a defibrillator; I've been using an external one" (04/10/2013)  . Icd lead removal Left 12/17/2012    Procedure: ICD LEAD REMOVAL;  Surgeon: Evans Lance, MD;  Location: [facility];  Service: Cardiovascular;  Laterality: Left;  . Coronary angioplasty with stent placement  10/2001; 09/2002; ?date    "I've got a total of 4" (04/10/2013)     Current outpatient prescriptions: apixaban (ELIQUIS) 5 MG TABS tablet, Take 5 mg by mouth 2 (two) times daily., Disp: , Rfl: aspirin EC 81 MG tablet, Take 81 mg by mouth daily., Disp: , Rfl: ;   carvedilol  (COREG) 6.25 MG tablet, Take 6.25 mg by mouth 2 (two) times daily with a meal., Disp: , Rfl:  diltiazem (CARDIZEM) 60 MG tablet, Take 1 tablet (60 mg total) by mouth daily as needed. Take 1 tablet (60 mg) daily as needed for sustained racing palpitations., Disp: 30 tablet, Rfl: 4;   ezetimibe (ZETIA) 10 MG tablet, Take 10 mg by mouth at bedtime. , Disp: , Rfl: ;  fexofenadine (ALLEGRA) 180 MG tablet, Take 180 mg by mouth daily as needed for allergies. , Disp: , Rfl:  fluticasone (FLONASE) 50 MCG/ACT nasal spray, Place 2 sprays into both nostrils daily as needed for allergies. , Disp: , Rfl: ;   folic acid-pyridoxine-cyancobalamin (FOLTX)  2.5-25-2 MG TABS, Take 1 tablet by mouth daily. , Disp: , Rfl: ;   hyoscyamine (LEVBID) 0.375 MG 12 hr tablet, Take 1 tablet (0.375 mg total) by mouth 2 (two) times daily., Disp: 60 tablet, Rfl: 5 levothyroxine (SYNTHROID, LEVOTHROID) 200 MCG tablet, Take 200 mcg by mouth daily before breakfast. , Disp: , Rfl: ;   nitroGLYCERIN (NITROSTAT) 0.4 MG SL tablet, Place 0.4 mg under the tongue every 5 (five) minutes as needed for chest pain. , Disp: , Rfl: ;   omega-3 acid ethyl esters (LOVAZA) 1 G capsule, Take 1 g by mouth daily., Disp: , Rfl:  pantoprazole (PROTONIX) 40 MG tablet, Take 40 mg by mouth 2 (two) times daily as needed (gerd). , Disp: , Rfl: ;   polyethylene glycol (MIRALAX / GLYCOLAX) packet, Take 17 g by mouth daily as needed (for constipation). , Disp: , Rfl: ;   raloxifene (EVISTA) 60 MG tablet, Take 60 mg by mouth daily., Disp: , Rfl: ;   spironolactone (ALDACTONE) 25 MG tablet, Take 25 mg by mouth daily., Disp: , Rfl:     Allergies:  Allergies  Allergen Reactions  . Coconut Oil Anaphylaxis and Hives  . Lansoprazole Nausea Only  . Penicillins Swelling    Throat swells  . Statins Other (See Comments)    cramps  . Lisinopril     Cough- but still tolerates it  . Lactose Intolerance (Gi) Diarrhea and Other (See Comments)    Patient reports  abdominal cramping and diarrhea with lactose products.     History   Social History  . Marital Status: Widowed    Spouse Name: N/A    Number of Children: N/A  . Years of Education: N/A   Occupational History  . Retired    Social History Main Topics  . Smoking status: Former Smoker -- 0.10 packs/day for 33 years    Types: Cigarettes    Quit date: 04/21/2001  . Smokeless tobacco: Never Used     Comment: "smoked ~ 1 pack/month"  . Alcohol Use: No     Comment: 07/05/11 "occasionally; last time was glass of wine last week"  . Drug Use: No  . Sexual Activity: Not Currently   Other Topics Concern  . Not on file   Social History Narrative   Lives alone, has a house, has some household help. Handicapped upfitted.     Family History  Problem Relation Age of Onset  . Diabetes Mother   . Diabetes Brother   . Arthritis      family history  . Prostate cancer      Family History     BP 99/65  Pulse 87  Temp(Src) 98.8 F (37.1 C) (Oral)  Resp 12  Wt 140 lb (63.504 kg)  SpO2 98%  Labs:   Lab Results  Component Value Date   WBC 3.8* 05/21/2013   HGB 15.2* 05/21/2013   HCT 42.3 05/21/2013   MCV 99.3 05/21/2013   PLT 166 05/21/2013    Recent Labs Lab 05/21/13 1318  NA 138  K 4.9  CL 98  CO2 23  BUN 16  CREATININE 1.13*  CALCIUM 9.2  PROT 7.9  BILITOT 0.8  ALKPHOS 63  ALT 33  AST 75*  GLUCOSE 129*   Lab Results  Component Value Date   CKTOTAL 79 09/15/2011   CKMB 2.2 09/15/2011   TROPONINI <0.30 05/21/2013   Physical Exam: Filed Vitals:   05/21/13 1345 05/21/13 1400 05/21/13 1436 05/21/13 1549  BP: 97/46  103/80 99/65 106/65  Pulse:  87    Temp:   98.8 F (37.1 C) 98.2 F (36.8 C)  TempSrc:   Oral Oral  Resp: 20 9 12 13   Weight:      SpO2:  100% 98% 100%    GEN- The patient is elderly appearing (appears older than her stated age), alert and oriented x 3 today.   Head- normocephalic, atraumatic Eyes-  Sclera clear, conjunctiva pink Ears- hearing  intact Oropharynx- clear Neck- supple,  Lungs- Clear to ausculation bilaterally, normal work of breathing Heart- irregular rate and rhythm  GI- soft, NT, ND, + BS Extremities- no clubbing, cyanosis, or edema MS- diffuse muscle atrophy Skin- no rash or lesion Psych- euthymic mood, full affect Neuro- strength and sensation are intact    Radiology/Studies:  Dg Chest Port 1 View 05/21/2013   CLINICAL DATA:  63 year old female with mid chest pain. Shortness of Breath. Initial encounter.  EXAM: PORTABLE CHEST - 1 VIEW  COMPARISON:  04/22/2013 and earlier.  FINDINGS: Portable AP semi upright view at 1344 hrs. Stable lung volumes. Stable cardiomegaly and mediastinal contours. No pneumothorax or pulmonary edema. No pleural effusion. No acute or confluent pulmonary opacity. Multilevel thoracic compression fractures previously augmented. Partially visible lumbar fusion hardware.  IMPRESSION: No acute cardiopulmonary abnormality.   Electronically Signed   By: Lars Pinks M.D.   On: 05/21/2013 14:27    EKG:1:1 mid RP tachycardia, rate 179 EKG in SR (04-22-13) demonstrates prolonged QTc (522 msec).  TELEMETRY: 2:1 atrial tach, ventricular rate 80-100  DEVICE HISTORY: BSX single chamber ICD implanted 2005; generator change 2006 due to device recall; upgrade to dual chamber ICD 2014 with subsequent erosion and extraction 11/2012.  Review of previous device interrogations demonstrate atrial tach - no atrial fibrillation.   Assessment and Plan:  1. Atrial tachycardia I have reviewed ekgs and see mostly atrial tachycardia rather than afib.  She has very few medicine options.  She is also at high risk for ablation. Therapeutic strategies for atach/afib including medicine and ablation were discussed in detail with the patient today. Risk, benefits, and alternatives to EP study and radiofrequency ablation were also discussed in detail today. At this time, she would like to avoid ablation if possible.  As her LFTs  really never changed off of amiodarone, I think that it would be reasonable to retry amiodarone with close follow-up of her LFTs.  She would prefer this approach.  I had a long discussion regarding risks, benefits, and alternatives to amiodarone today.  She understands the risks and would still like to try this medicine. I have spoken with Dr Michail Sermon who has seen the patient in the past.  He asks that the patient contact his office for follow-up and further workup of chronically elevated LFTs.  Her liver function appears normal and [place] 2013 reveals fatty liver disease.  I do not see that seriologies for hepatitis/ other workup has been performed. Continue anticoagulation at this time.  Start amiodarone 200mg  BID x 7 days then 200mg  daily afterwards. Follow-up with Richardson Dopp in 3 weeks in our office.  She will need repeat LFTs then. Follow-up with Dr Michail Sermon at next available. I will see in the office in 6 weeks.  If she does not tolerate amiodarone or continues to have difficulty with atrial arrhythmias then she may be more willing to consider ablation at that time.  OK to discharge from the ER at this time.

## 2013-05-21 NOTE — ED Notes (Signed)
Pt's heart rate in the 150's.  EDP made aware.

## 2013-05-21 NOTE — ED Notes (Signed)
Pt converted back into NSR

## 2013-05-21 NOTE — ED Notes (Signed)
Cardiology at bedside.

## 2013-05-21 NOTE — ED Notes (Signed)
Pt noted to be in Afib again.  EDP made aware.

## 2013-05-21 NOTE — H&P (Signed)
Cardiology History and Physical   [name] is a 63 y.o. female who presents for evaluation of palpitations.  She was seen early today by Dr. Rayann Heman with detailed plan for discharge and close follow up.  However, prior to discharge, patient had return of palpitations and tachycardia due to atrial arrythmia.  She denied any shortness of breath or chest pain.  She will be admitted with plan continued as below.  See full consult note for further details.  O: BP 103/59  Pulse 114  Temp(Src) 98.7 F (37.1 C) (Oral)  Resp 22  Wt 63.504 kg (140 lb)  SpO2 100% General appearance: alert, cooperative, appears stated age and no distress Head: Normocephalic, without obvious abnormality, atraumatic Lungs: clear to auscultation bilaterally Chest wall: no tenderness Heart: irregular rate , S1, S2 normal Abdomen: soft, non-tender; bowel sounds normal; Extremities: extremities normal, atraumatic, no cyanosis or edema Pulses: 2+ and symmetric Neurologic: Grossly normal   ECG:  Did convert to sinus rhythm, however, no back in atrial tachycardia HR 116  Assessment/Plan  1. Atrial tachycardia  -Continue Amiodarone gtt, then transition to amiodarone 200mg  BID x 7 days then 200mg  daily afterwards.  -Continue anticoagulation, on Eliquis  2. Chronic systolic HF -Continue home meds -monitor strict I/Os, daily weights

## 2013-05-21 NOTE — Telephone Encounter (Signed)
Per [facility] nurse, pt is SOB, fatigued and heart rate is irregular. Pt went into afib over the weekend and has been taking diltiazem daily since. bp 130/60 hr 41-159 bpm. I spoke with Dr Tamala Julian and he advised that she go to ED, I called Trish/ card holder to update her with the need to call EP. Pt was informed and will go to ED.

## 2013-05-21 NOTE — Telephone Encounter (Signed)
New problem   Pt having SOB/fatigue and irregular heart rate.

## 2013-05-21 NOTE — ED Notes (Signed)
Pt's heart rate continues to fluctuate between 100-150.  Pt stating that she still does not feel well since reentering AFib.

## 2013-05-21 NOTE — ED Notes (Signed)
Pt has history of afib and therapist came was recommended patient come to ED.  Pt has HR 170-180, chest tightness, hypotensive at triage, mentation great.  Pt reports some sob

## 2013-05-22 LAB — BASIC METABOLIC PANEL
BUN: 15 mg/dL (ref 6–23)
CO2: 22 mEq/L (ref 19–32)
Calcium: 8.4 mg/dL (ref 8.4–10.5)
Chloride: 102 mEq/L (ref 96–112)
Creatinine, Ser: 1.02 mg/dL (ref 0.50–1.10)
GFR calc Af Amer: 66 mL/min — ABNORMAL LOW (ref >90)
GFR, EST NON AFRICAN AMERICAN: 57 mL/min — AB (ref >90)
Glucose, Bld: 103 mg/dL — ABNORMAL HIGH (ref 70–99)
POTASSIUM: 3.6 meq/L — AB (ref 3.7–5.3)
SODIUM: 139 meq/L (ref 137–147)

## 2013-05-22 LAB — MAGNESIUM: Magnesium: 1.7 mg/dL (ref 1.5–2.5)

## 2013-05-22 MED ORDER — SPIRONOLACTONE 25 MG PO TABS
25.0000 mg | ORAL_TABLET | Freq: Every day | ORAL | Status: DC
  Administered 2013-05-22 – 2013-05-23 (×2): 25 mg via ORAL
  Filled 2013-05-22 (×2): qty 1

## 2013-05-22 MED ORDER — SODIUM CHLORIDE 0.9 % IV SOLN: 250.0000 mL | INTRAVENOUS | Status: DC | PRN

## 2013-05-22 MED ORDER — PANTOPRAZOLE SODIUM 40 MG PO TBEC: 40.0000 mg | DELAYED_RELEASE_TABLET | Freq: Two times a day (BID) | ORAL | Status: DC | PRN

## 2013-05-22 MED ORDER — AMIODARONE HCL 200 MG PO TABS: 200.0000 mg | ORAL_TABLET | Freq: Every day | ORAL | Status: DC

## 2013-05-22 MED ORDER — SODIUM CHLORIDE 0.9 % IJ SOLN: 3.0000 mL | INTRAMUSCULAR | Status: DC | PRN

## 2013-05-22 MED ORDER — OMEGA-3-ACID ETHYL ESTERS 1 G PO CAPS
1.0000 g | ORAL_CAPSULE | Freq: Every day | ORAL | Status: DC
  Administered 2013-05-22 – 2013-05-23 (×2): 1 g via ORAL
  Filled 2013-05-22 (×2): qty 1

## 2013-05-22 MED ORDER — AMIODARONE HCL IN DEXTROSE 360-4.14 MG/200ML-% IV SOLN
60.0000 mg/h | INTRAVENOUS | Status: AC
  Administered 2013-05-22: 60 mg/h via INTRAVENOUS
  Filled 2013-05-22: qty 200

## 2013-05-22 MED ORDER — AMIODARONE HCL 200 MG PO TABS
200.0000 mg | ORAL_TABLET | Freq: Two times a day (BID) | ORAL | Status: DC
  Administered 2013-05-22 – 2013-05-23 (×3): 200 mg via ORAL
  Filled 2013-05-22 (×4): qty 1

## 2013-05-22 MED ORDER — ALUM & MAG HYDROXIDE-SIMETH 200-200-20 MG/5ML PO SUSP
30.0000 mL | ORAL | Status: AC
  Administered 2013-05-22: 30 mL via ORAL

## 2013-05-22 MED ORDER — HYOSCYAMINE SULFATE ER 0.375 MG PO TB12
0.3750 mg | ORAL_TABLET | Freq: Two times a day (BID) | ORAL | Status: DC
  Administered 2013-05-22 – 2013-05-23 (×3): 0.375 mg via ORAL
  Filled 2013-05-22 (×4): qty 1

## 2013-05-22 MED ORDER — AMIODARONE HCL IN DEXTROSE 360-4.14 MG/200ML-% IV SOLN
30.0000 mg/h | INTRAVENOUS | Status: DC
  Administered 2013-05-22: 30 mg/h via INTRAVENOUS
  Filled 2013-05-22 (×5): qty 200

## 2013-05-22 MED ORDER — RALOXIFENE HCL 60 MG PO TABS
60.0000 mg | ORAL_TABLET | Freq: Every day | ORAL | Status: DC
  Administered 2013-05-22 – 2013-05-23 (×2): 60 mg via ORAL
  Filled 2013-05-22 (×2): qty 1

## 2013-05-22 MED ORDER — SODIUM CHLORIDE 0.9 % IJ SOLN
3.0000 mL | Freq: Two times a day (BID) | INTRAMUSCULAR | Status: DC
  Administered 2013-05-22 – 2013-05-23 (×3): 3 mL via INTRAVENOUS

## 2013-05-22 MED ORDER — LEVOTHYROXINE SODIUM 200 MCG PO TABS
200.0000 ug | ORAL_TABLET | Freq: Every day | ORAL | Status: DC
  Administered 2013-05-22 – 2013-05-23 (×2): 200 ug via ORAL
  Filled 2013-05-22 (×3): qty 1

## 2013-05-22 MED ORDER — APIXABAN 5 MG PO TABS
5.0000 mg | ORAL_TABLET | Freq: Two times a day (BID) | ORAL | Status: DC
  Administered 2013-05-22 – 2013-05-23 (×4): 5 mg via ORAL
  Filled 2013-05-22 (×5): qty 1

## 2013-05-22 MED ORDER — EZETIMIBE 10 MG PO TABS
10.0000 mg | ORAL_TABLET | Freq: Every day | ORAL | Status: DC
  Administered 2013-05-22: 10 mg via ORAL
  Filled 2013-05-22 (×2): qty 1

## 2013-05-22 MED ORDER — MAGNESIUM HYDROXIDE 400 MG/5ML PO SUSP: 30.0000 mL | ORAL | Status: DC

## 2013-05-22 MED ORDER — CARVEDILOL 6.25 MG PO TABS
6.2500 mg | ORAL_TABLET | Freq: Two times a day (BID) | ORAL | Status: DC
  Administered 2013-05-22 – 2013-05-23 (×3): 6.25 mg via ORAL
  Filled 2013-05-22 (×5): qty 1

## 2013-05-22 NOTE — Progress Notes (Signed)
Patient: [name] Date of Encounter: 05/22/2013, 8:43 AM Admit date: 05/21/2013     Subjective  [name] reports she is feeling better this AM with less palpitations. CP has resolved. No SOB.   Objective  Physical Exam: Vitals: BP 98/67  Pulse 109  Temp(Src) 98.6 F (37 C) (Oral)  Resp 12  Wt 143 lb 11.2 oz (65.182 kg)  SpO2 100% General: Well developed, well appearing 63 year old female in no acute distress. Neck: Supple. JVD not elevated. Lungs: Clear bilaterally to auscultation without wheezes, rales, or rhonchi. Breathing is unlabored. Heart: Irregular S1 S2 without murmurs, rubs, or gallops.  Abdomen: Soft, non-distended. Extremities: No clubbing or cyanosis. No edema.  Distal pedal pulses are 2+ and equal bilaterally. Neuro: Alert and oriented X 3. Moves all extremities spontaneously. No focal deficits.  Intake/Output:  Intake/Output Summary (Last 24 hours) at 05/22/13 0843 Last data filed at 05/22/13 0257  Gross per 24 hour  Intake      3 ml  Output      0 ml  Net      3 ml    Inpatient Medications:  . amiodarone  200 mg Oral Q12H   Followed by  . [START ON 05/29/2013] amiodarone  200 mg Oral Daily  . apixaban  5 mg Oral BID  . carvedilol  6.25 mg Oral BID WC  . ezetimibe  10 mg Oral QHS  . hyoscyamine  0.375 mg Oral BID  . levothyroxine  200 mcg Oral QAC breakfast  . omega-3 acid ethyl esters  1 g Oral Daily  . raloxifene  60 mg Oral Daily  . sodium chloride  3 mL Intravenous Q12H  . spironolactone  25 mg Oral Daily   . amiodarone (NEXTERONE PREMIX) 360 mg/200 mL dextrose      Labs:  Recent Labs  05/21/13 1318  NA 138  K 4.9  CL 98  CO2 23  GLUCOSE 129*  BUN 16  CREATININE 1.13*  CALCIUM 9.2    Recent Labs  05/21/13 1318  AST 75*  ALT 33  ALKPHOS 63  BILITOT 0.8  PROT 7.9  ALBUMIN 3.9    Recent Labs  05/21/13 1318  WBC 3.8*  NEUTROABS 1.4*  HGB 15.2*  HCT 42.3  MCV 99.3  PLT 166    Recent Labs  05/21/13 1321    TROPONINI <0.30    Radiology/Studies: Dg Chest Port 1 View  05/21/2013   CLINICAL DATA:  63 year old female with mid chest pain. Shortness of Breath. Initial encounter.  EXAM: PORTABLE CHEST - 1 VIEW  COMPARISON:  04/22/2013 and earlier.  FINDINGS: Portable AP semi upright view at 1344 hrs. Stable lung volumes. Stable cardiomegaly and mediastinal contours. No pneumothorax or pulmonary edema. No pleural effusion. No acute or confluent pulmonary opacity. Multilevel thoracic compression fractures previously augmented. Partially visible lumbar fusion hardware.  IMPRESSION: No acute cardiopulmonary abnormality.   Electronically Signed   By: Lars Pinks M.D.   On: 05/21/2013 14:27    Echocardiogram: Jan 2015 - EF 20-25%, akinesis of anteroseptal and apical myocardium, mild AR, moderate MR, LA 47 Telemetry: atrial tachycardia with V rate ~100 bpm   Assessment and Plan  [name] is a 63 year old woman with an ICM (s/p ICD implantation in 2005; gen change 2006; dual chamber upgrade 2014 with subsequent extraction due to infection 11/2012), CAD (last intervention 2004, last cath 2012, myoview 04/2013 with no ischemia), prior CVA, chronic kidney disease and atrial arrhythmias  who presented yesterday with chest pain, SOB and tachycardia.   1. Atrial tachycardia  - symptomatic but improved with better rate control this AM - of note, per Dr. Olin Pia recent visit Jan 2015, prolonged QT precluded use of Tikosyn; "Worsening heart failure in the setting of atrial arrhythmia with rapid response. She has few options. I have reviewed the tracings with [name] and [name] and will refer for catheter ablation. Rate control has not been successful. The other alternative would be CRT implanttation and AV ablation." - now on IV amiodarone with plans for amiodarone loading and close follow-up specifically to watch LFTs; she was maintained on amiodarone but this was discontinued July 2014 due to elevated LFTs. After discontinuation, her  LFTs normalized but have since become elevated again despite not taking amiodarone. [place] 12/13 revealed fatty infiltration of the liver. Dr. Michail Sermon has agreed to assist with this and his help is appreciated; per Dr. Jackalyn Lombard note yesterday, Dr. Michail Sermon requested that [name] contact his office for follow-up and further workup of chronically elevated LFTs.   Signed, EDMISTEN, BROOKE PA-C Continue amio IV for now and hopefully discharge in am  Options are not great; [name] not inclined towards ablation b/c risk

## 2013-05-22 NOTE — Progress Notes (Signed)
UR Completed.  [name] [phone] 05/22/2013  

## 2013-05-22 NOTE — Progress Notes (Signed)
  Amiodarone Drug - Drug Interaction Consult Note  Recommendations:  Amiodarone is metabolized by the cytochrome P450 system and therefore has the potential to cause many drug interactions. Amiodarone has an average plasma half-life of 50 days (range 20 to 100 days).   There is potential for drug interactions to occur several weeks or months after stopping treatment and the onset of drug interactions may be slow after initiating amiodarone.   []  Statins: Increased risk of myopathy. Simvastatin- restrict dose to 20mg  daily. Other statins: counsel patients to report any muscle pain or weakness immediately.  [x]  Anticoagulants: (APIXIBAN) Amiodarone can increase anticoagulant effect. Consider warfarin dose reduction. Patients should be monitored closely and the dose of anticoagulant altered accordingly, remembering that amiodarone levels take several weeks to stabilize.  []  Antiepileptics: Amiodarone can increase plasma concentration of phenytoin, the dose should be reduced. Note that small changes in phenytoin dose can result in large changes in levels. Monitor patient and counsel on signs of toxicity.  []  Beta blockers: increased risk of bradycardia, AV block and myocardial depression. Sotalol - avoid concomitant use.  []   Calcium channel blockers (diltiazem and verapamil): increased risk of bradycardia, AV block and myocardial depression.  []   Cyclosporine: Amiodarone increases levels of cyclosporine. Reduced dose of cyclosporine is recommended.  []  Digoxin dose should be halved when amiodarone is started.  []  Diuretics: increased risk of cardiotoxicity if hypokalemia occurs.  []  Oral hypoglycemic agents (glyburide, glipizide, glimepiride): increased risk of hypoglycemia. Patient's glucose levels should be monitored closely when initiating amiodarone therapy.   []  Drugs that prolong the QT interval:  Torsades de pointes risk may be increased with concurrent use - avoid if possible.   Monitor QTc, also keep magnesium/potassium WNL if concurrent therapy can't be avoided. [name] Antibiotics: e.g. fluoroquinolones, erythromycin. . Antiarrhythmics: e.g. quinidine, procainamide, disopyramide, sotalol. . Antipsychotics: e.g. phenothiazines, haloperidol.  . Lithium, tricyclic antidepressants, and methadone.  Thank You.

## 2013-05-23 LAB — BASIC METABOLIC PANEL
BUN: 15 mg/dL (ref 6–23)
CALCIUM: 8.7 mg/dL (ref 8.4–10.5)
CO2: 25 mEq/L (ref 19–32)
CREATININE: 1.25 mg/dL — AB (ref 0.50–1.10)
Chloride: 101 mEq/L (ref 96–112)
GFR calc Af Amer: 52 mL/min — ABNORMAL LOW (ref >90)
GFR calc non Af Amer: 45 mL/min — ABNORMAL LOW (ref >90)
Glucose, Bld: 108 mg/dL — ABNORMAL HIGH (ref 70–99)
Potassium: 4.3 mEq/L (ref 3.7–5.3)
Sodium: 138 mEq/L (ref 137–147)

## 2013-05-23 MED ORDER — AMIODARONE HCL 200 MG PO TABS: ORAL_TABLET | ORAL | Status: DC

## 2013-05-23 NOTE — Progress Notes (Signed)
Came to visit patient on behalf of [facility] [employer] [facility]. She had been active in the past. Consents signed again. She will receive post hospital discharge call and will be evaluated for monthly home visits. Will make inpatient RNCM aware.  Marthenia Rolling, MSN- RN, BSN- [facility] [phone]

## 2013-05-23 NOTE — Progress Notes (Signed)
Patient: [name] Date of Encounter: 05/23/2013, 2:23 PM Admit date: 05/21/2013     Subjective  [name] reports nausea overnight but none today. She denies CP, SOB or palpitations.   Objective  Physical Exam: Vitals: BP 100/61  Pulse 62  Temp(Src) 97.4 F (36.3 C) (Oral)  Resp 18  Wt 145 lb 8.1 oz (66 kg)  SpO2 100% General: Well developed, well appearing 63 year old female in no acute distress. Neck: Supple. JVD not elevated. Lungs: Clear bilaterally to auscultation without wheezes, rales, or rhonchi. Breathing is unlabored. Heart: RRR S1 S2 without murmurs, rubs, or gallops.  Abdomen: Soft, non-distended. Extremities: No clubbing or cyanosis. No edema.  Distal pedal pulses are 2+ and equal bilaterally. Neuro: Alert and oriented X 3. Moves all extremities spontaneously. No focal deficits.  Intake/Output:  Intake/Output Summary (Last 24 hours) at 05/23/13 1423 Last data filed at 05/23/13 1230  Gross per 24 hour  Intake    720 ml  Output   1000 ml  Net   -280 ml    Inpatient Medications:  . amiodarone  200 mg Oral Q12H   Followed by  . [START ON 05/29/2013] amiodarone  200 mg Oral Daily  . apixaban  5 mg Oral BID  . carvedilol  6.25 mg Oral BID WC  . ezetimibe  10 mg Oral QHS  . hyoscyamine  0.375 mg Oral BID  . levothyroxine  200 mcg Oral QAC breakfast  . omega-3 acid ethyl esters  1 g Oral Daily  . raloxifene  60 mg Oral Daily  . sodium chloride  3 mL Intravenous Q12H  . spironolactone  25 mg Oral Daily   . amiodarone (NEXTERONE PREMIX) 360 mg/200 mL dextrose 30 mg/hr (05/22/13 1035)    Labs:  Recent Labs  05/22/13 0500 05/23/13 0238  NA 139 138  K 3.6* 4.3  CL 102 101  CO2 22 25  GLUCOSE 103* 108*  BUN 15 15  CREATININE 1.02 1.25*  CALCIUM 8.4 8.7  MG 1.7  --     Recent Labs  05/21/13 1318  AST 75*  ALT 33  ALKPHOS 63  BILITOT 0.8  PROT 7.9  ALBUMIN 3.9    Recent Labs  05/21/13 1318  WBC 3.8*  NEUTROABS 1.4*  HGB 15.2*    HCT 42.3  MCV 99.3  PLT 166    Recent Labs  05/21/13 1321  TROPONINI <0.30    Radiology/Studies: Dg Chest Port 1 View 05/21/2013    FINDINGS: Portable AP semi upright view at 1344 hrs. Stable lung volumes. Stable cardiomegaly and mediastinal contours. No pneumothorax or pulmonary edema. No pleural effusion. No acute or confluent pulmonary opacity. Multilevel thoracic compression fractures previously augmented. Partially visible lumbar fusion hardware.   IMPRESSION: No acute cardiopulmonary abnormality.    Electronically Signed   By: Lars Pinks M.D.   On: 05/21/2013 14:27   Telemetry: SR    Assessment and Plan  [name] is a 63 year old woman with an ICM (s/p ICD implantation in 2005; gen change 2006; dual chamber upgrade 2014 with subsequent extraction due to infection 11/2012), CAD (last intervention 2004, last cath 2012, myoview 04/2013 with no ischemia), prior CVA, chronic kidney disease and atrial arrhythmias who presented yesterday with chest pain, SOB and tachycardia.   1. Atrial tachycardia  - now in SR - of note, per Dr. Olin Pia recent visit Jan 2015, prolonged QT precluded use of Tikosyn; "Worsening heart failure in the setting of atrial  arrhythmia with rapid response. She has few options. I have reviewed the tracings with JA and GT and will refer for catheter ablation. Rate control has not been successful. The other alternative would be CRT implanttation and AV ablation."  - now on amiodarone loading with plans for close follow-up specifically to watch LFTs; she was maintained on amiodarone but this was discontinued July 2014 due to elevated LFTs. After discontinuation, her LFTs normalized but have since become elevated again despite not taking amiodarone. [place] 12/13 revealed fatty infiltration of the liver. Dr. Michail Sermon has agreed to assist with this and his help is appreciated; per Dr. Jackalyn Lombard note, Dr. Michail Sermon requested that [name] contact his office for follow-up and further  workup of chronically elevated LFTs.   2. Renal insufficiency - serum Cr increased to 1.25 from 1.02, BUN unchanged  3. ICM, EF 20-25%, with chronic systolic HF - stable; euvolemic at this time  Signed, EDMISTEN, BROOKE PA-C  I have seen, examined the patient, and reviewed the above assessment and plan.  Changes to above are made where necessary.  Pt doing much better today and is ready to discharge. Outpatient follow-up planned.  Given previously infected/ extracted device.  I would advise against AV nodal ablation in the future.  Hopefully she will do well with amiodarone. DC to home today.  Co Sign: Thompson Grayer, MD 05/23/2013 3:05 PM

## 2013-05-23 NOTE — Discharge Summary (Signed)
ELECTROPHYSIOLOGY DISCHARGE SUMMARY   Patient ID: [name],  MRN: [id number], DOB/AGE: 07-13-1950 63 y.o.  Admit date: 05/21/2013 Discharge date: 05/23/2013  Primary Care Physician: Becky Calico, MD Primary Cardiologist: Becky Schick, MD Primary EP: Becky Nap, MD  Primary Discharge Diagnosis:  1. Atrial tachycardia  Secondary Discharge Diagnoses:  1. Ischemic CM, EF 20-25% 2. Chronic systolic HF 3. Previous ICD implant s/p system extraction due to infection Sept 2014 4. CAD 5. CKD 6. Prior CVA  Procedures This Admission:  None   History and Hospital Course:  [name] is a 63 year old woman with an ICM (s/p ICD implantation in 2005; gen change 2006; dual chamber upgrade 2014 with subsequent extraction due to infection 11/2012), CAD (last intervention 2004, last cath 2012, myoview 04/2013 with no ischemia), prior CVA, chronic kidney disease and atrial arrhythmias who presented on 05/21/2013 with chest pain, SOB and tachycardia, found to have atrial tachycardia. In the ED, she was started on IV diltiazem with improvement in her rate and symptoms. Potassium 4.9. Magnesium 1.7. Troponin negative. She was evaluated by Dr. Rayann [name] who recommended AAD therapy with amiodarone and close follow-up as an outpatient. However, prior to discharge, her rate increased and her symptoms returned; therefore, she was started on IV amiodarone and admitted to telemetry. Her rate improved on IV amiodarone and she converted to SR. IV amiodarone was transitioned to PO. She remained hemodynamically stable. On 05/23/2013 she was seen, examined and deemed stable for discharge by Dr. Thompson [name]. She will follow-up on Monday, March 9 for BMET. She will see Dr. Michail [name] within 1-2 weeks after discharge for close follow-up of LFTs and Dr. Caryl [name] in 2 weeks.   Discharge Vitals: Blood pressure 100/61, pulse 62, temperature 97.4 F (36.3 C), temperature source Oral, resp. rate 18, weight 145 lb 8.1 oz (66 kg), SpO2  100.00%.   Labs: Lab Results  Component Value Date   WBC 3.8* 05/21/2013   HGB 15.2* 05/21/2013   HCT 42.3 05/21/2013   MCV 99.3 05/21/2013   PLT 166 05/21/2013    Recent Labs Lab 05/21/13 1318  05/23/13 0238  NA 138  < > 138  K 4.9  < > 4.3  CL 98  < > 101  CO2 23  < > 25  BUN 16  < > 15  CREATININE 1.13*  < > 1.25*  CALCIUM 9.2  < > 8.7  PROT 7.9  --   --   BILITOT 0.8  --   --   ALKPHOS 63  --   --   ALT 33  --   --   AST 75*  --   --   GLUCOSE 129*  < > 108*  < > = values in this interval not displayed. Lab Results  Component Value Date   CKTOTAL 79 09/15/2011   CKMB 2.2 09/15/2011   TROPONINI <0.30 05/21/2013    Disposition:  The patient is being discharged in stable condition.  Follow-up: Follow-up Information   Follow up with [name] On 05/27/2013. (At 10:00 AM for lab (BMET to recheck kidney function))    Specialty:  Cardiology   Contact information:   [address] [place] [address] [phone]      Follow up with Becky Axe, MD On 06/04/2013. (At 1:30 PM)    Specialty:  Cardiology   Contact information:   [address] [place] [address] [phone]  Follow up with Becky C., MD. Schedule an appointment as soon as possible for a visit in 1 week. (For hospital follow-up for elevated liver function studies)    Specialty:  Gastroenterology   Contact information:   [address] [place] [address] [phone]      Discharge Medications:    Medication List         amiodarone 200 MG tablet  Commonly known as:  PACERONE  Take 200 mg (1 tablet) 2x/day for one week, then take 200 mg (1 tablet) once a day     aspirin EC 81 MG tablet  Take 81 mg by mouth daily.     carvedilol 6.25 MG tablet  Commonly known as:  COREG  Take 6.25 mg by mouth 2 (two) times daily with a meal.     diltiazem 60 MG tablet  Commonly known as:  CARDIZEM  Take 60 mg by mouth  daily.     ELIQUIS 5 MG Tabs tablet  Generic drug:  apixaban  Take 5 mg by mouth 2 (two) times daily.     ezetimibe 10 MG tablet  Commonly known as:  ZETIA  Take 10 mg by mouth at bedtime.     fexofenadine 180 MG tablet  Commonly known as:  ALLEGRA  Take 180 mg by mouth daily as needed for allergies.     fluticasone 50 MCG/ACT nasal spray  Commonly known as:  FLONASE  Place 2 sprays into both nostrils daily as needed for allergies.     folic acid-pyridoxine-cyancobalamin 2.5-25-2 MG Tabs  Commonly known as:  FOLTX  Take 1 tablet by mouth daily.     hyoscyamine 0.375 MG 12 hr tablet  Commonly known as:  LEVBID  Take 1 tablet (0.375 mg total) by mouth 2 (two) times daily.     levothyroxine 200 MCG tablet  Commonly known as:  SYNTHROID, LEVOTHROID  Take 200 mcg by mouth daily before breakfast.     nitroGLYCERIN 0.4 MG SL tablet  Commonly known as:  NITROSTAT  Place 0.4 mg under the tongue every 5 (five) minutes as needed for chest pain.     omega-3 acid ethyl esters 1 G capsule  Commonly known as:  LOVAZA  Take 1 g by mouth daily.     pantoprazole 40 MG tablet  Commonly known as:  PROTONIX  Take 40 mg by mouth 2 (two) times daily as needed (gerd).     polyethylene glycol packet  Commonly known as:  MIRALAX / GLYCOLAX  Take 17 g by mouth daily as needed (for constipation).     raloxifene 60 MG tablet  Commonly known as:  EVISTA  Take 60 mg by mouth daily.     spironolactone 25 MG tablet  Commonly known as:  ALDACTONE  Take 25 mg by mouth daily.       Duration of Discharge Encounter: Greater than 30 minutes including physician time.  Becky Passey, PA-C 05/23/2013, 3:42 PM    Becky Grayer MD

## 2013-05-24 ENCOUNTER — Telehealth: Payer: Self-pay | Admitting: Internal Medicine

## 2013-05-24 ENCOUNTER — Other Ambulatory Visit: Payer: Self-pay | Admitting: *Deleted

## 2013-05-24 MED ORDER — AMIODARONE HCL 200 MG PO TABS: ORAL_TABLET | ORAL | Status: DC

## 2013-05-27 ENCOUNTER — Ambulatory Visit (INDEPENDENT_AMBULATORY_CARE_PROVIDER_SITE_OTHER): Payer: Medicare Other | Admitting: *Deleted

## 2013-05-27 DIAGNOSIS — I5022 Chronic systolic (congestive) heart failure: Secondary | ICD-10-CM

## 2013-05-27 DIAGNOSIS — I10 Essential (primary) hypertension: Secondary | ICD-10-CM

## 2013-05-27 LAB — BASIC METABOLIC PANEL
BUN: 18 mg/dL (ref 6–23)
CO2: 24 mEq/L (ref 19–32)
Calcium: 8.9 mg/dL (ref 8.4–10.5)
Chloride: 106 mEq/L (ref 96–112)
Creatinine, Ser: 1.2 mg/dL (ref 0.4–1.2)
GFR: 58.81 mL/min — ABNORMAL LOW (ref >60.00)
Glucose, Bld: 87 mg/dL (ref 70–99)
Potassium: 4.2 mEq/L (ref 3.5–5.1)
Sodium: 139 mEq/L (ref 135–145)

## 2013-06-04 ENCOUNTER — Inpatient Hospital Stay (HOSPITAL_COMMUNITY): Payer: Medicare Other

## 2013-06-04 ENCOUNTER — Inpatient Hospital Stay (HOSPITAL_COMMUNITY)
Admission: EM | Admit: 2013-06-04 | Discharge: 2013-06-11 | DRG: 286 | Disposition: A | Discharge status: Home Health | Payer: Medicare Other | Attending: Cardiology | Admitting: Cardiology

## 2013-06-04 ENCOUNTER — Encounter (HOSPITAL_COMMUNITY): Payer: Self-pay | Admitting: General Practice

## 2013-06-04 ENCOUNTER — Ambulatory Visit: Payer: Medicare Other | Admitting: Internal Medicine

## 2013-06-04 ENCOUNTER — Emergency Department (HOSPITAL_COMMUNITY): Payer: Medicare Other

## 2013-06-04 DIAGNOSIS — J9600 Acute respiratory failure, unspecified whether with hypoxia or hypercapnia: Secondary | ICD-10-CM | POA: Diagnosis present

## 2013-06-04 DIAGNOSIS — R092 Respiratory arrest: Secondary | ICD-10-CM | POA: Diagnosis present

## 2013-06-04 DIAGNOSIS — I5022 Chronic systolic (congestive) heart failure: Secondary | ICD-10-CM

## 2013-06-04 DIAGNOSIS — Z8673 Personal history of transient ischemic attack (TIA), and cerebral infarction without residual deficits: Secondary | ICD-10-CM

## 2013-06-04 DIAGNOSIS — R0902 Hypoxemia: Secondary | ICD-10-CM | POA: Diagnosis present

## 2013-06-04 DIAGNOSIS — I4891 Unspecified atrial fibrillation: Secondary | ICD-10-CM | POA: Diagnosis present

## 2013-06-04 DIAGNOSIS — I4892 Unspecified atrial flutter: Secondary | ICD-10-CM

## 2013-06-04 DIAGNOSIS — R011 Cardiac murmur, unspecified: Secondary | ICD-10-CM | POA: Diagnosis present

## 2013-06-04 DIAGNOSIS — I471 Supraventricular tachycardia: Secondary | ICD-10-CM

## 2013-06-04 DIAGNOSIS — I119 Hypertensive heart disease without heart failure: Secondary | ICD-10-CM | POA: Diagnosis present

## 2013-06-04 DIAGNOSIS — E039 Hypothyroidism, unspecified: Secondary | ICD-10-CM | POA: Diagnosis present

## 2013-06-04 DIAGNOSIS — Z981 Arthrodesis status: Secondary | ICD-10-CM

## 2013-06-04 DIAGNOSIS — N183 Chronic kidney disease, stage 3 unspecified: Secondary | ICD-10-CM | POA: Diagnosis present

## 2013-06-04 DIAGNOSIS — R000 Tachycardia, unspecified: Secondary | ICD-10-CM | POA: Diagnosis present

## 2013-06-04 DIAGNOSIS — Z91018 Allergy to other foods: Secondary | ICD-10-CM

## 2013-06-04 DIAGNOSIS — Z96659 Presence of unspecified artificial knee joint: Secondary | ICD-10-CM

## 2013-06-04 DIAGNOSIS — I10 Essential (primary) hypertension: Secondary | ICD-10-CM

## 2013-06-04 DIAGNOSIS — E872 Acidosis, unspecified: Secondary | ICD-10-CM | POA: Diagnosis present

## 2013-06-04 DIAGNOSIS — M25519 Pain in unspecified shoulder: Secondary | ICD-10-CM | POA: Diagnosis present

## 2013-06-04 DIAGNOSIS — Z888 Allergy status to other drugs, medicaments and biological substances status: Secondary | ICD-10-CM

## 2013-06-04 DIAGNOSIS — I2589 Other forms of chronic ischemic heart disease: Secondary | ICD-10-CM | POA: Diagnosis present

## 2013-06-04 DIAGNOSIS — Z880 Allergy status to penicillin: Secondary | ICD-10-CM

## 2013-06-04 DIAGNOSIS — E739 Lactose intolerance, unspecified: Secondary | ICD-10-CM | POA: Diagnosis present

## 2013-06-04 DIAGNOSIS — K219 Gastro-esophageal reflux disease without esophagitis: Secondary | ICD-10-CM | POA: Diagnosis present

## 2013-06-04 DIAGNOSIS — I480 Paroxysmal atrial fibrillation: Secondary | ICD-10-CM | POA: Diagnosis present

## 2013-06-04 DIAGNOSIS — I5023 Acute on chronic systolic (congestive) heart failure: Secondary | ICD-10-CM | POA: Diagnosis present

## 2013-06-04 DIAGNOSIS — Z833 Family history of diabetes mellitus: Secondary | ICD-10-CM

## 2013-06-04 DIAGNOSIS — M25512 Pain in left shoulder: Secondary | ICD-10-CM | POA: Diagnosis present

## 2013-06-04 DIAGNOSIS — J189 Pneumonia, unspecified organism: Secondary | ICD-10-CM

## 2013-06-04 DIAGNOSIS — I5042 Chronic combined systolic (congestive) and diastolic (congestive) heart failure: Secondary | ICD-10-CM

## 2013-06-04 DIAGNOSIS — Z7982 Long term (current) use of aspirin: Secondary | ICD-10-CM

## 2013-06-04 DIAGNOSIS — I2510 Atherosclerotic heart disease of native coronary artery without angina pectoris: Secondary | ICD-10-CM

## 2013-06-04 DIAGNOSIS — M1990 Unspecified osteoarthritis, unspecified site: Secondary | ICD-10-CM | POA: Diagnosis present

## 2013-06-04 DIAGNOSIS — I059 Rheumatic mitral valve disease, unspecified: Secondary | ICD-10-CM | POA: Diagnosis present

## 2013-06-04 DIAGNOSIS — I255 Ischemic cardiomyopathy: Secondary | ICD-10-CM | POA: Diagnosis present

## 2013-06-04 DIAGNOSIS — Z7901 Long term (current) use of anticoagulants: Secondary | ICD-10-CM

## 2013-06-04 DIAGNOSIS — R570 Cardiogenic shock: Secondary | ICD-10-CM | POA: Diagnosis present

## 2013-06-04 DIAGNOSIS — Z9861 Coronary angioplasty status: Secondary | ICD-10-CM

## 2013-06-04 DIAGNOSIS — N189 Chronic kidney disease, unspecified: Secondary | ICD-10-CM

## 2013-06-04 DIAGNOSIS — Z87891 Personal history of nicotine dependence: Secondary | ICD-10-CM

## 2013-06-04 DIAGNOSIS — J9690 Respiratory failure, unspecified, unspecified whether with hypoxia or hypercapnia: Secondary | ICD-10-CM

## 2013-06-04 DIAGNOSIS — E785 Hyperlipidemia, unspecified: Secondary | ICD-10-CM | POA: Diagnosis present

## 2013-06-04 DIAGNOSIS — I509 Heart failure, unspecified: Secondary | ICD-10-CM | POA: Diagnosis present

## 2013-06-04 DIAGNOSIS — I498 Other specified cardiac arrhythmias: Secondary | ICD-10-CM

## 2013-06-04 DIAGNOSIS — I5043 Acute on chronic combined systolic (congestive) and diastolic (congestive) heart failure: Principal | ICD-10-CM | POA: Diagnosis present

## 2013-06-04 DIAGNOSIS — I129 Hypertensive chronic kidney disease with stage 1 through stage 4 chronic kidney disease, or unspecified chronic kidney disease: Secondary | ICD-10-CM | POA: Diagnosis present

## 2013-06-04 LAB — I-STAT ARTERIAL BLOOD GAS, ED
ACID-BASE DEFICIT: 8 mmol/L — AB (ref 0.0–2.0)
Acid-base deficit: 2 mmol/L (ref 0.0–2.0)
BICARBONATE: 18.6 meq/L — AB (ref 20.0–24.0)
Bicarbonate: 22.1 mEq/L (ref 20.0–24.0)
O2 SAT: 96 %
O2 Saturation: 93 %
PO2 ART: 90 mmHg (ref 80.0–100.0)
Patient temperature: 98.6
Patient temperature: 98.6
TCO2: 20 mmol/L (ref 0–100)
TCO2: 23 mmol/L (ref 0–100)
pCO2 arterial: 40.7 mmHg (ref 35.0–45.0)
pCO2 arterial: 33.6 mmHg — ABNORMAL LOW (ref 35.0–45.0)
pH, Arterial: 7.267 — ABNORMAL LOW (ref 7.350–7.450)
pH, Arterial: 7.427 (ref 7.350–7.450)
pO2, Arterial: 66 mmHg — ABNORMAL LOW (ref 80.0–100.0)

## 2013-06-04 LAB — COMPREHENSIVE METABOLIC PANEL
ALBUMIN: 3.4 g/dL — AB (ref 3.5–5.2)
ALK PHOS: 60 U/L (ref 39–117)
ALT: 27 U/L (ref 0–35)
AST: 72 U/L — ABNORMAL HIGH (ref 0–37)
BILIRUBIN TOTAL: 0.5 mg/dL (ref 0.3–1.2)
BUN: 15 mg/dL (ref 6–23)
CHLORIDE: 102 meq/L (ref 96–112)
CO2: 19 mEq/L (ref 19–32)
Calcium: 8.8 mg/dL (ref 8.4–10.5)
Creatinine, Ser: 1.09 mg/dL (ref 0.50–1.10)
GFR calc Af Amer: 61 mL/min — ABNORMAL LOW (ref >90)
GFR calc non Af Amer: 53 mL/min — ABNORMAL LOW (ref >90)
Glucose, Bld: 266 mg/dL — ABNORMAL HIGH (ref 70–99)
POTASSIUM: 4.4 meq/L (ref 3.7–5.3)
Sodium: 142 mEq/L (ref 137–147)
Total Protein: 7.4 g/dL (ref 6.0–8.3)

## 2013-06-04 LAB — CBC WITH DIFFERENTIAL/PLATELET
Basophils Absolute: 0 10*3/uL (ref 0.0–0.1)
Basophils Relative: 1 % (ref 0–1)
Eosinophils Absolute: 0.1 10*3/uL (ref 0.0–0.7)
Eosinophils Relative: 1 % (ref 0–5)
HCT: 42.8 % (ref 36.0–46.0)
HEMOGLOBIN: 14.7 g/dL (ref 12.0–15.0)
Lymphocytes Relative: 41 % (ref 12–46)
Lymphs Abs: 2.6 10*3/uL (ref 0.7–4.0)
MCH: 35.8 pg — ABNORMAL HIGH (ref 26.0–34.0)
MCHC: 34.3 g/dL (ref 30.0–36.0)
MCV: 104.1 fL — ABNORMAL HIGH (ref 78.0–100.0)
MONOS PCT: 8 % (ref 3–12)
Monocytes Absolute: 0.5 10*3/uL (ref 0.1–1.0)
NEUTROS ABS: 3.2 10*3/uL (ref 1.7–7.7)
Neutrophils Relative %: 49 % (ref 43–77)
Platelets: 215 10*3/uL (ref 150–400)
RBC: 4.11 MIL/uL (ref 3.87–5.11)
RDW: 15.7 % — ABNORMAL HIGH (ref 11.5–15.5)
WBC: 6.5 10*3/uL (ref 4.0–10.5)

## 2013-06-04 LAB — MRSA PCR SCREENING: MRSA BY PCR: NEGATIVE

## 2013-06-04 LAB — HEPARIN LEVEL (UNFRACTIONATED): Heparin Unfractionated: <0.1 IU/mL — ABNORMAL LOW (ref 0.30–0.70)

## 2013-06-04 LAB — I-STAT CG4 LACTIC ACID, ED: Lactic Acid, Venous: 6.1 mmol/L — ABNORMAL HIGH (ref 0.5–2.2)

## 2013-06-04 LAB — PRO B NATRIURETIC PEPTIDE: Pro B Natriuretic peptide (BNP): 2458 pg/mL — ABNORMAL HIGH (ref 0–125)

## 2013-06-04 LAB — TROPONIN I
Troponin I: <0.3 ng/mL (ref <0.30)
Troponin I: <0.3 ng/mL (ref <0.30)

## 2013-06-04 LAB — APTT: APTT: 26 s (ref 24–37)

## 2013-06-04 MED ORDER — EZETIMIBE 10 MG PO TABS
10.0000 mg | ORAL_TABLET | Freq: Every day | ORAL | Status: DC
  Administered 2013-06-04 – 2013-06-10 (×7): 10 mg via ORAL
  Filled 2013-06-04 (×10): qty 1

## 2013-06-04 MED ORDER — VANCOMYCIN HCL 500 MG IV SOLR
500.0000 mg | Freq: Two times a day (BID) | INTRAVENOUS | Status: DC
  Administered 2013-06-04 – 2013-06-05 (×3): 500 mg via INTRAVENOUS
  Filled 2013-06-04 (×5): qty 500

## 2013-06-04 MED ORDER — OMEGA-3-ACID ETHYL ESTERS 1 G PO CAPS
1.0000 g | ORAL_CAPSULE | Freq: Every day | ORAL | Status: DC
  Administered 2013-06-04 – 2013-06-11 (×7): 1 g via ORAL
  Filled 2013-06-04 (×8): qty 1

## 2013-06-04 MED ORDER — LEVOTHYROXINE SODIUM 200 MCG PO TABS
200.0000 ug | ORAL_TABLET | Freq: Every day | ORAL | Status: DC
  Administered 2013-06-05: 200 ug via ORAL
  Filled 2013-06-04 (×4): qty 1

## 2013-06-04 MED ORDER — LORAZEPAM 2 MG/ML IJ SOLN
INTRAMUSCULAR | Status: AC
  Administered 2013-06-04: 0.5 mg
  Filled 2013-06-04: qty 1

## 2013-06-04 MED ORDER — LIDOCAINE HCL (CARDIAC) 20 MG/ML IV SOLN
INTRAVENOUS | Status: AC
Start: 2013-06-04 — End: 2013-06-04
  Filled 2013-06-04: qty 5

## 2013-06-04 MED ORDER — HYOSCYAMINE SULFATE ER 0.375 MG PO TB12
0.3750 mg | ORAL_TABLET | Freq: Two times a day (BID) | ORAL | Status: DC
  Administered 2013-06-04 – 2013-06-11 (×13): 0.375 mg via ORAL
  Filled 2013-06-04 (×16): qty 1

## 2013-06-04 MED ORDER — NITROGLYCERIN IN D5W 200-5 MCG/ML-% IV SOLN
2.0000 ug/min | Freq: Once | INTRAVENOUS | Status: AC
  Administered 2013-06-04: 5 ug/min via INTRAVENOUS
  Filled 2013-06-04: qty 250

## 2013-06-04 MED ORDER — HYDROCODONE-ACETAMINOPHEN 5-325 MG PO TABS
1.0000 | ORAL_TABLET | Freq: Once | ORAL | Status: AC
  Administered 2013-06-04: 1 via ORAL
  Filled 2013-06-04: qty 1

## 2013-06-04 MED ORDER — AMIODARONE LOAD VIA INFUSION
150.0000 mg | Freq: Once | INTRAVENOUS | Status: AC
  Administered 2013-06-04: 150 mg via INTRAVENOUS
  Filled 2013-06-04: qty 83.34

## 2013-06-04 MED ORDER — NITROGLYCERIN 0.4 MG SL SUBL: 0.4000 mg | SUBLINGUAL_TABLET | SUBLINGUAL | Status: DC | PRN

## 2013-06-04 MED ORDER — RALOXIFENE HCL 60 MG PO TABS
60.0000 mg | ORAL_TABLET | Freq: Every day | ORAL | Status: DC
  Administered 2013-06-04 – 2013-06-11 (×7): 60 mg via ORAL
  Filled 2013-06-04 (×8): qty 1

## 2013-06-04 MED ORDER — SUCCINYLCHOLINE CHLORIDE 20 MG/ML IJ SOLN
INTRAMUSCULAR | Status: AC
  Filled 2013-06-04: qty 1

## 2013-06-04 MED ORDER — FUROSEMIDE 10 MG/ML IJ SOLN
80.0000 mg | Freq: Once | INTRAMUSCULAR | Status: AC
  Administered 2013-06-04: 80 mg via INTRAVENOUS
  Filled 2013-06-04: qty 8

## 2013-06-04 MED ORDER — ONDANSETRON HCL 4 MG/2ML IJ SOLN
4.0000 mg | Freq: Once | INTRAMUSCULAR | Status: AC
  Administered 2013-06-04: 4 mg via INTRAVENOUS

## 2013-06-04 MED ORDER — ASPIRIN EC 81 MG PO TBEC
81.0000 mg | DELAYED_RELEASE_TABLET | Freq: Every day | ORAL | Status: DC
  Administered 2013-06-04 – 2013-06-05 (×2): 81 mg via ORAL
  Filled 2013-06-04 (×3): qty 1

## 2013-06-04 MED ORDER — PANTOPRAZOLE SODIUM 40 MG PO TBEC: 40.0000 mg | DELAYED_RELEASE_TABLET | Freq: Two times a day (BID) | ORAL | Status: DC | PRN

## 2013-06-04 MED ORDER — DEXTROSE 5 % IV SOLN
1.0000 g | Freq: Three times a day (TID) | INTRAVENOUS | Status: DC
  Administered 2013-06-04 – 2013-06-06 (×5): 1 g via INTRAVENOUS
  Filled 2013-06-04 (×8): qty 1

## 2013-06-04 MED ORDER — DILTIAZEM HCL 25 MG/5ML IV SOLN: 15.0000 mg | Freq: Once | INTRAVENOUS | Status: DC

## 2013-06-04 MED ORDER — HEPARIN (PORCINE) IN NACL 100-0.45 UNIT/ML-% IJ SOLN
700.0000 [IU]/h | INTRAMUSCULAR | Status: DC
  Administered 2013-06-04: 900 [IU]/h via INTRAVENOUS
  Administered 2013-06-05 (×2): 800 [IU]/h via INTRAVENOUS
  Filled 2013-06-04 (×2): qty 250

## 2013-06-04 MED ORDER — ROCURONIUM BROMIDE 50 MG/5ML IV SOLN
INTRAVENOUS | Status: AC
  Filled 2013-06-04: qty 2

## 2013-06-04 MED ORDER — DEXTROSE 5 % IV SOLN
2.0000 g | Freq: Once | INTRAVENOUS | Status: AC
  Administered 2013-06-04: 2 g via INTRAVENOUS
  Filled 2013-06-04: qty 2

## 2013-06-04 MED ORDER — HYDROCODONE-ACETAMINOPHEN 5-325 MG PO TABS
1.0000 | ORAL_TABLET | Freq: Four times a day (QID) | ORAL | Status: DC | PRN
  Administered 2013-06-04 – 2013-06-10 (×7): 1 via ORAL
  Filled 2013-06-04 (×7): qty 1

## 2013-06-04 MED ORDER — AMIODARONE HCL IN DEXTROSE 360-4.14 MG/200ML-% IV SOLN
60.0000 mg/h | INTRAVENOUS | Status: AC
  Administered 2013-06-04 (×2): 60 mg/h via INTRAVENOUS
  Filled 2013-06-04 (×2): qty 200

## 2013-06-04 MED ORDER — VANCOMYCIN HCL IN DEXTROSE 1-5 GM/200ML-% IV SOLN
1000.0000 mg | Freq: Once | INTRAVENOUS | Status: AC
  Administered 2013-06-04: 1000 mg via INTRAVENOUS
  Filled 2013-06-04: qty 200

## 2013-06-04 MED ORDER — AMIODARONE HCL IN DEXTROSE 360-4.14 MG/200ML-% IV SOLN
30.0000 mg/h | INTRAVENOUS | Status: DC
  Administered 2013-06-05 – 2013-06-07 (×6): 30 mg/h via INTRAVENOUS
  Filled 2013-06-04 (×13): qty 200

## 2013-06-04 MED ORDER — SPIRONOLACTONE 25 MG PO TABS
25.0000 mg | ORAL_TABLET | Freq: Every day | ORAL | Status: DC
  Administered 2013-06-04 – 2013-06-11 (×6): 25 mg via ORAL
  Filled 2013-06-04 (×8): qty 1

## 2013-06-04 MED ORDER — CARVEDILOL 6.25 MG PO TABS
6.2500 mg | ORAL_TABLET | Freq: Two times a day (BID) | ORAL | Status: DC
  Administered 2013-06-04 – 2013-06-05 (×3): 6.25 mg via ORAL
  Filled 2013-06-04 (×10): qty 1

## 2013-06-04 MED ORDER — FA-PYRIDOXINE-CYANOCOBALAMIN 2.5-25-2 MG PO TABS
1.0000 | ORAL_TABLET | Freq: Every day | ORAL | Status: DC
  Administered 2013-06-04 – 2013-06-11 (×7): 1 via ORAL
  Filled 2013-06-04 (×8): qty 1

## 2013-06-04 MED ORDER — ETOMIDATE 2 MG/ML IV SOLN
INTRAVENOUS | Status: AC
  Filled 2013-06-04: qty 20

## 2013-06-04 MED ORDER — POLYETHYLENE GLYCOL 3350 17 G PO PACK
17.0000 g | PACK | Freq: Every day | ORAL | Status: DC | PRN
  Administered 2013-06-07 – 2013-06-08 (×2): 17 g via ORAL
  Filled 2013-06-04 (×4): qty 1

## 2013-06-04 NOTE — ED Provider Notes (Signed)
CSN: [id number]     Arrival date & time 06/04/13  0865 History   First MD Initiated Contact with Patient 06/04/13 [phone]     Chief Complaint  Patient presents with  . Respiratory Distress     (Consider location/radiation/quality/duration/timing/severity/associated sxs/prior Treatment) HPI  This is a 63 year old female with a history of ischemic cardiomyopathy and CHF (last echo 20-25%), chronic kidney disease, and atrial tachycardia, coronary artery disease, tobacco abuse who presents with respiratory distress. Per EMS, patient was found at her home for acute respiratory distress.  Patient was noted to be hypoxic and tachypneic. Patient became unresponsive in route. EMS attempted intubate him were unsuccessful. Patient was bagged and had improvement of her mental status. Patient was given a neb without much improvement. Patient is able to answer yes and no questions at this time. She endorses shortness of breath that came on quickly. She denies any fevers.    Level V caveat for acuity of condition  Past Medical History  Diagnosis Date  . Spinal stenosis   . Numbness of foot     left foot  . Diverticulitis     a. s/p partial colectomy 1/12 with reversal in July 2012.  . Colitis, ischemic   . DJD (degenerative joint disease)     History of multiple surgeries to the back, shoulder and knee  . Ischemic cardiomyopathy     a. Echo 06/16/10: EF 25-30%, anteroseptal and apical hypokinesis, moderate AI, mild MR.  . Chronic systolic heart failure   . CAD (coronary artery disease)     a.  Ant MI 8/03 with stenting of the LAD;  b. staged PCI with Cypher DES to OM1 8/03;  c. s/p Cypher DES to LAD 7/04;  d.  multiple cardiac caths in past (chronically abnl ECG);    e. cardiac catheterization 3/12: LAD stent patent, distal LAD 40-50%, small ostial D1 80%, ostial D2 60%, OM-1 stent patent (20-30%), proximal RCA 50%, proximal to mid RCA 40-50%; f. cath 02/17/2011 - nonobs  . LV (left ventricular) mural  thrombus   . CKD (chronic kidney disease), stage III     creatinine:  1.3 in 4/12;    . Tobacco abuse     history  . GERD (gastroesophageal reflux disease)   . Hypertension   . Lower GI bleed     August 2011  . Hypothyroidism   . Aortic insufficiency   . Pseudoaneurysm     History of, right forearm  . Hyperlipidemia   . Compression fracture 07/04/11    L2  . 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 PAF (paroxysmal atrial fibrillation)   . Anatomical narrow angle of right eye   . Blood transfusion     "must have been when I had the total knee replacement" (04/10/2013)  . Anemia   . H/O hiatal hernia   . Sinus bradycardia 03/05/2012  . Paroxysmal atrial flutter   . Mitral regurgitation   . Heart murmur   . CHF (congestive heart failure)     "3-4 times" (04/10/2013)  . Myocardial infarction 2002  . Stroke     History of TIA and possible CVA; hospitalized 2004; on coumadin  . Stroke     "I've had 3"; denies residual on 04/10/2013  . Chronic lower back pain    Past Surgical History  Procedure Laterality Date  . Back surgery    .  Cardiac defibrillator placement  07/2003; 10/2004; 2014  . Lumbar laminectomy/decompression microdiscectomy  10/2001    L5-S1/E-chart  . Knee arthroscopy Right   . Thyroidectomy  1970's  . Total knee arthroplasty Left 11/2003  . X-stop implantation  12/2004    L3-4; L4-5  . Fixation kyphoplasty thoracic spine  07/2007    T3, 4, 6 compression fractures  . Shoulder open rotator cuff repair Right 04/2008  . Lumbar laminectomy/decompression microdiscectomy  01/2010  . Colectomy  03/2010    sigmoid left; transverse  . Peripherally inserted central catheter insertion  03/2010    removed upon discharge  . Colostomy reversal  12/2010  . Tonsillectomy and adenoidectomy  1969  . Appendectomy  03/2010  . Glaucoma surgery Right   . Biv icd genertaor  change out  12/17/2012    "right now I don't have a defibrillator; I've been using an external one" (06/04/2013)  . Icd lead removal Left 12/17/2012    Procedure: ICD LEAD REMOVAL;  Surgeon: Evans Lance, MD;  Location: [facility];  Service: Cardiovascular;  Laterality: Left;  . Coronary angioplasty with stent placement  10/2001; 09/2002; ?date    "I've got a total of 4" (04/10/2013)   Family History  Problem Relation Age of Onset  . Diabetes Mother   . Diabetes Brother   . Arthritis      family history  . Prostate cancer      Family History   History  Substance Use Topics  . Smoking status: Former Smoker -- 0.10 packs/day for 33 years    Types: Cigarettes    Quit date: 04/21/2001  . Smokeless tobacco: Never Used     Comment: "smoked ~ 1 pack/month"  . Alcohol Use: 0.6 oz/week    1 Glasses of wine per week   OB History   Grav Para Term Preterm Abortions TAB SAB Ect Mult Living                 Review of Systems  Unable to perform ROS: Acuity of condition      Allergies  Coconut oil; Lansoprazole; Penicillins; Statins; Lisinopril; and Lactose intolerance (gi)  Home Medications   No current outpatient prescriptions on file. BP 117/77  Pulse 104  Temp(Src) 98.9 F (37.2 C) (Oral)  Resp 17  Ht 5\' 6"  (1.676 m)  Wt 147 lb 11.3 oz (67 kg)  BMI 23.85 kg/m2  SpO2 98% Physical Exam  Nursing note and vitals reviewed. Constitutional: She is oriented to person, place, and time.  Patient being bagged, opens eyes spontaneously, will answer yes and no questions  HENT:  Head: Normocephalic.  Blood oozing from right nare  Eyes: Pupils are equal, round, and reactive to light.  Neck: Neck supple. JVD present.  Cardiovascular: Regular rhythm and normal heart sounds.   Tachycardia  Pulmonary/Chest: She is in respiratory distress. She has no wheezes.  Tachypnea, increased work of breathing with accessory muscle use, coarse Rales bilaterally  Abdominal: Soft. Bowel sounds are normal.  There is no tenderness. There is no rebound and no guarding.  Musculoskeletal: She exhibits edema.  Neurological: She is alert and oriented to person, place, and time.  Skin: Skin is warm and dry.  Well-healed scar over the anterior neck, scar over the left chest  Psychiatric: She has a normal mood and affect.    ED Course  Procedures (including critical care time)  CRITICAL CARE Performed by: Thayer Jew, F   Total critical care time: 40 min  Critical  care time was exclusive of separately billable procedures and treating other patients.  Critical care was necessary to treat or prevent imminent or life-threatening deterioration.  Critical care was time spent personally by me on the following activities: development of treatment plan with patient and/or surrogate as well as nursing, discussions with consultants, evaluation of patient's response to treatment, examination of patient, obtaining history from patient or surrogate, ordering and performing treatments and interventions, ordering and review of laboratory studies, ordering and review of radiographic studies, pulse oximetry and re-evaluation of patient's condition.  Angiocath insertion Performed by: Thayer Jew, F  Consent: Verbal consent obtained. Risks and benefits: risks, benefits and alternatives were discussed Time out: Immediately prior to procedure a "time out" was called to verify the correct patient, procedure, equipment, support staff and site/side marked as required.  Preparation: Patient was prepped and draped in the usual sterile fashion.  Vein Location: L basillic   Ultrasound Guided  Gauge: 20  Normal blood return and flush without difficulty Patient tolerance: Patient tolerated the procedure well with no immediate complications.    Labs Review Labs Reviewed  CBC WITH DIFFERENTIAL - Abnormal; Notable for the following:    MCV 104.1 (*)    MCH 35.8 (*)    RDW 15.7 (*)    All other  components within normal limits  COMPREHENSIVE METABOLIC PANEL - Abnormal; Notable for the following:    Glucose, Bld 266 (*)    Albumin 3.4 (*)    AST 72 (*)    GFR calc non Af Amer 53 (*)    GFR calc Af Amer 61 (*)    All other components within normal limits  PRO B NATRIURETIC PEPTIDE - Abnormal; Notable for the following:    Pro B Natriuretic peptide (BNP) 2458.0 (*)    All other components within normal limits  HEPARIN LEVEL (UNFRACTIONATED) - Abnormal; Notable for the following:    Heparin Unfractionated <0.10 (*)    All other components within normal limits  I-STAT ARTERIAL BLOOD GAS, ED - Abnormal; Notable for the following:    pH, Arterial 7.267 (*)    Bicarbonate 18.6 (*)    Acid-base deficit 8.0 (*)    All other components within normal limits  I-STAT CG4 LACTIC ACID, ED - Abnormal; Notable for the following:    Lactic Acid, Venous 6.10 (*)    All other components within normal limits  I-STAT ARTERIAL BLOOD GAS, ED - Abnormal; Notable for the following:    pCO2 arterial 33.6 (*)    pO2, Arterial 66.0 (*)    All other components within normal limits  CULTURE, BLOOD (SINGLE)  MRSA PCR SCREENING  TROPONIN I  APTT  HEPARIN LEVEL (UNFRACTIONATED)  CBC  TROPONIN I  TROPONIN I  TROPONIN I  BASIC METABOLIC PANEL   Imaging Review Dg Chest Portable 1 View  06/04/2013   CLINICAL DATA:  Respiratory distress  EXAM: PORTABLE CHEST - 1 VIEW  COMPARISON:  May 21, 2013  FINDINGS: There is underlying emphysematous change. There is interstitial edema bilaterally with small left effusion. There is a small area of airspace consolidation in the right upper lobe. There is cardiomegaly with mild pulmonary venous hypertension.  There is no appreciable adenopathy.  There is evidence of prior kyphoplasty procedures in the upper and mid thoracic regions.  IMPRESSION: Evidence of congestive heart failure superimposed on emphysematous change. Question mild pneumonia right upper lobe.    Electronically Signed   By: Lowella Grip M.D.   On: 06/04/2013  10:16   Dg Shoulder Left  06/04/2013   CLINICAL DATA:  Shortness of breath.  EXAM: LEFT SHOULDER - 2+ VIEW  COMPARISON:  None.  FINDINGS: Degenerative changes left shoulder. Prominent subacromial spurring. No evidence of fracture or dislocation. Prior thoracoplasties.  IMPRESSION: Degenerative changes left shoulder. Prominent subacromial spurring noted. MRI left shoulder may prove useful for further evaluation.   Electronically Signed   By: Marcello Moores  Register   On: 06/04/2013 18:24     EKG Interpretation   Date/Time:  Tuesday June 04 2013 09:57:18 EDT Ventricular Rate:  141 PR Interval:  64 QRS Duration: 94 QT Interval:  307 QTC Calculation: 470 R Axis:   -39 Text Interpretation:  Sinus tachycardia LVH with secondary repolarization  abnormality Inferior infarct, old Anterior infarct, old Confirmed by  Reika Callanan  MD, Leonard ([id number]) on 06/04/2013 10:50:20 AM      MDM   Final diagnoses:  Respiratory failure  CHF (congestive heart failure)  Healthcare-associated pneumonia  Atrial tachycardia   Patient presents in respiratory distress.  Placed on  Bipap. CXR concerning for pulmonary edema.  Hx of CHF.  Placed on nitro glycerin drip with significant improvement of symptoms.CXR also with possible PNA - will cover for HCAP.  Cardiology to evaluate and admit.    Merryl Hacker, MD 06/04/13 1946

## 2013-06-04 NOTE — ED Notes (Signed)
Lactic acid results given to Dr. Horton 

## 2013-06-04 NOTE — ED Notes (Signed)
IO removed from left upper humerus-- needle intact, bent. Dressing placed

## 2013-06-04 NOTE — H&P (Signed)
Reason for Admission: CHF Referring Physician: ER  [name] is an 63 y.o. female.  HPI:   [name] is a 63 year old woman with an ICM (s/p ICD implantation in 2005; gen change 2006; dual chamber upgrade 2014 with subsequent extraction due to infection 11/2012), EF 20-25%, mild AR, mod MR, PA pressure 77mHg(04/08/13),  CAD (last intervention 2004, last cath 2012(patent the LAD and circumflex stents), myoview 04/2013 with no ischemia), prior CVA, chronic kidney disease and atrial arrhythmias who presented on 05/21/2013 with chest pain, SOB and tachycardia, found to have atrial tachycardia. In the ED, she was started on IV diltiazem with improvement in her rate and symptoms. Potassium 4.9. Magnesium 1.7. Troponin negative. She was evaluated by Dr. ARayann Hemanwho recommended AAD therapy with amiodarone and close follow-up as an outpatient. However, prior to discharge, her rate increased and her symptoms returned; therefore, she was started on IV amiodarone and admitted to telemetry. Her rate improved on IV amiodarone and she converted to SR.  She now takes PO amiodarone and Eliquis.  The patient reports sudden onset of dyspnea at 0900hrs this morning along with nausea.  She was coughing up white foam.  She denies any weight gain recently, abd distention, LEE, fever, sick contacts since being discharged..[name] Her weight is the same as it was on 05/22/13 at last discharge.  She was transported by EMS and became unresponsive.  They attempted intubation but was unsuccessful.  Bag mask was used.  The patient currently denies vomiting, chest pain, dizziness, PND, abdominal pain, hematochezia, melena.  PT has been coming to her house.  She occasionally uses a walker.    BNP is elevated at 2458.0.  CXR shows CHF and possible RUL PNA.  Lactic acid is elevated at 6.  She was started on Aztreonam/vanc. Blood cultures drawn.     Past Medical History  Diagnosis Date  . Spinal stenosis   . Numbness of foot      left foot  . Diverticulitis     a. s/p partial colectomy 1/12 with reversal in July 2012.  . Colitis, ischemic   . DJD (degenerative joint disease)     History of multiple surgeries to the back, shoulder and knee  . Ischemic cardiomyopathy     a. Echo 06/16/10: EF 25-30%, anteroseptal and apical hypokinesis, moderate AI, mild MR.  . Chronic systolic heart failure   . CAD (coronary artery disease)     a.  Ant MI 8/03 with stenting of the LAD;  b. staged PCI with Cypher DES to OM1 8/03;  c. s/p Cypher DES to LAD 7/04;  d.  multiple cardiac caths in past (chronically abnl ECG);    e. cardiac catheterization 3/12: LAD stent patent, distal LAD 40-50%, small ostial D1 80%, ostial D2 60%, OM-1 stent patent (20-30%), proximal RCA 50%, proximal to mid RCA 40-50%; f. cath 02/17/2011 - nonobs  . LV (left ventricular) mural thrombus   . CKD (chronic kidney disease), stage III     creatinine:  1.3 in 4/12;    . Tobacco abuse     history  . GERD (gastroesophageal reflux disease)   . Hypertension   . Lower GI bleed     August 2011  . Hypothyroidism   . Aortic insufficiency   . Pseudoaneurysm     History of, right forearm  . Hyperlipidemia   . Compression fracture 07/04/11    L2  . Inappropriate shocks from ICD (implantable cardioverter-defibrillator)  2/2 atrial fibrillation with a rapid rate in the context of hypokalemia  . AICD (automatic cardioverter/defibrillator) present     Implanted for primary prevention. Device revision 2006. Repeat operation at [facility] wtih chronic smoldering infection 11/2012 s/p device extraction and subsequent LifeVest.  . PAF (paroxysmal atrial fibrillation)   . Anatomical narrow angle of right eye   . Blood transfusion     "must have been when I had the total knee replacement" (04/10/2013)  . Anemia   . H/O hiatal hernia   . Sinus bradycardia 03/05/2012  . Paroxysmal atrial flutter   . Mitral regurgitation   . Heart murmur   . CHF (congestive heart failure)      "3-4 times" (04/10/2013)  . Myocardial infarction 2002  . Stroke     History of TIA and possible CVA; hospitalized 2004; on coumadin  . Stroke     "I've had 3"; denies residual on 04/10/2013  . Chronic lower back pain     Past Surgical History  Procedure Laterality Date  . Back surgery    . Cardiac defibrillator placement  07/2003; 10/2004  . Lumbar laminectomy/decompression microdiscectomy  10/2001    L5-S1/E-chart  . Knee arthroscopy Right   . Thyroidectomy  1970's  . Total knee arthroplasty Left 11/2003  . X-stop implantation  12/2004    L3-4; L4-5  . Fixation kyphoplasty thoracic spine  07/2007    T3, 4, 6 compression fractures  . Shoulder open rotator cuff repair Right 04/2008  . Lumbar laminectomy/decompression microdiscectomy  01/2010  . Colectomy  03/2010    sigmoid left  . Colostomy  03/2010    transverse  . Peripherally inserted central catheter insertion  03/2010    removed upon discharge  . Colostomy closure  12/2010    reversal  . Tonsillectomy and adenoidectomy  1969  . Appendectomy  03/2010  . Glaucoma surgery Right   . Biv icd genertaor change out  12/17/2012    "right now I don't have a defibrillator; I've been using an external one" (04/10/2013)  . Icd lead removal Left 12/17/2012    Procedure: ICD LEAD REMOVAL;  Surgeon: Evans Lance, MD;  Location: [facility];  Service: Cardiovascular;  Laterality: Left;  . Coronary angioplasty with stent placement  10/2001; 09/2002; ?date    "I've got a total of 4" (04/10/2013)    Family History  Problem Relation Age of Onset  . Diabetes Mother   . Diabetes Brother   . Arthritis      family history  . Prostate cancer      Family History    Social History:  reports that she quit smoking about 12 years ago. Her smoking use included Cigarettes. She has a 3.3 pack-year smoking history. She has never used smokeless tobacco. She reports that she does not drink alcohol or use illicit drugs.  Allergies:  Allergies  Allergen Reactions   . Coconut Oil Anaphylaxis and Hives  . Lansoprazole Nausea Only  . Penicillins Swelling    Throat swells  . Statins Other (See Comments)    cramps  . Lisinopril     Cough- but still tolerates it  . Lactose Intolerance (Gi) Diarrhea and Other (See Comments)    Patient reports abdominal cramping and diarrhea with lactose products.     Medications: Prior to Admission medications   Medication Sig Start Date End Date Taking? Authorizing Provider  amiodarone (PACERONE) 200 MG tablet Take 200 mg (1 tablet) twice daily for one week, then take  200 mg (1 tablet) once daily for maintenance therapy 05/24/13  Yes Deboraha Sprang, MD  apixaban (ELIQUIS) 5 MG TABS tablet Take 5 mg by mouth 2 (two) times daily.   Yes Historical Provider, MD  aspirin EC 81 MG tablet Take 81 mg by mouth daily.   Yes Historical Provider, MD  carvedilol (COREG) 6.25 MG tablet Take 6.25 mg by mouth 2 (two) times daily with a meal. 06/08/12  Yes Belva Crome III, MD  diltiazem (CARDIZEM) 60 MG tablet Take 60 mg by mouth daily.   Yes Historical Provider, MD  ezetimibe (ZETIA) 10 MG tablet Take 10 mg by mouth at bedtime.    Yes Minus Breeding, MD  fexofenadine (ALLEGRA) 180 MG tablet Take 180 mg by mouth daily as needed for allergies.    Yes Historical Provider, MD  fluticasone (FLONASE) 50 MCG/ACT nasal spray Place 2 sprays into both nostrils daily as needed for allergies.    Yes Historical Provider, MD  folic acid-pyridoxine-cyancobalamin (FOLTX) 2.5-25-2 MG TABS Take 1 tablet by mouth daily.    Yes Historical Provider, MD  hyoscyamine (LEVBID) 0.375 MG 12 hr tablet Take 1 tablet (0.375 mg total) by mouth 2 (two) times daily. 05/02/13  Yes Janith Lima, MD  levothyroxine (SYNTHROID, LEVOTHROID) 200 MCG tablet Take 200 mcg by mouth daily before breakfast.    Yes Historical Provider, MD  nitroGLYCERIN (NITROSTAT) 0.4 MG SL tablet Place 0.4 mg under the tongue every 5 (five) minutes as needed for chest pain.    Yes Historical  Provider, MD  omega-3 acid ethyl esters (LOVAZA) 1 G capsule Take 1 g by mouth daily.   Yes Historical Provider, MD  pantoprazole (PROTONIX) 40 MG tablet Take 40 mg by mouth 2 (two) times daily as needed (gerd).    Yes Historical Provider, MD  polyethylene glycol (MIRALAX / GLYCOLAX) packet Take 17 g by mouth daily as needed (for constipation).    Yes Historical Provider, MD  raloxifene (EVISTA) 60 MG tablet Take 60 mg by mouth daily.   Yes Historical Provider, MD  spironolactone (ALDACTONE) 25 MG tablet Take 25 mg by mouth daily.   Yes Historical Provider, MD     Results for orders placed during the hospital encounter of 06/04/13 (from the past 48 hour(s))  CBC WITH DIFFERENTIAL     Status: Abnormal   Collection Time    06/04/13  9:57 AM      Result Value Ref Range   WBC 6.5  4.0 - 10.5 K/uL   RBC 4.11  3.87 - 5.11 MIL/uL   Hemoglobin 14.7  12.0 - 15.0 g/dL   HCT 42.8  36.0 - 46.0 %   MCV 104.1 (*) 78.0 - 100.0 fL   MCH 35.8 (*) 26.0 - 34.0 pg   MCHC 34.3  30.0 - 36.0 g/dL   RDW 15.7 (*) 11.5 - 15.5 %   Platelets 215  150 - 400 K/uL   Neutrophils Relative % 49  43 - 77 %   Neutro Abs 3.2  1.7 - 7.7 K/uL   Lymphocytes Relative 41  12 - 46 %   Lymphs Abs 2.6  0.7 - 4.0 K/uL   Monocytes Relative 8  3 - 12 %   Monocytes Absolute 0.5  0.1 - 1.0 K/uL   Eosinophils Relative 1  0 - 5 %   Eosinophils Absolute 0.1  0.0 - 0.7 K/uL   Basophils Relative 1  0 - 1 %   Basophils Absolute 0.0  0.0 - 0.1 K/uL  COMPREHENSIVE METABOLIC PANEL     Status: Abnormal   Collection Time    06/04/13  9:57 AM      Result Value Ref Range   Sodium 142  137 - 147 mEq/L   Potassium 4.4  3.7 - 5.3 mEq/L   Comment: HEMOLYSIS AT THIS LEVEL MAY AFFECT RESULT   Chloride 102  96 - 112 mEq/L   CO2 19  19 - 32 mEq/L   Glucose, Bld 266 (*) 70 - 99 mg/dL   BUN 15  6 - 23 mg/dL   Creatinine, Ser 1.09  0.50 - 1.10 mg/dL   Calcium 8.8  8.4 - 10.5 mg/dL   Total Protein 7.4  6.0 - 8.3 g/dL   Albumin 3.4 (*) 3.5 -  5.2 g/dL   AST 72 (*) 0 - 37 U/L   Comment: HEMOLYSIS AT THIS LEVEL MAY AFFECT RESULT   ALT 27  0 - 35 U/L   Comment: HEMOLYSIS AT THIS LEVEL MAY AFFECT RESULT   Alkaline Phosphatase 60  39 - 117 U/L   Total Bilirubin 0.5  0.3 - 1.2 mg/dL   GFR calc non Af Amer 53 (*) >90 mL/min   GFR calc Af Amer 61 (*) >90 mL/min   Comment: (NOTE)     The eGFR has been calculated using the CKD EPI equation.     This calculation has not been validated in all clinical situations.     eGFR's persistently <90 mL/min signify possible Chronic Kidney     Disease.  PRO B NATRIURETIC PEPTIDE     Status: Abnormal   Collection Time    06/04/13  9:57 AM      Result Value Ref Range   Pro B Natriuretic peptide (BNP) 2458.0 (*) 0 - 125 pg/mL  TROPONIN I     Status: None   Collection Time    06/04/13  9:57 AM      Result Value Ref Range   Troponin I <0.30  <0.30 ng/mL   Comment:            Due to the release kinetics of cTnI,     a negative result within the first hours     of the onset of symptoms does not rule out     myocardial infarction with certainty.     If myocardial infarction is still suspected,     repeat the test at appropriate intervals.  I-STAT ARTERIAL BLOOD GAS, ED     Status: Abnormal   Collection Time    06/04/13 10:28 AM      Result Value Ref Range   pH, Arterial 7.267 (*) 7.350 - 7.450   pCO2 arterial 40.7  35.0 - 45.0 mmHg   pO2, Arterial 90.0  80.0 - 100.0 mmHg   Bicarbonate 18.6 (*) 20.0 - 24.0 mEq/L   TCO2 20  0 - 100 mmol/L   O2 Saturation 96.0     Acid-base deficit 8.0 (*) 0.0 - 2.0 mmol/L   Patient temperature 98.6 F     Collection site RADIAL, ALLEN'S TEST ACCEPTABLE     Drawn by RT     Sample type ARTERIAL    I-STAT CG4 LACTIC ACID, ED     Status: Abnormal   Collection Time    06/04/13 10:42 AM      Result Value Ref Range   Lactic Acid, Venous 6.10 (*) 0.5 - 2.2 mmol/L    Dg Chest Portable 1 View  06/04/2013  CLINICAL DATA:  Respiratory distress  EXAM: PORTABLE  CHEST - 1 VIEW  COMPARISON:  May 21, 2013  FINDINGS: There is underlying emphysematous change. There is interstitial edema bilaterally with small left effusion. There is a small area of airspace consolidation in the right upper lobe. There is cardiomegaly with mild pulmonary venous hypertension.  There is no appreciable adenopathy.  There is evidence of prior kyphoplasty procedures in the upper and mid thoracic regions.  IMPRESSION: Evidence of congestive heart failure superimposed on emphysematous change. Question mild pneumonia right upper lobe.   Electronically Signed   By: Lowella Grip M.D.   On: 06/04/2013 10:16    Review of Systems  Constitutional: Negative for fever and diaphoresis.  HENT: Positive for congestion.   Respiratory: Positive for cough and shortness of breath. Negative for wheezing.        Coughing up white foam.  Cardiovascular: Positive for orthopnea (She sleeps on several pillows at baseline.). Negative for chest pain, palpitations, leg swelling and PND.  Gastrointestinal: Positive for nausea and abdominal pain (Chronic). Negative for vomiting, blood in stool and melena.  Genitourinary: Negative for hematuria.  Musculoskeletal: Positive for myalgias (Left shoulder from IO IV).  Neurological: Negative for dizziness.  All other systems reviewed and are negative.   Blood pressure 112/59, pulse 104, resp. rate 23, height 5' 6" (1.676 m), weight 145 lb 8.1 oz (66.001 kg), SpO2 100.00%. Physical Exam  Nursing note and vitals reviewed. Constitutional: She is oriented to person, place, and time. She appears well-developed and well-nourished.  HENT:  Head: Normocephalic and atraumatic.  Eyes: EOM are normal. Pupils are equal, round, and reactive to light. No scleral icterus.  Neck: Normal range of motion. Neck supple. JVP 7-8  Positive hepatojugular reflux  Cardiovascular: S1 normal and S2 normal.  An irregularly irregular rhythm present. Tachycardia present.  +s3 No  murmur heard. Pulses:      Radial pulses are 2+ on the right side, and 2+ on the left side.       Dorsalis pedis pulses are 2+ on the right side, and 2+ on the left side.  No Carotid bruit  Respiratory: Effort normal. She has rales.  Right sided crackles.  GI: Soft. Bowel sounds are normal. There is tenderness (Diffusely.  Baseline).  Musculoskeletal: She exhibits no edema.  Neurological: She is alert and oriented to person, place, and time. She exhibits normal muscle tone.  Skin: Skin is warm and dry.  Psychiatric: She has a normal mood and affect.    Assessment/Plan: Principal Problem:   Acute respiratory failure Active Problems:   HYPERLIPIDEMIA   Ischemic cardiomyopathy   CKD (chronic kidney disease)   CAD (coronary artery disease)   HTN (hypertension)   PAF (paroxysmal atrial fibrillation) with RVR   Hypoxia   Acute on chronic systolic HF (heart failure)   Left shoulder pain- SP IO IV insertion.  Plan:  Admit to stepdown. Cycle cardiac markers.  IV lasix 60m x1 and reassess.  Stop eliquis and start IV heparin.  Possible right heart cath.  Rechecking blood gas.  HTarri Fuller3/17/2015, 4:48 PM    Patient seen and examined with BTarri Fuller PA-C. We discussed all aspects of the encounter. I agree with the assessment and plan as stated above.   63y/o woman with severe iCM, recent atrial tachycardia presents with acute onset of respiratory distress and HF with lactic acidosis. Initial ECG looks like sinus tach but can't exclude recurrent atrial tach. She is now in  AF with RVR. CXR shows pulmonary edema. Lactate was 6. She does not appear markedly volume overloaded. My suspicion is that she has low output/cardiogenic shock physiology in setting of recurrent tachycardia. She now seems improved. Will admit to SDU. Repeat ABG. Given 1 dose lasix. Start IV amio. Cycle cardiac markers. Would change Eliquis to heparin as she may need RHC. No evidence of PNA so can stop abx.   Daniel  Bensimhon,MD 5:05 PM

## 2013-06-04 NOTE — Progress Notes (Signed)
ANTICOAGULATION CONSULT NOTE - Initial Consult  Pharmacy Consult for heparin  Indication: atrial fibrillation  Allergies  Allergen Reactions  . Coconut Oil Anaphylaxis and Hives  . Lansoprazole Nausea Only  . Penicillins Swelling    Throat swells  . Statins Other (See Comments)    cramps  . Lisinopril     Cough- but still tolerates it  . Lactose Intolerance (Gi) Diarrhea and Other (See Comments)    Patient reports abdominal cramping and diarrhea with lactose products.     Patient Measurements: Height: 5\' 6"  (167.6 cm) Weight: 145 lb 8.1 oz (66.001 kg) IBW/kg (Calculated) : 59.3 Heparin Dosing Weight: 66kg  Vital Signs: BP: 116/75 mmHg (03/17 1706) Pulse Rate: 106 (03/17 1706)  Labs:  Recent Labs  06/04/13 0957  HGB 14.7  HCT 42.8  PLT 215  CREATININE 1.09  TROPONINI <0.30    Estimated Creatinine Clearance: 49.5 ml/min (by C-G formula based on Cr of 1.09).   Medical History: Past Medical History  Diagnosis Date  . Spinal stenosis   . Numbness of foot     left foot  . Diverticulitis     a. s/p partial colectomy 1/12 with reversal in July 2012.  . Colitis, ischemic   . DJD (degenerative joint disease)     History of multiple surgeries to the back, shoulder and knee  . Ischemic cardiomyopathy     a. Echo 06/16/10: EF 25-30%, anteroseptal and apical hypokinesis, moderate AI, mild MR.  . Chronic systolic heart failure   . CAD (coronary artery disease)     a.  Ant MI 8/03 with stenting of the LAD;  b. staged PCI with Cypher DES to OM1 8/03;  c. s/p Cypher DES to LAD 7/04;  d.  multiple cardiac caths in past (chronically abnl ECG);    e. cardiac catheterization 3/12: LAD stent patent, distal LAD 40-50%, small ostial D1 80%, ostial D2 60%, OM-1 stent patent (20-30%), proximal RCA 50%, proximal to mid RCA 40-50%; f. cath 02/17/2011 - nonobs  . LV (left ventricular) mural thrombus   . CKD (chronic kidney disease), stage III     creatinine:  1.3 in 4/12;    . Tobacco  abuse     history  . GERD (gastroesophageal reflux disease)   . Hypertension   . Lower GI bleed     August 2011  . Hypothyroidism   . Aortic insufficiency   . Pseudoaneurysm     History of, right forearm  . Hyperlipidemia   . Compression fracture 07/04/11    L2  . 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 AICD (automatic cardioverter/defibrillator) present     Implanted for primary prevention. Device revision 2006. 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  . PAF (paroxysmal atrial fibrillation)   . Anatomical narrow angle of right eye   . Blood transfusion     "must have been when I had the total knee replacement" (04/10/2013)  . Anemia   . H/O hiatal hernia   . Sinus bradycardia 03/05/2012  . Paroxysmal atrial flutter   . Mitral regurgitation   . Heart murmur   . CHF (congestive heart failure)     "3-4 times" (04/10/2013)  . Myocardial infarction 2002  . Stroke     History of TIA and possible CVA; hospitalized 2004; on coumadin  . Stroke     "  I've had 3"; denies residual on 04/10/2013  . Chronic lower back pain     Medications:  Infusions:  . aztreonam    . diltiazem    . heparin    . vancomycin      Assessment: 66 yof presented to the ED with respiratory distress. She is on chronic apixaban for anticoagulation. Confirmed with patient that her last dose was 3/16 PM. H/H 14.7/42.8, plts 215. CrCl >27ml/min and no bleeding noted.   Goal of Therapy:  Heparin level 0.3-0.7 units/ml Monitor platelets by anticoagulation protocol: Yes   Plan:  1. Check baseline heparin level and aPTT 2. Heparin gtt 900 units/hr 3. Check an 8 hour heparin level and aPTT 4. F/u ability to restart apixaban  Maverik Foot, Rande Lawman 06/04/2013,5:29 PM

## 2013-06-04 NOTE — ED Notes (Signed)
Pt brother -- YOUNG ROCHELLE -- [phone] notified-- returned call.

## 2013-06-04 NOTE — ED Notes (Signed)
Pt. taken off Bipap for trial, placed on 4lpm n/c, tolerating well, RT to monitor.

## 2013-06-04 NOTE — ED Notes (Addendum)
To ED via [facility]--- from home with resp distress--- EMS attempted to nasally intubate pt without success after pt became unresponsive. On arrival, pt is A/O--  pt has nasal trumpet in right nares-- removed on arrival. Blood noted at nares. IO in left humerus (45) with NS infusing on pressure bag. Placed on BiPap on arrival per RT.  Pt received Lidocaine 50mg  after IO, albuterol neb, C-Pap per EMS prior to nasal trumpet and bagging.

## 2013-06-04 NOTE — ED Notes (Addendum)
Pt. arrived T-C being bagged by [facility] with nasal airway in place, attempted Nasal Intubation in field due to respiratory distress. Placed on Servo-I initially @18 /8 NIV/PC mode/50%/BUR-8(32pt.)/Vt's-600. Transisted to V-60 BiPAP@11 :00 for anticipated transport, RT to monitor.

## 2013-06-04 NOTE — Progress Notes (Signed)
ANTIBIOTIC CONSULT NOTE - INITIAL  Pharmacy Consult for vancomycin and aztreonam Indication: HCAP  Allergies  Allergen Reactions  . Coconut Oil Anaphylaxis and Hives  . Lansoprazole Nausea Only  . Penicillins Swelling    Throat swells  . Statins Other (See Comments)    cramps  . Lisinopril     Cough- but still tolerates it  . Lactose Intolerance (Gi) Diarrhea and Other (See Comments)    Patient reports abdominal cramping and diarrhea with lactose products.     Patient Measurements: Height: 5\' 6"  (167.6 cm) Weight: 145 lb 8.1 oz (66.001 kg) IBW/kg (Calculated) : 59.3  Vital Signs: BP: 153/110 mmHg (03/17 1001) Pulse Rate: 140 (03/17 1001) Intake/Output from previous day:   Intake/Output from this shift:    Labs: No results found for this basename: WBC, HGB, PLT, LABCREA, CREATININE,  in the last 72 hours Estimated Creatinine Clearance: 44.9 ml/min (by C-G formula based on Cr of 1.2). No results found for this basename: VANCOTROUGH, VANCOPEAK, VANCORANDOM, GENTTROUGH, GENTPEAK, GENTRANDOM, TOBRATROUGH, TOBRAPEAK, TOBRARND, AMIKACINPEAK, AMIKACINTROU, AMIKACIN,  in the last 72 hours   Microbiology: No results found for this or any previous visit (from the past 720 hour(s)).  Medical History: Past Medical History  Diagnosis Date  . Spinal stenosis   . Numbness of foot     left foot  . Diverticulitis     a. s/p partial colectomy 1/12 with reversal in July 2012.  . Colitis, ischemic   . DJD (degenerative joint disease)     History of multiple surgeries to the back, shoulder and knee  . Ischemic cardiomyopathy     a. Echo 06/16/10: EF 25-30%, anteroseptal and apical hypokinesis, moderate AI, mild MR.  . Chronic systolic heart failure   . CAD (coronary artery disease)     a.  Ant MI 8/03 with stenting of the LAD;  b. staged PCI with Cypher DES to OM1 8/03;  c. s/p Cypher DES to LAD 7/04;  d.  multiple cardiac caths in past (chronically abnl ECG);    e. cardiac  catheterization 3/12: LAD stent patent, distal LAD 40-50%, small ostial D1 80%, ostial D2 60%, OM-1 stent patent (20-30%), proximal RCA 50%, proximal to mid RCA 40-50%; f. cath 02/17/2011 - nonobs  . LV (left ventricular) mural thrombus   . CKD (chronic kidney disease), stage III     creatinine:  1.3 in 4/12;    . Tobacco abuse     history  . GERD (gastroesophageal reflux disease)   . Hypertension   . Lower GI bleed     August 2011  . Hypothyroidism   . Aortic insufficiency   . Pseudoaneurysm     History of, right forearm  . Hyperlipidemia   . Compression fracture 07/04/11    L2  . 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 AICD (automatic cardioverter/defibrillator) present     Implanted for primary prevention. Device revision 2006. 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  . PAF (paroxysmal atrial fibrillation)   . Anatomical narrow angle of right eye   . Blood transfusion     "must have been when I had the total knee replacement" (04/10/2013)  . Anemia   . H/O hiatal hernia   . Sinus bradycardia 03/05/2012  . Paroxysmal atrial flutter   . Mitral regurgitation   . Heart murmur   .  CHF (congestive heart failure)     "3-4 times" (04/10/2013)  . Myocardial infarction 2002  . Stroke     History of TIA and possible CVA; hospitalized 2004; on coumadin  . Stroke     "I've had 3"; denies residual on 04/10/2013  . Chronic lower back pain     Assessment: 62 YOF transferred via EMS with respiratory distress. Blood gas reveals she is acidotic. No other labs have resulted yet from the ED. SCr 1.2 on 3/9- this gives an estimated CrCl of ~42mL/min. Blood cultures have been sent.  With allergy to penicillins- Vancomycin 1g IV x1 and aztreonam 2g IV x1 have been ordered by EDP.  Goal of Therapy:  Vancomycin  trough level 15-20 mcg/ml  Plan:  1. Aztreonam 1g IV q8h starting at 1600 2. Vancomycin 500mg  IV q12h starting at 2300 3. Follow up renal function, clinical progression, LOT (anticipate 8 days), trough at [place] D. Cayley Pester, PharmD, BCPS Clinical Pharmacist Pager: [phone] 06/04/2013 10:34 AM

## 2013-06-05 ENCOUNTER — Encounter: Payer: Self-pay | Admitting: Internal Medicine

## 2013-06-05 DIAGNOSIS — Z7901 Long term (current) use of anticoagulants: Secondary | ICD-10-CM

## 2013-06-05 DIAGNOSIS — I509 Heart failure, unspecified: Secondary | ICD-10-CM

## 2013-06-05 DIAGNOSIS — I10 Essential (primary) hypertension: Secondary | ICD-10-CM

## 2013-06-05 DIAGNOSIS — I2589 Other forms of chronic ischemic heart disease: Secondary | ICD-10-CM

## 2013-06-05 DIAGNOSIS — I5043 Acute on chronic combined systolic (congestive) and diastolic (congestive) heart failure: Principal | ICD-10-CM

## 2013-06-05 DIAGNOSIS — I4891 Unspecified atrial fibrillation: Secondary | ICD-10-CM

## 2013-06-05 DIAGNOSIS — I499 Cardiac arrhythmia, unspecified: Secondary | ICD-10-CM

## 2013-06-05 DIAGNOSIS — I5022 Chronic systolic (congestive) heart failure: Secondary | ICD-10-CM

## 2013-06-05 LAB — BASIC METABOLIC PANEL
BUN: 18 mg/dL (ref 6–23)
CHLORIDE: 99 meq/L (ref 96–112)
CO2: 22 mEq/L (ref 19–32)
Calcium: 8.5 mg/dL (ref 8.4–10.5)
Creatinine, Ser: 1.11 mg/dL — ABNORMAL HIGH (ref 0.50–1.10)
GFR calc Af Amer: 60 mL/min — ABNORMAL LOW (ref >90)
GFR, EST NON AFRICAN AMERICAN: 52 mL/min — AB (ref >90)
GLUCOSE: 126 mg/dL — AB (ref 70–99)
POTASSIUM: 4.3 meq/L (ref 3.7–5.3)
SODIUM: 139 meq/L (ref 137–147)

## 2013-06-05 LAB — CBC
HCT: 36.7 % (ref 36.0–46.0)
HEMOGLOBIN: 12.9 g/dL (ref 12.0–15.0)
MCH: 35.7 pg — AB (ref 26.0–34.0)
MCHC: 35.1 g/dL (ref 30.0–36.0)
MCV: 101.7 fL — ABNORMAL HIGH (ref 78.0–100.0)
Platelets: 143 10*3/uL — ABNORMAL LOW (ref 150–400)
RBC: 3.61 MIL/uL — ABNORMAL LOW (ref 3.87–5.11)
RDW: 15.5 % (ref 11.5–15.5)
WBC: 5.3 10*3/uL (ref 4.0–10.5)

## 2013-06-05 LAB — HEPARIN LEVEL (UNFRACTIONATED)
HEPARIN UNFRACTIONATED: 0.66 [IU]/mL (ref 0.30–0.70)
Heparin Unfractionated: 0.66 IU/mL (ref 0.30–0.70)
Heparin Unfractionated: 0.8 IU/mL — ABNORMAL HIGH (ref 0.30–0.70)
Heparin Unfractionated: 0.79 IU/mL — ABNORMAL HIGH (ref 0.30–0.70)

## 2013-06-05 LAB — TROPONIN I
Troponin I: <0.3 ng/mL (ref <0.30)
Troponin I: <0.3 ng/mL (ref <0.30)

## 2013-06-05 LAB — PROTIME-INR
INR: 2.31 — AB (ref 0.00–1.49)
PROTHROMBIN TIME: 24.6 s — AB (ref 11.6–15.2)

## 2013-06-05 MED ORDER — FUROSEMIDE 10 MG/ML IJ SOLN
40.0000 mg | Freq: Two times a day (BID) | INTRAMUSCULAR | Status: DC
  Administered 2013-06-05: 40 mg via INTRAVENOUS
  Filled 2013-06-05 (×3): qty 4

## 2013-06-05 NOTE — Progress Notes (Signed)
EKG CRITICAL VALUE     12 lead EKG performed.  Critical value noted.  Kevan Ny, RN notified.   Laurell Josephs, [place] 06/05/2013 12:44 PM

## 2013-06-05 NOTE — Progress Notes (Signed)
06/05/13  Pharmacy-  Heparin 1655  Heparin level 0.66 on 800 units/hr  A/P:  63yo female with AFib.  Heparin level therapeutic on current rate.  Spoke with RN, pt has had some bleeding at site of attempted line placement.  RN has placed a pressure dressing and has had no further issues with this.  1-  Continue current rate 2-  Repeat heparin level at 2130 (6hr) to verify therapeutic x 2.  Gracy Bruins, PharmD Clinical Pharmacist [facility]

## 2013-06-05 NOTE — Progress Notes (Signed)
ANTICOAGULATION CONSULT NOTE - Follow Up Consult  Pharmacy Consult for heparin Indication: atrial fibrillation  Labs:  Recent Labs  06/04/13 0957  06/04/13 1750 06/04/13 2020 06/05/13 0124 06/05/13 0201 06/05/13 0650 06/05/13 2207  HGB 14.7  --   --   --  12.9  --   --   --   HCT 42.8  --   --   --  36.7  --   --   --   PLT 215  --   --   --  143*  --   --   --   APTT  --   --  26  --   --   --   --   --   LABPROT  --   --   --   --   --   --  24.6*  --   INR  --   --   --   --   --   --  2.31*  --   HEPARINUNFRC  --   < > <0.10*  --  0.66 0.66 0.80* 0.79*  CREATININE 1.09  --   --   --  1.11*  --   --   --   TROPONINI <0.30  --   --  <0.30 <0.30  --  <0.30  --   < > = values in this interval not displayed.  Assessment: 63yo female on IV heparin for afib.  Heparin rate at 800 units/hr.  Repeat heparin level is 0.79 which is above desired goal range.  Goal of Therapy:  Heparin level 0.3-0.7 units/ml   Plan:  Will decrease heparin gtt by to 700 units/hr and check level in AM.  Rober Minion, PharmD., MS Clinical Pharmacist Pager:  [phone] Thank you for allowing pharmacy to be part of this patients care team. 06/05/2013,10:38 PM

## 2013-06-05 NOTE — Progress Notes (Signed)
Notified [place] Cardiologist of pt's abnormal EKG. Dr. Ellyn Hack here to see pt.

## 2013-06-05 NOTE — Progress Notes (Signed)
Utilization Review Completed.  

## 2013-06-05 NOTE — Progress Notes (Addendum)
ANTICOAGULATION CONSULT NOTE - Follow Up Consult  Pharmacy Consult for heparin Indication: atrial fibrillation  Labs:  Recent Labs  06/04/13 0957 06/04/13 1750 06/04/13 2020 06/05/13 0124  HGB 14.7  --   --  12.9  HCT 42.8  --   --  36.7  PLT 215  --   --  143*  APTT  --  26  --   --   HEPARINUNFRC  --  <0.10*  --  0.66  CREATININE 1.09  --   --  1.11*  TROPONINI <0.30  --  <0.30 <0.30    Assessment/Plan:  63yo female now therapeutic on heparin with initial dosing while apixaban on hold. Will continue gtt at current rate and confirm stable with additional level.   Wynona Neat, PharmD, BCPS  06/05/2013,2:44 AM

## 2013-06-05 NOTE — Consult Note (Addendum)
ELECTROPHYSIOLOGY CONSULT NOTE   Patient ID: [place] MRN: [id number], DOB/AGE: Jun 22, 1950   Admit date: 06/04/2013 Date of Consult: 06/05/2013  Primary Physician: [name], MD Primary Cardiologist: Daneen Schick, MD Primary EP: Jolyn Nap, MD Reason for Consultation: Tachycardia  History of Present Illness [name] is a 63 y.o. female with an ICM (s/p ICD implantation in 2005; gen change 2006; dual chamber upgrade 2014 with subsequent extraction due to infection 11/2012), EF 20-25%, mild AR, mod MR, PA pressure 45 mmHg (04/08/13), CAD (last intervention 2004, last cath 2012 (patent LAD and circumflex stents), Myoview stress test Feb 2015 with no ischemia, prior CVA, chronic kidney disease and atrial arrhythmias who presented on 05/21/2013 with chest pain, SOB and tachycardia, found to have incessant atrial tachycardia. In the ED, she was started on IV diltiazem with improvement in her rate and symptoms. Potassium 4.9. Magnesium 1.7. Troponin negative. She was evaluated by Dr. Rayann Heman who recommended AAD therapy with amiodarone and close follow-up as an outpatient. However, prior to discharge from the ED, her rate increased and her symptoms returned; therefore, she was admitted for IV amiodarone loading. Her rate improved on IV amiodarone and she converted to SR. She was discharged 05/23/2013 on PO amiodarone and Eliquis. On 06/04/2013 she developed sudden onset worsening SOB. She had productive cough. She denies CP or palpitations. She denies LE swelling, orthopnea, PND or weight gain. On admission, BNP elevated at 2458. CXR showed pulmonary edema and possible RUL PNA. Lactic acid is elevated at 6. She was started on Aztreonam and Vancomycin. Blood cultures were drawn. She was also tachycardic on presentation. ECG shows an atrial tachycardia at 140 bpm. She has low output / cardiogenic shock physiology in setting of recurrent tachycardia. EP has been asked to evaluate for treatment options  regarding recurrent atrial tachycardia.  Past Medical History Past Medical History  Diagnosis Date  . Spinal stenosis   . Numbness of foot     left foot  . Diverticulitis     a. s/p partial colectomy 1/12 with reversal in July 2012.  . Colitis, ischemic   . DJD (degenerative joint disease)     History of multiple surgeries to the back, shoulder and knee  . Ischemic cardiomyopathy     a. Echo 06/16/10: EF 25-30%, anteroseptal and apical hypokinesis, moderate AI, mild MR.  . Chronic systolic heart failure   . CAD (coronary artery disease)     a.  Ant MI 8/03 with stenting of the LAD;  b. staged PCI with Cypher DES to OM1 8/03;  c. s/p Cypher DES to LAD 7/04;  d.  multiple cardiac caths in past (chronically abnl ECG);    e. cardiac catheterization 3/12: LAD stent patent, distal LAD 40-50%, small ostial D1 80%, ostial D2 60%, OM-1 stent patent (20-30%), proximal RCA 50%, proximal to mid RCA 40-50%; f. cath 02/17/2011 - nonobs  . LV (left ventricular) mural thrombus   . CKD (chronic kidney disease), stage III     creatinine:  1.3 in 4/12;    . Tobacco abuse     history  . GERD (gastroesophageal reflux disease)   . Hypertension   . Lower GI bleed     August 2011  . Hypothyroidism   . Aortic insufficiency   . Pseudoaneurysm     History of, right forearm  . Hyperlipidemia   . Compression fracture 07/04/11    L2  . Inappropriate shocks from ICD (implantable cardioverter-defibrillator)  2/2 atrial fibrillation with a rapid rate in the context of hypokalemia  . PAF (paroxysmal atrial fibrillation)   . Anatomical narrow angle of right eye   . Blood transfusion     "must have been when I had the total knee replacement" (04/10/2013)  . Anemia   . H/O hiatal hernia   . Sinus bradycardia 03/05/2012  . Paroxysmal atrial flutter   . Mitral regurgitation   . Heart murmur   . CHF (congestive heart failure)     "3-4 times" (04/10/2013)  . Myocardial infarction 2002  . Stroke     History  of TIA and possible CVA; hospitalized 2004; on coumadin  . Stroke     "I've had 3"; denies residual on 04/10/2013  . Chronic lower back pain     Past Surgical History Past Surgical History  Procedure Laterality Date  . Back surgery    . Cardiac defibrillator placement  07/2003; 10/2004; 2014  . Lumbar laminectomy/decompression microdiscectomy  10/2001    L5-S1/E-chart  . Knee arthroscopy Right   . Thyroidectomy  1970's  . Total knee arthroplasty Left 11/2003  . X-stop implantation  12/2004    L3-4; L4-5  . Fixation kyphoplasty thoracic spine  07/2007    T3, 4, 6 compression fractures  . Shoulder open rotator cuff repair Right 04/2008  . Lumbar laminectomy/decompression microdiscectomy  01/2010  . Colectomy  03/2010    sigmoid left; transverse  . Peripherally inserted central catheter insertion  03/2010    removed upon discharge  . Colostomy reversal  12/2010  . Tonsillectomy and adenoidectomy  1969  . Appendectomy  03/2010  . Glaucoma surgery Right   . Biv icd genertaor change out  12/17/2012    "right now I don't have a defibrillator; I've been using an external one" (06/04/2013)  . Icd lead removal Left 12/17/2012    Procedure: ICD LEAD REMOVAL;  Surgeon: Evans Lance, MD;  Location: [place];  Service: Cardiovascular;  Laterality: Left;  . Coronary angioplasty with stent placement  10/2001; 09/2002; ?date    "I've got a total of 4" (04/10/2013)    Allergies/Intolerances Allergies  Allergen Reactions  . Coconut Oil Anaphylaxis and Hives  . Lansoprazole Nausea Only  . Penicillins Swelling    Throat swells  . Statins Other (See Comments)    cramps  . Lisinopril     Cough- but still tolerates it  . Lactose Intolerance (Gi) Diarrhea and Other (See Comments)    Patient reports abdominal cramping and diarrhea with lactose products.     Current Home Medications      amiodarone 200 MG tablet  Commonly known as:  PACERONE  Take 200 mg (1 tablet) twice daily for one week, then take 200  mg (1 tablet) once daily for maintenance therapy     aspirin EC 81 MG tablet  Take 81 mg by mouth daily.     carvedilol 6.25 MG tablet  Commonly known as:  COREG  Take 6.25 mg by mouth 2 (two) times daily with a meal.     diltiazem 60 MG tablet  Commonly known as:  CARDIZEM  Take 60 mg by mouth daily.     ELIQUIS 5 MG Tabs tablet  Generic drug:  apixaban  Take 5 mg by mouth 2 (two) times daily.     ezetimibe 10 MG tablet  Commonly known as:  ZETIA  Take 10 mg by mouth at bedtime.     fexofenadine 180 MG tablet  Commonly  known as:  ALLEGRA  Take 180 mg by mouth daily as needed for allergies.     fluticasone 50 MCG/ACT nasal spray  Commonly known as:  FLONASE  Place 2 sprays into both nostrils daily as needed for allergies.     folic acid-pyridoxine-cyancobalamin 2.5-25-2 MG Tabs  Commonly known as:  FOLTX  Take 1 tablet by mouth daily.     hyoscyamine 0.375 MG 12 hr tablet  Commonly known as:  LEVBID  Take 1 tablet (0.375 mg total) by mouth 2 (two) times daily.     levothyroxine 200 MCG tablet  Commonly known as:  SYNTHROID, LEVOTHROID  Take 200 mcg by mouth daily before breakfast.     nitroGLYCERIN 0.4 MG SL tablet  Commonly known as:  NITROSTAT  Place 0.4 mg under the tongue every 5 (five) minutes as needed for chest pain.     omega-3 acid ethyl esters 1 G capsule  Commonly known as:  LOVAZA  Take 1 g by mouth daily.     pantoprazole 40 MG tablet  Commonly known as:  PROTONIX  Take 40 mg by mouth 2 (two) times daily as needed (gerd).     polyethylene glycol packet  Commonly known as:  MIRALAX / GLYCOLAX  Take 17 g by mouth daily as needed (for constipation).     raloxifene 60 MG tablet  Commonly known as:  EVISTA  Take 60 mg by mouth daily.     spironolactone 25 MG tablet  Commonly known as:  ALDACTONE  Take 25 mg by mouth daily.     Inpatient Medications . aspirin EC  81 mg Oral Daily  . aztreonam  1 g Intravenous 3 times per day  . carvedilol   6.25 mg Oral BID WC  . diltiazem  15 mg Intravenous Once  . ezetimibe  10 mg Oral QHS  . folic acid-pyridoxine-cyancobalamin  1 tablet Oral Daily  . furosemide  40 mg Intravenous BID  . hyoscyamine  0.375 mg Oral BID  . levothyroxine  200 mcg Oral QAC breakfast  . omega-3 acid ethyl esters  1 g Oral Daily  . raloxifene  60 mg Oral Daily  . spironolactone  25 mg Oral Daily  . vancomycin  500 mg Intravenous Q12H   . amiodarone (NEXTERONE PREMIX) 360 mg/200 mL dextrose 30 mg/hr (06/05/13 1044)  . heparin 800 Units/hr (06/05/13 1610)    Family History Family History  Problem Relation Age of Onset  . Diabetes Mother   . Diabetes Brother   . Arthritis      family history  . Prostate cancer      Family History     Social History History   Social History  . Marital Status: Widowed    Spouse Name: N/A    Number of Children: N/A  . Years of Education: N/A   Occupational History  . Retired    Social History Main Topics  . Smoking status: Former Smoker -- 0.10 packs/day for 33 years    Types: Cigarettes    Quit date: 04/21/2001  . Smokeless tobacco: Never Used     Comment: "smoked ~ 1 pack/month"  . Alcohol Use: 0.6 oz/week    1 Glasses of wine per week  . Drug Use: No  . Sexual Activity: Not Currently   Other Topics Concern  . Not on file   Social History Narrative   Lives alone, has a house, has some household help. Handicapped upfitted.     Review of Systems  General: No chills, fever, night sweats or weight changes  Cardiovascular:  No chest pain, dyspnea on exertion, edema, orthopnea, palpitations, paroxysmal nocturnal dyspnea Dermatological: No rash, lesions or masses Respiratory: No cough, dyspnea Urologic: No hematuria, dysuria Abdominal: No nausea, vomiting, diarrhea, bright red blood per rectum, melena, or hematemesis Neurologic: No visual changes, weakness, changes in mental status All other systems reviewed and are otherwise negative except as noted  above.  Physical Exam Vitals: Blood pressure 103/70, pulse 76, temperature 98 F (36.7 C), temperature source Oral, resp. rate 35, height 5\' 6"  (1.676 m), weight 147 lb 11.3 oz (67 kg), SpO2 100.00%.  General: Well developed, well appearing 63 y.o. female in no acute distress. HEENT: Normocephalic, atraumatic. EOMs intact. Sclera nonicteric. Oropharynx clear.  Neck: Supple without bruits. No JVD. Lungs: Respirations regular and unlabored, CTA bilaterally. No wheezes, rales or rhonchi. Heart: RRR. S1, S2 present. No murmurs, rub, S3 or S4. Abdomen: Soft, non-tender, non-distended. BS present x 4 quadrants. No hepatosplenomegaly.  Extremities: No clubbing, cyanosis or edema. DP/PT/Radials 2+ and equal bilaterally. Psych: Normal affect. Neuro: Alert and oriented X 3. Moves all extremities spontaneously. Musculoskeletal: No kyphosis. Skin: Intact. Warm and dry. No rashes or petechiae in exposed areas.   Labs  Recent Labs  06/04/13 0957 06/04/13 2020 06/05/13 0124 06/05/13 0650  TROPONINI <0.30 <0.30 <0.30 <0.30   Lab Results  Component Value Date   WBC 5.3 06/05/2013   HGB 12.9 06/05/2013   HCT 36.7 06/05/2013   MCV 101.7* 06/05/2013   PLT 143* 06/05/2013    Recent Labs Lab 06/04/13 0957 06/05/13 0124  NA 142 139  K 4.4 4.3  CL 102 99  CO2 19 22  BUN 15 18  CREATININE 1.09 1.11*  CALCIUM 8.8 8.5  PROT 7.4  --   BILITOT 0.5  --   ALKPHOS 60  --   ALT 27  --   AST 72*  --   GLUCOSE 266* 126*   No components found with this basename: MAGNESIUM,  No components found with this basename: POCBNP,  Lab Results  Component Value Date   CHOL 226* 04/22/2013   HDL 119 04/22/2013   LDLCALC 93 04/22/2013   TRIG 68 04/22/2013   Lab Results  Component Value Date   DDIMER 0.59* 02/17/2011   No results found for this basename: TSH, T4TOTAL, FREET3, T3FREE, THYROIDAB,  in the last 72 hours No results found for this basename: VITAMINB12, FOLATE, FERRITIN, TIBC, IRON, RETICCTPCT,  in the  last 72 hours No results found for this basename: INR,  in the last 72 hours  Radiology/Studies Dg Chest Portable 1 View  06/04/2013   CLINICAL DATA:  Respiratory distress  EXAM: PORTABLE CHEST - 1 VIEW  COMPARISON:  May 21, 2013  FINDINGS: There is underlying emphysematous change. There is interstitial edema bilaterally with small left effusion. There is a small area of airspace consolidation in the right upper lobe. There is cardiomegaly with mild pulmonary venous hypertension.  There is no appreciable adenopathy.  There is evidence of prior kyphoplasty procedures in the upper and mid thoracic regions.  IMPRESSION: Evidence of congestive heart failure superimposed on emphysematous change. Question mild pneumonia right upper lobe.   Electronically Signed   By: Lowella Grip M.D.   On: 06/04/2013 10:16   Dg Shoulder Left  06/04/2013   CLINICAL DATA:  Shortness of breath.  EXAM: LEFT SHOULDER - 2+ VIEW  COMPARISON:  None.  FINDINGS: Degenerative changes left shoulder. Prominent subacromial spurring. No  evidence of fracture or dislocation. Prior thoracoplasties.  IMPRESSION: Degenerative changes left shoulder. Prominent subacromial spurring noted. MRI left shoulder may prove useful for further evaluation.   Electronically Signed   By: Marcello Moores  Register   On: 06/04/2013 18:24   Echocardiogram Jan 2015 Study Conclusions - Left ventricle: The cavity size was mildly dilated. There was focal basal hypertrophy. Systolic function was severely reduced. The estimated ejection fraction was in the range of 20% to 25%. Akinesis of the anteroseptal and apical myocardium. - Aortic valve: Mild regurgitation. - Mitral valve: Moderate regurgitation. - Left atrium: The atrium was mildly to moderately dilated.  12-lead ECG on presentation - atrial tachycardia at 140 bpm Telemetry reviewed - persistent atrial tachycardia with occasional PVCs  Assessment and Plan Incessant atrial tachycardia Symptomatic with  difficult rate / rhythm control. Now on amiodarone with recurrence and not many other AAD options - prolonged QT precluded use of Tikosyn. Procedural options included EPS +RF ablation versus CRT-D implant +AV node ablation, although with her history of prior device infection requiring system extraction noted and may increase risk. Dr. Lovena Le to see and advise.  Signed, Ileene Hutchinson, PA-C 06/05/2013, 3:44 PM  EP Attending  Patient seen and examined. Patient well known to me from prior procedures. She has atrial tachycardia and worsening heart failure and has undergone catheterization demonstrating a low cardiac output. Note plans for IV milrinone. I would not be inclined to recommend AV node ablation and BiV implant if possible. She has reverted back to NSR and I would think with her multiple medical problems, amiodarone would be most appropriate.   Mikle Bosworth.D.

## 2013-06-05 NOTE — Progress Notes (Addendum)
Patient Name: [name] Date of Encounter: 06/05/2013     Principal Problem:   Acute on chronic systolic HF (heart failure) Active Problems:   Ischemic cardiomyopathy   PAF (paroxysmal atrial fibrillation)   Acute respiratory failure   Hypoxia   Chronic combined systolic and diastolic heart failure, NYHA class 3   CKD (chronic kidney disease)   HTN (hypertension)   Atrial tachycardia   HYPERLIPIDEMIA   CAD (coronary artery disease)   Left shoulder pain- SP IO IV insertion.    SUBJECTIVE  Feeling fine. Ready to eat. She does have some residual SOB and orthopnea. No chest pain.   CURRENT MEDS . aspirin EC  81 mg Oral Daily  . aztreonam  1 g Intravenous 3 times per day  . carvedilol  6.25 mg Oral BID WC  . diltiazem  15 mg Intravenous Once  . ezetimibe  10 mg Oral QHS  . folic acid-pyridoxine-cyancobalamin  1 tablet Oral Daily  . furosemide  40 mg Intravenous BID  . hyoscyamine  0.375 mg Oral BID  . levothyroxine  200 mcg Oral QAC breakfast  . omega-3 acid ethyl esters  1 g Oral Daily  . raloxifene  60 mg Oral Daily  . spironolactone  25 mg Oral Daily  . vancomycin  500 mg Intravenous Q12H    OBJECTIVE  Filed Vitals:   06/05/13 0453 06/05/13 0600 06/05/13 0753 06/05/13 1231  BP:  94/51 91/61 103/70  Pulse:  94 118 76  Temp: 98.7 F (37.1 C)  98.2 F (36.8 C) 98 F (36.7 C)  TempSrc: Oral  Oral Oral  Resp:  29 22 35  Height:      Weight: 147 lb 11.3 oz (67 kg)     SpO2:  95% 97% 100%    Intake/Output Summary (Last 24 hours) at 06/05/13 1343 Last data filed at 06/05/13 0800  Gross per 24 hour  Intake 632.88 ml  Output   1450 ml  Net -817.12 ml   Filed Weights   06/04/13 1025 06/04/13 1900 06/05/13 0453  Weight: 145 lb 8.1 oz (66.001 kg) 147 lb 11.3 oz (67 kg) 147 lb 11.3 oz (67 kg)    PHYSICAL EXAM  General: Pleasant, NAD. Neuro: Alert and oriented X 3. Moves all extremities spontaneously. Psych: Normal affect. HEENT:  Normal  Neck:  Supple without bruits or JVD. Lungs:  Resp regular and unlabored, Crackles at bases.  Heart: irreg irreg, tachy. no s3, s4, or murmurs. Abdomen: Soft, non-tender, non-distended, BS + x 4.  Extremities: No clubbing, cyanosis or edema. DP/PT/Radials 2+ and equal bilaterally.  Accessory Clinical Findings  CBC  Recent Labs  06/04/13 0957 06/05/13 0124  WBC 6.5 5.3  NEUTROABS 3.2  --   HGB 14.7 12.9  HCT 42.8 36.7  MCV 104.1* 101.7*  PLT 215 283*   Basic Metabolic Panel  Recent Labs  06/04/13 0957 06/05/13 0124  NA 142 139  K 4.4 4.3  CL 102 99  CO2 19 22  GLUCOSE 266* 126*  BUN 15 18  CREATININE 1.09 1.11*  CALCIUM 8.8 8.5   Liver Function Tests  Recent Labs  06/04/13 0957  AST 72*  ALT 27  ALKPHOS 60  BILITOT 0.5  PROT 7.4  ALBUMIN 3.4*    Cardiac Enzymes  Recent Labs  06/04/13 2020 06/05/13 0124 06/05/13 0650  TROPONINI <0.30 <0.30 <0.30    TELE  Yesterday tele atrial tachycardia, today atrial fib. freq PVCs. Several 4 beat runs of NSVT. --  Her rhythm appears to have short bursts of atrial tachycardia which is probably just her atrial fibrillation. He does have a slightly prolonged QTC but difficult to assess in A. fib.  Radiology/Studies  Dg Chest Portable 1 View  06/04/2013   CLINICAL DATA:  Respiratory distress  EXAM: PORTABLE CHEST - 1 VIEW  COMPARISON:  May 21, 2013  FINDINGS: There is underlying emphysematous change. There is interstitial edema bilaterally with small left effusion. There is a small area of airspace consolidation in the right upper lobe. There is cardiomegaly with mild pulmonary venous hypertension.  There is no appreciable adenopathy.  There is evidence of prior kyphoplasty procedures in the upper and mid thoracic regions.  IMPRESSION: Evidence of congestive heart failure superimposed on emphysematous change. Question mild pneumonia right upper lobe.   Electronically Signed   By: Lowella Grip M.D.   On: 06/04/2013 10:16     Dg Shoulder Left  06/04/2013   CLINICAL DATA:  Shortness of breath.  EXAM: LEFT SHOULDER - 2+ VIEW  COMPARISON:  None.  FINDINGS: Degenerative changes left shoulder. Prominent subacromial spurring. No evidence of fracture or dislocation. Prior thoracoplasties.  IMPRESSION: Degenerative changes left shoulder. Prominent subacromial spurring noted. MRI left shoulder may prove useful for further evaluation.   Electronically Signed   By: Marcello Moores  Register   On: 06/04/2013 18:24    ASSESSMENT AND PLAN [name] is a 63 year old woman with an ICM (s/p ICD implantation in 2005; gen change 2006; dual chamber upgrade 2014 with subsequent extraction due to infection 11/2012), EF 20-25%, mild AR, mod MR, PA pressure 75mmHg(04/08/13), CAD (last intervention 2004, last cath 2012, myoview 04/2013 with no ischemia), prior CVA, CKD and recent atrial tachycardia (admission earlier this month) who presented yesterday evening with acute onset of respiratory distress and HF with lactic acidosis. She was admitted to the SDU.   Cardiogenic shock- Suspected that she has low output/cardiogenic shock physiology in setting of recurrent tachycardia. Lactate 6, hypotension and respiratory arrest. She is now feeling very well. She does still have some SOB. Her pressure has improved some although still soft. Kidney function stable. Blood gas much improved. PCO2 33.6; P02 66. Acidosis resolved.   Atrial tachydysrhythmia  - started on amio gtt with better rate control. Eliquis switched to heparin as it was thought she may need a RHC. No evidence of PNA so abx were stopped.    Acute on chronic systolic heart failure in the setting of atrial tachydysrhythmia- Evidence of congestive heart failure superimposed on emphysematous change on CXR. Severe SOB and respiratory distress. She does not appear markedly volume overloaded. Given 1 dose lasix and started on IV amio. Weight stable, -817 ml.   Conception Oms PA-C  Pager  [phone]  I seen and evaluated the patient this afternoon along with Perry Mount, PA-C. I've also reviewed the H&P and extensive history. We discussed the patient's presentation I talked with Dr. Haroldine Laws.  It would appear, that she has chronic persistent diastolic heart failure as well as systolic heart failure with her ischemic cardiomyopathy. She presented with flash pulmonary edema which most likely is related to rapid atrial fibrillation/atrial tachycardia. She appeared to be almost in shock from hypoxia upon initial evaluation. If the blood gases were much improved. She is now resting comfortably on room air with good oxygen saturations. She does not seem to be in shock with blood pressures now in the low 100s. She is currently on amiodarone drip.  At this point, she  is out of immediate danger. I do think it'll be worthwhile to assess her overall filling pressures in order to determine the appropriateness and level of adequate diuresis. My plan will be to diuresis her today with IV Lasix and check a right heart cath tomorrow morning. I have asked the heart failure service to assist with this.  I will also consult electrophysiology, as I think the decision needs to be made as to what to do with her fibrillation. She is on amiodarone which we switch to IV. I question the possibility AV nodal ablation with BiVICD placement.  We'll continue antibiotics for today, but likely stop if there is no signs of infection by tomorrow.  Continue current dose of beta blocker, and spironolactone.  Leonie Man, M.D., M.S. Interventional Cardiologist  [place] Pager # [phone] 06/05/2013

## 2013-06-05 NOTE — Progress Notes (Signed)
ANTICOAGULATION CONSULT NOTE - Follow Up Consult  Pharmacy Consult for heparin Indication: atrial fibrillation  Labs:  Recent Labs  06/04/13 0957 06/04/13 1750 06/04/13 2020 06/05/13 0124 06/05/13 0650  HGB 14.7  --   --  12.9  --   HCT 42.8  --   --  36.7  --   PLT 215  --   --  143*  --   APTT  --  26  --   --   --   HEPARINUNFRC  --  <0.10*  --  0.66 0.80*  CREATININE 1.09  --   --  1.11*  --   TROPONINI <0.30  --  <0.30 <0.30  --     Assessment: 63yo female now supratherapeutic on heparin after one level at goal.  Goal of Therapy:  Heparin level 0.3-0.7 units/ml   Plan:  Will decrease heparin gtt by 1-2 units/kg/hr to 800 units/hr and check level in [place], PharmD, BCPS  06/05/2013,7:35 AM

## 2013-06-06 ENCOUNTER — Encounter: Payer: Medicare Other | Admitting: Physician Assistant

## 2013-06-06 ENCOUNTER — Encounter (HOSPITAL_COMMUNITY)
Admission: EM | Disposition: A | Discharge status: Home Health | Payer: Medicare Other | Source: Home / Self Care | Attending: Cardiology

## 2013-06-06 DIAGNOSIS — I5023 Acute on chronic systolic (congestive) heart failure: Secondary | ICD-10-CM | POA: Diagnosis present

## 2013-06-06 HISTORY — PX: RIGHT HEART CATHETERIZATION: SHX5447

## 2013-06-06 LAB — BASIC METABOLIC PANEL
BUN: 21 mg/dL (ref 6–23)
CALCIUM: 8 mg/dL — AB (ref 8.4–10.5)
CO2: 24 meq/L (ref 19–32)
Chloride: 94 mEq/L — ABNORMAL LOW (ref 96–112)
Creatinine, Ser: 1.31 mg/dL — ABNORMAL HIGH (ref 0.50–1.10)
GFR calc Af Amer: 49 mL/min — ABNORMAL LOW (ref >90)
GFR, EST NON AFRICAN AMERICAN: 42 mL/min — AB (ref >90)
GLUCOSE: 198 mg/dL — AB (ref 70–99)
Potassium: 3.6 mEq/L — ABNORMAL LOW (ref 3.7–5.3)
Sodium: 134 mEq/L — ABNORMAL LOW (ref 137–147)

## 2013-06-06 LAB — POCT I-STAT 3, VENOUS BLOOD GAS (G3P V)
Acid-base deficit: 2 mmol/L (ref 0.0–2.0)
Bicarbonate: 23.5 mEq/L (ref 20.0–24.0)
O2 Saturation: 50 %
PCO2 VEN: 43.5 mmHg — AB (ref 45.0–50.0)
PH VEN: 7.341 — AB (ref 7.250–7.300)
TCO2: 25 mmol/L (ref 0–100)
pO2, Ven: 29 mmHg — CL (ref 30.0–45.0)

## 2013-06-06 LAB — CBC
HCT: 33.1 % — ABNORMAL LOW (ref 36.0–46.0)
Hemoglobin: 11.5 g/dL — ABNORMAL LOW (ref 12.0–15.0)
MCH: 35.3 pg — ABNORMAL HIGH (ref 26.0–34.0)
MCHC: 34.7 g/dL (ref 30.0–36.0)
MCV: 101.5 fL — AB (ref 78.0–100.0)
PLATELETS: 127 10*3/uL — AB (ref 150–400)
RBC: 3.26 MIL/uL — AB (ref 3.87–5.11)
RDW: 15.5 % (ref 11.5–15.5)
WBC: 3.9 10*3/uL — ABNORMAL LOW (ref 4.0–10.5)

## 2013-06-06 LAB — PROTIME-INR
INR: 1.32 (ref 0.00–1.49)
Prothrombin Time: 16.1 seconds — ABNORMAL HIGH (ref 11.6–15.2)

## 2013-06-06 LAB — HEPARIN LEVEL (UNFRACTIONATED): HEPARIN UNFRACTIONATED: 0.63 [IU]/mL (ref 0.30–0.70)

## 2013-06-06 SURGERY — RIGHT HEART CATH
Anesthesia: LOCAL

## 2013-06-06 MED ORDER — MIDAZOLAM HCL 2 MG/2ML IJ SOLN
INTRAMUSCULAR | Status: AC
  Filled 2013-06-06: qty 2

## 2013-06-06 MED ORDER — SODIUM CHLORIDE 0.9 % IJ SOLN
10.0000 mL | Freq: Two times a day (BID) | INTRAMUSCULAR | Status: DC
  Administered 2013-06-07: 12 mL

## 2013-06-06 MED ORDER — ASPIRIN 81 MG PO CHEW
81.0000 mg | CHEWABLE_TABLET | ORAL | Status: AC
  Administered 2013-06-06: 81 mg via ORAL
  Filled 2013-06-06: qty 1

## 2013-06-06 MED ORDER — SODIUM CHLORIDE 0.9 % IJ SOLN
10.0000 mL | INTRAMUSCULAR | Status: DC | PRN
  Administered 2013-06-07: 20 mL
  Administered 2013-06-08: 40 mL
  Administered 2013-06-08: 10 mL
  Administered 2013-06-09: 30 mL
  Administered 2013-06-09 – 2013-06-10 (×5): 10 mL
  Administered 2013-06-11: 30 mL

## 2013-06-06 MED ORDER — HEPARIN (PORCINE) IN NACL 2-0.9 UNIT/ML-% IJ SOLN
INTRAMUSCULAR | Status: AC
  Filled 2013-06-06: qty 1000

## 2013-06-06 MED ORDER — SODIUM CHLORIDE 0.9 % IV SOLN: INTRAVENOUS | Status: DC

## 2013-06-06 MED ORDER — APIXABAN 5 MG PO TABS
5.0000 mg | ORAL_TABLET | Freq: Two times a day (BID) | ORAL | Status: DC
  Administered 2013-06-06 – 2013-06-11 (×10): 5 mg via ORAL
  Filled 2013-06-06 (×11): qty 1

## 2013-06-06 MED ORDER — SODIUM CHLORIDE 0.9 % IJ SOLN: 3.0000 mL | Freq: Two times a day (BID) | INTRAMUSCULAR | Status: DC

## 2013-06-06 MED ORDER — ASPIRIN 81 MG PO CHEW: 81.0000 mg | CHEWABLE_TABLET | ORAL | Status: DC

## 2013-06-06 MED ORDER — SODIUM CHLORIDE 0.9 % IJ SOLN: 3.0000 mL | INTRAMUSCULAR | Status: DC | PRN

## 2013-06-06 MED ORDER — FUROSEMIDE 10 MG/ML IJ SOLN
40.0000 mg | Freq: Three times a day (TID) | INTRAMUSCULAR | Status: DC
  Administered 2013-06-06 – 2013-06-07 (×3): 40 mg via INTRAVENOUS
  Filled 2013-06-06 (×6): qty 4

## 2013-06-06 MED ORDER — ACETAMINOPHEN 325 MG PO TABS: 650.0000 mg | ORAL_TABLET | ORAL | Status: DC | PRN

## 2013-06-06 MED ORDER — FENTANYL CITRATE 0.05 MG/ML IJ SOLN
INTRAMUSCULAR | Status: AC
  Filled 2013-06-06: qty 2

## 2013-06-06 MED ORDER — SODIUM CHLORIDE 0.9 % IV SOLN: 250.0000 mL | INTRAVENOUS | Status: DC | PRN

## 2013-06-06 MED ORDER — ONDANSETRON HCL 4 MG/2ML IJ SOLN: 4.0000 mg | Freq: Four times a day (QID) | INTRAMUSCULAR | Status: DC | PRN

## 2013-06-06 MED ORDER — SODIUM CHLORIDE 0.9 % IJ SOLN
3.0000 mL | Freq: Two times a day (BID) | INTRAMUSCULAR | Status: DC
  Administered 2013-06-07: 3 mL via INTRAVENOUS

## 2013-06-06 MED ORDER — SODIUM CHLORIDE 0.9 % IV SOLN
INTRAVENOUS | Status: DC
  Administered 2013-06-06: 10 mL/h via INTRAVENOUS

## 2013-06-06 MED ORDER — MILRINONE IN DEXTROSE 20 MG/100ML IV SOLN
0.1250 ug/kg/min | INTRAVENOUS | Status: DC
  Administered 2013-06-06 – 2013-06-07 (×2): 0.25 ug/kg/min via INTRAVENOUS
  Administered 2013-06-08: 0.125 ug/kg/min via INTRAVENOUS
  Filled 2013-06-06 (×3): qty 100

## 2013-06-06 MED ORDER — LIDOCAINE HCL (PF) 1 % IJ SOLN
INTRAMUSCULAR | Status: AC
  Filled 2013-06-06: qty 30

## 2013-06-06 MED ORDER — NITROGLYCERIN 0.2 MG/ML ON CALL CATH LAB
INTRAVENOUS | Status: AC
  Filled 2013-06-06: qty 1

## 2013-06-06 NOTE — H&P (View-Only) (Signed)
Patient Name: [name] Date of Encounter: 06/05/2013     Principal Problem:   Acute on chronic systolic HF (heart failure) Active Problems:   Ischemic cardiomyopathy   PAF (paroxysmal atrial fibrillation)   Acute respiratory failure   Hypoxia   Chronic combined systolic and diastolic heart failure, NYHA class 3   CKD (chronic kidney disease)   HTN (hypertension)   Atrial tachycardia   HYPERLIPIDEMIA   CAD (coronary artery disease)   Left shoulder pain- SP IO IV insertion.    SUBJECTIVE  Feeling fine. Ready to eat. She does have some residual SOB and orthopnea. No chest pain.   CURRENT MEDS . aspirin EC  81 mg Oral Daily  . aztreonam  1 g Intravenous 3 times per day  . carvedilol  6.25 mg Oral BID WC  . diltiazem  15 mg Intravenous Once  . ezetimibe  10 mg Oral QHS  . folic acid-pyridoxine-cyancobalamin  1 tablet Oral Daily  . furosemide  40 mg Intravenous BID  . hyoscyamine  0.375 mg Oral BID  . levothyroxine  200 mcg Oral QAC breakfast  . omega-3 acid ethyl esters  1 g Oral Daily  . raloxifene  60 mg Oral Daily  . spironolactone  25 mg Oral Daily  . vancomycin  500 mg Intravenous Q12H    OBJECTIVE  Filed Vitals:   06/05/13 0453 06/05/13 0600 06/05/13 0753 06/05/13 1231  BP:  94/51 91/61 103/70  Pulse:  94 118 76  Temp: 98.7 F (37.1 C)  98.2 F (36.8 C) 98 F (36.7 C)  TempSrc: Oral  Oral Oral  Resp:  29 22 35  Height:      Weight: 147 lb 11.3 oz (67 kg)     SpO2:  95% 97% 100%    Intake/Output Summary (Last 24 hours) at 06/05/13 1343 Last data filed at 06/05/13 0800  Gross per 24 hour  Intake 632.88 ml  Output   1450 ml  Net -817.12 ml   Filed Weights   06/04/13 1025 06/04/13 1900 06/05/13 0453  Weight: 145 lb 8.1 oz (66.001 kg) 147 lb 11.3 oz (67 kg) 147 lb 11.3 oz (67 kg)    PHYSICAL EXAM  General: Pleasant, NAD. Neuro: Alert and oriented X 3. Moves all extremities spontaneously. Psych: Normal affect. HEENT:  Normal  Neck:  Supple without bruits or JVD. Lungs:  Resp regular and unlabored, Crackles at bases.  Heart: irreg irreg, tachy. no s3, s4, or murmurs. Abdomen: Soft, non-tender, non-distended, BS + x 4.  Extremities: No clubbing, cyanosis or edema. DP/PT/Radials 2+ and equal bilaterally.  Accessory Clinical Findings  CBC  Recent Labs  06/04/13 0957 06/05/13 0124  WBC 6.5 5.3  NEUTROABS 3.2  --   HGB 14.7 12.9  HCT 42.8 36.7  MCV 104.1* 101.7*  PLT 215 283*   Basic Metabolic Panel  Recent Labs  06/04/13 0957 06/05/13 0124  NA 142 139  K 4.4 4.3  CL 102 99  CO2 19 22  GLUCOSE 266* 126*  BUN 15 18  CREATININE 1.09 1.11*  CALCIUM 8.8 8.5   Liver Function Tests  Recent Labs  06/04/13 0957  AST 72*  ALT 27  ALKPHOS 60  BILITOT 0.5  PROT 7.4  ALBUMIN 3.4*    Cardiac Enzymes  Recent Labs  06/04/13 2020 06/05/13 0124 06/05/13 0650  TROPONINI <0.30 <0.30 <0.30    TELE  Yesterday tele atrial tachycardia, today atrial fib. freq PVCs. Several 4 beat runs of NSVT. --  Her rhythm appears to have short bursts of atrial tachycardia which is probably just her atrial fibrillation. He does have a slightly prolonged QTC but difficult to assess in A. fib.  Radiology/Studies  Dg Chest Portable 1 View  06/04/2013   CLINICAL DATA:  Respiratory distress  EXAM: PORTABLE CHEST - 1 VIEW  COMPARISON:  May 21, 2013  FINDINGS: There is underlying emphysematous change. There is interstitial edema bilaterally with small left effusion. There is a small area of airspace consolidation in the right upper lobe. There is cardiomegaly with mild pulmonary venous hypertension.  There is no appreciable adenopathy.  There is evidence of prior kyphoplasty procedures in the upper and mid thoracic regions.  IMPRESSION: Evidence of congestive heart failure superimposed on emphysematous change. Question mild pneumonia right upper lobe.   Electronically Signed   By: Lowella Grip M.D.   On: 06/04/2013 10:16     Dg Shoulder Left  06/04/2013   CLINICAL DATA:  Shortness of breath.  EXAM: LEFT SHOULDER - 2+ VIEW  COMPARISON:  None.  FINDINGS: Degenerative changes left shoulder. Prominent subacromial spurring. No evidence of fracture or dislocation. Prior thoracoplasties.  IMPRESSION: Degenerative changes left shoulder. Prominent subacromial spurring noted. MRI left shoulder may prove useful for further evaluation.   Electronically Signed   By: Marcello Moores  Register   On: 06/04/2013 18:24    ASSESSMENT AND PLAN [name] is a 63 year old woman with an ICM (s/p ICD implantation in 2005; gen change 2006; dual chamber upgrade 2014 with subsequent extraction due to infection 11/2012), EF 20-25%, mild AR, mod MR, PA pressure 75mmHg(04/08/13), CAD (last intervention 2004, last cath 2012, myoview 04/2013 with no ischemia), prior CVA, CKD and recent atrial tachycardia (admission earlier this month) who presented yesterday evening with acute onset of respiratory distress and HF with lactic acidosis. She was admitted to the SDU.   Cardiogenic shock- Suspected that she has low output/cardiogenic shock physiology in setting of recurrent tachycardia. Lactate 6, hypotension and respiratory arrest. She is now feeling very well. She does still have some SOB. Her pressure has improved some although still soft. Kidney function stable. Blood gas much improved. PCO2 33.6; P02 66. Acidosis resolved.   Atrial tachydysrhythmia  - started on amio gtt with better rate control. Eliquis switched to heparin as it was thought she may need a RHC. No evidence of PNA so abx were stopped.    Acute on chronic systolic heart failure in the setting of atrial tachydysrhythmia- Evidence of congestive heart failure superimposed on emphysematous change on CXR. Severe SOB and respiratory distress. She does not appear markedly volume overloaded. Given 1 dose lasix and started on IV amio. Weight stable, -817 ml.   Conception Oms PA-C  Pager  [phone]  I seen and evaluated the patient this afternoon along with Perry Mount, PA-C. I've also reviewed the H&P and extensive history. We discussed the patient's presentation I talked with Dr. Haroldine Laws.  It would appear, that she has chronic persistent diastolic heart failure as well as systolic heart failure with her ischemic cardiomyopathy. She presented with flash pulmonary edema which most likely is related to rapid atrial fibrillation/atrial tachycardia. She appeared to be almost in shock from hypoxia upon initial evaluation. If the blood gases were much improved. She is now resting comfortably on room air with good oxygen saturations. She does not seem to be in shock with blood pressures now in the low 100s. She is currently on amiodarone drip.  At this point, she  is out of immediate danger. I do think it'll be worthwhile to assess her overall filling pressures in order to determine the appropriateness and level of adequate diuresis. My plan will be to diuresis her today with IV Lasix and check a right heart cath tomorrow morning. I have asked the heart failure service to assist with this.  I will also consult electrophysiology, as I think the decision needs to be made as to what to do with her fibrillation. She is on amiodarone which we switch to IV. I question the possibility AV nodal ablation with BiVICD placement.  We'll continue antibiotics for today, but likely stop if there is no signs of infection by tomorrow.  Continue current dose of beta blocker, and spironolactone.  Leonie Man, M.D., M.S. Interventional Cardiologist  [place] Pager # [phone] 06/05/2013

## 2013-06-06 NOTE — Progress Notes (Signed)
ANTICOAGULATION CONSULT NOTE - Follow Up Consult  Pharmacy Consult for apixaban Indication: atrial fibrillation  Allergies  Allergen Reactions  . Coconut Oil Anaphylaxis and Hives  . Lansoprazole Nausea Only  . Penicillins Swelling    Throat swells  . Statins Other (See Comments)    cramps  . Lisinopril     Cough- but still tolerates it  . Lactose Intolerance (Gi) Diarrhea and Other (See Comments)    Patient reports abdominal cramping and diarrhea with lactose products.     Patient Measurements: Height: 5\' 6"  (167.6 cm) Weight: 147 lb 11.3 oz (67 kg) IBW/kg (Calculated) : 59.3  Vital Signs: Temp: 98.7 F (37.1 C) (03/19 0752) Temp src: Oral (03/19 0752) BP: 107/52 mmHg (03/19 0700) Pulse Rate: 56 (03/19 0809)  Labs:  Recent Labs  06/04/13 0957  06/04/13 1750 06/04/13 2020 06/05/13 0124  06/05/13 0650 06/05/13 2207 06/06/13 0422  HGB 14.7  --   --   --  12.9  --   --   --  11.5*  HCT 42.8  --   --   --  36.7  --   --   --  33.1*  PLT 215  --   --   --  143*  --   --   --  127*  APTT  --   --  26  --   --   --   --   --   --   LABPROT  --   --   --   --   --   --  24.6*  --  16.1*  INR  --   --   --   --   --   --  2.31*  --  1.32  HEPARINUNFRC  --   < > <0.10*  --  0.66  < > 0.80* 0.79* 0.63  CREATININE 1.09  --   --   --  1.11*  --   --   --  1.31*  TROPONINI <0.30  --   --  <0.30 <0.30  --  <0.30  --   --   < > = values in this interval not displayed.  Estimated Creatinine Clearance: 41.1 ml/min (by C-G formula based on Cr of 1.31).  Assessment: 62 y/o female on apixaban PTA for Afib who was started on heparin while apixaban was on hold. Pharmacy consulted to resume apixaban tonight if no problems with groin site after RHC. SCr rising but still remains appropriate for 5 mg bid dosing. No bleeding noted, H/H and platelets are lower than admit - will continue to watch trend.  Goal of Therapy:  Therapeutic anticoagulation Monitor platelets by anticoagulation  protocol: Yes   Plan:  -Resume apixaban 5 mg PO bid tonight -Monitor for s/sx of bleeding  [facility], Pharm.D., BCPS Clinical Pharmacist Pager: [phone] 06/06/2013 9:57 AM

## 2013-06-06 NOTE — Progress Notes (Signed)
Peripherally Inserted Central Catheter/Midline Placement  The IV Nurse has discussed with the patient and/or persons authorized to consent for the patient, the purpose of this procedure and the potential benefits and risks involved with this procedure.  The benefits include less needle sticks, lab draws from the catheter and patient may be discharged home with the catheter.  Risks include, but not limited to, infection, bleeding, blood clot (thrombus formation), and puncture of an artery; nerve damage and irregular heat beat.  Alternatives to this procedure were also discussed.  PICC/Midline Placement Documentation        Mahogany Torrance, Nicolette Bang 06/06/2013, 6:21 PM

## 2013-06-06 NOTE — CV Procedure (Addendum)
   Cardiac Catheterization Procedure Note  Name: [name] MRN: [id number] DOB: 02/25/1951  Procedure: Right Heart Cath  Indication: Concern for low output, volume overload state.    Procedural Details: The right groin was prepped, draped, and anesthetized with 1% lidocaine. Using the modified Seldinger technique a 7 French sheath was placed in the right femoral vein. A Swan-Ganz catheter was used for the right heart catheterization. Standard protocol was followed for recording of right heart pressures and sampling of oxygen saturations. Fick and thermodiluation cardiac output was calculated. There were no immediate procedural complications. The patient was transferred to the post catheterization recovery area for further monitoring.  Procedural Findings: Hemodynamics (mmHg) RA mean 10 RV 50/13 PA 49/21, mean 31 PCWP mean 21  Oxygen saturations: PA 50% AO 94%  Cardiac Output (Fick) 3.4   Cardiac Index (Fick) 1.9 PVR (Fick) 2.9 WU  Cardiac Output (Thermodilution) 2.2  Cardiac Index (Thermodilution) 1.2 PVR (Themodilution) 4.5 WU  Final Conclusions:  There is still some elevation in right and left heart filling pressure.  The cardiac index is very low.  There does appear to be a low output/volume overloaded state.  Patient continues to report dyspnea.   Recommendations:  1. Acute/chronic systolic CHF with low output by RHC: Creatinine is rising, diuresis has not been vigorous.  Cardiac output is low.  - Start milrinone 0.25 mcg/kg/min.  - Place PICC to monitor co-ox and CVP.  - Lasix 40 mg IV every 8 hours.  - Will continue current Coreg for now.  Will try to transition off milrinone onto digoxin + ACEI versus hydral/Imdur.   2. Paroxysmal atrial fibrillation: She is in NSR on amiodarone gtt.  Continue amiodarone gtt while on milrinone.  QTc prolonged on amiodarone.  Will give KCl and magnesium sulfate this morning.  She has been seen by EP this morning.  There is a question  of AV nodal ablate + BiV pacing.  Will await Dr. Olin Pia input. Restart apixaban this evening unless EP has plans for procedure.  3. CAD: Stable.   4. ID: No sign of PNA.  Can stop antibiotics.  5. CKD:  Creatinine rising with diuresis.  Suspect cardiorenal component.    Loralie Champagne 06/06/2013, 9:06 AM

## 2013-06-06 NOTE — Interval H&P Note (Signed)
History and Physical Interval Note:  06/06/2013 8:33 AM  [name]  has presented today for surgery, with the diagnosis of hf  The various methods of treatment have been discussed with the patient and family. After consideration of risks, benefits and other options for treatment, the patient has consented to  Procedure(s): RIGHT HEART CATH (N/A) as a surgical intervention .  The patient's history has been reviewed, patient examined, no change in status, stable for surgery.  I have reviewed the patient's chart and labs.  Questions were answered to the patient's satisfaction.     Kaetlin Bullen [employer]

## 2013-06-06 NOTE — Progress Notes (Signed)
Patient Name: [name] Date of Encounter: 06/06/2013   Principal Problem:   Acute on chronic systolic HF (heart failure) Active Problems:   Ischemic cardiomyopathy   PAF (paroxysmal atrial fibrillation)   Acute respiratory failure   Hypoxia   Chronic combined systolic and diastolic heart failure, NYHA class 3   CKD (chronic kidney disease)   HTN (hypertension)   Atrial tachycardia   HYPERLIPIDEMIA   CAD (coronary artery disease)   Left shoulder pain- SP IO IV insertion.  SUBJECTIVE Feelas much better today - lying flat.  Converted to NSR o/n. No CP.  No N/V  CURRENT MEDS . [facility] HOLD] aspirin EC  81 mg Oral Daily  . [MAR HOLD] aztreonam  1 g Intravenous 3 times per day  . [facility] HOLD] carvedilol  6.25 mg Oral BID WC  . [facility] HOLD] diltiazem  15 mg Intravenous Once  . [facility] HOLD] ezetimibe  10 mg Oral QHS  . [MAR HOLD] folic acid-pyridoxine-cyancobalamin  1 tablet Oral Daily  . [facility] HOLD] furosemide  40 mg Intravenous BID  . [facility] HOLD] hyoscyamine  0.375 mg Oral BID  . [facility] HOLD] levothyroxine  200 mcg Oral QAC breakfast  . [MAR HOLD] omega-3 acid ethyl esters  1 g Oral Daily  . [facility] HOLD] raloxifene  60 mg Oral Daily  . sodium chloride  3 mL Intravenous Q12H  . sodium chloride  3 mL Intravenous Q12H  . [facility] HOLD] spironolactone  25 mg Oral Daily  . [facility] HOLD] vancomycin  500 mg Intravenous Q12H    OBJECTIVE  Filed Vitals:   06/06/13 0349 06/06/13 0700 06/06/13 0752 06/06/13 0809  BP:  107/52    Pulse:   52 56  Temp:   98.7 F (37.1 C)   TempSrc:   Oral   Resp:   15   Height:      Weight: 147 lb 11.3 oz (67 kg)     SpO2:   100%     Intake/Output Summary (Last 24 hours) at 06/06/13 0914 Last data filed at 06/06/13 0418  Gross per 24 hour  Intake    727 ml  Output   1400 ml  Net   -673 ml   Filed Weights   06/05/13 0453 06/06/13 0348 06/06/13 0349  Weight: 147 lb 11.3 oz (67 kg) 147 lb 11.3 oz (67 kg) 147 lb 11.3 oz (67 kg)    PHYSICAL  EXAM  General: Pleasant, NAD. Neuro: Alert and oriented X 3. Moves all extremities spontaneously. Psych: Normal affect. HEENT:  Normal  Neck: Supple without bruits or JVD. Lungs:  Resp regular and unlabored, Decreased Crackles at bases.  Heart: RRR, NL s1 s2, +s4. no s3, Soft HSM @ apex Abdomen: Soft, non-tender, non-distended, BS + x 4.  Extremities: No clubbing, cyanosis or edema. DP/PT/Radials 2+ and equal bilaterally.  Accessory Clinical Findings  CBC  Recent Labs  06/04/13 0957 06/05/13 0124 06/06/13 0422  WBC 6.5 5.3 3.9*  NEUTROABS 3.2  --   --   HGB 14.7 12.9 11.5*  HCT 42.8 36.7 33.1*  MCV 104.1* 101.7* 101.5*  PLT 215 143* 027*   Basic Metabolic Panel  Recent Labs  06/05/13 0124 06/06/13 0422  NA 139 134*  K 4.3 3.6*  CL 99 94*  CO2 22 24  GLUCOSE 126* 198*  BUN 18 21  CREATININE 1.11* 1.31*  CALCIUM 8.5 8.0*   Liver Function Tests  Recent Labs  06/04/13 0957  AST 72*  ALT 27  ALKPHOS 60  BILITOT 0.5  PROT 7.4  ALBUMIN 3.4*    Cardiac Enzymes  Recent Labs  06/04/13 2020 06/05/13 0124 06/05/13 0650  TROPONINI <0.30 <0.30 <0.30    TELE -- Glade Stanford in 50s. Converted o/n/   Radiology/Studies  ASSESSMENT AND PLAN [name] is a 63 year old woman with an ICM (s/p ICD implantation in 2005; gen change 2006; dual chamber upgrade 2014 with subsequent extraction due to infection 11/2012), EF 20-25%, mild AR, mod MR, PA pressure 28mmHg(04/08/13), CAD (last intervention 2004, last cath 2012, myoview 04/2013 with no ischemia), prior CVA, CKD and recent atrial tachycardia (admission earlier this month) who presented yesterday evening with acute onset of respiratory distress and HF with lactic acidosis. She was admitted to the SDU.   Cardiogenic shock- Resolved.  Brief.  Suspect that low BP was related to hypoxic respiratory failure related to flash pulmonary edema --> with the route cause being A on C diastolic HF exacerbating Systolic HF in setting  of AFib RVR.    BP now stable.  Acidosis resolved.  Atrial tachydysrhythmia  - Converted on Amio o/n.  Restart Eliquis post cath.   Convert to PO Amiodarone   EP consult pending.  Acute on chronic systolic heart failure in the setting of atrial tachydysrhythmia-   Acute decompensation mostly resolved.   Plan RHC today to determine baseline pulmonary & PCWP pressures to determine extent of diuresis & afterload reduction required.  Also CO/CI will help with risk stratification re: ? Replacement of ICD, or even plans for AVN ablation (medically or RFA) & BiVICD for BiV Pacing.  Continue current dose of beta blocker, and spironolactone.  Not currently on afterload reduction -- will await RHC results to determine best COA (appreciate Recs of CHF Service MD performing Cath).  D/cAbx as no SSx of PNA.    [name], M.D., M.S. Interventional Cardiologist  [place] Pager # [phone] 06/06/2013

## 2013-06-06 NOTE — Progress Notes (Addendum)
Patient signed up with [facility] services during last hospitalization. However, she was not interested in home visits. [facility] Care Management will continue to follow post hospital discharge. Inpatient RN case manager already aware from [facility]. Patient could benefit from home health RN for CHF management as well. She is active with [facility] for PT services.  Marthenia Rolling, MSN- [facility] [phone]

## 2013-06-06 NOTE — Progress Notes (Signed)
[id number] patient went to cath lab during report this morning, unable to do total assessment. Patient was alert and orient when she went to the cath lab. [facility] RN.

## 2013-06-07 DIAGNOSIS — N189 Chronic kidney disease, unspecified: Secondary | ICD-10-CM

## 2013-06-07 LAB — BASIC METABOLIC PANEL
BUN: 17 mg/dL (ref 6–23)
CHLORIDE: 96 meq/L (ref 96–112)
CO2: 26 mEq/L (ref 19–32)
Calcium: 8.1 mg/dL — ABNORMAL LOW (ref 8.4–10.5)
Creatinine, Ser: 1.2 mg/dL — ABNORMAL HIGH (ref 0.50–1.10)
GFR, EST AFRICAN AMERICAN: 55 mL/min — AB (ref >90)
GFR, EST NON AFRICAN AMERICAN: 47 mL/min — AB (ref >90)
Glucose, Bld: 150 mg/dL — ABNORMAL HIGH (ref 70–99)
POTASSIUM: 3.2 meq/L — AB (ref 3.7–5.3)
SODIUM: 137 meq/L (ref 137–147)

## 2013-06-07 LAB — CBC
HCT: 31.7 % — ABNORMAL LOW (ref 36.0–46.0)
Hemoglobin: 11.1 g/dL — ABNORMAL LOW (ref 12.0–15.0)
MCH: 35.4 pg — ABNORMAL HIGH (ref 26.0–34.0)
MCHC: 35 g/dL (ref 30.0–36.0)
MCV: 101 fL — AB (ref 78.0–100.0)
PLATELETS: 125 10*3/uL — AB (ref 150–400)
RBC: 3.14 MIL/uL — AB (ref 3.87–5.11)
RDW: 15 % (ref 11.5–15.5)
WBC: 4.1 10*3/uL (ref 4.0–10.5)

## 2013-06-07 LAB — CARBOXYHEMOGLOBIN
CARBOXYHEMOGLOBIN: 1.2 % (ref 0.5–1.5)
CARBOXYHEMOGLOBIN: 1.2 % (ref 0.5–1.5)
METHEMOGLOBIN: 0.8 % (ref 0.0–1.5)
Methemoglobin: 0.8 % (ref 0.0–1.5)
O2 SAT: 71.1 %
O2 SAT: 61 %
TOTAL HEMOGLOBIN: 11.3 g/dL — AB (ref 12.0–16.0)
Total hemoglobin: 11 g/dL — ABNORMAL LOW (ref 12.0–16.0)

## 2013-06-07 LAB — HEPARIN LEVEL (UNFRACTIONATED): Heparin Unfractionated: 2.2 IU/mL — ABNORMAL HIGH (ref 0.30–0.70)

## 2013-06-07 MED ORDER — CARVEDILOL 6.25 MG PO TABS
6.2500 mg | ORAL_TABLET | Freq: Two times a day (BID) | ORAL | Status: DC
  Administered 2013-06-07: 3.125 mg via ORAL
  Filled 2013-06-07 (×3): qty 1

## 2013-06-07 MED ORDER — POTASSIUM CHLORIDE CRYS ER 20 MEQ PO TBCR
40.0000 meq | EXTENDED_RELEASE_TABLET | Freq: Once | ORAL | Status: AC
  Administered 2013-06-07: 40 meq via ORAL
  Filled 2013-06-07: qty 2

## 2013-06-07 MED ORDER — AMIODARONE HCL 200 MG PO TABS
400.0000 mg | ORAL_TABLET | Freq: Two times a day (BID) | ORAL | Status: DC
  Administered 2013-06-07 – 2013-06-10 (×8): 400 mg via ORAL
  Filled 2013-06-07 (×10): qty 2

## 2013-06-07 MED ORDER — CARVEDILOL 3.125 MG PO TABS
3.1250 mg | ORAL_TABLET | Freq: Two times a day (BID) | ORAL | Status: DC
  Administered 2013-06-08 – 2013-06-11 (×6): 3.125 mg via ORAL
  Filled 2013-06-07 (×10): qty 1

## 2013-06-07 MED ORDER — FUROSEMIDE 40 MG PO TABS
40.0000 mg | ORAL_TABLET | Freq: Every day | ORAL | Status: DC
  Administered 2013-06-08 – 2013-06-11 (×4): 40 mg via ORAL
  Filled 2013-06-07 (×4): qty 1

## 2013-06-07 MED ORDER — LEVOTHYROXINE SODIUM 200 MCG PO TABS
200.0000 ug | ORAL_TABLET | Freq: Every day | ORAL | Status: DC
  Administered 2013-06-07 – 2013-06-11 (×5): 200 ug via ORAL
  Filled 2013-06-07 (×6): qty 1

## 2013-06-07 MED ORDER — LOSARTAN POTASSIUM 25 MG PO TABS
12.5000 mg | ORAL_TABLET | Freq: Every day | ORAL | Status: DC
  Administered 2013-06-07 – 2013-06-11 (×3): 12.5 mg via ORAL
  Filled 2013-06-07 (×6): qty 0.5

## 2013-06-07 MED ORDER — DIGOXIN 125 MCG PO TABS
0.1250 mg | ORAL_TABLET | Freq: Every day | ORAL | Status: DC
  Administered 2013-06-07 – 2013-06-08 (×2): 0.125 mg via ORAL
  Filled 2013-06-07 (×5): qty 1

## 2013-06-07 NOTE — Progress Notes (Signed)
Pt transferred to 3E29 @ 1755.  Pt alert and oriented.  No c/o pain.  Sklin warm and dry.  Milrinone drip @ 2.5cc/hr and NS @ 10cc via grey port of Triple lumen PICC.

## 2013-06-07 NOTE — Progress Notes (Addendum)
Advanced Heart Failure Rounding Note   Subjective:    [name] is a 63 year old woman with an ICM (s/p ICD implantation in 2005; gen change 2006; dual chamber upgrade 2014 with subsequent extraction due to infection 11/2012), EF 20-25%, (04/08/13), CAD last intervention 2004, last cath 2012 (patent the LAD and circumflex stents), myoview 04/2013 with no ischemia), prior CVA, chronic kidney disease and atrial arrhythmias.  Presented to ED 05/21/2013 with chest pain, SOB and tachycardia, found to have atrial tachycardia. Started on IV diltiazem with improvement in her rate and symptoms.She was evaluated by Dr. Rayann Heman who recommended AAD therapy with amiodarone and close follow-up as an outpatient. However, prior to discharge, her rate increased and her symptoms returned; therefore, she was started on IV amiodarone and admitted to telemetry. Her rate improved on IV amiodarone and she converted to SR. She was discharged 05/23/2013 on PO amiodarone and Eliquis. On 06/04/2013 she developed sudden onset worsening SOB and was admitted.   [place] 06/06/13: RA mean 10  RV 50/13  PA 49/21, mean 31  PCWP mean 21  Oxygen saturations:  PA 50%  AO 94%  Cardiac Output/Index (Fick) 3.4/1.9  PVR (Fick) 2.9 WU  Cardiac Output/Index (Thermodilution) 2.2/1.2  PVR (Themodilution) 4.5 WU  After cath started on milrinone 0.25 and Lasix 40 mg IV q 8hrs. PICC placed. Co-ox this am 71%. Weight down 4 lbs, 24 hr I/O -1 liters. SBP 100-115s. Denies SOB, orthopnea or CP.   Objective:   Weight Range:  Vital Signs:   Temp:  [98.4 F (36.9 C)-98.7 F (37.1 C)] 98.4 F (36.9 C) (03/20 0707) Pulse Rate:  [38-61] 56 (03/20 0707) Resp:  [13-28] 15 (03/20 0707) BP: (86-119)/(38-87) 119/68 mmHg (03/20 0707) SpO2:  [95 %-100 %] 100 % (03/20 0707) Weight:  [141 lb 5 oz (64.1 kg)-145 lb 4.5 oz (65.9 kg)] 141 lb 5 oz (64.1 kg) (03/20 0500) Last BM Date: 06/04/13 (PTA)  Weight change: Filed Weights   06/06/13 0349 06/06/13  1820 06/07/13 0500  Weight: 147 lb 11.3 oz (67 kg) 145 lb 4.5 oz (65.9 kg) 141 lb 5 oz (64.1 kg)    Intake/Output:   Intake/Output Summary (Last 24 hours) at 06/07/13 0749 Last data filed at 06/07/13 0700  Gross per 24 hour  Intake 1419.15 ml  Output   2500 ml  Net -1080.85 ml    CVP 5 Physical Exam: General:  Well appearing. No resp difficulty; lying flat HEENT: normal Neck: supple. JVP flat . Carotids 2+ bilat; no bruits. No lymphadenopathy or thryomegaly appreciated. Cor: PMI nondisplaced. Regular rate & rhythm. No rubs, gallops or murmurs. Lungs: clear Abdomen: soft, nontender, nondistended. No hepatosplenomegaly. No bruits or masses. Good bowel sounds. Extremities: no cyanosis, clubbing, rash, edema Neuro: alert & orientedx3, cranial nerves grossly intact. moves all 4 extremities w/o difficulty. Affect pleasant  Telemetry: SB 50s  Labs: Basic Metabolic Panel:  Recent Labs Lab 06/04/13 0957 06/05/13 0124 06/06/13 0422 06/07/13 0500  NA 142 139 134* 137  K 4.4 4.3 3.6* 3.2*  CL 102 99 94* 96  CO2 19 22 24 26   GLUCOSE 266* 126* 198* 150*  BUN 15 18 21 17   CREATININE 1.09 1.11* 1.31* 1.20*  CALCIUM 8.8 8.5 8.0* 8.1*    Liver Function Tests:  Recent Labs Lab 06/04/13 0957  AST 72*  ALT 27  ALKPHOS 60  BILITOT 0.5  PROT 7.4  ALBUMIN 3.4*   No results found for this basename: LIPASE, AMYLASE,  in the last  168 hours No results found for this basename: AMMONIA,  in the last 168 hours  CBC:  Recent Labs Lab 06/04/13 0957 06/05/13 0124 06/06/13 0422 06/07/13 0500  WBC 6.5 5.3 3.9* 4.1  NEUTROABS 3.2  --   --   --   HGB 14.7 12.9 11.5* 11.1*  HCT 42.8 36.7 33.1* 31.7*  MCV 104.1* 101.7* 101.5* 101.0*  PLT 215 143* 127* 125*    Cardiac Enzymes:  Recent Labs Lab 06/04/13 0957 06/04/13 2020 06/05/13 0124 06/05/13 0650  TROPONINI <0.30 <0.30 <0.30 <0.30    BNP: BNP (last 3 results)  Recent Labs  04/10/13 1557 04/22/13 1200  06/04/13 0957  PROBNP 3238.0* 754.3* 2458.0*      Imaging:  No results found.   Medications:     Scheduled Medications: . apixaban  5 mg Oral BID  . carvedilol  6.25 mg Oral BID WC  . diltiazem  15 mg Intravenous Once  . ezetimibe  10 mg Oral QHS  . folic acid-pyridoxine-cyancobalamin  1 tablet Oral Daily  . furosemide  40 mg Intravenous 3 times per day  . hyoscyamine  0.375 mg Oral BID  . levothyroxine  200 mcg Oral QAC breakfast  . omega-3 acid ethyl esters  1 g Oral Daily  . raloxifene  60 mg Oral Daily  . sodium chloride  10-40 mL Intracatheter Q12H  . sodium chloride  3 mL Intravenous Q12H  . spironolactone  25 mg Oral Daily     Infusions: . sodium chloride 10 mL/hr (06/06/13 1836)  . amiodarone (NEXTERONE PREMIX) 360 mg/200 mL dextrose 30 mg/hr (06/07/13 0533)  . milrinone 0.25 mcg/kg/min (06/07/13 0556)     PRN Medications:  sodium chloride, acetaminophen, HYDROcodone-acetaminophen, nitroGLYCERIN, ondansetron (ZOFRAN) IV, pantoprazole, polyethylene glycol, sodium chloride, sodium chloride  Assessment Plan/Discussion:    1. Acute/chronic systolic CHF with low output by RHC: Started on milrinone and lasix increased. Co-ox 71% and weight down 4 lbs. Creatinine improving 1.2. CVP 4-6 this am, will transition from IV lasix to PO daily starting tomorrow. Continue to follow Cr. - Will try to start losartan 12.5 mg daily. Cough with ACE-I in the past. - Continue Coreg but lower to 3.125 mg BID and Spiro 25 mg daily. - Will try to wean milrinone down to 0.125 and get co-ox later this afternoon, if stable will try to wean off and start digoxin today If co-ox stable can then transfer to the floor. - Consult CR for ambulation and education. 2. Paroxysmal atrial fibrillation/atrial tachycardia: She is in NSR on amiodarone gtt. Continue amiodarone gtt while on milrinone. QTc prolonged on amiodarone. Will try to wean milrinone off today and if she tolerates then will stop  amiodarone gtt and transition to PO amiodarone.  3. CAD: Stable.  4. ID: No sign of PNA.  5. CKD: Creatinine much improved. Will continue to follow.    Length of Stay: 3 Becky Brunt NP-C 06/07/2013, 7:49 AM  Advanced Heart Failure Team Pager [phone] (M-F; 7a - 4p)  Please contact [facility] for night-coverage after hours (4p -7a ) and weekends on [email or web address]  Patient seen with NP, agree with the above note.  1. Acute on chronic systolic CHF: Ischemic cardiomyopathy, low output on cath yesterday with elevated filling pressures.  Good response to milrinone with good diuresis.  CVP now 4 and co-ox 71%.  - Decrease milrinone to 0.125 today, repeat co-ox this afternoon.  If still adequate, will stop milrinone and follow co-ox.  Plan to start digoxin  this morning.  - Stop IV lasix, will start Lasix 40 mg po daily for now (was on 20 mg daily at home).   - Coreg 3.125 mg bid, losartan 12.5 mg daily, spironolactone 25 mg daily.  2. Atrial tachycardia/history of atrial fibrillation: Maintaining NSR on amiodarone.  She is on apixaban, dosed appropriately.  Transition to po amiodarone today.  EP help appreciated, agree with holding off on ablate/BiV pace and trial of antiarrhythmic.  3. CKD: Stable renal function (slightly improved).   4. Mobilize with PT and rehab.   Loralie Champagne 06/07/2013 9:34 AM

## 2013-06-07 NOTE — Evaluation (Signed)
Physical Therapy Evaluation Patient Details Name: [name] MRN: [id number] DOB: 09-02-1950 Today's Date: 06/07/2013 Time: 2297-9892 PT Time Calculation (min): 23 min  PT Assessment / Plan / Recommendation History of Present Illness  The patient reports sudden onset of dyspnea at 0900hrs this morning along with nausea.  She was coughing up white foam.  She denies any weight gain recently, abd distention, LEE, fever, sick contacts since being discharged.[name]  Her weight is the same as it was on 05/22/13 at last discharge.  She was transported by EMS and became unresponsive.  They attempted intubation but was unsuccessful.  Bag mask was used. Hx of CAG, stents, CHF with EF 20%, spinal stenosis x 4 surgeries and left foot drop  Clinical Impression  Pt very pleasant and moving well but decreased activity tolerance and hypotension from baseline. Pt receiving HHPT and current baseline is walking 500' and desires to be able to return to PLOF. Will follow acutely with recommendation for continued HHPT, daily mobility with nursing and encouraged pt continue HEP acutely. Pt with below deficits and will benefit from acute therapy to maximize independence and function.     PT Assessment  Patient needs continued PT services    Follow Up Recommendations  Home health PT    Does the patient have the potential to tolerate intense rehabilitation      Barriers to Discharge Decreased caregiver support      Equipment Recommendations  None recommended by PT    Recommendations for Other Services     Frequency Min 3X/week    Precautions / Restrictions Precautions Precautions: Fall   Pertinent Vitals/Pain BP 106/76 (80) supine 58/36 (41) initial sitting EOB without dizziness 90/46 (55) after toileting, asymptomatic 104/40 after seated HEP with pressure maintained with gait No pain     Mobility  Bed Mobility Overal bed mobility: Modified Independent Transfers Overall transfer level: Modified  independent Ambulation/Gait Ambulation/Gait assistance: Supervision Ambulation Distance (Feet): 100 Feet Assistive device: None Gait Pattern/deviations: Step-through pattern;Decreased stride length Gait velocity interpretation: at or above normal speed for age/gender General Gait Details: anterior lean with hip flexion    Exercises General Exercises - Lower Extremity Long Arc Quad: AROM;Seated;Both;20 reps Hip Flexion/Marching: AROM;Seated;Both;20 reps   PT Diagnosis: Difficulty walking;Generalized weakness  PT Problem List: Decreased strength;Decreased activity tolerance;Decreased balance;Decreased mobility PT Treatment Interventions: Gait training;Functional mobility training;Stair training;Therapeutic activities;Therapeutic exercise;Patient/family education     PT Goals(Current goals can be found in the care plan section) Acute Rehab PT Goals Patient Stated Goal: return to walking 500' and bowling PT Goal Formulation: With patient Time For Goal Achievement: 06/14/13 Potential to Achieve Goals: Good  Visit Information  Last PT Received On: 06/07/13 Assistance Needed: +1 History of Present Illness: The patient reports sudden onset of dyspnea at 0900hrs this morning along with nausea.  She was coughing up white foam.  She denies any weight gain recently, abd distention, LEE, fever, sick contacts since being discharged.[name]  Her weight is the same as it was on 05/22/13 at last discharge.  She was transported by EMS and became unresponsive.  They attempted intubation but was unsuccessful.  Bag mask was used. Hx of CAG, stents, CHF with EF 20%, spinal stenosis x 4 surgeries and left foot drop       Prior Functioning  Home Living Family/patient expects to be discharged to:: Private residence Living Arrangements: Alone Available Help at Discharge: Neighbor;Friend(s);Available PRN/intermittently Type of Home: House Home Access: Stairs to enter Entrance Stairs-Number of Steps: 3 Entrance  Stairs-Rails: Left Home Layout: One level Home Equipment: Cane - single point;Walker - 2 wheels;Grab bars - toilet;Grab bars - tub/shower Additional Comments: walk in shower and elevated toilet Prior Function Level of Independence: Independent with assistive device(s) Comments: drives; grocery shops with electric cart; has a housekeeper;  Communication Communication: No difficulties    Cognition  Cognition Arousal/Alertness: Awake/alert Behavior During Therapy: WFL for tasks assessed/performed Overall Cognitive Status: Within Functional Limits for tasks assessed    Extremity/Trunk Assessment Upper Extremity Assessment Upper Extremity Assessment: Overall WFL for tasks assessed Lower Extremity Assessment Lower Extremity Assessment: Generalized weakness Cervical / Trunk Assessment Cervical / Trunk Assessment: Other exceptions Cervical / Trunk Exceptions: maintains anterior lean with hip flexion in standing   Balance    End of Session PT - End of Session Activity Tolerance: Patient tolerated treatment well Patient left: in chair;with call bell/phone within reach;with family/visitor present Nurse Communication: Mobility status  GP     [name] 06/07/2013, 2:22 PM  [name], [place]

## 2013-06-07 NOTE — Care Management Note (Addendum)
    Page 1 of 2   06/10/2013     11:23:32 AM   CARE MANAGEMENT NOTE 06/10/2013  Patient:  [name],[name]   Account Number:  [id number]  Date Initiated:  06/05/2013  Documentation initiated by:  MAYO,HENRIETTA  Subjective/Objective Assessment:   dx AFib w/RVR  active with [facility] for PT, followed by [facility] community case management    PCP  Dr [name]     Action/Plan:   milrinone gtts//resume home health services   Anticipated DC Date:  06/12/2013   Anticipated DC Plan:  [place]  CM consult      [facility] Choice  Resumption Of Svcs/PTA Provider   Choice offered to / List presented to:          [facility] arranged  HH-1 RN  [place] [place]      [facility] agency  [facility]   Status of service:  In process, will continue to follow Medicare Important Message given?   (If response is "NO", the following Medicare IM given date fields will be blank) Date Medicare IM given:   Date Additional Medicare IM given:    Discharge Disposition:    Per UR Regulation:  Reviewed for med. necessity/level of care/duration of stay  If discussed at [place] of Stay Meetings, dates discussed:   06/11/2013    Comments:  06/10/13 [place], RN, BSN, NCM [phone] Pt currently active with [facility] for RN/PT services.  Resumption of care requested.  Christa See, RN of [place] notified.  No DME needs identified at this time.  06/07/13 [place] active with PT, will add home health RN for CHF management.  Per [facility] liaison, they can manage pt on milrinone drip if unable to wean.

## 2013-06-08 LAB — BASIC METABOLIC PANEL
BUN: 16 mg/dL (ref 6–23)
CO2: 26 mEq/L (ref 19–32)
CREATININE: 1.19 mg/dL — AB (ref 0.50–1.10)
Calcium: 8.4 mg/dL (ref 8.4–10.5)
Chloride: 100 mEq/L (ref 96–112)
GFR, EST AFRICAN AMERICAN: 55 mL/min — AB (ref >90)
GFR, EST NON AFRICAN AMERICAN: 48 mL/min — AB (ref >90)
Glucose, Bld: 98 mg/dL (ref 70–99)
Potassium: 3.8 mEq/L (ref 3.7–5.3)
SODIUM: 138 meq/L (ref 137–147)

## 2013-06-08 LAB — CBC
HCT: 33.5 % — ABNORMAL LOW (ref 36.0–46.0)
Hemoglobin: 11.5 g/dL — ABNORMAL LOW (ref 12.0–15.0)
MCH: 35 pg — AB (ref 26.0–34.0)
MCHC: 34.3 g/dL (ref 30.0–36.0)
MCV: 101.8 fL — ABNORMAL HIGH (ref 78.0–100.0)
PLATELETS: 129 10*3/uL — AB (ref 150–400)
RBC: 3.29 MIL/uL — ABNORMAL LOW (ref 3.87–5.11)
RDW: 15 % (ref 11.5–15.5)
WBC: 3.5 10*3/uL — ABNORMAL LOW (ref 4.0–10.5)

## 2013-06-08 LAB — CARBOXYHEMOGLOBIN
Carboxyhemoglobin: 1.1 % (ref 0.5–1.5)
Methemoglobin: 0.8 % (ref 0.0–1.5)
O2 Saturation: 49.8 %
TOTAL HEMOGLOBIN: 14.3 g/dL (ref 12.0–16.0)

## 2013-06-08 MED ORDER — ALTEPLASE 2 MG IJ SOLR
2.0000 mg | Freq: Once | INTRAMUSCULAR | Status: AC
  Administered 2013-06-08: 2 mg
  Filled 2013-06-08: qty 2

## 2013-06-08 MED ORDER — ACETAMINOPHEN 325 MG PO TABS: 650.0000 mg | ORAL_TABLET | Freq: Four times a day (QID) | ORAL | Status: DC | PRN

## 2013-06-08 NOTE — Progress Notes (Signed)
SUBJECTIVE:  The patient is breathing better.  No distress.  CoOx is not good however.     PHYSICAL EXAM Filed Vitals:   06/07/13 1804 06/07/13 2124 06/08/13 0419 06/08/13 0944  BP: 93/47 112/58 107/50 83/39  Pulse: 57 58 54 61  Temp: 98.3 F (36.8 C) 99 F (37.2 C) 97.7 F (36.5 C) 97.6 F (36.4 C)  TempSrc: Oral Oral Oral Oral  Resp: 18 18 18 18   Height: 5\' 6"  (1.676 m)     Weight: 143 lb 11.2 oz (65.182 kg)  143 lb (64.864 kg)   SpO2: 100% 99% 99% 94%   General:  No acute distress Lungs:  Clear Heart:  RRR Abdomen:  Positive bowel sounds, no rebound no guarding Extremities:  No edema   LABS:  Results for orders placed during the hospital encounter of 06/04/13 (from the past 24 hour(s))  CARBOXYHEMOGLOBIN     Status: Abnormal   Collection Time    06/07/13  1:00 PM      Result Value Ref Range   Total hemoglobin 11.3 (*) 12.0 - 16.0 g/dL   O2 Saturation 61.0     Carboxyhemoglobin 1.2  0.5 - 1.5 %   Methemoglobin 0.8  0.0 - 1.5 %  CBC     Status: Abnormal   Collection Time    06/08/13  5:56 AM      Result Value Ref Range   WBC 3.5 (*) 4.0 - 10.5 K/uL   RBC 3.29 (*) 3.87 - 5.11 MIL/uL   Hemoglobin 11.5 (*) 12.0 - 15.0 g/dL   HCT 33.5 (*) 36.0 - 46.0 %   MCV 101.8 (*) 78.0 - 100.0 fL   MCH 35.0 (*) 26.0 - 34.0 pg   MCHC 34.3  30.0 - 36.0 g/dL   RDW 15.0  11.5 - 15.5 %   Platelets 129 (*) 150 - 400 K/uL  BASIC METABOLIC PANEL     Status: Abnormal   Collection Time    06/08/13  5:56 AM      Result Value Ref Range   Sodium 138  137 - 147 mEq/L   Potassium 3.8  3.7 - 5.3 mEq/L   Chloride 100  96 - 112 mEq/L   CO2 26  19 - 32 mEq/L   Glucose, Bld 98  70 - 99 mg/dL   BUN 16  6 - 23 mg/dL   Creatinine, Ser 1.19 (*) 0.50 - 1.10 mg/dL   Calcium 8.4  8.4 - 10.5 mg/dL   GFR calc non Af Amer 48 (*) >90 mL/min   GFR calc Af Amer 55 (*) >90 mL/min  CARBOXYHEMOGLOBIN     Status: None   Collection Time    06/08/13  7:00 AM      Result Value Ref Range   Total  hemoglobin 14.3  12.0 - 16.0 g/dL   O2 Saturation 49.8     Carboxyhemoglobin 1.1  0.5 - 1.5 %   Methemoglobin 0.8  0.0 - 1.5 %    Intake/Output Summary (Last 24 hours) at 06/08/13 1207 Last data filed at 06/08/13 0900  Gross per 24 hour  Intake    390 ml  Output    650 ml  Net   -260 ml     ASSESSMENT AND PLAN:  ACUTE ON CHRONIC SYSTOLIC HF:  CoOx is 49.  Today I am going to continue milrinone.    ATRIAL TACHYCARDIA:  She is in sinus/sinus brady.  I suspect that her acute exacerbations are related to  her atrial arrhythmias. Continue PO amiodarone.    CKD:  Creat is stable.    Jeneen Rinks [facility] 06/08/2013 12:07 PM

## 2013-06-09 LAB — BASIC METABOLIC PANEL
BUN: 16 mg/dL (ref 6–23)
CHLORIDE: 96 meq/L (ref 96–112)
CO2: 28 mEq/L (ref 19–32)
CREATININE: 1.17 mg/dL — AB (ref 0.50–1.10)
Calcium: 9 mg/dL (ref 8.4–10.5)
GFR calc non Af Amer: 49 mL/min — ABNORMAL LOW (ref >90)
GFR, EST AFRICAN AMERICAN: 56 mL/min — AB (ref >90)
Glucose, Bld: 98 mg/dL (ref 70–99)
Potassium: 4.1 mEq/L (ref 3.7–5.3)
Sodium: 135 mEq/L — ABNORMAL LOW (ref 137–147)

## 2013-06-09 LAB — CBC
HEMATOCRIT: 32.3 % — AB (ref 36.0–46.0)
Hemoglobin: 11.4 g/dL — ABNORMAL LOW (ref 12.0–15.0)
MCH: 35.6 pg — ABNORMAL HIGH (ref 26.0–34.0)
MCHC: 35.3 g/dL (ref 30.0–36.0)
MCV: 100.9 fL — ABNORMAL HIGH (ref 78.0–100.0)
Platelets: 151 10*3/uL (ref 150–400)
RBC: 3.2 MIL/uL — ABNORMAL LOW (ref 3.87–5.11)
RDW: 14.7 % (ref 11.5–15.5)
WBC: 3.4 10*3/uL — AB (ref 4.0–10.5)

## 2013-06-09 NOTE — Progress Notes (Signed)
ANTICOAGULATION CONSULT NOTE - Follow Up Consult  Pharmacy Consult for apixaban Indication: atrial fibrillation  Allergies  Allergen Reactions  . Coconut Oil Anaphylaxis and Hives  . Lansoprazole Nausea Only  . Penicillins Swelling    Throat swells  . Statins Other (See Comments)    cramps  . Lisinopril     Cough- but still tolerates it  . Lactose Intolerance (Gi) Diarrhea and Other (See Comments)    Patient reports abdominal cramping and diarrhea with lactose products.     Patient Measurements: Height: 5\' 6"  (167.6 cm) Weight: 142 lb (64.411 kg) (scale b) IBW/kg (Calculated) : 59.3  Vital Signs: Temp: 97.7 F (36.5 C) (03/22 1300) Temp src: Oral (03/22 1300) BP: 105/51 mmHg (03/22 1300) Pulse Rate: 55 (03/22 1300)  Labs:  Recent Labs  06/07/13 0500 06/08/13 0556 06/09/13 0500  HGB 11.1* 11.5* 11.4*  HCT 31.7* 33.5* 32.3*  PLT 125* 129* 151  HEPARINUNFRC 2.20*  --   --   CREATININE 1.20* 1.19* 1.17*    Estimated Creatinine Clearance: 46.1 ml/min (by C-G formula based on Cr of 1.17).  Assessment: 63 y/o female on apixaban PTA for Afib who was started on heparin while apixaban was on hold. SCr now improving and dose remains appropriate at 5 mg bid dosing. No bleeding noted, H/H and platelets are lower than admit, but now stable.  Goal of Therapy:  Therapeutic anticoagulation Monitor platelets by anticoagulation protocol: Yes   Plan:  -Apixaban 5 mg PO bid -Monitor for s/sx of bleeding  Vivia Ewing, PharmD Clinical Pharmacist - Resident Pager: [phone] Pharmacy: [phone] 06/09/2013 2:55 PM

## 2013-06-09 NOTE — Progress Notes (Signed)
   SUBJECTIVE:  The patient is breathing back to her baseline.  No distress.    PHYSICAL EXAM Filed Vitals:   06/08/13 0944 06/08/13 1300 06/08/13 2039 06/09/13 0532  BP: 83/39 97/39 105/61 96/45  Pulse: 61 55 61 54  Temp: 97.6 F (36.4 C) 98.1 F (36.7 C) 98 F (36.7 C) 97.8 F (36.6 C)  TempSrc: Oral Oral Oral Oral  Resp: 18 18 18 18   Height:      Weight:    142 lb (64.411 kg)  SpO2: 94% 99% 94% 98%   General:  No acute distress Lungs:  Clear Heart:  RRR Abdomen:  Positive bowel sounds, no rebound no guarding Extremities:  No edema   LABS:  Results for orders placed during the hospital encounter of 06/04/13 (from the past 24 hour(s))  CBC     Status: Abnormal   Collection Time    06/09/13  5:00 AM      Result Value Ref Range   WBC 3.4 (*) 4.0 - 10.5 K/uL   RBC 3.20 (*) 3.87 - 5.11 MIL/uL   Hemoglobin 11.4 (*) 12.0 - 15.0 g/dL   HCT 32.3 (*) 36.0 - 46.0 %   MCV 100.9 (*) 78.0 - 100.0 fL   MCH 35.6 (*) 26.0 - 34.0 pg   MCHC 35.3  30.0 - 36.0 g/dL   RDW 14.7  11.5 - 15.5 %   Platelets 151  150 - 400 K/uL  BASIC METABOLIC PANEL     Status: Abnormal   Collection Time    06/09/13  5:00 AM      Result Value Ref Range   Sodium 135 (*) 137 - 147 mEq/L   Potassium 4.1  3.7 - 5.3 mEq/L   Chloride 96  96 - 112 mEq/L   CO2 28  19 - 32 mEq/L   Glucose, Bld 98  70 - 99 mg/dL   BUN 16  6 - 23 mg/dL   Creatinine, Ser 1.17 (*) 0.50 - 1.10 mg/dL   Calcium 9.0  8.4 - 10.5 mg/dL   GFR calc non Af Amer 49 (*) >90 mL/min   GFR calc Af Amer 56 (*) >90 mL/min    Intake/Output Summary (Last 24 hours) at 06/09/13 0850 Last data filed at 06/09/13 0700  Gross per 24 hour  Intake  662.5 ml  Output   1600 ml  Net -937.5 ml     ASSESSMENT AND PLAN:  ACUTE ON CHRONIC SYSTOLIC HF:   Despite the poor CoOx yesterday in NSR, she looks good.  She appears to be euvolemic.  I will stop the milrinone and check a labs again in the AM.  She might need chronic inotropic therapy.   BP will  not tolerate further med titration.   ATRIAL TACHYCARDIA:  She is in sinus/sinus brady.  I suspect that her acute exacerbations are related to her atrial arrhythmias. Continue PO amiodarone.  We need to consider AV ablation and reinsertion of BiV ICD.  Dr. Haroldine Laws will discuss with the patient again in the AM.   CKD:  Creat is stable.    Minus Breeding 06/09/2013 8:50 AM

## 2013-06-10 LAB — CULTURE, BLOOD (SINGLE): Culture: NO GROWTH

## 2013-06-10 LAB — CARBOXYHEMOGLOBIN
CARBOXYHEMOGLOBIN: 1.5 % (ref 0.5–1.5)
Methemoglobin: 1.6 % — ABNORMAL HIGH (ref 0.0–1.5)
O2 Saturation: 61.6 %
Total hemoglobin: 12.3 g/dL (ref 12.0–16.0)

## 2013-06-10 LAB — BASIC METABOLIC PANEL
BUN: 17 mg/dL (ref 6–23)
CO2: 29 mEq/L (ref 19–32)
CREATININE: 1.21 mg/dL — AB (ref 0.50–1.10)
Calcium: 9.1 mg/dL (ref 8.4–10.5)
Chloride: 94 mEq/L — ABNORMAL LOW (ref 96–112)
GFR, EST AFRICAN AMERICAN: 54 mL/min — AB (ref >90)
GFR, EST NON AFRICAN AMERICAN: 47 mL/min — AB (ref >90)
Glucose, Bld: 97 mg/dL (ref 70–99)
POTASSIUM: 3.7 meq/L (ref 3.7–5.3)
Sodium: 135 mEq/L — ABNORMAL LOW (ref 137–147)

## 2013-06-10 LAB — CBC
HCT: 36.8 % (ref 36.0–46.0)
Hemoglobin: 12.8 g/dL (ref 12.0–15.0)
MCH: 35.1 pg — ABNORMAL HIGH (ref 26.0–34.0)
MCHC: 34.8 g/dL (ref 30.0–36.0)
MCV: 100.8 fL — ABNORMAL HIGH (ref 78.0–100.0)
Platelets: 154 10*3/uL (ref 150–400)
RBC: 3.65 MIL/uL — ABNORMAL LOW (ref 3.87–5.11)
RDW: 14.7 % (ref 11.5–15.5)
WBC: 3.1 10*3/uL — ABNORMAL LOW (ref 4.0–10.5)

## 2013-06-10 NOTE — Progress Notes (Signed)
Patient ID: [name], female   DOB: 08-12-1950, 63 y.o.   MRN: [id number] Advanced Heart Failure Rounding Note   Subjective:    [name] is a 63 year old woman with an ICM (s/p ICD implantation in 2005; gen change 2006; dual chamber upgrade 2014 with subsequent extraction due to infection 11/2012), EF 20-25%, (04/08/13), CAD last intervention 2004, last cath 2012 (patent the LAD and circumflex stents), myoview 04/2013 with no ischemia), prior CVA, chronic kidney disease and atrial arrhythmias.  Presented to ED 05/21/2013 with chest pain, SOB and tachycardia, found to have atrial tachycardia. Started on IV diltiazem with improvement in her rate and symptoms.She was evaluated by Dr. Rayann Heman who recommended AAD therapy with amiodarone and close follow-up as an outpatient. However, prior to discharge, her rate increased and her symptoms returned; therefore, she was started on IV amiodarone and admitted to telemetry. Her rate improved on IV amiodarone and she converted to SR. She was discharged 05/23/2013 on PO amiodarone and Eliquis. On 06/04/2013 she developed sudden onset worsening SOB and was admitted.   [place] 06/06/13: RA mean 10  RV 50/13  PA 49/21, mean 31  PCWP mean 21  Oxygen saturations:  PA 50%  AO 94%  Cardiac Output/Index (Fick) 3.4/1.9  PVR (Fick) 2.9 WU  Cardiac Output/Index (Thermodilution) 2.2/1.2  PVR (Themodilution) 4.5 WU  After cath started on milrinone 0.25 and Lasix 40 mg IV q 8hrs. PICC placed. Coox improved on milrinone, diuresed well.  Now on po Lasix and off milrinone.  Coox 49% on Saturday.  BP soft.  She feels quite well and has been walking in the halls with assistance.  She is in NSR.    Objective:   Weight Range:  Vital Signs:   Temp:  [97.7 F (36.5 C)-98.3 F (36.8 C)] 97.8 F (36.6 C) (03/23 0700) Pulse Rate:  [49-55] 51 (03/23 0705) Resp:  [18-20] 18 (03/23 0700) BP: (88-107)/(49-67) 90/58 mmHg (03/23 0705) SpO2:  [95 %-100 %] 97 % (03/23  0700) Weight:  [63.504 kg (140 lb)] 63.504 kg (140 lb) (03/23 0700) Last BM Date: 06/09/13  Weight change: Filed Weights   06/08/13 0419 06/09/13 0532 06/10/13 0700  Weight: 64.864 kg (143 lb) 64.411 kg (142 lb) 63.504 kg (140 lb)    Intake/Output:   Intake/Output Summary (Last 24 hours) at 06/10/13 0820 Last data filed at 06/10/13 0701  Gross per 24 hour  Intake    720 ml  Output   2250 ml  Net  -1530 ml    CVP 5 Physical Exam: General:  Well appearing. No resp difficulty; lying flat HEENT: normal Neck: supple. JVP flat . Carotids 2+ bilat; no bruits. No lymphadenopathy or thryomegaly appreciated. Cor: PMI nondisplaced. Regular rate & rhythm. No rubs, gallops or murmurs. Lungs: clear Abdomen: soft, nontender, nondistended. No hepatosplenomegaly. No bruits or masses. Good bowel sounds. Extremities: no cyanosis, clubbing, rash, edema Neuro: alert & orientedx3, cranial nerves grossly intact. moves all 4 extremities w/o difficulty. Affect pleasant  Telemetry: SB 50s  Labs: Basic Metabolic Panel:  Recent Labs Lab 06/06/13 0422 06/07/13 0500 06/08/13 0556 06/09/13 0500 06/10/13 0500  NA 134* 137 138 135* 135*  K 3.6* 3.2* 3.8 4.1 3.7  CL 94* 96 100 96 94*  CO2 24 26 26 28 29   GLUCOSE 198* 150* 98 98 97  BUN 21 17 16 16 17   CREATININE 1.31* 1.20* 1.19* 1.17* 1.21*  CALCIUM 8.0* 8.1* 8.4 9.0 9.1    Liver Function Tests:  Recent  Labs Lab 06/04/13 0957  AST 72*  ALT 27  ALKPHOS 60  BILITOT 0.5  PROT 7.4  ALBUMIN 3.4*   No results found for this basename: LIPASE, AMYLASE,  in the last 168 hours No results found for this basename: AMMONIA,  in the last 168 hours  CBC:  Recent Labs Lab 06/04/13 0957  06/06/13 0422 06/07/13 0500 06/08/13 0556 06/09/13 0500 06/10/13 0500  WBC 6.5  < > 3.9* 4.1 3.5* 3.4* 3.1*  NEUTROABS 3.2  --   --   --   --   --   --   HGB 14.7  < > 11.5* 11.1* 11.5* 11.4* 12.8  HCT 42.8  < > 33.1* 31.7* 33.5* 32.3* 36.8  MCV 104.1*   < > 101.5* 101.0* 101.8* 100.9* 100.8*  PLT 215  < > 127* 125* 129* 151 154  < > = values in this interval not displayed.  Cardiac Enzymes:  Recent Labs Lab 06/04/13 0957 06/04/13 2020 06/05/13 0124 06/05/13 0650  TROPONINI <0.30 <0.30 <0.30 <0.30    BNP: BNP (last 3 results)  Recent Labs  04/10/13 1557 04/22/13 1200 06/04/13 0957  PROBNP 3238.0* 754.3* 2458.0*      Imaging: No results found.   Medications:     Scheduled Medications: . amiodarone  400 mg Oral BID  . apixaban  5 mg Oral BID  . carvedilol  3.125 mg Oral BID WC  . digoxin  0.125 mg Oral Daily  . ezetimibe  10 mg Oral QHS  . folic acid-pyridoxine-cyancobalamin  1 tablet Oral Daily  . furosemide  40 mg Oral Daily  . hyoscyamine  0.375 mg Oral BID  . levothyroxine  200 mcg Oral QAC breakfast  . losartan  12.5 mg Oral Daily  . omega-3 acid ethyl esters  1 g Oral Daily  . raloxifene  60 mg Oral Daily  . sodium chloride  10-40 mL Intracatheter Q12H  . spironolactone  25 mg Oral Daily    Infusions: . sodium chloride 10 mL/hr at 06/07/13 0800    PRN Medications: acetaminophen, HYDROcodone-acetaminophen, nitroGLYCERIN, pantoprazole, polyethylene glycol, sodium chloride  Assessment Plan/Discussion:   1. Acute on chronic systolic CHF: Ischemic cardiomyopathy, low output on RHC with elevated filling pressures.  EF 20-25% on most recent echo.  Good response to milrinone with good diuresis.  She was transitioned to po Lasix and milrinone was stopped.  Co-ox on Saturday was low.  However, she has felt quite good overall.   - Continue digoxin, spironolactone, low doses of Coreg and losartan.  No BP room to titrate losartan.   - Looks euvolemic, continue po Lasix.  - Will repeat co-ox this morning.  If remains low, will need to consider restarting milrinone and home of milrinone.  If she does well in that situation, may be eventual LVAD candidate.  2. Atrial tachycardia/history of atrial fibrillation:  Maintaining NSR on amiodarone.  She is on apixaban, dosed appropriately.  In discussion with EP have decided on holding off on ablation/Biv pacing for now with hope that full load of amiodarone will keep arrhythmias suppressed.  If not, ablation/pacing is an option.  Will need to follow LFTs on amiodarone, they were elevated in the setting of amiodarone in the past.  Will check tomorrow.  3. CKD: Stable renal function.   4. CAD: Stable.  5. Ambulate, will be able to go home with home health when we determine need for milrinone.   Loralie Champagne 06/10/2013 8:20 AM

## 2013-06-10 NOTE — Progress Notes (Signed)
Patient is currently active with [facility] for chronic disease management services.  Patient has been engaged by a RN Community Care Coordinator.  Our community based plan of care has focused on disease management of CHF, medication procurement/adherence, and community resource support  Patient will receive a post discharge transition of care call and will be evaluated for monthly home visits for assessments and disease process education.  Made Inpatient Case Manager aware that [facility] following. Of note, [facility] services does not replace or interfere with any services that are arranged by inpatient case management or social work.  For additional questions or referrals please contact Tim Henderson BSN RN [facility]iaison at [phone]. °

## 2013-06-10 NOTE — Consult Note (Signed)
Heart Failure Navigator Consult Note  Presentation: [name] is a 63 year old woman with an ICM (s/p ICD implantation in 2005; gen change 2006; dual chamber upgrade 2014 with subsequent extraction due to infection 11/2012), EF 20-25%, mild AR, mod MR, PA pressure 53mmHg(04/08/13), CAD (last intervention 2004, last cath 2012(patent the LAD and circumflex stents), myoview 04/2013 with no ischemia), prior CVA, chronic kidney disease and atrial arrhythmias who presented on 05/21/2013 with chest pain, SOB and tachycardia, found to have atrial tachycardia. In the ED, she was started on IV diltiazem with improvement in her rate and symptoms. Potassium 4.9. Magnesium 1.7. Troponin negative. She was evaluated by Dr. Rayann Heman who recommended AAD therapy with amiodarone and close follow-up as an outpatient. However, prior to discharge, her rate increased and her symptoms returned; therefore, she was started on IV amiodarone and admitted to telemetry. Her rate improved on IV amiodarone and she converted to SR. She now takes PO amiodarone and Eliquis.  The patient reports sudden onset of dyspnea at 0900hrs this morning along with nausea. She was coughing up white foam. She denies any weight gain recently, abd distention, LEE, fever, sick contacts since being discharged.[name] Her weight is the same as it was on 05/22/13 at last discharge. She was transported by EMS and became unresponsive. They attempted intubation but was unsuccessful. Bag mask was used. The patient currently denies vomiting, chest pain, dizziness, PND, abdominal pain, hematochezia, melena. PT has been coming to her house. She occasionally uses a walker.  BNP is elevated at 2458.0. CXR shows CHF and possible RUL PNA. Lactic acid is elevated at 6. She was started on Aztreonam/vanc. Blood cultures drawn   Past Medical History  Diagnosis Date  . Spinal stenosis   . Numbness of foot     left foot  . Diverticulitis     a. s/p partial colectomy 1/12 with  reversal in July 2012.  . Colitis, ischemic   . DJD (degenerative joint disease)     History of multiple surgeries to the back, shoulder and knee  . Ischemic cardiomyopathy     a. Echo 06/16/10: EF 25-30%, anteroseptal and apical hypokinesis, moderate AI, mild MR.  . Chronic systolic heart failure   . CAD (coronary artery disease)     a.  Ant MI 8/03 with stenting of the LAD;  b. staged PCI with Cypher DES to OM1 8/03;  c. s/p Cypher DES to LAD 7/04;  d.  multiple cardiac caths in past (chronically abnl ECG);    e. cardiac catheterization 3/12: LAD stent patent, distal LAD 40-50%, small ostial D1 80%, ostial D2 60%, OM-1 stent patent (20-30%), proximal RCA 50%, proximal to mid RCA 40-50%; f. cath 02/17/2011 - nonobs  . LV (left ventricular) mural thrombus   . CKD (chronic kidney disease), stage III     creatinine:  1.3 in 4/12;    . Tobacco abuse     history  . GERD (gastroesophageal reflux disease)   . Hypertension   . Lower GI bleed     August 2011  . Hypothyroidism   . Aortic insufficiency   . Pseudoaneurysm     History of, right forearm  . Hyperlipidemia   . Compression fracture 07/04/11    L2  . 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 PAF (paroxysmal atrial fibrillation)   . Anatomical narrow angle of right eye   .  Blood transfusion     "must have been when I had the total knee replacement" (04/10/2013)  . Anemia   . H/O hiatal hernia   . Sinus bradycardia 03/05/2012  . Paroxysmal atrial flutter   . Mitral regurgitation   . Heart murmur   . CHF (congestive heart failure)     "3-4 times" (04/10/2013)  . Myocardial infarction 2002  . Stroke     History of TIA and possible CVA; hospitalized 2004; on coumadin  . Stroke     "I've had 3"; denies residual on 04/10/2013  . Chronic lower back pain     History   Social History  . Marital Status: Widowed    Spouse  Name: N/A    Number of Children: N/A  . Years of Education: N/A   Occupational History  . Retired    Social History Main Topics  . Smoking status: Former Smoker -- 0.10 packs/day for 33 years    Types: Cigarettes    Quit date: 04/21/2001  . Smokeless tobacco: Never Used     Comment: "smoked ~ 1 pack/month"  . Alcohol Use: 0.6 oz/week    1 Glasses of wine per week  . Drug Use: No  . Sexual Activity: Not Currently   Other Topics Concern  . None   Social History Narrative   Lives alone, has a house, has some household help. Handicapped upfitted.    ECHO:Study Conclusions-- 04/08/13  - Left ventricle: The cavity size was mildly dilated. There was focal basal hypertrophy. Systolic function was severely reduced. The estimated ejection fraction was in the range of 20% to 25%. Akinesis of the anteroseptal and apical myocardium. - Aortic valve: Mild regurgitation. - Mitral valve: Moderate regurgitation. - Left atrium: The atrium was mildly to moderately dilated   BNP    Component Value Date/Time   PROBNP 2458.0* 06/04/2013 0957    Education Assessment and Provision:  Detailed education and instructions provided on heart failure disease management including the following:  Signs and symptoms of Heart Failure When to call the physician Importance of daily weights Low sodium diet  Fluid restriction Medication management Anticipated future follow-up appointments  Patient education given on each of the above topics.  Patient acknowledges understanding and acceptance of all instructions.  Patient has had much HF education/teaching in the past.  She can teach back topics described above.  She admits that she weighs daily however says that her weight does not shift much if at all and is not a good indicator for her HF status.  She also claims that she usually has no swelling.  She says that she has eaten low sodium since her MI in 2003.  She has a "routine" for when she wakes up  and feels increased shortness of breath that includes taking extra diuretic.    Education Materials:  "Living Better With Heart Failure" Booklet, Daily Weight Tracker Tool and Heart Failure Educational Video.   High Risk Criteria for Readmission and/or Poor Patient Outcomes:  (Recommend Follow-up with [facility])   EF <30%-  Yes  2 or more admissions in 6 months-  Yes  Difficult social situation-  No  Demonstrates medication noncompliance-  No   Barriers of Care:  ? Difficulty recognizing HF exacerbation symptoms   Discharge Planning:   Patient lives alone.  She describes being fairly active until just recently.  She says that she still drives and does her own grocery shopping and cooking.

## 2013-06-10 NOTE — Progress Notes (Signed)
CARDIAC REHAB PHASE I   PRE:  Rate/Rhythm: 6 SB  BP:  Supine:   Sitting: 120/80 manual  Standing:    SaO2: 97%RA  MODE:  Ambulation: 200 ft x 2 for 400 ft   POST:  Rate/Rhythm: 60  BP:  Supine:   Sitting: 109/51 dinamapp  Standing:    SaO2: 99%RA 1400-1435 Pt walked 244ft to desk with hand held asst. After resting few minutes, asked if we could do it again. Pt tolerated well. Rested at 100 ft both walks standing. Happy to be up walking. Encouraged her to walk with staff. Pt has done CRP2 before and wants to do again. Will refer. Not sure if pt meets qualifications for CHF diagnosis for Medicare to cover but will refer. Encouraged pt to take short walks at home in house increasing distance gradually.   Graylon Good, RN BSN  06/10/2013 2:30 PM

## 2013-06-11 LAB — COMPREHENSIVE METABOLIC PANEL
ALT: 23 U/L (ref 0–35)
AST: 63 U/L — ABNORMAL HIGH (ref 0–37)
Albumin: 3.1 g/dL — ABNORMAL LOW (ref 3.5–5.2)
Alkaline Phosphatase: 41 U/L (ref 39–117)
BUN: 21 mg/dL (ref 6–23)
CALCIUM: 9.1 mg/dL (ref 8.4–10.5)
CO2: 27 mEq/L (ref 19–32)
CREATININE: 1.24 mg/dL — AB (ref 0.50–1.10)
Chloride: 94 mEq/L — ABNORMAL LOW (ref 96–112)
GFR calc non Af Amer: 45 mL/min — ABNORMAL LOW (ref >90)
GFR, EST AFRICAN AMERICAN: 52 mL/min — AB (ref >90)
Glucose, Bld: 88 mg/dL (ref 70–99)
Potassium: 3.8 mEq/L (ref 3.7–5.3)
Sodium: 136 mEq/L — ABNORMAL LOW (ref 137–147)
Total Bilirubin: 0.6 mg/dL (ref 0.3–1.2)
Total Protein: 6.7 g/dL (ref 6.0–8.3)

## 2013-06-11 LAB — CBC
HEMATOCRIT: 35.8 % — AB (ref 36.0–46.0)
HEMOGLOBIN: 12.5 g/dL (ref 12.0–15.0)
MCH: 34.7 pg — AB (ref 26.0–34.0)
MCHC: 34.9 g/dL (ref 30.0–36.0)
MCV: 99.4 fL (ref 78.0–100.0)
Platelets: 169 10*3/uL (ref 150–400)
RBC: 3.6 MIL/uL — ABNORMAL LOW (ref 3.87–5.11)
RDW: 14.6 % (ref 11.5–15.5)
WBC: 3.8 10*3/uL — ABNORMAL LOW (ref 4.0–10.5)

## 2013-06-11 MED ORDER — POTASSIUM CHLORIDE CRYS ER 20 MEQ PO TBCR
20.0000 meq | EXTENDED_RELEASE_TABLET | Freq: Once | ORAL | Status: AC
  Administered 2013-06-11: 20 meq via ORAL
  Filled 2013-06-11: qty 1

## 2013-06-11 MED ORDER — FUROSEMIDE 40 MG PO TABS: 40.0000 mg | ORAL_TABLET | Freq: Every day | ORAL | Status: DC

## 2013-06-11 MED ORDER — DIGOXIN 62.5 MCG PO TABS: 0.0625 mg | ORAL_TABLET | Freq: Every day | ORAL | Status: DC

## 2013-06-11 MED ORDER — CARVEDILOL 6.25 MG PO TABS: 3.1250 mg | ORAL_TABLET | Freq: Two times a day (BID) | ORAL | Status: DC

## 2013-06-11 MED ORDER — DIGOXIN 125 MCG PO TABS: 0.1250 mg | ORAL_TABLET | Freq: Every day | ORAL | Status: DC

## 2013-06-11 MED ORDER — DIGOXIN 0.0625 MG HALF TABLET
0.0625 mg | ORAL_TABLET | Freq: Every day | ORAL | Status: DC
  Filled 2013-06-11: qty 1

## 2013-06-11 MED ORDER — AMIODARONE HCL 200 MG PO TABS
200.0000 mg | ORAL_TABLET | Freq: Two times a day (BID) | ORAL | Status: DC
  Administered 2013-06-11: 200 mg via ORAL
  Filled 2013-06-11 (×2): qty 1

## 2013-06-11 MED ORDER — LOSARTAN POTASSIUM 25 MG PO TABS: 12.5000 mg | ORAL_TABLET | Freq: Every day | ORAL | Status: DC

## 2013-06-11 NOTE — Progress Notes (Addendum)
Patient ID: SEVILLE BRICK, female   DOB: 02/04/1951, 63 y.o.   MRN: [id number] Advanced Heart Failure Rounding Note   Subjective:    Ms. Umholtz is a 63 year old woman with an ICM (s/p ICD implantation in 2005; gen change 2006; dual chamber upgrade 2014 with subsequent extraction due to infection 11/2012), EF 20-25%, (04/08/13), CAD last intervention 2004, last cath 2012 (patent the LAD and circumflex stents), myoview 04/2013 with no ischemia), prior CVA, chronic kidney disease and atrial arrhythmias.  Presented to ED 05/21/2013 with chest pain, SOB and tachycardia, found to have atrial tachycardia. Started on IV diltiazem with improvement in her rate and symptoms.She was evaluated by Dr. Rayann Heman who recommended AAD therapy with amiodarone and close follow-up as an outpatient. However, prior to discharge, her rate increased and her symptoms returned; therefore, she was started on IV amiodarone and admitted to telemetry. Her rate improved on IV amiodarone and she converted to SR. She was discharged 05/23/2013 on PO amiodarone and Eliquis. On 06/04/2013 she developed sudden onset worsening SOB and was admitted.   [place] 06/06/13: RA mean 10  RV 50/13  PA 49/21, mean 31  PCWP mean 21  Oxygen saturations:  PA 50%  AO 94%  Cardiac Output/Index (Fick) 3.4/1.9  PVR (Fick) 2.9 WU  Cardiac Output/Index (Thermodilution) 2.2/1.2  PVR (Themodilution) 4.5 WU  After cath started on milrinone 0.25 and Lasix 40 mg IV q 8hrs. PICC placed. Coox improved on milrinone, diuresed well.  Now on po Lasix and off milrinone.  CO-OX 61% off Milrinone.   Denies SOB/PND/Orthopnea. Wants to go home.    Objective:   Weight Range:  Vital Signs:   Temp:  [97.9 F (36.6 C)-98.6 F (37 C)] 98.6 F (37 C) (03/24 0627) Pulse Rate:  [50-58] 58 (03/24 0627) Resp:  [16-18] 18 (03/24 0627) BP: (88-117)/(45-63) 98/45 mmHg (03/24 0627) SpO2:  [94 %-100 %] 99 % (03/24 0627) Weight:  [139 lb 4.8 oz (63.186 kg)] 139 lb 4.8 oz (63.186  kg) (03/24 0627) Last BM Date: 06/10/13  Weight change: Filed Weights   06/09/13 0532 06/10/13 0700 06/11/13 0627  Weight: 142 lb (64.411 kg) 140 lb (63.504 kg) 139 lb 4.8 oz (63.186 kg)    Intake/Output:   Intake/Output Summary (Last 24 hours) at 06/11/13 0754 Last data filed at 06/11/13 1660  Gross per 24 hour  Intake    480 ml  Output   2000 ml  Net  -1520 ml     Physical Exam: General:  Well appearing. No resp difficulty; lying flat HEENT: normal Neck: supple. JVP flat . Carotids 2+ bilat; no bruits. No lymphadenopathy or thryomegaly appreciated. Cor: PMI nondisplaced. Regular rate & rhythm. No rubs, gallops or murmurs. Lungs: clear Abdomen: soft, nontender, nondistended. No hepatosplenomegaly. No bruits or masses. Good bowel sounds. Extremities: no cyanosis, clubbing, rash, edema Neuro: alert & orientedx3, cranial nerves grossly intact. moves all 4 extremities w/o difficulty. Affect pleasant  Telemetry: SB 50s  Labs: Basic Metabolic Panel:  Recent Labs Lab 06/07/13 0500 06/08/13 0556 06/09/13 0500 06/10/13 0500 06/11/13 0530  NA 137 138 135* 135* 136*  K 3.2* 3.8 4.1 3.7 3.8  CL 96 100 96 94* 94*  CO2 26 26 28 29 27   GLUCOSE 150* 98 98 97 88  BUN 17 16 16 17 21   CREATININE 1.20* 1.19* 1.17* 1.21* 1.24*  CALCIUM 8.1* 8.4 9.0 9.1 9.1    Liver Function Tests:  Recent Labs Lab 06/04/13 0957 06/11/13 0530  AST 72*  63*  ALT 27 23  ALKPHOS 60 41  BILITOT 0.5 0.6  PROT 7.4 6.7  ALBUMIN 3.4* 3.1*   No results found for this basename: LIPASE, AMYLASE,  in the last 168 hours No results found for this basename: AMMONIA,  in the last 168 hours  CBC:  Recent Labs Lab 06/04/13 0957  06/07/13 0500 06/08/13 0556 06/09/13 0500 06/10/13 0500 06/11/13 0530  WBC 6.5  < > 4.1 3.5* 3.4* 3.1* 3.8*  NEUTROABS 3.2  --   --   --   --   --   --   HGB 14.7  < > 11.1* 11.5* 11.4* 12.8 12.5  HCT 42.8  < > 31.7* 33.5* 32.3* 36.8 35.8*  MCV 104.1*  < > 101.0*  101.8* 100.9* 100.8* 99.4  PLT 215  < > 125* 129* 151 154 169  < > = values in this interval not displayed.  Cardiac Enzymes:  Recent Labs Lab 06/04/13 0957 06/04/13 2020 06/05/13 0124 06/05/13 0650  TROPONINI <0.30 <0.30 <0.30 <0.30    BNP: BNP (last 3 results)  Recent Labs  04/10/13 1557 04/22/13 1200 06/04/13 0957  PROBNP 3238.0* 754.3* 2458.0*      Imaging: No results found.   Medications:     Scheduled Medications: . amiodarone  400 mg Oral BID  . apixaban  5 mg Oral BID  . carvedilol  3.125 mg Oral BID WC  . digoxin  0.125 mg Oral Daily  . ezetimibe  10 mg Oral QHS  . folic acid-pyridoxine-cyancobalamin  1 tablet Oral Daily  . furosemide  40 mg Oral Daily  . hyoscyamine  0.375 mg Oral BID  . levothyroxine  200 mcg Oral QAC breakfast  . losartan  12.5 mg Oral Daily  . omega-3 acid ethyl esters  1 g Oral Daily  . raloxifene  60 mg Oral Daily  . sodium chloride  10-40 mL Intracatheter Q12H  . spironolactone  25 mg Oral Daily    Infusions: . sodium chloride 10 mL/hr at 06/07/13 0800    PRN Medications: acetaminophen, HYDROcodone-acetaminophen, nitroGLYCERIN, pantoprazole, polyethylene glycol, sodium chloride  Assessment Plan/Discussion:   1. Acute on chronic systolic CHF: Ischemic cardiomyopathy, low output on RHC with elevated filling pressures.  EF 20-25% on most recent echo.  CO-OX 61% off Milrinone. - Volume status stable. Continue lasix 40 mg daily. Continue digoxin, spironolactone, low doses of Coreg, losartan.  BP soft no room to titrate. 2. Atrial tachycardia/history of atrial fibrillation: Maintaining NSR on amiodarone.  She is on apixaban, dosed appropriately.  In discussion with EP have decided on holding off on ablation/Biv pacing for now with hope that full load of amiodarone will keep arrhythmias suppressed.  If not, ablation/pacing is an option.  Today will reduce amiodarone to 200 mg twice a day for 1 week followed by 200 mg daily for  maintenance  3. CKD: Stable renal function.   4. CAD: Stable.   F/U HF clinic 3/31 at 1:30. Home today with HH--[facility] Check LFTs, BMET and Pro BNP at follow up.   CLEGG,AMY NP-C   06/11/2013 7:54 AM  Patient seen with NP, agree with the above note.  Co-ox adequate off milrinone.  She appears euvolemic today and denies exertional dyspnea.  I agree that she can go home today.   She is holding NSR on amiodarone but she has had elevation in LFTs in the past.  This will need to be followed closely.   Home regimen: digoxin 0.625 mg daily, amiodarone 200  mg bid x 1 week then 200 mg daily, Coreg 3.125 mg bid, losartan 12.5 mg daily, apixaban 5 mg bid, spironolactone 25 mg daily, Lasix 40 mg daily, Zetia 10 daily.   Will need followup in 1 week with CHF clinic.  Will need labs drawn at followup: LFTs, TSH, BMET, BNP, digoxin level.  She will need followup with EP to discuss replacement of ICD, at this time not planning on AV nodal ablation/BiV pacing.   Loralie Champagne 06/11/2013 8:42 AM

## 2013-06-11 NOTE — Progress Notes (Signed)
Pt being dc to home with home health RN/PT, pt given dc instructions, pt verbalized understanding, pt left via wheelchair with family, pt stable

## 2013-06-11 NOTE — Plan of Care (Signed)
Problem: Phase I Progression Outcomes Goal: EF % per last Echo/documented,Core Reminder form on chart Outcome: Completed/Met Date Met:  06/11/13 EF - 20-25% as of January 2015

## 2013-06-11 NOTE — Discharge Summary (Signed)
Advanced Heart Failure Team  Discharge Summary   Patient ID: [name] MRN: [id number], DOB/AGE: 08/28/1950 63 y.o. Admit date: 06/04/2013 D/C date:     06/11/2013   Primary Discharge Diagnoses:  1. A/C Systolic Heart Failure  ECHO 04/08/13 EF 20-25%  2. Atrial tachycardia/history of atrial fibrillation on eliquis 5 mg daily 3. CKD- creatinine baseline 1.2  4. CAD   Hospital Course:  [name] is a 63 year old woman with an ICM (s/p ICD implantation in 2005; gen change 2006; dual chamber upgrade 2014 with subsequent extraction due to infection 11/2012), EF 20-25%, (04/08/13), CAD last intervention 2004, last cath 2012 (patent the LAD and circumflex stents), myoview 04/2013 with no ischemia), prior CVA, chronic kidney disease and atrial arrhythmias.  She was admitted with acute respiratory distress, hypotension, tachycardia, and lactic acidosis of 6.  EP consulted for Atrial Tach and at that time she had reverted back to NSR. AV node ablation and BIV considered however due to multiple medical problems amiodarone was thought to be best option. She was later taken to cath lab for [place] which showed elevated right/left filling pressures and low cardiac cath. Post cath she was placed on Milrinone and diuresed with IV lasix. As she continued to improved Milrinone was weaned with stable mixed venous sat of 61%. As her volume status improved she was transitioned from IV lasix to 40 mg po lasix daily.  She will be discharged on low dose carvedilol, digoxin, losartan, and spironolactone. Overall she diuresed 8 pounds.   She will continue to be followed closely in the HF Clinic with follow up next week. She has had elevated LFTs in the past will follow this closely. Plan to check LFTs, TSH, BMET, Pro BNP, and dig level. She will also follow up with Dr Caryl Comes April 6th to discuss replacement of ICD.       RHC 06/06/13:  RA mean 10  RV 50/13  PA 49/21, mean 31  PCWP mean 21  Oxygen saturations:  PA 50%   AO 94%  Cardiac Output/Index (Fick) 3.4/1.9  PVR (Fick) 2.9 WU  Cardiac Output/Index (Thermodilution) 2.2/1.2  PVR (Themodilution) 4.5 WU   Discharge Weight Range: Discharge Weight 139 pounds Discharge Vitals: Blood pressure 98/45, pulse 58, temperature 98.6 F (37 C), temperature source Oral, resp. rate 18, height 5\' 6"  (1.676 m), weight 139 lb 4.8 oz (63.186 kg), SpO2 99.00%.  Labs: Lab Results  Component Value Date   WBC 3.8* 06/11/2013   HGB 12.5 06/11/2013   HCT 35.8* 06/11/2013   MCV 99.4 06/11/2013   PLT 169 06/11/2013     Recent Labs Lab 06/11/13 0530  NA 136*  K 3.8  CL 94*  CO2 27  BUN 21  CREATININE 1.24*  CALCIUM 9.1  PROT 6.7  BILITOT 0.6  ALKPHOS 41  ALT 23  AST 63*  GLUCOSE 88   Lab Results  Component Value Date   CHOL 226* 04/22/2013   HDL 119 04/22/2013   LDLCALC 93 04/22/2013   TRIG 68 04/22/2013   BNP (last 3 results)  Recent Labs  04/10/13 1557 04/22/13 1200 06/04/13 0957  PROBNP 3238.0* 754.3* 2458.0*    Diagnostic Studies/Procedures   No results found.  Discharge Medications     Medication List    STOP taking these medications       aspirin EC 81 MG tablet     diltiazem 60 MG tablet  Commonly known as:  CARDIZEM      TAKE these  medications       amiodarone 200 MG tablet  Commonly known as:  PACERONE  Take 200 mg (1 tablet) twice daily for one week, then take 200 mg (1 tablet) once daily for maintenance therapy     carvedilol 6.25 MG tablet  Commonly known as:  COREG  Take 0.5 tablets (3.125 mg total) by mouth 2 (two) times daily with a meal.     Digoxin 62.5 MCG Tabs  Take 0.0625 mg by mouth daily.     ELIQUIS 5 MG Tabs tablet  Generic drug:  apixaban  Take 5 mg by mouth 2 (two) times daily.     ezetimibe 10 MG tablet  Commonly known as:  ZETIA  Take 10 mg by mouth at bedtime.     fexofenadine 180 MG tablet  Commonly known as:  ALLEGRA  Take 180 mg by mouth daily as needed for allergies.     fluticasone 50  MCG/ACT nasal spray  Commonly known as:  FLONASE  Place 2 sprays into both nostrils daily as needed for allergies.     folic acid-pyridoxine-cyancobalamin 2.5-25-2 MG Tabs  Commonly known as:  FOLTX  Take 1 tablet by mouth daily.     furosemide 40 MG tablet  Commonly known as:  LASIX  Take 1 tablet (40 mg total) by mouth daily.     hyoscyamine 0.375 MG 12 hr tablet  Commonly known as:  LEVBID  Take 1 tablet (0.375 mg total) by mouth 2 (two) times daily.     levothyroxine 200 MCG tablet  Commonly known as:  SYNTHROID, LEVOTHROID  Take 200 mcg by mouth daily before breakfast.     losartan 25 MG tablet  Commonly known as:  COZAAR  Take 0.5 tablets (12.5 mg total) by mouth daily.     nitroGLYCERIN 0.4 MG SL tablet  Commonly known as:  NITROSTAT  Place 0.4 mg under the tongue every 5 (five) minutes as needed for chest pain.     omega-3 acid ethyl esters 1 G capsule  Commonly known as:  LOVAZA  Take 1 g by mouth daily.     pantoprazole 40 MG tablet  Commonly known as:  PROTONIX  Take 40 mg by mouth 2 (two) times daily as needed (gerd).     polyethylene glycol packet  Commonly known as:  MIRALAX / GLYCOLAX  Take 17 g by mouth daily as needed (for constipation).     raloxifene 60 MG tablet  Commonly known as:  EVISTA  Take 60 mg by mouth daily.     spironolactone 25 MG tablet  Commonly known as:  ALDACTONE  Take 25 mg by mouth daily.        Disposition   The patient will be discharged in stable condition to home. Discharge Orders   Future Appointments Provider Department Dept Phone   06/18/2013 1:20 PM [place] [phone]   06/24/2013 3:15 PM Deboraha Sprang, MD [facility] [phone]   Future Orders Complete By Expires   ACE Inhibitor / ARB already ordered  As directed    Amb Referral to Cardiac Rehabilitation  As directed    Diet - low sodium heart healthy  As directed    Heart  Failure patients record your daily weight using the same scale at the same time of day  As directed    Increase activity slowly  As directed      Follow-up Information  Follow up with [facility]. (RN/PT)    Contact information:   [address] [place] [address] [phone]       Follow up with Loralie Champagne, MD On 06/18/2013. (at 1:30. Garage Code 3000)    Specialty:  Cardiology   Contact information:   [address] [place]  [place] [address] [phone]       Follow up with Virl Axe, MD On 06/24/2013. (at 3:15)    Specialty:  Cardiology   Contact information:   [address] [address] [phone]         Duration of Discharge Encounter: Greater than 35 minutes   Signed, Karinna Beadles NP-C  06/11/2013, 11:00 AM

## 2013-06-11 NOTE — Discharge Instructions (Signed)
Heart Failure Heart failure means your heart has trouble pumping blood. This makes it hard for your body to work well. Heart failure is usually a long-term (chronic) condition. You must take good care of yourself and follow your doctor's treatment plan. HOME CARE  Take your heart medicine as told by your doctor.  Do not stop taking medicine unless your doctor tells you to.  Do not skip any dose of medicine.  Refill your medicines before they run out.  Take other medicines only as told by your doctor or pharmacist.  Stay active if told by your doctor. The elderly and people with severe heart failure should talk with a doctor about physical activity.  Eat heart healthy foods. Choose foods that are without trans fat and are low in saturated fat, cholesterol, and salt (sodium). This includes fresh or frozen fruits and vegetables, fish, lean meats, fat-free or low-fat dairy foods, whole grains, and high-fiber foods. Lentils and dried peas and beans (legumes) are also good choices.  Limit salt if told by your doctor.  Cook in a healthy way. Roast, grill, broil, bake, poach, steam, or stir-fry foods.  Limit fluids as told by your doctor.  Weigh yourself every morning. Do this after you pee (urinate) and before you eat breakfast. Write down your weight to give to your doctor.  Take your blood pressure and write it down if your doctor tell you to.  Ask your doctor how to check your pulse. Check your pulse as told.  Lose weight if told by your doctor.  Stop smoking or chewing tobacco. Do not use gum or patches that help you quit without your doctor's approval.  Schedule and go to doctor visits as told.  Nonpregnant women should have no more than 1 drink a day. Men should have no more than 2 drinks a day. Talk to your doctor about drinking alcohol.  Stop illegal drug use.  Stay current with shots (immunizations).  Manage your health conditions as told by your doctor.  Learn to manage  your stress.  Rest when you are tired.  If it is really hot outside:  Avoid intense activities.  Use air conditioning or fans, or get in a cooler place.  Avoid caffeine and alcohol.  Wear loose-fitting, lightweight, and light-colored clothing.  If it is really cold outside:  Avoid intense activities.  Layer your clothing.  Wear mittens or gloves, a hat, and a scarf when going outside.  Avoid alcohol.  Learn about heart failure and get support as needed.  Get help to maintain or improve your quality of life and your ability to care for yourself as needed. GET HELP IF:   You gain 03 lb/1.4 kg or more in 1 day or 05 lb/2.3 kg in a week.  You are more short of breath than usual.  You cannot do your normal activities.  You tire easily.  You cough more than normal, especially with activity.  You have any or more puffiness (swelling) in areas such as your hands, feet, ankles, or belly (abdomen).  You cannot sleep because it is hard to breathe.  You feel like your heart is beating fast (palpitations).  You get dizzy or lightheaded when you stand up. GET HELP RIGHT AWAY IF:   You have trouble breathing.  There is a change in mental status, such as becoming less alert or not being able to focus.  You have chest pain or discomfort.  You faint. MAKE SURE YOU:   Understand these   instructions.  Will watch your condition.  Will get help right away if you are not doing well or get worse. Document Released: 12/15/2007 Document Revised: 07/02/2012 Document Reviewed: 10/06/2011 ExitCare Patient Information 2014 [employer].  

## 2013-06-11 NOTE — Progress Notes (Signed)
Physical Therapy Treatment Patient Details Name: DEANNA WIATER MRN: [id number] DOB: [birth year] Today's Date: 06/11/2013    History of Present Illness The patient reports sudden onset of dyspnea at 0900hrs this morning along with nausea.  She was coughing up Jan Olano foam.  She denies any weight gain recently, abd distention, LEE, fever, sick contacts since being discharged.[name]  Her weight is the same as it was on 05/22/13 at last discharge.  She was transported by EMS and became unresponsive.  They attempted intubation but was unsuccessful.  Bag mask was used. Hx of CAG, stents, CHF with EF 20%, spinal stenosis x 4 surgeries and left foot drop    PT Comments    Pt admitted with above. Pt currently with functional limitations due to balance and endurance deficits.  Pt will benefit from skilled PT to increase their independence and safety with mobility to allow discharge to the venue listed below.   Follow Up Recommendations  Home health PT     Equipment Recommendations  None recommended by PT    Recommendations for Other Services       Precautions / Restrictions Precautions Precautions: Fall Restrictions Weight Bearing Restrictions: No    Mobility  Bed Mobility Overal bed mobility: Modified Independent                Transfers Overall transfer level: Modified independent                  Ambulation/Gait Ambulation/Gait assistance: Supervision Ambulation Distance (Feet): 150 Feet Assistive device: None Gait Pattern/deviations: Step-through pattern;Decreased stride length   Gait velocity interpretation: at or above normal speed for age/gender General Gait Details: anterior lean with hip flexion.  Pt likes to hold hands behind back when ambulating.  Pt needs cues for upright posture.     Stairs            Wheelchair Mobility    Modified Rankin (Stroke Patients Only)       Balance Overall balance assessment: Needs assistance         Standing  balance support: No upper extremity supported;During functional activity Standing balance-Leahy Scale: Fair Standing balance comment: Pt steady overall with min challenges to balance.  Cannot tolerate moderate challenges.                      Cognition Arousal/Alertness: Awake/alert Behavior During Therapy: WFL for tasks assessed/performed Overall Cognitive Status: Within Functional Limits for tasks assessed                      Exercises General Exercises - Lower Extremity Ankle Circles/Pumps: AROM;Both;10 reps;Supine Quad Sets: AROM;5 reps;Both;Supine Long Arc Quad: AROM;Seated;Both;20 reps    General Comments        Pertinent Vitals/Pain VSS, No pain    Home Living                      Prior Function            PT Goals (current goals can now be found in the care plan section) Progress towards PT goals: Progressing toward goals    Frequency  Min 3X/week    PT Plan Current plan remains appropriate    End of Session Equipment Utilized During Treatment: Gait belt Activity Tolerance: Patient tolerated treatment well Patient left: in bed;with call bell/phone within reach     Time: 1017-1026 PT Time Calculation (min): 9 min  Charges:  $Gait Training: 8-22 mins  G Codes:      INGOLD,Jasin Brazel 06/11/2013, 3:07 PM [facility] Acute Rehabilitation [phone] (pager)

## 2013-06-13 NOTE — Telephone Encounter (Signed)
New message    Need verbal order for home health physical twice a week for 4 weeks .    Any parameters blood pressure dur ing exercise.

## 2013-06-18 ENCOUNTER — Ambulatory Visit (HOSPITAL_COMMUNITY)
Admission: RE | Admit: 2013-06-18 | Discharge: 2013-06-18 | Disposition: A | Discharge status: Home / Self Care | Payer: Medicare Other | Source: Ambulatory Visit | Attending: Internal Medicine | Admitting: Internal Medicine

## 2013-06-18 ENCOUNTER — Encounter (HOSPITAL_COMMUNITY): Payer: Self-pay

## 2013-06-18 ENCOUNTER — Telehealth (HOSPITAL_COMMUNITY): Payer: Self-pay | Admitting: Anesthesiology

## 2013-06-18 VITALS — BP 98/56 | HR 62 | Ht 66.0 in | Wt 143.0 lb

## 2013-06-18 DIAGNOSIS — I4891 Unspecified atrial fibrillation: Secondary | ICD-10-CM

## 2013-06-18 DIAGNOSIS — N189 Chronic kidney disease, unspecified: Secondary | ICD-10-CM

## 2013-06-18 DIAGNOSIS — I5042 Chronic combined systolic (congestive) and diastolic (congestive) heart failure: Secondary | ICD-10-CM | POA: Insufficient documentation

## 2013-06-18 DIAGNOSIS — I480 Paroxysmal atrial fibrillation: Secondary | ICD-10-CM

## 2013-06-18 DIAGNOSIS — I2589 Other forms of chronic ischemic heart disease: Secondary | ICD-10-CM | POA: Insufficient documentation

## 2013-06-18 DIAGNOSIS — I2510 Atherosclerotic heart disease of native coronary artery without angina pectoris: Secondary | ICD-10-CM | POA: Insufficient documentation

## 2013-06-18 DIAGNOSIS — I255 Ischemic cardiomyopathy: Secondary | ICD-10-CM

## 2013-06-18 LAB — COMPREHENSIVE METABOLIC PANEL
ALT: 23 U/L (ref 0–35)
AST: 51 U/L — ABNORMAL HIGH (ref 0–37)
Albumin: 3.6 g/dL (ref 3.5–5.2)
Alkaline Phosphatase: 48 U/L (ref 39–117)
BUN: 17 mg/dL (ref 6–23)
CHLORIDE: 102 meq/L (ref 96–112)
CO2: 22 mEq/L (ref 19–32)
CREATININE: 1.13 mg/dL — AB (ref 0.50–1.10)
Calcium: 9.4 mg/dL (ref 8.4–10.5)
GFR calc Af Amer: 59 mL/min — ABNORMAL LOW (ref >90)
GFR calc non Af Amer: 51 mL/min — ABNORMAL LOW (ref >90)
GLUCOSE: 110 mg/dL — AB (ref 70–99)
Potassium: 4.6 mEq/L (ref 3.7–5.3)
Sodium: 140 mEq/L (ref 137–147)
Total Bilirubin: 0.6 mg/dL (ref 0.3–1.2)
Total Protein: 7.5 g/dL (ref 6.0–8.3)

## 2013-06-18 LAB — PRO B NATRIURETIC PEPTIDE: PRO B NATRI PEPTIDE: 1223 pg/mL — AB (ref 0–125)

## 2013-06-18 LAB — TSH: TSH: 1.93 u[IU]/mL (ref 0.350–4.500)

## 2013-06-18 LAB — T4, FREE: Free T4: 2.51 ng/dL — ABNORMAL HIGH (ref 0.80–1.80)

## 2013-06-18 LAB — DIGOXIN LEVEL: DIGOXIN LVL: 1.2 ng/mL (ref 0.8–2.0)

## 2013-06-18 MED ORDER — DIGOXIN 125 MCG PO TABS: 0.0625 mg | ORAL_TABLET | Freq: Every day | ORAL | Status: DC

## 2013-06-18 NOTE — Progress Notes (Signed)
Patient ID: [name], female   DOB: April 11, 1950, 63 y.o.   MRN: [id number] PCP: Dr. [name] ([facility] on [place]) Primary Cardiologist: Dr. Tamala Julian EP: Dr. Caryl Comes and Dr Rayann Heman (atrial tachy)   HPI: [name] is a 63 year old woman with an ICM (s/p ICD implantation in 2005; gen change 2006; dual chamber upgrade 2014 with subsequent extraction due to infection 11/2012), EF 20-25%, CAD last intervention 2004, last cath 2012 (patent the LAD and circumflex stents), myoview 04/2013 with no ischemia), prior CVA, chronic kidney disease and atrial arrhythmias.  Admitted 3/17-3/24/15 for acute rerespiratory distress, hypotension, tachycardia, and lactic acidosis of 6. EP consulted for Atrial Tach and at that time she had reverted back to NSR. AV node ablation and BIV considered, however due to multiple medical problems amiodarone was thought to be best option. She was later taken to cath lab for [place] which showed elevated right/left filling pressures and low cardiac cath. Post cath she was placed on Milrinone and diuresed with IV lasix. D/C weight 139 lbs.  [facility] F/U: Since she has been home has been doing well. Has Genitva HHRN/PT currently. Denies SOB, orthopnea or CP. Notices palpitations occasionally. Mild DOE with going upstairs. Was able to walk from the garage to clinic today with no issues. Weight at home 140-143 lbs. Following a low salt diet and drinking less than 2L a day. Taking medications as prescribed. No dizziness. SBP at home 90s  ROS: All systems negative except as listed in HPI, PMH and Problem List.  Past Medical History  Diagnosis Date  . Spinal stenosis   . Numbness of foot     left foot  . Diverticulitis     a. s/p partial colectomy 1/12 with reversal in July 2012.  . Colitis, ischemic   . DJD (degenerative joint disease)     History of multiple surgeries to the back, shoulder and knee  . Ischemic cardiomyopathy     a. Echo 06/16/10: EF 25-30%, anteroseptal and apical  hypokinesis, moderate AI, mild MR.  . Chronic systolic heart failure   . CAD (coronary artery disease)     a.  Ant MI 8/03 with stenting of the LAD;  b. staged PCI with Cypher DES to OM1 8/03;  c. s/p Cypher DES to LAD 7/04;  d.  multiple cardiac caths in past (chronically abnl ECG);    e. cardiac catheterization 3/12: LAD stent patent, distal LAD 40-50%, small ostial D1 80%, ostial D2 60%, OM-1 stent patent (20-30%), proximal RCA 50%, proximal to mid RCA 40-50%; f. cath 02/17/2011 - nonobs  . LV (left ventricular) mural thrombus   . CKD (chronic kidney disease), stage III     creatinine:  1.3 in 4/12;    . Tobacco abuse     history  . GERD (gastroesophageal reflux disease)   . Hypertension   . Lower GI bleed     August 2011  . Hypothyroidism   . Aortic insufficiency   . Pseudoaneurysm     History of, right forearm  . Hyperlipidemia   . Compression fracture 07/04/11    L2  . 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 PAF (paroxysmal atrial fibrillation)   . Anatomical narrow angle of right eye   . Blood transfusion     "must have been when I had the total knee replacement" (04/10/2013)  . Anemia   .  H/O hiatal hernia   . Sinus bradycardia 03/05/2012  . Paroxysmal atrial flutter   . Mitral regurgitation   . Heart murmur   . CHF (congestive heart failure)     "3-4 times" (04/10/2013)  . Myocardial infarction 2002  . Stroke     History of TIA and possible CVA; hospitalized 2004; on coumadin  . Stroke     "I've had 3"; denies residual on 04/10/2013  . Chronic lower back pain     Current Outpatient Prescriptions  Medication Sig Dispense Refill  . amiodarone (PACERONE) 200 MG tablet Take 200 mg by mouth daily.      [name] apixaban (ELIQUIS) 5 MG TABS tablet Take 5 mg by mouth 2 (two) times daily.      . carvedilol (COREG) 6.25 MG tablet Take 0.5 tablets (3.125 mg total) by mouth  2 (two) times daily with a meal.  60 tablet  6  . digoxin 62.5 MCG TABS Take 0.0625 mg by mouth daily.  30 tablet  3  . ezetimibe (ZETIA) 10 MG tablet Take 10 mg by mouth at bedtime.       . fexofenadine (ALLEGRA) 180 MG tablet Take 180 mg by mouth daily as needed for allergies.       . fluticasone (FLONASE) 50 MCG/ACT nasal spray Place 2 sprays into both nostrils daily as needed for allergies.       . folic acid-pyridoxine-cyancobalamin (FOLTX) 2.5-25-2 MG TABS Take 1 tablet by mouth daily.       . furosemide (LASIX) 40 MG tablet Take 1 tablet (40 mg total) by mouth daily.  30 tablet  6  . hyoscyamine (LEVBID) 0.375 MG 12 hr tablet Take 1 tablet (0.375 mg total) by mouth 2 (two) times daily.  60 tablet  5  . levothyroxine (SYNTHROID, LEVOTHROID) 200 MCG tablet Take 200 mcg by mouth daily before breakfast.       . losartan (COZAAR) 25 MG tablet Take 0.5 tablets (12.5 mg total) by mouth daily.  30 tablet  6  . nitroGLYCERIN (NITROSTAT) 0.4 MG SL tablet Place 0.4 mg under the tongue every 5 (five) minutes as needed for chest pain.       [name] omega-3 acid ethyl esters (LOVAZA) 1 G capsule Take 1 g by mouth daily.      . pantoprazole (PROTONIX) 40 MG tablet Take 40 mg by mouth 2 (two) times daily as needed (gerd).       . polyethylene glycol (MIRALAX / GLYCOLAX) packet Take 17 g by mouth daily as needed (for constipation).       . raloxifene (EVISTA) 60 MG tablet Take 60 mg by mouth daily.      [name] spironolactone (ALDACTONE) 25 MG tablet Take 25 mg by mouth daily.       No current facility-administered medications for this encounter.    Filed Vitals:   06/18/13 1324  BP: 98/56  Pulse: 62  Height: 5\' 6"  (1.676 m)  Weight: 143 lb (64.864 kg)  SpO2: 99%    PHYSICAL EXAM: General:  Well appearing. No resp difficulty; husband present HEENT: normal Neck: supple. JVP flat. Carotids 2+ bilaterally; no bruits. No lymphadenopathy or thryomegaly appreciated. Cor: PMI normal. Regular rate & rhythm. No  rubs, gallops or murmurs. Lungs: clear Abdomen: soft, nontender, nondistended. No hepatosplenomegaly. No bruits or masses. Good bowel sounds. Extremities: no cyanosis, clubbing, rash, edema Neuro: alert & orientedx3, cranial nerves grossly intact. Moves all 4 extremities w/o difficulty. Affect pleasant.  EKG: SB 54  bpm   ASSESSMENT & PLAN:  1) Chronic systolic HF: ICM, EF 92-92% (03/2013) - Ms. Min is a new patient to the HF clinic who was recently admitted for A/C HF in the setting of atrial tach/afib. Converted on own back to NSR. - She previously had ICD, however it was extracted in 11/2012 d/t infection. Her EF remains less than 35%. She is to follow up with Dr. Caryl Comes on 06/24/13 to discuss possible EPS +RF ablation vs CRT-D implant +AV node ablation. She will keep appointment. Would prefer Medtronic device in order to monitor HF hemodynamics. - NYHA II-III symptoms and volume status stable. Will continue lasix 40 mg daily. - Will not titrate any medications with low SBP 90s. Continue coreg 3.125 mg BID, losartan 12.5 mg daily, spiro 25 mg daily and digoxin 0.0625 mg daily. Will check BMET, dig level and pro-BNP today. - Reinforced the need and importance of daily weights, a low sodium diet, and fluid restriction (less than 2 L a day). Instructed to call the HF clinic if weight increases more than 3 lbs overnight or 5 lbs in a week.  2) Atrial Tachycardia - On amiodarone and EKG shows SB 54 bpm. She reports she still has some palpitations occasionally and not always associated with movement. As above has appt with EP next week to discuss EPS +RF ablation vs CRT-D implant +AV node ablation. Continue amiodarone and eliquis. Chest TSH and LFTs today. She is aware to get yearly eye exams on amiodarone. 3) CAD - No s/s of ischemia. Continue medical management 4) CKD stage III - Baseline Cr 1.2. Will check today.  F/U 3 weeks Junie Bame B NP-C 2:01 PM

## 2013-06-18 NOTE — Patient Instructions (Signed)
Follow up in 3 weeks.  Doing great.  Call any issues [phone]  Have a wonderful Easter.  Do the following things EVERYDAY: 1) Weigh yourself in the morning before breakfast. Write it down and keep it in a log. 2) Take your medicines as prescribed 3) Eat low salt foods-Limit salt (sodium) to 2000 mg per day.  4) Stay as active as you can everyday 5) Limit all fluids for the day to less than 2 liters 6)

## 2013-06-18 NOTE — Telephone Encounter (Signed)
Patient called back to clarify dosage. She has the 0.125 tablets and has been taking 1 daily, have asked her to take 1/2 tablet 0.0625 daily. She is aware and sent new rx to pharmacy.  Junie Bame B NP-C 4:37 PM

## 2013-06-18 NOTE — Telephone Encounter (Signed)
Called pt about lab results. Her digoxin level is elevated and we need to cut her dose back to 0.0625 every other day. Please clarify her dosage of pill and change in computer when she calls back.  Junie Bame B NP-C 4:31 PM

## 2013-06-19 NOTE — Telephone Encounter (Signed)
Left message on personal voicemail to refer order for [facility] to heart failure clinic and left their number on voicemail.

## 2013-06-19 NOTE — Telephone Encounter (Signed)
New problem   Need to know if the fax was received for prescription renewal for life vest. Please call.

## 2013-06-24 ENCOUNTER — Encounter: Payer: Self-pay | Admitting: Internal Medicine

## 2013-06-24 ENCOUNTER — Ambulatory Visit (INDEPENDENT_AMBULATORY_CARE_PROVIDER_SITE_OTHER): Payer: Medicare Other | Admitting: Internal Medicine

## 2013-06-24 VITALS — BP 143/77 | HR 59 | Ht 66.0 in | Wt 143.0 lb

## 2013-06-24 DIAGNOSIS — I255 Ischemic cardiomyopathy: Secondary | ICD-10-CM

## 2013-06-24 DIAGNOSIS — I498 Other specified cardiac arrhythmias: Secondary | ICD-10-CM

## 2013-06-24 DIAGNOSIS — I2510 Atherosclerotic heart disease of native coronary artery without angina pectoris: Secondary | ICD-10-CM

## 2013-06-24 DIAGNOSIS — I4891 Unspecified atrial fibrillation: Secondary | ICD-10-CM

## 2013-06-24 DIAGNOSIS — Z01812 Encounter for preprocedural laboratory examination: Secondary | ICD-10-CM

## 2013-06-24 DIAGNOSIS — I471 Supraventricular tachycardia: Secondary | ICD-10-CM

## 2013-06-24 DIAGNOSIS — I2589 Other forms of chronic ischemic heart disease: Secondary | ICD-10-CM

## 2013-06-24 DIAGNOSIS — I480 Paroxysmal atrial fibrillation: Secondary | ICD-10-CM

## 2013-06-24 DIAGNOSIS — I5042 Chronic combined systolic (congestive) and diastolic (congestive) heart failure: Secondary | ICD-10-CM

## 2013-06-24 NOTE — Progress Notes (Signed)
Patient Care Team: Janith Lima, MD as PCP - General Minus Breeding, MD (Cardiology)   HPI  [name] is a 63 y.o. female Seen in followup for an ICD implanted for primary prevention and for which she underwent device revision in 2006. She underwent repeat operation by Dr. Greggory Brandy at [facility] and presented with chronic smoldering infection in September 2014 She underwent device extraction September 2014 And was discharged on a LifeVest   Echocardiogram 2012 demonstrated significant left ventricular dysfunction with an EF of 25%.   She was hospitalized 3/15 with A/C CHF attributed in part to atrial tachy which was ultimately treated with amiodarone, after AV ablation/device implantation and primary ablation were considered, but pt and MDs were in favor of amio therapy   In  the past amio has been assoc with hepatotoxicity ; but with its discontinuation, the LFT abnormalities persisted   They were rechecked 3 weeks post initiation and were stable  She is feeling pretty welll  Past Medical History  Diagnosis Date  . Spinal stenosis   . Numbness of foot     left foot  . Diverticulitis     a. s/p partial colectomy 1/12 with reversal in July 2012.  . Colitis, ischemic   . DJD (degenerative joint disease)     History of multiple surgeries to the back, shoulder and knee  . Ischemic cardiomyopathy     a. Echo 06/16/10: EF 25-30%, anteroseptal and apical hypokinesis, moderate AI, mild MR.  . Chronic systolic heart failure   . CAD (coronary artery disease)     a.  Ant MI 8/03 with stenting of the LAD;  b. staged PCI with Cypher DES to OM1 8/03;  c. s/p Cypher DES to LAD 7/04;  d.  multiple cardiac caths in past (chronically abnl ECG);    e. cardiac catheterization 3/12: LAD stent patent, distal LAD 40-50%, small ostial D1 80%, ostial D2 60%, OM-1 stent patent (20-30%), proximal RCA 50%, proximal to mid RCA 40-50%; f. cath 02/17/2011 - nonobs  . LV (left ventricular) mural thrombus   .  CKD (chronic kidney disease), stage III     creatinine:  1.3 in 4/12;    . Tobacco abuse     history  . GERD (gastroesophageal reflux disease)   . Hypertension   . Lower GI bleed     August 2011  . Hypothyroidism   . Aortic insufficiency   . Pseudoaneurysm     History of, right forearm  . Hyperlipidemia   . Compression fracture 07/04/11    L2  . 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 PAF (paroxysmal atrial fibrillation)   . Anatomical narrow angle of right eye   . Blood transfusion     "must have been when I had the total knee replacement" (04/10/2013)  . Anemia   . H/O hiatal hernia   . Sinus bradycardia 03/05/2012  . Paroxysmal atrial flutter   . Mitral regurgitation   . Heart murmur   . CHF (congestive heart failure)     "3-4 times" (04/10/2013)  . Myocardial infarction 2002  . Stroke     History of TIA and possible CVA; hospitalized 2004; on coumadin  . Stroke     "I've had 3"; denies residual on 04/10/2013  . Chronic lower back pain     Past Surgical History  Procedure Laterality  Date  . Back surgery    . Cardiac defibrillator placement  07/2003; 10/2004; 2014  . Lumbar laminectomy/decompression microdiscectomy  10/2001    L5-S1/E-chart  . Knee arthroscopy Right   . Thyroidectomy  1970's  . Total knee arthroplasty Left 11/2003  . X-stop implantation  12/2004    L3-4; L4-5  . Fixation kyphoplasty thoracic spine  07/2007    T3, 4, 6 compression fractures  . Shoulder open rotator cuff repair Right 04/2008  . Lumbar laminectomy/decompression microdiscectomy  01/2010  . Colectomy  03/2010    sigmoid left; transverse  . Peripherally inserted central catheter insertion  03/2010    removed upon discharge  . Colostomy reversal  12/2010  . Tonsillectomy and adenoidectomy  1969  . Appendectomy  03/2010  . Glaucoma surgery Right   . Biv icd genertaor change out   12/17/2012    "right now I don't have a defibrillator; I've been using an external one" (06/04/2013)  . Icd lead removal Left 12/17/2012    Procedure: ICD LEAD REMOVAL;  Surgeon: Evans Lance, MD;  Location: [facility];  Service: Cardiovascular;  Laterality: Left;  . Coronary angioplasty with stent placement  10/2001; 09/2002; ?date    "I've got a total of 4" (04/10/2013)    Current Outpatient Prescriptions  Medication Sig Dispense Refill  . amiodarone (PACERONE) 200 MG tablet Take 200 mg by mouth daily.      [name] apixaban (ELIQUIS) 5 MG TABS tablet Take 5 mg by mouth 2 (two) times daily.      . carvedilol (COREG) 6.25 MG tablet Take 0.5 tablets (3.125 mg total) by mouth 2 (two) times daily with a meal.  60 tablet  6  . digoxin (LANOXIN) 0.125 MG tablet Take 0.5 tablets (0.0625 mg total) by mouth daily.  45 tablet  3  . ezetimibe (ZETIA) 10 MG tablet Take 10 mg by mouth at bedtime.       . fexofenadine (ALLEGRA) 180 MG tablet Take 180 mg by mouth daily as needed for allergies.       . fluticasone (FLONASE) 50 MCG/ACT nasal spray Place 2 sprays into both nostrils daily as needed for allergies.       . folic acid-pyridoxine-cyancobalamin (FOLTX) 2.5-25-2 MG TABS Take 1 tablet by mouth daily.       . furosemide (LASIX) 40 MG tablet Take 1 tablet (40 mg total) by mouth daily.  30 tablet  6  . hyoscyamine (LEVBID) 0.375 MG 12 hr tablet Take 1 tablet (0.375 mg total) by mouth 2 (two) times daily.  60 tablet  5  . levothyroxine (SYNTHROID, LEVOTHROID) 200 MCG tablet Take 200 mcg by mouth daily before breakfast.       . losartan (COZAAR) 25 MG tablet Take 0.5 tablets (12.5 mg total) by mouth daily.  30 tablet  6  . nitroGLYCERIN (NITROSTAT) 0.4 MG SL tablet Place 0.4 mg under the tongue every 5 (five) minutes as needed for chest pain.       [name] omega-3 acid ethyl esters (LOVAZA) 1 G capsule Take 1 g by mouth daily.      . pantoprazole (PROTONIX) 40 MG tablet Take 40 mg by mouth 2 (two) times daily as needed  (gerd).       . polyethylene glycol (MIRALAX / GLYCOLAX) packet Take 17 g by mouth daily as needed (for constipation).       . raloxifene (EVISTA) 60 MG tablet Take 60 mg by mouth daily.      [name]  spironolactone (ALDACTONE) 25 MG tablet Take 25 mg by mouth daily.       No current facility-administered medications for this visit.    Allergies  Allergen Reactions  . Coconut Oil Anaphylaxis and Hives  . Lansoprazole Nausea Only  . Penicillins Swelling    Throat swells  . Statins Other (See Comments)    cramps  . Lisinopril     Cough- but still tolerates it  . Lactose Intolerance (Gi) Diarrhea and Other (See Comments)    Patient reports abdominal cramping and diarrhea with lactose products.     Review of Systems negative except from HPI and PMH  Physical Exam BP 143/77  Pulse 59  Ht 5\' 6"  (1.676 m)  Wt 143 lb (64.864 kg)  BMI 23.09 kg/m2 Well developed and well nourished in no acute distress HENT normal E scleral and icterus clear Neck Supple JVP flat; carotids brisk and full Clear to ausculation  Regular rate and rhythm, no murmurs gallops or rub Soft with active bowel sounds No clubbing cyanosis  Edema Alert and oriented, grossly normal motor and sensory function Skin Warm and Dry  ECG NSR 59 17/09/45  Assessment and  Plan  Atrial Tach  Nonischemic Cardiomyopathy  CHF-chronic systolic  Transaminase elevation  She is doing quite well currently on her amiodarone. We will continue it for now. We have discussed having her see somebody else considering ablation. I was to see Dr. Adrian Prows to consider primary ablation. In the interim, we will also anticipate undertaking CRT D. Implantation with the anticipation of possible AV junction ablation.  We discussed these options for aobut 45 minutes

## 2013-06-24 NOTE — Patient Instructions (Addendum)
Your physician recommends that you continue on your current medications as directed. Please refer to the Current Medication list given to you today.  CRT or cardiac resynchronization therapy is a treatment used to correct heart failure. When you have heart failure your heart is weakened and doesn't pump as well as it should. This therapy may help reduce symptoms and improve the quality of life.  Please see the handout/brochure given to you today to get more information of the different options of therapy.  Please call Aidian Salomon, RN at [phone] with decision.   Following dates are available: 4/29, 5/4, 5/13, 5/14, 5/18  You have been referred to Adrian Prows, cardiologist

## 2013-06-27 ENCOUNTER — Telehealth: Payer: Self-pay | Admitting: Internal Medicine

## 2013-06-27 NOTE — Telephone Encounter (Signed)
Elmyra Ricks with [facility] [employer] is calling to request a verbal okay to re-certify nursing services with a frequency of 2x a week for 8 weeks. She can be reached back at [phone].

## 2013-06-28 NOTE — Telephone Encounter (Signed)
Elmyra Ricks notified per verbal orders.

## 2013-07-02 ENCOUNTER — Telehealth: Payer: Self-pay | Admitting: Internal Medicine

## 2013-07-02 NOTE — Telephone Encounter (Signed)
New Problem:  PT states she is returning a call to [facility] regarding her surgery dates in May.

## 2013-07-03 NOTE — Telephone Encounter (Signed)
Follow up ° ° ° ° ° ° °Returning [name]'s call °

## 2013-07-03 NOTE — Telephone Encounter (Signed)
Left message to call back to schedule CRT-D.

## 2013-07-04 ENCOUNTER — Encounter: Payer: Self-pay | Admitting: *Deleted

## 2013-07-04 ENCOUNTER — Telehealth (HOSPITAL_COMMUNITY): Payer: Self-pay | Admitting: Cardiology

## 2013-07-04 NOTE — Telephone Encounter (Signed)
Scheduled CRT-D implant for 5/14, pre procedure labs 5/7, wound check 5/27. Instructions reviewed with pt, letter of instructions mailed to home address. Patient verbalized understanding and agreeable to plan.

## 2013-07-04 NOTE — Telephone Encounter (Signed)
CALLED TO REQUEST B/P, HR PARAMETERS FOR IN HOME PT

## 2013-07-04 NOTE — Telephone Encounter (Signed)
Will discuss w/MD

## 2013-07-06 ENCOUNTER — Emergency Department (HOSPITAL_COMMUNITY): Payer: Medicare Other

## 2013-07-06 ENCOUNTER — Inpatient Hospital Stay (HOSPITAL_COMMUNITY)
Admission: EM | Admit: 2013-07-06 | Discharge: 2013-07-08 | DRG: 281 | Disposition: A | Discharge status: Home / Self Care | Payer: Medicare Other | Attending: Cardiovascular Disease | Admitting: Cardiovascular Disease

## 2013-07-06 ENCOUNTER — Encounter (HOSPITAL_COMMUNITY): Payer: Self-pay | Admitting: Emergency Medicine

## 2013-07-06 ENCOUNTER — Encounter (HOSPITAL_COMMUNITY): Payer: Self-pay | Admitting: Anesthesiology

## 2013-07-06 ENCOUNTER — Encounter (HOSPITAL_COMMUNITY)
Admission: EM | Disposition: A | Discharge status: Home / Self Care | Payer: Medicare Other | Source: Home / Self Care | Attending: Cardiovascular Disease

## 2013-07-06 DIAGNOSIS — I471 Supraventricular tachycardia: Secondary | ICD-10-CM

## 2013-07-06 DIAGNOSIS — I5042 Chronic combined systolic (congestive) and diastolic (congestive) heart failure: Secondary | ICD-10-CM | POA: Diagnosis present

## 2013-07-06 DIAGNOSIS — I2584 Coronary atherosclerosis due to calcified coronary lesion: Secondary | ICD-10-CM | POA: Diagnosis present

## 2013-07-06 DIAGNOSIS — I252 Old myocardial infarction: Secondary | ICD-10-CM

## 2013-07-06 DIAGNOSIS — Z7901 Long term (current) use of anticoagulants: Secondary | ICD-10-CM

## 2013-07-06 DIAGNOSIS — I480 Paroxysmal atrial fibrillation: Secondary | ICD-10-CM | POA: Diagnosis present

## 2013-07-06 DIAGNOSIS — I498 Other specified cardiac arrhythmias: Secondary | ICD-10-CM | POA: Diagnosis present

## 2013-07-06 DIAGNOSIS — E039 Hypothyroidism, unspecified: Secondary | ICD-10-CM | POA: Diagnosis present

## 2013-07-06 DIAGNOSIS — Z96659 Presence of unspecified artificial knee joint: Secondary | ICD-10-CM

## 2013-07-06 DIAGNOSIS — Z9861 Coronary angioplasty status: Secondary | ICD-10-CM

## 2013-07-06 DIAGNOSIS — I519 Heart disease, unspecified: Secondary | ICD-10-CM

## 2013-07-06 DIAGNOSIS — M1990 Unspecified osteoarthritis, unspecified site: Secondary | ICD-10-CM | POA: Diagnosis present

## 2013-07-06 DIAGNOSIS — I4891 Unspecified atrial fibrillation: Secondary | ICD-10-CM | POA: Diagnosis present

## 2013-07-06 DIAGNOSIS — I2510 Atherosclerotic heart disease of native coronary artery without angina pectoris: Secondary | ICD-10-CM | POA: Diagnosis present

## 2013-07-06 DIAGNOSIS — I255 Ischemic cardiomyopathy: Secondary | ICD-10-CM

## 2013-07-06 DIAGNOSIS — K219 Gastro-esophageal reflux disease without esophagitis: Secondary | ICD-10-CM

## 2013-07-06 DIAGNOSIS — Z95810 Presence of automatic (implantable) cardiac defibrillator: Secondary | ICD-10-CM

## 2013-07-06 DIAGNOSIS — Z8673 Personal history of transient ischemic attack (TIA), and cerebral infarction without residual deficits: Secondary | ICD-10-CM

## 2013-07-06 DIAGNOSIS — N183 Chronic kidney disease, stage 3 unspecified: Secondary | ICD-10-CM | POA: Diagnosis present

## 2013-07-06 DIAGNOSIS — R519 Headache, unspecified: Secondary | ICD-10-CM

## 2013-07-06 DIAGNOSIS — I213 ST elevation (STEMI) myocardial infarction of unspecified site: Secondary | ICD-10-CM

## 2013-07-06 DIAGNOSIS — I2589 Other forms of chronic ischemic heart disease: Secondary | ICD-10-CM | POA: Diagnosis present

## 2013-07-06 DIAGNOSIS — I639 Cerebral infarction, unspecified: Secondary | ICD-10-CM

## 2013-07-06 DIAGNOSIS — I10 Essential (primary) hypertension: Secondary | ICD-10-CM

## 2013-07-06 DIAGNOSIS — R079 Chest pain, unspecified: Secondary | ICD-10-CM

## 2013-07-06 DIAGNOSIS — I2119 ST elevation (STEMI) myocardial infarction involving other coronary artery of inferior wall: Principal | ICD-10-CM | POA: Diagnosis present

## 2013-07-06 DIAGNOSIS — I129 Hypertensive chronic kidney disease with stage 1 through stage 4 chronic kidney disease, or unspecified chronic kidney disease: Secondary | ICD-10-CM | POA: Diagnosis present

## 2013-07-06 DIAGNOSIS — H538 Other visual disturbances: Secondary | ICD-10-CM | POA: Diagnosis present

## 2013-07-06 DIAGNOSIS — I5189 Other ill-defined heart diseases: Secondary | ICD-10-CM | POA: Diagnosis present

## 2013-07-06 DIAGNOSIS — E785 Hyperlipidemia, unspecified: Secondary | ICD-10-CM | POA: Diagnosis present

## 2013-07-06 DIAGNOSIS — Z87891 Personal history of nicotine dependence: Secondary | ICD-10-CM

## 2013-07-06 DIAGNOSIS — R51 Headache: Secondary | ICD-10-CM | POA: Diagnosis present

## 2013-07-06 DIAGNOSIS — N189 Chronic kidney disease, unspecified: Secondary | ICD-10-CM | POA: Diagnosis present

## 2013-07-06 DIAGNOSIS — I5022 Chronic systolic (congestive) heart failure: Secondary | ICD-10-CM

## 2013-07-06 DIAGNOSIS — I119 Hypertensive heart disease without heart failure: Secondary | ICD-10-CM | POA: Diagnosis present

## 2013-07-06 DIAGNOSIS — I2109 ST elevation (STEMI) myocardial infarction involving other coronary artery of anterior wall: Secondary | ICD-10-CM | POA: Diagnosis present

## 2013-07-06 DIAGNOSIS — Z79899 Other long term (current) drug therapy: Secondary | ICD-10-CM

## 2013-07-06 DIAGNOSIS — I509 Heart failure, unspecified: Secondary | ICD-10-CM | POA: Diagnosis present

## 2013-07-06 HISTORY — PX: LEFT HEART CATHETERIZATION WITH CORONARY ANGIOGRAM: SHX5451

## 2013-07-06 HISTORY — DX: Personal history of transient ischemic attack (TIA), and cerebral infarction without residual deficits: Z86.73

## 2013-07-06 LAB — CBC WITH DIFFERENTIAL/PLATELET
BASOS ABS: 0 10*3/uL (ref 0.0–0.1)
Basophils Relative: 0 % (ref 0–1)
EOS ABS: 0.1 10*3/uL (ref 0.0–0.7)
EOS PCT: 2 % (ref 0–5)
HCT: 43.2 % (ref 36.0–46.0)
Hemoglobin: 15.3 g/dL — ABNORMAL HIGH (ref 12.0–15.0)
LYMPHS ABS: 1.2 10*3/uL (ref 0.7–4.0)
Lymphocytes Relative: 38 % (ref 12–46)
MCH: 35.7 pg — AB (ref 26.0–34.0)
MCHC: 35.4 g/dL (ref 30.0–36.0)
MCV: 100.9 fL — AB (ref 78.0–100.0)
MONO ABS: 0.8 10*3/uL (ref 0.1–1.0)
Monocytes Relative: 24 % — ABNORMAL HIGH (ref 3–12)
Neutro Abs: 1.2 10*3/uL — ABNORMAL LOW (ref 1.7–7.7)
Neutrophils Relative %: 36 % — ABNORMAL LOW (ref 43–77)
PLATELETS: 141 10*3/uL — AB (ref 150–400)
RBC: 4.28 MIL/uL (ref 3.87–5.11)
RDW: 14.7 % (ref 11.5–15.5)
WBC: 3.2 10*3/uL — ABNORMAL LOW (ref 4.0–10.5)

## 2013-07-06 LAB — I-STAT CHEM 8, ED
BUN: 16 mg/dL (ref 6–23)
CALCIUM ION: 1.05 mmol/L — AB (ref 1.13–1.30)
CREATININE: 1.4 mg/dL — AB (ref 0.50–1.10)
Chloride: 104 mEq/L (ref 96–112)
Glucose, Bld: 132 mg/dL — ABNORMAL HIGH (ref 70–99)
HCT: 48 % — ABNORMAL HIGH (ref 36.0–46.0)
HEMOGLOBIN: 16.3 g/dL — AB (ref 12.0–15.0)
Potassium: 4.3 mEq/L (ref 3.7–5.3)
SODIUM: 138 meq/L (ref 137–147)
TCO2: 22 mmol/L (ref 0–100)

## 2013-07-06 LAB — APTT: APTT: 28 s (ref 24–37)

## 2013-07-06 LAB — DIGOXIN LEVEL: Digoxin Level: 0.7 ng/mL — ABNORMAL LOW (ref 0.8–2.0)

## 2013-07-06 LAB — I-STAT TROPONIN, ED: Troponin i, poc: 0.01 ng/mL (ref 0.00–0.08)

## 2013-07-06 LAB — PROTIME-INR
INR: 1.08 (ref 0.00–1.49)
PROTHROMBIN TIME: 13.8 s (ref 11.6–15.2)

## 2013-07-06 SURGERY — LEFT HEART CATHETERIZATION WITH CORONARY ANGIOGRAM
Anesthesia: General | Laterality: Bilateral

## 2013-07-06 MED ORDER — NITROGLYCERIN 0.4 MG SL SUBL
0.4000 mg | SUBLINGUAL_TABLET | SUBLINGUAL | Status: DC | PRN
  Administered 2013-07-06: 0.4 mg via SUBLINGUAL

## 2013-07-06 MED ORDER — APIXABAN 5 MG PO TABS
5.0000 mg | ORAL_TABLET | Freq: Two times a day (BID) | ORAL | Status: DC
  Administered 2013-07-07 – 2013-07-08 (×3): 5 mg via ORAL
  Filled 2013-07-06 (×4): qty 1

## 2013-07-06 MED ORDER — OMEGA-3-ACID ETHYL ESTERS 1 G PO CAPS
1.0000 g | ORAL_CAPSULE | Freq: Every day | ORAL | Status: DC
  Administered 2013-07-07 – 2013-07-08 (×2): 1 g via ORAL
  Filled 2013-07-06 (×2): qty 1

## 2013-07-06 MED ORDER — RALOXIFENE HCL 60 MG PO TABS
60.0000 mg | ORAL_TABLET | Freq: Every day | ORAL | Status: DC
  Administered 2013-07-07 – 2013-07-08 (×2): 60 mg via ORAL
  Filled 2013-07-06 (×2): qty 1

## 2013-07-06 MED ORDER — SODIUM CHLORIDE 0.9 % IV SOLN: 250.0000 mL | INTRAVENOUS | Status: DC | PRN

## 2013-07-06 MED ORDER — FLUTICASONE PROPIONATE 50 MCG/ACT NA SUSP: 2.0000 | Freq: Every day | NASAL | Status: DC | PRN

## 2013-07-06 MED ORDER — VERAPAMIL HCL 2.5 MG/ML IV SOLN
INTRAVENOUS | Status: AC
  Filled 2013-07-06: qty 2

## 2013-07-06 MED ORDER — ALUM & MAG HYDROXIDE-SIMETH 200-200-20 MG/5ML PO SUSP
30.0000 mL | ORAL | Status: AC | PRN
  Administered 2013-07-06 – 2013-07-07 (×2): 30 mL via ORAL
  Filled 2013-07-06 (×2): qty 30

## 2013-07-06 MED ORDER — CARVEDILOL 3.125 MG PO TABS
3.1250 mg | ORAL_TABLET | Freq: Two times a day (BID) | ORAL | Status: DC
  Administered 2013-07-07 – 2013-07-08 (×3): 3.125 mg via ORAL
  Filled 2013-07-06 (×7): qty 1

## 2013-07-06 MED ORDER — SODIUM CHLORIDE 0.9 % IJ SOLN: 3.0000 mL | INTRAMUSCULAR | Status: DC | PRN

## 2013-07-06 MED ORDER — POLYETHYLENE GLYCOL 3350 17 G PO PACK
17.0000 g | PACK | Freq: Every day | ORAL | Status: DC | PRN
  Filled 2013-07-06: qty 1

## 2013-07-06 MED ORDER — HEPARIN (PORCINE) IN NACL 2-0.9 UNIT/ML-% IJ SOLN
INTRAMUSCULAR | Status: AC
  Filled 2013-07-06: qty 500

## 2013-07-06 MED ORDER — FA-PYRIDOXINE-CYANOCOBALAMIN 2.5-25-2 MG PO TABS
1.0000 | ORAL_TABLET | Freq: Every day | ORAL | Status: DC
  Administered 2013-07-07 – 2013-07-08 (×2): 1 via ORAL
  Filled 2013-07-06 (×2): qty 1

## 2013-07-06 MED ORDER — LIDOCAINE HCL (PF) 1 % IJ SOLN
INTRAMUSCULAR | Status: AC
  Filled 2013-07-06: qty 30

## 2013-07-06 MED ORDER — EZETIMIBE 10 MG PO TABS
10.0000 mg | ORAL_TABLET | Freq: Every day | ORAL | Status: DC
  Administered 2013-07-07: 10 mg via ORAL
  Filled 2013-07-06 (×3): qty 1

## 2013-07-06 MED ORDER — DIGOXIN 0.0625 MG HALF TABLET
0.0625 mg | ORAL_TABLET | Freq: Every day | ORAL | Status: DC
  Administered 2013-07-07 – 2013-07-08 (×2): 0.0625 mg via ORAL
  Filled 2013-07-06 (×2): qty 1

## 2013-07-06 MED ORDER — MIDAZOLAM HCL 2 MG/2ML IJ SOLN
INTRAMUSCULAR | Status: AC
  Filled 2013-07-06: qty 2

## 2013-07-06 MED ORDER — SODIUM CHLORIDE 0.9 % IV SOLN: INTRAVENOUS | Status: AC

## 2013-07-06 MED ORDER — ASPIRIN 81 MG PO CHEW
324.0000 mg | CHEWABLE_TABLET | Freq: Once | ORAL | Status: AC
  Administered 2013-07-06: 324 mg via ORAL

## 2013-07-06 MED ORDER — SODIUM CHLORIDE 0.9 % IJ SOLN
3.0000 mL | Freq: Two times a day (BID) | INTRAMUSCULAR | Status: DC
  Administered 2013-07-07 – 2013-07-08 (×3): 3 mL via INTRAVENOUS

## 2013-07-06 MED ORDER — PANTOPRAZOLE SODIUM 40 MG PO TBEC
40.0000 mg | DELAYED_RELEASE_TABLET | Freq: Two times a day (BID) | ORAL | Status: DC | PRN
  Administered 2013-07-08: 40 mg via ORAL
  Filled 2013-07-06: qty 1

## 2013-07-06 MED ORDER — LOSARTAN POTASSIUM 25 MG PO TABS
12.5000 mg | ORAL_TABLET | Freq: Every day | ORAL | Status: DC
  Administered 2013-07-07: 14:00:00 via ORAL
  Administered 2013-07-08: 12.5 mg via ORAL
  Filled 2013-07-06 (×2): qty 0.5

## 2013-07-06 MED ORDER — NITROGLYCERIN 0.4 MG SL SUBL: 0.4000 mg | SUBLINGUAL_TABLET | SUBLINGUAL | Status: DC | PRN

## 2013-07-06 MED ORDER — HEPARIN SODIUM (PORCINE) 1000 UNIT/ML IJ SOLN
INTRAMUSCULAR | Status: AC
  Filled 2013-07-06: qty 1

## 2013-07-06 MED ORDER — FENTANYL CITRATE 0.05 MG/ML IJ SOLN
INTRAMUSCULAR | Status: AC
  Filled 2013-07-06: qty 2

## 2013-07-06 MED ORDER — ALUM & MAG HYDROXIDE-SIMETH 200-200-20 MG/5ML PO SUSP
30.0000 mL | ORAL | Status: DC | PRN
Start: 2013-07-06 — End: 2013-07-06

## 2013-07-06 MED ORDER — AMIODARONE HCL 200 MG PO TABS
200.0000 mg | ORAL_TABLET | Freq: Every day | ORAL | Status: DC
  Administered 2013-07-07 – 2013-07-08 (×2): 200 mg via ORAL
  Filled 2013-07-06 (×2): qty 1

## 2013-07-06 MED ORDER — FUROSEMIDE 40 MG PO TABS
40.0000 mg | ORAL_TABLET | Freq: Every day | ORAL | Status: DC
  Administered 2013-07-07 – 2013-07-08 (×2): 40 mg via ORAL
  Filled 2013-07-06 (×2): qty 1

## 2013-07-06 MED ORDER — SPIRONOLACTONE 25 MG PO TABS
25.0000 mg | ORAL_TABLET | Freq: Every day | ORAL | Status: DC
  Administered 2013-07-07 – 2013-07-08 (×2): 25 mg via ORAL
  Filled 2013-07-06 (×2): qty 1

## 2013-07-06 MED ORDER — NITROGLYCERIN 0.2 MG/ML ON CALL CATH LAB
INTRAVENOUS | Status: AC
  Filled 2013-07-06: qty 1

## 2013-07-06 MED ORDER — ASPIRIN 81 MG PO CHEW
CHEWABLE_TABLET | ORAL | Status: AC
  Filled 2013-07-06: qty 4

## 2013-07-06 MED ORDER — HYOSCYAMINE SULFATE ER 0.375 MG PO TB12
0.3750 mg | ORAL_TABLET | Freq: Two times a day (BID) | ORAL | Status: DC
  Administered 2013-07-07 – 2013-07-08 (×3): 0.375 mg via ORAL
  Filled 2013-07-06 (×5): qty 1

## 2013-07-06 MED ORDER — LEVOTHYROXINE SODIUM 200 MCG PO TABS
200.0000 ug | ORAL_TABLET | Freq: Every day | ORAL | Status: DC
  Administered 2013-07-07 – 2013-07-08 (×2): 200 ug via ORAL
  Filled 2013-07-06 (×4): qty 1

## 2013-07-06 MED ORDER — HEPARIN (PORCINE) IN NACL 2-0.9 UNIT/ML-% IJ SOLN
INTRAMUSCULAR | Status: AC
  Filled 2013-07-06: qty 1000

## 2013-07-06 MED ORDER — NITROGLYCERIN 0.4 MG SL SUBL
0.4000 mg | SUBLINGUAL_TABLET | Freq: Once | SUBLINGUAL | Status: AC
  Administered 2013-07-06: 0.4 mg via SUBLINGUAL
  Filled 2013-07-06: qty 1

## 2013-07-06 NOTE — CV Procedure (Signed)
Cardiac Catheterization Operative Report  [name] [id number] 4/18/20154:45 PM [name], MD  Procedure Performed:  1. Left Heart Catheterization 2. Selective Coronary Angiography  Operator: Lauree Chandler, MD  Arterial access site:  Right radial artery.   Indication:63 yo female with history of CAD, ICM, ICD in place on Xarelto, chronic systolic CHF, HTN, GERD, CKD, HLD, paroxysmal atrial fibrillation on Eliquis to ED with c/o chest pain, headache and recent visual disturbance. EKG with ST elevation inferior and anterolateral leads. Pt has ongoing chest pain. Records reviewed and prior EKG are abnormal but with new changes and ongoing pain, cardiac cath was indicated. Head CT negative for acute intracranial hemorrhage. Pt denies SOB but does note chest pain that has felt like her heartburn for past 24 hours. Pt taken to cath lab for emergent cardiac cath.                                        Procedure Details: The risks, benefits, complications, treatment options, and expected outcomes were discussed with the patient. Emergency consent placed on the chart.  The patient was brought to the cath lab from the ED. The patient was further sedated with Versed and Fentanyl. The right wrist was assessed with an Allens test which was positive. The right wrist was prepped and draped in a sterile fashion. 1% lidocaine was used for local anesthesia. Using the modified Seldinger access technique, a 5 French sheath was placed in the right radial artery. 3 mg Verapamil was given through the sheath. 3500 units IV heparin was given. Standard diagnostic catheters were used to perform selective coronary angiography. A pigtail catheter was used to cross the aortic valve and measure LV pressures. The sheath was removed from the right radial artery and a Terumo hemostasis band was applied at the arteriotomy site on the right wrist.    There were no immediate complications. The patient was  taken to the recovery area in stable condition.   Hemodynamic Findings: Central aortic pressure: 151/65 Left ventricular pressure: 109/2/0  Angiographic Findings:  Left main: Distal 20% stenosis.   Left Anterior Descending Artery: Large caliber vessel that courses to the apex. The proximal vessel has calcification with 20% stenosis. The mid vessel has a patent stented segment with minimal restenosis. The distal vessel has a 30% stenosis. The first diagonal branch is small in caliber with ostial 80% stenosis, unchanged from last cath. The second diagonal branch is small in caliber with ostial 30% stenosis.   Circumflex Artery: Large caliber vessel with large caliber first obtuse marginal branch and moderate caliber second obtuse marginal branch. There is a patent stent in the first OM branch with minimal restenosis. The mid AV groove Circumflex has 20% stenosis.   Right Coronary Artery: Large caliber dominant vessel with diffuse 30% stenosis in the proximal and mid vessel. There is diffuse calcification in the proximal and mid vessel. There is subtle flow disturbance in the RCA suggestive of microvascular disease.   Left Ventricular Angiogram: Deferred.   Impression: 1. Double vessel CAD with patent stents mid LAD and OM1, mild disease RCA 2. 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 Known ischemic cardiomyopathy  Recommendations: Will admit to telemetry unit. No evidence of ACS. After reviewing the films today and old cath films, she has slightly decreased flow in her coronaries without obstructive large vessel disease. This is suggestive of micro-vascular disease. She  may benefit from augmentation of therapy to treat microvascular disease. Resume home meds. If any further neurological changes, will need Neuro consult.        Complications:  None. The patient tolerated the procedure well.

## 2013-07-06 NOTE — Progress Notes (Signed)
Chaplain followed up on referral from daytime chaplain for pt who has been in cath lab. Pt now in 3W32, with pt's brother sitting at bedside. Pt smiling and states she is no longer in pain. Pt understands she is doing better now but is awaiting doctor's report of the cath procedure. Pt expressed appreciation for chaplain checking on her.

## 2013-07-06 NOTE — Telephone Encounter (Signed)
    Pt has a h/o PAF, on Amiodarone, digoxin and Eliquis. She has a h/o CAD, s/p MI with coronary stents x 4. Pt called stating that she developed left eye pain with blurred vision that developed today. She has a h/o TIA x 3 and her symptoms mimic her past symptoms. She took her vitals and BP was 112/70 and HR was 29! She rechecked it and it was 49. She denies syncope/ near syncope. She also notes mild chest tightness, nausea/vomiting and belching. She denies SOB. She just called 911 and they are currently on their way. She is home alone. She has unlocked her doors so that they can gain access, in the event that she cannot reach the door when they arrive. She is talking in complete sentences and in a calm manner. Speech is normal.  I remained on the phone until EMS arrived. She will be transported to [facility].   Lyda Jester, PA-C

## 2013-07-06 NOTE — ED Notes (Signed)
Pt c/o center chest pain that feels like indigestion onset yesterday. Pt of [facility], reports that she called their office and was told they would meet her here. Pt reports shortness of breath and nausea. Pt reports that her heart rate was 29 this morning.

## 2013-07-06 NOTE — ED Notes (Signed)
Notified [facility] to Activate STEMI

## 2013-07-06 NOTE — ED Notes (Signed)
Pt placed in gown. zoll pads applied.

## 2013-07-06 NOTE — H&P (Signed)
Patient ID: [name] MRN: [id number] DOB/AGE: [birth year] 63 y.o. Admit date: 07/06/2013  Primary Cardiologist: Caryl Comes  HPI: 63 yo female with history of CAD, ICM, ICD in place on Xarelto, chronic systolic CHF, HTN, GERD, CKD, HLD, paroxysmal atrial fibrillation on Eliquis to ED with c/o chest pain, headache and recent visual disturbance. EKG with ST elevation inferior and anterolateral leads. Pt has ongoing chest pain. Head CT negative for acute intracranial hemorrhage. Pt denies SOB but does note chest pain that has felt like her heartburn for past 24 hours. Pt taken to cath lab for emergent cardiac cath. Still having headache, left sided.   Review of systems complete and found to be negative unless listed above   Past Medical History  Diagnosis Date  . Spinal stenosis   . Numbness of foot     left foot  . Diverticulitis     a. s/p partial colectomy 1/12 with reversal in July 2012.  . Colitis, ischemic   . DJD (degenerative joint disease)     History of multiple surgeries to the back, shoulder and knee  . Ischemic cardiomyopathy     a. Echo 06/16/10: EF 25-30%, anteroseptal and apical hypokinesis, moderate AI, mild MR.  . Chronic systolic heart failure   . CAD (coronary artery disease)     a.  Ant MI 8/03 with stenting of the LAD;  b. staged PCI with Cypher DES to OM1 8/03;  c. s/p Cypher DES to LAD 7/04;  d.  multiple cardiac caths in past (chronically abnl ECG);    e. cardiac catheterization 3/12: LAD stent patent, distal LAD 40-50%, small ostial D1 80%, ostial D2 60%, OM-1 stent patent (20-30%), proximal RCA 50%, proximal to mid RCA 40-50%; f. cath 02/17/2011 - nonobs  . LV (left ventricular) mural thrombus   . CKD (chronic kidney disease), stage III     creatinine:  1.3 in 4/12;    . Tobacco abuse     history  . GERD (gastroesophageal reflux disease)   . Hypertension   . Lower GI bleed     August 2011  . Hypothyroidism   . Aortic insufficiency   . Pseudoaneurysm       History of, right forearm  . Hyperlipidemia   . Compression fracture 07/04/11    L2  . 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 PAF (paroxysmal atrial fibrillation)   . Anatomical narrow angle of right eye   . Blood transfusion     "must have been when I had the total knee replacement" (04/10/2013)  . Anemia   . H/O hiatal hernia   . Sinus bradycardia 03/05/2012  . Paroxysmal atrial flutter   . Mitral regurgitation   . Heart murmur   . CHF (congestive heart failure)     "3-4 times" (04/10/2013)  . Myocardial infarction 2002  . Stroke     History of TIA and possible CVA; hospitalized 2004; on coumadin  . Stroke     "I've had 3"; denies residual on 04/10/2013  . Chronic lower back pain     Family History  Problem Relation Age of Onset  . Diabetes Mother   . Diabetes Brother   . Arthritis      family history  . Prostate cancer      Family History    History   Social History  . Marital Status: Widowed  Spouse Name: N/A    Number of Children: N/A  . Years of Education: N/A   Occupational History  . Retired    Social History Main Topics  . Smoking status: Former Smoker -- 0.10 packs/day for 33 years    Types: Cigarettes    Quit date: 04/21/2001  . Smokeless tobacco: Never Used     Comment: "smoked ~ 1 pack/month"  . Alcohol Use: 0.6 oz/week    1 Glasses of wine per week  . Drug Use: No  . Sexual Activity: Not Currently   Other Topics Concern  . Not on file   Social History Narrative   Lives alone, has a house, has some household help. Handicapped upfitted.    Past Surgical History  Procedure Laterality Date  . Back surgery    . Cardiac defibrillator placement  07/2003; 10/2004; 2014  . Lumbar laminectomy/decompression microdiscectomy  10/2001    L5-S1/E-chart  . Knee arthroscopy Right   . Thyroidectomy  1970's  . Total knee arthroplasty Left  11/2003  . X-stop implantation  12/2004    L3-4; L4-5  . Fixation kyphoplasty thoracic spine  07/2007    T3, 4, 6 compression fractures  . Shoulder open rotator cuff repair Right 04/2008  . Lumbar laminectomy/decompression microdiscectomy  01/2010  . Colectomy  03/2010    sigmoid left; transverse  . Peripherally inserted central catheter insertion  03/2010    removed upon discharge  . Colostomy reversal  12/2010  . Tonsillectomy and adenoidectomy  1969  . Appendectomy  03/2010  . Glaucoma surgery Right   . Biv icd genertaor change out  12/17/2012    "right now I don't have a defibrillator; I've been using an external one" (06/04/2013)  . Icd lead removal Left 12/17/2012    Procedure: ICD LEAD REMOVAL;  Surgeon: Evans Lance, MD;  Location: [facility];  Service: Cardiovascular;  Laterality: Left;  . Coronary angioplasty with stent placement  10/2001; 09/2002; ?date    "I've got a total of 4" (04/10/2013)    Allergies  Allergen Reactions  . Coconut Oil Anaphylaxis and Hives  . Lansoprazole Nausea Only  . Penicillins Swelling    Throat swells  . Statins Other (See Comments)    cramps  . Lisinopril     Cough- but still tolerates it  . Lactose Intolerance (Gi) Diarrhea and Other (See Comments)    Patient reports abdominal cramping and diarrhea with lactose products.     Prior to Admission Meds:  No current facility-administered medications on file prior to encounter.   Current Outpatient Prescriptions on File Prior to Encounter  Medication Sig Dispense Refill  . apixaban (ELIQUIS) 5 MG TABS tablet Take 5 mg by mouth 2 (two) times daily.      [name] amiodarone (PACERONE) 200 MG tablet Take 200 mg by mouth daily.      . carvedilol (COREG) 6.25 MG tablet Take 0.5 tablets (3.125 mg total) by mouth 2 (two) times daily with a meal.  60 tablet  6  . digoxin (LANOXIN) 0.125 MG tablet Take 0.5 tablets (0.0625 mg total) by mouth daily.  45 tablet  3  . ezetimibe (ZETIA) 10 MG tablet Take 10 mg by mouth at  bedtime.       . fexofenadine (ALLEGRA) 180 MG tablet Take 180 mg by mouth daily as needed for allergies.       . fluticasone (FLONASE) 50 MCG/ACT nasal spray Place 2 sprays into both nostrils daily as needed for allergies.       [name]  folic acid-pyridoxine-cyancobalamin (FOLTX) 2.5-25-2 MG TABS Take 1 tablet by mouth daily.       . furosemide (LASIX) 40 MG tablet Take 1 tablet (40 mg total) by mouth daily.  30 tablet  6  . hyoscyamine (LEVBID) 0.375 MG 12 hr tablet Take 1 tablet (0.375 mg total) by mouth 2 (two) times daily.  60 tablet  5  . levothyroxine (SYNTHROID, LEVOTHROID) 200 MCG tablet Take 200 mcg by mouth daily before breakfast.       . losartan (COZAAR) 25 MG tablet Take 0.5 tablets (12.5 mg total) by mouth daily.  30 tablet  6  . nitroGLYCERIN (NITROSTAT) 0.4 MG SL tablet Place 0.4 mg under the tongue every 5 (five) minutes as needed for chest pain.       [name] omega-3 acid ethyl esters (LOVAZA) 1 G capsule Take 1 g by mouth daily.      . pantoprazole (PROTONIX) 40 MG tablet Take 40 mg by mouth 2 (two) times daily as needed (gerd).       . polyethylene glycol (MIRALAX / GLYCOLAX) packet Take 17 g by mouth daily as needed (for constipation).       . raloxifene (EVISTA) 60 MG tablet Take 60 mg by mouth daily.      [name] spironolactone (ALDACTONE) 25 MG tablet Take 25 mg by mouth daily.        Physical Exam: Blood pressure 121/56, pulse 70, temperature 98.5 F (36.9 C), temperature source Oral, resp. rate 14, height 5\' 4"  (1.626 m), weight 140 lb (63.504 kg), SpO2 100.00%.    General: Well developed, well nourished, NAD  HEENT: OP clear, mucus membranes moist  SKIN: warm, dry. No rashes.  Neuro: No focal deficits  Musculoskeletal: Muscle strength 5/5 all ext  Psychiatric: Mood and affect normal  Neck: No JVD, no carotid bruits, no thyromegaly, no lymphadenopathy.  Lungs:Clear bilaterally, no wheezes, rhonci, crackles  Cardiovascular: Regular rate and rhythm. No murmurs, gallops or rubs.    Abdomen:Soft. Bowel sounds present. Non-tender.  Extremities: No lower extremity edema. Pulses are 2 + in the bilateral DP/PT.   Labs:   Lab Results  Component Value Date   WBC 3.2* 07/06/2013   HGB 16.3* 07/06/2013   HCT 48.0* 07/06/2013   MCV 100.9* 07/06/2013   PLT 141* 07/06/2013    Recent Labs Lab 07/06/13 1549  NA 138  K 4.3  CL 104  BUN 16  CREATININE 1.40*  GLUCOSE 132*   Lab Results  Component Value Date             TROPONINI <0.30 06/05/2013     Radiology: CT HEAD WITHOUT CONTRAST  Mild cerebral atrophy. Patchy and confluent areas of decreased  attenuation are noted throughout the deep and periventricular white  matter of the cerebral hemispheres bilaterally, compatible with  chronic microvascular ischemic disease. No acute intracranial  abnormalities. Specifically, no evidence of acute intracranial  hemorrhage, no definite findings of acute/subacute cerebral  ischemia, no mass, mass effect, hydrocephalus or abnormal intra or  extra-axial fluid collections. Visualized paranasal sinuses and  mastoids are well pneumatized. No acute displaced skull fractures  are identified.  IMPRESSION:  1. No acute intracranial abnormalities.  2. Mild cerebral atrophy with chronic microvascular ischemic changes  in the cerebral white matter, similar to the prior study.   EKG: Sinus, ST elevation inferior and anterolateral leads.   ASSESSMENT AND PLAN:   1. Chest pain/ST elevation inferior and anterolateral leads: Emergent cath for possible acute MI with stable  CAD. Her chest pain may be from microvascular disease. I have reviewed the film from today and old cath films and each time she has this presentation, cath shows slightly decreased flow in the coronaries suggestive of microvascular disease.   2. Ischemic cardiomyopathy: ICD in place. Resume home meds.   3. HTN: BP controlled.   4. PAF: Sinus today. Resume Eliquis in am.   6. Chronic systolic CHF: Volume status is  ok. LVEDP is low.   7. Blurred vision/Headache: Pt has history of CVA. Head CT without acute changes. This still could represent a neurological event. Will monitor x 24 more hours. If her neurological status changes, will need Neuro consult tonight.   Darlina Guys, MD 07/06/2013, 4:12 PM

## 2013-07-06 NOTE — ED Notes (Addendum)
Pt transported to CT with RN, emt and MD

## 2013-07-06 NOTE — Progress Notes (Signed)
MEDICATION RELATED CONSULT NOTE - INITIAL   Pharmacy Consult for Apxiaban Indication: Afib  Allergies  Allergen Reactions  . Coconut Oil Anaphylaxis and Hives  . Lansoprazole Nausea Only  . Penicillins Swelling    Throat swells  . Statins Other (See Comments)    cramps  . Lisinopril     Cough- but still tolerates it  . Lactose Intolerance (Gi) Diarrhea and Other (See Comments)    Patient reports abdominal cramping and diarrhea with lactose products.    Patient Measurements: Height: 5\' 4"  (162.6 cm) Weight: 140 lb (63.504 kg) IBW/kg (Calculated) : 54.7  Vital Signs: Temp: 98.5 F (36.9 C) (04/18 1507) Temp src: Oral (04/18 1507) BP: 163/95 mmHg (04/18 1505) Pulse Rate: 52 (04/18 1503)  Labs:  Recent Labs  07/06/13 1549  HGB 16.3*  HCT 48.0*  CREATININE 1.40*   Estimated Creatinine Clearance: 35.5 ml/min (by C-G formula based on Cr of 1.4).  Medical History: Past Medical History  Diagnosis Date  . Spinal stenosis   . Numbness of foot     left foot  . Diverticulitis     a. s/p partial colectomy 1/12 with reversal in July 2012.  . Colitis, ischemic   . DJD (degenerative joint disease)     History of multiple surgeries to the back, shoulder and knee  . Ischemic cardiomyopathy     a. Echo 06/16/10: EF 25-30%, anteroseptal and apical hypokinesis, moderate AI, mild MR.  . Chronic systolic heart failure   . CAD (coronary artery disease)     a.  Ant Becky 8/03 with stenting of the LAD;  b. staged PCI with Cypher DES to OM1 8/03;  c. s/p Cypher DES to LAD 7/04;  d.  multiple cardiac caths in past (chronically abnl ECG);    e. cardiac catheterization 3/12: LAD stent patent, distal LAD 40-50%, small ostial D1 80%, ostial D2 60%, OM-1 stent patent (20-30%), proximal RCA 50%, proximal to mid RCA 40-50%; f. cath 02/17/2011 - nonobs  . LV (left ventricular) mural thrombus   . CKD (chronic kidney disease), stage III     creatinine:  1.3 in 4/12;    . Tobacco abuse     history   . GERD (gastroesophageal reflux disease)   . Hypertension   . Lower GI bleed     August 2011  . Hypothyroidism   . Aortic insufficiency   . Pseudoaneurysm     History of, right forearm  . Hyperlipidemia   . Compression fracture 07/04/11    L2  . 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 PAF (paroxysmal atrial fibrillation)   . Anatomical narrow angle of right eye   . Blood transfusion     "must have been when I had the total knee replacement" (04/10/2013)  . Anemia   . H/O hiatal hernia   . Sinus bradycardia 03/05/2012  . Paroxysmal atrial flutter   . Becky regurgitation   . Heart murmur   . [name] (congestive heart failure)     "3-4 times" (04/10/2013)  . Myocardial infarction 2002  . Stroke     History of TIA and possible CVA; hospitalized 2004; on coumadin  . Stroke     "I've had 3"; denies residual on 04/10/2013  . Chronic lower back pain    Assessment: 63yo female admitted with c/o CP and code STEMI called.  Cardiology consult ongoing.  She has  extensive PMH including cardiac diseases of bradycardia, [name], [name], [name], Becky as well as a stroke.  She is on chronic Apixaban for anticoagulation and had her dose yesterday.    Goal of Therapy:  Clinical response to therapy  Plan:  - Plans for cardiac cath  - Will f/u Apixaban for restart  Rober Minion, PharmD., MS Clinical Pharmacist Pager:  [phone] Thank you for allowing pharmacy to be part of this patients care team. 07/06/2013,4:01 PM

## 2013-07-06 NOTE — ED Provider Notes (Signed)
CSN: [id number]     Arrival date & time 07/06/13  1458 History   First MD Initiated Contact with Patient 07/06/13 1511     Chief Complaint  Patient presents with  . Chest Pain     (Consider location/radiation/quality/duration/timing/severity/associated sxs/prior Treatment) Patient is a 63 y.o. female presenting with chest pain. The history is provided by the patient.  Chest Pain Associated symptoms: headache, nausea and shortness of breath   Associated symptoms: no abdominal pain, no back pain, no numbness, not vomiting and no weakness    patient presented with chest pain. She's been having pain since yesterday. She states it feels like her previous GERD. She's had some nausea with it. It was upper chest into the back. Also goes up into her throat. She states it also feels like her previous heart attack. She called her cardiologist because she has had some vision changes a headache yesterday. She states she decreased vision in her left eye. She states this is like her previous TIAs or strokes. She's had some mild difficulty breathing. She's had previous coronary artery disease and severe CHF.  Past Medical History  Diagnosis Date  . Spinal stenosis   . Numbness of foot     left foot  . Diverticulitis     a. s/p partial colectomy 1/12 with reversal in July 2012.  . Colitis, ischemic   . DJD (degenerative joint disease)     History of multiple surgeries to the back, shoulder and knee  . Ischemic cardiomyopathy     a. Echo 06/16/10: EF 25-30%, anteroseptal and apical hypokinesis, moderate AI, mild MR.  . Chronic systolic heart failure   . CAD (coronary artery disease)     a.  Ant MI 8/03 with stenting of the LAD;  b. staged PCI with Cypher DES to OM1 8/03;  c. s/p Cypher DES to LAD 7/04;  d.  multiple cardiac caths in past (chronically abnl ECG);    e. cardiac catheterization 3/12: LAD stent patent, distal LAD 40-50%, small ostial D1 80%, ostial D2 60%, OM-1 stent patent (20-30%), proximal  RCA 50%, proximal to mid RCA 40-50%; f. cath 02/17/2011 - nonobs  . LV (left ventricular) mural thrombus   . CKD (chronic kidney disease), stage III     creatinine:  1.3 in 4/12;    . Tobacco abuse     history  . GERD (gastroesophageal reflux disease)   . Hypertension   . Lower GI bleed     August 2011  . Hypothyroidism   . Aortic insufficiency   . Pseudoaneurysm     History of, right forearm  . Hyperlipidemia   . Compression fracture 07/04/11    L2  . 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 PAF (paroxysmal atrial fibrillation)   . Anatomical narrow angle of right eye   . Blood transfusion     "must have been when I had the total knee replacement" (04/10/2013)  . Anemia   . H/O hiatal hernia   . Sinus bradycardia 03/05/2012  . Paroxysmal atrial flutter   . Mitral regurgitation   . Heart murmur   . CHF (congestive heart failure)     "3-4 times" (04/10/2013)  . Myocardial infarction 2002  . Stroke     History of TIA and possible CVA; hospitalized 2004; on coumadin  . Stroke     "I've had 3"; denies residual on  04/10/2013  . Chronic lower back pain    Past Surgical History  Procedure Laterality Date  . Back surgery    . Cardiac defibrillator placement  07/2003; 10/2004; 2014  . Lumbar laminectomy/decompression microdiscectomy  10/2001    L5-S1/E-chart  . Knee arthroscopy Right   . Thyroidectomy  1970's  . Total knee arthroplasty Left 11/2003  . X-stop implantation  12/2004    L3-4; L4-5  . Fixation kyphoplasty thoracic spine  07/2007    T3, 4, 6 compression fractures  . Shoulder open rotator cuff repair Right 04/2008  . Lumbar laminectomy/decompression microdiscectomy  01/2010  . Colectomy  03/2010    sigmoid left; transverse  . Peripherally inserted central catheter insertion  03/2010    removed upon discharge  . Colostomy reversal  12/2010  . Tonsillectomy and  adenoidectomy  1969  . Appendectomy  03/2010  . Glaucoma surgery Right   . Biv icd genertaor change out  12/17/2012    "right now I don't have a defibrillator; I've been using an external one" (06/04/2013)  . Icd lead removal Left 12/17/2012    Procedure: ICD LEAD REMOVAL;  Surgeon: Evans Lance, MD;  Location: [facility];  Service: Cardiovascular;  Laterality: Left;  . Coronary angioplasty with stent placement  10/2001; 09/2002; ?date    "I've got a total of 4" (04/10/2013)   Family History  Problem Relation Age of Onset  . Diabetes Mother   . Diabetes Brother   . Arthritis      family history  . Prostate cancer      Family History   History  Substance Use Topics  . Smoking status: Former Smoker -- 0.10 packs/day for 33 years    Types: Cigarettes    Quit date: 04/21/2001  . Smokeless tobacco: Never Used     Comment: "smoked ~ 1 pack/month"  . Alcohol Use: 0.6 oz/week    1 Glasses of wine per week   OB History   Grav Para Term Preterm Abortions TAB SAB Ect Mult Living                 Review of Systems  Constitutional: Negative for activity change and appetite change.  Eyes: Negative for pain.  Respiratory: Positive for shortness of breath. Negative for chest tightness.   Cardiovascular: Positive for chest pain. Negative for leg swelling.  Gastrointestinal: Positive for nausea. Negative for vomiting, abdominal pain and diarrhea.  Genitourinary: Negative for flank pain.  Musculoskeletal: Negative for back pain and neck stiffness.  Skin: Negative for rash.  Neurological: Positive for headaches. Negative for weakness and numbness.  Psychiatric/Behavioral: Negative for behavioral problems.      Allergies  Coconut oil; Lansoprazole; Penicillins; Statins; Lisinopril; and Lactose intolerance (gi)  Home Medications   Prior to Admission medications   Medication Sig Start Date End Date Taking? Authorizing Provider  amiodarone (PACERONE) 200 MG tablet Take 200 mg by mouth daily.  05/24/13  Yes Deboraha Sprang, MD  apixaban (ELIQUIS) 5 MG TABS tablet Take 5 mg by mouth 2 (two) times daily.   Yes Historical Provider, MD  carvedilol (COREG) 6.25 MG tablet Take 0.5 tablets (3.125 mg total) by mouth 2 (two) times daily with a meal. 06/11/13  Yes Amy D Clegg, NP  digoxin (LANOXIN) 0.125 MG tablet Take 0.5 tablets (0.0625 mg total) by mouth daily. 06/18/13  Yes Rande Brunt, NP  ezetimibe (ZETIA) 10 MG tablet Take 10 mg by mouth at bedtime.    Yes Minus Breeding,  MD  fexofenadine (ALLEGRA) 180 MG tablet Take 180 mg by mouth daily as needed for allergies.    Yes Historical Provider, MD  fluticasone (FLONASE) 50 MCG/ACT nasal spray Place 2 sprays into both nostrils daily as needed for allergies.    Yes Historical Provider, MD  folic acid-pyridoxine-cyancobalamin (FOLTX) 2.5-25-2 MG TABS Take 1 tablet by mouth daily.    Yes Historical Provider, MD  furosemide (LASIX) 40 MG tablet Take 1 tablet (40 mg total) by mouth daily. 06/11/13  Yes Amy D Clegg, NP  hyoscyamine (LEVBID) 0.375 MG 12 hr tablet Take 0.375 mg by mouth every 12 (twelve) hours as needed for cramping.   Yes Historical Provider, MD  levothyroxine (SYNTHROID, LEVOTHROID) 200 MCG tablet Take 200 mcg by mouth daily before breakfast.    Yes Historical Provider, MD  losartan (COZAAR) 25 MG tablet Take 0.5 tablets (12.5 mg total) by mouth daily. 06/11/13  Yes Amy D Clegg, NP  nitroGLYCERIN (NITROSTAT) 0.4 MG SL tablet Place 0.4 mg under the tongue every 5 (five) minutes as needed for chest pain.    Yes Historical Provider, MD  omega-3 acid ethyl esters (LOVAZA) 1 G capsule Take 1 g by mouth daily.   Yes Historical Provider, MD  pantoprazole (PROTONIX) 40 MG tablet Take 40 mg by mouth 2 (two) times daily as needed (gerd).    Yes Historical Provider, MD  polyethylene glycol (MIRALAX / GLYCOLAX) packet Take 17 g by mouth daily as needed (for constipation).    Yes Historical Provider, MD  raloxifene (EVISTA) 60 MG tablet Take 60 mg by  mouth daily.   Yes Historical Provider, MD  spironolactone (ALDACTONE) 25 MG tablet Take 25 mg by mouth daily.   Yes Historical Provider, MD   BP 115/73  Pulse 61  Temp(Src) 98.5 F (36.9 C) (Oral)  Resp 16  Ht 5\' 4"  (1.626 m)  Wt 140 lb (63.504 kg)  BMI 24.02 kg/m2  SpO2 100% Physical Exam  Nursing note and vitals reviewed. Constitutional: She is oriented to person, place, and time. She appears well-developed and well-nourished.  HENT:  Head: Normocephalic and atraumatic.  Eyes: EOM are normal. Pupils are equal, round, and reactive to light.  Neck: Normal range of motion. Neck supple.  Cardiovascular: Normal rate, regular rhythm and normal heart sounds.   No murmur heard. Pulmonary/Chest: Effort normal and breath sounds normal. No respiratory distress. She has no wheezes. She has no rales.  Abdominal: Soft. Bowel sounds are normal. She exhibits no distension. There is no tenderness. There is no rebound and no guarding.  Musculoskeletal: Normal range of motion.  Neurological: She is alert and oriented to person, place, and time. No cranial nerve deficit.  Visual fields grossly intact bilaterally. Grossly equal vision bilaterally.  Skin: Skin is warm and dry.  Psychiatric: She has a normal mood and affect. Her speech is normal.    ED Course  Procedures (including critical care time) Labs Review Labs Reviewed  CBC WITH DIFFERENTIAL - Abnormal; Notable for the following:    WBC 3.2 (*)    Hemoglobin 15.3 (*)    MCV 100.9 (*)    MCH 35.7 (*)    Platelets 141 (*)    Neutrophils Relative % 36 (*)    Neutro Abs 1.2 (*)    Monocytes Relative 24 (*)    All other components within normal limits  DIGOXIN LEVEL - Abnormal; Notable for the following:    Digoxin Level 0.7 (*)    All other components within  normal limits  I-STAT CHEM 8, ED - Abnormal; Notable for the following:    Creatinine, Ser 1.40 (*)    Glucose, Bld 132 (*)    Calcium, Ion 1.05 (*)    Hemoglobin 16.3 (*)     HCT 48.0 (*)    All other components within normal limits  PROTIME-INR  APTT  BASIC METABOLIC PANEL  CBC  I-STAT TROPOININ, ED    Imaging Review Ct Head Wo Contrast  07/06/2013   CLINICAL DATA:  Headache and left-sided visual changes.  EXAM: CT HEAD WITHOUT CONTRAST  TECHNIQUE: Contiguous axial images were obtained from the base of the skull through the vertex without intravenous contrast.  COMPARISON:  Head CT 04/07/2006.  FINDINGS: Mild cerebral atrophy. Patchy and confluent areas of decreased attenuation are noted throughout the deep and periventricular white matter of the cerebral hemispheres bilaterally, compatible with chronic microvascular ischemic disease. No acute intracranial abnormalities. Specifically, no evidence of acute intracranial hemorrhage, no definite findings of acute/subacute cerebral ischemia, no mass, mass effect, hydrocephalus or abnormal intra or extra-axial fluid collections. Visualized paranasal sinuses and mastoids are well pneumatized. No acute displaced skull fractures are identified.  IMPRESSION: 1. No acute intracranial abnormalities. 2. Mild cerebral atrophy with chronic microvascular ischemic changes in the cerebral white matter, similar to the prior study.   Electronically Signed   By: Vinnie Langton M.D.   On: 07/06/2013 16:07   Dg Chest Port 1 View  07/06/2013   CLINICAL DATA:  Code STEMI.  EXAM: PORTABLE CHEST - 1 VIEW  COMPARISON:  Chest x-ray 06/04/2013.  FINDINGS: Lung volumes are normal. No consolidative airspace disease. No definite pleural effusions (although the costophrenic sulci are incompletely visualized bilaterally). No evidence of pulmonary edema. Heart size is normal. Upper mediastinal contours are within normal limits. Atherosclerosis in the thoracic aorta. Post vertebroplasty changes throughout the upper and mid thoracic spine.  IMPRESSION: 1. No radiographic evidence of acute cardiopulmonary disease. 2. Atherosclerosis.   Electronically Signed    By: Vinnie Langton M.D.   On: 07/06/2013 16:26     EKG Interpretation   Date/Time:  Saturday July 06 2013 16:04:37 EDT Ventricular Rate:  77 PR Interval:  161 QRS Duration: 80 QT Interval:  451 QTC Calculation: 510 R Axis:   -26 Text Interpretation:  Sinus rhythm Inferolateral infarct, acute (RCA)  Anterior infarct, acute (LAD) Prolonged QT interval Probable RV  involvement, suggest recording right precordial leads STEMI Confirmed by  Keyvin Rison  MD, Delphine Sizemore [phone]) on 07/06/2013 4:12:16 PM      Date: 07/06/2013 1513  Rate: 81  Rhythm: normal sinus rhythm  QRS Axis: normal  Intervals: normal  ST/T Wave abnormalities: ST elevations laterally and inferiorly. Q waves inferiorly and laterally also. Some baseline wander  Conduction Disutrbances:none  Narrative Interpretation:   Old EKG Reviewed: changes noted   Date: 07/06/2013 1533  Rate: 67  Rhythm: normal sinus rhythm  QRS Axis: normal  Intervals: normal  ST/T Wave abnormalities: Inferior lateral Q waves. ST elevation inferiorly and laterally, but less pronounced than previous.  Conduction Disutrbances:none  Narrative Interpretation:   Old EKG Reviewed: changes noted   MDM   Final diagnoses:  STEMI (ST elevation myocardial infarction)    Patient with chest pain. ST elevation on EKG is, but it varies in multiple leads. There is some artifact that made difficult to interpret. Patient also had a headache and is on anticoagulation. She states it felt like her previous strokes and had left-sided visual changes. CT scan was  done emergently. Code STEMI was not initially called to to the variation of EKGs. I had been unable to reach the STEMI Dr. and code STEMI was called to 2 recurrent changes on EKG. Head CT did not show any bleeding. Dr. Blima Ledger to the patient to the Cath Lab once the cath team was available. Patient's pain had much improved, but still present on her visit to the ER.  CRITICAL CARE Performed by: Jasper Riling.  Aiyonna Lucado Total critical care time: 30 Critical care time was exclusive of separately billable procedures and treating other patients. Critical care was necessary to treat or prevent imminent or life-threatening deterioration. Critical care was time spent personally by me on the following activities: development of treatment plan with patient and/or surrogate as well as nursing, discussions with consultants, evaluation of patient's response to treatment, examination of patient, obtaining history from patient or surrogate, ordering and performing treatments and interventions, ordering and review of laboratory studies, ordering and review of radiographic studies, pulse oximetry and re-evaluation of patient's condition.     Jasper Riling. Alvino Chapel, MD 07/06/13 2114

## 2013-07-06 NOTE — ED Notes (Signed)
Chest xray complete

## 2013-07-06 NOTE — ED Notes (Signed)
Pt reports that she took 1 nitro last night with relief.

## 2013-07-06 NOTE — ED Notes (Addendum)
MD activating STEMI

## 2013-07-06 NOTE — ED Notes (Signed)
Cardiologist at bedside.  

## 2013-07-06 NOTE — ED Notes (Signed)
Pt being transported to cath lab. Unable to complete stroke swallow screen. Pt denies any trouble chewing or swallowing. Pt sitting up with good head control, speaking in full sentences. Pt had no difficulty swallowing aspirin.

## 2013-07-06 NOTE — Progress Notes (Signed)
Chaplain responded to code stemi page.  Met patient and talked briefly before MD entered room. ° °Will refer to pm Chaplain for follow-up. ° °Rev. Jan Hill, Chaplain [phone] °

## 2013-07-07 DIAGNOSIS — E785 Hyperlipidemia, unspecified: Secondary | ICD-10-CM

## 2013-07-07 DIAGNOSIS — I519 Heart disease, unspecified: Secondary | ICD-10-CM

## 2013-07-07 DIAGNOSIS — K219 Gastro-esophageal reflux disease without esophagitis: Secondary | ICD-10-CM

## 2013-07-07 DIAGNOSIS — I6350 Cerebral infarction due to unspecified occlusion or stenosis of unspecified cerebral artery: Secondary | ICD-10-CM

## 2013-07-07 DIAGNOSIS — I219 Acute myocardial infarction, unspecified: Secondary | ICD-10-CM

## 2013-07-07 DIAGNOSIS — I5022 Chronic systolic (congestive) heart failure: Secondary | ICD-10-CM

## 2013-07-07 DIAGNOSIS — I4891 Unspecified atrial fibrillation: Secondary | ICD-10-CM

## 2013-07-07 DIAGNOSIS — R51 Headache: Secondary | ICD-10-CM

## 2013-07-07 DIAGNOSIS — I498 Other specified cardiac arrhythmias: Secondary | ICD-10-CM

## 2013-07-07 DIAGNOSIS — N189 Chronic kidney disease, unspecified: Secondary | ICD-10-CM

## 2013-07-07 DIAGNOSIS — Z7901 Long term (current) use of anticoagulants: Secondary | ICD-10-CM

## 2013-07-07 LAB — BASIC METABOLIC PANEL
BUN: 17 mg/dL (ref 6–23)
CALCIUM: 9.1 mg/dL (ref 8.4–10.5)
CO2: 19 mEq/L (ref 19–32)
CREATININE: 1.17 mg/dL — AB (ref 0.50–1.10)
Chloride: 101 mEq/L (ref 96–112)
GFR calc Af Amer: 56 mL/min — ABNORMAL LOW (ref >90)
GFR calc non Af Amer: 49 mL/min — ABNORMAL LOW (ref >90)
Glucose, Bld: 94 mg/dL (ref 70–99)
Potassium: 3.7 mEq/L (ref 3.7–5.3)
Sodium: 135 mEq/L — ABNORMAL LOW (ref 137–147)

## 2013-07-07 LAB — CBC
HCT: 38.2 % (ref 36.0–46.0)
Hemoglobin: 13.2 g/dL (ref 12.0–15.0)
MCH: 34.6 pg — ABNORMAL HIGH (ref 26.0–34.0)
MCHC: 34.6 g/dL (ref 30.0–36.0)
MCV: 100 fL (ref 78.0–100.0)
Platelets: 120 10*3/uL — ABNORMAL LOW (ref 150–400)
RBC: 3.82 MIL/uL — ABNORMAL LOW (ref 3.87–5.11)
RDW: 14.8 % (ref 11.5–15.5)
WBC: 3.2 10*3/uL — ABNORMAL LOW (ref 4.0–10.5)

## 2013-07-07 MED ORDER — HYDROCODONE-ACETAMINOPHEN 5-325 MG PO TABS
1.0000 | ORAL_TABLET | Freq: Two times a day (BID) | ORAL | Status: DC | PRN
  Administered 2013-07-07: 1 via ORAL
  Filled 2013-07-07: qty 1

## 2013-07-07 NOTE — Progress Notes (Signed)
SUBJECTIVE: Pt feels well this morning. Denies chest pain, palpitations, leg swelling, and shortness of breath. Still has a mild left periorbital and frontal headache.     Intake/Output Summary (Last 24 hours) at 07/07/13 3710 Last data filed at 07/07/13 0100  Gross per 24 hour  Intake      0 ml  Output    200 ml  Net   -200 ml    Current Facility-Administered Medications  Medication Dose Route Frequency Provider Last Rate Last Dose  . 0.9 %  sodium chloride infusion  250 mL Intravenous PRN Burnell Blanks, MD      . alum & mag hydroxide-simeth (MAALOX/MYLANTA) 200-200-20 MG/5ML suspension 30 mL  30 mL Oral Q4H PRN Javier Docker, MD   30 mL at 07/06/13 2350  . amiodarone (PACERONE) tablet 200 mg  200 mg Oral Daily Burnell Blanks, MD      . apixaban Arne Cleveland) tablet 5 mg  5 mg Oral BID Burnell Blanks, MD      . carvedilol (COREG) tablet 3.125 mg  3.125 mg Oral BID WC Burnell Blanks, MD      . digoxin (LANOXIN) tablet 0.0625 mg  0.0625 mg Oral Daily Burnell Blanks, MD      . ezetimibe (ZETIA) tablet 10 mg  10 mg Oral QHS Burnell Blanks, MD      . fluticasone (FLONASE) 50 MCG/ACT nasal spray 2 spray  2 spray Each Nare Daily PRN Burnell Blanks, MD      . folic acid-pyridoxine-cyancobalamin (FOLTX) 2.5-25-2 MG per tablet 1 tablet  1 tablet Oral Daily Burnell Blanks, MD      . furosemide (LASIX) tablet 40 mg  40 mg Oral Daily Burnell Blanks, MD      . hyoscyamine (LEVBID) 0.375 MG 12 hr tablet 0.375 mg  0.375 mg Oral BID Burnell Blanks, MD      . levothyroxine (SYNTHROID, LEVOTHROID) tablet 200 mcg  200 mcg Oral QAC breakfast Burnell Blanks, MD      . losartan (COZAAR) tablet 12.5 mg  12.5 mg Oral Daily Burnell Blanks, MD      . nitroGLYCERIN (NITROSTAT) SL tablet 0.4 mg  0.4 mg Sublingual Q5 min PRN Burnell Blanks, MD      . omega-3 acid ethyl esters (LOVAZA) capsule 1 g  1 g Oral  Daily Burnell Blanks, MD      . pantoprazole (PROTONIX) EC tablet 40 mg  40 mg Oral BID PRN Burnell Blanks, MD      . polyethylene glycol (MIRALAX / GLYCOLAX) packet 17 g  17 g Oral Daily PRN Burnell Blanks, MD      . raloxifene (EVISTA) tablet 60 mg  60 mg Oral Daily Burnell Blanks, MD      . sodium chloride 0.9 % injection 3 mL  3 mL Intravenous Q12H Burnell Blanks, MD      . sodium chloride 0.9 % injection 3 mL  3 mL Intravenous PRN Burnell Blanks, MD      . spironolactone (ALDACTONE) tablet 25 mg  25 mg Oral Daily Burnell Blanks, MD        Filed Vitals:   07/06/13 2000 07/06/13 2040 07/06/13 2130 07/07/13 0509  BP: 101/50 115/73 100/64 108/56  Pulse: 65 61  61  Temp: 97.7 F (36.5 C)   97.8 F (36.6 C)  TempSrc:    Oral  Resp:  16   16  Height:      Weight:      SpO2: 100% 100%  98%    PHYSICAL EXAM General: NAD Neck: No JVD, no thyromegaly.  Lungs: Clear to auscultation bilaterally with normal respiratory effort. CV: Nondisplaced PMI.  Regular rate and rhythm, normal S1/S2, no S3/S4, no murmur.  No pretibial edema.  No carotid bruit.  Normal pedal pulses.  Abdomen: Soft, nontender, no hepatosplenomegaly, no distention.  Neurologic: Alert and oriented x 3.  Psych: Normal affect. Extremities: No clubbing or cyanosis.   TELEMETRY: Reviewed telemetry pt in sinus rhythm, few PVC's, artifact vs short run of atrial tach.  LABS: Basic Metabolic Panel:  Recent Labs  07/06/13 1549 07/07/13 0605  NA 138 135*  K 4.3 3.7  CL 104 101  CO2  --  19  GLUCOSE 132* 94  BUN 16 17  CREATININE 1.40* 1.17*  CALCIUM  --  9.1   Liver Function Tests: No results found for this basename: AST, ALT, ALKPHOS, BILITOT, PROT, ALBUMIN,  in the last 72 hours No results found for this basename: LIPASE, AMYLASE,  in the last 72 hours CBC:  Recent Labs  07/06/13 1539 07/06/13 1549 07/07/13 0605  WBC 3.2*  --  3.2*  NEUTROABS 1.2*  --    --   HGB 15.3* 16.3* 13.2  HCT 43.2 48.0* 38.2  MCV 100.9*  --  100.0  PLT 141*  --  120*   Cardiac Enzymes: No results found for this basename: CKTOTAL, CKMB, CKMBINDEX, TROPONINI,  in the last 72 hours BNP: No components found with this basename: POCBNP,  D-Dimer: No results found for this basename: DDIMER,  in the last 72 hours Hemoglobin A1C: No results found for this basename: HGBA1C,  in the last 72 hours Fasting Lipid Panel: No results found for this basename: CHOL, HDL, LDLCALC, TRIG, CHOLHDL, LDLDIRECT,  in the last 72 hours Thyroid Function Tests: No results found for this basename: TSH, T4TOTAL, FREET3, T3FREE, THYROIDAB,  in the last 72 hours Anemia Panel: No results found for this basename: VITAMINB12, FOLATE, FERRITIN, TIBC, IRON, RETICCTPCT,  in the last 72 hours  RADIOLOGY: Ct Head Wo Contrast  07/06/2013   CLINICAL DATA:  Headache and left-sided visual changes.  EXAM: CT HEAD WITHOUT CONTRAST  TECHNIQUE: Contiguous axial images were obtained from the base of the skull through the vertex without intravenous contrast.  COMPARISON:  Head CT 04/07/2006.  FINDINGS: Mild cerebral atrophy. Patchy and confluent areas of decreased attenuation are noted throughout the deep and periventricular white matter of the cerebral hemispheres bilaterally, compatible with chronic microvascular ischemic disease. No acute intracranial abnormalities. Specifically, no evidence of acute intracranial hemorrhage, no definite findings of acute/subacute cerebral ischemia, no mass, mass effect, hydrocephalus or abnormal intra or extra-axial fluid collections. Visualized paranasal sinuses and mastoids are well pneumatized. No acute displaced skull fractures are identified.  IMPRESSION: 1. No acute intracranial abnormalities. 2. Mild cerebral atrophy with chronic microvascular ischemic changes in the cerebral white matter, similar to the prior study.   Electronically Signed   By: Vinnie Langton M.D.   On:  07/06/2013 16:07   Dg Chest Port 1 View  07/06/2013   CLINICAL DATA:  Code STEMI.  EXAM: PORTABLE CHEST - 1 VIEW  COMPARISON:  Chest x-ray 06/04/2013.  FINDINGS: Lung volumes are normal. No consolidative airspace disease. No definite pleural effusions (although the costophrenic sulci are incompletely visualized bilaterally). No evidence of pulmonary edema. Heart size is normal. Upper mediastinal  contours are within normal limits. Atherosclerosis in the thoracic aorta. Post vertebroplasty changes throughout the upper and mid thoracic spine.  IMPRESSION: 1. No radiographic evidence of acute cardiopulmonary disease. 2. Atherosclerosis.   Electronically Signed   By: Vinnie Langton M.D.   On: 07/06/2013 16:26   Cath results: Impression:  1. Double vessel CAD with patent stents mid LAD and OM1, mild disease RCA  2. 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 Known ischemic cardiomyopathy   Recommendations: Will admit to telemetry unit. No evidence of ACS. After reviewing the films today and old cath films, she has slightly decreased flow in her coronaries without obstructive large vessel disease. This is suggestive of micro-vascular disease. She may benefit from augmentation of therapy to treat microvascular disease.   ASSESSMENT AND PLAN: 1. CAD: Cath consistent with microvascular disease and stent patency. Given soft BP, will avoid nitrates. She is currently free of chest pain. Would consider Ranexa if symptoms recur. 2. Chronic systolic heart failure: Appears euvolemic. No change to diuretic regimen. Awaiting CRT-D. On Lasix, spironolactone, digoxin (level slightly low), Coreg, and ARB. 3. Atrial tachycardia: Is being considered for ablation. Currently being treated with amiodarone. 4. Left periorbital/frontal headache: Head CT showed only chronic microvascular ischemic changes. Treat symptoms. 5. Paroxysmal atrial fibrillation: On Eliquis, digoxin, and amiodarone. 6. Hyperlipidemia: History of elevated  transaminases. On Zetia and Lovaza.   Kate Sable, M.D., F.A.C.C.

## 2013-07-07 NOTE — Progress Notes (Signed)
Utilization review completed.  

## 2013-07-08 ENCOUNTER — Other Ambulatory Visit: Payer: Self-pay | Admitting: *Deleted

## 2013-07-08 ENCOUNTER — Encounter (HOSPITAL_COMMUNITY): Payer: Self-pay | Admitting: General Practice

## 2013-07-08 DIAGNOSIS — R51 Headache: Principal | ICD-10-CM

## 2013-07-08 DIAGNOSIS — Z8673 Personal history of transient ischemic attack (TIA), and cerebral infarction without residual deficits: Secondary | ICD-10-CM

## 2013-07-08 DIAGNOSIS — R519 Headache, unspecified: Secondary | ICD-10-CM

## 2013-07-08 DIAGNOSIS — I213 ST elevation (STEMI) myocardial infarction of unspecified site: Secondary | ICD-10-CM

## 2013-07-08 MED ORDER — ONDANSETRON HCL 8 MG PO TABS
8.0000 mg | ORAL_TABLET | Freq: Two times a day (BID) | ORAL | Status: DC
  Administered 2013-07-08: 8 mg via ORAL
  Filled 2013-07-08 (×2): qty 1
  Filled 2013-07-08: qty 2

## 2013-07-08 MED ORDER — NITROGLYCERIN 0.4 MG SL SUBL: 0.4000 mg | SUBLINGUAL_TABLET | SUBLINGUAL | Status: DC | PRN

## 2013-07-08 NOTE — Discharge Summary (Signed)
Discharge Summary   Patient ID: [name] MRN: [id number], DOB/AGE: 1950/06/26 63 y.o. Admit date: 07/06/2013 D/C date:     07/08/2013  Primary Cardiologist: Dr. Caryl Comes  Principal Problem:   STEMI (ST elevation myocardial infarction) Active Problems:   HYPERLIPIDEMIA   Chronic combined systolic and diastolic heart failure, NYHA class 3   OSTEOARTHRITIS   Ischemic cardiomyopathy   CKD (chronic kidney disease)   GERD (gastroesophageal reflux disease)   CAD (coronary artery disease)   HTN (hypertension)   Hypothyroidism   PAF (paroxysmal atrial fibrillation)   History of TIA (transient ischemic attack)   Headache above the eye region   Admission Dates: 07/06/13-07/08/13 Discharge Diagnosis: Inferolateral STEMI s/p emergent cardiac catheterization. No culprit lieson found and likely due to microvascular diease. Troponin neg x1.  HPI: [name] is a 63 y.o. female with a history of CAD s/p stents to mLAD ('03) and staged PCI w/ DES to OM1 ('03), ICM s/p ICD placement, chronic systolic CHF (EF 56-43%), HTN, GERD, CKD, HLD and paroxysmal atrial fibrillation on Eliquis who presented to the [facility] ED on 07/06/13 with chest pain and EKG changes as well as headache and recent visual disturbances. She was taken for emergent cardiac cath.   Hospital Course: [name] was taken for emergency cardiac cath which reveealed double vessel CAD with patent stents in mid LAD and OM1, mild disease in RCA, and known ischemic cardiomyopathy. There was no evidence of ACS. Old cath films were reviewed which showed that she had slightly decreased flow in her coronaries without obstructive large vessel disease. This was felt to be suggestive of micro-vascular disease and best treated medically on her same medications. She has a history of CKD and her creatinine remained at baseline (~1.17). Additionally, she remained in NSR on digoxin and amiodarone. She also continues on Eliquis for her PAF. [name] continued  to complain of intermittant periorbital headache reminiscent of her previous TIAs. Head CT was negative for acute findings and showed chronic microvascular ischemic changes. She had no other neurologic findings and the headache did improve with Vicodin. We will, however, provide a referral for neurology as an outpatient.  The patient has had an uncomplicated hospital course and is recovering well. The radial catheter site is stable. She has been seen by Dr. Martinique today and deemed ready for discharge home. She has a previously scheduled ICD change out in 2 weeks with Dr. Caryl Comes. No medication changes have been made and her discharge medications are listed below. If she continues to have recurrent chest pain, the addition of ranolazine may be discussed as an outpatient.    Discharge Vitals: Blood pressure 112/54, pulse 55, temperature 98.4 F (36.9 C), temperature source Oral, resp. rate 16, height 5\' 4"  (1.626 m), weight 140 lb 12.8 oz (63.866 kg), SpO2 99.00%.  Labs: Lab Results  Component Value Date   WBC 3.2* 07/07/2013   HGB 13.2 07/07/2013   HCT 38.2 07/07/2013   MCV 100.0 07/07/2013   PLT 120* 07/07/2013     Recent Labs Lab 07/07/13 0605  NA 135*  K 3.7  CL 101  CO2 19  BUN 17  CREATININE 1.17*  CALCIUM 9.1  GLUCOSE 94    Lab Results  Component Value Date   CHOL 226* 04/22/2013   HDL 119 04/22/2013   LDLCALC 93 04/22/2013   TRIG 68 04/22/2013    Diagnostic Studies/Procedures   Ct Head Wo Contrast  07/06/2013 CLINICAL DATA: Headache and left-sided visual  changes. EXAM: CT HEAD WITHOUT CONTRAST TECHNIQUE: Contiguous axial images were obtained from the base of the skull through the vertex without intravenous contrast. COMPARISON: Head CT 04/07/2006. FINDINGS: Mild cerebral atrophy. Patchy and confluent areas of decreased attenuation are noted throughout the deep and periventricular white matter of the cerebral hemispheres bilaterally, compatible with chronic microvascular ischemic  disease. No acute intracranial abnormalities. Specifically, no evidence of acute intracranial hemorrhage, no definite findings of acute/subacute cerebral ischemia, no mass, mass effect, hydrocephalus or abnormal intra or extra-axial fluid collections. Visualized paranasal sinuses and mastoids are well pneumatized. No acute displaced skull fractures are identified. IMPRESSION: 1. No acute intracranial abnormalities. 2. Mild cerebral atrophy with chronic microvascular ischemic changes in the cerebral white matter, similar to the prior study.     Dg Chest Port 1 View  07/06/2013 CLINICAL DATA: Code STEMI. EXAM: PORTABLE CHEST - 1 VIEW COMPARISON: Chest x-ray 06/04/2013. FINDINGS: Lung volumes are normal. No consolidative airspace disease. No definite pleural effusions (although the costophrenic sulci are incompletely visualized bilaterally). No evidence of pulmonary edema. Heart size is normal. Upper mediastinal contours are within normal limits. Atherosclerosis in the thoracic aorta. Post vertebroplasty changes throughout the upper and mid thoracic spine. IMPRESSION: 1. No radiographic evidence of acute cardiopulmonary disease. 2. Atherosclerosis.     Cardiac Catheterization Operative Report  [name]  [id number]  4/18/20154:45 PM  [name], MD  Procedure Performed:  1. Left Heart Catheterization 2. Selective Coronary Angiography Operator: Lauree Chandler, MD  Arterial access site: Right radial artery.  Indication:62 yo female with history of CAD, ICM, ICD in place on Xarelto, chronic systolic CHF, HTN, GERD, CKD, HLD, paroxysmal atrial fibrillation on Eliquis to ED with c/o chest pain, headache and recent visual disturbance. EKG with ST elevation inferior and anterolateral leads. Pt has ongoing chest pain. Records reviewed and prior EKG are abnormal but with new changes and ongoing pain, cardiac cath was indicated. Head CT negative for acute intracranial hemorrhage. Pt denies SOB but  does note chest pain that has felt like her heartburn for past 24 hours. Pt taken to cath lab for emergent cardiac cath.  Procedure Details:  The risks, benefits, complications, treatment options, and expected outcomes were discussed with the patient. Emergency consent placed on the chart. The patient was brought to the cath lab from the ED. The patient was further sedated with Versed and Fentanyl. The right wrist was assessed with an Allens test which was positive. The right wrist was prepped and draped in a sterile fashion. 1% lidocaine was used for local anesthesia. Using the modified Seldinger access technique, a 5 French sheath was placed in the right radial artery. 3 mg Verapamil was given through the sheath. 3500 units IV heparin was given. Standard diagnostic catheters were used to perform selective coronary angiography. A pigtail catheter was used to cross the aortic valve and measure LV pressures. The sheath was removed from the right radial artery and a Terumo hemostasis band was applied at the arteriotomy site on the right wrist.  There were no immediate complications. The patient was taken to the recovery area in stable condition.  Hemodynamic Findings:  Central aortic pressure: 151/65  Left ventricular pressure: 109/2/0  Angiographic Findings:  Left main: Distal 20% stenosis.  Left Anterior Descending Artery: Large caliber vessel that courses to the apex. The proximal vessel has calcification with 20% stenosis. The mid vessel has a patent stented segment with minimal restenosis. The distal vessel has a 30% stenosis. The first diagonal branch  is small in caliber with ostial 80% stenosis, unchanged from last cath. The second diagonal branch is small in caliber with ostial 30% stenosis.  Circumflex Artery: Large caliber vessel with large caliber first obtuse marginal branch and moderate caliber second obtuse marginal branch. There is a patent stent in the first OM branch with minimal restenosis.  The mid AV groove Circumflex has 20% stenosis.  Right Coronary Artery: Large caliber dominant vessel with diffuse 30% stenosis in the proximal and mid vessel. There is diffuse calcification in the proximal and mid vessel. There is subtle flow disturbance in the RCA suggestive of microvascular disease.  Left Ventricular Angiogram: Deferred.  Impression:  1. Double vessel CAD with patent stents mid LAD and OM1, mild disease RCA  2. 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 Known ischemic cardiomyopathy  Recommendations: Will admit to telemetry unit. No evidence of ACS. After reviewing the films today and old cath films, she has slightly decreased flow in her coronaries without obstructive large vessel disease. This is suggestive of micro-vascular disease. She may benefit from augmentation of therapy to treat microvascular disease. Resume home meds. If any further neurological changes, will need Neuro consult.    Discharge Medications     Medication List         amiodarone 200 MG tablet  Commonly known as:  PACERONE  Take 200 mg by mouth daily.     carvedilol 6.25 MG tablet  Commonly known as:  COREG  Take 0.5 tablets (3.125 mg total) by mouth 2 (two) times daily with a meal.     digoxin 0.125 MG tablet  Commonly known as:  LANOXIN  Take 0.5 tablets (0.0625 mg total) by mouth daily.     ELIQUIS 5 MG Tabs tablet  Generic drug:  apixaban  Take 5 mg by mouth 2 (two) times daily.     ezetimibe 10 MG tablet  Commonly known as:  ZETIA  Take 10 mg by mouth at bedtime.     fexofenadine 180 MG tablet  Commonly known as:  ALLEGRA  Take 180 mg by mouth daily as needed for allergies.     fluticasone 50 MCG/ACT nasal spray  Commonly known as:  FLONASE  Place 2 sprays into both nostrils daily as needed for allergies.     folic acid-pyridoxine-cyancobalamin 2.5-25-2 MG Tabs  Commonly known as:  FOLTX  Take 1 tablet by mouth daily.     furosemide 40 MG tablet  Commonly known as:  LASIX  Take  1 tablet (40 mg total) by mouth daily.     LEVBID 0.375 MG 12 hr tablet  Generic drug:  hyoscyamine  Take 0.375 mg by mouth every 12 (twelve) hours as needed for cramping.     levothyroxine 200 MCG tablet  Commonly known as:  SYNTHROID, LEVOTHROID  Take 200 mcg by mouth daily before breakfast.     losartan 25 MG tablet  Commonly known as:  COZAAR  Take 0.5 tablets (12.5 mg total) by mouth daily.     nitroGLYCERIN 0.4 MG SL tablet  Commonly known as:  NITROSTAT  Place 0.4 mg under the tongue every 5 (five) minutes as needed for chest pain.     omega-3 acid ethyl esters 1 G capsule  Commonly known as:  LOVAZA  Take 1 g by mouth daily.     pantoprazole 40 MG tablet  Commonly known as:  PROTONIX  Take 40 mg by mouth 2 (two) times daily as needed (gerd).     polyethylene glycol  packet  Commonly known as:  MIRALAX / GLYCOLAX  Take 17 g by mouth daily as needed (for constipation).     raloxifene 60 MG tablet  Commonly known as:  EVISTA  Take 60 mg by mouth daily.     spironolactone 25 MG tablet  Commonly known as:  ALDACTONE  Take 25 mg by mouth daily.        Disposition   The patient will be discharged in stable condition to home.  Future Appointments Provider Department Dept Phone   07/10/2013 1:30 PM [facility] [place] [phone]   07/25/2013 7:40 AM [facility] [place] [phone]   08/14/2013 3:00 PM [facility] [place] [facility] [phone]     Follow-up Information   Follow up with [name], MD.   Specialty:  Internal Medicine   Contact information:   [address] [address] [phone]       Follow up with Virl Axe, MD On 07/10/2013. (@ 1:30pm)    Specialty:  Cardiology   Contact information:   [id number] [address] [phone]       Follow up with [facility] [place]. (Our  office will call you to schedule an appointment for a neurology consult. If you do not hear from [place] in the next couple days. Please call [phone])    Specialty:  Neurology   Contact information:   [place], [place] [address] [place] [address] [phone]        Duration of Discharge Encounter: Greater than 30 minutes including physician and PA time.  Signed, Perry Mount PA-C 07/08/2013, 2:17 PM

## 2013-07-08 NOTE — Progress Notes (Signed)
Patient Name: [name] Date of Encounter: 07/08/2013     Active Problems:   Chronic combined systolic and diastolic heart failure, NYHA class 3   Ischemic cardiomyopathy   HTN (hypertension)   PAF (paroxysmal atrial fibrillation)   Chest pain     63 yo female with history of CAD s/p stents to mLAD and OM1, ICM s/p ICD placement, chronic systolic CHF (EF 56-38%), HTN, GERD, CKD, HLD, paroxysmal atrial fibrillation on Eliquis who presented to ED on 07/06/13 with chest pain and EKG changes as well as headache and recent visual disturbance. She was taken for emergent cardiac cath.    SUBJECTIVE  No CP or SOB. She does have some mild nausea and has left periorbital headache last night. Currently sx free.    CURRENT MEDS . amiodarone  200 mg Oral Daily  . apixaban  5 mg Oral BID  . carvedilol  3.125 mg Oral BID WC  . digoxin  0.0625 mg Oral Daily  . ezetimibe  10 mg Oral QHS  . folic acid-pyridoxine-cyancobalamin  1 tablet Oral Daily  . furosemide  40 mg Oral Daily  . hyoscyamine  0.375 mg Oral BID  . levothyroxine  200 mcg Oral QAC breakfast  . losartan  12.5 mg Oral Daily  . omega-3 acid ethyl esters  1 g Oral Daily  . raloxifene  60 mg Oral Daily  . sodium chloride  3 mL Intravenous Q12H  . spironolactone  25 mg Oral Daily    OBJECTIVE  Filed Vitals:   07/07/13 1156 07/07/13 1500 07/07/13 2116 07/08/13 0513  BP: 117/53 105/42 116/55 109/51  Pulse: 60 52 57 64  Temp: 97.9 F (36.6 C) 97.8 F (36.6 C) 98.2 F (36.8 C) 98.4 F (36.9 C)  TempSrc: Oral Oral Oral Oral  Resp: 16 16 16 16   Height:      Weight:  140 lb 8 oz (63.73 kg)  140 lb 12.8 oz (63.866 kg)  SpO2: 100% 100% 99% 99%    Intake/Output Summary (Last 24 hours) at 07/08/13 0646 Last data filed at 07/07/13 2149  Gross per 24 hour  Intake    360 ml  Output      0 ml  Net    360 ml   Filed Weights   07/06/13 1503 07/07/13 1500 07/08/13 0513  Weight: 140 lb (63.504 kg) 140 lb 8 oz (63.73 kg)  140 lb 12.8 oz (63.866 kg)    PHYSICAL EXAM General: NAD  Neck: No JVD, no thyromegaly.  Lungs: Clear to auscultation bilaterally with normal respiratory effort.  CV: Nondisplaced PMI. Regular rate and rhythm, normal S1/S2, no S3/S4, no murmur. No pretibial edema. No carotid bruit. Normal pedal pulses.  Abdomen: Soft, nontender, no hepatosplenomegaly, no distention.  Neurologic: Alert and oriented x 3.  Psych: Normal affect.  Extremities: No clubbing or cyanosis.    Accessory Clinical Findings  CBC  Recent Labs  07/06/13 1539 07/06/13 1549 07/07/13 0605  WBC 3.2*  --  3.2*  NEUTROABS 1.2*  --   --   HGB 15.3* 16.3* 13.2  HCT 43.2 48.0* 38.2  MCV 100.9*  --  100.0  PLT 141*  --  756*   Basic Metabolic Panel  Recent Labs  07/06/13 1549 07/07/13 0605  NA 138 135*  K 4.3 3.7  CL 104 101  CO2  --  19  GLUCOSE 132* 94  BUN 16 17  CREATININE 1.40* 1.17*  CALCIUM  --  9.1  TELE  NSR few PVCs HR 60s  Radiology/Studies  Ct Head Wo Contrast  07/06/2013   CLINICAL DATA:  Headache and left-sided visual changes.  EXAM: CT HEAD WITHOUT CONTRAST  TECHNIQUE: Contiguous axial images were obtained from the base of the skull through the vertex without intravenous contrast.  COMPARISON:  Head CT 04/07/2006.  FINDINGS: Mild cerebral atrophy. Patchy and confluent areas of decreased attenuation are noted throughout the deep and periventricular white matter of the cerebral hemispheres bilaterally, compatible with chronic microvascular ischemic disease. No acute intracranial abnormalities. Specifically, no evidence of acute intracranial hemorrhage, no definite findings of acute/subacute cerebral ischemia, no mass, mass effect, hydrocephalus or abnormal intra or extra-axial fluid collections. Visualized paranasal sinuses and mastoids are well pneumatized. No acute displaced skull fractures are identified.  IMPRESSION: 1. No acute intracranial abnormalities. 2. Mild cerebral atrophy with  chronic microvascular ischemic changes in the cerebral white matter, similar to the prior study.    Dg Chest Port 1 View  07/06/2013   CLINICAL DATA:  Code STEMI.  EXAM: PORTABLE CHEST - 1 VIEW  COMPARISON:  Chest x-ray 06/04/2013.  FINDINGS: Lung volumes are normal. No consolidative airspace disease. No definite pleural effusions (although the costophrenic sulci are incompletely visualized bilaterally). No evidence of pulmonary edema. Heart size is normal. Upper mediastinal contours are within normal limits. Atherosclerosis in the thoracic aorta. Post vertebroplasty changes throughout the upper and mid thoracic spine.  IMPRESSION: 1. No radiographic evidence of acute cardiopulmonary disease. 2. Atherosclerosis.    Cardiac Catheterization Operative Report  [name]  [id number]  4/18/20154:45 PM  [name], MD  Procedure Performed:  1. Left Heart Catheterization 2. Selective Coronary Angiography Operator: Lauree Chandler, MD  Arterial access site: Right radial artery.  Indication:62 yo female with history of CAD, ICM, ICD in place on Xarelto, chronic systolic CHF, HTN, GERD, CKD, HLD, paroxysmal atrial fibrillation on Eliquis to ED with c/o chest pain, headache and recent visual disturbance. EKG with ST elevation inferior and anterolateral leads. Pt has ongoing chest pain. Records reviewed and prior EKG are abnormal but with new changes and ongoing pain, cardiac cath was indicated. Head CT negative for acute intracranial hemorrhage. Pt denies SOB but does note chest pain that has felt like her heartburn for past 24 hours. Pt taken to cath lab for emergent cardiac cath.  Procedure Details:  The risks, benefits, complications, treatment options, and expected outcomes were discussed with the patient. Emergency consent placed on the chart. The patient was brought to the cath lab from the ED. The patient was further sedated with Versed and Fentanyl. The right wrist was assessed with an  Allens test which was positive. The right wrist was prepped and draped in a sterile fashion. 1% lidocaine was used for local anesthesia. Using the modified Seldinger access technique, a 5 French sheath was placed in the right radial artery. 3 mg Verapamil was given through the sheath. 3500 units IV heparin was given. Standard diagnostic catheters were used to perform selective coronary angiography. A pigtail catheter was used to cross the aortic valve and measure LV pressures. The sheath was removed from the right radial artery and a Terumo hemostasis band was applied at the arteriotomy site on the right wrist.  There were no immediate complications. The patient was taken to the recovery area in stable condition.  Hemodynamic Findings:  Central aortic pressure: 151/65  Left ventricular pressure: 109/2/0  Angiographic Findings:  Left main: Distal 20% stenosis.  Left Anterior Descending Artery: Large  caliber vessel that courses to the apex. The proximal vessel has calcification with 20% stenosis. The mid vessel has a patent stented segment with minimal restenosis. The distal vessel has a 30% stenosis. The first diagonal branch is small in caliber with ostial 80% stenosis, unchanged from last cath. The second diagonal branch is small in caliber with ostial 30% stenosis.  Circumflex Artery: Large caliber vessel with large caliber first obtuse marginal branch and moderate caliber second obtuse marginal branch. There is a patent stent in the first OM branch with minimal restenosis. The mid AV groove Circumflex has 20% stenosis.  Right Coronary Artery: Large caliber dominant vessel with diffuse 30% stenosis in the proximal and mid vessel. There is diffuse calcification in the proximal and mid vessel. There is subtle flow disturbance in the RCA suggestive of microvascular disease.  Left Ventricular Angiogram: Deferred.  Impression:  1. Double vessel CAD with patent stents mid LAD and OM1, mild disease RCA  2.  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 Known ischemic cardiomyopathy  Recommendations: Will admit to telemetry unit. No evidence of ACS. After reviewing the films today and old cath films, she has slightly decreased flow in her coronaries without obstructive large vessel disease. This is suggestive of micro-vascular disease. She may benefit from augmentation of therapy to treat microvascular disease. Resume home meds. If any further neurological changes, will need Neuro consult.    ASSESSMENT AND PLAN 63 yo female with history of CAD s/p stents to mLAD and OM1, ICM s/p ICD placement, chronic systolic CHF (EF 33-35%), HTN, GERD, CKD, HLD, paroxysmal atrial fibrillation on Eliquis who presented to ED on 07/06/13 with chest pain and EKG changes as well as headache and recent visual disturbance. She was taken for emergent cardiac cath.   Chest pain/ST elevation inferior and anterolateral leads: Emergent cardiac cath consistent with microvascular disease and stent patency. She is currently free of chest pain. Would consider Ranexa if symptoms recur.  Chronic systolic CHF/ Ischemic cardiomyopathy:  Appears euvolemic. No S/S of CHF. Resume home meds. No change to diuretic regimen. On Lasix, spironolactone, digoxin (level slightly low), Coreg, and ARB.  CKD- creat at baseline.  HTN: BP controlled. Soft.  PAF: Has been maintaining NSR on Eliquis, digoxin, and amiodarone.  Blurred vision/Headache: Pt has history of CVA. Left periorbital/frontal headache: Head CT showed only chronic microvascular ischemic changes. Treat symptoms.  Hyperlipidemia: History of elevated transaminases. On Zetia and Lovaza.   Signed, Perry Mount PA-C  Pager [phone]  Patient seen and examined and history reviewed. Agree with above findings and plan. No further chest pain. No dyspnea. Still complains of left periorbital HA. Is worried because she had similar symptoms with prior TIA. Head CT negative. Wants to see neurology.  No other neurologic findings. HA did improve with Vicodin. Will plan DC home today. She is scheduled for ICD change out in 2 weeks with Dr. Caryl Comes. We can arrange Neuro follow up as outpatient.   Ander Slade [facility] 07/08/2013 9:12 AM

## 2013-07-08 NOTE — Discharge Instructions (Signed)
Radial Site Care Refer to this sheet in the next few weeks. These instructions provide you with information on caring for yourself after your procedure. Your caregiver may also give you more specific instructions. Your treatment has been planned according to current medical practices, but problems sometimes occur. Call your caregiver if you have any problems or questions after your procedure. HOME CARE INSTRUCTIONS  You may shower the day after the procedure.Remove the bandage (dressing) and gently wash the site with plain soap and water.Gently pat the site dry.   Do not apply powder or lotion to the site.   Do not submerge the affected site in water for 3 to 5 days.   Inspect the site at least twice daily.   Do not flex or bend the affected arm for 24 hours.   No lifting over 5 pounds (2.3 kg) for 5 days after your procedure.   Do not drive home if you are discharged the same day of the procedure. Have someone else drive you.   You may drive 24 hours after the procedure unless otherwise instructed by your caregiver.  What to expect:  Any bruising will usually fade within 1 to 2 weeks.   Blood that collects in the tissue (hematoma) may be painful to the touch. It should usually decrease in size and tenderness within 1 to 2 weeks.  SEEK IMMEDIATE MEDICAL CARE IF:  You have unusual pain at the radial site.   You have redness, warmth, swelling, or pain at the radial site.   You have drainage (other than a small amount of blood on the dressing).   You have chills.   You have a fever or persistent symptoms for more than 72 hours.   You have a fever and your symptoms suddenly get worse.   Your arm becomes pale, cool, tingly, or numb.   You have heavy bleeding from the site. Hold pressure on the site.      Acute Coronary Syndrome Acute coronary syndrome (ACS) is an urgent problem in which the blood and oxygen supply to the heart is critically deficient. ACS requires  hospitalization because one or more coronary arteries may be blocked. ACS represents a range of conditions including:  Previous angina that is now unstable, lasts longer, happens at rest, or is more intense.  A heart attack, with heart muscle cell injury and death. There are three vital coronary arteries that supply the heart muscle with blood and oxygen so that it can pump blood effectively. If blockages to these arteries develop, blood flow to the heart muscle is reduced. If the heart does not get enough blood, angina may occur as the first warning sign. SYMPTOMS   The most common signs of angina include:  Tightness or squeezing in the chest.  Feeling of heaviness on the chest.  Discomfort in the arms, neck, or jaw.  Shortness of breath and nausea.  Cold, wet skin.  Angina is usually brought on by physical effort or excitement which increase the oxygen needs of the heart. These states increase the blood flow needs of the heart beyond what can be delivered. TREATMENT   Medicines to help discomfort may include nitroglycerin (nitro) in the form of tablets or a spray for rapid relief, or longer-acting forms such as cream, patches, or capsules. (Be aware that there are many side effects and possible interactions with other drugs).  Other medicines may be used to help the heart pump better.  Procedures to open blocked arteries including angioplasty  or stent placement to keep the arteries open.  Open heart surgery may be needed when there are many blockages or they are in critical locations that are best treated with surgery. HOME CARE INSTRUCTIONS   Avoid smoking.  Take one baby or adult aspirin daily, if your caregiver advises. This helps reduce the risk of a heart attack.  It is very important that you follow the angina treatment prescribed by your caregiver. Make arrangements for proper follow-up care.  Eat a heart healthy diet with salt and fat restrictions as  advised.  Regular exercise is good for you as long as it does not cause discomfort. Do not begin any new type of exercise until you check with your caregiver.  If you are overweight, you should lose weight.  Try to maintain normal blood lipid levels.  Keep your blood pressure under control as recommended by your caregiver.  You should tell your caregiver right away about any increase in the severity or frequency of your chest discomfort or angina attacks. When you have angina, you should stop what you are doing and sit down. This may bring relief in 3 to 5 minutes. If your caregiver has prescribed nitro, take it as directed.  If your caregiver has given you a follow-up appointment, it is very important to keep that appointment. Not keeping the appointment could result in a chronic or permanent injury, pain, and disability. If there is any problem keeping the appointment, you must call back to this facility for assistance. SEEK IMMEDIATE MEDICAL CARE IF:   You develop nausea, vomiting, or shortness of breath.  You feel faint, lightheaded, or pass out.  Your chest discomfort gets worse.  You are sweating or experience sudden profound fatigue.  You do not get relief of your chest pain after 3 doses of nitro.  Your discomfort lasts longer than 15 minutes. MAKE SURE YOU:   Understand these instructions.  Will watch your condition.  Will get help right away if you are not doing well or get worse. Document Released: 03/07/2005 Document Revised: 05/30/2011 Document Reviewed: 10/09/2007 [facility] Patient Information 2014 [place].  Chest Wall Pain Chest wall pain is pain felt in or around the chest bones and muscles. It may take up to 6 weeks to get better. It may take longer if you are active. Chest wall pain can happen on its own. Other times, things like germs, injury, coughing, or exercise can cause the pain. HOME CARE   Avoid activities that make you tired or cause pain. Try  not to use your chest, belly (abdominal), or side muscles. Do not use heavy weights.  Put ice on the sore area.  Put ice in a plastic bag.  Place a towel between your skin and the bag.  Leave the ice on for 15-20 minutes for the first 2 days.  Only take medicine as told by your doctor. GET HELP RIGHT AWAY IF:   You have more pain or are very uncomfortable.  You have a fever.  Your chest pain gets worse.  You have new problems.  You feel sick to your stomach (nauseous) or throw up (vomit).  You start to sweat or feel lightheaded.  You have a cough with mucus (phlegm).  You cough up blood. MAKE SURE YOU:   Understand these instructions.  Will watch your condition.  Will get help right away if you are not doing well or get worse. Document Released: 08/24/2007 Document Revised: 05/30/2011 Document Reviewed: 11/01/2010 [employer] Patient Information  2014 [place], [place].  Coronary Artery Disease Coronary artery disease (CAD) is a process in which the heart (coronary) arteries narrow or become blocked from the development of atherosclerosis. Atherosclerosis is a disease in which plaque builds up on the inside of the heart arteries (coronary arteries). Plaque is made up of fats (lipids), cholesterol, calcium, and fibrous tissue. CAD can lead to a heart attack (myocardial infarction, MI). An MI can lead to heart failure, cardiogenic shock, or sudden cardiac death. CAD can cause an MI through:  Plaque buildup that can severely narrow or block the coronary arteries and diminish blood flow.  Plaque that can become unstable and "rupture." Unstable plaque that ruptures within a coronary artery can form a clot and cause a sudden (acute) blockage. RISK FACTORS Many risk factors contribute to the development of CAD. These include:  High cholesterol (dyslipidemia) levels.  High blood pressure (hypertension).  Smoking.  Diabetes.  Age.  Gender. Men can develop CAD earlier in life  than women.  Family history.  Inactivity or lack of regular physical or aerobic exercise.  A diet high in saturated fats.  Chronic kidney disease. SYMPTOMS  When a coronary artery is narrowed or blocked, an MI can occur. MI symptoms can include:  Chest pain (agina). Angina can occur by itself or it can also occur with pain in the neck, arm, jaw, or in the upper, middle back (mid-scapular pain).  Profuse sweating (diaphoresis) without physical activity or movement.  Shortness of breath (dyspnea).  Irregular heartbeats (palpitations) that feel like your heart is skipping beats or is beating very fast.  Nausea.  Epigastric pain. Epigastric pain may occur as "heartburn."  Tiredness (malaise). This can especially be present in the elderly. Women can have different (atypical) symptoms other than classic angina.  DIAGNOSIS  The diagnosis of CAD may include:  An electrocardiography (ECG). An ECG does not diagnose CAD, but it is usefull in the detection of a sudden (acute) MI or as a marker for a previous MI. Depending on which heart (coronary) artery may be blocked, an ECG may not pick up an MI pattern.  Exercise stress test. A stress test can be performed at rest for people who are unable to do an exercise stress test. A stress test will only be abnormal if one or more of the large coronary arteries is significantly blocked.  Blood tests. Tests may include samples to detect heart muscle damage (such as troponin levels). Other tests may include cholesterol checks and an inflammation test (high-sensitivity C-reactive protein, hs-CRP).  Coronary angiography.  Screening people who have peripheral vascular disease (PAD). These people often times have CAD. TREATMENT  The treatment of CAD includes the following:  Lifestyle changes such as:  Following a heart-healthy diet. A registered dietitian can you help educate you on healthy food options and changes.  Quiting smoking.  Following  an exercise program approved by your caregiver.  Maintaining a healthy weight. Lose weight as approved by your caregiver.  Medicines to help control your blood pressure, cholesterol level, angina, and blood clotting. Medicines may include beta-blockers, ACE inhibitors, statins, nitrates, and anti-platelet medicines.  If you have a heart stent and are taking anti-platelet medicine, it is important to not suddenly stop taking this medicine. Suddenly stopping anti-platelet medicine can result in an MI. Talk with your caregiver before stopping medicine or if you cannot afford your medicine.  If the coronary arteries are significantly blocked, surgery may be needed. This can include:  Percutaneous coronary intervention (PCI)  with or without stent placement.  Coronary artery bypass graft surgery (CABG). SEEK IMMEDIATE MEDICAL CARE IF:   You develop MI symptoms. This is a medical emergency. Get help at once. Call your local emergency service (911 in the [place] immediately. Do not drive yourself to the clinic or hospital. MI symptoms can include:  Angina or pain that occurs in the neck, arm, jaw, or in the upper middle back.  Profuse sweating without cause.  Shortness of breath or difficulty breathing without cause.  Unexplained nausea or epigastric pain that feels like heartburn. Document Released: 05/30/2011 Document Reviewed: 05/30/2011 [facility] Patient Information 2014 [place].

## 2013-07-08 NOTE — Discharge Summary (Signed)
Patient seen and examined and history reviewed. Agree with above findings and plan. See my earlier rounding note   Marykay Lex 07/08/2013 2:30 PM

## 2013-07-08 NOTE — Progress Notes (Signed)
Reviewed discharge instructions with patient and she stated her understanding.  Discharged home via wheelchair.  Sanda Linger

## 2013-07-09 ENCOUNTER — Telehealth: Payer: Self-pay | Admitting: Internal Medicine

## 2013-07-09 NOTE — Telephone Encounter (Signed)
LVM for patient to call us and schedule her ED follow-up, per ED AVS.

## 2013-07-09 NOTE — Telephone Encounter (Signed)
Erin with [facility] called to request a verbal order for her to continue home PT 2x a week for 6 weeks and to add speech therapy. She can be reached back at [phone] with verbal ok for orders.   Junie Panning states that the patient was d/c from the hospital last night, 4/20, and was admitted for a cardiac condition but was also seen for neurological symptoms while she was there.   She was referred to f/u with Neurology outpatient and is not able to get an appointment with them until 08/07/2013.  Junie Panning just wants for Dr. Ronnald Ramp to be aware.

## 2013-07-10 ENCOUNTER — Ambulatory Visit (HOSPITAL_COMMUNITY)
Admission: RE | Admit: 2013-07-10 | Discharge: 2013-07-10 | Disposition: A | Discharge status: Home / Self Care | Payer: Medicare Other | Source: Ambulatory Visit | Attending: Internal Medicine | Admitting: Internal Medicine

## 2013-07-10 VITALS — BP 103/69 | HR 68 | Resp 18 | Wt 139.1 lb

## 2013-07-10 DIAGNOSIS — I4719 Other supraventricular tachycardia: Secondary | ICD-10-CM

## 2013-07-10 DIAGNOSIS — I4891 Unspecified atrial fibrillation: Secondary | ICD-10-CM

## 2013-07-10 DIAGNOSIS — I2510 Atherosclerotic heart disease of native coronary artery without angina pectoris: Secondary | ICD-10-CM

## 2013-07-10 DIAGNOSIS — I498 Other specified cardiac arrhythmias: Secondary | ICD-10-CM

## 2013-07-10 DIAGNOSIS — I471 Supraventricular tachycardia: Secondary | ICD-10-CM

## 2013-07-10 DIAGNOSIS — N189 Chronic kidney disease, unspecified: Secondary | ICD-10-CM

## 2013-07-10 DIAGNOSIS — I480 Paroxysmal atrial fibrillation: Secondary | ICD-10-CM

## 2013-07-10 DIAGNOSIS — I5042 Chronic combined systolic (congestive) and diastolic (congestive) heart failure: Secondary | ICD-10-CM

## 2013-07-10 NOTE — Patient Instructions (Signed)
No change in medications.  Follow up in 6 weeks.  Call if you have any issues.  Do the following things EVERYDAY: 1) Weigh yourself in the morning before breakfast. Write it down and keep it in a log. 2) Take your medicines as prescribed 3) Eat low salt foods-Limit salt (sodium) to 2000 mg per day.  4) Stay as active as you can everyday 5) Limit all fluids for the day to less than 2 liters

## 2013-07-10 NOTE — Progress Notes (Signed)
Patient ID: [name], female   DOB: Apr 09, 1950, 63 y.o.   MRN: [id number]  PCP: Dr. [name] ([facility] [place]) Primary Cardiologist: Dr. Tamala Julian EP: Dr. Caryl Comes and Dr Rayann Heman (atrial tachy) EP: Dr. Ola Sp[facility])   HPI: [name] is a 63 year old woman with an ICM (s/p ICD implantation in 2005; gen change 2006; dual chamber upgrade 2014 with subsequent extraction due to infection 11/2012), EF 20-25%, CAD last intervention 2004, last cath 2012 (patent the LAD and circumflex stents), myoview 04/2013 with no ischemia), prior CVA, chronic kidney disease and atrial arrhythmias.  Admitted 3/17-3/24/15 for acute rerespiratory distress, hypotension, tachycardia, and lactic acidosis of 6. EP consulted for Atrial Tach and at that time she had reverted back to NSR. AV node ablation and BIV considered, however due to multiple medical problems amiodarone was thought to be best option. She was later taken to cath lab for [place] which showed elevated right/left filling pressures and low cardiac cath. Post cath she was placed on Milrinone and diuresed with IV lasix. D/C weight 139 lbs.  Admitted 4/18-4/20/15 for visual changes and c/o CP. EKG with ST elevation inferior and anterolateral leads and was taken to cath lab showing double vessel CAD with patent stents mid LAD and OM1, mild disease RCA and possible micro-vascular disease.    [facility] F/U: Last visit digoxin was cut back to 0.0625 mg daily. She saw Dr. Caryl Comes on 06/24/13 and was scheduled for CRT-D implantation in May, however on 4/18 was brought to the hospital for c/o CP and visual disturbances. She was referred to neurology outpatient. Went home from the hospital yesterday and is doing ok. Denies SOB, orthopnea, PND or CP. No longer having visual disturbances, but think's her vision is not as good as it used to be. Slight headache but not as severe as Saturday. Weight at home 139-140 lbs. No dizziness. SBPs at home 100/60. Following a low salt diet and  drinking less than 2L a day. Fell yesterday not sure if she caught her foot on something or what happened, [facility] was there. Very fatigued.    ROS: All systems negative except as listed in HPI, PMH and Problem List.  Past Medical History  Diagnosis Date  . Spinal stenosis   . Numbness of foot   . Diverticulitis     a. s/p partial colectomy 1/12 with reversal in July 2012.  . Colitis, ischemic   . DJD (degenerative joint disease)   . Ischemic cardiomyopathy     a. Echo 06/16/10: EF 25-30%, anteroseptal and apical hypokinesis, moderate AI, mild MR.  . Chronic systolic heart failure   . LV (left ventricular) mural thrombus   . CKD (chronic kidney disease), stage III   . GERD (gastroesophageal reflux disease)   . Hypertension   . Hypothyroidism   . Hyperlipidemia   . Compression fracture     a. of L2  . 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 2/2 atrial fibrillation with RVR in the context of hypokalemia  . PAF (paroxysmal atrial fibrillation)     a. on Eliquis, digoxin, and amiodarone  . Anemia   . H/O hiatal hernia   . Sinus bradycardia   . Mitral regurgitation   . CHF (congestive heart failure)   . Chronic lower back pain   . CAD (coronary artery disease)     a.  Ant MI 8/03 with stenting to LAD;  b. staged PCI w/ DES to OM1 8/03;  c. s/p DES to LAD 7/04;  d.  multiple caths in past (chronically abnl ECG); e. cardiac cath 3/12: LAD stent patent, distal LAD 40-50%, small ostial D1 80%, ostial D2 60%, OM-1 stent patent (20-30%), proximal RCA 50%, prox- mRCA 40-50%; f. cath 2012 nonobs g. cath 06/2013 s/p STEMI- microvascular dz- treated medically   . History of TIA (transient ischemic attack)     Current Outpatient Prescriptions  Medication Sig Dispense Refill  . amiodarone (PACERONE) 200 MG tablet Take 200 mg by mouth daily.      [name] apixaban (ELIQUIS) 5 MG TABS tablet Take 5 mg by mouth 2 (two) times daily.      . carvedilol (COREG) 6.25 MG  tablet Take 0.5 tablets (3.125 mg total) by mouth 2 (two) times daily with a meal.  60 tablet  6  . digoxin (LANOXIN) 0.125 MG tablet Take 0.5 tablets (0.0625 mg total) by mouth daily.  45 tablet  3  . ezetimibe (ZETIA) 10 MG tablet Take 10 mg by mouth at bedtime.       . fexofenadine (ALLEGRA) 180 MG tablet Take 180 mg by mouth daily as needed for allergies.       . fluticasone (FLONASE) 50 MCG/ACT nasal spray Place 2 sprays into both nostrils daily as needed for allergies.       . folic acid-pyridoxine-cyancobalamin (FOLTX) 2.5-25-2 MG TABS Take 1 tablet by mouth daily.       . furosemide (LASIX) 40 MG tablet Take 1 tablet (40 mg total) by mouth daily.  30 tablet  6  . hyoscyamine (LEVBID) 0.375 MG 12 hr tablet Take 0.375 mg by mouth every 12 (twelve) hours as needed for cramping.      [name] levothyroxine (SYNTHROID, LEVOTHROID) 200 MCG tablet Take 200 mcg by mouth daily before breakfast.       . losartan (COZAAR) 25 MG tablet Take 0.5 tablets (12.5 mg total) by mouth daily.  30 tablet  6  . nitroGLYCERIN (NITROSTAT) 0.4 MG SL tablet Place 0.4 mg under the tongue every 5 (five) minutes as needed for chest pain.       [name] omega-3 acid ethyl esters (LOVAZA) 1 G capsule Take 1 g by mouth daily.      . pantoprazole (PROTONIX) 40 MG tablet Take 40 mg by mouth 2 (two) times daily as needed (gerd).       . polyethylene glycol (MIRALAX / GLYCOLAX) packet Take 17 g by mouth daily as needed (for constipation).       . raloxifene (EVISTA) 60 MG tablet Take 60 mg by mouth daily.      [name] spironolactone (ALDACTONE) 25 MG tablet Take 25 mg by mouth daily.       No current facility-administered medications for this encounter.    Filed Vitals:   07/10/13 1337  BP: 103/69  Pulse: 68  Resp: 18  Weight: 139 lb 2 oz (63.107 kg)  SpO2: 98%    PHYSICAL EXAM: General:  Fatigued appearing. No resp difficulty; husband present HEENT: normal Neck: supple. JVP flat. Carotids 2+ bilaterally; no bruits. No  lymphadenopathy or thryomegaly appreciated. Cor: PMI normal. Regular rate & rhythm. No rubs, gallops or murmurs. Lungs: clear Abdomen: soft, nontender, nondistended. No hepatosplenomegaly. No bruits or masses. Good bowel sounds. Extremities: no cyanosis, clubbing, rash, edema Neuro: alert & orientedx3, cranial nerves grossly intact. Moves all 4 extremities w/o difficulty. Affect pleasant.   ASSESSMENT & PLAN:  1) Chronic systolic HF: ICM, EF 42-35% (03/2013) - Admitted  over the weekend for visual disturbances and CP. LHC showed stable 2V CAD with patent stents. - NYHA II-III symptoms and remains very fatigued. Volume status stable. Will continue lasix 40 mg daily and spiro 25 mg daily.  - Continue current dose of BB and ARB - Had ICD, however it was extracted in 11/2012 d/t infection. Her EF remains less than 35%. Scheduled for CRT-D on May 14th. Discussed 2-3 months after CRT-D proceeding with CPX if symptoms are not improving to perform CPX to assess functional aerobic capacity.  - Cr from in the hospital was stable, 1.17. - Reinforced the need and importance of daily weights, a low sodium diet, and fluid restriction (less than 2 L a day). Instructed to call the HF clinic if weight increases more than 3 lbs overnight or 5 lbs in a week.  2) Atrial Tachycardia - On amiodarone and low dose BB. She reports she still has some palpitations. Seeing EP at [facility] who would like to proceed with ablation 3 months after CRT-D implant. Last TSH and LFTs nl. She is aware to get yearly eye exams on amiodarone. 3) CAD - No s/s of ischemia. Continue medical management 4) CKD stage III - Baseline Cr 1.2. Continue to follow.   F/U 6 weeks Rande Brunt NP-C 1:48 PM

## 2013-07-11 DIAGNOSIS — I5023 Acute on chronic systolic (congestive) heart failure: Secondary | ICD-10-CM

## 2013-07-11 DIAGNOSIS — Z7901 Long term (current) use of anticoagulants: Secondary | ICD-10-CM

## 2013-07-11 DIAGNOSIS — I10 Essential (primary) hypertension: Secondary | ICD-10-CM

## 2013-07-11 DIAGNOSIS — R262 Difficulty in walking, not elsewhere classified: Secondary | ICD-10-CM

## 2013-07-11 DIAGNOSIS — M4800 Spinal stenosis, site unspecified: Secondary | ICD-10-CM

## 2013-07-11 DIAGNOSIS — I4891 Unspecified atrial fibrillation: Secondary | ICD-10-CM

## 2013-07-12 ENCOUNTER — Other Ambulatory Visit (INDEPENDENT_AMBULATORY_CARE_PROVIDER_SITE_OTHER): Payer: Medicare Other

## 2013-07-12 ENCOUNTER — Encounter: Payer: Self-pay | Admitting: Internal Medicine

## 2013-07-12 ENCOUNTER — Ambulatory Visit (INDEPENDENT_AMBULATORY_CARE_PROVIDER_SITE_OTHER): Payer: Medicare Other | Admitting: Internal Medicine

## 2013-07-12 VITALS — BP 118/50 | HR 65 | Temp 97.6°F | Resp 16 | Ht 64.0 in | Wt 141.5 lb

## 2013-07-12 DIAGNOSIS — I2510 Atherosclerotic heart disease of native coronary artery without angina pectoris: Secondary | ICD-10-CM

## 2013-07-12 DIAGNOSIS — R10817 Generalized abdominal tenderness: Secondary | ICD-10-CM

## 2013-07-12 DIAGNOSIS — K219 Gastro-esophageal reflux disease without esophagitis: Secondary | ICD-10-CM

## 2013-07-12 LAB — URINALYSIS, ROUTINE W REFLEX MICROSCOPIC
Bilirubin Urine: NEGATIVE
HGB URINE DIPSTICK: NEGATIVE
KETONES UR: NEGATIVE
Leukocytes, UA: NEGATIVE
Nitrite: NEGATIVE
SPECIFIC GRAVITY, URINE: 1.015 (ref 1.000–1.030)
Total Protein, Urine: NEGATIVE
URINE GLUCOSE: NEGATIVE
UROBILINOGEN UA: 0.2 (ref 0.0–1.0)
pH: 6.5 (ref 5.0–8.0)

## 2013-07-12 LAB — CBC WITH DIFFERENTIAL/PLATELET
BASOS ABS: 0 10*3/uL (ref 0.0–0.1)
Basophils Relative: 0.4 % (ref 0.0–3.0)
EOS ABS: 0 10*3/uL (ref 0.0–0.7)
Eosinophils Relative: 1.1 % (ref 0.0–5.0)
HEMATOCRIT: 43.8 % (ref 36.0–46.0)
Hemoglobin: 14.4 g/dL (ref 12.0–15.0)
LYMPHS ABS: 1.2 10*3/uL (ref 0.7–4.0)
Lymphocytes Relative: 31.9 % (ref 12.0–46.0)
MCHC: 32.9 g/dL (ref 30.0–36.0)
MCV: 104.3 fl — ABNORMAL HIGH (ref 78.0–100.0)
Monocytes Absolute: 0.8 10*3/uL (ref 0.1–1.0)
Monocytes Relative: 23.3 % — ABNORMAL HIGH (ref 3.0–12.0)
Neutro Abs: 1.6 10*3/uL (ref 1.4–7.7)
Neutrophils Relative %: 43.3 % (ref 43.0–77.0)
Platelets: 134 10*3/uL — ABNORMAL LOW (ref 150.0–400.0)
RBC: 4.2 Mil/uL (ref 3.87–5.11)
RDW: 14.8 % — AB (ref 11.5–14.6)
WBC: 3.6 10*3/uL — ABNORMAL LOW (ref 4.5–10.5)

## 2013-07-12 MED ORDER — PANTOPRAZOLE SODIUM 40 MG PO TBEC: 40.0000 mg | DELAYED_RELEASE_TABLET | Freq: Two times a day (BID) | ORAL | Status: AC

## 2013-07-12 NOTE — Patient Instructions (Signed)
Abdominal Pain, Adult °Many things can cause abdominal pain. Usually, abdominal pain is not caused by a disease and will improve without treatment. It can often be observed and treated at home. Your health care provider will do a physical exam and possibly order blood tests and X-rays to help determine the seriousness of your pain. However, in many cases, more time must pass before a clear cause of the pain can be found. Before that point, your health care provider may not know if you need more testing or further treatment. °HOME CARE INSTRUCTIONS  °Monitor your abdominal pain for any changes. The following actions may help to alleviate any discomfort you are experiencing: °· Only take over-the-counter or prescription medicines as directed by your health care provider. °· Do not take laxatives unless directed to do so by your health care provider. °· Try a clear liquid diet (broth, tea, or water) as directed by your health care provider. Slowly move to a bland diet as tolerated. °SEEK MEDICAL CARE IF: °· You have unexplained abdominal pain. °· You have abdominal pain associated with nausea or diarrhea. °· You have pain when you urinate or have a bowel movement. °· You experience abdominal pain that wakes you in the night. °· You have abdominal pain that is worsened or improved by eating food. °· You have abdominal pain that is worsened with eating fatty foods. °SEEK IMMEDIATE MEDICAL CARE IF:  °· Your pain does not go away within 2 hours. °· You have a fever. °· You keep throwing up (vomiting). °· Your pain is felt only in portions of the abdomen, such as the right side or the left lower portion of the abdomen. °· You pass bloody or black tarry stools. °MAKE SURE YOU: °· Understand these instructions.   °· Will watch your condition.   °· Will get help right away if you are not doing well or get worse.   °Document Released: 12/15/2004 Document Revised: 12/26/2012 Document Reviewed: 11/14/2012 °[employer]® Patient  Information ©2014 [employer]. ° °

## 2013-07-12 NOTE — Progress Notes (Signed)
Pre visit review using our clinic review tool, if applicable. No additional management support is needed unless otherwise documented below in the visit note. 

## 2013-07-12 NOTE — Progress Notes (Signed)
Subjective:    Patient ID: [name], female    DOB: [birth year], 63 y.o.   MRN: [id number]  Gastrophageal Reflux She complains of abdominal pain, belching, early satiety, heartburn and nausea. She reports no chest pain, no choking, no coughing, no dysphagia, no globus sensation, no hoarse voice, no sore throat, no stridor, no tooth decay, no water brash or no wheezing. This is a chronic problem. The current episode started more than 1 year ago. The problem has been gradually worsening. The heartburn does not wake her from sleep. The heartburn does not limit her activity. The heartburn doesn't change with position. Nothing aggravates the symptoms. Associated symptoms include fatigue and weight loss. Pertinent negatives include no anemia, melena, muscle weakness or orthopnea. Treatments tried: she has not been taking the PPI very often. The treatment provided mild relief. Past procedures include an EGD. Dr, Michail Sermon is her GI doc.      Review of Systems  Constitutional: Positive for weight loss, fatigue and unexpected weight change. Negative for fever, chills, diaphoresis, activity change and appetite change.  HENT: Negative.  Negative for hoarse voice, sore throat, trouble swallowing and voice change.   Eyes: Negative.   Respiratory: Negative.  Negative for cough, choking and wheezing.   Cardiovascular: Negative.  Negative for chest pain, palpitations and leg swelling.  Gastrointestinal: Positive for heartburn, nausea and abdominal pain. Negative for dysphagia, vomiting, diarrhea, constipation, blood in stool, melena, abdominal distention, anal bleeding and rectal pain.  Endocrine: Negative.   Genitourinary: Negative.   Musculoskeletal: Negative.  Negative for muscle weakness.  Skin: Negative.   Allergic/Immunologic: Negative.   Neurological: Negative.   Hematological: Negative.  Negative for adenopathy. Does not bruise/bleed easily.  Psychiatric/Behavioral: Negative.          Objective:   Physical Exam  Vitals reviewed. Constitutional: She is oriented to person, place, and time. She appears well-developed and well-nourished.  Non-toxic appearance. She does not have a sickly appearance. She does not appear ill. No distress.  HENT:  Head: Normocephalic and atraumatic.  Mouth/Throat: Oropharynx is clear and moist. No oropharyngeal exudate.  Eyes: Conjunctivae are normal. Right eye exhibits no discharge. Left eye exhibits no discharge. No scleral icterus.  Neck: Normal range of motion. Neck supple. No JVD present. No tracheal deviation present. No thyromegaly present.  Cardiovascular: Normal rate, regular rhythm, normal heart sounds and intact distal pulses.  Exam reveals no gallop and no friction rub.   No murmur heard. Pulmonary/Chest: Effort normal and breath sounds normal. No stridor. No respiratory distress. She has no wheezes. She has no rales. She exhibits no tenderness.  Abdominal: Soft. Bowel sounds are normal. She exhibits no shifting dullness, no distension, no pulsatile liver, no fluid wave, no abdominal bruit, no ascites, no pulsatile midline mass and no mass. There is no hepatosplenomegaly, splenomegaly or hepatomegaly. There is tenderness in the right lower quadrant and left lower quadrant. There is no rigidity, no rebound, no guarding, no CVA tenderness and negative Murphy's sign. No hernia. Hernia confirmed negative in the ventral area, confirmed negative in the right inguinal area and confirmed negative in the left inguinal area.  Musculoskeletal: Normal range of motion. She exhibits no edema and no tenderness.  Lymphadenopathy:    She has no cervical adenopathy.  Neurological: She is oriented to person, place, and time.  Skin: Skin is warm and dry. No rash noted. She is not diaphoretic. No erythema. No pallor.     Lab Results  Component Value Date  WBC 3.2* 07/07/2013   HGB 13.2 07/07/2013   HCT 38.2 07/07/2013   PLT 120* 07/07/2013   GLUCOSE 94  07/07/2013   CHOL 226* 04/22/2013   TRIG 68 04/22/2013   HDL 119 04/22/2013   LDLDIRECT 126.3 12/15/2011   LDLCALC 93 04/22/2013   ALT 23 06/18/2013   AST 51* 06/18/2013   NA 135* 07/07/2013   K 3.7 07/07/2013   CL 101 07/07/2013   CREATININE 1.17* 07/07/2013   BUN 17 07/07/2013   CO2 19 07/07/2013   TSH 1.930 06/18/2013   INR 1.08 07/06/2013   HGBA1C 5.7* 06/06/2012       Assessment & Plan:

## 2013-07-13 ENCOUNTER — Encounter: Payer: Self-pay | Admitting: Internal Medicine

## 2013-07-13 NOTE — Assessment & Plan Note (Signed)
I am concerned that she may have some GI pathology so I have ordered labs today I have also asked her to see Dr. Michail Sermon to consider endoscopy I will not order a CT in light of her renal function but will see if an U/S picks up on any organic pathology

## 2013-07-13 NOTE — Assessment & Plan Note (Signed)
I have asked her to restart the PPI BID I will check her labs today to look for anemia/dehydration I have asked her to see Dr. Michail Sermon to consider upper endoscopy

## 2013-07-15 ENCOUNTER — Telehealth: Payer: Self-pay | Admitting: *Deleted

## 2013-07-15 LAB — COMPREHENSIVE METABOLIC PANEL
ALK PHOS: 43 U/L (ref 39–117)
ALT: 33 U/L (ref 0–35)
AST: 68 U/L — ABNORMAL HIGH (ref 0–37)
Albumin: 4 g/dL (ref 3.5–5.2)
BILIRUBIN TOTAL: 0.7 mg/dL (ref 0.3–1.2)
BUN: 27 mg/dL — ABNORMAL HIGH (ref 6–23)
CO2: 24 mEq/L (ref 19–32)
Calcium: 9.2 mg/dL (ref 8.4–10.5)
Chloride: 103 mEq/L (ref 96–112)
Creatinine, Ser: 1.4 mg/dL — ABNORMAL HIGH (ref 0.4–1.2)
GFR: 49.14 mL/min — ABNORMAL LOW (ref >60.00)
Glucose, Bld: 134 mg/dL — ABNORMAL HIGH (ref 70–99)
Potassium: 4.5 mEq/L (ref 3.5–5.1)
Sodium: 140 mEq/L (ref 135–145)
Total Protein: 7.5 g/dL (ref 6.0–8.3)

## 2013-07-15 LAB — AMYLASE: Amylase: 64 U/L (ref 27–131)

## 2013-07-15 LAB — LIPASE: LIPASE: 20 U/L (ref 11.0–59.0)

## 2013-07-15 NOTE — Telephone Encounter (Signed)
The labs looked good

## 2013-07-15 NOTE — Telephone Encounter (Signed)
Spoke with pt advised of MDs results.

## 2013-07-15 NOTE — Telephone Encounter (Signed)
Pt called requesting lab results. Please advise.  

## 2013-07-16 ENCOUNTER — Ambulatory Visit
Admission: RE | Admit: 2013-07-16 | Discharge: 2013-07-16 | Disposition: A | Discharge status: Home / Self Care | Payer: Medicare Other | Source: Ambulatory Visit | Attending: Internal Medicine | Admitting: Internal Medicine

## 2013-07-16 ENCOUNTER — Encounter: Payer: Self-pay | Admitting: Internal Medicine

## 2013-07-16 DIAGNOSIS — R10817 Generalized abdominal tenderness: Secondary | ICD-10-CM

## 2013-07-17 ENCOUNTER — Telehealth: Payer: Self-pay

## 2013-07-17 NOTE — Telephone Encounter (Signed)
Dr. Gery Pray a skilled nursing/home health order two times a week until pt has surgery. Caryl Asp has been informed of the verbal order.

## 2013-07-17 NOTE — Telephone Encounter (Signed)
Ok with me 

## 2013-07-17 NOTE — Telephone Encounter (Signed)
Joy w/ [facility] called [facility] requesting verbal orders for skilled nursing/home health order two times a week until pt has surgery. Please advise if ok. Thanks

## 2013-07-18 ENCOUNTER — Encounter (HOSPITAL_COMMUNITY): Payer: Self-pay | Admitting: Pharmacy Technician

## 2013-07-19 ENCOUNTER — Telehealth: Payer: Self-pay | Admitting: *Deleted

## 2013-07-19 NOTE — Telephone Encounter (Signed)
New message  ° ° °Patient calling back to speak with nurse  °

## 2013-07-19 NOTE — Telephone Encounter (Signed)
Left message explaining we are needing to reschedule CRT-D procedure (she was originally scheduled for 08/01/13).  (would like to reschedule to 5/18 if pt is willing)

## 2013-07-19 NOTE — Telephone Encounter (Signed)
Rescheduled right-sided CRT-D to 08/05/13 at 12:00, be at hospital at 10:00am  Patient verbalized understanding and agreeable to plan.

## 2013-07-25 ENCOUNTER — Other Ambulatory Visit (INDEPENDENT_AMBULATORY_CARE_PROVIDER_SITE_OTHER): Payer: Medicare Other

## 2013-07-25 DIAGNOSIS — I2589 Other forms of chronic ischemic heart disease: Secondary | ICD-10-CM

## 2013-07-25 DIAGNOSIS — I10 Essential (primary) hypertension: Secondary | ICD-10-CM

## 2013-07-25 DIAGNOSIS — Z01812 Encounter for preprocedural laboratory examination: Secondary | ICD-10-CM

## 2013-07-25 DIAGNOSIS — I255 Ischemic cardiomyopathy: Secondary | ICD-10-CM

## 2013-07-25 LAB — BASIC METABOLIC PANEL
BUN: 15 mg/dL (ref 6–23)
CO2: 27 mEq/L (ref 19–32)
Calcium: 9.1 mg/dL (ref 8.4–10.5)
Chloride: 104 mEq/L (ref 96–112)
Creatinine, Ser: 1.3 mg/dL — ABNORMAL HIGH (ref 0.4–1.2)
GFR: 53.08 mL/min — AB (ref >60.00)
Glucose, Bld: 102 mg/dL — ABNORMAL HIGH (ref 70–99)
POTASSIUM: 4.5 meq/L (ref 3.5–5.1)
SODIUM: 139 meq/L (ref 135–145)

## 2013-07-25 LAB — CBC WITH DIFFERENTIAL/PLATELET
BASOS PCT: 0.4 % (ref 0.0–3.0)
Basophils Absolute: 0 10*3/uL (ref 0.0–0.1)
EOS PCT: 1.4 % (ref 0.0–5.0)
Eosinophils Absolute: 0.1 10*3/uL (ref 0.0–0.7)
HEMATOCRIT: 42.4 % (ref 36.0–46.0)
HEMOGLOBIN: 14 g/dL (ref 12.0–15.0)
LYMPHS ABS: 1.5 10*3/uL (ref 0.7–4.0)
Lymphocytes Relative: 32.8 % (ref 12.0–46.0)
MCHC: 33.1 g/dL (ref 30.0–36.0)
MCV: 104.3 fl — ABNORMAL HIGH (ref 78.0–100.0)
MONO ABS: 0.8 10*3/uL (ref 0.1–1.0)
NEUTROS ABS: 2.2 10*3/uL (ref 1.4–7.7)
Neutrophils Relative %: 47 % (ref 43.0–77.0)
Platelets: 155 10*3/uL (ref 150.0–400.0)
RBC: 4.06 Mil/uL (ref 3.87–5.11)
RDW: 15.9 % — ABNORMAL HIGH (ref 11.5–15.5)
WBC: 4.6 10*3/uL (ref 4.0–10.5)

## 2013-08-01 ENCOUNTER — Telehealth: Payer: Self-pay | Admitting: Internal Medicine

## 2013-08-01 NOTE — Telephone Encounter (Signed)
New message     Patient heart rate elevated today  140 beat per minute.    patient taken digoxin, losartan, amiodarone this am. Should any medication be change.

## 2013-08-01 NOTE — Telephone Encounter (Signed)
Spoke to patient. She states that today when the PT, Junie Panning, was there is when her heart rate was 140. States it lasted 20-30 minutes and then started to go down. Currently at 14. Denies CP, SOB or other cardiac symptoms. She states she is mostly just nervous and anxious over he upcoming surgery for defibrillator on Monday. Advised that should she experience this again to call office or she should proceed to Emergency Dept to be assessed/treated. Patient verbalized understanding and agreement. Discussed relaxation techniques, including deep breathing exercises when she feels anxious.  Patient voiced understanding and appreciation for the phone call and the information.

## 2013-08-01 NOTE — Telephone Encounter (Signed)
LMTCB @1 :23 pm

## 2013-08-01 NOTE — Telephone Encounter (Signed)
LMTCB @5 :05 pm

## 2013-08-04 MED ORDER — CHLORHEXIDINE GLUCONATE 4 % EX LIQD
60.0000 mL | Freq: Once | CUTANEOUS | Status: DC
  Filled 2013-08-04: qty 60

## 2013-08-04 MED ORDER — VANCOMYCIN HCL IN DEXTROSE 1-5 GM/200ML-% IV SOLN
1000.0000 mg | INTRAVENOUS | Status: DC
  Filled 2013-08-04: qty 200

## 2013-08-04 MED ORDER — SODIUM CHLORIDE 0.9 % IR SOLN
80.0000 mg | Status: DC
  Filled 2013-08-04: qty 2

## 2013-08-05 ENCOUNTER — Encounter (HOSPITAL_COMMUNITY): Payer: Self-pay | Admitting: General Practice

## 2013-08-05 ENCOUNTER — Ambulatory Visit (HOSPITAL_COMMUNITY)
Admission: RE | Admit: 2013-08-05 | Discharge: 2013-08-07 | Disposition: A | Discharge status: Home / Self Care | Payer: Medicare Other | Source: Ambulatory Visit | Attending: Internal Medicine | Admitting: Internal Medicine

## 2013-08-05 ENCOUNTER — Encounter (HOSPITAL_COMMUNITY)
Admission: RE | Disposition: A | Discharge status: Home / Self Care | Payer: Self-pay | Source: Ambulatory Visit | Attending: Internal Medicine

## 2013-08-05 DIAGNOSIS — D7589 Other specified diseases of blood and blood-forming organs: Secondary | ICD-10-CM | POA: Insufficient documentation

## 2013-08-05 DIAGNOSIS — E785 Hyperlipidemia, unspecified: Secondary | ICD-10-CM | POA: Insufficient documentation

## 2013-08-05 DIAGNOSIS — I059 Rheumatic mitral valve disease, unspecified: Secondary | ICD-10-CM | POA: Insufficient documentation

## 2013-08-05 DIAGNOSIS — Z79899 Other long term (current) drug therapy: Secondary | ICD-10-CM | POA: Insufficient documentation

## 2013-08-05 DIAGNOSIS — I447 Left bundle-branch block, unspecified: Secondary | ICD-10-CM | POA: Insufficient documentation

## 2013-08-05 DIAGNOSIS — Z87891 Personal history of nicotine dependence: Secondary | ICD-10-CM | POA: Insufficient documentation

## 2013-08-05 DIAGNOSIS — G8929 Other chronic pain: Secondary | ICD-10-CM | POA: Insufficient documentation

## 2013-08-05 DIAGNOSIS — I252 Old myocardial infarction: Secondary | ICD-10-CM | POA: Insufficient documentation

## 2013-08-05 DIAGNOSIS — I2589 Other forms of chronic ischemic heart disease: Secondary | ICD-10-CM | POA: Insufficient documentation

## 2013-08-05 DIAGNOSIS — M545 Low back pain, unspecified: Secondary | ICD-10-CM | POA: Insufficient documentation

## 2013-08-05 DIAGNOSIS — I4891 Unspecified atrial fibrillation: Secondary | ICD-10-CM | POA: Insufficient documentation

## 2013-08-05 DIAGNOSIS — I509 Heart failure, unspecified: Secondary | ICD-10-CM | POA: Insufficient documentation

## 2013-08-05 DIAGNOSIS — I5042 Chronic combined systolic (congestive) and diastolic (congestive) heart failure: Secondary | ICD-10-CM

## 2013-08-05 DIAGNOSIS — K559 Vascular disorder of intestine, unspecified: Secondary | ICD-10-CM | POA: Insufficient documentation

## 2013-08-05 DIAGNOSIS — I480 Paroxysmal atrial fibrillation: Secondary | ICD-10-CM

## 2013-08-05 DIAGNOSIS — Z96659 Presence of unspecified artificial knee joint: Secondary | ICD-10-CM | POA: Insufficient documentation

## 2013-08-05 DIAGNOSIS — I639 Cerebral infarction, unspecified: Secondary | ICD-10-CM

## 2013-08-05 DIAGNOSIS — Z7901 Long term (current) use of anticoagulants: Secondary | ICD-10-CM | POA: Insufficient documentation

## 2013-08-05 DIAGNOSIS — N183 Chronic kidney disease, stage 3 unspecified: Secondary | ICD-10-CM | POA: Insufficient documentation

## 2013-08-05 DIAGNOSIS — M4800 Spinal stenosis, site unspecified: Secondary | ICD-10-CM | POA: Insufficient documentation

## 2013-08-05 DIAGNOSIS — I255 Ischemic cardiomyopathy: Secondary | ICD-10-CM

## 2013-08-05 DIAGNOSIS — Z95810 Presence of automatic (implantable) cardiac defibrillator: Secondary | ICD-10-CM

## 2013-08-05 DIAGNOSIS — I129 Hypertensive chronic kidney disease with stage 1 through stage 4 chronic kidney disease, or unspecified chronic kidney disease: Secondary | ICD-10-CM | POA: Insufficient documentation

## 2013-08-05 DIAGNOSIS — I959 Hypotension, unspecified: Secondary | ICD-10-CM | POA: Insufficient documentation

## 2013-08-05 DIAGNOSIS — I471 Supraventricular tachycardia: Secondary | ICD-10-CM

## 2013-08-05 DIAGNOSIS — Z9861 Coronary angioplasty status: Secondary | ICD-10-CM | POA: Insufficient documentation

## 2013-08-05 DIAGNOSIS — I5022 Chronic systolic (congestive) heart failure: Secondary | ICD-10-CM | POA: Insufficient documentation

## 2013-08-05 DIAGNOSIS — M1990 Unspecified osteoarthritis, unspecified site: Secondary | ICD-10-CM | POA: Insufficient documentation

## 2013-08-05 DIAGNOSIS — C001 Malignant neoplasm of external lower lip: Secondary | ICD-10-CM | POA: Insufficient documentation

## 2013-08-05 DIAGNOSIS — E039 Hypothyroidism, unspecified: Secondary | ICD-10-CM | POA: Insufficient documentation

## 2013-08-05 DIAGNOSIS — Z9049 Acquired absence of other specified parts of digestive tract: Secondary | ICD-10-CM | POA: Insufficient documentation

## 2013-08-05 DIAGNOSIS — R000 Tachycardia, unspecified: Secondary | ICD-10-CM | POA: Insufficient documentation

## 2013-08-05 DIAGNOSIS — Z8673 Personal history of transient ischemic attack (TIA), and cerebral infarction without residual deficits: Secondary | ICD-10-CM | POA: Insufficient documentation

## 2013-08-05 DIAGNOSIS — Z01812 Encounter for preprocedural laboratory examination: Secondary | ICD-10-CM

## 2013-08-05 DIAGNOSIS — K219 Gastro-esophageal reflux disease without esophagitis: Secondary | ICD-10-CM | POA: Insufficient documentation

## 2013-08-05 HISTORY — PX: BI-VENTRICULAR IMPLANTABLE CARDIOVERTER DEFIBRILLATOR: SHX5459

## 2013-08-05 HISTORY — PX: BI-VENTRICULAR IMPLANTABLE CARDIOVERTER DEFIBRILLATOR  (CRT-D): SHX5747

## 2013-08-05 HISTORY — DX: Presence of automatic (implantable) cardiac defibrillator: Z95.810

## 2013-08-05 LAB — SURGICAL PCR SCREEN
MRSA, PCR: NEGATIVE
STAPHYLOCOCCUS AUREUS: NEGATIVE

## 2013-08-05 SURGERY — BI-VENTRICULAR IMPLANTABLE CARDIOVERTER DEFIBRILLATOR  (CRT-D)
Anesthesia: LOCAL

## 2013-08-05 MED ORDER — ACETAMINOPHEN 325 MG PO TABS
325.0000 mg | ORAL_TABLET | ORAL | Status: DC | PRN
  Administered 2013-08-05 (×2): 650 mg via ORAL
  Filled 2013-08-05 (×2): qty 2

## 2013-08-05 MED ORDER — APIXABAN 5 MG PO TABS
5.0000 mg | ORAL_TABLET | Freq: Two times a day (BID) | ORAL | Status: DC
  Administered 2013-08-05 – 2013-08-07 (×4): 5 mg via ORAL
  Filled 2013-08-05 (×5): qty 1

## 2013-08-05 MED ORDER — MIDAZOLAM HCL 5 MG/5ML IJ SOLN
INTRAMUSCULAR | Status: AC
  Filled 2013-08-05: qty 5

## 2013-08-05 MED ORDER — HYOSCYAMINE SULFATE ER 0.375 MG PO TB12: 0.3750 mg | ORAL_TABLET | Freq: Two times a day (BID) | ORAL | Status: DC | PRN

## 2013-08-05 MED ORDER — RALOXIFENE HCL 60 MG PO TABS
60.0000 mg | ORAL_TABLET | Freq: Every day | ORAL | Status: DC
  Administered 2013-08-05 – 2013-08-07 (×3): 60 mg via ORAL
  Filled 2013-08-05 (×3): qty 1

## 2013-08-05 MED ORDER — CARVEDILOL 3.125 MG PO TABS
3.1250 mg | ORAL_TABLET | Freq: Two times a day (BID) | ORAL | Status: DC
  Administered 2013-08-05 – 2013-08-07 (×4): 3.125 mg via ORAL
  Filled 2013-08-05 (×6): qty 1

## 2013-08-05 MED ORDER — FENTANYL CITRATE 0.05 MG/ML IJ SOLN
INTRAMUSCULAR | Status: AC
  Filled 2013-08-05: qty 2

## 2013-08-05 MED ORDER — FLUTICASONE PROPIONATE 50 MCG/ACT NA SUSP: 2.0000 | Freq: Every day | NASAL | Status: DC | PRN

## 2013-08-05 MED ORDER — AMIODARONE HCL 200 MG PO TABS
200.0000 mg | ORAL_TABLET | Freq: Every day | ORAL | Status: DC
  Administered 2013-08-05 – 2013-08-06 (×2): 200 mg via ORAL
  Filled 2013-08-05 (×3): qty 1

## 2013-08-05 MED ORDER — SODIUM CHLORIDE 0.9 % IV SOLN: INTRAVENOUS | Status: DC

## 2013-08-05 MED ORDER — DIGOXIN 0.0625 MG HALF TABLET
0.0625 mg | ORAL_TABLET | Freq: Every day | ORAL | Status: DC
  Administered 2013-08-05 – 2013-08-07 (×3): 0.0625 mg via ORAL
  Filled 2013-08-05 (×3): qty 1

## 2013-08-05 MED ORDER — SPIRONOLACTONE 25 MG PO TABS
25.0000 mg | ORAL_TABLET | Freq: Every day | ORAL | Status: DC
  Administered 2013-08-05: 25 mg via ORAL
  Filled 2013-08-05 (×2): qty 1

## 2013-08-05 MED ORDER — EZETIMIBE 10 MG PO TABS
10.0000 mg | ORAL_TABLET | Freq: Every day | ORAL | Status: DC
  Administered 2013-08-05 – 2013-08-06 (×2): 10 mg via ORAL
  Filled 2013-08-05 (×3): qty 1

## 2013-08-05 MED ORDER — MUPIROCIN 2 % EX OINT
TOPICAL_OINTMENT | CUTANEOUS | Status: AC
  Filled 2013-08-05: qty 22

## 2013-08-05 MED ORDER — POLYETHYLENE GLYCOL 3350 17 G PO PACK
17.0000 g | PACK | Freq: Every day | ORAL | Status: DC | PRN
  Filled 2013-08-05: qty 1

## 2013-08-05 MED ORDER — PANTOPRAZOLE SODIUM 40 MG PO TBEC
40.0000 mg | DELAYED_RELEASE_TABLET | Freq: Two times a day (BID) | ORAL | Status: DC
  Administered 2013-08-05 – 2013-08-07 (×4): 40 mg via ORAL
  Filled 2013-08-05 (×4): qty 1

## 2013-08-05 MED ORDER — SODIUM CHLORIDE 0.9 % IV SOLN: INTRAVENOUS | Status: AC

## 2013-08-05 MED ORDER — LOSARTAN POTASSIUM 25 MG PO TABS
12.5000 mg | ORAL_TABLET | Freq: Every day | ORAL | Status: DC
  Administered 2013-08-05 – 2013-08-07 (×2): 12.5 mg via ORAL
  Filled 2013-08-05 (×3): qty 0.5

## 2013-08-05 MED ORDER — OMEGA-3-ACID ETHYL ESTERS 1 G PO CAPS
1.0000 g | ORAL_CAPSULE | Freq: Every day | ORAL | Status: DC
  Administered 2013-08-05 – 2013-08-07 (×3): 1 g via ORAL
  Filled 2013-08-05 (×3): qty 1

## 2013-08-05 MED ORDER — LIDOCAINE HCL (PF) 1 % IJ SOLN
INTRAMUSCULAR | Status: AC
  Filled 2013-08-05: qty 60

## 2013-08-05 MED ORDER — NITROGLYCERIN 0.4 MG SL SUBL: 0.4000 mg | SUBLINGUAL_TABLET | SUBLINGUAL | Status: DC | PRN

## 2013-08-05 MED ORDER — FA-PYRIDOXINE-CYANOCOBALAMIN 2.5-25-2 MG PO TABS
1.0000 | ORAL_TABLET | Freq: Every day | ORAL | Status: DC
  Administered 2013-08-05 – 2013-08-07 (×3): 1 via ORAL
  Filled 2013-08-05 (×3): qty 1

## 2013-08-05 MED ORDER — VANCOMYCIN HCL IN DEXTROSE 1-5 GM/200ML-% IV SOLN
1000.0000 mg | Freq: Two times a day (BID) | INTRAVENOUS | Status: AC
  Administered 2013-08-05: 1000 mg via INTRAVENOUS
  Filled 2013-08-05: qty 200

## 2013-08-05 MED ORDER — SODIUM CHLORIDE 0.9 % IV SOLN: Freq: Once | INTRAVENOUS | Status: DC

## 2013-08-05 MED ORDER — LEVOTHYROXINE SODIUM 200 MCG PO TABS
200.0000 ug | ORAL_TABLET | Freq: Every day | ORAL | Status: DC
  Administered 2013-08-06 – 2013-08-07 (×2): 200 ug via ORAL
  Filled 2013-08-05 (×3): qty 1

## 2013-08-05 MED ORDER — ONDANSETRON HCL 4 MG/2ML IJ SOLN
4.0000 mg | Freq: Four times a day (QID) | INTRAMUSCULAR | Status: DC | PRN
  Administered 2013-08-06 – 2013-08-07 (×2): 4 mg via INTRAVENOUS
  Filled 2013-08-05 (×2): qty 2

## 2013-08-05 MED ORDER — SODIUM CHLORIDE 0.9 % IJ SOLN
3.0000 mL | Freq: Two times a day (BID) | INTRAMUSCULAR | Status: DC
  Administered 2013-08-05 – 2013-08-07 (×2): 3 mL via INTRAVENOUS

## 2013-08-05 MED ORDER — MUPIROCIN 2 % EX OINT
TOPICAL_OINTMENT | Freq: Two times a day (BID) | CUTANEOUS | Status: DC
  Filled 2013-08-05: qty 22

## 2013-08-05 MED ORDER — FUROSEMIDE 40 MG PO TABS
40.0000 mg | ORAL_TABLET | Freq: Every day | ORAL | Status: DC
  Administered 2013-08-05 – 2013-08-07 (×3): 40 mg via ORAL
  Filled 2013-08-05 (×3): qty 1

## 2013-08-05 NOTE — Discharge Summary (Addendum)
ELECTROPHYSIOLOGY PROCEDURE DISCHARGE SUMMARY    Patient ID: [name],  MRN: [id number], DOB/AGE: [birth year] 63 y.o.  Admit date: 08/05/2013 Discharge date: 08/07/2013  Primary Care Physician: [name], MD Primary Cardiologist: [place] Electrophysiologist: Caryl Comes  Hypotension through  The night  Echo this am wihtout effusion  EF 15-20% ( maybe )   With discomfort at site, none in chest   No LH BP at home 90-100  Primary Discharge Diagnosis:  Ischemic cardiomyopathy, CHF, LBBB status post CRTD implantation this admission  Secondary Discharge Diagnosis:  1.  Spinal stenosis 2.  CAD - a. Ant MI 8/03 with stenting of the LAD; b. staged PCI with Cypher DES to OM1 8/03; c. s/p Cypher DES to LAD 7/04; d. multiple cardiac caths in past (chronically abnl ECG); e. cardiac catheterization 3/12: LAD stent patent, distal LAD 40-50%, small ostial D1 80%, ostial D2 60%, OM-1 stent patent (20-30%), proximal RCA 50%, proximal to mid RCA 40-50%; f. cath 02/17/2011 - nonobs  3.  CKD 4.  GERD 5.  Hypertension 6.  Prior device infection and subsequent extraction 7.  Atrial fibrillation   Allergies  Allergen Reactions  . Coconut Oil Anaphylaxis and Hives  . Lansoprazole Nausea Only  . Penicillins Swelling    Throat swells  . Statins Other (See Comments)    cramps  . Lisinopril     Cough- but still tolerates it  . Lactose Intolerance (Gi) Diarrhea and Other (See Comments)    Patient reports abdominal cramping and diarrhea with lactose products.      Procedures This Admission:  1.  Implantation of a STJ CRTD on 08/07/2013 by Dr Caryl Comes.  See op note for full details There were no immediate post procedure complications. 2.  CXR on 08-06-2013 demonstrated no pneumothorax status post device implantation.    Brief HPI: [name] is a 63 y.o. female with ischemic cardiomyopathy status post ICD implantation with subsequent pocket infection and device explantation in September  2014.  She has worn a LifeVest and has had recurrent admissions for heart failure since device explant.  referred to electrophysiology in the outpatient setting for consideration of ICD implantation.   Risks, benefits, and alternatives to CRTD implantation were reviewed with the patient who wished to proceed.   Hospital Course:  The patient was admitted and underwent implantation of a STJ CRTD with details as outlined above.   She was monitored on telemetry overnight which demonstrated sinus rhythm with ventricular pacing.  Left chest was without hematoma or ecchymosis.  The device was interrogated and found to be functioning normally.  CXR was obtained and demonstrated no pneumothorax status post device implantation.  Wound care, arm mobility, and restrictions were reviewed with the patient.  Dr Caryl Comes examined the patient and considered them stable for discharge to home.   08-07-13 - patient developed atrial tach this morning - amiodarone dose was increased and patient spontaenously converted to AV pacing - per Dr Caryl Comes, increase Amiodarone to 400mg  bid for 1 week then back to 200mg  daily - patient will see Dr Caryl Comes at wound check appt in 1 week to re-evaluate.  Otherwise stable for discharge to home per Dr Caryl Comes today.   The patient's discharge medications include an ACE-I (Losartan) and beta blocker (Carvedilol).   Discharge Vitals: Blood pressure 95/56, pulse 113, temperature 98.2 F (36.8 C), temperature source Oral, resp. rate 18, height 5\' 4"  (1.626 m), weight 143 lb 1.3 oz (64.9 kg), SpO2 96.00%.  Well developed  and nourished in no acute distress HENT normal Neck supple with JVP-flat Clear Regular rate and rhythm, no murmurs or gallops Abd-soft with active BS No Clubbing cyanosis edema Skin-warm and dry A & Oriented  Grossly normal sensory and motor function   Labs:   Lab Results  Component Value Date   WBC 4.6 07/25/2013   HGB 14.0 07/25/2013   HCT 42.4 07/25/2013   MCV 104.3* 07/25/2013    PLT 155.0 07/25/2013   No results found for this basename: NA, K, CL, CO2, BUN, CREATININE, CALCIUM, LABALBU, PROT, BILITOT, ALKPHOS, ALT, AST, GLUCOSE,  in the last 168 hours  Discharge Medications:    Medication List         amiodarone 200 MG tablet  Commonly known as:  PACERONE  Take 200 mg by mouth daily.     apixaban 5 MG Tabs tablet  Commonly known as:  ELIQUIS  Take 1 tablet (5 mg total) by mouth 2 (two) times daily.     carvedilol 6.25 MG tablet  Commonly known as:  COREG  Take 0.5 tablets (3.125 mg total) by mouth 2 (two) times daily with a meal.     digoxin 0.125 MG tablet  Commonly known as:  LANOXIN  Take 0.5 tablets (0.0625 mg total) by mouth daily.     ezetimibe 10 MG tablet  Commonly known as:  ZETIA  Take 10 mg by mouth at bedtime.     fexofenadine 180 MG tablet  Commonly known as:  ALLEGRA  Take 180 mg by mouth daily as needed for allergies.     fluticasone 50 MCG/ACT nasal spray  Commonly known as:  FLONASE  Place 2 sprays into both nostrils daily as needed for allergies.     folic acid-pyridoxine-cyancobalamin 2.5-25-2 MG Tabs  Commonly known as:  FOLTX  Take 1 tablet by mouth daily.     furosemide 40 MG tablet  Commonly known as:  LASIX  Take 1 tablet (40 mg total) by mouth daily.     LEVBID 0.375 MG 12 hr tablet  Generic drug:  hyoscyamine  Take 0.375 mg by mouth every 12 (twelve) hours as needed for cramping.     levothyroxine 200 MCG tablet  Commonly known as:  SYNTHROID, LEVOTHROID  Take 200 mcg by mouth daily before breakfast.     losartan 25 MG tablet  Commonly known as:  COZAAR  Take 0.5 tablets (12.5 mg total) by mouth daily.     nitroGLYCERIN 0.4 MG SL tablet  Commonly known as:  NITROSTAT  Place 0.4 mg under the tongue every 5 (five) minutes as needed for chest pain.     omega-3 acid ethyl esters 1 G capsule  Commonly known as:  LOVAZA  Take 1 g by mouth daily.     pantoprazole 40 MG tablet  Commonly known as:  PROTONIX    Take 1 tablet (40 mg total) by mouth 2 (two) times daily.     polyethylene glycol packet  Commonly known as:  MIRALAX / GLYCOLAX  Take 17 g by mouth daily as needed (for constipation).     raloxifene 60 MG tablet  Commonly known as:  EVISTA  Take 60 mg by mouth daily.     spironolactone 25 MG tablet  Commonly known as:  ALDACTONE  Take 25 mg by mouth daily.        Disposition:  will decrease aldactone ot 12.5  Ambulate and home later today  resume apixoban tomorrow  Duration of Discharge Encounter:  Greater than 30 minutes including physician time.  Signed,

## 2013-08-05 NOTE — CV Procedure (Signed)
[name] [id number]  [id number]  Preop QX:[id number] atrial tachycardia  Ischemic cardiomyopathy   Postop Dx same/   Procedure: CRT implant   Cx: None   Dictation number  [id number]  Deboraha Sprang, MD 08/05/2013 3:23 PM

## 2013-08-05 NOTE — Progress Notes (Signed)
BP 71/31, pt dozing quietly, awakens to light touch.  Speech clear, oriented x 4.  Rechecked manually 78/48.  Dr Bridget Hartshorn notified of BP and low EF and already received 250 cc bolus.  Tele SB 50's.  O2 sat 97% on ra.  Lungs clear.  Had afternoon procedure, only pain med since was tylenol.  No orders received except to monitor mentation throughout night, call back if needed.

## 2013-08-05 NOTE — Progress Notes (Signed)
Pt resting , Sb/p 70"s , denies any discomforts, Mickel Baas ingold notified, IV bolus 250 ml given.as ordered , tolerated well, increase in SB/P  noted. See V/S flow sheet.

## 2013-08-05 NOTE — Progress Notes (Signed)
Pt's nurse aware that I was unsuccessful with PIV.  Pt prefers not to be stuck again and have the MD be notified. [place]ina Pizza

## 2013-08-05 NOTE — Interval H&P Note (Signed)
ICD Criteria  Current LVEF:20% ;Obtained > or = 1 month ago and < or = 3 months ago.  NYHA Functional Classification: Class III  Heart Failure History:  Yes, Duration of heart failure since onset is > 9 months  Non-Ischemic Dilated Cardiomyopathy History:  No.  Atrial Fibrillation/Atrial Flutter:  Yes, A-Fib/A-Flutter type: Paroxysmal.  Ventricular Tachycardia History:  No.  Cardiac Arrest History:  No  History of Syndromes with Risk of Sudden Death:  No.  Previous ICD:  Yes, ICD Type:  Single, Reason for ICD:  Primary prevention.  LVEF is not available  Electrophysiology Study: No.  Prior MI: Yes, Most recent MI timeframe is > 40 days.  PPM: No.  OSA:  No  Patient Life Expectancy of >=1 year: Yes.  Anticoagulation Therapy:  Patient is on anticoagulation therapy, anticoagulation was held prior to procedure.   Beta Blocker Therapy:  Yes.   Ace Inhibitor/ARB Therapy:  Yes.History and Physical Interval Note:  08/05/2013 11:13 AM  [name]  has presented today for surgery, with the diagnosis of icm  The various methods of treatment have been discussed with the patient and family. After consideration of risks, benefits and other options for treatment, the patient has consented to  Procedure(s): BI-VENTRICULAR IMPLANTABLE CARDIOVERTER DEFIBRILLATOR  (CRT-D) (N/A) as a surgical intervention .  The patient's history has been reviewed, patient examined, no change in status, stable for surgery.  I have reviewed the patient's chart and labs.  Questions were answered to the patient's satisfaction.     Deboraha Sprang

## 2013-08-05 NOTE — H&P (Signed)
History and Physical  Patient ID: [name] MRN: [id number], SOB: 11-26-1950 63 y.o. Date of Encounter: 08/05/2013, 10:53 AM  Primary Physician: [name], MD Primary Cardiologist:  SK Primary Electrophysiologist:  SK  Chief Complaint:  For CRT implantation  History of Present Illness: [name] is a 63 y.o. female  Seen   for an ICD implantion for primary prevention in the setting of ischemic cardiomyopathy with prior stenting  Years ago she underwent device implantation and  she underwent device revision in 2006. She underwent repeat operation by Dr. 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   She was hospitalized 3/15 with A/C CHF attributed in part to atrial tachy which was ultimately treated with amiodarone, after AV ablation/device implantation and primary ablation were considered, but pt and MDs were in favor of amio therapy   She has since seen Dr Adrian Prows regarding Atrial Tach ablation and this would be anticipated 3 months post implant  Has had recurrent tachypalps and some anxiety    Past Medical History  Diagnosis Date  . Spinal stenosis   . Numbness of foot   . Diverticulitis     a. s/p partial colectomy 1/12 with reversal in July 2012.  . Colitis, ischemic   . DJD (degenerative joint disease)   . Ischemic cardiomyopathy     a. Echo 06/16/10: EF 25-30%, anteroseptal and apical hypokinesis, moderate AI, mild MR.  . Chronic systolic heart failure   . LV (left ventricular) mural thrombus   . CKD (chronic kidney disease), stage III   . GERD (gastroesophageal reflux disease)   . Hypertension   . Hypothyroidism   . Hyperlipidemia   . Compression fracture     a. of L2  . Inappropriate shocks from ICD (implantable  cardioverter-defibrillator)      a. 2/2 atrial fibrillation with RVR in the context of hypokalemia  . PAF (paroxysmal atrial fibrillation)     a. on Eliquis, digoxin, and amiodarone  . Anemia   . H/O hiatal hernia   . Sinus bradycardia   . Mitral regurgitation   . CHF (congestive heart failure)   . Chronic lower back pain   . CAD (coronary artery disease)     a.  Ant MI 8/03 with stenting to LAD;  b. staged PCI w/ DES to OM1 8/03;  c. s/p DES to LAD 7/04;  d.  multiple caths in past (chronically abnl ECG); e. cardiac cath 3/12: LAD stent patent, distal LAD 40-50%, small ostial D1 80%, ostial D2 60%, OM-1 stent patent (20-30%), proximal RCA 50%, prox- mRCA 40-50%; f. cath 2012 nonobs g. cath 06/2013 s/p STEMI- microvascular dz- treated medically   . History of TIA (transient ischemic attack)      Past Surgical History  Procedure Laterality Date  . Back surgery    . Cardiac defibrillator placement  07/2003; 10/2004; 2014  . Lumbar laminectomy/decompression microdiscectomy  10/2001    L5-S1/E-chart  . Knee arthroscopy Right   . Thyroidectomy  1970's  . Total knee arthroplasty Left 11/2003  . X-stop implantation  12/2004    L3-4; L4-5  . Fixation kyphoplasty thoracic spine  07/2007    T3, 4, 6 compression fractures  . Shoulder open rotator cuff repair Right 04/2008  . Lumbar laminectomy/decompression microdiscectomy  01/2010  .  Colectomy  03/2010    sigmoid left; transverse  . Peripherally inserted central catheter insertion  03/2010    removed upon discharge  . Colostomy reversal  12/2010  . Tonsillectomy and adenoidectomy  1969  . Appendectomy  03/2010  . Glaucoma surgery Right   . Biv icd genertaor change out  12/17/2012    "right now I don't have a defibrillator; I've been using an external one" (06/04/2013)  . Icd lead removal Left 12/17/2012    Procedure: ICD LEAD REMOVAL;  Surgeon: Evans Lance, MD;  Location: [facility];  Service: Cardiovascular;  Laterality: Left;  . Coronary  angioplasty with stent placement  10/2001; 09/2002; ?date    "I've got a total of 4" (04/10/2013)      Current Facility-Administered Medications  Medication Dose Route Frequency Provider Last Rate Last Dose  . 0.9 %  sodium chloride infusion   Intravenous Continuous Deboraha Sprang, MD      . 0.9 %  sodium chloride infusion   Intravenous Continuous Deboraha Sprang, MD      . chlorhexidine (HIBICLENS) 4 % liquid 4 application  60 mL Topical Once Deboraha Sprang, MD      . mupirocin ointment Drue Stager) 2 %   Nasal BID Deboraha Sprang, MD      . mupirocin ointment (BACTROBAN) 2 %              Allergies: Allergies  Allergen Reactions  . Coconut Oil Anaphylaxis and Hives  . Lansoprazole Nausea Only  . Penicillins Swelling    Throat swells  . Statins Other (See Comments)    cramps  . Lisinopril     Cough- but still tolerates it  . Lactose Intolerance (Gi) Diarrhea and Other (See Comments)    Patient reports abdominal cramping and diarrhea with lactose products.      History  Substance Use Topics  . Smoking status: Former Smoker -- 0.10 packs/day for 33 years    Types: Cigarettes    Quit date: 04/21/2001  . Smokeless tobacco: Never Used     Comment: "smoked ~ 1 pack/month"  . Alcohol Use: 0.6 oz/week    1 Glasses of wine per week      Family History  Problem Relation Age of Onset  . Diabetes Mother   . Diabetes Brother   . Arthritis      family history  . Prostate cancer      Family History      ROS:  Please see the history of present illness.     All other systems reviewed and negative.   Vital Signs: Blood pressure 118/68, pulse 60, temperature 98 F (36.7 C), temperature source Oral, resp. rate 18, height 5\' 4"  (1.626 m), weight 140 lb (63.504 kg), SpO2 100.00%.  PHYSICAL EXAM: General:  Well nourished, well developed female in no acute distress  HEENT: normal Lymph: no adenopathy Neck: no JVD Vascular: No carotid bruits; FA pulses 2+ bilaterally without  bruits Cardiac:  normal S1, S2; RRR; no murmur Device pocket well healed; without hematoma or erythema.  There is no tethering at site of extraction  Back: without kyphosis/scoliosis, no CVA tenderness Lungs:  clear to auscultation bilaterally, no wheezing, rhonchi or rales Abd: soft, nontender, no hepatomegaly Ext: no edema Musculoskeletal:  No deformities, BUE and BLE strength normal and equal Skin: warm and dry Neuro:  CNs 2-12 intact, no focal abnormalities noted Psych:  Normal affect   EKG:  SR 100 15.08.36 KLAD  Labs:   Lab Results  Component Value Date   WBC 4.6 07/25/2013   HGB 14.0 07/25/2013   HCT 42.4 07/25/2013   MCV 104.3* 07/25/2013   PLT 155.0 07/25/2013   No results found for this basename: NA, K, CL, CO2, BUN, CREATININE, CALCIUM, LABALBU, PROT, BILITOT, ALKPHOS, ALT, AST, GLUCOSE,  in the last 168 hours No results found for this basename: CKTOTAL, CKMB, TROPONINI,  in the last 72 hours Lab Results  Component Value Date   CHOL 226* 04/22/2013   HDL 119 04/22/2013   LDLCALC 93 04/22/2013   TRIG 68 04/22/2013   Lab Results  Component Value Date   DDIMER 0.59* 02/17/2011   BNP Pro B Natriuretic peptide (BNP)  Date/Time Value Ref Range Status  06/18/2013  1:27 PM 1223.0* 0 - 125 pg/mL Final  06/04/2013  9:57 AM 2458.0* 0 - 125 pg/mL Final  04/22/2013 12:00 PM 754.3* 0 - 125 pg/mL Final  04/10/2013  3:57 PM 3238.0* 0 - 125 pg/mL Final       ASSESSMENT AND PLAN:   ICM  Atrial tachycardia  CHF systolic  Prior ICD with extraction 2/2 infection  Renal insufficiency grade 3 stable  Macrocytosis    FOR CRT-D implantation with anticipation of a later date AV ablation because of refractory arrhythmia  We discussed single chamber device implantation but as her rhtyhm may require av ablation we will place CRT    Otherwise stable

## 2013-08-06 ENCOUNTER — Ambulatory Visit (HOSPITAL_COMMUNITY): Payer: Medicare Other

## 2013-08-06 ENCOUNTER — Encounter (HOSPITAL_COMMUNITY): Payer: Self-pay | Admitting: *Deleted

## 2013-08-06 DIAGNOSIS — I059 Rheumatic mitral valve disease, unspecified: Secondary | ICD-10-CM

## 2013-08-06 DIAGNOSIS — I2589 Other forms of chronic ischemic heart disease: Secondary | ICD-10-CM

## 2013-08-06 MED ORDER — YOU HAVE A PACEMAKER BOOK
Freq: Once | Status: AC
  Administered 2013-08-06: 08:00:00
  Filled 2013-08-06: qty 1

## 2013-08-06 MED ORDER — SPIRONOLACTONE 12.5 MG HALF TABLET
12.5000 mg | ORAL_TABLET | Freq: Every day | ORAL | Status: DC
  Administered 2013-08-06 – 2013-08-07 (×2): 12.5 mg via ORAL
  Filled 2013-08-06 (×3): qty 1

## 2013-08-06 MED ORDER — APIXABAN 5 MG PO TABS: 5.0000 mg | ORAL_TABLET | Freq: Two times a day (BID) | ORAL | Status: DC

## 2013-08-06 MED ORDER — OXYCODONE-ACETAMINOPHEN 5-325 MG PO TABS
1.0000 | ORAL_TABLET | Freq: Four times a day (QID) | ORAL | Status: DC | PRN
  Administered 2013-08-06 (×2): 1 via ORAL
  Filled 2013-08-06 (×2): qty 1

## 2013-08-06 NOTE — Progress Notes (Signed)
  Echocardiogram 2D Echocardiogram has been performed.  Valinda Hoar 08/06/2013, 8:28 AM

## 2013-08-06 NOTE — Op Note (Signed)
NAMEMINAL, STULLER NO.:  [id number]  MEDICAL RECORD NO.:  [id number]  LOCATION:  [id number]                        FACILITY:  [facility]  PHYSICIAN:  Deboraha Sprang, MD, FACCDATE OF BIRTH:  [birth year]  DATE OF PROCEDURE:  08/05/2013 DATE OF DISCHARGE:                              OPERATIVE REPORT   PREOPERATIVE DIAGNOSES: 1. Drug-refractory atrial tachycardia. 2. Ischemic cardiomyopathy. 3. Congestive heart failure with anticipated atrioventricular junction     ablation.  POSTOPERATIVE DIAGNOSES: 1. Drug-refractory atrial tachycardia. 2. Ischemic cardiomyopathy. 3. Congestive heart failure with anticipated atrioventricular junction     ablation.  PROCEDURE:  Dual-chamber defibrillator implantation with left ventricular lead placement.  DESCRIPTION OF PROCEDURE:  Following obtaining informed consent, the patient was brought to the Electrophysiology Laboratory and placed on the fluoroscopic table in a supine position.  After routine prep and drape of the right upper chest, lidocaine was infiltrated in the prepectoral subclavicular region.  Incision was made and carried down to layer of the prepectoral fascia.  Using electrocautery and sharp dissection, a pocket was formed similarly.  Hemostasis was obtained.  Thereafter, attention was turned to gain access to the extrathoracic right subclavian vein, which was accomplished with modest difficulty, but without the aspiration of air or puncture of the artery.  Three separate venipunctures were accomplished.  Guidewires were placed and retained.  Sequentially, an 8, 9.5, and 7-French sheath were placed, which were passed a St. Jude 7122Q, 58 cm active fixation ventricular lead [id number], a  Medtronic MB2 coronary sinus cannulation catheter and a St. Jude 1688TC active fixation atrial lead [id number].  Under fluoroscopic guidance, the RV lead was manipulated with a fair amount of difficulty through the RV apex where  we finally got a good location where the bipolar R-wave was greater than 30 with a pace impedance of 700 ohms and thresholds were 0.8 V at 0.5 milliseconds.  Current threshold 0.9 mA.  There was no diaphragmatic pacing at 10 V and the current of injury was brisk.  This lead was secured to the prepectoral fascia.  We then attempted to gain access to the coronary sinus which was accomplished with little difficulty.  An occlusive venogram demonstrated a high anterolateral branch and a low posterolateral branch.  We elected to pursue a high anterolateral branch.  This turned out to be surprisingly difficult because of a valve.  We ended up trying the quadripolar lead.  This turned out to be too big.  We ended up using an Attain 2 system with a bipolar lead, serial [id number].  We were able to deploy this lead to the junction between the middle and proximal third with the bipolar L-wave was 7.1 with a pace impedance of 921 threshold, 1.2 V at 0.5 milliseconds.  Current threshold was 1.1 mA. There was no diaphragmatic pacing at 10 V.  The Attain 2 subselection sheath was removed.  The 9.5-French sheath was removed and the atrial lead was deployed.  This was a St. Jude lead as outlined above and it was deployed to the right atrial appendage base where the bipolar P-wave was 5 with a pace impedance of 685, a threshold of 1.2 V at 0.5 milliseconds.  Current threshold of 1.9 mA and there was no diaphragmatic pacing at 10 V.  This lead was secured to the prepectoral fascia.  The LV deployment system was removed.  The leads were secured to the prepectoral fascia.  The leads were then attached to a [place] device, serial [id number].  Through the device, bipolar P- wave was 4.1 with a pace impedance of 540, threshold of 1.2 at 0.5.  The R-wave was greater than 12 with a pace impedance of 660, a threshold of 0.7 at 0.5, and the LV impedance was 950 with threshold of 1 V at  0.5. High-voltage impedance was 79 ohms.  With these acceptable parameters recorded, leads were secured to the prepectoral fascia.  The pocket was copiously irrigated with antibiotic containing saline solution.  Hemostasis was assured.  Leads and pulse generator were placed in the pocket and secured to the prepectoral fascia.  The wound was closed in 3 layers in normal fashion.  The wound was washed, dried, and benzoin Steri-Strips was applied.  Needle counts, sponge counts, and instrument counts were correct at the end of the procedure according to staff.  The patient tolerated the procedure without apparent complication.     Deboraha Sprang, MD, [facility]     SCK/MEDQ  D:  08/05/2013  T:  08/06/2013  Job:  [id number]

## 2013-08-06 NOTE — Progress Notes (Signed)
Patient ambulated by nurse tech after lunch.  Showed unsteady gait, dizziness, and two hours later reported nausea that she felt was caused by ambulating and associated symptoms.  Blood pressure low today, steadily improving.  ICD incision site sore; esp. With movement.  Bruising around site; Geneticist, molecular, Therapist, sports, examined. Compliant with sling.

## 2013-08-06 NOTE — Progress Notes (Signed)
Echo tech contacted about order, advised they are coming to perform echo.  Pt denies sob.  RUC drsg D&I.

## 2013-08-06 NOTE — Progress Notes (Signed)
Pt c/o RUC incisional pain 10/10, states "tylenol doesn't work" for her, received one dose and was able to sleep intermittently last night.  BP 91/39.  Pt alert, grimacing, guarding rt arm.  Dr. Caryl Comes paged.  Tele SB 52.

## 2013-08-06 NOTE — Progress Notes (Signed)
Patient with persistent nausea and dizziness after walking this afternoon.  Discussed with Dr Caryl Comes, will hold discharge until tomorrow.  Advised patient to increase activity as tolerated tonight.   Patsey Berthold, RN 08/06/2013 4:20 PM

## 2013-08-06 NOTE — Care Management Note (Addendum)
    Page 1 of 1   08/07/2013     2:56:09 PM CARE MANAGEMENT NOTE 08/07/2013  Patient:  [name],[name]   Account Number:  [id number]  Date Initiated:  08/06/2013  Documentation initiated by:  [facility]  Subjective/Objective Assessment:   63 y.o. female  Seen for an ICD implantion for primary prevention in the setting of ischemic cardiomyopathy with prior stenting.// Home alone.     Action/Plan:   PROCEDURE:  Dual-chamber defibrillator implantation with left ventricular lead placement.//Resume HH services.   Anticipated DC Date:  08/06/2013   Anticipated DC Plan:  [place]  CM consult      [facility] Choice  HOME HEALTH   Choice offered to / List presented to:          [facility] arranged  HH-1 RN  HH-10 DISEASE MANAGEMENT  HH-2 PT  [place]      [place] agency  [facility]   Status of service:  Completed, signed off Medicare Important Message given?   (If response is "NO", the following Medicare IM given date fields will be blank) Date Medicare IM given:   Date Additional Medicare IM given:    Discharge Disposition:    Per UR Regulation:  Reviewed for med. necessity/level of care/duration of stay  If discussed at [place] of Stay Meetings, dates discussed:    Comments:  08/06/13 ![place] Clydene Laming, RN, BSN, [place] [phone] Pt currently active with [facility] for RN/PT services.  Resumption of care requested.  Christa See, RN of [facility] notified.  No DME needs identified at this time.

## 2013-08-07 ENCOUNTER — Ambulatory Visit: Payer: Medicare Other | Admitting: Neurology

## 2013-08-07 MED ORDER — AMIODARONE HCL 200 MG PO TABS
400.0000 mg | ORAL_TABLET | Freq: Two times a day (BID) | ORAL | Status: DC
  Administered 2013-08-07: 400 mg via ORAL
  Filled 2013-08-07 (×2): qty 2

## 2013-08-07 MED ORDER — ALUM & MAG HYDROXIDE-SIMETH 200-200-20 MG/5ML PO SUSP
30.0000 mL | Freq: Four times a day (QID) | ORAL | Status: DC | PRN
  Administered 2013-08-07: 30 mL via ORAL
  Filled 2013-08-07: qty 30

## 2013-08-07 MED ORDER — SPIRONOLACTONE 25 MG PO TABS: 12.5000 mg | ORAL_TABLET | Freq: Every day | ORAL | Status: DC

## 2013-08-07 MED ORDER — AMIODARONE HCL 200 MG PO TABS: ORAL_TABLET | ORAL | Status: DC

## 2013-08-07 NOTE — Progress Notes (Signed)
   ELECTROPHYSIOLOGY ROUNDING NOTE    Patient Name: [name] Date of Encounter: 08/07/2013    SUBJECTIVE:Patient much improved this morning.  Feels like she is ready to go home.  No chest pain or shortness of breath.  Nausea improved.     TELEMETRY: Reviewed telemetry pt AV pacing>> atrial tach at 0600 Filed Vitals:   08/06/13 1631 08/06/13 1935 08/07/13 0005 08/07/13 0439  BP: 117/59 99/71 111/55 116/62  Pulse: 109 120 64 120  Temp: 97.9 F (36.6 C) 97.9 F (36.6 C) 98.1 F (36.7 C) 98.3 F (36.8 C)  TempSrc: Oral Oral Oral Oral  Resp: 20 16 18 20   Height:      Weight:   143 lb 1.3 oz (64.9 kg)   SpO2: 98% 100% 97% 97%    Intake/Output Summary (Last 24 hours) at 08/07/13 0659 Last data filed at 08/07/13 0439  Gross per 24 hour  Intake    780 ml  Output   1726 ml  Net   -946 ml    CURRENT MEDICATIONS: . sodium chloride   Intravenous Once  . amiodarone  200 mg Oral Daily  . apixaban  5 mg Oral BID  . carvedilol  3.125 mg Oral BID WC  . digoxin  0.0625 mg Oral Daily  . ezetimibe  10 mg Oral QHS  . folic acid-pyridoxine-cyancobalamin  1 tablet Oral Daily  . furosemide  40 mg Oral Daily  . levothyroxine  200 mcg Oral QAC breakfast  . losartan  12.5 mg Oral Daily  . omega-3 acid ethyl esters  1 g Oral Daily  . pantoprazole  40 mg Oral BID  . raloxifene  60 mg Oral Daily  . sodium chloride  3 mL Intravenous Q12H  . spironolactone  12.5 mg Oral Daily    Radiology/Studies:  Dg Chest 2 View 08/06/2013   CLINICAL DATA:  Soreness status post implant.  EXAM: CHEST  2 VIEW  COMPARISON:  July 06, 2013.  FINDINGS: Cardiomediastinal silhouette appears normal. Interval placement of right-sided pacemaker with leads in grossly good position. No pneumothorax or significant pleural effusion is noted. Status post kyphoplasty at 3 levels in the upper thoracic spine. No acute pulmonary disease is noted. Bony thorax appears intact.  IMPRESSION: No pneumothorax status post  right-sided pacemaker placement.   Electronically Signed   By: Sabino Dick M.D.   On: 08/06/2013 07:55   PHYSICAL EXAM Well developed and nourished in no acute distress HENT normal Neck supple with JVP-flat Clear Fast and irregular rate and rhythm, no murmurs or gallops Abd-soft with active BS No Clubbing cyanosis edema Skin-warm and dry A & Oriented  Grossly normal sensory and motor function   DEVICE INTERROGATION: Device interrogated by industry.  Lead values including impedence, sensing, threshold within normal values.    Active Problems:   Paroxysmal supraventricular tachycardia    Will increase amio to 400 bid and cardiovert in am if not spontanously today

## 2013-08-07 NOTE — Discharge Instructions (Signed)
° ° °  Supplemental Discharge Instructions for  Pacemaker/Defibrillator Patients  Activity No heavy lifting or vigorous activity with your left/right arm for 6 to 8 weeks.  Do not raise your left/right arm above your head for one week.  Gradually raise your affected arm as drawn below.             5/21                           5/22                         5/23                  5/24 __  NO DRIVING for 1 week.  WOUND CARE   Keep the wound area clean and dry.  Do not get this area wet for one week. No showers for one week;    The tape/steri-strips on your wound will fall off; do not pull them off.  No bandage is needed on the site.  DO  NOT apply any creams, oils, or ointments to the wound area.   If you notice any drainage or discharge from the wound, any swelling or bruising at the site, or you develop a fever > 101? F after you are discharged home, call the office at once.  Special Instructions   You are still able to use cellular telephones; use the ear opposite the side where you have your pacemaker/defibrillator.  Avoid carrying your cellular phone near your device.   When traveling through airports, show security personnel your identification card to avoid being screened in the metal detectors.  Ask the security personnel to use the hand wand.   Avoid arc welding equipment, MRI testing (magnetic resonance imaging), TENS units (transcutaneous nerve stimulators).  Call the office for questions about other devices.   Avoid electrical appliances that are in poor condition or are not properly grounded.   Microwave ovens are safe to be near or to operate.  Additional information for defibrillator patients should your device go off:   If your device goes off ONCE and you feel fine afterward, notify the device clinic nurses.   If your device goes off ONCE and you do not feel well afterward, call 911.   If your device goes off TWICE, call 911.   If your device goes off THREE times in one day,  call 911.  DO NOT DRIVE YOURSELF OR A FAMILY MEMBER WITH A DEFIBRILLATOR TO THE HOSPITAL--CALL 911.

## 2013-08-08 ENCOUNTER — Other Ambulatory Visit: Payer: Self-pay | Admitting: Internal Medicine

## 2013-08-08 SURGERY — CARDIOVERSION
Anesthesia: Monitor Anesthesia Care

## 2013-08-13 ENCOUNTER — Encounter: Payer: Self-pay | Admitting: Neurology

## 2013-08-13 ENCOUNTER — Telehealth: Payer: Self-pay | Admitting: *Deleted

## 2013-08-13 NOTE — Telephone Encounter (Signed)
[name] from [facility] called requesting OT twice weekly for 2 weeks.  Please advise

## 2013-08-13 NOTE — Telephone Encounter (Signed)
Ok with me 

## 2013-08-14 ENCOUNTER — Ambulatory Visit (INDEPENDENT_AMBULATORY_CARE_PROVIDER_SITE_OTHER): Payer: Medicare Other | Admitting: *Deleted

## 2013-08-14 DIAGNOSIS — I5023 Acute on chronic systolic (congestive) heart failure: Secondary | ICD-10-CM

## 2013-08-14 DIAGNOSIS — I5042 Chronic combined systolic (congestive) and diastolic (congestive) heart failure: Secondary | ICD-10-CM

## 2013-08-14 DIAGNOSIS — I429 Cardiomyopathy, unspecified: Secondary | ICD-10-CM

## 2013-08-14 DIAGNOSIS — I480 Paroxysmal atrial fibrillation: Secondary | ICD-10-CM

## 2013-08-14 DIAGNOSIS — I255 Ischemic cardiomyopathy: Secondary | ICD-10-CM

## 2013-08-14 LAB — MDC_IDC_ENUM_SESS_TYPE_INCLINIC
Battery Remaining Longevity: 68.4 mo
Brady Statistic RA Percent Paced: 89 %
Brady Statistic RV Percent Paced: 2.4 %
HIGH POWER IMPEDANCE MEASURED VALUE: 68.625
Lead Channel Impedance Value: 400 Ohm
Lead Channel Impedance Value: 437.5 Ohm
Lead Channel Pacing Threshold Amplitude: 1.25 V
Lead Channel Pacing Threshold Amplitude: 1.25 V
Lead Channel Pacing Threshold Amplitude: 1.75 V
Lead Channel Pacing Threshold Amplitude: 1.75 V
Lead Channel Pacing Threshold Pulse Width: 0.5 ms
Lead Channel Pacing Threshold Pulse Width: 0.5 ms
Lead Channel Pacing Threshold Pulse Width: 0.5 ms
Lead Channel Pacing Threshold Pulse Width: 0.5 ms
Lead Channel Sensing Intrinsic Amplitude: 12 mV
Lead Channel Setting Pacing Amplitude: 3.5 V
Lead Channel Setting Pacing Amplitude: 3.5 V
Lead Channel Setting Pacing Pulse Width: 0.5 ms
Lead Channel Setting Pacing Pulse Width: 0.5 ms
Lead Channel Setting Sensing Sensitivity: 0.5 mV
MDC IDC MSMT LEADCHNL LV PACING THRESHOLD AMPLITUDE: 1.75 V
MDC IDC MSMT LEADCHNL LV PACING THRESHOLD AMPLITUDE: 1.75 V
MDC IDC MSMT LEADCHNL LV PACING THRESHOLD PULSEWIDTH: 0.5 ms
MDC IDC MSMT LEADCHNL RA PACING THRESHOLD PULSEWIDTH: 0.5 ms
MDC IDC MSMT LEADCHNL RA SENSING INTR AMPL: 3.3 mV
MDC IDC MSMT LEADCHNL RV IMPEDANCE VALUE: 462.5 Ohm
MDC IDC PG SERIAL: 7184288
MDC IDC SESS DTM: 20150527161816
MDC IDC SET LEADCHNL LV PACING AMPLITUDE: 3.5 V
MDC IDC SET ZONE DETECTION INTERVAL: 250 ms
Zone Setting Detection Interval: 300 ms
Zone Setting Detection Interval: 330 ms

## 2013-08-14 NOTE — Telephone Encounter (Signed)
Spoke with Marlowe Kays advised of MDs message

## 2013-08-20 ENCOUNTER — Encounter (HOSPITAL_COMMUNITY): Payer: Self-pay

## 2013-08-20 NOTE — Progress Notes (Signed)
Patient ID: [name], female   DOB: Dec 19, 1950, 63 y.o.   MRN: [id number]  PCP: Dr. [name] ([facility] on [place]) Primary Cardiologist: Dr. Tamala Julian EP: Dr. Caryl Comes and Dr Rayann Heman (atrial tachy) EP: Dr. Ola Spurr [facility])   HPI: [name] is a 63 year old woman with an ICM (s/p ICD implantation in 2005; gen change 2006; dual chamber upgrade 2014 with subsequent extraction due to infection 11/2012, CRT-D implant 05/8754), chronic systolic heart failure, CAD last intervention 2004, last cath 06/2013 (patent the LAD and circumflex stents), myoview 04/2013 with no ischemia), prior CVA, chronic kidney disease and atrial arrhythmias.  Admitted 3/17-3/24/15 for acute rerespiratory distress, hypotension, tachycardia, and lactic acidosis of 6. EP consulted for Atrial Tach and at that time she had reverted back to NSR. AV node ablation and BIV considered, however due to multiple medical problems amiodarone was thought to be best option. She was later taken to cath lab for [place] which showed elevated right/left filling pressures and low cardiac cath. Post cath she was placed on Milrinone and diuresed with IV lasix. D/C weight 139 lbs.  Admitted 4/18-4/20/15 for visual changes and c/o CP. EKG with ST elevation inferior and anterolateral leads and was taken to cath lab showing double vessel CAD with patent stents mid LAD and OM1, mild disease RCA and possible micro-vascular disease.   Follow up for Heart Failure: Since last visit had CRT-D implant. During hospital stay had atrial tach and amiodarone was increased.Reports feeling good and every day she is improving. Denies SOB, PND, orthopnea or CP. + weakness and fatigue that she thinks is worse than in the past. No dizziness. Weight at home 136-140 lbs. Not a big appetite. Following a low salt diet and drinking less than 2L a day. HHRN/PT. Can't go around the grocery store without stopping rides in cart d/t fatigue.   SH: Married, lives in [place]. No ETOH and does not  smoke.   FH: Mother deceased: DM2, CKD        Father deceased: HTN, no CAD, bradycardia        Aunt and Uncle on Moms side both died in 80s from MI  ROS: All systems negative except as listed in HPI, PMH and Problem List.  Past Medical History  Diagnosis Date  . Spinal stenosis   . Numbness of foot   . Diverticulitis     a. s/p partial colectomy 1/12 with reversal in July 2012.  . Colitis, ischemic   . DJD (degenerative joint disease)   . Ischemic cardiomyopathy     a. Echo 06/16/10: EF 25-30%, anteroseptal and apical hypokinesis, moderate AI, mild MR. b.   . LV (left ventricular) mural thrombus   . CKD (chronic kidney disease), stage III   . GERD (gastroesophageal reflux disease)   . Hypertension   . Hypothyroidism   . Hyperlipidemia   . Compression fracture     a. of L2  . 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 2/2 atrial fibrillation with RVR in the context of hypokalemia  . PAF (paroxysmal atrial fibrillation)     a. on Eliquis, digoxin, and amiodarone  . Anemia   . H/O hiatal hernia   . Sinus bradycardia   . Mitral regurgitation   . CHF (congestive heart failure)   . CAD (coronary artery disease)     a.  Ant MI 8/03 with stenting to LAD;  b. staged PCI w/ DES to OM1 8/03;  c. s/p DES  to LAD 7/04;  d.  multiple caths in past (chronically abnl ECG); e. cardiac cath 3/12: LAD stent patent, distal LAD 40-50%, small ostial D1 80%, ostial D2 60%, OM-1 stent patent (20-30%), proximal RCA 50%, prox- mRCA 40-50%; f. cath 2012 nonobs g. cath 06/2013 s/p STEMI- microvascular dz- treated medically   . History of TIA (transient ischemic attack)   . Automatic implantable cardioverter-defibrillator in situ 08/05/2013    PACEMAKER /ICD      DR Caryl Comes    Current Outpatient Prescriptions  Medication Sig Dispense Refill  . amiodarone (PACERONE) 200 MG tablet Take 200 mg by mouth daily.      [name] apixaban (ELIQUIS) 5 MG TABS tablet Take 1 tablet (5 mg  total) by mouth 2 (two) times daily.  60 tablet  11  . carvedilol (COREG) 6.25 MG tablet Take 0.5 tablets (3.125 mg total) by mouth 2 (two) times daily with a meal.  60 tablet  6  . digoxin (LANOXIN) 0.125 MG tablet Take 0.5 tablets (0.0625 mg total) by mouth daily.  45 tablet  3  . ezetimibe (ZETIA) 10 MG tablet Take 10 mg by mouth at bedtime.       . fexofenadine (ALLEGRA) 180 MG tablet Take 180 mg by mouth daily as needed for allergies.       . fluticasone (FLONASE) 50 MCG/ACT nasal spray Place 2 sprays into both nostrils daily as needed for allergies.       . folic acid-pyridoxine-cyancobalamin (FOLTX) 2.5-25-2 MG TABS Take 1 tablet by mouth daily.       . furosemide (LASIX) 40 MG tablet Take 1 tablet (40 mg total) by mouth daily.  30 tablet  6  . hyoscyamine (LEVBID) 0.375 MG 12 hr tablet Take 0.375 mg by mouth every 12 (twelve) hours as needed for cramping.      [name] levothyroxine (SYNTHROID, LEVOTHROID) 200 MCG tablet Take 200 mcg by mouth daily before breakfast.       . losartan (COZAAR) 25 MG tablet Take 0.5 tablets (12.5 mg total) by mouth daily.  30 tablet  6  . nitroGLYCERIN (NITROSTAT) 0.4 MG SL tablet Place 0.4 mg under the tongue every 5 (five) minutes as needed for chest pain.       [name] omega-3 acid ethyl esters (LOVAZA) 1 G capsule Take 1 g by mouth daily.      . pantoprazole (PROTONIX) 40 MG tablet Take 1 tablet (40 mg total) by mouth 2 (two) times daily.  180 tablet  1  . polyethylene glycol (MIRALAX / GLYCOLAX) packet Take 17 g by mouth daily as needed (for constipation).       . raloxifene (EVISTA) 60 MG tablet Take 60 mg by mouth daily.      [name] spironolactone (ALDACTONE) 25 MG tablet Take 25 mg by mouth daily.       No current facility-administered medications for this encounter.    Filed Vitals:   08/21/13 1444  BP: 112/64  Pulse: 74  Resp: 16  Weight: 136 lb (61.689 kg)  SpO2: 98%    PHYSICAL EXAM: General:  Fatigued appearing. No resp difficulty; husband  present HEENT: normal Neck: supple. JVP 7. Carotids 2+ bilaterally; no bruits. No lymphadenopathy or thryomegaly appreciated. Cor: PMI normal. Regular rate & rhythm. No rubs, gallops or murmurs. Lungs: clear Abdomen: soft, nontender, nondistended. No hepatosplenomegaly. No bruits or masses. Good bowel sounds. Extremities: no cyanosis, clubbing, rash, edema, bilateral cool extremities; L DP 1+ palpable. R DP 2+ palpable, not able  to feel contact on L foot Neuro: alert & orientedx3, cranial nerves grossly intact. Moves all 4 extremities w/o difficulty. Affect pleasant.   ASSESSMENT & PLAN:  1) Chronic systolic HF: ICM s/p CRT-D, EF 20-25%, RV mildly dilated (07/2013) - NYHA III symptoms and volume status stable will continue lasix 40 mg daily with spiro 25 mg daily. - Patient continues to be very fatigued. Will stop coreg to see if this helps. She has very cool extremities and concerned for low output may need RHC to assess hemodynamics or could order CPX to assess cardiac limitations. - Will increase losartan to 25 mg daily, check BMET and pro-BNP in 1 week. - Patient has appointment with EP MD at [facility] to discuss AV node ablation now that she has CRT-D. - Reinforced the need and importance of daily weights, a low sodium diet, and fluid restriction (less than 2 L a day). Instructed to call the HF clinic if weight increases more than 3 lbs overnight or 5 lbs in a week.  2) Atrial Tachycardia - During admission for CRT-D had atrial tach and amiodarone was increased for a couple of weeks and now is back to 200 mg daily. Will continue current dose and as above meeting with EP at [facility] who would like to proceed with ablation 3 months after CRT-D implant. Last TSH and LFTs nl. She is aware to get yearly eye exams on amiodarone. 3) CAD - Double vessel CAD with patent stents mid LAD and OM1, mild disease RCA (06/2013) - No s/s of ischemia. Continue medical management 4) CKD stage III - Baseline Cr  1.2. Continue to follow.  5) ?PAD - Patient LE cool to touch L>R and she is not able to feel touch on L foot. Dorsalis pedis 1+. Will order ABIs.   F/U 4-6 weeks with MD Rande Brunt NP-C 3:01 PM

## 2013-08-21 ENCOUNTER — Ambulatory Visit (HOSPITAL_COMMUNITY)
Admission: RE | Admit: 2013-08-21 | Discharge: 2013-08-21 | Disposition: A | Discharge status: Home / Self Care | Payer: Medicare Other | Source: Ambulatory Visit | Attending: Internal Medicine | Admitting: Internal Medicine

## 2013-08-21 ENCOUNTER — Encounter (HOSPITAL_COMMUNITY): Payer: Self-pay

## 2013-08-21 VITALS — BP 112/64 | HR 74 | Resp 16 | Wt 136.0 lb

## 2013-08-21 DIAGNOSIS — I5042 Chronic combined systolic (congestive) and diastolic (congestive) heart failure: Secondary | ICD-10-CM

## 2013-08-21 DIAGNOSIS — Z9861 Coronary angioplasty status: Secondary | ICD-10-CM | POA: Insufficient documentation

## 2013-08-21 DIAGNOSIS — R000 Tachycardia, unspecified: Secondary | ICD-10-CM | POA: Insufficient documentation

## 2013-08-21 DIAGNOSIS — I480 Paroxysmal atrial fibrillation: Secondary | ICD-10-CM

## 2013-08-21 DIAGNOSIS — I5022 Chronic systolic (congestive) heart failure: Secondary | ICD-10-CM | POA: Insufficient documentation

## 2013-08-21 DIAGNOSIS — N189 Chronic kidney disease, unspecified: Secondary | ICD-10-CM

## 2013-08-21 DIAGNOSIS — N183 Chronic kidney disease, stage 3 unspecified: Secondary | ICD-10-CM | POA: Insufficient documentation

## 2013-08-21 DIAGNOSIS — I739 Peripheral vascular disease, unspecified: Secondary | ICD-10-CM

## 2013-08-21 DIAGNOSIS — I2510 Atherosclerotic heart disease of native coronary artery without angina pectoris: Secondary | ICD-10-CM | POA: Insufficient documentation

## 2013-08-21 DIAGNOSIS — I129 Hypertensive chronic kidney disease with stage 1 through stage 4 chronic kidney disease, or unspecified chronic kidney disease: Secondary | ICD-10-CM | POA: Insufficient documentation

## 2013-08-21 DIAGNOSIS — Z95810 Presence of automatic (implantable) cardiac defibrillator: Secondary | ICD-10-CM | POA: Insufficient documentation

## 2013-08-21 DIAGNOSIS — I4891 Unspecified atrial fibrillation: Secondary | ICD-10-CM

## 2013-08-21 DIAGNOSIS — I509 Heart failure, unspecified: Secondary | ICD-10-CM | POA: Insufficient documentation

## 2013-08-21 MED ORDER — LOSARTAN POTASSIUM 25 MG PO TABS: 25.0000 mg | ORAL_TABLET | Freq: Every day | ORAL | Status: DC

## 2013-08-21 NOTE — Patient Instructions (Signed)
Stop Coreg for now.  Increase losartan to 25 mg daily.  Check BMET and pro-BNP next week  Will order ankle-brachial index.  F/U 4-6 weeks   Do the following things EVERYDAY: 1) Weigh yourself in the morning before breakfast. Write it down and keep it in a log. 2) Take your medicines as prescribed 3) Eat low salt foods-Limit salt (sodium) to 2000 mg per day.  4) Stay as active as you can everyday 5) Limit all fluids for the day to less than 2 liters 6)

## 2013-08-22 ENCOUNTER — Ambulatory Visit (HOSPITAL_COMMUNITY)
Admission: RE | Admit: 2013-08-22 | Discharge: 2013-08-22 | Disposition: A | Discharge status: Home / Self Care | Payer: Medicare Other | Source: Ambulatory Visit | Attending: Internal Medicine | Admitting: Internal Medicine

## 2013-08-22 DIAGNOSIS — M79609 Pain in unspecified limb: Secondary | ICD-10-CM

## 2013-08-22 DIAGNOSIS — I739 Peripheral vascular disease, unspecified: Secondary | ICD-10-CM

## 2013-08-22 DIAGNOSIS — Z87891 Personal history of nicotine dependence: Secondary | ICD-10-CM | POA: Insufficient documentation

## 2013-08-22 DIAGNOSIS — E119 Type 2 diabetes mellitus without complications: Secondary | ICD-10-CM | POA: Insufficient documentation

## 2013-08-22 NOTE — Progress Notes (Signed)
VASCULAR LAB PRELIMINARY  ARTERIAL  ABI completed:    RIGHT    LEFT    PRESSURE WAVEFORM  PRESSURE WAVEFORM  BRACHIAL 135 Triphasic  BRACHIAL 126 Triphasic   DP 138 Biphasic DP 137 Biphasic   AT   AT    PT 301 Biphasic  PT 156 Biphasic   PER   PER    GREAT TOE  NA GREAT TOE  NA    RIGHT LEFT  ABI 2.18 1.13     Nani Ravens, RVT 08/22/2013, 4:08 PM

## 2013-08-26 ENCOUNTER — Telehealth: Payer: Self-pay | Admitting: *Deleted

## 2013-08-26 NOTE — Telephone Encounter (Signed)
[name] called requesting VO for OT twice weekly for 4 additional weeks.  Please advise

## 2013-08-26 NOTE — Telephone Encounter (Signed)
Left detailed message on VM.

## 2013-08-26 NOTE — Telephone Encounter (Signed)
yes

## 2013-08-29 ENCOUNTER — Encounter: Payer: Self-pay | Admitting: Internal Medicine

## 2013-08-29 ENCOUNTER — Other Ambulatory Visit (HOSPITAL_COMMUNITY): Payer: Medicare Other

## 2013-09-02 ENCOUNTER — Other Ambulatory Visit: Payer: Self-pay

## 2013-09-02 MED ORDER — EZETIMIBE 10 MG PO TABS: 10.0000 mg | ORAL_TABLET | Freq: Every day | ORAL | Status: DC

## 2013-09-02 MED ORDER — APIXABAN 5 MG PO TABS: 5.0000 mg | ORAL_TABLET | Freq: Two times a day (BID) | ORAL | Status: AC

## 2013-09-04 ENCOUNTER — Ambulatory Visit: Payer: Medicare Other | Admitting: Internal Medicine

## 2013-09-05 ENCOUNTER — Telehealth: Payer: Self-pay | Admitting: *Deleted

## 2013-09-05 ENCOUNTER — Telehealth: Payer: Self-pay | Admitting: Internal Medicine

## 2013-09-05 NOTE — Telephone Encounter (Signed)
Inquiring about A Fib, HR control when working with patient (she explained pt recently in hospital for AFib and has upcoming ablation procedure for this) --  Advised to send to doctor who is seeing her right now and planning intervention.  I explained that I would have Dr. Caryl Comes sign orders they sent for post op defib implant. She is agreeable to this.

## 2013-09-05 NOTE — Telephone Encounter (Signed)
[name] with out patient rehab needs clarification on orders. Please call and advise.

## 2013-09-05 NOTE — Telephone Encounter (Signed)
Caller requesting verbal orders for Home safety, Cardio Pulmonary training and gait training 1/weekly x 1 week   And  2/weekly x 6 weeks. Ok per MD. Lynford Citizen and gave verbal orders as requested.

## 2013-09-09 ENCOUNTER — Telehealth: Payer: Self-pay | Admitting: *Deleted

## 2013-09-09 NOTE — Telephone Encounter (Signed)
PA to [facility]  for eliquis

## 2013-09-10 ENCOUNTER — Encounter: Payer: Self-pay | Admitting: *Deleted

## 2013-09-10 ENCOUNTER — Telehealth: Payer: Self-pay | Admitting: Internal Medicine

## 2013-09-10 NOTE — Telephone Encounter (Signed)
New message     Patients HR is 112 bpm at rest and irregular.  She has had a 5 lbs wt loss since last thurs---and 18 lbs since June 10th.  Want to make sure Dr Caryl Comes is aware

## 2013-09-10 NOTE — Telephone Encounter (Signed)
Spoke with [facility] nurse. Reporting irregular HR 110s currently.  BP 120/50.  Her SOB with activity is unchanged. Her po intake is poor recently. Rec she try boost or ensure for supplementation. Nurse states Sherri wanted to know if HR >100. Instructed nurse to contact PCP also, esp in regard to wt loss. Also states patient called Monday requesting medication for anxiety issues and is waiting for call back.

## 2013-09-10 NOTE — Telephone Encounter (Signed)
[facility] PA approval over the phone for eliquis, I notified patient and [place].

## 2013-09-11 ENCOUNTER — Telehealth (HOSPITAL_COMMUNITY): Payer: Self-pay

## 2013-09-11 NOTE — Telephone Encounter (Signed)
Patient aware and appreciative. Renee Pain

## 2013-09-16 ENCOUNTER — Ambulatory Visit (INDEPENDENT_AMBULATORY_CARE_PROVIDER_SITE_OTHER): Payer: Medicare Other | Admitting: Internal Medicine

## 2013-09-16 ENCOUNTER — Encounter: Payer: Self-pay | Admitting: Internal Medicine

## 2013-09-16 VITALS — BP 118/62 | HR 80 | Temp 98.3°F | Resp 16 | Ht 64.0 in | Wt 129.0 lb

## 2013-09-16 DIAGNOSIS — E039 Hypothyroidism, unspecified: Secondary | ICD-10-CM

## 2013-09-16 DIAGNOSIS — F411 Generalized anxiety disorder: Secondary | ICD-10-CM

## 2013-09-16 DIAGNOSIS — E785 Hyperlipidemia, unspecified: Secondary | ICD-10-CM

## 2013-09-16 DIAGNOSIS — I2510 Atherosclerotic heart disease of native coronary artery without angina pectoris: Secondary | ICD-10-CM

## 2013-09-16 MED ORDER — CLONAZEPAM 0.5 MG PO TABS: 0.5000 mg | ORAL_TABLET | Freq: Two times a day (BID) | ORAL | Status: AC | PRN

## 2013-09-16 NOTE — Telephone Encounter (Signed)
Left message informing to contact PCP about anxiety issues/medication.  Left message with [facility] Go nurse also. And also advised to call us if HR irregular and remains above >100

## 2013-09-16 NOTE — Progress Notes (Signed)
Subjective:    Patient ID: [name], female    DOB: July 15, 1950, 63 y.o.   MRN: [id number]  Thyroid Problem Symptoms include anxiety and fatigue. Patient reports no cold intolerance, constipation, depressed mood, diaphoresis, diarrhea, dry skin, hair loss, heat intolerance, hoarse voice, leg swelling, nail problem, palpitations, tremors, visual change, weight gain or weight loss. The symptoms have been stable. Past treatments include levothyroxine. The treatment provided significant relief. Her past medical history is significant for atrial fibrillation, heart failure and hyperlipidemia. Risk factors include amiodarone use.      Review of Systems  Constitutional: Positive for fatigue. Negative for fever, chills, weight loss, weight gain, diaphoresis, activity change, appetite change and unexpected weight change.  HENT: Negative.  Negative for hoarse voice.   Eyes: Negative.   Respiratory: Negative.  Negative for apnea, cough, choking, chest tightness, shortness of breath, wheezing and stridor.   Cardiovascular: Negative.  Negative for chest pain, palpitations and leg swelling.  Gastrointestinal: Negative.  Negative for nausea, vomiting, abdominal pain, diarrhea, constipation and blood in stool.  Endocrine: Negative.  Negative for cold intolerance and heat intolerance.  Genitourinary: Negative.   Musculoskeletal: Negative.  Negative for arthralgias, back pain, myalgias, neck pain and neck stiffness.  Skin: Negative.  Negative for rash.  Allergic/Immunologic: Negative.   Neurological: Negative.  Negative for dizziness, tremors, speech difficulty, weakness, light-headedness and numbness.  Hematological: Negative.  Negative for adenopathy. Does not bruise/bleed easily.  Psychiatric/Behavioral: Positive for sleep disturbance. Negative for suicidal ideas, hallucinations, behavioral problems, confusion, self-injury, dysphoric mood, decreased concentration and agitation. The patient is  nervous/anxious. The patient is not hyperactive.        She complains of waves of anxiety and a feeling of panic       Objective:   Physical Exam  Vitals reviewed. Constitutional: She is oriented to person, place, and time. She appears well-developed and well-nourished. No distress.  HENT:  Head: Normocephalic and atraumatic.  Mouth/Throat: Oropharynx is clear and moist. No oropharyngeal exudate.  Eyes: Conjunctivae are normal. Right eye exhibits no discharge. Left eye exhibits no discharge. No scleral icterus.  Neck: Normal range of motion. Neck supple. No JVD present. No tracheal deviation present. No thyromegaly present.  Cardiovascular: Normal rate, regular rhythm, S1 normal, S2 normal and intact distal pulses.  Exam reveals gallop and S3. Exam reveals no S4 and no friction rub.   No murmur heard. Pulses:      Carotid pulses are 1+ on the right side, and 1+ on the left side.      Radial pulses are 1+ on the right side, and 1+ on the left side.       Femoral pulses are 1+ on the right side, and 1+ on the left side.      Popliteal pulses are 1+ on the right side, and 1+ on the left side.       Dorsalis pedis pulses are 1+ on the right side, and 1+ on the left side.       Posterior tibial pulses are 1+ on the right side, and 1+ on the left side.  Pulmonary/Chest: Effort normal and breath sounds normal. No stridor. No respiratory distress. She has no wheezes. She has no rales. She exhibits no tenderness.  Abdominal: Soft. Bowel sounds are normal. She exhibits no distension and no mass. There is no tenderness. There is no rebound and no guarding.  Musculoskeletal: Normal range of motion. She exhibits no edema and no tenderness.  Lymphadenopathy:  She has no cervical adenopathy.  Neurological: She is oriented to person, place, and time.  Skin: Skin is warm and dry. No rash noted. She is not diaphoretic. No erythema. No pallor.  Psychiatric: She has a normal mood and affect. Her behavior  is normal. Judgment and thought content normal.     Lab Results  Component Value Date   WBC 4.6 07/25/2013   HGB 14.0 07/25/2013   HCT 42.4 07/25/2013   PLT 155.0 07/25/2013   GLUCOSE 102* 07/25/2013   CHOL 226* 04/22/2013   TRIG 68 04/22/2013   HDL 119 04/22/2013   LDLDIRECT 126.3 12/15/2011   LDLCALC 93 04/22/2013   ALT 33 07/12/2013   AST 68* 07/12/2013   NA 139 07/25/2013   K 4.5 07/25/2013   CL 104 07/25/2013   CREATININE 1.3* 07/25/2013   BUN 15 07/25/2013   CO2 27 07/25/2013   TSH 1.930 06/18/2013   INR 1.08 07/06/2013   HGBA1C 5.7* 06/06/2012       Assessment & Plan:

## 2013-09-16 NOTE — Assessment & Plan Note (Signed)
She will not take zetia because it is too expensive

## 2013-09-16 NOTE — Progress Notes (Signed)
Pre visit review using our clinic review tool, if applicable. No additional management support is needed unless otherwise documented below in the visit note. 

## 2013-09-16 NOTE — Assessment & Plan Note (Signed)
I will recheck her TSH and will adjust her dose if needed 

## 2013-09-16 NOTE — Assessment & Plan Note (Signed)
Will try low dose klonopin to help with this

## 2013-09-16 NOTE — Patient Instructions (Signed)
Hypothyroidism The thyroid is a large gland located in the lower front of your neck. The thyroid gland helps control metabolism. Metabolism is how your body handles food. It controls metabolism with the hormone thyroxine. When this gland is underactive (hypothyroid), it produces too little hormone.  CAUSES These include:   Absence or destruction of thyroid tissue.  Goiter due to iodine deficiency.  Goiter due to medications.  Congenital defects (since birth).  Problems with the pituitary. This causes a lack of TSH (thyroid stimulating hormone). This hormone tells the thyroid to turn out more hormone. SYMPTOMS  Lethargy (feeling as though you have no energy)  Cold intolerance  Weight gain (in spite of normal food intake)  Dry skin  Coarse hair  Menstrual irregularity (if severe, may lead to infertility)  Slowing of thought processes Cardiac problems are also caused by insufficient amounts of thyroid hormone. Hypothyroidism in the newborn is cretinism, and is an extreme form. It is important that this form be treated adequately and immediately or it will lead rapidly to retarded physical and mental development. DIAGNOSIS  To prove hypothyroidism, your caregiver may do blood tests and ultrasound tests. Sometimes the signs are hidden. It may be necessary for your caregiver to watch this illness with blood tests either before or after diagnosis and treatment. TREATMENT  Low levels of thyroid hormone are increased by using synthetic thyroid hormone. This is a safe, effective treatment. It usually takes about four weeks to gain the full effects of the medication. After you have the full effect of the medication, it will generally take another four weeks for problems to leave. Your caregiver may start you on low doses. If you have had heart problems the dose may be gradually increased. It is generally not an emergency to get rapidly to normal. HOME CARE INSTRUCTIONS   Take your  medications as your caregiver suggests. Let your caregiver know of any medications you are taking or start taking. Your caregiver will help you with dosage schedules.  As your condition improves, your dosage needs may increase. It will be necessary to have continuing blood tests as suggested by your caregiver.  Report all suspected medication side effects to your caregiver. SEEK MEDICAL CARE IF: Seek medical care if you develop:  Sweating.  Tremulousness (tremors).  Anxiety.  Rapid weight loss.  Heat intolerance.  Emotional swings.  Diarrhea.  Weakness. SEEK IMMEDIATE MEDICAL CARE IF:  You develop chest pain, an irregular heart beat (palpitations), or a rapid heart beat. MAKE SURE YOU:   Understand these instructions.  Will watch your condition.  Will get help right away if you are not doing well or get worse. Document Released: 03/07/2005 Document Revised: 05/30/2011 Document Reviewed: 10/26/2007 [employer] Patient Information 2015 [employer]. This information is not intended to replace advice given to you by your health care provider. Make sure you discuss any questions you have with your health care provider.  

## 2013-09-17 DIAGNOSIS — I509 Heart failure, unspecified: Secondary | ICD-10-CM

## 2013-09-17 DIAGNOSIS — I4891 Unspecified atrial fibrillation: Secondary | ICD-10-CM

## 2013-09-26 ENCOUNTER — Other Ambulatory Visit (HOSPITAL_COMMUNITY): Payer: Self-pay | Admitting: Gastroenterology

## 2013-09-26 DIAGNOSIS — R1084 Generalized abdominal pain: Secondary | ICD-10-CM

## 2013-09-26 DIAGNOSIS — R110 Nausea: Secondary | ICD-10-CM

## 2013-09-27 ENCOUNTER — Emergency Department (HOSPITAL_COMMUNITY)
Admission: EM | Admit: 2013-09-27 | Discharge: 2013-09-27 | Disposition: A | Discharge status: Home / Self Care | Payer: Medicare Other | Attending: Emergency Medicine | Admitting: Emergency Medicine

## 2013-09-27 ENCOUNTER — Ambulatory Visit (HOSPITAL_COMMUNITY)
Admission: RE | Admit: 2013-09-27 | Discharge: 2013-09-27 | Disposition: A | Discharge status: Home / Self Care | Payer: Medicare Other | Source: Ambulatory Visit | Attending: Gastroenterology | Admitting: Gastroenterology

## 2013-09-27 ENCOUNTER — Emergency Department (HOSPITAL_COMMUNITY): Payer: Medicare Other

## 2013-09-27 ENCOUNTER — Encounter (HOSPITAL_COMMUNITY): Payer: Self-pay | Admitting: Emergency Medicine

## 2013-09-27 DIAGNOSIS — Z8781 Personal history of (healed) traumatic fracture: Secondary | ICD-10-CM | POA: Insufficient documentation

## 2013-09-27 DIAGNOSIS — E785 Hyperlipidemia, unspecified: Secondary | ICD-10-CM | POA: Insufficient documentation

## 2013-09-27 DIAGNOSIS — I4891 Unspecified atrial fibrillation: Secondary | ICD-10-CM | POA: Insufficient documentation

## 2013-09-27 DIAGNOSIS — R1084 Generalized abdominal pain: Secondary | ICD-10-CM | POA: Insufficient documentation

## 2013-09-27 DIAGNOSIS — R011 Cardiac murmur, unspecified: Secondary | ICD-10-CM | POA: Insufficient documentation

## 2013-09-27 DIAGNOSIS — N183 Chronic kidney disease, stage 3 unspecified: Secondary | ICD-10-CM | POA: Insufficient documentation

## 2013-09-27 DIAGNOSIS — R197 Diarrhea, unspecified: Secondary | ICD-10-CM | POA: Insufficient documentation

## 2013-09-27 DIAGNOSIS — R079 Chest pain, unspecified: Secondary | ICD-10-CM | POA: Insufficient documentation

## 2013-09-27 DIAGNOSIS — Z9889 Other specified postprocedural states: Secondary | ICD-10-CM | POA: Insufficient documentation

## 2013-09-27 DIAGNOSIS — Z880 Allergy status to penicillin: Secondary | ICD-10-CM | POA: Insufficient documentation

## 2013-09-27 DIAGNOSIS — IMO0002 Reserved for concepts with insufficient information to code with codable children: Secondary | ICD-10-CM | POA: Insufficient documentation

## 2013-09-27 DIAGNOSIS — Z8673 Personal history of transient ischemic attack (TIA), and cerebral infarction without residual deficits: Secondary | ICD-10-CM | POA: Insufficient documentation

## 2013-09-27 DIAGNOSIS — Z8739 Personal history of other diseases of the musculoskeletal system and connective tissue: Secondary | ICD-10-CM | POA: Insufficient documentation

## 2013-09-27 DIAGNOSIS — I509 Heart failure, unspecified: Secondary | ICD-10-CM | POA: Insufficient documentation

## 2013-09-27 DIAGNOSIS — I129 Hypertensive chronic kidney disease with stage 1 through stage 4 chronic kidney disease, or unspecified chronic kidney disease: Secondary | ICD-10-CM | POA: Insufficient documentation

## 2013-09-27 DIAGNOSIS — Z95810 Presence of automatic (implantable) cardiac defibrillator: Secondary | ICD-10-CM | POA: Insufficient documentation

## 2013-09-27 DIAGNOSIS — I059 Rheumatic mitral valve disease, unspecified: Secondary | ICD-10-CM | POA: Insufficient documentation

## 2013-09-27 DIAGNOSIS — R112 Nausea with vomiting, unspecified: Secondary | ICD-10-CM | POA: Insufficient documentation

## 2013-09-27 DIAGNOSIS — D649 Anemia, unspecified: Secondary | ICD-10-CM | POA: Insufficient documentation

## 2013-09-27 DIAGNOSIS — R109 Unspecified abdominal pain: Secondary | ICD-10-CM

## 2013-09-27 DIAGNOSIS — K219 Gastro-esophageal reflux disease without esophagitis: Secondary | ICD-10-CM | POA: Insufficient documentation

## 2013-09-27 DIAGNOSIS — E039 Hypothyroidism, unspecified: Secondary | ICD-10-CM | POA: Insufficient documentation

## 2013-09-27 DIAGNOSIS — Z9861 Coronary angioplasty status: Secondary | ICD-10-CM | POA: Insufficient documentation

## 2013-09-27 DIAGNOSIS — Z87891 Personal history of nicotine dependence: Secondary | ICD-10-CM | POA: Insufficient documentation

## 2013-09-27 DIAGNOSIS — I2510 Atherosclerotic heart disease of native coronary artery without angina pectoris: Secondary | ICD-10-CM | POA: Insufficient documentation

## 2013-09-27 DIAGNOSIS — Z79899 Other long term (current) drug therapy: Secondary | ICD-10-CM | POA: Insufficient documentation

## 2013-09-27 LAB — CBC WITH DIFFERENTIAL/PLATELET
BASOS PCT: 0 % (ref 0–1)
Basophils Absolute: 0 10*3/uL (ref 0.0–0.1)
Eosinophils Absolute: 0 10*3/uL (ref 0.0–0.7)
Eosinophils Relative: 0 % (ref 0–5)
HEMATOCRIT: 38.9 % (ref 36.0–46.0)
Hemoglobin: 13.3 g/dL (ref 12.0–15.0)
LYMPHS ABS: 1.2 10*3/uL (ref 0.7–4.0)
Lymphocytes Relative: 22 % (ref 12–46)
MCH: 34 pg (ref 26.0–34.0)
MCHC: 34.2 g/dL (ref 30.0–36.0)
MCV: 99.5 fL (ref 78.0–100.0)
MONO ABS: 0.8 10*3/uL (ref 0.1–1.0)
Monocytes Relative: 16 % — ABNORMAL HIGH (ref 3–12)
Neutro Abs: 3.2 10*3/uL (ref 1.7–7.7)
Neutrophils Relative %: 62 % (ref 43–77)
Platelets: 153 10*3/uL (ref 150–400)
RBC: 3.91 MIL/uL (ref 3.87–5.11)
RDW: 16.3 % — ABNORMAL HIGH (ref 11.5–15.5)
WBC: 5.2 10*3/uL (ref 4.0–10.5)

## 2013-09-27 LAB — COMPREHENSIVE METABOLIC PANEL
ALBUMIN: 3.3 g/dL — AB (ref 3.5–5.2)
ALK PHOS: 42 U/L (ref 39–117)
ALT: 33 U/L (ref 0–35)
ANION GAP: 19 — AB (ref 5–15)
AST: 80 U/L — ABNORMAL HIGH (ref 0–37)
BUN: 8 mg/dL (ref 6–23)
CO2: 22 mEq/L (ref 19–32)
CREATININE: 0.8 mg/dL (ref 0.50–1.10)
Calcium: 8.5 mg/dL (ref 8.4–10.5)
Chloride: 97 mEq/L (ref 96–112)
GFR calc Af Amer: 89 mL/min — ABNORMAL LOW (ref >90)
GFR, EST NON AFRICAN AMERICAN: 77 mL/min — AB (ref >90)
Glucose, Bld: 89 mg/dL (ref 70–99)
Potassium: 5.7 mEq/L — ABNORMAL HIGH (ref 3.7–5.3)
Sodium: 138 mEq/L (ref 137–147)
Total Bilirubin: 0.9 mg/dL (ref 0.3–1.2)
Total Protein: 7.4 g/dL (ref 6.0–8.3)

## 2013-09-27 LAB — URINALYSIS, ROUTINE W REFLEX MICROSCOPIC
Glucose, UA: NEGATIVE mg/dL
Hgb urine dipstick: NEGATIVE
KETONES UR: 15 mg/dL — AB
Nitrite: NEGATIVE
PH: 6.5 (ref 5.0–8.0)
PROTEIN: NEGATIVE mg/dL
Specific Gravity, Urine: 1.016 (ref 1.005–1.030)
UROBILINOGEN UA: 1 mg/dL (ref 0.0–1.0)

## 2013-09-27 LAB — URINE MICROSCOPIC-ADD ON

## 2013-09-27 LAB — LIPASE, BLOOD: Lipase: 20 U/L (ref 11–59)

## 2013-09-27 LAB — LACTIC ACID, PLASMA: LACTIC ACID, VENOUS: 1.3 mmol/L (ref 0.5–2.2)

## 2013-09-27 LAB — TROPONIN I: Troponin I: <0.3 ng/mL (ref <0.30)

## 2013-09-27 MED ORDER — ONDANSETRON HCL 4 MG/2ML IJ SOLN
4.0000 mg | Freq: Once | INTRAMUSCULAR | Status: AC
  Administered 2013-09-27: 4 mg via INTRAVENOUS
  Filled 2013-09-27: qty 2

## 2013-09-27 MED ORDER — IOHEXOL 300 MG/ML  SOLN: 25.0000 mL | INTRAMUSCULAR | Status: AC

## 2013-09-27 MED ORDER — IOHEXOL 300 MG/ML  SOLN
100.0000 mL | Freq: Once | INTRAMUSCULAR | Status: AC | PRN
  Administered 2013-09-27: 100 mL via INTRAVENOUS

## 2013-09-27 MED ORDER — SODIUM CHLORIDE 0.9 % IV BOLUS (SEPSIS)
500.0000 mL | Freq: Once | INTRAVENOUS | Status: AC
Start: 2013-09-27 — End: 2013-09-27
  Administered 2013-09-27: 500 mL via INTRAVENOUS

## 2013-09-27 NOTE — ED Notes (Signed)
Patient was scheduled to have CT scan abdomen. Started taking the contrast and has been N/V/D since. 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

## 2013-09-27 NOTE — ED Notes (Signed)
PA at bedside.

## 2013-09-27 NOTE — ED Notes (Signed)
IV team at bedside 

## 2013-09-27 NOTE — ED Notes (Signed)
Pt. Tolerating PO fluids at this time.

## 2013-09-27 NOTE — ED Notes (Signed)
Two nurses attempted IV starts unable to and patient requested IV team. Spoke with IV team and will attempt IV.

## 2013-09-27 NOTE — ED Notes (Signed)
EKG completed given to EDP.  

## 2013-09-27 NOTE — Discharge Instructions (Signed)
Take zofran for nausea and vomiting  Return to the emergency department if you develop any changing/worsening condition, repeated vomiting, fever  or any other concerns (please read additional information regarding your condition below)    Abdominal Pain Many things can cause abdominal pain. Usually, abdominal pain is not caused by a disease and will improve without treatment. It can often be observed and treated at home. Your health care provider will do a physical exam and possibly order blood tests and X-rays to help determine the seriousness of your pain. However, in many cases, more time must pass before a clear cause of the pain can be found. Before that point, your health care provider may not know if you need more testing or further treatment. HOME CARE INSTRUCTIONS  Monitor your abdominal pain for any changes. The following actions may help to alleviate any discomfort you are experiencing:  Only take over-the-counter or prescription medicines as directed by your health care provider.  Do not take laxatives unless directed to do so by your health care provider.  Try a clear liquid diet (broth, tea, or water) as directed by your health care provider. Slowly move to a bland diet as tolerated. SEEK MEDICAL CARE IF:  You have unexplained abdominal pain.  You have abdominal pain associated with nausea or diarrhea.  You have pain when you urinate or have a bowel movement.  You experience abdominal pain that wakes you in the night.  You have abdominal pain that is worsened or improved by eating food.  You have abdominal pain that is worsened with eating fatty foods.  You have a fever. SEEK IMMEDIATE MEDICAL CARE IF:   Your pain does not go away within 2 hours.  You keep throwing up (vomiting).  Your pain is felt only in portions of the abdomen, such as the right side or the left lower portion of the abdomen.  You pass bloody or black tarry stools. MAKE SURE YOU:  Understand  these instructions.   Will watch your condition.   Will get help right away if you are not doing well or get worse.  Document Released: 12/15/2004 Document Revised: 03/12/2013 Document Reviewed: 11/14/2012 [facility] Patient Information 2015 [place], [place]. This information is not intended to replace advice given to you by your health care provider. Make sure you discuss any questions you have with your health care provider.  Nausea and Vomiting Nausea is a sick feeling that often comes before throwing up (vomiting). Vomiting is a reflex where stomach contents come out of your mouth. Vomiting can cause severe loss of body fluids (dehydration). Children and elderly adults can become dehydrated quickly, especially if they also have diarrhea. Nausea and vomiting are symptoms of a condition or disease. It is important to find the cause of your symptoms. CAUSES   Direct irritation of the stomach lining. This irritation can result from increased acid production (gastroesophageal reflux disease), infection, food poisoning, taking certain medicines (such as nonsteroidal anti-inflammatory drugs), alcohol use, or tobacco use.  Signals from the brain.These signals could be caused by a headache, heat exposure, an inner ear disturbance, increased pressure in the brain from injury, infection, a tumor, or a concussion, pain, emotional stimulus, or metabolic problems.  An obstruction in the gastrointestinal tract (bowel obstruction).  Illnesses such as diabetes, hepatitis, gallbladder problems, appendicitis, kidney problems, cancer, sepsis, atypical symptoms of a heart attack, or eating disorders.  Medical treatments such as chemotherapy and radiation.  Receiving medicine that makes you sleep (general anesthetic) during  surgery. DIAGNOSIS Your caregiver may ask for tests to be done if the problems do not improve after a few days. Tests may also be done if symptoms are severe or if the reason for the  nausea and vomiting is not clear. Tests may include:  Urine tests.  Blood tests.  Stool tests.  Cultures (to look for evidence of infection).  X-rays or other imaging studies. Test results can help your caregiver make decisions about treatment or the need for additional tests. TREATMENT You need to stay well hydrated. Drink frequently but in small amounts.You may wish to drink water, sports drinks, clear broth, or eat frozen ice pops or gelatin dessert to help stay hydrated.When you eat, eating slowly may help prevent nausea.There are also some antinausea medicines that may help prevent nausea. HOME CARE INSTRUCTIONS   Take all medicine as directed by your caregiver.  If you do not have an appetite, do not force yourself to eat. However, you must continue to drink fluids.  If you have an appetite, eat a normal diet unless your caregiver tells you differently.  Eat a variety of complex carbohydrates (rice, wheat, potatoes, bread), lean meats, yogurt, fruits, and vegetables.  Avoid high-fat foods because they are more difficult to digest.  Drink enough water and fluids to keep your urine clear or pale yellow.  If you are dehydrated, ask your caregiver for specific rehydration instructions. Signs of dehydration may include:  Severe thirst.  Dry lips and mouth.  Dizziness.  Dark urine.  Decreasing urine frequency and amount.  Confusion.  Rapid breathing or pulse. SEEK IMMEDIATE MEDICAL CARE IF:   You have blood or brown flecks (like coffee grounds) in your vomit.  You have black or bloody stools.  You have a severe headache or stiff neck.  You are confused.  You have severe abdominal pain.  You have chest pain or trouble breathing.  You do not urinate at least once every 8 hours.  You develop cold or clammy skin.  You continue to vomit for longer than 24 to 48 hours.  You have a fever. MAKE SURE YOU:   Understand these instructions.  Will watch your  condition.  Will get help right away if you are not doing well or get worse. Document Released: 03/07/2005 Document Revised: 05/30/2011 Document Reviewed: 08/04/2010 [facility] Patient Information 2015 [place], [place]. This information is not intended to replace advice given to you by your health care provider. Make sure you discuss any questions you have with your health care provider.  Chest Pain (Nonspecific) It is often hard to give a specific diagnosis for the cause of chest pain. There is always a chance that your pain could be related to something serious, such as a heart attack or a blood clot in the lungs. You need to follow up with your health care provider for further evaluation. CAUSES   Heartburn.  Pneumonia or bronchitis.  Anxiety or stress.  Inflammation around your heart (pericarditis) or lung (pleuritis or pleurisy).  A blood clot in the lung.  A collapsed lung (pneumothorax). It can develop suddenly on its own (spontaneous pneumothorax) or from trauma to the chest.  Shingles infection (herpes zoster virus). The chest wall is composed of bones, muscles, and cartilage. Any of these can be the source of the pain.  The bones can be bruised by injury.  The muscles or cartilage can be strained by coughing or overwork.  The cartilage can be affected by inflammation and become sore (costochondritis). DIAGNOSIS  Lab tests or other studies may be needed to find the cause of your pain. Your health care provider may have you take a test called an ambulatory electrocardiogram (ECG). An ECG records your heartbeat patterns over a 24-hour period. You may also have other tests, such as:  Transthoracic echocardiogram (TTE). During echocardiography, sound waves are used to evaluate how blood flows through your heart.  Transesophageal echocardiogram (TEE).  Cardiac monitoring. This allows your health care provider to monitor your heart rate and rhythm in real time.  Holter monitor.  This is a portable device that records your heartbeat and can help diagnose heart arrhythmias. It allows your health care provider to track your heart activity for several days, if needed.  Stress tests by exercise or by giving medicine that makes the heart beat faster. TREATMENT   Treatment depends on what may be causing your chest pain. Treatment may include:  Acid blockers for heartburn.  Anti-inflammatory medicine.  Pain medicine for inflammatory conditions.  Antibiotics if an infection is present.  You may be advised to change lifestyle habits. This includes stopping smoking and avoiding alcohol, caffeine, and chocolate.  You may be advised to keep your head raised (elevated) when sleeping. This reduces the chance of acid going backward from your stomach into your esophagus. Most of the time, nonspecific chest pain will improve within 2-3 days with rest and mild pain medicine.  HOME CARE INSTRUCTIONS   If antibiotics were prescribed, take them as directed. Finish them even if you start to feel better.  For the next few days, avoid physical activities that bring on chest pain. Continue physical activities as directed.  Do not use any tobacco products, including cigarettes, chewing tobacco, or electronic cigarettes.  Avoid drinking alcohol.  Only take medicine as directed by your health care provider.  Follow your health care provider's suggestions for further testing if your chest pain does not go away.  Keep any follow-up appointments you made. If you do not go to an appointment, you could develop lasting (chronic) problems with pain. If there is any problem keeping an appointment, call to reschedule. SEEK MEDICAL CARE IF:   Your chest pain does not go away, even after treatment.  You have a rash with blisters on your chest.  You have a fever. SEEK IMMEDIATE MEDICAL CARE IF:   You have increased chest pain or pain that spreads to your arm, neck, jaw, back, or  abdomen.  You have shortness of breath.  You have an increasing cough, or you cough up blood.  You have severe back or abdominal pain.  You feel nauseous or vomit.  You have severe weakness.  You faint.  You have chills. This is an emergency. Do not wait to see if the pain will go away. Get medical help at once. Call your local emergency services (911 in [place] Do not drive yourself to the hospital. MAKE SURE YOU:   Understand these instructions.  Will watch your condition.  Will get help right away if you are not doing well or get worse. Document Released: 12/15/2004 Document Revised: 03/12/2013 Document Reviewed: 10/11/2007 [facility] Patient Information 2015 [place], [place]. This information is not intended to replace advice given to you by your health care provider. Make sure you discuss any questions you have with your health care provider.

## 2013-09-27 NOTE — ED Provider Notes (Signed)
CSN: [id number]     Arrival date & time 09/27/13  6761 History   First MD Initiated Contact with Patient 09/27/13 1133     Chief Complaint  Patient presents with  . Nausea   HPI  [name] is a 63 y.o. female with a PMH of PAF, HTN, CAD s/p stent, hx of TIA, hyperlipidemia, MR, ischemic cardiomyopathy, s/p ICD, hx of anticoagulation (Eliquis), CHF, anemia, CKD, LV mural thrombus, DJD, ischemic colitis, spinal stenosis, diverticulitis, and GERD who presents to the ED for evaluation of nausea. History was provided by the patient. Patient states that she has had nausea, vomiting and abdominal pain for the past week. She went to see her GI specialist Dr. Michail Sermon yesterday and was scheduled for a CT scan. She was drinking the contrast today and was unable to get the scan due to continuous vomiting. Patient sent to the ED for further evaluation. Patient states she has had diarrhea for the past 3 days with her last BM yesterday. No hematochezia. Her abdominal pain is located in her lower abdomen diffusely (R>L). Her pain is worse with palpation and is described as a sharp pain. She has not taken anything for pain. States she has a hx of partial bowel obstruction and colostomy from ruptured diverticulitis. Since then she has had a colostomy reversal. She denies any dysuria, difficulty with urination, fever, chills, lightheadedness, dizziness, or headache. Previous abdominal surgeries include appendectomy and colectomy. She also complains of intermittent mid-sternal chest pain without radiation x 2 days. She states "I think it is from vomiting." She states it is a sharp pain which is worse with vomiting. Denies any SOB or cough. Not similar to her chest pain in the past, which is usually left sided. Her cardiologist is with [facility]   Past Medical History  Diagnosis Date  . Spinal stenosis   . Numbness of foot   . Diverticulitis     a. s/p partial colectomy 1/12 with reversal in July 2012.  . Colitis,  ischemic   . DJD (degenerative joint disease)   . Ischemic cardiomyopathy     a. Echo 06/16/10: EF 25-30%, anteroseptal and apical hypokinesis, moderate AI, mild MR. b.   . LV (left ventricular) mural thrombus   . CKD (chronic kidney disease), stage III   . GERD (gastroesophageal reflux disease)   . Hypertension   . Hypothyroidism   . Hyperlipidemia   . Compression fracture     a. of L2  . 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 2/2 atrial fibrillation with RVR in the context of hypokalemia  . PAF (paroxysmal atrial fibrillation)     a. on Eliquis, digoxin, and amiodarone  . Anemia   . H/O hiatal hernia   . Sinus bradycardia   . Mitral regurgitation   . CHF (congestive heart failure)   . CAD (coronary artery disease)     a.  Ant MI 8/03 with stenting to LAD;  b. staged PCI w/ DES to OM1 8/03;  c. s/p DES to LAD 7/04;  d.  multiple caths in past (chronically abnl ECG); e. cardiac cath 3/12: LAD stent patent, distal LAD 40-50%, small ostial D1 80%, ostial D2 60%, OM-1 stent patent (20-30%), proximal RCA 50%, prox- mRCA 40-50%; f. cath 2012 nonobs g. cath 06/2013 s/p STEMI- microvascular dz- treated medically   . History of TIA (transient ischemic attack)   . Automatic implantable cardioverter-defibrillator in situ 08/05/2013    PACEMAKER /ICD  DR Caryl Comes  . Atrial fibrillation   . Long term (current) use of anticoagulants    Past Surgical History  Procedure Laterality Date  . Back surgery    . Cardiac defibrillator placement  07/2003; 10/2004; 2014  . Lumbar laminectomy/decompression microdiscectomy  10/2001    L5-S1/E-chart  . Knee arthroscopy Right   . Thyroidectomy  1970's  . Total knee arthroplasty Left 11/2003  . X-stop implantation  12/2004    L3-4; L4-5  . Fixation kyphoplasty thoracic spine  07/2007    T3, 4, 6 compression fractures  . Shoulder open rotator cuff repair Right 04/2008  . Lumbar laminectomy/decompression microdiscectomy   01/2010  . Colectomy  03/2010    sigmoid left; transverse  . Peripherally inserted central catheter insertion  03/2010    removed upon discharge  . Colostomy reversal  12/2010  . Tonsillectomy and adenoidectomy  1969  . Appendectomy  03/2010  . Glaucoma surgery Right   . Cardiac defibrillator removal  12/17/2012    removed for infection  . Icd lead removal Left 12/17/2012    Procedure: ICD LEAD REMOVAL;  Surgeon: Evans Lance, MD;  Location: [facility];  Service: Cardiovascular;  Laterality: Left;  . Coronary angioplasty with stent placement  10/2001; 09/2002; ?date    "I've got a total of 4" (04/10/2013)  . Bi-ventricular implantable cardioverter defibrillator  (crt-d)  08/05/2013    STJ Quadra Assura CRTD implanted by Dr Caryl Comes (right sided)   Family History  Problem Relation Age of Onset  . Diabetes Mother   . Diabetes Brother   . Arthritis      family history  . Prostate cancer      Family History   History  Substance Use Topics  . Smoking status: Former Smoker -- 0.10 packs/day for 33 years    Types: Cigarettes    Quit date: 04/21/2001  . Smokeless tobacco: Never Used     Comment: "smoked ~ 1 pack/month"  . Alcohol Use: 0.6 oz/week    1 Glasses of wine per week   OB History   Grav Para Term Preterm Abortions TAB SAB Ect Mult Living                 Review of Systems  Constitutional: Negative for fever, chills, activity change, appetite change and fatigue.  Respiratory: Negative for cough, shortness of breath and wheezing.   Cardiovascular: Positive for chest pain. Negative for palpitations and leg swelling.  Gastrointestinal: Positive for nausea, vomiting, abdominal pain and diarrhea. Negative for constipation, blood in stool, anal bleeding and rectal pain.  Genitourinary: Negative for dysuria and difficulty urinating.  Musculoskeletal: Negative for back pain and myalgias.  Skin: Negative for wound.  Neurological: Negative for dizziness, weakness, light-headedness and  headaches.    Allergies  Coconut oil; Lansoprazole; Penicillins; Statins; Lisinopril; and Lactose intolerance (gi)  Home Medications   Prior to Admission medications   Medication Sig Start Date End Date Taking? Authorizing Provider  amiodarone (PACERONE) 200 MG tablet Take 200 mg by mouth daily.    Historical Provider, MD  apixaban (ELIQUIS) 5 MG TABS tablet Take 1 tablet (5 mg total) by mouth 2 (two) times daily. 09/02/13   Jolaine Artist, MD  clonazePAM (KLONOPIN) 0.5 MG tablet Take 1 tablet (0.5 mg total) by mouth 2 (two) times daily as needed for anxiety. 09/16/13   Janith Lima, MD  digoxin (LANOXIN) 0.125 MG tablet Take 0.5 tablets (0.0625 mg total) by mouth daily. 06/18/13   Deatra Canter  Philis Pique, NP  fexofenadine (ALLEGRA) 180 MG tablet Take 180 mg by mouth daily as needed for allergies.     Historical Provider, MD  fluticasone (FLONASE) 50 MCG/ACT nasal spray Place 2 sprays into both nostrils daily as needed for allergies.     Historical Provider, MD  folic acid-pyridoxine-cyancobalamin (FOLTX) 2.5-25-2 MG TABS Take 1 tablet by mouth daily.     Historical Provider, MD  furosemide (LASIX) 40 MG tablet Take 1 tablet (40 mg total) by mouth daily. 06/11/13   Amy D Ninfa Meeker, NP  HYDROcodone-acetaminophen (NORCO/VICODIN) 5-325 MG per tablet Take 1 tablet by mouth every 6 (six) hours as needed for moderate pain.    Historical Provider, MD  hyoscyamine (LEVBID) 0.375 MG 12 hr tablet Take 0.375 mg by mouth every 12 (twelve) hours as needed for cramping.    Historical Provider, MD  levothyroxine (SYNTHROID, LEVOTHROID) 200 MCG tablet Take 200 mcg by mouth daily before breakfast.     Historical Provider, MD  losartan (COZAAR) 25 MG tablet Take 1 tablet (25 mg total) by mouth daily. 08/21/13   Rande Brunt, NP  nitroGLYCERIN (NITROSTAT) 0.4 MG SL tablet Place 0.4 mg under the tongue every 5 (five) minutes as needed for chest pain.     Historical Provider, MD  omega-3 acid ethyl esters (LOVAZA) 1 G  capsule Take 1 g by mouth daily.    Historical Provider, MD  pantoprazole (PROTONIX) 40 MG tablet Take 1 tablet (40 mg total) by mouth 2 (two) times daily. 07/12/13   Janith Lima, MD  polyethylene glycol [facility] / Floria Raveling) packet Take 17 g by mouth daily as needed (for constipation).     Historical Provider, MD  raloxifene (EVISTA) 60 MG tablet Take 60 mg by mouth daily.    Historical Provider, MD  spironolactone (ALDACTONE) 25 MG tablet Take 25 mg by mouth daily. 08/07/13   Dayna N Dunn, PA-C   BP 119/64  Pulse 73  Temp(Src) 99.1 F (37.3 C) (Oral)  Resp 16  SpO2 100%  Filed Vitals:   09/27/13 1900 09/27/13 1915 09/27/13 2000 09/27/13 2039  BP: 115/50 127/66 131/59   Pulse: 70 70 71   Temp:    98.1 F (36.7 C)  TempSrc:    Oral  Resp: 16 16 22    SpO2: 100% 100% 100%     Physical Exam  Nursing note and vitals reviewed. Constitutional: She is oriented to person, place, and time. She appears well-developed and well-nourished. No distress.  HENT:  Head: Normocephalic and atraumatic.  Right Ear: External ear normal.  Left Ear: External ear normal.  Nose: Nose normal.  Mouth/Throat: Oropharynx is clear and moist.  Eyes: Conjunctivae are normal. Right eye exhibits no discharge. Left eye exhibits no discharge.  Neck: Normal range of motion. Neck supple.  Cardiovascular: Normal rate, regular rhythm and intact distal pulses.  Exam reveals no gallop and no friction rub.   Murmur heard. Grade 2 holosystolic murmur  Pulmonary/Chest: Effort normal and breath sounds normal. No respiratory distress. She has no wheezes. She has no rales. She exhibits no tenderness.  Abdominal: Soft. She exhibits no distension and no mass. There is tenderness. There is no rebound and no guarding.  Diffuse tenderness to palpation to the lower abdomen (R>L).   Musculoskeletal: Normal range of motion. She exhibits no edema and no tenderness.  Neurological: She is alert and oriented to person, place, and  time.  Skin: Skin is warm and dry. She is not diaphoretic.  ED Course  Procedures (including critical care time) Labs Review Labs Reviewed  CBC WITH DIFFERENTIAL  COMPREHENSIVE METABOLIC PANEL  URINALYSIS, ROUTINE W REFLEX MICROSCOPIC  LIPASE, BLOOD    Imaging Review Dg Chest 2 View  09/27/2013   CLINICAL DATA:  Shortness of breath and weakness.  EXAM: CHEST  2 VIEW  COMPARISON:  Two-view chest 08/06/2013  FINDINGS: Heart size is normal. Pacing and defibrillator wires are stable. The lungs are clear. No edema or effusion is present. Emphysematous changes are again noted. Spinal augmentation in the upper thoracic spine is stable. Postsurgical changes are again noted in the lumbar spine. A remote fracture just above the lumbar hardware is stable.  IMPRESSION: 1. No acute cardiopulmonary disease. 2. Stable emphysematous changes. 3. Atherosclerosis. 4. Postsurgical changes of the spine.   Electronically Signed   By: Lawrence Santiago M.D.   On: 09/27/2013 12:57   Ct Abdomen Pelvis W Contrast  09/27/2013   CLINICAL DATA:  Vomiting and abdominal pain for 1 week. Occasional diarrhea.  EXAM: CT ABDOMEN AND PELVIS WITH CONTRAST  TECHNIQUE: Multidetector CT imaging of the abdomen and pelvis was performed using the standard protocol following bolus administration of intravenous contrast.  CONTRAST:  100 mL OMNIPAQUE IOHEXOL 300 MG/ML  SOLN  COMPARISON:  CT abdomen and pelvis 02/28/2012.  FINDINGS: There is some linear scar in the left lung base, unchanged. Dependent atelectasis is also noted. No pleural or pericardial effusion. There is mild cardiomegaly.  There is some high attenuation material the gallbladder consistent with sludge. No evidence of inflammatory change is identified. The liver, spleen, adrenal glands, kidneys, pancreas and biliary tree are unremarkable. Uterus and adnexa are unremarkable. The patient is status post subtotal colectomy without evidence of complication. The stomach and small  bowel appear normal. No lymphadenopathy or fluid collection is identified.  The patient is status post L3-S1 fusion. Remote L1 and L2 compression fractures are identified. There is also a compression fracture of T12 which is new since the comparison CT scan but appears remote. No worrisome bony lesion is identified.  IMPRESSION: No acute finding or finding to explain the patient's symptoms.  Status post subtotal colectomy.  Gallbladder sludge without evidence of inflammatory change.  Remote appearing T11 compression fracture is new since the comparison 2013 CT scan. L1 and L2 compression fractures are unchanged.   Electronically Signed   By: Inge Rise M.D.   On: 09/27/2013 15:43     EKG Interpretation   Date/Time:  Friday September 27 2013 12:34:48 EDT Ventricular Rate:  70 PR Interval:  199 QRS Duration: 101 QT Interval:  529 QTC Calculation: 571 R Axis:   -41 Text Interpretation:  Sinus or ectopic atrial rhythm Probable left atrial  enlargement Inferior infarct, acute (RCA) Anterior infarct, old Prolonged  QT interval Probable RV involvement, suggest recording right precordial  leads Confirmed by DELOS  MD, DOUGLAS ([id number]) on 09/27/2013 12:42:00 PM      Results for orders placed during the hospital encounter of 09/27/13  CBC WITH DIFFERENTIAL      Result Value Ref Range   WBC 5.2  4.0 - 10.5 K/uL   RBC 3.91  3.87 - 5.11 MIL/uL   Hemoglobin 13.3  12.0 - 15.0 g/dL   HCT 38.9  36.0 - 46.0 %   MCV 99.5  78.0 - 100.0 fL   MCH 34.0  26.0 - 34.0 pg   MCHC 34.2  30.0 - 36.0 g/dL   RDW 16.3 (*) 11.5 - 15.5 %  Platelets 153  150 - 400 K/uL   Neutrophils Relative % 62  43 - 77 %   Neutro Abs 3.2  1.7 - 7.7 K/uL   Lymphocytes Relative 22  12 - 46 %   Lymphs Abs 1.2  0.7 - 4.0 K/uL   Monocytes Relative 16 (*) 3 - 12 %   Monocytes Absolute 0.8  0.1 - 1.0 K/uL   Eosinophils Relative 0  0 - 5 %   Eosinophils Absolute 0.0  0.0 - 0.7 K/uL   Basophils Relative 0  0 - 1 %   Basophils  Absolute 0.0  0.0 - 0.1 K/uL  COMPREHENSIVE METABOLIC PANEL      Result Value Ref Range   Sodium 138  137 - 147 mEq/L   Potassium 5.7 (*) 3.7 - 5.3 mEq/L   Chloride 97  96 - 112 mEq/L   CO2 22  19 - 32 mEq/L   Glucose, Bld 89  70 - 99 mg/dL   BUN 8  6 - 23 mg/dL   Creatinine, Ser 0.80  0.50 - 1.10 mg/dL   Calcium 8.5  8.4 - 10.5 mg/dL   Total Protein 7.4  6.0 - 8.3 g/dL   Albumin 3.3 (*) 3.5 - 5.2 g/dL   AST 80 (*) 0 - 37 U/L   ALT 33  0 - 35 U/L   Alkaline Phosphatase 42  39 - 117 U/L   Total Bilirubin 0.9  0.3 - 1.2 mg/dL   GFR calc non Af Amer 77 (*) >90 mL/min   GFR calc Af Amer 89 (*) >90 mL/min   Anion gap 19 (*) 5 - 15  URINALYSIS, ROUTINE W REFLEX MICROSCOPIC      Result Value Ref Range   Color, Urine AMBER (*) YELLOW   APPearance HAZY (*) CLEAR   Specific Gravity, Urine 1.016  1.005 - 1.030   pH 6.5  5.0 - 8.0   Glucose, UA NEGATIVE  NEGATIVE mg/dL   Hgb urine dipstick NEGATIVE  NEGATIVE   Bilirubin Urine MODERATE (*) NEGATIVE   Ketones, ur 15 (*) NEGATIVE mg/dL   Protein, ur NEGATIVE  NEGATIVE mg/dL   Urobilinogen, UA 1.0  0.0 - 1.0 mg/dL   Nitrite NEGATIVE  NEGATIVE   Leukocytes, UA SMALL (*) NEGATIVE  LIPASE, BLOOD      Result Value Ref Range   Lipase 20  11 - 59 U/L  TROPONIN I      Result Value Ref Range   Troponin I <0.30  <0.30 ng/mL  LACTIC ACID, PLASMA      Result Value Ref Range   Lactic Acid, Venous 1.3  0.5 - 2.2 mmol/L  URINE MICROSCOPIC-ADD ON      Result Value Ref Range   Squamous Epithelial / LPF FEW (*) RARE   WBC, UA 3-6  <3 WBC/hpf   Bacteria, UA FEW (*) RARE   Casts HYALINE CASTS (*) NEGATIVE   Urine-Other MUCOUS PRESENT       MDM   [name] is a 63 y.o. female with a PMH of PAF, HTN, CAD s/p stent, hx of TIA, hyperlipidemia, MR, ischemic cardiomyopathy, s/p ICD, hx of anticoagulation (Eliquis), CHF, anemia, CKD, LV mural thrombus, DJD, ischemic colitis, spinal stenosis, diverticulitis, and GERD who presents to the ED for  evaluation of nausea. Cause of her continuous nausea and vomiting is unclear. CT abdomen and pelvis negative for any acute findings. Incidental findings include gallbladder sludge, which is unlikely the cause of her pain. Also  a remote compression fx. Patient denies any recent falls or change in her chronic back pain. Labs revealed hyperkalemia (5.7) which is likely due to hemolysis. Patient afebrile and non-toxic in appearance. Patient had elevated anion gap with sodium, chloride, and CO2 WNL. No hyperglycemia. No DKA. Patient also complained of intermittent mid-sternal chest pain for the past two days on ROS. Possibly musculoskeletal from vomiting. EKG negative for any acute ischemic changes. Troponin negative. Chest pain resolved throughout her ED visit. Chest x-ray negative for an acute cardiopulmonary process. Patient has follow-up appointment with her GI specialist. She has plans to get an EGD to further evaluate her nausea and vomiting. Has Zofran at home. Patient able to tolerate fluid challenge before discharge. Return precautions, discharge instructions, and follow-up was discussed with the patient before discharge.     Rechecks  8:30 PM = Patient able to keep down chicken broth without vomiting. No abdominal or chest pain. States she is ready for discharge. Has zofran at home.     Discharge Medication List as of 09/27/2013  8:49 PM       Final impressions: 1. Nausea vomiting and diarrhea   2. Abdominal pain, unspecified abdominal location   3. Chest pain, unspecified chest pain type       Mercy Moore PA-C   This patient was discussed with Dr. Margarita Sermons, PA-C 09/28/13 1356

## 2013-09-27 NOTE — ED Notes (Signed)
CT notified of IV placement and contrast finished.

## 2013-09-28 LAB — URINE CULTURE: Colony Count: 45000

## 2013-09-28 NOTE — ED Provider Notes (Signed)
Medical screening examination/treatment/procedure(s) were performed by non-physician practitioner and as supervising physician I was immediately available for consultation/collaboration.   EKG Interpretation   Date/Time:  Friday September 27 2013 12:34:48 EDT Ventricular Rate:  70 PR Interval:  199 QRS Duration: 101 QT Interval:  529 QTC Calculation: 571 R Axis:   -41 Text Interpretation:  Sinus or ectopic atrial rhythm Probable left atrial  enlargement Inferior infarct, acute (RCA) Anterior infarct, old Prolonged  QT interval Probable RV involvement, suggest recording right precordial  leads Confirmed by DELOS  MD, Topacio Cella ([id number]) on 09/27/2013 12:42:00 PM       Veryl Speak, MD 09/28/13 1911

## 2013-09-29 ENCOUNTER — Emergency Department (HOSPITAL_COMMUNITY): Payer: Medicare Other

## 2013-09-29 ENCOUNTER — Encounter (HOSPITAL_COMMUNITY): Payer: Self-pay | Admitting: Emergency Medicine

## 2013-09-29 ENCOUNTER — Observation Stay (HOSPITAL_COMMUNITY)
Admission: EM | Admit: 2013-09-29 | Discharge: 2013-10-03 | Disposition: A | Discharge status: Home / Self Care | Payer: Medicare Other | Attending: Internal Medicine | Admitting: Internal Medicine

## 2013-09-29 DIAGNOSIS — I2510 Atherosclerotic heart disease of native coronary artery without angina pectoris: Secondary | ICD-10-CM | POA: Diagnosis not present

## 2013-09-29 DIAGNOSIS — Z96659 Presence of unspecified artificial knee joint: Secondary | ICD-10-CM | POA: Insufficient documentation

## 2013-09-29 DIAGNOSIS — K219 Gastro-esophageal reflux disease without esophagitis: Secondary | ICD-10-CM | POA: Diagnosis not present

## 2013-09-29 DIAGNOSIS — K2960 Other gastritis without bleeding: Secondary | ICD-10-CM | POA: Insufficient documentation

## 2013-09-29 DIAGNOSIS — I272 Pulmonary hypertension, unspecified: Secondary | ICD-10-CM | POA: Diagnosis present

## 2013-09-29 DIAGNOSIS — Z7901 Long term (current) use of anticoagulants: Secondary | ICD-10-CM | POA: Insufficient documentation

## 2013-09-29 DIAGNOSIS — I4891 Unspecified atrial fibrillation: Secondary | ICD-10-CM | POA: Diagnosis present

## 2013-09-29 DIAGNOSIS — I471 Supraventricular tachycardia, unspecified: Principal | ICD-10-CM | POA: Insufficient documentation

## 2013-09-29 DIAGNOSIS — Z95810 Presence of automatic (implantable) cardiac defibrillator: Secondary | ICD-10-CM | POA: Diagnosis present

## 2013-09-29 DIAGNOSIS — E785 Hyperlipidemia, unspecified: Secondary | ICD-10-CM | POA: Diagnosis not present

## 2013-09-29 DIAGNOSIS — Z79899 Other long term (current) drug therapy: Secondary | ICD-10-CM | POA: Insufficient documentation

## 2013-09-29 DIAGNOSIS — N183 Chronic kidney disease, stage 3 unspecified: Secondary | ICD-10-CM | POA: Diagnosis not present

## 2013-09-29 DIAGNOSIS — I509 Heart failure, unspecified: Secondary | ICD-10-CM | POA: Diagnosis not present

## 2013-09-29 DIAGNOSIS — R112 Nausea with vomiting, unspecified: Secondary | ICD-10-CM | POA: Diagnosis present

## 2013-09-29 DIAGNOSIS — I2789 Other specified pulmonary heart diseases: Secondary | ICD-10-CM | POA: Diagnosis not present

## 2013-09-29 DIAGNOSIS — I5022 Chronic systolic (congestive) heart failure: Secondary | ICD-10-CM | POA: Diagnosis not present

## 2013-09-29 DIAGNOSIS — E039 Hypothyroidism, unspecified: Secondary | ICD-10-CM | POA: Insufficient documentation

## 2013-09-29 DIAGNOSIS — R1013 Epigastric pain: Secondary | ICD-10-CM | POA: Diagnosis present

## 2013-09-29 DIAGNOSIS — Z87891 Personal history of nicotine dependence: Secondary | ICD-10-CM | POA: Insufficient documentation

## 2013-09-29 DIAGNOSIS — I252 Old myocardial infarction: Secondary | ICD-10-CM | POA: Diagnosis not present

## 2013-09-29 DIAGNOSIS — R079 Chest pain, unspecified: Secondary | ICD-10-CM

## 2013-09-29 DIAGNOSIS — E876 Hypokalemia: Secondary | ICD-10-CM | POA: Insufficient documentation

## 2013-09-29 DIAGNOSIS — I2589 Other forms of chronic ischemic heart disease: Secondary | ICD-10-CM | POA: Insufficient documentation

## 2013-09-29 DIAGNOSIS — I255 Ischemic cardiomyopathy: Secondary | ICD-10-CM

## 2013-09-29 DIAGNOSIS — Z9861 Coronary angioplasty status: Secondary | ICD-10-CM | POA: Diagnosis not present

## 2013-09-29 DIAGNOSIS — Z8673 Personal history of transient ischemic attack (TIA), and cerebral infarction without residual deficits: Secondary | ICD-10-CM | POA: Diagnosis not present

## 2013-09-29 DIAGNOSIS — I129 Hypertensive chronic kidney disease with stage 1 through stage 4 chronic kidney disease, or unspecified chronic kidney disease: Secondary | ICD-10-CM | POA: Diagnosis not present

## 2013-09-29 DIAGNOSIS — B370 Candidal stomatitis: Secondary | ICD-10-CM | POA: Diagnosis not present

## 2013-09-29 HISTORY — DX: Chronic systolic (congestive) heart failure: I50.22

## 2013-09-29 HISTORY — DX: Candidal stomatitis: B37.0

## 2013-09-29 HISTORY — DX: Other supraventricular tachycardia: I47.19

## 2013-09-29 HISTORY — DX: Supraventricular tachycardia: I47.1

## 2013-09-29 HISTORY — DX: Age-related osteoporosis without current pathological fracture: M81.0

## 2013-09-29 HISTORY — DX: Unspecified chronic gastritis without bleeding: K29.50

## 2013-09-29 LAB — I-STAT CHEM 8, ED
BUN: 4 mg/dL — AB (ref 6–23)
CHLORIDE: 102 meq/L (ref 96–112)
Calcium, Ion: 0.98 mmol/L — ABNORMAL LOW (ref 1.13–1.30)
Creatinine, Ser: 0.9 mg/dL (ref 0.50–1.10)
Glucose, Bld: 94 mg/dL (ref 70–99)
HCT: 45 % (ref 36.0–46.0)
Hemoglobin: 15.3 g/dL — ABNORMAL HIGH (ref 12.0–15.0)
POTASSIUM: 3.2 meq/L — AB (ref 3.7–5.3)
Sodium: 141 mEq/L (ref 137–147)
TCO2: 23 mmol/L (ref 0–100)

## 2013-09-29 LAB — APTT: APTT: 29 s (ref 24–37)

## 2013-09-29 LAB — CBC WITH DIFFERENTIAL/PLATELET
Basophils Absolute: 0 10*3/uL (ref 0.0–0.1)
Basophils Relative: 0 % (ref 0–1)
Eosinophils Absolute: 0 10*3/uL (ref 0.0–0.7)
Eosinophils Relative: 0 % (ref 0–5)
HCT: 38.7 % (ref 36.0–46.0)
HEMOGLOBIN: 13.5 g/dL (ref 12.0–15.0)
LYMPHS PCT: 12 % (ref 12–46)
Lymphs Abs: 0.7 10*3/uL (ref 0.7–4.0)
MCH: 34.2 pg — ABNORMAL HIGH (ref 26.0–34.0)
MCHC: 34.9 g/dL (ref 30.0–36.0)
MCV: 98 fL (ref 78.0–100.0)
MONOS PCT: 14 % — AB (ref 3–12)
Monocytes Absolute: 0.8 10*3/uL (ref 0.1–1.0)
NEUTROS ABS: 4.2 10*3/uL (ref 1.7–7.7)
NEUTROS PCT: 74 % (ref 43–77)
Platelets: 149 10*3/uL — ABNORMAL LOW (ref 150–400)
RBC: 3.95 MIL/uL (ref 3.87–5.11)
RDW: 16.1 % — ABNORMAL HIGH (ref 11.5–15.5)
WBC: 5.7 10*3/uL (ref 4.0–10.5)

## 2013-09-29 LAB — DIGOXIN LEVEL: Digoxin Level: 0.5 ng/mL — ABNORMAL LOW (ref 0.8–2.0)

## 2013-09-29 LAB — I-STAT TROPONIN, ED: TROPONIN I, POC: 0.02 ng/mL (ref 0.00–0.08)

## 2013-09-29 LAB — PROTIME-INR
INR: 1.09 (ref 0.00–1.49)
PROTHROMBIN TIME: 14.1 s (ref 11.6–15.2)

## 2013-09-29 LAB — MAGNESIUM: Magnesium: 1.8 mg/dL (ref 1.5–2.5)

## 2013-09-29 MED ORDER — APIXABAN 5 MG PO TABS
5.0000 mg | ORAL_TABLET | ORAL | Status: AC
  Administered 2013-09-29: 5 mg via ORAL
  Filled 2013-09-29: qty 1

## 2013-09-29 MED ORDER — AMIODARONE HCL 200 MG PO TABS
200.0000 mg | ORAL_TABLET | Freq: Every day | ORAL | Status: DC
  Administered 2013-09-30 – 2013-10-03 (×4): 200 mg via ORAL
  Filled 2013-09-29 (×4): qty 1

## 2013-09-29 MED ORDER — POTASSIUM CHLORIDE CRYS ER 20 MEQ PO TBCR
40.0000 meq | EXTENDED_RELEASE_TABLET | ORAL | Status: AC
  Administered 2013-09-29 (×2): 40 meq via ORAL
  Filled 2013-09-29 (×2): qty 2

## 2013-09-29 MED ORDER — POTASSIUM CHLORIDE CRYS ER 20 MEQ PO TBCR: 40.0000 meq | EXTENDED_RELEASE_TABLET | Freq: Once | ORAL | Status: DC

## 2013-09-29 MED ORDER — FUROSEMIDE 40 MG PO TABS
40.0000 mg | ORAL_TABLET | Freq: Every day | ORAL | Status: DC
  Administered 2013-09-29 – 2013-10-03 (×5): 40 mg via ORAL
  Filled 2013-09-29 (×3): qty 1
  Filled 2013-09-29: qty 2
  Filled 2013-09-29: qty 1

## 2013-09-29 MED ORDER — NITROGLYCERIN 0.4 MG SL SUBL
0.4000 mg | SUBLINGUAL_TABLET | SUBLINGUAL | Status: DC | PRN
  Administered 2013-09-29: 0.4 mg via SUBLINGUAL
  Filled 2013-09-29: qty 1

## 2013-09-29 MED ORDER — AMIODARONE HCL 200 MG PO TABS
200.0000 mg | ORAL_TABLET | ORAL | Status: AC
  Administered 2013-09-29: 200 mg via ORAL
  Filled 2013-09-29: qty 1

## 2013-09-29 MED ORDER — APIXABAN 5 MG PO TABS
5.0000 mg | ORAL_TABLET | Freq: Two times a day (BID) | ORAL | Status: DC
  Administered 2013-09-30 – 2013-10-03 (×7): 5 mg via ORAL
  Filled 2013-09-29 (×9): qty 1

## 2013-09-29 NOTE — ED Provider Notes (Signed)
CSN: [id number]     Arrival date & time 09/29/13  1500 History   First MD Initiated Contact with Patient 09/29/13 1501     Chief Complaint  Patient presents with  . Chest Pain  . Shortness of Breath     (Consider location/radiation/quality/duration/timing/severity/associated sxs/prior Treatment) Patient is a 63 y.o. female presenting with chest pain and shortness of breath. The history is provided by the patient.  Chest Pain Associated symptoms: nausea and shortness of breath   Associated symptoms: no abdominal pain, no back pain, no headache, no numbness, not vomiting and no weakness   Shortness of Breath Associated symptoms: chest pain   Associated symptoms: no abdominal pain, no headaches, no rash and no vomiting    patient presents with chest pain. it is in her epigastric area and goes up to her chest. It began a couple hours ago. She has a history of coronary artery disease but is also had a clean/stable cath 3 weeks ago and 3 months ago. She recently had a AICD placed. She's had some nausea and some belching. No fevers. Mild shortness of breath. She is on amiodarone and digoxin for atrial fibrillation.  Past Medical History  Diagnosis Date  . Spinal stenosis   . Numbness of foot   . Diverticulitis     a. s/p partial colectomy 1/12 with reversal in July 2012.  . Colitis, ischemic   . DJD (degenerative joint disease)   . Ischemic cardiomyopathy     a. Echo 06/16/10: EF 25-30%, anteroseptal and apical hypokinesis, moderate AI, mild MR.   . LV (left ventricular) mural thrombus   . CKD (chronic kidney disease), stage III   . GERD (gastroesophageal reflux disease)   . Hypertension   . Hypothyroidism   . Hyperlipidemia   . Compression fracture     a. of L2  . 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 2/2 atrial fibrillation with RVR in the context of hypokalemia  . PAF (paroxysmal atrial fibrillation)     a. on Eliquis, digoxin, and  amiodarone  . Anemia   . H/O hiatal hernia   . Sinus bradycardia   . Mitral regurgitation   . CHF (congestive heart failure)   . CAD (coronary artery disease)     a.  Ant MI 8/03 with stenting to LAD;  b. staged PCI w/ DES to OM1 8/03;  c. s/p DES to LAD 7/04;  d.  multiple caths in past (chronically abnl ECG); e. cardiac cath 3/12: LAD stent patent, distal LAD 40-50%, small ostial D1 80%, ostial D2 60%, OM-1 stent patent (20-30%), proximal RCA 50%, prox- mRCA 40-50%; f. cath 2012 nonobs g. cath 06/2013 s/p STEMI- microvascular dz- treated medically   . History of TIA (transient ischemic attack)   . 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 s/p ICD implantation in 2005. b. Gen change 2006. 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 c. s/p CRT-D implantation 07/2013.  [name] Long term (current) use of anticoagulants   . Paroxysmal atrial tachycardia   . Paroxysmal atrial flutter   . Stroke    Past Surgical History  Procedure Laterality Date  . Back surgery    . Cardiac defibrillator placement  07/2003; 10/2004; 2014  . Lumbar laminectomy/decompression microdiscectomy  10/2001    L5-S1/E-chart  . Knee arthroscopy Right   . Thyroidectomy  1970's  . Total knee arthroplasty Left 11/2003  . X-stop implantation  12/2004    L3-4; L4-5  . Fixation kyphoplasty thoracic spine  07/2007    T3, 4, 6 compression fractures  . Shoulder open rotator cuff repair Right 04/2008  . Lumbar laminectomy/decompression microdiscectomy  01/2010  . Colectomy  03/2010    sigmoid left; transverse  . Peripherally inserted central catheter insertion  03/2010    removed upon discharge  . Colostomy reversal  12/2010  . Tonsillectomy and adenoidectomy  1969  . Appendectomy  03/2010  . Glaucoma surgery Right   . Cardiac defibrillator removal  12/17/2012    removed for infection  . Icd lead removal Left 12/17/2012    Procedure: ICD LEAD REMOVAL;   Surgeon: Evans Lance, MD;  Location: [facility];  Service: Cardiovascular;  Laterality: Left;  . Coronary angioplasty with stent placement  10/2001; 09/2002; ?date    "I've got a total of 4" (04/10/2013)  . Bi-ventricular implantable cardioverter defibrillator  (crt-d)  08/05/2013    STJ Quadra Assura CRTD implanted by Dr Caryl Comes (right sided)   Family History  Problem Relation Age of Onset  . Diabetes Mother   . Diabetes Brother   . Arthritis      family history  . Prostate cancer      Family History   History  Substance Use Topics  . Smoking status: Former Smoker -- 0.10 packs/day for 33 years    Types: Cigarettes    Quit date: 04/21/2001  . Smokeless tobacco: Never Used     Comment: "smoked ~ 1 pack/month"  . Alcohol Use: 0.6 oz/week    1 Glasses of wine per week   OB History   Grav Para Term Preterm Abortions TAB SAB Ect Mult Living                 Review of Systems  Constitutional: Negative for activity change and appetite change.  Eyes: Negative for pain.  Respiratory: Positive for shortness of breath. Negative for chest tightness.   Cardiovascular: Positive for chest pain. Negative for leg swelling.  Gastrointestinal: Positive for nausea. Negative for vomiting, abdominal pain and diarrhea.  Genitourinary: Negative for flank pain.  Musculoskeletal: Negative for back pain and neck stiffness.  Skin: Negative for rash.  Neurological: Negative for weakness, numbness and headaches.  Psychiatric/Behavioral: Negative for behavioral problems.      Allergies  Coconut oil; Lansoprazole; Penicillins; Statins; Lisinopril; and Lactose intolerance (gi)  Home Medications   Prior to Admission medications   Medication Sig Start Date End Date Taking? Authorizing Provider  amiodarone (PACERONE) 200 MG tablet Take 200 mg by mouth daily.   Yes Historical Provider, MD  apixaban (ELIQUIS) 5 MG TABS tablet Take 1 tablet (5 mg total) by mouth 2 (two) times daily. 09/02/13  Yes Jolaine Artist, MD  clonazePAM (KLONOPIN) 0.5 MG tablet Take 1 tablet (0.5 mg total) by mouth 2 (two) times daily as needed for anxiety. 09/16/13  Yes Janith Lima, MD  digoxin (LANOXIN) 0.125 MG tablet Take 0.5 tablets (0.0625 mg total) by mouth daily. 06/18/13  Yes Rande Brunt, NP  fexofenadine (ALLEGRA) 180 MG tablet Take 180 mg by mouth daily as needed for allergies.    Yes Historical Provider, MD  fluticasone (FLONASE) 50 MCG/ACT nasal spray Place 2 sprays into both nostrils daily as needed for allergies.    Yes Historical Provider, MD  folic acid-pyridoxine-cyancobalamin (FOLTX) 2.5-25-2 MG TABS Take 1 tablet by mouth daily.    Yes Historical Provider, MD  furosemide (LASIX) 40 MG  tablet Take 1 tablet (40 mg total) by mouth daily. 06/11/13  Yes Amy D Clegg, NP  levothyroxine (SYNTHROID, LEVOTHROID) 200 MCG tablet Take 200 mcg by mouth daily before breakfast.    Yes Historical Provider, MD  losartan (COZAAR) 25 MG tablet Take 1 tablet (25 mg total) by mouth daily. 08/21/13  Yes Rande Brunt, NP  nitroGLYCERIN (NITROSTAT) 0.4 MG SL tablet Place 0.4 mg under the tongue every 5 (five) minutes as needed for chest pain.    Yes Historical Provider, MD  omega-3 acid ethyl esters (LOVAZA) 1 G capsule Take 1 g by mouth daily.   Yes Historical Provider, MD  ondansetron (ZOFRAN-ODT) 4 MG disintegrating tablet Take 4 mg by mouth 2 (two) times daily as needed for nausea or vomiting.   Yes Historical Provider, MD  pantoprazole (PROTONIX) 40 MG tablet Take 1 tablet (40 mg total) by mouth 2 (two) times daily. 07/12/13  Yes Janith Lima, MD  polyethylene glycol [facility] / GLYCOLAX) packet Take 17 g by mouth daily as needed (for constipation).    Yes Historical Provider, MD  raloxifene (EVISTA) 60 MG tablet Take 60 mg by mouth daily.   Yes Historical Provider, MD  spironolactone (ALDACTONE) 25 MG tablet Take 25 mg by mouth daily. 08/07/13  Yes Dayna N Dunn, PA-C   BP 120/54  Pulse 73  Temp(Src) 98.7 F (37.1 C)  (Oral)  Resp 8  SpO2 99% Physical Exam  Nursing note and vitals reviewed. Constitutional: She is oriented to person, place, and time. She appears well-developed and well-nourished.  HENT:  Head: Normocephalic and atraumatic.  Eyes: EOM are normal. Pupils are equal, round, and reactive to light.  Neck: Normal range of motion. Neck supple. No JVD present.  Cardiovascular: Normal rate, regular rhythm and normal heart sounds.   No murmur heard. Pulmonary/Chest: Effort normal and breath sounds normal. No respiratory distress. She has no wheezes. She has no rales.  AICD to right upper chest wall  Abdominal: Soft. Bowel sounds are normal. She exhibits no distension. There is tenderness. There is no rebound and no guarding.  Mild epigastric tenderness without rebound or guarding.  Musculoskeletal: Normal range of motion.  Neurological: She is alert and oriented to person, place, and time. No cranial nerve deficit.  Skin: Skin is warm and dry.  Psychiatric: She has a normal mood and affect. Her speech is normal.    ED Course  Procedures (including critical care time) Labs Review Labs Reviewed  CBC WITH DIFFERENTIAL - Abnormal; Notable for the following:    MCH 34.2 (*)    RDW 16.1 (*)    Platelets 149 (*)    Monocytes Relative 14 (*)    All other components within normal limits  DIGOXIN LEVEL - Abnormal; Notable for the following:    Digoxin Level 0.5 (*)    All other components within normal limits  I-STAT CHEM 8, ED - Abnormal; Notable for the following:    Potassium 3.2 (*)    BUN 4 (*)    Calcium, Ion 0.98 (*)    Hemoglobin 15.3 (*)    All other components within normal limits  PROTIME-INR  APTT  MAGNESIUM  I-STAT TROPOININ, ED    Imaging Review Dg Chest Port 1 View  09/29/2013   CLINICAL DATA:  Chest pain and shortness of breath.  EXAM: PORTABLE CHEST - 1 VIEW  COMPARISON:  09/27/2013  FINDINGS: Right chest wall pacemaker appears stable. Mild cardiomegaly is stable.  Coronary artery stents noted.  Atherosclerotic calcification of the thoracic aortic arch. Normal pulmonary vascularity. Lungs are well expanded clear.  Vertebroplasty changes at 3 levels in the upper thoracic spine appears stable. Partially imaged postsurgical changes of the lumbar spine.  IMPRESSION: Stable mild cardiomegaly.  No acute findings.  The lungs are clear.  Vertebroplasty changes at 3 levels in the thoracic spine.   Electronically Signed   By: Curlene Dolphin M.D.   On: 09/29/2013 15:48     EKG Interpretation   Date/Time:  Sunday September 29 2013 15:05:53 EDT Ventricular Rate:  175 PR Interval:  152 QRS Duration: 78 QT Interval:  321 QTC Calculation: 548 R Axis:   -173 Text Interpretation:  Ventricular tachycardia (ventricular or  supraventricular with aberration)  also afib Right ventricular hypertrophy  Inferolateral infarct, acute Baseline wander in lead(s) III Confirmed by  Gilmar Bua  MD, Ovid Curd [phone]) on 09/29/2013 4:06:50 PM      MDM   Final diagnoses:  Atrial fibrillation with RVR  Chest pain, unspecified chest pain type    Patient with chest pain. She's had 2 reassuring cath is in the last 3 months. EKG shows some ST elevation, but this is the same pattern she has had in the past with a negative cath. Also had an episode of tachycardia that his wide-complex. It is most likely an atrial fibrillation/SVT with aberrancy. Less likely ventricular tachycardia. AICD has been interrogated. Will be admitted to cardiology    Jasper Riling. Alvino Chapel, MD 09/29/13 2032

## 2013-09-29 NOTE — ED Notes (Addendum)
Pt came in via [facility] with original complaints of indigestion to lower epigastric area and moving up.  Pt has 4 cardiac stents, pt goes in and out of NSR and Afib.  Pt has a defibrillator and is burping.  Pt went from rate of 130s to 180s with wide complex tachycardia.  Dr. Alvino Chapel at bedside.  Pt has had 324mg  of basa, 4mg  zofran, and 2 nitro.

## 2013-09-29 NOTE — H&P (Addendum)
History and Physical  Patient ID: [name] MRN: [id number], DOB: January 12, 1951 Date of Encounter: 09/29/2013, 4:43 PM Primary Physician: [name], MD Primary Cardiologist: Tamala Julian; Gre[place]  Chief Complaint: epigastric pain, nausea and diarrhea Reason for Admission: atrial arrhythmias, WCT, CP, abnormal EKG  HPI: Ms. Tenenbaum is a 63 y.o. female with a history of CAD (stents to mLAD & staged PCI w/ DES to Watson 2003, inferior ST elevation on EKG 06/2013 & 08/2013), ICM EF 20-25% (s/p ICD placement with infection 11/2012 s/p extraction, then reimplantation 04/2977), chronic systolic CHF (EF 89-21%), HTN, GERD, CKD, HLD, TIA/CVA and paroxysmal atrial fibrillation/atrial tach/atrial flutter. She has seen EP multiple times over the last year to discuss antiarrhythmic therapy. She has not been a candidate for Tikosyn due to prolonged QT. CHF has made sotalol and dronederone a poor choice. Amiodarone has in the past has been associated with hepatotoxicity. She was admitted 03/2013 with atrial fib treated with short-acting dilt. She was readmitted with atrial tach 05/2013. AV node ablation and BiV insertion has been considered, however, due to multiple medical problems, the decision was made once again to use amiodarone instead. She was admitted 06/2013 with inferolateral ST elevation and underwent emergent cath with no culprit lesion found. There was possibility of microvascular disease. She underwent St. Jude CRT-D implantation 07/2013 by Dr. Caryl Comes. During that admission, amiodarone was increased due to recurrence of atrial tach. She has seen the CHF clinic in the interim. She has since gone to see Dr. Adrian Prows regarding atrial tach ablation. However, apparently while undergoing eval in his clinic, she was found to have rapid rates on 08/26/13 so was sent to the [facility] ER where code STEMI was called due to inferior ST elevation again. She was taken to the [facility] cath lab where cath again showed only mild  nonobstructive CAD. Dr. Ola Spurr evidently told her to stop taking her losartan.  She comes in this admission with epigastric pain/indigestion sensation moving upwards along with burping. She was seen in the ED 2 days ago with nausea, vomiting, diarrhea. CT abd/pelvis did not show any acute findings to explain her symptoms. In the ER, she appears to have atrial fib and atrial tach, as well as a faster wide-complex rhythm on telemetry, question VT vs SVT with abberrancy. As in prior EKGs, she develops ST elevation on EKG inferolaterally when in atrial fib without significant change from prior. K is 3.2, initial troponin is negative, labs otherwise nonacute.  CXR nonacute.  She is currently not having any ischemic type chest pain is not currently complaining of shortness of breath. Earlier she felt as if her defibrillator was trying to get her out of rhythm but she denied any definite discharges.  Past Medical History  Diagnosis Date  . Spinal stenosis   . Numbness of foot   . Diverticulitis     a. s/p partial colectomy 1/12 with reversal in July 2012.  . Colitis, ischemic   . DJD (degenerative joint disease)   . Ischemic cardiomyopathy     a. Echo 06/16/10: EF 25-30%, anteroseptal and apical hypokinesis, moderate AI, mild MR.   . LV (left ventricular) mural thrombus   . CKD (chronic kidney disease), stage III   . GERD (gastroesophageal reflux disease)   . Hypertension   . Hypothyroidism   . Hyperlipidemia   . Compression fracture     a. of L2  . 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 2/2 atrial  fibrillation with RVR in the context of hypokalemia  . PAF (paroxysmal atrial fibrillation)     a. on Eliquis, digoxin, and amiodarone  . Anemia   . H/O hiatal hernia   . Sinus bradycardia   . Mitral regurgitation   . CHF (congestive heart failure)   . CAD (coronary artery disease)     a.  Ant MI 8/03 with stenting to LAD;  b. staged PCI w/ DES to OM1  8/03;  c. s/p DES to LAD 7/04;  d.  multiple caths in past (chronically abnl ECG); e. cardiac cath 3/12: LAD stent patent, distal LAD 40-50%, small ostial D1 80%, ostial D2 60%, OM-1 stent patent (20-30%), proximal RCA 50%, prox- mRCA 40-50%; f. cath 2012 nonobs g. cath 06/2013 s/p STEMI- microvascular dz- treated medically   . History of TIA (transient ischemic attack)   . 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 s/p ICD implantation in 2005. b. Gen change 2006. 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 c. s/p CRT-D implantation 07/2013.  [name] Long term (current) use of anticoagulants   . Paroxysmal atrial tachycardia   . Paroxysmal atrial flutter   . Stroke      Most Recent Cardiac Studies: 2D Echo 07/2013 - Left ventricle: The cavity size was mildly dilated. Wall thickness was normal. Systolic function was severely reduced. The estimated ejection fraction was in the range of 20% to 25%. There is akinesis of the mid-apicalanteroseptal and inferior myocardium. Doppler parameters are consistent with abnormal left ventricular relaxation (grade 1 diastolic dysfunction). - Aortic valve: There was trivial regurgitation. - Mitral valve: Calcified annulus. There was mild regurgitation. - Right ventricle: The cavity size was mildly dilated. Wall thickness was normal.  Cath 08/2013 [facility] FINAL CARDIAC DIAGNOSTIC CATHETERIZATION REPORT Ayame, Rena  MR #: [id number]  Procedure Date: 08/26/2013 Account #:      [id number] Attending:      Dereck Leep  Location:       7AT Cath Attending: Ollen Bowl, MD Cath Assistant: Earlean Shawl, MD CARDIAC CATHETERIZATION PROCEDURE(S):    Left heart cath and Coronary Angiography [phone])    STUDY INDICATION(S):   Chest Pain (786.59)     Chronic Ischemic heart disease, unspecified (414.9)     APPROACH:   Approach 1:  Percutaneous right radial artery     Sheath: 71F         CATHETERS: 16F Tiger Radial 4.0   CONTRAST AGENT:   Omnipaque 350 55 cc   FLOURO TIME:      1.8 min     IN-LAB MEDICATIONS:  Medication         Dose  Units  Method  Time Given  Given By                  0.9 Normal Saline  200   ml     IV      14:24       Lanny Cramp RN         Lidocaine          5     ml     Starbuck      14:26       Garrel Ridgel MD   Lopressor          5     mg     IV      14:26  Lanny Cramp RN         Fentanyl           25    mcg    IV      14:27       Lanny Cramp RN         Versed             1     mg     IV      14:27       Lanny Cramp RN         Verapamil          3     mg     IA      14:28       Garrel Ridgel MD   Versed             0.5   mg     IV      14:29       Billy Fischer RN       Heparin            2500  units  IV      14:30       Billy Fischer RN       Heparin            2500  units  IV      14:37       Lanny Cramp RN              HEMODYNAMIC DATA:      Height:     64 in.      Weight:     135 lbs.  LEFT HEART CATHETERIZATION PROTOCOL  PRESSURES:      Site            Baseline Values, mm Hg      S/P Angiography, mm Hg                        S/D           Mean           S/D            Mean        LV             115 / 13                     117 / 15                     AO             114 / 78          91        126 / 61           93        OTHER HEMODYNAMIC INFORMATION:      Heart Rate:        97    CORONARY ANGIOGRAPHY: Ramus Present:       No Coronary Dominance:  Right Lesions/Grafts Comments: Widely patent coronary arteries      COMPLICATIONS: none   CONCLUSIONS:      CORONARYSTATUS: Mild non-obstructive ASCAD         OTHER:  [facility] called a code STEMI on this patient. Patient  brought directly to cath lab. EKG revealed AF with heart rate  100-140 and  diffuse ST segments with no reciprocal changes. Access via right radial  artery.  Widely patent coronaries and stent sites. No indication for  PCI.Stopped at diagnostic study. TR band for hemostasis.        I was present for key elements of the procedure; personally performed and/or directly supervised it.  I have personally interpreted the angiograms.  I also attest that all medications given and all point of care testing performed during the procedure were ordered by me.       Surgical History:  Past Surgical History  Procedure Laterality Date  . Back surgery    . Cardiac defibrillator placement  07/2003; 10/2004; 2014  . Lumbar laminectomy/decompression microdiscectomy  10/2001    L5-S1/E-chart  . Knee arthroscopy Right   . Thyroidectomy  1970's  . Total knee arthroplasty Left 11/2003  . X-stop implantation  12/2004    L3-4; L4-5  . Fixation kyphoplasty thoracic spine  07/2007    T3, 4, 6 compression fractures  . Shoulder open rotator cuff repair Right 04/2008  . Lumbar laminectomy/decompression microdiscectomy  01/2010  . Colectomy  03/2010    sigmoid left; transverse  . Peripherally inserted central catheter insertion  03/2010    removed upon discharge  . Colostomy reversal  12/2010  . Tonsillectomy and adenoidectomy  1969  . Appendectomy  03/2010  . Glaucoma surgery Right   . Cardiac defibrillator removal  12/17/2012    removed for infection  . Icd lead removal Left 12/17/2012    Procedure: ICD LEAD REMOVAL;  Surgeon: Evans Lance, MD;  Location: [place];  Service: Cardiovascular;  Laterality: Left;  . Coronary angioplasty with stent placement  10/2001; 09/2002; ?date    "I've got a total of 4" (04/10/2013)  . Bi-ventricular implantable cardioverter defibrillator  (crt-d)  08/05/2013    STJ Quadra Assura CRTD implanted by Dr Caryl Comes (right sided)     Home Meds: Prior to Admission medications   Medication Sig Start Date End Date Taking? Authorizing Provider  amiodarone (PACERONE) 200 MG tablet Take 200 mg by mouth daily.   Yes Historical Provider, MD    apixaban (ELIQUIS) 5 MG TABS tablet Take 1 tablet (5 mg total) by mouth 2 (two) times daily. 09/02/13  Yes Jolaine Artist, MD  clonazePAM (KLONOPIN) 0.5 MG tablet Take 1 tablet (0.5 mg total) by mouth 2 (two) times daily as needed for anxiety. 09/16/13  Yes Janith Lima, MD  digoxin (LANOXIN) 0.125 MG tablet Take 0.5 tablets (0.0625 mg total) by mouth daily. 06/18/13  Yes Rande Brunt, NP  fexofenadine (ALLEGRA) 180 MG tablet Take 180 mg by mouth daily as needed for allergies.    Yes Historical Provider, MD  fluticasone (FLONASE) 50 MCG/ACT nasal spray Place 2 sprays into both nostrils daily as needed for allergies.    Yes Historical Provider, MD  folic acid-pyridoxine-cyancobalamin (FOLTX) 2.5-25-2 MG TABS Take 1 tablet by mouth daily.    Yes Historical Provider, MD  furosemide (LASIX) 40 MG tablet Take 1 tablet (40 mg total) by mouth daily. 06/11/13  Yes Amy D Clegg, NP  levothyroxine (SYNTHROID, LEVOTHROID) 200 MCG tablet Take 200 mcg by mouth daily before breakfast.    Yes Historical Provider, MD  losartan (COZAAR) 25 MG tablet Take 1 tablet (25 mg total) by mouth daily. 08/21/13  Yes Rande Brunt, NP  nitroGLYCERIN (NITROSTAT) 0.4 MG SL tablet Place 0.4 mg under the tongue every 5 (five) minutes as needed for chest pain.  Yes Historical Provider, MD  omega-3 acid ethyl esters (LOVAZA) 1 G capsule Take 1 g by mouth daily.   Yes Historical Provider, MD  ondansetron (ZOFRAN-ODT) 4 MG disintegrating tablet Take 4 mg by mouth 2 (two) times daily as needed for nausea or vomiting.   Yes Historical Provider, MD  pantoprazole (PROTONIX) 40 MG tablet Take 1 tablet (40 mg total) by mouth 2 (two) times daily. 07/12/13  Yes Janith Lima, MD  polyethylene glycol [facility] / GLYCOLAX) packet Take 17 g by mouth daily as needed (for constipation).    Yes Historical Provider, MD  raloxifene (EVISTA) 60 MG tablet Take 60 mg by mouth daily.   Yes Historical Provider, MD  spironolactone (ALDACTONE) 25 MG  tablet Take 25 mg by mouth daily. 08/07/13  Yes Dayna N Dunn, PA-C    Allergies:  Allergies  Allergen Reactions  . Coconut Oil Anaphylaxis and Hives  . Lansoprazole Nausea Only  . Penicillins Swelling    Throat swells  . Statins Other (See Comments)    cramps  . Lisinopril     Cough- but still tolerates it  . Lactose Intolerance (Gi) Diarrhea and Other (See Comments)    Patient reports abdominal cramping and diarrhea with lactose products.     History   Social History  . Marital Status: Widowed    Spouse Name: N/A    Number of Children: N/A  . Years of Education: N/A   Occupational History  . Retired    Social History Main Topics  . Smoking status: Former Smoker -- 0.10 packs/day for 33 years    Types: Cigarettes    Quit date: 04/21/2001  . Smokeless tobacco: Never Used     Comment: "smoked ~ 1 pack/month"  . Alcohol Use: 0.6 oz/week    1 Glasses of wine per week  . Drug Use: No  . Sexual Activity: Not Currently   Other Topics Concern  . Not on file   Social History Narrative   Lives alone, has a house, has some household help. Handicapped upfitted.     Family History  Problem Relation Age of Onset  . Diabetes Mother   . Diabetes Brother   . Arthritis      family history  . Prostate cancer      Family History    Review of Systems: She has had significant nausea as well as diarrhea for the past couple of days. She has a moderate amount of anxiety. She does have some urinary frequency. She has significant spinal stenosis and significant chronic low back pain. She also has some new compression fractures noted of her spine. She has mild chronic dyspnea. She has no PND orthopnea.  Other than as noted above the remainder of the review of systems is unremarkable.   Labs:   Lab Results  Component Value Date   WBC 5.7 09/29/2013   HGB 15.3* 09/29/2013   HCT 45.0 09/29/2013   MCV 98.0 09/29/2013   PLT 149* 09/29/2013    Recent Labs Lab 09/27/13 1216  09/29/13 1626  NA 138 141  K 5.7* 3.2*  CL 97 102  CO2 22  --   BUN 8 4*  CREATININE 0.80 0.90  CALCIUM 8.5  --   PROT 7.4  --   BILITOT 0.9  --   ALKPHOS 42  --   ALT 33  --   AST 80*  --   GLUCOSE 89 94    Recent Labs  09/27/13 1216  TROPONINI <  0.30   Lab Results  Component Value Date   CHOL 226* 04/22/2013   HDL 119 04/22/2013   LDLCALC 93 04/22/2013   TRIG 68 04/22/2013     Dg Chest Port 1 View  09/29/2013   CLINICAL DATA:  Chest pain and shortness of breath.  EXAM: PORTABLE CHEST - 1 VIEW  COMPARISON:  09/27/2013  FINDINGS: Right chest wall pacemaker appears stable. Mild cardiomegaly is stable. Coronary artery stents noted. Atherosclerotic calcification of the thoracic aortic arch. Normal pulmonary vascularity. Lungs are well expanded clear.  Vertebroplasty changes at 3 levels in the upper thoracic spine appears stable. Partially imaged postsurgical changes of the lumbar spine.  IMPRESSION: Stable mild cardiomegaly.  No acute findings.  The lungs are clear.  Vertebroplasty changes at 3 levels in the thoracic spine.   Electronically Signed   By: Curlene Dolphin M.D.   On: 09/29/2013 15:48   EKG:  First: Atrial fib 118bpm, inferiolateral ST elevation similar to prior Next: wide complex tach ? VT vs SVT with abberration 175bpm Currently in normal sinus rhythm  Physical Exam: Blood pressure 139/98, pulse 114, temperature 98.7 F (37.1 C), temperature source Oral, resp. rate 12, SpO2 96.00%. General: Pleasant thin black female in no acute distress Head: Normocephalic, atraumatic, sclera non-icteric, no xanthomas, nares are without discharge.  Neck: Negative for carotid bruits. JVD not elevated. Lungs: Clear, healed ICD incision in right subclavian area  Heart: RRR with S1 S2. No murmurs, rubs, or gallops appreciated. Abdomen: Soft, non-tender, non-distended with normoactive bowel sounds. No hepatomegaly. No rebound/guarding. No obvious abdominal masses. Msk:  Strength and tone  appear normal for age. Extremities: No clubbing or cyanosis. No edema.  Distal pedal pulses are 2+ and equal bilaterally. Neuro: Alert and oriented X 3. No focal deficit. No facial asymmetry. Moves all extremities spontaneously. Psych:  Responds to questions appropriately with a normal affect.    ASSESSMENT AND PLAN:  1. Epigastric pain of uncertain cause 2. Recent nausea/vomiting/diarrhea 3. Refractory atrial arrhythmias - history of atrial fib/flutter/tach as well as refractory atrial tachycardia. 4. Wide complex tachycardia, question VT vs SVT-most likely abberancy 5. ICM s/p CRT-D (explant 2014 due to infection, then implant 07/2013) 5. H/o CAD s/p remote PCI, with 2 recent caths with mild nonbstructive disease-most recent one was in June 6. Hypokalemia 7. Ischemic cardiomyopathy with chronic systolic heart failure 8. Hypothyroidism under treatment   Recommendations:  She has continued nausea as well as diarrhea so we will evaluate a dig level. We will watch her overnight for observation. Perhaps adjust medications and further EP evaluation in the morning. Replete potassium.  Kerry Hough. MD [facility] 09/29/2013

## 2013-09-29 NOTE — ED Notes (Addendum)
Phlebotomy at bedside.

## 2013-09-30 ENCOUNTER — Encounter (HOSPITAL_COMMUNITY): Payer: Self-pay | Admitting: *Deleted

## 2013-09-30 ENCOUNTER — Encounter (HOSPITAL_COMMUNITY): Payer: Medicare Other

## 2013-09-30 DIAGNOSIS — I4891 Unspecified atrial fibrillation: Secondary | ICD-10-CM

## 2013-09-30 DIAGNOSIS — R112 Nausea with vomiting, unspecified: Secondary | ICD-10-CM

## 2013-09-30 DIAGNOSIS — I471 Supraventricular tachycardia: Secondary | ICD-10-CM | POA: Diagnosis not present

## 2013-09-30 DIAGNOSIS — I2510 Atherosclerotic heart disease of native coronary artery without angina pectoris: Secondary | ICD-10-CM

## 2013-09-30 LAB — BASIC METABOLIC PANEL
Anion gap: 15 (ref 5–15)
BUN: 7 mg/dL (ref 6–23)
CALCIUM: 8.1 mg/dL — AB (ref 8.4–10.5)
CO2: 26 mEq/L (ref 19–32)
Chloride: 97 mEq/L (ref 96–112)
Creatinine, Ser: 1 mg/dL (ref 0.50–1.10)
GFR calc Af Amer: 68 mL/min — ABNORMAL LOW (ref >90)
GFR calc non Af Amer: 59 mL/min — ABNORMAL LOW (ref >90)
Glucose, Bld: 102 mg/dL — ABNORMAL HIGH (ref 70–99)
Potassium: 4.1 mEq/L (ref 3.7–5.3)
SODIUM: 138 meq/L (ref 137–147)

## 2013-09-30 LAB — CBC
HCT: 35.2 % — ABNORMAL LOW (ref 36.0–46.0)
HEMOGLOBIN: 12 g/dL (ref 12.0–15.0)
MCH: 34.3 pg — AB (ref 26.0–34.0)
MCHC: 34.1 g/dL (ref 30.0–36.0)
MCV: 100.6 fL — ABNORMAL HIGH (ref 78.0–100.0)
Platelets: 155 10*3/uL (ref 150–400)
RBC: 3.5 MIL/uL — ABNORMAL LOW (ref 3.87–5.11)
RDW: 16.7 % — ABNORMAL HIGH (ref 11.5–15.5)
WBC: 5.7 10*3/uL (ref 4.0–10.5)

## 2013-09-30 LAB — MRSA PCR SCREENING: MRSA BY PCR: NEGATIVE

## 2013-09-30 LAB — CLOSTRIDIUM DIFFICILE BY PCR: Toxigenic C. Difficile by PCR: NEGATIVE

## 2013-09-30 LAB — DIGOXIN LEVEL: DIGOXIN LVL: 0.7 ng/mL — AB (ref 0.8–2.0)

## 2013-09-30 MED ORDER — CLONAZEPAM 0.5 MG PO TABS: 0.5000 mg | ORAL_TABLET | Freq: Two times a day (BID) | ORAL | Status: DC | PRN

## 2013-09-30 MED ORDER — ACETAMINOPHEN 325 MG PO TABS: 650.0000 mg | ORAL_TABLET | ORAL | Status: DC | PRN

## 2013-09-30 MED ORDER — DIGOXIN 0.0625 MG HALF TABLET
0.0625 mg | ORAL_TABLET | Freq: Every day | ORAL | Status: DC
  Administered 2013-09-30 – 2013-10-03 (×4): 0.0625 mg via ORAL
  Filled 2013-09-30 (×4): qty 1

## 2013-09-30 MED ORDER — LORATADINE 10 MG PO TABS
10.0000 mg | ORAL_TABLET | Freq: Every day | ORAL | Status: DC | PRN
  Filled 2013-09-30: qty 1

## 2013-09-30 MED ORDER — OMEGA-3-ACID ETHYL ESTERS 1 G PO CAPS
1.0000 g | ORAL_CAPSULE | Freq: Every day | ORAL | Status: DC
  Administered 2013-09-30 – 2013-10-03 (×4): 1 g via ORAL
  Filled 2013-09-30 (×4): qty 1

## 2013-09-30 MED ORDER — PANTOPRAZOLE SODIUM 40 MG PO TBEC
40.0000 mg | DELAYED_RELEASE_TABLET | Freq: Two times a day (BID) | ORAL | Status: DC
  Administered 2013-09-30 – 2013-10-03 (×8): 40 mg via ORAL
  Filled 2013-09-30 (×8): qty 1

## 2013-09-30 MED ORDER — LEVOTHYROXINE SODIUM 200 MCG PO TABS
200.0000 ug | ORAL_TABLET | Freq: Every day | ORAL | Status: DC
  Administered 2013-09-30 – 2013-10-03 (×4): 200 ug via ORAL
  Filled 2013-09-30 (×5): qty 1

## 2013-09-30 MED ORDER — ONDANSETRON HCL 4 MG/2ML IJ SOLN: 4.0000 mg | Freq: Four times a day (QID) | INTRAMUSCULAR | Status: DC | PRN

## 2013-09-30 MED ORDER — RALOXIFENE HCL 60 MG PO TABS
60.0000 mg | ORAL_TABLET | Freq: Every day | ORAL | Status: DC
  Administered 2013-09-30 – 2013-10-03 (×4): 60 mg via ORAL
  Filled 2013-09-30 (×4): qty 1

## 2013-09-30 MED ORDER — FA-PYRIDOXINE-CYANOCOBALAMIN 2.5-25-2 MG PO TABS
1.0000 | ORAL_TABLET | Freq: Every day | ORAL | Status: DC
  Administered 2013-09-30 – 2013-10-03 (×4): 1 via ORAL
  Filled 2013-09-30 (×4): qty 1

## 2013-09-30 MED ORDER — FLUTICASONE PROPIONATE 50 MCG/ACT NA SUSP: 2.0000 | Freq: Every day | NASAL | Status: DC | PRN

## 2013-09-30 MED ORDER — BOOST / RESOURCE BREEZE PO LIQD
1.0000 | Freq: Two times a day (BID) | ORAL | Status: DC
  Administered 2013-09-30 – 2013-10-03 (×4): 1 via ORAL

## 2013-09-30 MED ORDER — SPIRONOLACTONE 25 MG PO TABS
25.0000 mg | ORAL_TABLET | Freq: Every day | ORAL | Status: DC
  Administered 2013-09-30 – 2013-10-03 (×4): 25 mg via ORAL
  Filled 2013-09-30 (×4): qty 1

## 2013-09-30 NOTE — Progress Notes (Signed)
Patient Name: [name]      SUBJECTIVE: still nauseated  Last bm 0400 no vomiting since yday Has reverted to sinus Plan is for subsequent ablation  Past Medical History  Diagnosis Date  . Spinal stenosis   . Numbness of foot   . Diverticulitis     a. s/p partial colectomy 1/12 with reversal in July 2012.  . Colitis, ischemic   . DJD (degenerative joint disease)   . Ischemic cardiomyopathy     a. Echo 06/16/10: EF 25-30%, anteroseptal and apical hypokinesis, moderate AI, mild MR.   . LV (left ventricular) mural thrombus   . CKD (chronic kidney disease), stage III   . GERD (gastroesophageal reflux disease)   . Hypertension   . Hypothyroidism   . Hyperlipidemia   . Compression fracture     a. of L2  . 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 2/2 atrial fibrillation with RVR in the context of hypokalemia  . PAF (paroxysmal atrial fibrillation)     a. on Eliquis, digoxin, and amiodarone  . Anemia   . H/O hiatal hernia   . Sinus bradycardia   . Mitral regurgitation   . CHF (congestive heart failure)   . CAD (coronary artery disease)     a.  Ant MI 8/03 with stenting to LAD;  b. staged PCI w/ DES to OM1 8/03;  c. s/p DES to LAD 7/04;  d.  multiple caths in past (chronically abnl ECG); e. cardiac cath 3/12: LAD stent patent, distal LAD 40-50%, small ostial D1 80%, ostial D2 60%, OM-1 stent patent (20-30%), proximal RCA 50%, prox- mRCA 40-50%; f. cath 2012 nonobs g. cath 06/2013 s/p STEMI- microvascular dz- treated medically   . History of TIA (transient ischemic attack)   . 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 s/p ICD implantation in 2005. b. Gen change 2006. 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 c. s/p CRT-D implantation 07/2013.  [name] Long term (current) use of anticoagulants   . Paroxysmal atrial tachycardia   . Paroxysmal atrial flutter     . Stroke   . Osteoporosis     Scheduled Meds:  Scheduled Meds: . amiodarone  200 mg Oral Daily  . apixaban  5 mg Oral BID  . digoxin  0.0625 mg Oral Daily  . folic acid-pyridoxine-cyancobalamin  1 tablet Oral Daily  . furosemide  40 mg Oral Daily  . levothyroxine  200 mcg Oral QAC breakfast  . omega-3 acid ethyl esters  1 g Oral Daily  . pantoprazole  40 mg Oral BID  . raloxifene  60 mg Oral Daily  . spironolactone  25 mg Oral Daily   Continuous Infusions:  acetaminophen, clonazePAM, fluticasone, loratadine, nitroGLYCERIN, ondansetron (ZOFRAN) IV    PHYSICAL EXAM Filed Vitals:   09/30/13 0300 09/30/13 0400 09/30/13 0700 09/30/13 0900  BP: 122/37 101/40 101/52 112/76  Pulse:      Temp:  99 F (37.2 C) 98.5 F (36.9 C)   TempSrc:  Oral Oral   Resp: 14 17 13 15   Height:      Weight:      SpO2: 98% 96% 98% 95%    Well developed and nourished in no acute distress HENT normal Neck supple with JVP-flat Clear Regular rate and rhythm, no murmurs or gallops Abd-soft with active BS No Clubbing cyanosis edema Skin-warm and dry A & Oriented  Grossly normal sensory and motor function   TELEMETRY: Reviewed telemetry pt in Apcing:    Intake/Output Summary (Last 24 hours) at 09/30/13 0916 Last data filed at 09/30/13 0000  Gross per 24 hour  Intake      0 ml  Output    250 ml  Net   -250 ml    LABS: Basic Metabolic Panel:  Recent Labs Lab 09/27/13 1216 09/29/13 1605 09/29/13 1626 09/30/13 0300  NA 138  --  141 138  K 5.7*  --  3.2* 4.1  CL 97  --  102 97  CO2 22  --   --  26  GLUCOSE 89  --  94 102*  BUN 8  --  4* 7  CREATININE 0.80  --  0.90 1.00  CALCIUM 8.5  --   --  8.1*  MG  --  1.8  --   --    Cardiac Enzymes:  Recent Labs  09/27/13 1216  TROPONINI <0.30   CBC:  Recent Labs Lab 09/27/13 1216 09/29/13 1605 09/29/13 1626 09/30/13 0300  WBC 5.2 5.7  --  5.7  NEUTROABS 3.2 4.2  --   --   HGB 13.3 13.5 15.3* 12.0  HCT 38.9 38.7 45.0  35.2*  MCV 99.5 98.0  --  100.6*  PLT 153 149*  --  155   PROTIME:  Recent Labs  09/29/13 1605  LABPROT 14.1  INR 1.09   Liver Function Tests:  Recent Labs  09/27/13 1216  AST 80*  ALT 33  ALKPHOS 42  BILITOT 0.9  PROT 7.4  ALBUMIN 3.3*    Recent Labs  09/27/13 1216  LIPASE 20   BNP: BNP (last 3 results)  Recent Labs  04/22/13 1200 06/04/13 0957 06/18/13 1327  PROBNP 754.3* 2458.0* 1223.0*       ASSESSMENT AND PLAN:  Principal Problem:   Refractory paroxysmal atrial tachycardia Active Problems:   Ischemic cardiomyopathy   CAD (coronary artery disease)   AICD (automatic cardioverter/defibrillator) present   Pulmonary hypertension   Current use of long term anticoagulation   Atrial fibrillation  rhtyhm stable  Plan will be to pursue outpt ablation with [facility] at their mutual convenience mian problem remains nausea and vomiting.  Will ask GI to see and advance diet--still poor Anticipate discharge in am  Signed, Virl Axe MD  09/30/2013

## 2013-09-30 NOTE — Progress Notes (Signed)
INITIAL NUTRITION ASSESSMENT  DOCUMENTATION CODES Per approved criteria  -Severe malnutrition in the context of acute illness or injury   INTERVENTION: Add Resource Breeze po BID, each supplement provides 250 kcal and 9 grams of protein. Pt prefers peach flavor. RD to continue to follow nutrition care plan.  NUTRITION DIAGNOSIS: Inadequate oral intake related to limited appetite and altered GI function as evidenced by patient report and weight loss.   Goal: Intake to meet >90% of estimated nutrition needs.  Monitor:  weight trends, lab trends, I/O's, PO intake, supplement tolerance  Reason for Assessment: Malnutrition Screening Tool  63 y.o. female  Admitting Dx: Atrial tachycardia, paroxysmal  ASSESSMENT: PMHx significant for CAD, ICM, CHF, HTN, GERD, CKD, HLD, TIA/CVA. Admitted with epigastric pain/indigestion. Work-up reveals refractory paroxysmal atrial tachycardia.  Patient reports no vomiting since yesterday. Pt still has nausea. She is tolerating small amounts of meals, consuming <25%. States that her MD recommended [employer] when saw him as an outpatient and enjoys the [employer]. She states that he recommended it for her 2/2 her recent weight loss.  PTA, she states that had diarrhea and vomiting for a week. Was supposed to take oral contrast but was unable to keep it down, so went to the ED and received IV contrast. She reports that all of her tests have come back negative and that she would like to see if she can get an upper endoscopy while she is still here. GI consult pending.  Per cardiology note, plan to pursue outpatient ablation.   Nutrition Focused Physical Exam:  Subcutaneous Fat:  Orbital Region: WNL Upper Arm Region: moderate depletion Thoracic and Lumbar Region: WNL  Muscle:  Temple Region: WNL Clavicle Bone Region: WNL Clavicle and Acromion Bone Region: WNL Scapular Bone Region: n/a Dorsal Hand: n/a Patellar Region: mild  depletion Anterior Thigh Region: mild depletion Posterior Calf Region: mild depletion  Edema: n/a  Pt meets criteria for severe MALNUTRITION in the context of acute illness as evidenced by 8% wt loss x 2 months and intake of <50% x at least 5 days.   Height: Ht Readings from Last 1 Encounters:  09/30/13 5\' 4"  (1.626 m)    Weight: Wt Readings from Last 1 Encounters:  09/30/13 129 lb 3 oz (58.6 kg)    Ideal Body Weight: 120 lb  % Ideal Body Weight: 108%  Wt Readings from Last 25 Encounters:  09/30/13 129 lb 3 oz (58.6 kg)  09/16/13 129 lb (58.514 kg)  08/21/13 136 lb (61.689 kg)  08/07/13 143 lb 1.3 oz (64.9 kg)  08/07/13 143 lb 1.3 oz (64.9 kg)  07/12/13 141 lb 8 oz (64.184 kg)  07/10/13 139 lb 2 oz (63.107 kg)  07/08/13 140 lb 12.8 oz (63.866 kg)  07/08/13 140 lb 12.8 oz (63.866 kg)  06/24/13 143 lb (64.864 kg)  06/18/13 143 lb (64.864 kg)  06/11/13 139 lb 4.8 oz (63.186 kg)  06/11/13 139 lb 4.8 oz (63.186 kg)  05/23/13 145 lb 8.1 oz (66 kg)  05/02/13 145 lb (65.772 kg)  04/25/13 142 lb (64.411 kg)  04/22/13 140 lb 1.6 oz (63.549 kg)  04/11/13 142 lb 11.2 oz (64.728 kg)  04/01/13 145 lb 1.9 oz (65.826 kg)  03/12/13 139 lb 15.9 oz (63.5 kg)  03/06/13 146 lb (66.225 kg)  01/17/13 144 lb (65.318 kg)  12/19/12 143 lb 12.8 oz (65.227 kg)  12/19/12 143 lb 12.8 oz (65.227 kg)  12/14/12 142 lb 4 oz (64.524 kg)     Usual  Body Weight: 140 lb  % Usual Body Weight: 92%  BMI:  Body mass index is 22.16 kg/(m^2). WNL  Estimated Nutritional Needs: Kcal: 1500 - 1650 Protein: 60 - 70 grams Fluid: at least 1.5 liters daily  Skin: intact  Diet Order: Sodium Restricted  EDUCATION NEEDS: -Education not appropriate at this time   Intake/Output Summary (Last 24 hours) at 09/30/13 0955 Last data filed at 09/30/13 0000  Gross per 24 hour  Intake      0 ml  Output    250 ml  Net   -250 ml    Last BM: 7/13  Labs:   Recent Labs Lab 09/27/13 1216 09/29/13 1605  09/29/13 1626 09/30/13 0300  NA 138  --  141 138  K 5.7*  --  3.2* 4.1  CL 97  --  102 97  CO2 22  --   --  26  BUN 8  --  4* 7  CREATININE 0.80  --  0.90 1.00  CALCIUM 8.5  --   --  8.1*  MG  --  1.8  --   --   GLUCOSE 89  --  94 102*    CBG (last 3)  No results found for this basename: GLUCAP,  in the last 72 hours  Scheduled Meds: . amiodarone  200 mg Oral Daily  . apixaban  5 mg Oral BID  . digoxin  0.0625 mg Oral Daily  . folic acid-pyridoxine-cyancobalamin  1 tablet Oral Daily  . furosemide  40 mg Oral Daily  . levothyroxine  200 mcg Oral QAC breakfast  . omega-3 acid ethyl esters  1 g Oral Daily  . pantoprazole  40 mg Oral BID  . raloxifene  60 mg Oral Daily  . spironolactone  25 mg Oral Daily    Continuous Infusions:   Past Medical History  Diagnosis Date  . Spinal stenosis   . Numbness of foot   . Diverticulitis     a. s/p partial colectomy 1/12 with reversal in July 2012.  . Colitis, ischemic   . DJD (degenerative joint disease)   . Ischemic cardiomyopathy     a. Echo 06/16/10: EF 25-30%, anteroseptal and apical hypokinesis, moderate AI, mild MR.   . LV (left ventricular) mural thrombus   . CKD (chronic kidney disease), stage III   . GERD (gastroesophageal reflux disease)   . Hypertension   . Hypothyroidism   . Hyperlipidemia   . Compression fracture     a. of L2  . 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 2/2 atrial fibrillation with RVR in the context of hypokalemia  . PAF (paroxysmal atrial fibrillation)     a. on Eliquis, digoxin, and amiodarone  . Anemia   . H/O hiatal hernia   . Sinus bradycardia   . Mitral regurgitation   . CHF (congestive heart failure)   . CAD (coronary artery disease)     a.  Ant MI 8/03 with stenting to LAD;  b. staged PCI w/ DES to OM1 8/03;  c. s/p DES to LAD 7/04;  d.  multiple caths in past (chronically abnl ECG); e. cardiac cath 3/12: LAD stent patent, distal LAD 40-50%, small  ostial D1 80%, ostial D2 60%, OM-1 stent patent (20-30%), proximal RCA 50%, prox- mRCA 40-50%; f. cath 2012 nonobs g. cath 06/2013 s/p STEMI- microvascular dz- treated medically   . History of TIA (transient ischemic attack)   . Automatic implantable cardioverter-defibrillator in situ  08/05/2013    a. s/p ICD implantation in 2005. b. Gen change 2006. 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 c. s/p CRT-D implantation 07/2013.  [name] Long term (current) use of anticoagulants   . Paroxysmal atrial tachycardia   . Paroxysmal atrial flutter   . Stroke   . Osteoporosis     Past Surgical History  Procedure Laterality Date  . Back surgery    . Cardiac defibrillator placement  07/2003; 10/2004; 2014  . Lumbar laminectomy/decompression microdiscectomy  10/2001    L5-S1/E-chart  . Knee arthroscopy Right   . Thyroidectomy  1970's  . Total knee arthroplasty Left 11/2003  . X-stop implantation  12/2004    L3-4; L4-5  . Fixation kyphoplasty thoracic spine  07/2007    T3, 4, 6 compression fractures  . Shoulder open rotator cuff repair Right 04/2008  . Lumbar laminectomy/decompression microdiscectomy  01/2010  . Colectomy  03/2010    sigmoid left; transverse  . Peripherally inserted central catheter insertion  03/2010    removed upon discharge  . Colostomy reversal  12/2010  . Tonsillectomy and adenoidectomy  1969  . Appendectomy  03/2010  . Glaucoma surgery Right   . Cardiac defibrillator removal  12/17/2012    removed for infection  . Icd lead removal Left 12/17/2012    Procedure: ICD LEAD REMOVAL;  Surgeon: Evans Lance, MD;  Location: [place];  Service: Cardiovascular;  Laterality: Left;  . Coronary angioplasty with stent placement  10/2001; 09/2002; ?date    "I've got a total of 4" (04/10/2013)  . Bi-ventricular implantable cardioverter defibrillator  (crt-d)  08/05/2013    STJ Quadra Assura CRTD implanted by Dr Caryl Comes (right sided)    Inda Coke MS,  RD, LDN Inpatient Registered Dietitian Pager: [phone] After-hours pager: [phone]

## 2013-10-01 DIAGNOSIS — I471 Supraventricular tachycardia: Secondary | ICD-10-CM | POA: Diagnosis not present

## 2013-10-01 LAB — HEPATIC FUNCTION PANEL
ALT: 23 U/L (ref 0–35)
AST: 52 U/L — ABNORMAL HIGH (ref 0–37)
Albumin: 3.2 g/dL — ABNORMAL LOW (ref 3.5–5.2)
Alkaline Phosphatase: 58 U/L (ref 39–117)
Bilirubin, Direct: 0.3 mg/dL (ref 0.0–0.3)
Indirect Bilirubin: 0.5 mg/dL (ref 0.3–0.9)
Total Bilirubin: 0.8 mg/dL (ref 0.3–1.2)
Total Protein: 7.2 g/dL (ref 6.0–8.3)

## 2013-10-01 MED ORDER — CARVEDILOL 3.125 MG PO TABS
3.1250 mg | ORAL_TABLET | Freq: Two times a day (BID) | ORAL | Status: DC
  Administered 2013-10-01 – 2013-10-03 (×4): 3.125 mg via ORAL
  Filled 2013-10-01 (×7): qty 1

## 2013-10-01 MED ORDER — NYSTATIN 100000 UNIT/ML MT SUSP
5.0000 mL | Freq: Four times a day (QID) | OROMUCOSAL | Status: AC
  Administered 2013-10-01 (×3): 500000 [IU] via ORAL
  Filled 2013-10-01 (×3): qty 5

## 2013-10-01 NOTE — Progress Notes (Signed)
Patient is currently active with [facility] for chronic disease management services.  Patient has been engaged by a [employer].  Our community based plan of care has focused on disease management.  Patient has been compliant of health maintenance regimen in the community prior to admission.  She will receive a post discharge transition of care call and will be evaluated for monthly home visits for assessments and disease process education.  Made Inpatient Case Manager aware that [facility] following. Of note, [facility] services does not replace or interfere with any services that are arranged by inpatient case management or social work.  For additional questions or referrals please contact Corliss Blacker BSN RN [facility] Liaison at [phone].

## 2013-10-01 NOTE — Progress Notes (Addendum)
SUBJECTIVE: The patient is doing well today.  Her nausea and diarrhea are improved.  She still has epigastric pain with increased belching.  She feels as though her GI symptoms triggered her atrial arrhythmias.    GI eval has been requested this morning for any further recommendations   She has follow-up planned with Dr Ola Spurr to discuss AF ablation.   CURRENT MEDICATIONS: . amiodarone  200 mg Oral Daily  . apixaban  5 mg Oral BID  . digoxin  0.0625 mg Oral Daily  . feeding supplement (RESOURCE BREEZE)  1 Container Oral BID BM  . folic acid-pyridoxine-cyancobalamin  1 tablet Oral Daily  . furosemide  40 mg Oral Daily  . levothyroxine  200 mcg Oral QAC breakfast  . omega-3 acid ethyl esters  1 g Oral Daily  . pantoprazole  40 mg Oral BID  . raloxifene  60 mg Oral Daily  . spironolactone  25 mg Oral Daily      OBJECTIVE: Physical Exam: Filed Vitals:   10/01/13 0000 10/01/13 0300 10/01/13 0400 10/01/13 0726  BP: 110/51 121/54 122/46 112/55  Pulse:  80  71  Temp:  98.2 F (36.8 C)  97.9 F (36.6 C)  TempSrc:  Oral  Oral  Resp: 13 23 12 12   Height:      Weight:  128 lb 12 oz (58.4 kg)    SpO2:  98%  99%    Intake/Output Summary (Last 24 hours) at 10/01/13 0910 Last data filed at 09/30/13 2200  Gross per 24 hour  Intake    480 ml  Output    450 ml  Net     30 ml    Telemetry reveals AV pacing earlier today, now back in afib  GEN- The patient is well appearing, alert and oriented x 3 today.   Head- normocephalic, atraumatic Eyes-  Sclera clear, conjunctiva pink Ears- hearing intact Oropharynx- clear Neck- supple, no JVP Lymph- no cervical lymphadenopathy Lungs- Clear to ausculation bilaterally, normal work of breathing Heart- irregular rate and rhythm  GI- soft, NT, ND, + BS Extremities- no clubbing, cyanosis, or edema   LABS: Basic Metabolic Panel:  Recent Labs  09/29/13 1605 09/29/13 1626 09/30/13 0300  NA  --  141 138  K  --  3.2* 4.1  CL   --  102 97  CO2  --   --  26  GLUCOSE  --  94 102*  BUN  --  4* 7  CREATININE  --  0.90 1.00  CALCIUM  --   --  8.1*  MG 1.8  --   --    CBC:  Recent Labs  09/29/13 1605 09/29/13 1626 09/30/13 0300  WBC 5.7  --  5.7  NEUTROABS 4.2  --   --   HGB 13.5 15.3* 12.0  HCT 38.7 45.0 35.2*  MCV 98.0  --  100.6*  PLT 149*  --  155    RADIOLOGY: Dg Chest 2 View 09/27/2013   CLINICAL DATA:  Shortness of breath and weakness.  EXAM: CHEST  2 VIEW  COMPARISON:  Two-view chest 08/06/2013  FINDINGS: Heart size is normal. Pacing and defibrillator wires are stable. The lungs are clear. No edema or effusion is present. Emphysematous changes are again noted. Spinal augmentation in the upper thoracic spine is stable. Postsurgical changes are again noted in the lumbar spine. A remote fracture just above the lumbar hardware is stable.  IMPRESSION: 1. No acute cardiopulmonary disease. 2. Stable emphysematous changes.  3. Atherosclerosis. 4. Postsurgical changes of the spine.   Electronically Signed   By: Lawrence Santiago M.D.   On: 09/27/2013 12:57   Ct Abdomen Pelvis W Contrast 09/27/2013   CLINICAL DATA:  Vomiting and abdominal pain for 1 week. Occasional diarrhea.  EXAM: CT ABDOMEN AND PELVIS WITH CONTRAST  TECHNIQUE: Multidetector CT imaging of the abdomen and pelvis was performed using the standard protocol following bolus administration of intravenous contrast.  CONTRAST:  100 mL OMNIPAQUE IOHEXOL 300 MG/ML  SOLN  COMPARISON:  CT abdomen and pelvis 02/28/2012.  FINDINGS: There is some linear scar in the left lung base, unchanged. Dependent atelectasis is also noted. No pleural or pericardial effusion. There is mild cardiomegaly.  There is some high attenuation material the gallbladder consistent with sludge. No evidence of inflammatory change is identified. The liver, spleen, adrenal glands, kidneys, pancreas and biliary tree are unremarkable. Uterus and adnexa are unremarkable. The patient is status post  subtotal colectomy without evidence of complication. The stomach and small bowel appear normal. No lymphadenopathy or fluid collection is identified.  The patient is status post L3-S1 fusion. Remote L1 and L2 compression fractures are identified. There is also a compression fracture of T12 which is new since the comparison CT scan but appears remote. No worrisome bony lesion is identified.  IMPRESSION: No acute finding or finding to explain the patient's symptoms.  Status post subtotal colectomy.  Gallbladder sludge without evidence of inflammatory change.  Remote appearing T11 compression fracture is new since the comparison 2013 CT scan. L1 and L2 compression fractures are unchanged.   Electronically Signed   By: Inge Rise M.D.   On: 09/27/2013 15:43   ASSESSMENT AND PLAN:  Principal Problem:   Refractory paroxysmal atrial tachycardia Active Problems:   Ischemic cardiomyopathy   CAD (coronary artery disease)   AICD (automatic cardioverter/defibrillator) present   Pulmonary hypertension   Current use of long term anticoagulation   Atrial fibrillation  1. afib Continue amiodarone She is planned to have ablation by Dr Ola Spurr in the near future. She is anticoagulated with eliquis Needs better rate control--> will restart coreg  2. N/V/Diarrhea Clinically much improved GI following  3. Chronic systolic dysfunction Restart coreg  Consider telemetry once rates are improved.

## 2013-10-01 NOTE — Consult Note (Addendum)
Referring Provider: Dr. Caryl Comes Primary Care Physician:  [name], MD Primary Gastroenterologist:  Dr. Michail Sermon  Reason for Consultation:  Nausea and Vomiting; Epigastric pain  HPI: [name] is a 63 y.o. female with multiple medical problems as stated below who is s/p a left hemicolectomy due to diverticular disease being seen for a consult due to N/V/epigastric pain/diarrhea. Reports diarrhea started last Thursday and has resolved stating she had a solid stool this morning. Also, C. Diff negative. Abdominal pain/N/V worsened last week and came to hospital and CT unrevealing. Sent home and returned during weekend with chest pain and found to be in Afib. Reports recent defibrillator change in May. Reports chronic abdominal pain since hemicolectomy but pain worsened last week and was mainly in the epigastric and substernal area. Unable to keep any POs down last Friday and over the weekend. Currently able to keep POs down but feels pain with swallowing and reports sensation that food hangs up at times. Denies hematemesis, hematochezia, or melena. EGD in 2013 showed gastritis and esophagitis with negative brushing. Feels somewhat better in that N/V has improved and diarrhea has resolved.   Past Medical History  Diagnosis Date  . Spinal stenosis   . Numbness of foot   . Diverticulitis     a. s/p partial colectomy 1/12 with reversal in July 2012.  . Colitis, ischemic   . DJD (degenerative joint disease)   . Ischemic cardiomyopathy     a. Echo 06/16/10: EF 25-30%, anteroseptal and apical hypokinesis, moderate AI, mild MR.   . LV (left ventricular) mural thrombus   . CKD (chronic kidney disease), stage III   . GERD (gastroesophageal reflux disease)   . Hypertension   . Hypothyroidism   . Hyperlipidemia   . Compression fracture     a. of L2  . 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 2/2 atrial fibrillation with RVR in the context of hypokalemia  .  PAF (paroxysmal atrial fibrillation)     a. on Eliquis, digoxin, and amiodarone  . Anemia   . H/O hiatal hernia   . Sinus bradycardia   . Mitral regurgitation   . CHF (congestive heart failure)   . CAD (coronary artery disease)     a.  Ant MI 8/03 with stenting to LAD;  b. staged PCI w/ DES to OM1 8/03;  c. s/p DES to LAD 7/04;  d.  multiple caths in past (chronically abnl ECG); e. cardiac cath 3/12: LAD stent patent, distal LAD 40-50%, small ostial D1 80%, ostial D2 60%, OM-1 stent patent (20-30%), proximal RCA 50%, prox- mRCA 40-50%; f. cath 2012 nonobs g. cath 06/2013 s/p STEMI- microvascular dz- treated medically   . History of TIA (transient ischemic attack)   . 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 s/p ICD implantation in 2005. b. Gen change 2006. 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 c. s/p CRT-D implantation 07/2013.  [name] Long term (current) use of anticoagulants   . Paroxysmal atrial tachycardia   . Paroxysmal atrial flutter   . Stroke   . Osteoporosis     Past Surgical History  Procedure Laterality Date  . Back surgery    . Cardiac defibrillator placement  07/2003; 10/2004; 2014  . Lumbar laminectomy/decompression microdiscectomy  10/2001    L5-S1/E-chart  . Knee arthroscopy Right   . Thyroidectomy  1970's  . Total knee arthroplasty Left 11/2003  . X-stop implantation  12/2004    L3-4; L4-5  . Fixation kyphoplasty thoracic spine  07/2007    T3, 4, 6 compression fractures  . Shoulder open rotator cuff repair Right 04/2008  . Lumbar laminectomy/decompression microdiscectomy  01/2010  . Colectomy  03/2010    sigmoid left; transverse  . Peripherally inserted central catheter insertion  03/2010    removed upon discharge  . Colostomy reversal  12/2010  . Tonsillectomy and adenoidectomy  1969  . Appendectomy  03/2010  . Glaucoma surgery Right   . Cardiac defibrillator removal  12/17/2012    removed  for infection  . Icd lead removal Left 12/17/2012    Procedure: ICD LEAD REMOVAL;  Surgeon: Evans Lance, MD;  Location: [place];  Service: Cardiovascular;  Laterality: Left;  . Coronary angioplasty with stent placement  10/2001; 09/2002; ?date    "I've got a total of 4" (04/10/2013)  . Bi-ventricular implantable cardioverter defibrillator  (crt-d)  08/05/2013    STJ Quadra Assura CRTD implanted by Dr Caryl Comes (right sided)    Prior to Admission medications   Medication Sig Start Date End Date Taking? Authorizing Provider  amiodarone (PACERONE) 200 MG tablet Take 200 mg by mouth daily.   Yes Historical Provider, MD  apixaban (ELIQUIS) 5 MG TABS tablet Take 1 tablet (5 mg total) by mouth 2 (two) times daily. 09/02/13  Yes Jolaine Artist, MD  clonazePAM (KLONOPIN) 0.5 MG tablet Take 1 tablet (0.5 mg total) by mouth 2 (two) times daily as needed for anxiety. 09/16/13  Yes Janith Lima, MD  digoxin (LANOXIN) 0.125 MG tablet Take 0.5 tablets (0.0625 mg total) by mouth daily. 06/18/13  Yes Rande Brunt, NP  fexofenadine (ALLEGRA) 180 MG tablet Take 180 mg by mouth daily as needed for allergies.    Yes Historical Provider, MD  fluticasone (FLONASE) 50 MCG/ACT nasal spray Place 2 sprays into both nostrils daily as needed for allergies.    Yes Historical Provider, MD  folic acid-pyridoxine-cyancobalamin (FOLTX) 2.5-25-2 MG TABS Take 1 tablet by mouth daily.    Yes Historical Provider, MD  furosemide (LASIX) 40 MG tablet Take 1 tablet (40 mg total) by mouth daily. 06/11/13  Yes Amy D Clegg, NP  levothyroxine (SYNTHROID, LEVOTHROID) 200 MCG tablet Take 200 mcg by mouth daily before breakfast.    Yes Historical Provider, MD  losartan (COZAAR) 25 MG tablet Take 1 tablet (25 mg total) by mouth daily. 08/21/13  Yes Rande Brunt, NP  nitroGLYCERIN (NITROSTAT) 0.4 MG SL tablet Place 0.4 mg under the tongue every 5 (five) minutes as needed for chest pain.    Yes Historical Provider, MD  omega-3 acid ethyl esters  (LOVAZA) 1 G capsule Take 1 g by mouth daily.   Yes Historical Provider, MD  ondansetron (ZOFRAN-ODT) 4 MG disintegrating tablet Take 4 mg by mouth 2 (two) times daily as needed for nausea or vomiting.   Yes Historical Provider, MD  pantoprazole (PROTONIX) 40 MG tablet Take 1 tablet (40 mg total) by mouth 2 (two) times daily. 07/12/13  Yes Janith Lima, MD  polyethylene glycol [facility] / GLYCOLAX) packet Take 17 g by mouth daily as needed (for constipation).    Yes Historical Provider, MD  raloxifene (EVISTA) 60 MG tablet Take 60 mg by mouth daily.   Yes Historical Provider, MD  spironolactone (ALDACTONE) 25 MG tablet Take 25 mg by mouth daily. 08/07/13  Yes Dayna N Dunn, PA-C    Scheduled Meds: . amiodarone  200 mg Oral Daily  .  apixaban  5 mg Oral BID  . digoxin  0.0625 mg Oral Daily  . feeding supplement (RESOURCE BREEZE)  1 Container Oral BID BM  . folic acid-pyridoxine-cyancobalamin  1 tablet Oral Daily  . furosemide  40 mg Oral Daily  . levothyroxine  200 mcg Oral QAC breakfast  . omega-3 acid ethyl esters  1 g Oral Daily  . pantoprazole  40 mg Oral BID  . raloxifene  60 mg Oral Daily  . spironolactone  25 mg Oral Daily   Continuous Infusions:  PRN Meds:.acetaminophen, clonazePAM, fluticasone, loratadine, nitroGLYCERIN, ondansetron (ZOFRAN) IV  Allergies as of 09/29/2013 - Review Complete 09/29/2013  Allergen Reaction Noted  . Coconut oil Anaphylaxis and Hives 03/11/2013  . Lansoprazole Nausea Only 02/17/2011  . Penicillins Swelling 11/20/2009  . Statins Other (See Comments) 11/20/2009  . Lisinopril  07/18/2012  . Lactose intolerance (gi) Diarrhea and Other (See Comments) 03/03/2012    Family History  Problem Relation Age of Onset  . Diabetes Mother   . Diabetes Brother   . Arthritis      family history  . Prostate cancer      Family History    History   Social History  . Marital Status: Widowed    Spouse Name: N/A    Number of Children: N/A  . Years of  Education: N/A   Occupational History  . Retired    Social History Main Topics  . Smoking status: Former Smoker -- 0.10 packs/day for 15 years    Types: Cigarettes    Quit date: 04/21/2001  . Smokeless tobacco: Never Used     Comment: "smoked ~ 1 pack/month"  . Alcohol Use: 0.6 oz/week    1 Glasses of wine per week  . Drug Use: No  . Sexual Activity: Not Currently   Other Topics Concern  . Not on file   Social History Narrative   Lives alone, has a house, has some household help. Handicapped upfitted.    Review of Systems: All negative from a GI standpoint except as stated above in HPI.  Physical Exam: Vital signs: Filed Vitals:   10/01/13 0726  BP: 112/55  Pulse: 71  Temp: 97.9 F (36.6 C)  Resp: 12   Last BM Date: 09/30/13 General:   Elderly, thin, Alert,  pleasant and cooperative in NAD HEENT: anicteric Lungs:  Clear throughout to auscultation.   No wheezes, crackles, or rhonchi. No acute distress. Heart: irregular rate Abdomen: diffusely tender with voluntary guarding, soft, nondistended, +BS Rectal:  Deferred Ext: no edema  GI:  Lab Results:  Recent Labs  09/29/13 1605 09/29/13 1626 09/30/13 0300  WBC 5.7  --  5.7  HGB 13.5 15.3* 12.0  HCT 38.7 45.0 35.2*  PLT 149*  --  155   BMET  Recent Labs  09/29/13 1626 09/30/13 0300  NA 141 138  K 3.2* 4.1  CL 102 97  CO2  --  26  GLUCOSE 94 102*  BUN 4* 7  CREATININE 0.90 1.00  CALCIUM  --  8.1*   LFT No results found for this basename: PROT, ALBUMIN, AST, ALT, ALKPHOS, BILITOT, BILIDIR, IBILI,  in the last 72 hours PT/INR  Recent Labs  09/29/13 1605  LABPROT 14.1  INR 1.09     Studies/Results: Dg Chest Port 1 View  09/29/2013   CLINICAL DATA:  Chest pain and shortness of breath.  EXAM: PORTABLE CHEST - 1 VIEW  COMPARISON:  09/27/2013  FINDINGS: Right chest wall pacemaker appears stable. Mild  cardiomegaly is stable. Coronary artery stents noted. Atherosclerotic calcification of the  thoracic aortic arch. Normal pulmonary vascularity. Lungs are well expanded clear.  Vertebroplasty changes at 3 levels in the upper thoracic spine appears stable. Partially imaged postsurgical changes of the lumbar spine.  IMPRESSION: Stable mild cardiomegaly.  No acute findings.  The lungs are clear.  Vertebroplasty changes at 3 levels in the thoracic spine.   Electronically Signed   By: Curlene Dolphin M.D.   On: 09/29/2013 15:48    Impression/Plan: 63 yo with persisting epigastric pain and recent N/V and odynophagia. Candida esophagitis could cause this presentation although her heart arrhythmia likely contributed to the N/V and chest pains. Will start Nystatin suspension today. EGD tomorrow to further evaluate for sources of N/V/abdominal pain such as peptic ulcer disease and esophagitis/gastritis. Changing to clears this afternoon in anticipation of EGD tomorrow. NPO after midnight. Mild elevation in LFTs noted on admit and will recheck today. Diarrhea has resolved and C. Diff negative so will d/c enteric contact precautions.    LOS: 2 days   [place] C.  10/01/2013, 9:41 AM

## 2013-10-01 NOTE — Care Management Note (Addendum)
    Page 1 of 1   10/02/2013     9:25:46 AM CARE MANAGEMENT NOTE 10/02/2013  Patient:  [name]   Account Number:  [id number]  Date Initiated:  10/01/2013  Documentation initiated by:  [name]  Subjective/Objective Assessment:   adm w at tach     Action/Plan:   lives alone, pcp dr [name], act w [facility]   Anticipated DC Date:     Anticipated DC Plan:  [place]  CM consult      [facility] Choice  Resumption Of Svcs/PTA Provider   Choice offered to / List presented to:          [facility] arranged  HH-1 RN  [place] [facility]  [facility]   Status of service:   Medicare Important Message given?  YES (If response is "NO", the following Medicare IM given date fields will be blank) Date Medicare IM given:  10/02/2013 Medicare IM given by:  [name] Date Additional Medicare IM given:   Additional Medicare IM given by:    Discharge Disposition:  [facility]  Per UR Regulation:  Reviewed for med. necessity/level of care/duration of stay  If discussed at [place] of Stay Meetings, dates discussed:    Comments:  7/14 1011 Becky Becky Napolitano rn,bsn have alerted [name] w [facility] of pt's adm.

## 2013-10-02 ENCOUNTER — Encounter (HOSPITAL_COMMUNITY): Payer: Self-pay | Admitting: *Deleted

## 2013-10-02 ENCOUNTER — Observation Stay (HOSPITAL_COMMUNITY): Payer: Medicare Other | Admitting: Anesthesiology

## 2013-10-02 ENCOUNTER — Encounter (HOSPITAL_COMMUNITY)
Admission: EM | Disposition: A | Discharge status: Home / Self Care | Payer: Self-pay | Source: Home / Self Care | Attending: Emergency Medicine

## 2013-10-02 ENCOUNTER — Encounter (HOSPITAL_COMMUNITY): Payer: Medicare Other | Admitting: Anesthesiology

## 2013-10-02 DIAGNOSIS — R1013 Epigastric pain: Secondary | ICD-10-CM | POA: Diagnosis present

## 2013-10-02 DIAGNOSIS — N183 Chronic kidney disease, stage 3 unspecified: Secondary | ICD-10-CM | POA: Diagnosis not present

## 2013-10-02 DIAGNOSIS — I471 Supraventricular tachycardia: Secondary | ICD-10-CM | POA: Diagnosis not present

## 2013-10-02 DIAGNOSIS — I129 Hypertensive chronic kidney disease with stage 1 through stage 4 chronic kidney disease, or unspecified chronic kidney disease: Secondary | ICD-10-CM | POA: Diagnosis not present

## 2013-10-02 DIAGNOSIS — R112 Nausea with vomiting, unspecified: Secondary | ICD-10-CM | POA: Diagnosis present

## 2013-10-02 DIAGNOSIS — I2589 Other forms of chronic ischemic heart disease: Secondary | ICD-10-CM | POA: Diagnosis not present

## 2013-10-02 HISTORY — PX: ESOPHAGOGASTRODUODENOSCOPY: SHX5428

## 2013-10-02 SURGERY — EGD (ESOPHAGOGASTRODUODENOSCOPY)
Anesthesia: Monitor Anesthesia Care

## 2013-10-02 MED ORDER — NYSTATIN 100000 UNIT/ML MT SUSP
5.0000 mL | Freq: Four times a day (QID) | OROMUCOSAL | Status: DC
  Administered 2013-10-02 – 2013-10-03 (×3): 500000 [IU] via ORAL
  Filled 2013-10-02 (×6): qty 5

## 2013-10-02 MED ORDER — ONDANSETRON HCL 4 MG/2ML IJ SOLN
INTRAMUSCULAR | Status: AC
  Filled 2013-10-02: qty 2

## 2013-10-02 MED ORDER — BUTAMBEN-TETRACAINE-BENZOCAINE 2-2-14 % EX AERO
INHALATION_SPRAY | CUTANEOUS | Status: DC | PRN
  Administered 2013-10-02: 4 via TOPICAL

## 2013-10-02 MED ORDER — PROPOFOL 10 MG/ML IV BOLUS
INTRAVENOUS | Status: DC | PRN
  Administered 2013-10-02 (×2): 20 mg via INTRAVENOUS
  Administered 2013-10-02: 30 mg via INTRAVENOUS

## 2013-10-02 MED ORDER — FENTANYL CITRATE 0.05 MG/ML IJ SOLN
INTRAMUSCULAR | Status: DC | PRN
  Administered 2013-10-02: 100 ug via INTRAVENOUS

## 2013-10-02 MED ORDER — LIDOCAINE HCL (CARDIAC) 20 MG/ML IV SOLN
INTRAVENOUS | Status: DC | PRN
  Administered 2013-10-02: 50 mg via INTRAVENOUS

## 2013-10-02 MED ORDER — DEXTROSE-NACL 5-0.45 % IV SOLN
INTRAVENOUS | Status: DC
  Administered 2013-10-02 (×2): via INTRAVENOUS

## 2013-10-02 MED ORDER — LACTATED RINGERS IV SOLN
INTRAVENOUS | Status: DC
  Administered 2013-10-02: 1000 mL via INTRAVENOUS

## 2013-10-02 MED ORDER — LACTATED RINGERS IV SOLN
INTRAVENOUS | Status: DC | PRN
  Administered 2013-10-02: 13:00:00 via INTRAVENOUS

## 2013-10-02 NOTE — Progress Notes (Signed)
Report received from [facility], patient to be sent up from endoscopy shortly.  Will continue to monitor.  Dirk Dress 10/02/2013 2:08 PM

## 2013-10-02 NOTE — Transfer of Care (Signed)
Immediate Anesthesia Transfer of Care Note  Patient: [name]  Procedure(s) Performed: Procedure(s): ESOPHAGOGASTRODUODENOSCOPY (EGD) (N/A)  Patient Location: Endoscopy Unit  Anesthesia Type:MAC  Level of Consciousness: awake, alert  and oriented  Airway & Oxygen Therapy: Patient Spontanous Breathing and Patient connected to nasal cannula oxygen  Post-op Assessment: Report given to PACU RN and Post -op Vital signs reviewed and stable  Post vital signs: Reviewed and stable  Complications: No apparent anesthesia complications

## 2013-10-02 NOTE — Progress Notes (Signed)
Utilization review completed.  

## 2013-10-02 NOTE — Progress Notes (Signed)
Gave report to RN on 3W, tx patient to endo for procedure

## 2013-10-02 NOTE — H&P (View-Only) (Signed)
Referring Provider: Dr. Caryl Comes Primary Care Physician:  [name], MD Primary Gastroenterologist:  Dr. Michail Sermon  Reason for Consultation:  Nausea and Vomiting; Epigastric pain  HPI: [name] is a 63 y.o. female with multiple medical problems as stated below who is s/p a left hemicolectomy due to diverticular disease being seen for a consult due to N/V/epigastric pain/diarrhea. Reports diarrhea started last Thursday and has resolved stating she had a solid stool this morning. Also, C. Diff negative. Abdominal pain/N/V worsened last week and came to hospital and CT unrevealing. Sent home and returned during weekend with chest pain and found to be in Afib. Reports recent defibrillator change in May. Reports chronic abdominal pain since hemicolectomy but pain worsened last week and was mainly in the epigastric and substernal area. Unable to keep any POs down last Friday and over the weekend. Currently able to keep POs down but feels pain with swallowing and reports sensation that food hangs up at times. Denies hematemesis, hematochezia, or melena. EGD in 2013 showed gastritis and esophagitis with negative brushing. Feels somewhat better in that N/V has improved and diarrhea has resolved.   Past Medical History  Diagnosis Date  . Spinal stenosis   . Numbness of foot   . Diverticulitis     a. s/p partial colectomy 1/12 with reversal in July 2012.  . Colitis, ischemic   . DJD (degenerative joint disease)   . Ischemic cardiomyopathy     a. Echo 06/16/10: EF 25-30%, anteroseptal and apical hypokinesis, moderate AI, mild MR.   . LV (left ventricular) mural thrombus   . CKD (chronic kidney disease), stage III   . GERD (gastroesophageal reflux disease)   . Hypertension   . Hypothyroidism   . Hyperlipidemia   . Compression fracture     a. of L2  . 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 2/2 atrial fibrillation with RVR in the context of hypokalemia  .  PAF (paroxysmal atrial fibrillation)     a. on Eliquis, digoxin, and amiodarone  . Anemia   . H/O hiatal hernia   . Sinus bradycardia   . Mitral regurgitation   . CHF (congestive heart failure)   . CAD (coronary artery disease)     a.  Ant MI 8/03 with stenting to LAD;  b. staged PCI w/ DES to OM1 8/03;  c. s/p DES to LAD 7/04;  d.  multiple caths in past (chronically abnl ECG); e. cardiac cath 3/12: LAD stent patent, distal LAD 40-50%, small ostial D1 80%, ostial D2 60%, OM-1 stent patent (20-30%), proximal RCA 50%, prox- mRCA 40-50%; f. cath 2012 nonobs g. cath 06/2013 s/p STEMI- microvascular dz- treated medically   . History of TIA (transient ischemic attack)   . 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 s/p ICD implantation in 2005. b. Gen change 2006. 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 c. s/p CRT-D implantation 07/2013.  [name] Long term (current) use of anticoagulants   . Paroxysmal atrial tachycardia   . Paroxysmal atrial flutter   . Stroke   . Osteoporosis     Past Surgical History  Procedure Laterality Date  . Back surgery    . Cardiac defibrillator placement  07/2003; 10/2004; 2014  . Lumbar laminectomy/decompression microdiscectomy  10/2001    L5-S1/E-chart  . Knee arthroscopy Right   . Thyroidectomy  1970's  . Total knee arthroplasty Left 11/2003  . X-stop implantation  12/2004    L3-4; L4-5  . Fixation kyphoplasty thoracic spine  07/2007    T3, 4, 6 compression fractures  . Shoulder open rotator cuff repair Right 04/2008  . Lumbar laminectomy/decompression microdiscectomy  01/2010  . Colectomy  03/2010    sigmoid left; transverse  . Peripherally inserted central catheter insertion  03/2010    removed upon discharge  . Colostomy reversal  12/2010  . Tonsillectomy and adenoidectomy  1969  . Appendectomy  03/2010  . Glaucoma surgery Right   . Cardiac defibrillator removal  12/17/2012    removed  for infection  . Icd lead removal Left 12/17/2012    Procedure: ICD LEAD REMOVAL;  Surgeon: Evans Lance, MD;  Location: [place];  Service: Cardiovascular;  Laterality: Left;  . Coronary angioplasty with stent placement  10/2001; 09/2002; ?date    "I've got a total of 4" (04/10/2013)  . Bi-ventricular implantable cardioverter defibrillator  (crt-d)  08/05/2013    STJ Quadra Assura CRTD implanted by Dr Caryl Comes (right sided)    Prior to Admission medications   Medication Sig Start Date End Date Taking? Authorizing Provider  amiodarone (PACERONE) 200 MG tablet Take 200 mg by mouth daily.   Yes Historical Provider, MD  apixaban (ELIQUIS) 5 MG TABS tablet Take 1 tablet (5 mg total) by mouth 2 (two) times daily. 09/02/13  Yes Jolaine Artist, MD  clonazePAM (KLONOPIN) 0.5 MG tablet Take 1 tablet (0.5 mg total) by mouth 2 (two) times daily as needed for anxiety. 09/16/13  Yes Janith Lima, MD  digoxin (LANOXIN) 0.125 MG tablet Take 0.5 tablets (0.0625 mg total) by mouth daily. 06/18/13  Yes Rande Brunt, NP  fexofenadine (ALLEGRA) 180 MG tablet Take 180 mg by mouth daily as needed for allergies.    Yes Historical Provider, MD  fluticasone (FLONASE) 50 MCG/ACT nasal spray Place 2 sprays into both nostrils daily as needed for allergies.    Yes Historical Provider, MD  folic acid-pyridoxine-cyancobalamin (FOLTX) 2.5-25-2 MG TABS Take 1 tablet by mouth daily.    Yes Historical Provider, MD  furosemide (LASIX) 40 MG tablet Take 1 tablet (40 mg total) by mouth daily. 06/11/13  Yes Amy D Clegg, NP  levothyroxine (SYNTHROID, LEVOTHROID) 200 MCG tablet Take 200 mcg by mouth daily before breakfast.    Yes Historical Provider, MD  losartan (COZAAR) 25 MG tablet Take 1 tablet (25 mg total) by mouth daily. 08/21/13  Yes Rande Brunt, NP  nitroGLYCERIN (NITROSTAT) 0.4 MG SL tablet Place 0.4 mg under the tongue every 5 (five) minutes as needed for chest pain.    Yes Historical Provider, MD  omega-3 acid ethyl esters  (LOVAZA) 1 G capsule Take 1 g by mouth daily.   Yes Historical Provider, MD  ondansetron (ZOFRAN-ODT) 4 MG disintegrating tablet Take 4 mg by mouth 2 (two) times daily as needed for nausea or vomiting.   Yes Historical Provider, MD  pantoprazole (PROTONIX) 40 MG tablet Take 1 tablet (40 mg total) by mouth 2 (two) times daily. 07/12/13  Yes Janith Lima, MD  polyethylene glycol [facility] / GLYCOLAX) packet Take 17 g by mouth daily as needed (for constipation).    Yes Historical Provider, MD  raloxifene (EVISTA) 60 MG tablet Take 60 mg by mouth daily.   Yes Historical Provider, MD  spironolactone (ALDACTONE) 25 MG tablet Take 25 mg by mouth daily. 08/07/13  Yes Dayna N Dunn, PA-C    Scheduled Meds: . amiodarone  200 mg Oral Daily  .  apixaban  5 mg Oral BID  . digoxin  0.0625 mg Oral Daily  . feeding supplement (RESOURCE BREEZE)  1 Container Oral BID BM  . folic acid-pyridoxine-cyancobalamin  1 tablet Oral Daily  . furosemide  40 mg Oral Daily  . levothyroxine  200 mcg Oral QAC breakfast  . omega-3 acid ethyl esters  1 g Oral Daily  . pantoprazole  40 mg Oral BID  . raloxifene  60 mg Oral Daily  . spironolactone  25 mg Oral Daily   Continuous Infusions:  PRN Meds:.acetaminophen, clonazePAM, fluticasone, loratadine, nitroGLYCERIN, ondansetron (ZOFRAN) IV  Allergies as of 09/29/2013 - Review Complete 09/29/2013  Allergen Reaction Noted  . Coconut oil Anaphylaxis and Hives 03/11/2013  . Lansoprazole Nausea Only 02/17/2011  . Penicillins Swelling 11/20/2009  . Statins Other (See Comments) 11/20/2009  . Lisinopril  07/18/2012  . Lactose intolerance (gi) Diarrhea and Other (See Comments) 03/03/2012    Family History  Problem Relation Age of Onset  . Diabetes Mother   . Diabetes Brother   . Arthritis      family history  . Prostate cancer      Family History    History   Social History  . Marital Status: Widowed    Spouse Name: N/A    Number of Children: N/A  . Years of  Education: N/A   Occupational History  . Retired    Social History Main Topics  . Smoking status: Former Smoker -- 0.10 packs/day for 15 years    Types: Cigarettes    Quit date: 04/21/2001  . Smokeless tobacco: Never Used     Comment: "smoked ~ 1 pack/month"  . Alcohol Use: 0.6 oz/week    1 Glasses of wine per week  . Drug Use: No  . Sexual Activity: Not Currently   Other Topics Concern  . Not on file   Social History Narrative   Lives alone, has a house, has some household help. Handicapped upfitted.    Review of Systems: All negative from a GI standpoint except as stated above in HPI.  Physical Exam: Vital signs: Filed Vitals:   10/01/13 0726  BP: 112/55  Pulse: 71  Temp: 97.9 F (36.6 C)  Resp: 12   Last BM Date: 09/30/13 General:   Elderly, thin, Alert,  pleasant and cooperative in NAD HEENT: anicteric Lungs:  Clear throughout to auscultation.   No wheezes, crackles, or rhonchi. No acute distress. Heart: irregular rate Abdomen: diffusely tender with voluntary guarding, soft, nondistended, +BS Rectal:  Deferred Ext: no edema  GI:  Lab Results:  Recent Labs  09/29/13 1605 09/29/13 1626 09/30/13 0300  WBC 5.7  --  5.7  HGB 13.5 15.3* 12.0  HCT 38.7 45.0 35.2*  PLT 149*  --  155   BMET  Recent Labs  09/29/13 1626 09/30/13 0300  NA 141 138  K 3.2* 4.1  CL 102 97  CO2  --  26  GLUCOSE 94 102*  BUN 4* 7  CREATININE 0.90 1.00  CALCIUM  --  8.1*   LFT No results found for this basename: PROT, ALBUMIN, AST, ALT, ALKPHOS, BILITOT, BILIDIR, IBILI,  in the last 72 hours PT/INR  Recent Labs  09/29/13 1605  LABPROT 14.1  INR 1.09     Studies/Results: Dg Chest Port 1 View  09/29/2013   CLINICAL DATA:  Chest pain and shortness of breath.  EXAM: PORTABLE CHEST - 1 VIEW  COMPARISON:  09/27/2013  FINDINGS: Right chest wall pacemaker appears stable. Mild  cardiomegaly is stable. Coronary artery stents noted. Atherosclerotic calcification of the  thoracic aortic arch. Normal pulmonary vascularity. Lungs are well expanded clear.  Vertebroplasty changes at 3 levels in the upper thoracic spine appears stable. Partially imaged postsurgical changes of the lumbar spine.  IMPRESSION: Stable mild cardiomegaly.  No acute findings.  The lungs are clear.  Vertebroplasty changes at 3 levels in the thoracic spine.   Electronically Signed   By: Curlene Dolphin M.D.   On: 09/29/2013 15:48    Impression/Plan: 63 yo with persisting epigastric pain and recent N/V and odynophagia. Candida esophagitis could cause this presentation although her heart arrhythmia likely contributed to the N/V and chest pains. Will start Nystatin suspension today. EGD tomorrow to further evaluate for sources of N/V/abdominal pain such as peptic ulcer disease and esophagitis/gastritis. Changing to clears this afternoon in anticipation of EGD tomorrow. NPO after midnight. Mild elevation in LFTs noted on admit and will recheck today. Diarrhea has resolved and C. Diff negative so will d/c enteric contact precautions.    LOS: 2 days   [place] C.  10/01/2013, 9:41 AM

## 2013-10-02 NOTE — Progress Notes (Signed)
Report received from [facility] at 2 Heart.  Patient to be taken to Endo for EGD then transferred to 3 West bed 4.  Dirk Dress 10/02/2013 11:35 AM

## 2013-10-02 NOTE — Anesthesia Postprocedure Evaluation (Signed)
  Anesthesia Post-op Note  Patient: [name]  Procedure(s) Performed: Procedure(s): ESOPHAGOGASTRODUODENOSCOPY (EGD) (N/A)  Patient Location: PACU  Anesthesia Type:MAC  Level of Consciousness: awake, oriented, sedated and patient cooperative  Airway and Oxygen Therapy: Patient Spontanous Breathing  Post-op Pain: none  Post-op Assessment: Post-op Vital signs reviewed, Patient's Cardiovascular Status Stable, Respiratory Function Stable, Patent Airway, No signs of Nausea or vomiting and Pain level controlled  Post-op Vital Signs: stable  Last Vitals:  Filed Vitals:   10/02/13 1350  BP: 124/70  Pulse: 70  Temp:   Resp: 14    Complications: No apparent anesthesia complications

## 2013-10-02 NOTE — Brief Op Note (Addendum)
Minimal gastritis and mild oral thrush otherwise normal EGD. Resume Nystatin swish and swallow QID for 7 days. Start POs. Ok to go home from GI standpoint if she can tolerate POs. F/U with me in 3-4 weeks. Will sign off. Call if questions.

## 2013-10-02 NOTE — Op Note (Signed)
[facility] [place], [address]   ENDOSCOPY PROCEDURE REPORT  PATIENT: Becky, [name]  MR#: [id number] BIRTHDATE: 07/11/1950 , 62  yrs. old GENDER: Female  ENDOSCOPIST: Wilford Corner, MD REFERRED GB:[id number] team  PROCEDURE DATE:  10/02/2013 PROCEDURE:   EGD, diagnostic ASA CLASS:   Class III INDICATIONS:Epigastric pain.   Nausea.   Vomiting. MEDICATIONS: See Anesthesia Report.  TOPICAL ANESTHETIC:  DESCRIPTION OF PROCEDURE:   After the risks benefits and alternatives of the procedure were thoroughly explained, informed consent was obtained.  The Pentax Gastroscope [id number]  endoscope was introduced through the mouth and advanced to the second portion of the duodenum , limited by Without limitations.   The instrument was slowly withdrawn as the mucosa was fully examined.     FINDINGS: The endoscope was inserted into the oropharynx and esophagus was intubated.  The gastroesophageal junction was noted to be 42 cm from the incisors. The esophagus was normal in appearance.  Endoscope was advanced into the stomach, which revealed a minimal amount of erythema in the distal stomach consistent with minimal gastritis. The endoscope was advanced to the duodenal bulb and second portion of duodenum which were unremarkable.  The endoscope was withdrawn back into the stomach and retroflexion revealed a normal proximal stomach. During withdrawal there were white plaques noted on the tongue especially in the back part that appear to be thrush although recent Cetacaine spray could also cause this appearance.  COMPLICATIONS: None  ENDOSCOPIC IMPRESSION:     Mild oral thrush; Minimal antral gastritis  RECOMMENDATIONS: Patient reports that initiation of Nystatin yesterday helped her symptoms so would continue that regimen for 7 days; PPI QD; Follow up with me in 3-4 weeks.   REPEAT EXAM: N/A  _______________________________ Wilford Corner, MD eSigned:  Wilford Corner, MD 10/02/2013 2:01 PM    CC:  PATIENT NAME:  Becky, [name] MR#: [id number]

## 2013-10-02 NOTE — Anesthesia Preprocedure Evaluation (Signed)
Anesthesia Evaluation  Patient identified by MRN, date of birth, ID band Patient awake    Reviewed: Allergy & Precautions, H&P , NPO status , Patient's Chart, lab work & pertinent test results  Airway       Dental   Pulmonary former smoker,          Cardiovascular hypertension, + CAD, + Peripheral Vascular Disease and +CHF + Cardiac Defibrillator     Neuro/Psych CVA, No Residual Symptoms    GI/Hepatic hiatal hernia, GERD-  ,  Endo/Other  Hypothyroidism   Renal/GU Renal InsufficiencyRenal disease     Musculoskeletal   Abdominal   Peds  Hematology  (+) anemia ,   Anesthesia Other Findings   Reproductive/Obstetrics                           Anesthesia Physical Anesthesia Plan  ASA: III  Anesthesia Plan: MAC   Post-op Pain Management:    Induction: Intravenous  Airway Management Planned: Mask  Additional Equipment:   Intra-op Plan:   Post-operative Plan:   Informed Consent: I have reviewed the patients History and Physical, chart, labs and discussed the procedure including the risks, benefits and alternatives for the proposed anesthesia with the patient or authorized representative who has indicated his/her understanding and acceptance.     Plan Discussed with:   Anesthesia Plan Comments:         Anesthesia Quick Evaluation

## 2013-10-02 NOTE — Interval H&P Note (Signed)
History and Physical Interval Note:  10/02/2013 1:26 PM  [name]  has presented today for surgery, with the diagnosis of [place] and vomiting  The various methods of treatment have been discussed with the patient and family. After consideration of risks, benefits and other options for treatment, the patient has consented to  Procedure(s): ESOPHAGOGASTRODUODENOSCOPY (EGD) (N/A) as a surgical intervention .  The patient's history has been reviewed, patient examined, no change in status, stable for surgery.  I have reviewed the patient's chart and labs.  Questions were answered to the patient's satisfaction.     Welch C.

## 2013-10-02 NOTE — Progress Notes (Signed)
AT 1600 pt in bed watching TV.  Introduced self to pt as Soil scientist 3p-7p.  Call bell at reach & denies pain.  Instructed to call for assist as needed.  Verbalized understanding.  Will continue to monitor.  Karie Kirks, Therapist, sports.

## 2013-10-02 NOTE — Progress Notes (Signed)
Patient Name: [name]      SUBJECTIVE appreciate GI help  For GED today Has reverted to sinus Plan is for subsequent ablation  Past Medical History  Diagnosis Date  . Spinal stenosis   . Numbness of foot   . Diverticulitis     a. s/p partial colectomy 1/12 with reversal in July 2012.  . Colitis, ischemic   . DJD (degenerative joint disease)   . Ischemic cardiomyopathy     a. Echo 06/16/10: EF 25-30%, anteroseptal and apical hypokinesis, moderate AI, mild MR.   . LV (left ventricular) mural thrombus   . CKD (chronic kidney disease), stage III   . GERD (gastroesophageal reflux disease)   . Hypertension   . Hypothyroidism   . Hyperlipidemia   . Compression fracture     a. of L2  . 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 2/2 atrial fibrillation with RVR in the context of hypokalemia  . PAF (paroxysmal atrial fibrillation)     a. on Eliquis, digoxin, and amiodarone  . Anemia   . H/O hiatal hernia   . Sinus bradycardia   . Mitral regurgitation   . CHF (congestive heart failure)   . CAD (coronary artery disease)     a.  Ant MI 8/03 with stenting to LAD;  b. staged PCI w/ DES to OM1 8/03;  c. s/p DES to LAD 7/04;  d.  multiple caths in past (chronically abnl ECG); e. cardiac cath 3/12: LAD stent patent, distal LAD 40-50%, small ostial D1 80%, ostial D2 60%, OM-1 stent patent (20-30%), proximal RCA 50%, prox- mRCA 40-50%; f. cath 2012 nonobs g. cath 06/2013 s/p STEMI- microvascular dz- treated medically   . History of TIA (transient ischemic attack)   . 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 s/p ICD implantation in 2005. b. Gen change 2006. 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 c. s/p CRT-D implantation 07/2013.  [name] Long term (current) use of anticoagulants   . Paroxysmal atrial tachycardia   . Paroxysmal atrial flutter   . Stroke   .  Osteoporosis     Scheduled Meds:  Scheduled Meds: . amiodarone  200 mg Oral Daily  . apixaban  5 mg Oral BID  . carvedilol  3.125 mg Oral BID WC  . digoxin  0.0625 mg Oral Daily  . feeding supplement (RESOURCE BREEZE)  1 Container Oral BID BM  . folic acid-pyridoxine-cyancobalamin  1 tablet Oral Daily  . furosemide  40 mg Oral Daily  . levothyroxine  200 mcg Oral QAC breakfast  . omega-3 acid ethyl esters  1 g Oral Daily  . pantoprazole  40 mg Oral BID  . raloxifene  60 mg Oral Daily  . spironolactone  25 mg Oral Daily   Continuous Infusions:  acetaminophen, clonazePAM, fluticasone, loratadine, nitroGLYCERIN, ondansetron (ZOFRAN) IV    PHYSICAL EXAM Filed Vitals:   10/01/13 2300 10/02/13 0000 10/02/13 0300 10/02/13 0400  BP: 123/59 111/49 114/61 109/78  Pulse: 74  73   Temp: 98.1 F (36.7 C)  98.3 F (36.8 C)   TempSrc: Oral  Oral   Resp: 21 14 16 15   Height:      Weight:   127 lb 3.2 oz (57.698 kg)   SpO2: 97%  98%     Well developed and nourished in no acute distress HENT normal Neck supple with JVP-flat Clear  Regular rate and rhythm, no murmurs or gallops Abd-soft with active BS No Clubbing cyanosis edema Skin-warm and dry A & Oriented  Grossly normal sensory and motor function   TELEMETRY: Reviewed telemetry pt in Apcing:    Intake/Output Summary (Last 24 hours) at 10/02/13 0809 Last data filed at 10/02/13 0600  Gross per 24 hour  Intake    720 ml  Output    850 ml  Net   -130 ml    LABS: Basic Metabolic Panel:  Recent Labs Lab 09/27/13 1216 09/29/13 1605 09/29/13 1626 09/30/13 0300  NA 138  --  141 138  K 5.7*  --  3.2* 4.1  CL 97  --  102 97  CO2 22  --   --  26  GLUCOSE 89  --  94 102*  BUN 8  --  4* 7  CREATININE 0.80  --  0.90 1.00  CALCIUM 8.5  --   --  8.1*  MG  --  1.8  --   --    Cardiac Enzymes: No results found for this basename: CKTOTAL, CKMB, CKMBINDEX, TROPONINI,  in the last 72 hours CBC:  Recent Labs Lab  09/27/13 1216 09/29/13 1605 09/29/13 1626 09/30/13 0300  WBC 5.2 5.7  --  5.7  NEUTROABS 3.2 4.2  --   --   HGB 13.3 13.5 15.3* 12.0  HCT 38.9 38.7 45.0 35.2*  MCV 99.5 98.0  --  100.6*  PLT 153 149*  --  155   PROTIME:  Recent Labs  09/29/13 1605  LABPROT 14.1  INR 1.09   Liver Function Tests:  Recent Labs  10/01/13 1100  AST 52*  ALT 23  ALKPHOS 58  BILITOT 0.8  PROT 7.2  ALBUMIN 3.2*   No results found for this basename: LIPASE, AMYLASE,  in the last 72 hours BNP: BNP (last 3 results)  Recent Labs  04/22/13 1200 06/04/13 0957 06/18/13 1327  PROBNP 754.3* 2458.0* 1223.0*       ASSESSMENT AND PLAN:  Principal Problem:   Refractory paroxysmal atrial tachycardia Active Problems:   Ischemic cardiomyopathy   CAD (coronary artery disease)   AICD (automatic cardioverter/defibrillator) present   Pulmonary hypertension   Current use of long term anticoagulation   Atrial fibrillation  rhtyhm stable   GI to do endoscopy today And will plan discharge this afternoon or in am dpending on dr Michail Sermon recommendation IV fluids Transfer to telemetry Signed, Virl Axe MD  10/02/2013

## 2013-10-03 ENCOUNTER — Encounter (HOSPITAL_COMMUNITY): Payer: Self-pay | Admitting: Gastroenterology

## 2013-10-03 DIAGNOSIS — I471 Supraventricular tachycardia: Secondary | ICD-10-CM | POA: Diagnosis not present

## 2013-10-03 MED ORDER — CARVEDILOL 3.125 MG PO TABS: 3.1250 mg | ORAL_TABLET | Freq: Two times a day (BID) | ORAL | Status: DC

## 2013-10-03 MED ORDER — NYSTATIN 100000 UNIT/ML MT SUSP: 5.0000 mL | Freq: Four times a day (QID) | OROMUCOSAL | Status: DC

## 2013-10-03 NOTE — Discharge Instructions (Signed)
Thrush, Adult  Ritta Slot is an infection that can happen on the mouth, throat, tongue, or other areas. It causes white patches to form on the mouth and tongue. HOME CARE  Only take medicine as told by your doctor. You may be given medicine to swallow or to apply right on the area.  Eat plain yogurt that contains live cultures (check the label).  Rinse your mouth many times a day with a warm saltwater rinse. To make the rinse, mix 1 teaspoon (6 g) of salt in 8 ounces (0.2 L) of warm water. To reduce pain:  Drink cold liquids such as water or iced tea.  Eat frozen ice pops or frozen juices.  Eat foods that are easy to swallow, such as gelatin or ice cream.  Drink from a straw if the patches are painful. If you are breastfeeding:  Clean your nipples with an antifungal medicine.  Dry your nipples after breastfeeding.  Use an ointment called lanolin to help relieve nipple soreness. If you wear dentures:  Take out your dentures before going to bed.  Brush them thoroughly.  Soak them in a denture cleaner. GET HELP IF:   Your problems are getting worse.  Your problems are not improving within 7 days of starting treatment.  Your infection is spreading. This may show as white patches on the skin outside of your mouth.  You are nursing and have redness and pain in the nipples. MAKE SURE YOU:  Understand these instructions.  Will watch your condition.  Will get help right away if you are not doing well or get worse. Document Released: 06/01/2009 Document Revised: 12/26/2012 Document Reviewed: 10/08/2012 [facility] Patient Information 2015 [place], [place]. This information is not intended to replace advice given to you by your health care provider. Make sure you discuss any questions you have with your health care provider.   Cardiac Diet This diet can help prevent heart disease and stroke. Many factors influence your heart health, including eating and exercise habits. Coronary  risk rises a lot with abnormal blood fat (lipid) levels. Cardiac meal planning includes limiting unhealthy fats, increasing healthy fats, and making other small dietary changes. General guidelines are as follows:  Adjust calorie intake to reach and maintain desirable body weight.  Limit total fat intake to less than 30% of total calories. Saturated fat should be less than 7% of calories.  Saturated fats are found in animal products and in some vegetable products. Saturated vegetable fats are found in coconut oil, cocoa butter, palm oil, and palm kernel oil. Read labels carefully to avoid these products as much as possible. Use butter in moderation. Choose tub margarines and oils that have 2 grams of fat or less. Good cooking oils are canola and olive oils.  Practice low-fat cooking techniques. Do not fry food. Instead, broil, bake, boil, steam, grill, roast on a rack, stir-fry, or microwave it. Other fat reducing suggestions include:  Remove the skin from poultry.  Remove all visible fat from meats.  Skim the fat off stews, soups, and gravies before serving them.  Steam vegetables in water or broth instead of sauting them in fat.  Avoid foods with trans fat (or hydrogenated oils), such as commercially fried foods and commercially baked goods. Commercial shortening and deep-frying fats will contain trans fat.  Increase intake of fruits, vegetables, whole grains, and legumes to replace foods high in fat.  Increase consumption of nuts, legumes, and seeds to at least 4 servings weekly. One serving of a legume  equals  cup, and 1 serving of nuts or seeds equals  cup.  Choose whole grains more often. Have 3 servings per day (a serving is 1 ounce [oz]).  Eat 4 to 5 servings of vegetables per day. A serving of vegetables is 1 cup of raw leafy vegetables;  cup of raw or cooked cut-up vegetables;  cup of vegetable juice.  Eat 4 to 5 servings of fruit per day. A serving of fruit is 1 medium  whole fruit;  cup of dried fruit;  cup of fresh, frozen, or canned fruit;  cup of 100% fruit juice.  Increase your intake of dietary fiber to 20 to 30 grams per day. Insoluble fiber may help lower your risk of heart disease and may help curb your appetite.  Soluble fiber binds cholesterol to be removed from the blood. Foods high in soluble fiber are dried beans, citrus fruits, oats, apples, bananas, broccoli, Brussels sprouts, and eggplant.  Try to include foods fortified with plant sterols or stanols, such as yogurt, breads, juices, or margarines. Choose several fortified foods to achieve a daily intake of 2 to 3 grams of plant sterols or stanols.  Foods with omega-3 fats can help reduce your risk of heart disease. Aim to have a 3.5 oz portion of fatty fish twice per week, such as salmon, mackerel, albacore tuna, sardines, lake trout, or herring. If you wish to take a fish oil supplement, choose one that contains 1 gram of both DHA and EPA.  Limit processed meats to 2 servings (3 oz portion) weekly.  Limit the sodium in your diet to 1500 milligrams (mg) per day. If you have high blood pressure, talk to a registered dietitian about a DASH (Dietary Approaches to Stop Hypertension) eating plan.  Limit sweets and beverages with added sugar, such as soda, to no more than 5 servings per week. One serving is:   1 tablespoon sugar.  1 tablespoon jelly or jam.   cup sorbet.  1 cup lemonade.   cup regular soda. CHOOSING FOODS Starches  Allowed: Breads: All kinds (wheat, rye, raisin, white, oatmeal, [place], [place], and English muffin bread). Low-fat rolls: English muffins, frankfurter and hamburger buns, bagels, pita bread, tortillas (not fried). Pancakes, waffles, biscuits, and muffins made with recommended oil.  Avoid: Products made with saturated or trans fats, oils, or whole milk products. Butter rolls, cheese breads, croissants. Commercial doughnuts, muffins, sweet rolls, biscuits,  waffles, pancakes, store-bought mixes. Crackers  Allowed: Low-fat crackers and snacks: Animal, graham, rye, saltine (with recommended oil, no lard), oyster, and matzo crackers. Bread sticks, melba toast, rusks, flatbread, pretzels, and light popcorn.  Avoid: High-fat crackers: cheese crackers, butter crackers, and those made with coconut, palm oil, or trans fat (hydrogenated oils). Buttered popcorn. Cereals  Allowed: Hot or cold whole-grain cereals.  Avoid: Cereals containing coconut, hydrogenated vegetable fat, or animal fat. Potatoes / Pasta / Rice  Allowed: All kinds of potatoes, rice, and pasta (such as macaroni, spaghetti, and noodles).  Avoid: Pasta or rice prepared with cream sauce or high-fat cheese. Chow mein noodles, [place] fries. Vegetables  Allowed: All vegetables and vegetable juices.  Avoid: Fried vegetables. Vegetables in cream, butter, or high-fat cheese sauces. Limit coconut. Fruit in cream or custard. Protein  Allowed: Limit your intake of meat, seafood, and poultry to no more than 6 oz (cooked weight) per day. All lean, well-trimmed beef, veal, pork, and lamb. All chicken and [place] without skin. All fish and shellfish. Wild game: wild duck, rabbit, pheasant, and  venison. Egg whites or low-cholesterol egg substitutes may be used as desired. Meatless dishes: recipes with dried beans, peas, lentils, and tofu (soybean curd). Seeds and nuts: all seeds and most nuts.  Avoid: Prime grade and other heavily marbled and fatty meats, such as short ribs, spare ribs, rib eye roast or steak, frankfurters, sausage, bacon, and high-fat luncheon meats, mutton. Caviar. Commercially fried fish. Domestic duck, goose, venison sausage. Organ meats: liver, gizzard, heart, chitterlings, brains, kidney, sweetbreads. Dairy  Allowed: Low-fat cheeses: nonfat or low-fat cottage cheese (1% or 2% fat), cheeses made with part skim milk, such as mozzarella, farmers, string, or ricotta. (Cheeses should  be labeled no more than 2 to 6 grams fat per oz.). Skim (or 1%) milk: liquid, powdered, or evaporated. Buttermilk made with low-fat milk. Drinks made with skim or low-fat milk or cocoa. Chocolate milk or cocoa made with skim or low-fat (1%) milk. Nonfat or low-fat yogurt.  Avoid: Whole milk cheeses, including colby, cheddar, [place], Monterey Jack, [place], [place], [place], American, Swiss, and blue. Creamed cottage cheese, cream cheese. Whole milk and whole milk products, including buttermilk or yogurt made from whole milk, drinks made from whole milk. Condensed milk, evaporated whole milk, and 2% milk. Soups and Combination Foods  Allowed: Low-fat low-sodium soups: broth, dehydrated soups, homemade broth, soups with the fat removed, homemade cream soups made with skim or low-fat milk. Low-fat spaghetti, lasagna, chili, and Spanish rice if low-fat ingredients and low-fat cooking techniques are used.  Avoid: Cream soups made with whole milk, cream, or high-fat cheese. All other soups. Desserts and Sweets  Allowed: Sherbet, fruit ices, gelatins, meringues, and angel food cake. Homemade desserts with recommended fats, oils, and milk products. Jam, jelly, honey, marmalade, sugars, and syrups. Pure sugar candy, such as gum drops, hard candy, jelly beans, marshmallows, mints, and small amounts of dark chocolate.  Avoid: Commercially prepared cakes, pies, cookies, frosting, pudding, or mixes for these products. Desserts containing whole milk products, chocolate, coconut, lard, palm oil, or palm kernel oil. Ice cream or ice cream drinks. Candy that contains chocolate, coconut, butter, hydrogenated fat, or unknown ingredients. Buttered syrups. Fats and Oils  Allowed: Vegetable oils: safflower, sunflower, corn, soybean, cottonseed, sesame, canola, olive, or peanut. Non-hydrogenated margarines. Salad dressing or mayonnaise: homemade or commercial, made with a recommended oil. Low or nonfat salad dressing or  mayonnaise.  Limit added fats and oils to 6 to 8 tsp per day (includes fats used in cooking, baking, salads, and spreads on bread). Remember to count the "hidden fats" in foods.  Avoid: Solid fats and shortenings: butter, lard, salt pork, bacon drippings. Gravy containing meat fat, shortening, or suet. Cocoa butter, coconut. Coconut oil, palm oil, palm kernel oil, or hydrogenated oils: these ingredients are often used in bakery products, nondairy creamers, whipped toppings, candy, and commercially fried foods. Read labels carefully. Salad dressings made of unknown oils, sour cream, or cheese, such as blue cheese and Roquefort. Cream, all kinds: half-and-half, light, heavy, or whipping. Sour cream or cream cheese (even if "light" or low-fat). Nondairy cream substitutes: coffee creamers and sour cream substitutes made with palm, palm kernel, hydrogenated oils, or coconut oil. Beverages  Allowed: Coffee (regular or decaffeinated), tea. Diet carbonated beverages, mineral water. Alcohol: Check with your caregiver. Moderation is recommended.  Avoid: Whole milk, regular sodas, and juice drinks with added sugar. Condiments  Allowed: All seasonings and condiments. Cocoa powder. "Cream" sauces made with recommended ingredients.  Avoid: Carob powder made with hydrogenated fats. SAMPLE MENU Breakfast  cup orange juice   cup oatmeal  1 slice toast  1 tsp margarine  1 cup skim milk Lunch  [place] sandwich with 2 oz [place], 2 slices bread  Lettuce and tomato slices  Fresh fruit  Carrot sticks  Coffee or tea Snack  Fresh fruit or low-fat crackers Dinner  3 oz lean ground beef  1 baked potato  1 tsp margarine   cup asparagus  Lettuce salad  1 tbs non-creamy dressing   cup peach slices  1 cup skim milk Document Released: 12/15/2007 Document Revised: 09/06/2011 Document Reviewed: 05/31/2011 [employer] Patient Information 2015 [place], [place]. This information is not intended  to replace advice given to you by your health care provider. Make sure you discuss any questions you have with your health care provider.

## 2013-10-03 NOTE — Progress Notes (Signed)
Utilization review completed.  

## 2013-10-03 NOTE — Progress Notes (Signed)
Patient Name: [name]      SUBJECTIVE appreciate GI help EGD  Mild candida  nsystatin swisch and swallow for 7 days  Has reverted to sinus Plan is for subsequent ablation  Past Medical History  Diagnosis Date  . Spinal stenosis   . Numbness of foot   . Diverticulitis     a. s/p partial colectomy 1/12 with reversal in July 2012.  . Colitis, ischemic   . DJD (degenerative joint disease)   . Ischemic cardiomyopathy     a. Echo 06/16/10: EF 25-30%, anteroseptal and apical hypokinesis, moderate AI, mild MR.   . LV (left ventricular) mural thrombus   . CKD (chronic kidney disease), stage III   . GERD (gastroesophageal reflux disease)   . Hypertension   . Hypothyroidism   . Hyperlipidemia   . Compression fracture     a. of L2  . 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 2/2 atrial fibrillation with RVR in the context of hypokalemia  . PAF (paroxysmal atrial fibrillation)     a. on Eliquis, digoxin, and amiodarone  . Anemia   . H/O hiatal hernia   . Sinus bradycardia   . Mitral regurgitation   . CHF (congestive heart failure)   . CAD (coronary artery disease)     a.  Ant MI 8/03 with stenting to LAD;  b. staged PCI w/ DES to OM1 8/03;  c. s/p DES to LAD 7/04;  d.  multiple caths in past (chronically abnl ECG); e. cardiac cath 3/12: LAD stent patent, distal LAD 40-50%, small ostial D1 80%, ostial D2 60%, OM-1 stent patent (20-30%), proximal RCA 50%, prox- mRCA 40-50%; f. cath 2012 nonobs g. cath 06/2013 s/p STEMI- microvascular dz- treated medically   . History of TIA (transient ischemic attack)   . 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 s/p ICD implantation in 2005. b. Gen change 2006. 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 c. s/p CRT-D implantation 07/2013.  [name] Long term (current) use of anticoagulants   . Paroxysmal atrial tachycardia   .  Paroxysmal atrial flutter   . Stroke   . Osteoporosis     Scheduled Meds:  Scheduled Meds: . amiodarone  200 mg Oral Daily  . apixaban  5 mg Oral BID  . carvedilol  3.125 mg Oral BID WC  . digoxin  0.0625 mg Oral Daily  . feeding supplement (RESOURCE BREEZE)  1 Container Oral BID BM  . folic acid-pyridoxine-cyancobalamin  1 tablet Oral Daily  . furosemide  40 mg Oral Daily  . levothyroxine  200 mcg Oral QAC breakfast  . nystatin  5 mL Oral QID  . omega-3 acid ethyl esters  1 g Oral Daily  . pantoprazole  40 mg Oral BID  . raloxifene  60 mg Oral Daily  . spironolactone  25 mg Oral Daily   Continuous Infusions: . dextrose 5 % and 0.45% NaCl 50 mL/hr at 10/02/13 1539   acetaminophen, clonazePAM, fluticasone, loratadine, nitroGLYCERIN, ondansetron (ZOFRAN) IV    PHYSICAL EXAM Filed Vitals:   10/02/13 1446 10/02/13 1754 10/02/13 2124 10/03/13 0515  BP:  102/55 105/52 107/57  Pulse:  74 78 73  Temp:   98.2 F (36.8 C) 98.4 F (36.9 C)  TempSrc:   Oral Oral  Resp:   15 15  Height:      Weight: 128 lb 8  oz (58.287 kg)     SpO2:   98% 99%    Well developed and nourished in no acute distress HENT normal Neck supple with JVP-flat Clear Regular rate and rhythm, no murmurs or gallops Abd-soft with active BS No Clubbing cyanosis edema Skin-warm and dry A & Oriented  Grossly normal sensory and motor function   TELEMETRY: Reviewed telemetry pt in Apcing:    Intake/Output Summary (Last 24 hours) at 10/03/13 0911 Last data filed at 10/03/13 0500  Gross per 24 hour  Intake    950 ml  Output      0 ml  Net    950 ml    LABS: Basic Metabolic Panel:  Recent Labs Lab 09/27/13 1216 09/29/13 1605 09/29/13 1626 09/30/13 0300  NA 138  --  141 138  K 5.7*  --  3.2* 4.1  CL 97  --  102 97  CO2 22  --   --  26  GLUCOSE 89  --  94 102*  BUN 8  --  4* 7  CREATININE 0.80  --  0.90 1.00  CALCIUM 8.5  --   --  8.1*  MG  --  1.8  --   --    Cardiac Enzymes: No  results found for this basename: CKTOTAL, CKMB, CKMBINDEX, TROPONINI,  in the last 72 hours CBC:  Recent Labs Lab 09/27/13 1216 09/29/13 1605 09/29/13 1626 09/30/13 0300  WBC 5.2 5.7  --  5.7  NEUTROABS 3.2 4.2  --   --   HGB 13.3 13.5 15.3* 12.0  HCT 38.9 38.7 45.0 35.2*  MCV 99.5 98.0  --  100.6*  PLT 153 149*  --  155   PROTIME: No results found for this basename: LABPROT, INR,  in the last 72 hours Liver Function Tests:  Recent Labs  10/01/13 1100  AST 52*  ALT 23  ALKPHOS 58  BILITOT 0.8  PROT 7.2  ALBUMIN 3.2*   No results found for this basename: LIPASE, AMYLASE,  in the last 72 hours BNP: BNP (last 3 results)  Recent Labs  04/22/13 1200 06/04/13 0957 06/18/13 1327  PROBNP 754.3* 2458.0* 1223.0*       ASSESSMENT AND PLAN:  Principal Problem:   Refractory paroxysmal atrial tachycardia Active Problems:   Ischemic cardiomyopathy   CAD (coronary artery disease)   AICD (automatic cardioverter/defibrillator) present   Pulmonary hypertension   Current use of long term anticoagulation   Atrial fibrillation   Epigastric abdominal pain   Nausea and vomiting  rhtyhm stable   Home today on nystatin F/u as scheduled    Signed, Virl Axe MD  10/03/2013

## 2013-10-03 NOTE — Discharge Summary (Signed)
Discharge Summary   Patient ID: [name] MRN: [id number], DOB/AGE: 05/14/1950 63 y.o. Admit date: 09/29/2013 D/C date:     10/03/2013  Primary Care Provider: [name], MD Primary Cardiologist: [name]; EP - [name]  Primary Discharge Diagnoses:  1. Refractory paroxysmal atrial tachycardia 2. Mild oral thrush and minimal antral gastritis 3. Wide complex tachycardia, question VT vs SVT-most likely abberancy  5. ICM s/p CRT-D (explant 2014 due to infection, then implant 07/2013)  5. H/o CAD - see below 6. Hypokalemia  7. Ischemic cardiomyopathy with chronic systolic heart failure  8. Hypothyroidism under treatment  Secondary Discharge Diagnoses:  . Spinal stenosis   . Numbness of foot   . Diverticulitis     a. s/p partial colectomy 1/12 with reversal in July 2012.  . Colitis, ischemic   . DJD (degenerative joint disease)   . LV (left ventricular) mural thrombus   . CKD (chronic kidney disease), stage III   . GERD (gastroesophageal reflux disease)   . Hypertension   . Hyperlipidemia   . Compression fracture     a. of L2  . 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 2/2 atrial fibrillation with RVR in the context of hypokalemia  . PAF (paroxysmal atrial fibrillation)     a. on Eliquis, digoxin, and amiodarone  . Anemia   . H/O hiatal hernia   . Sinus bradycardia   . Mitral regurgitation   . CAD (coronary artery disease)     a.  Ant MI 8/03 with stenting to LAD;  b. staged PCI w/ DES to OM1 8/03;  c. s/p DES to LAD 7/04;  d.  multiple caths in past (chronically abnl ECG); e. cardiac cath 3/12: LAD stent patent, distal LAD 40-50%, small ostial D1 80%, ostial D2 60%, OM-1 stent patent (20-30%), proximal RCA 50%, prox- mRCA 40-50%; f. cath 2012 nonobs g. cath 06/2013 & 08/2013 for "STEMI" - normal coronaries.  . History of TIA (transient ischemic attack)   . 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 s/p ICD  implantation in 2005. b. Gen change 2006. 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 c. s/p CRT-D implantation 07/2013.  [name] Long term (current) use of anticoagulants   . Paroxysmal atrial flutter   . Stroke   . Osteoporosis     Hospital Course: [name] is a 63 y.o. female with a history of CAD (stents to mLAD & staged PCI w/ DES to [place] 2003, inferior ST elevation on EKG 06/2013 & 08/2013 but cath with mild nonobstructive CAD), ICM EF 20-25% (s/p ICD placement with infection 11/2012 s/p extraction, then reimplantation 11/7024), chronic systolic CHF (EF 37-85%), HTN, GERD, CKD, HLD, TIA/CVA and paroxysmal atrial fibrillation/atrial tach/atrial flutter. She has seen EP multiple times over the last year to discuss antiarrhythmic therapy. She has not been a candidate for Tikosyn due to prolonged QT. CHF has made sotalol and dronederone a poor choice. Amiodarone has in the past has been associated with hepatotoxicity. She was admitted 03/2013 with atrial fib treated with short-acting dilt. She was readmitted with atrial tach 05/2013. AV node ablation and BiV insertion has been considered, however, due to multiple medical problems, the decision was made once again to use amiodarone instead. She was admitted 06/2013 with inferolateral ST elevation and underwent emergent cath with no culprit lesion found. There was possibility of microvascular disease. She underwent [employer] CRT-D implantation 07/2013 by Dr.  Caryl [name]. During that admission, amiodarone was increased due to recurrence of atrial tach. She has seen the CHF clinic in the interim. She has since gone to see Dr. Adrian [name] regarding atrial tach ablation. However, apparently while undergoing eval in his clinic, she was found to have rapid rates on 08/26/13 so was sent to the [facility] ER where code STEMI was called due to inferior ST elevation again. She was taken to the [facility] cath lab where cath again showed only mild  nonobstructive CAD. Dr. Ola [name] evidently told her to stop taking her losartan. She presented back to [facility] ED on 09/29/13 with epigastric pain/indigestion sensation moving upwards along with burping. She was also recently seen in the ED 2 days prior to admission with nausea, vomiting, diarrhea. CT abd/pelvis did not show any acute findings to explain her symptoms. In the ER, she appeared to have atrial fib and atrial tach, as well as a faster wide-complex rhythm on telemetry, question VT vs more likely SVT with abberrancy. As in prior EKGs, she develops ST elevation on EKG inferolaterally when in atrial fib without significant change from prior. K was low at 3.2, initial troponin was negative, labs otherwise nonacute.   She was admitted for observation and potassium was repleted. EP saw her the following morning for further guidance and by that time she had converted to NSR. Amiodarone was continued. Digoxin level was 0.5. Coreg was restarted for better rate control. She was seen by GI as most of her symptoms were GI-related (possibly perpetuating her atrial arrhythmias). C diff was negative. She underwent EGD which showed mild oral thrus and minimal gastritis. They recommended Nystatin swish and swallow QID for 7 days. She will follow up with Dr. Michail [name] in 3-4 weeks. From a rhythm standpoint the patient is stable today. Dr. Caryl [name] would like her to keep followup with Dr. Ola [name] as scheduled. Will continue to hold Losartan as held by Dr. Ola [name]. Dr. Caryl [name] has seen and examined the patient today and feels he is stable for discharge.  Discharge Vitals: Blood pressure 107/57, pulse 73, temperature 98.4 F (36.9 C), temperature source Oral, resp. rate 15, height 5\' 4"  (1.626 m), weight 128 lb 8 oz (58.287 kg), SpO2 99.00%.  Labs: Lab Results  Component Value Date   WBC 5.7 09/30/2013   HGB 12.0 09/30/2013   HCT 35.2* 09/30/2013   MCV 100.6* 09/30/2013   PLT 155 09/30/2013    Recent Labs Lab  09/30/13 0300 10/01/13 1100  NA 138  --   K 4.1  --   CL 97  --   CO2 26  --   BUN 7  --   CREATININE 1.00  --   CALCIUM 8.1*  --   PROT  --  7.2  BILITOT  --  0.8  ALKPHOS  --  58  ALT  --  23  AST  --  52*  GLUCOSE 102*  --     Lab Results  Component Value Date   CHOL 226* 04/22/2013   HDL 119 04/22/2013   LDLCALC 93 04/22/2013   TRIG 68 04/22/2013   Lab Results  Component Value Date   DDIMER 0.59* 02/17/2011    Diagnostic Studies/Procedures   Dg Chest 2 View  09/27/2013   CLINICAL DATA:  Shortness of breath and weakness.  EXAM: CHEST  2 VIEW  COMPARISON:  Two-view chest 08/06/2013  FINDINGS: Heart size is normal. Pacing and defibrillator wires are stable. The lungs are clear. No edema or effusion  is present. Emphysematous changes are again noted. Spinal augmentation in the upper thoracic spine is stable. Postsurgical changes are again noted in the lumbar spine. A remote fracture just above the lumbar hardware is stable.  IMPRESSION: 1. No acute cardiopulmonary disease. 2. Stable emphysematous changes. 3. Atherosclerosis. 4. Postsurgical changes of the spine.   Electronically Signed   By: Lawrence Santiago M.D.   On: 09/27/2013 12:57   Ct Abdomen Pelvis W Contrast  09/27/2013   CLINICAL DATA:  Vomiting and abdominal pain for 1 week. Occasional diarrhea.  EXAM: CT ABDOMEN AND PELVIS WITH CONTRAST  TECHNIQUE: Multidetector CT imaging of the abdomen and pelvis was performed using the standard protocol following bolus administration of intravenous contrast.  CONTRAST:  100 mL OMNIPAQUE IOHEXOL 300 MG/ML  SOLN  COMPARISON:  CT abdomen and pelvis 02/28/2012.  FINDINGS: There is some linear scar in the left lung base, unchanged. Dependent atelectasis is also noted. No pleural or pericardial effusion. There is mild cardiomegaly.  There is some high attenuation material the gallbladder consistent with sludge. No evidence of inflammatory change is identified. The liver, spleen, adrenal glands,  kidneys, pancreas and biliary tree are unremarkable. Uterus and adnexa are unremarkable. The patient is status post subtotal colectomy without evidence of complication. The stomach and small bowel appear normal. No lymphadenopathy or fluid collection is identified.  The patient is status post L3-S1 fusion. Remote L1 and L2 compression fractures are identified. There is also a compression fracture of T12 which is new since the comparison CT scan but appears remote. No worrisome bony lesion is identified.  IMPRESSION: No acute finding or finding to explain the patient's symptoms.  Status post subtotal colectomy.  Gallbladder sludge without evidence of inflammatory change.  Remote appearing T11 compression fracture is new since the comparison 2013 CT scan. L1 and L2 compression fractures are unchanged.   Electronically Signed   By: Inge Rise M.D.   On: 09/27/2013 15:43   Dg Chest Port 1 View  09/29/2013   CLINICAL DATA:  Chest pain and shortness of breath.  EXAM: PORTABLE CHEST - 1 VIEW  COMPARISON:  09/27/2013  FINDINGS: Right chest wall pacemaker appears stable. Mild cardiomegaly is stable. Coronary artery stents noted. Atherosclerotic calcification of the thoracic aortic arch. Normal pulmonary vascularity. Lungs are well expanded clear.  Vertebroplasty changes at 3 levels in the upper thoracic spine appears stable. Partially imaged postsurgical changes of the lumbar spine.  IMPRESSION: Stable mild cardiomegaly.  No acute findings.  The lungs are clear.  Vertebroplasty changes at 3 levels in the thoracic spine.   Electronically Signed   By: Curlene Dolphin M.D.   On: 09/29/2013 15:48    Discharge Medications   Current Discharge Medication List    START taking these medications   Details  carvedilol (COREG) 3.125 MG tablet Take 1 tablet (3.125 mg total) by mouth 2 (two) times daily with a meal. Qty: 60 tablet, Refills: 6    nystatin (MYCOSTATIN) 100000 UNIT/ML suspension Take 5 mLs (500,000 Units  total) by mouth 4 (four) times daily. For 7 days. Swish and swallow. Qty: 140 mL, Refills: 0      CONTINUE these medications which have NOT CHANGED   Details  amiodarone (PACERONE) 200 MG tablet Take 200 mg by mouth daily.    apixaban (ELIQUIS) 5 MG TABS tablet Take 1 tablet (5 mg total) by mouth 2 (two) times daily.     clonazePAM (KLONOPIN) 0.5 MG tablet Take 1 tablet (0.5 mg total) by  mouth 2 (two) times daily as needed for anxiety.    Associated Diagnoses: GAD (generalized anxiety disorder)    digoxin (LANOXIN) 0.125 MG tablet Take 0.5 tablets (0.0625 mg total) by mouth daily.     fexofenadine (ALLEGRA) 180 MG tablet Take 180 mg by mouth daily as needed for allergies.     fluticasone (FLONASE) 50 MCG/ACT nasal spray Place 2 sprays into both nostrils daily as needed for allergies.     folic acid-pyridoxine-cyancobalamin (FOLTX) 2.5-25-2 MG TABS Take 1 tablet by mouth daily.     furosemide (LASIX) 40 MG tablet Take 1 tablet (40 mg total) by mouth daily.     levothyroxine (SYNTHROID, LEVOTHROID) 200 MCG tablet Take 200 mcg by mouth daily before breakfast.     nitroGLYCERIN (NITROSTAT) 0.4 MG SL tablet Place 0.4 mg under the tongue every 5 (five) minutes as needed for chest pain.     omega-3 acid ethyl esters (LOVAZA) 1 G capsule Take 1 g by mouth daily.    ondansetron (ZOFRAN-ODT) 4 MG disintegrating tablet Take 4 mg by mouth 2 (two) times daily as needed for nausea or vomiting.    pantoprazole (PROTONIX) 40 MG tablet Take 1 tablet (40 mg total) by mouth 2 (two) times daily.    Associated Diagnoses: GERD (gastroesophageal reflux disease)    polyethylene glycol (MIRALAX / GLYCOLAX) packet Take 17 g by mouth daily as needed (for constipation).     raloxifene (EVISTA) 60 MG tablet Take 60 mg by mouth daily.    spironolactone (ALDACTONE) 25 MG tablet Take 25 mg by mouth daily.      STOP taking these medications     losartan (COZAAR) 25 MG tablet         Disposition     The patient will be discharged in stable condition to home. Discharge Instructions   Diet - low sodium heart healthy    Complete by:  As directed      Increase activity slowly    Complete by:  As directed           Follow-up Information   Follow up with Lear Ng., MD. (Please call office upon discharge to schedule appointment with Dr. Michail [name] in 3-4 weeks.)    Specialty:  Gastroenterology   Contact information:   [address], [place] [address] [phone]       Follow up with Becky Prows, MD. (Keep followup appointment with Dr. Ola [name] as scheduled)    Specialty:  Cardiology   Contact information:   [address] [place] [address] [phone]       Follow up with Virl Axe, MD. (11/05/13 at 3:30pm)    Specialty:  Cardiology   Contact information:   [address] [address] [phone]         Duration of Discharge Encounter: Greater than 30 minutes including physician and PA time.  Signed, Priya Matsen PA-C 10/03/2013, 9:45 AM

## 2013-10-07 ENCOUNTER — Telehealth: Payer: Self-pay | Admitting: Internal Medicine

## 2013-10-07 NOTE — Telephone Encounter (Signed)
Patient recently discharged from [facility] is needing ok to update patients med list.  Needs to add merilax as needed, zfran 4mg  2x day as needed, evista 60 mg daily, foltx 2.5-25.2 mg daily and mystatin 65ml 4x day for 7 more days.   Also, can nursing continue to see patient 2x week x 5 more weeks.

## 2013-10-08 NOTE — Telephone Encounter (Signed)
Returned call x 2//lmovm giving ok to continue visit.

## 2013-10-10 ENCOUNTER — Telehealth: Payer: Self-pay | Admitting: Internal Medicine

## 2013-10-10 NOTE — Telephone Encounter (Signed)
LMOVM giving verbal OK

## 2013-10-10 NOTE — Telephone Encounter (Signed)
Was in hospital from Sunday to Thursday.  A reassessment was performed.  Calling to continue orders : 2x week for 3 weeks  For home therapy

## 2013-10-11 ENCOUNTER — Telehealth: Payer: Self-pay | Admitting: *Deleted

## 2013-10-11 NOTE — Telephone Encounter (Signed)
Left msg on triage stating requesting verbal order to continue seeing pt 2 x's a week for next 3 weeks. Pt has been in the hospital for the past 2 weeks wasn't able to complete her therapy...Johny Chess   Kyung Bacca MD is out of the office will return call bck on Monday with his response...Johny Chess

## 2013-10-13 NOTE — Telephone Encounter (Signed)
yes

## 2013-10-14 NOTE — Telephone Encounter (Signed)
Called Connie no answer LMOm with md response...lmb

## 2013-10-25 ENCOUNTER — Telehealth: Payer: Self-pay | Admitting: *Deleted

## 2013-10-25 NOTE — Telephone Encounter (Signed)
Notified nurse with md response...lmb

## 2013-10-25 NOTE — Telephone Encounter (Signed)
Left msg on triage stating requesting verbal order for skill nursing to continue disease management & education. Also wanting to inform md pt will be having a cardiac ablation on next Wednesday 10/30/13...Johny Chess

## 2013-10-25 NOTE — Telephone Encounter (Signed)
yes

## 2013-11-05 ENCOUNTER — Ambulatory Visit (INDEPENDENT_AMBULATORY_CARE_PROVIDER_SITE_OTHER): Payer: Medicare Other | Admitting: Internal Medicine

## 2013-11-05 ENCOUNTER — Encounter: Payer: Self-pay | Admitting: Internal Medicine

## 2013-11-05 ENCOUNTER — Telehealth: Payer: Self-pay | Admitting: Internal Medicine

## 2013-11-05 VITALS — BP 120/86 | HR 70 | Ht 64.0 in | Wt 131.0 lb

## 2013-11-05 DIAGNOSIS — I2589 Other forms of chronic ischemic heart disease: Secondary | ICD-10-CM

## 2013-11-05 DIAGNOSIS — I255 Ischemic cardiomyopathy: Secondary | ICD-10-CM

## 2013-11-05 DIAGNOSIS — I5042 Chronic combined systolic (congestive) and diastolic (congestive) heart failure: Secondary | ICD-10-CM

## 2013-11-05 DIAGNOSIS — Z95810 Presence of automatic (implantable) cardiac defibrillator: Secondary | ICD-10-CM

## 2013-11-05 DIAGNOSIS — I2510 Atherosclerotic heart disease of native coronary artery without angina pectoris: Secondary | ICD-10-CM

## 2013-11-05 LAB — MDC_IDC_ENUM_SESS_TYPE_INCLINIC
Battery Remaining Longevity: 87.6 mo
Brady Statistic RV Percent Paced: 10 %
HIGH POWER IMPEDANCE MEASURED VALUE: 67.5 Ohm
Implantable Pulse Generator Serial Number: 7184288
Lead Channel Impedance Value: 362.5 Ohm
Lead Channel Impedance Value: 437.5 Ohm
Lead Channel Pacing Threshold Amplitude: 0.75 V
Lead Channel Pacing Threshold Amplitude: 2 V
Lead Channel Pacing Threshold Pulse Width: 0.5 ms
Lead Channel Pacing Threshold Pulse Width: 0.5 ms
Lead Channel Pacing Threshold Pulse Width: 0.8 ms
Lead Channel Setting Pacing Amplitude: 1.75 V
Lead Channel Setting Pacing Amplitude: 2 V
Lead Channel Setting Pacing Pulse Width: 0.5 ms
Lead Channel Setting Sensing Sensitivity: 0.5 mV
MDC IDC MSMT LEADCHNL LV PACING THRESHOLD AMPLITUDE: 2 V
MDC IDC MSMT LEADCHNL LV PACING THRESHOLD PULSEWIDTH: 0.8 ms
MDC IDC MSMT LEADCHNL RA SENSING INTR AMPL: 3.5 mV
MDC IDC MSMT LEADCHNL RV IMPEDANCE VALUE: 525 Ohm
MDC IDC MSMT LEADCHNL RV PACING THRESHOLD AMPLITUDE: 0.75 V
MDC IDC MSMT LEADCHNL RV SENSING INTR AMPL: 12 mV
MDC IDC SESS DTM: 20150818163556
MDC IDC SET LEADCHNL LV PACING AMPLITUDE: 3.5 V
MDC IDC SET LEADCHNL LV PACING PULSEWIDTH: 0.5 ms
MDC IDC STAT BRADY RA PERCENT PACED: 49 %
Zone Setting Detection Interval: 250 ms
Zone Setting Detection Interval: 300 ms

## 2013-11-05 NOTE — Progress Notes (Signed)
Patient Care Team: Janith Lima, MD as PCP - General Minus Breeding, MD (Cardiology)   HPI  [name] is a 63 y.o. female Seen in followup for an ICD implanted for primary prevention and for which she underwent device revision in 2006. She underwent repeat operation by Dr. Greggory Brandy at [facility] and presented with chronic smoldering infection in September 2014 She underwent device extraction September 2014 And was discharged on a LifeVest   Echocardiogram 2012 demonstrated significant left ventricular dysfunction with an EF of 25%.   She was hospitalized 3/15 with A/C CHF attributed in part to atrial tachy which was ultimately treated with amiodarone, after AV ablation/device implantation and primary ablation were considered, but pt and MDs were in favor of amio therapy   In  the past amio has been assoc with hepatotoxicity ; but with its discontinuation, the LFT abnormalities persisted   She was scheduled to undergo the procedure on [facility] with Dr. Ola Spurr. As canceled because of complications.  Past Medical History  Diagnosis Date  . Spinal stenosis   . Numbness of foot   . Diverticulitis     a. s/p partial colectomy 1/12 with reversal in July 2012.  . Colitis, ischemic   . DJD (degenerative joint disease)   . Ischemic cardiomyopathy     a. Echo 06/16/10: EF 25-30%, anteroseptal and apical hypokinesis, moderate AI, mild MR.   . LV (left ventricular) mural thrombus   . CKD (chronic kidney disease), stage III   . GERD (gastroesophageal reflux disease)   . Hypertension   . Hypothyroidism   . Hyperlipidemia   . Compression fracture     a. of L2  . 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 2/2 atrial fibrillation with RVR in the context of hypokalemia  . PAF (paroxysmal atrial fibrillation)     a. on Eliquis, digoxin, and amiodarone  . Anemia   . H/O hiatal hernia   . Sinus bradycardia   . Mitral regurgitation   . Chronic systolic  CHF (congestive heart failure)   . CAD (coronary artery disease)     a.  Ant MI 8/03 with stenting to LAD;  b. staged PCI w/ DES to OM1 8/03;  c. s/p DES to LAD 7/04;  d.  multiple caths in past (chronically abnl ECG); e. cardiac cath 3/12: LAD stent patent, distal LAD 40-50%, small ostial D1 80%, ostial D2 60%, OM-1 stent patent (20-30%), proximal RCA 50%, prox- mRCA 40-50%; f. cath 2012 nonobs g. cath 06/2013 and 08/2013 for "STEMI" - mild nonobst CAD only.  [name] History of TIA (transient ischemic attack)   . 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 s/p ICD implantation in 2005. b. Gen change 2006. 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 c. s/p CRT-D implantation 07/2013.  [name] Long term (current) use of anticoagulants   . Paroxysmal atrial tachycardia   . Paroxysmal atrial flutter   . Stroke   . Osteoporosis   . Oral thrush     a. By EGD 09/2013.  [name] Antral gastritis     a. By EGD 09/2013 - minimal.    Past Surgical History  Procedure Laterality Date  . Back surgery    . Cardiac defibrillator placement  07/2003; 10/2004; 2014  . Lumbar laminectomy/decompression microdiscectomy  10/2001    L5-S1/E-chart  . Knee arthroscopy Right   . Thyroidectomy  1970's  .  Total knee arthroplasty Left 11/2003  . X-stop implantation  12/2004    L3-4; L4-5  . Fixation kyphoplasty thoracic spine  07/2007    T3, 4, 6 compression fractures  . Shoulder open rotator cuff repair Right 04/2008  . Lumbar laminectomy/decompression microdiscectomy  01/2010  . Colectomy  03/2010    sigmoid left; transverse  . Peripherally inserted central catheter insertion  03/2010    removed upon discharge  . Colostomy reversal  12/2010  . Tonsillectomy and adenoidectomy  1969  . Appendectomy  03/2010  . Glaucoma surgery Right   . Cardiac defibrillator removal  12/17/2012    removed for infection  . Icd lead removal Left 12/17/2012    Procedure: ICD LEAD  REMOVAL;  Surgeon: Evans Lance, MD;  Location: [place];  Service: Cardiovascular;  Laterality: Left;  . Coronary angioplasty with stent placement  10/2001; 09/2002; ?date    "I've got a total of 4" (04/10/2013)  . Bi-ventricular implantable cardioverter defibrillator  (crt-d)  08/05/2013    STJ Quadra Assura CRTD implanted by Dr Caryl Comes (right sided)  . Esophagogastroduodenoscopy N/A 10/02/2013    Procedure: ESOPHAGOGASTRODUODENOSCOPY (EGD);  Surgeon: Lear Ng, MD;  Location: [facility] ENDOSCOPY;  Service: Endoscopy;  Laterality: N/A;    Current Outpatient Prescriptions  Medication Sig Dispense Refill  . amiodarone (PACERONE) 200 MG tablet Take 200 mg by mouth daily.      [name] apixaban (ELIQUIS) 5 MG TABS tablet Take 1 tablet (5 mg total) by mouth 2 (two) times daily.  60 tablet  3  . carvedilol (COREG) 3.125 MG tablet Take 1 tablet (3.125 mg total) by mouth 2 (two) times daily with a meal.  60 tablet  6  . clonazePAM (KLONOPIN) 0.5 MG tablet Take 1 tablet (0.5 mg total) by mouth 2 (two) times daily as needed for anxiety.  60 tablet  5  . digoxin (LANOXIN) 0.125 MG tablet Take 0.5 tablets (0.0625 mg total) by mouth daily.  45 tablet  3  . fexofenadine (ALLEGRA) 180 MG tablet Take 180 mg by mouth daily as needed for allergies.       . fluticasone (FLONASE) 50 MCG/ACT nasal spray Place 2 sprays into both nostrils daily as needed for allergies.       . folic acid-pyridoxine-cyancobalamin (FOLTX) 2.5-25-2 MG TABS Take 1 tablet by mouth daily.       . furosemide (LASIX) 40 MG tablet Take 1 tablet (40 mg total) by mouth daily.  30 tablet  6  . levothyroxine (SYNTHROID, LEVOTHROID) 200 MCG tablet Take 200 mcg by mouth daily before breakfast.       . nitroGLYCERIN (NITROSTAT) 0.4 MG SL tablet Place 0.4 mg under the tongue every 5 (five) minutes as needed for chest pain.       [name] nystatin (MYCOSTATIN) 100000 UNIT/ML suspension Take 5 mLs (500,000 Units total) by mouth 4 (four) times daily. For 7 days. Swish  and swallow.  140 mL  0  . omega-3 acid ethyl esters (LOVAZA) 1 G capsule Take 1 g by mouth daily.      . ondansetron (ZOFRAN-ODT) 4 MG disintegrating tablet Take 4 mg by mouth 2 (two) times daily as needed for nausea or vomiting.      . pantoprazole (PROTONIX) 40 MG tablet Take 1 tablet (40 mg total) by mouth 2 (two) times daily.  180 tablet  1  . polyethylene glycol (MIRALAX / GLYCOLAX) packet Take 17 g by mouth daily as needed (for constipation).       [name]  raloxifene (EVISTA) 60 MG tablet Take 60 mg by mouth daily.      [name] spironolactone (ALDACTONE) 25 MG tablet Take 25 mg by mouth daily.       No current facility-administered medications for this visit.    Allergies  Allergen Reactions  . Coconut Oil Anaphylaxis and Hives  . Lansoprazole Nausea Only  . Penicillins Swelling    Throat swells  . Statins Other (See Comments)    cramps  . Lisinopril     Cough- but still tolerates it  . Lactose Intolerance (Gi) Diarrhea and Other (See Comments)    Patient reports abdominal cramping and diarrhea with lactose products.     Review of Systems negative except from HPI and PMH  Physical Exam BP 120/86  Pulse 70  Ht 5\' 4"  (1.626 m)  Wt 131 lb (59.421 kg)  BMI 22.47 kg/m2 Well developed and well nourished in no acute distress HENT normal E scleral and icterus clear Neck Supple JVP flat; carotids brisk and full Clear to ausculation  Regular rate and rhythm, no murmurs gallops or rub Soft with active bowel sounds No clubbing cyanosis  Edema Alert and oriented, grossly normal motor and sensory function Skin Warm and Dry  ECG NSR 59 17/09/45  Assessment and  Plan  Atrial Tach  Nonischemic Cardiomyopathy  CHF-chronic systolic  Transaminase elevation  She is doing quite well currently on her amiodarone. She was to see Dr Judye Bos regarding ablation  This was canceled because of difficulties peri procedural. She is to undergo repeat procedure in September

## 2013-11-05 NOTE — Patient Instructions (Signed)
Your physician recommends that you continue on your current medications as directed. Please refer to the Current Medication list given to you today.  Your physician recommends that you schedule a follow-up appointment in: December with Dr. Caryl Comes

## 2013-11-05 NOTE — Telephone Encounter (Signed)
Tim with [facility] is calling to request a verbal for resumption of care for the patient. He can be reached back at [phone].

## 2013-11-07 ENCOUNTER — Ambulatory Visit: Payer: Medicare Other | Admitting: Internal Medicine

## 2013-11-07 NOTE — Telephone Encounter (Signed)
yes

## 2013-11-07 NOTE — Telephone Encounter (Signed)
Tim call to check up on this request, please call him back [phone] (right number).

## 2013-11-08 NOTE — Telephone Encounter (Signed)
Tim notified per MD

## 2013-11-13 ENCOUNTER — Encounter: Payer: Self-pay | Admitting: Internal Medicine

## 2013-11-14 ENCOUNTER — Telehealth: Payer: Self-pay | Admitting: Internal Medicine

## 2013-11-14 DIAGNOSIS — Z7901 Long term (current) use of anticoagulants: Secondary | ICD-10-CM

## 2013-11-14 DIAGNOSIS — Z7982 Long term (current) use of aspirin: Secondary | ICD-10-CM

## 2013-11-14 DIAGNOSIS — I509 Heart failure, unspecified: Secondary | ICD-10-CM

## 2013-11-14 DIAGNOSIS — I4891 Unspecified atrial fibrillation: Secondary | ICD-10-CM

## 2013-11-14 DIAGNOSIS — I2510 Atherosclerotic heart disease of native coronary artery without angina pectoris: Secondary | ICD-10-CM

## 2013-11-14 DIAGNOSIS — I10 Essential (primary) hypertension: Secondary | ICD-10-CM

## 2013-11-14 NOTE — Telephone Encounter (Signed)
Rec'd from [employer] forward 10 pages to [place]

## 2013-11-20 HISTORY — PX: ABLATION OF DYSRHYTHMIC FOCUS: SHX254

## 2013-11-26 ENCOUNTER — Telehealth: Payer: Self-pay | Admitting: *Deleted

## 2013-11-26 NOTE — Telephone Encounter (Signed)
yes

## 2013-11-26 NOTE — Telephone Encounter (Signed)
Left smg on triage stating pt has been d/c from [facility]. She called and wanting to resumed her home care & PT. Wanting to get verbal authorization form md..[name]

## 2013-11-26 NOTE — Telephone Encounter (Signed)
Notified cindy with md response...[name]

## 2013-11-27 ENCOUNTER — Telehealth: Payer: Self-pay | Admitting: Internal Medicine

## 2013-11-27 NOTE — Telephone Encounter (Signed)
[name] would like to get approval on continuing home health care.

## 2013-11-27 NOTE — Telephone Encounter (Signed)
yes

## 2013-11-29 NOTE — Telephone Encounter (Signed)
done

## 2013-12-06 ENCOUNTER — Encounter: Payer: Self-pay | Admitting: Internal Medicine

## 2013-12-06 ENCOUNTER — Other Ambulatory Visit (INDEPENDENT_AMBULATORY_CARE_PROVIDER_SITE_OTHER): Payer: Medicare Other

## 2013-12-06 ENCOUNTER — Ambulatory Visit (INDEPENDENT_AMBULATORY_CARE_PROVIDER_SITE_OTHER): Payer: Medicare Other | Admitting: Internal Medicine

## 2013-12-06 VITALS — BP 140/84 | HR 90 | Temp 98.2°F | Resp 16 | Ht 64.0 in | Wt 133.0 lb

## 2013-12-06 DIAGNOSIS — N182 Chronic kidney disease, stage 2 (mild): Secondary | ICD-10-CM

## 2013-12-06 DIAGNOSIS — I110 Hypertensive heart disease with heart failure: Secondary | ICD-10-CM

## 2013-12-06 DIAGNOSIS — I509 Heart failure, unspecified: Secondary | ICD-10-CM

## 2013-12-06 DIAGNOSIS — I480 Paroxysmal atrial fibrillation: Secondary | ICD-10-CM

## 2013-12-06 DIAGNOSIS — I4891 Unspecified atrial fibrillation: Secondary | ICD-10-CM

## 2013-12-06 DIAGNOSIS — E038 Other specified hypothyroidism: Secondary | ICD-10-CM

## 2013-12-06 DIAGNOSIS — E039 Hypothyroidism, unspecified: Secondary | ICD-10-CM

## 2013-12-06 DIAGNOSIS — I2510 Atherosclerotic heart disease of native coronary artery without angina pectoris: Secondary | ICD-10-CM

## 2013-12-06 LAB — BASIC METABOLIC PANEL
BUN: 16 mg/dL (ref 6–23)
CHLORIDE: 102 meq/L (ref 96–112)
CO2: 25 mEq/L (ref 19–32)
CREATININE: 1.4 mg/dL — AB (ref 0.4–1.2)
Calcium: 8.5 mg/dL (ref 8.4–10.5)
GFR: 47.5 mL/min — AB (ref >60.00)
Glucose, Bld: 104 mg/dL — ABNORMAL HIGH (ref 70–99)
Potassium: 3.9 mEq/L (ref 3.5–5.1)
Sodium: 138 mEq/L (ref 135–145)

## 2013-12-06 LAB — MAGNESIUM: MAGNESIUM: 1.8 mg/dL (ref 1.5–2.5)

## 2013-12-06 LAB — TSH: TSH: 2.08 u[IU]/mL (ref 0.35–4.50)

## 2013-12-06 NOTE — Progress Notes (Signed)
Subjective:    Patient ID: [name], female    DOB: [birth year], 63 y.o.   MRN: [id number]  HPI Comments: She is s/p an ablation at [facility] 2 weeks ago and is doing well, no palpitations and she feels much better.  Thyroid Problem Presents for follow-up visit. Symptoms include anxiety. Patient reports no cold intolerance, constipation, depressed mood, diaphoresis, diarrhea, dry skin, fatigue, hair loss, heat intolerance, hoarse voice, leg swelling, nail problem, palpitations, tremors, visual change, weight gain or weight loss. The symptoms have been improving. Past treatments include levothyroxine. The treatment provided significant relief. Her past medical history is significant for atrial fibrillation and heart failure.      Review of Systems  Constitutional: Negative.  Negative for fever, chills, weight loss, weight gain, diaphoresis, appetite change and fatigue.  HENT: Negative.  Negative for hoarse voice.   Eyes: Negative.   Respiratory: Negative.  Negative for apnea, cough, choking, chest tightness, shortness of breath, wheezing and stridor.   Cardiovascular: Negative.  Negative for chest pain, palpitations and leg swelling.  Gastrointestinal: Negative.  Negative for nausea, vomiting, abdominal pain, diarrhea and constipation.  Endocrine: Negative.  Negative for cold intolerance and heat intolerance.  Genitourinary: Negative.   Musculoskeletal: Negative.   Skin: Negative.  Negative for rash.  Allergic/Immunologic: Negative.   Neurological: Negative.  Negative for tremors.  Hematological: Negative.  Negative for adenopathy. Does not bruise/bleed easily.  Psychiatric/Behavioral: Positive for sleep disturbance. Negative for suicidal ideas, hallucinations, behavioral problems, confusion, self-injury, dysphoric mood, decreased concentration and agitation. The patient is nervous/anxious. The patient is not hyperactive.        Objective:   Physical Exam  Vitals  reviewed. Constitutional: She is oriented to person, place, and time. She appears well-developed and well-nourished. No distress.  HENT:  Head: Normocephalic and atraumatic.  Mouth/Throat: Oropharynx is clear and moist. No oropharyngeal exudate.  Eyes: Conjunctivae are normal. Right eye exhibits no discharge. Left eye exhibits no discharge. No scleral icterus.  Neck: Normal range of motion. Neck supple. No JVD present. No tracheal deviation present. No thyromegaly present.  Cardiovascular: Normal rate, regular rhythm, normal heart sounds and intact distal pulses.  Exam reveals no gallop and no friction rub.   No murmur heard. Pulmonary/Chest: Effort normal and breath sounds normal. No stridor. No respiratory distress. She has no wheezes. She has no rales. She exhibits no tenderness.  Abdominal: Soft. Bowel sounds are normal. She exhibits no distension and no mass. There is no tenderness. There is no rebound and no guarding.  Musculoskeletal: Normal range of motion. She exhibits no edema and no tenderness.  Lymphadenopathy:    She has no cervical adenopathy.  Neurological: She is oriented to person, place, and time.  Skin: Skin is warm and dry. No rash noted. She is not diaphoretic. No erythema. No pallor.     Lab Results  Component Value Date   WBC 5.7 09/30/2013   HGB 12.0 09/30/2013   HCT 35.2* 09/30/2013   PLT 155 09/30/2013   GLUCOSE 102* 09/30/2013   CHOL 226* 04/22/2013   TRIG 68 04/22/2013   HDL 119 04/22/2013   LDLDIRECT 126.3 12/15/2011   LDLCALC 93 04/22/2013   ALT 23 10/01/2013   AST 52* 10/01/2013   NA 138 09/30/2013   K 4.1 09/30/2013   CL 97 09/30/2013   CREATININE 1.00 09/30/2013   BUN 7 09/30/2013   CO2 26 09/30/2013   TSH 1.930 06/18/2013   INR 1.09 09/29/2013   HGBA1C 5.7* 06/06/2012  Assessment & Plan:

## 2013-12-06 NOTE — Assessment & Plan Note (Signed)
She has good rate and rhythm control 

## 2013-12-06 NOTE — Assessment & Plan Note (Signed)
I will recheck her TSH today and will adjust her dose if needed 

## 2013-12-06 NOTE — Patient Instructions (Signed)
Hypothyroidism The thyroid is a large gland located in the lower front of your neck. The thyroid gland helps control metabolism. Metabolism is how your body handles food. It controls metabolism with the hormone thyroxine. When this gland is underactive (hypothyroid), it produces too little hormone.  CAUSES These include:   Absence or destruction of thyroid tissue.  Goiter due to iodine deficiency.  Goiter due to medications.  Congenital defects (since birth).  Problems with the pituitary. This causes a lack of TSH (thyroid stimulating hormone). This hormone tells the thyroid to turn out more hormone. SYMPTOMS  Lethargy (feeling as though you have no energy)  Cold intolerance  Weight gain (in spite of normal food intake)  Dry skin  Coarse hair  Menstrual irregularity (if severe, may lead to infertility)  Slowing of thought processes Cardiac problems are also caused by insufficient amounts of thyroid hormone. Hypothyroidism in the newborn is cretinism, and is an extreme form. It is important that this form be treated adequately and immediately or it will lead rapidly to retarded physical and mental development. DIAGNOSIS  To prove hypothyroidism, your caregiver may do blood tests and ultrasound tests. Sometimes the signs are hidden. It may be necessary for your caregiver to watch this illness with blood tests either before or after diagnosis and treatment. TREATMENT  Low levels of thyroid hormone are increased by using synthetic thyroid hormone. This is a safe, effective treatment. It usually takes about four weeks to gain the full effects of the medication. After you have the full effect of the medication, it will generally take another four weeks for problems to leave. Your caregiver may start you on low doses. If you have had heart problems the dose may be gradually increased. It is generally not an emergency to get rapidly to normal. HOME CARE INSTRUCTIONS   Take your  medications as your caregiver suggests. Let your caregiver know of any medications you are taking or start taking. Your caregiver will help you with dosage schedules.  As your condition improves, your dosage needs may increase. It will be necessary to have continuing blood tests as suggested by your caregiver.  Report all suspected medication side effects to your caregiver. SEEK MEDICAL CARE IF: Seek medical care if you develop:  Sweating.  Tremulousness (tremors).  Anxiety.  Rapid weight loss.  Heat intolerance.  Emotional swings.  Diarrhea.  Weakness. SEEK IMMEDIATE MEDICAL CARE IF:  You develop chest pain, an irregular heart beat (palpitations), or a rapid heart beat. MAKE SURE YOU:   Understand these instructions.  Will watch your condition.  Will get help right away if you are not doing well or get worse. Document Released: 03/07/2005 Document Revised: 05/30/2011 Document Reviewed: 10/26/2007 [employer] Patient Information 2015 [employer]. This information is not intended to replace advice given to you by your health care provider. Make sure you discuss any questions you have with your health care provider.  

## 2013-12-06 NOTE — Assessment & Plan Note (Signed)
Her BP is well controlled I will monitor her lytes and renal function today 

## 2013-12-06 NOTE — Progress Notes (Signed)
Pre visit review using our clinic review tool, if applicable. No additional management support is needed unless otherwise documented below in the visit note. 

## 2013-12-07 LAB — DIGOXIN LEVEL: Digoxin Level: 0.4 ng/mL — ABNORMAL LOW (ref 0.8–2.0)

## 2013-12-09 ENCOUNTER — Encounter: Payer: Self-pay | Admitting: Internal Medicine

## 2013-12-12 ENCOUNTER — Telehealth: Payer: Self-pay | Admitting: Internal Medicine

## 2013-12-12 NOTE — Telephone Encounter (Signed)
Calling in to inquire on orders faxed over 9/22 needing provider signature for occupational therapy.

## 2013-12-12 NOTE — Telephone Encounter (Signed)
Have not seen any fax, if received would have been scanned. Ok to refax if needed.

## 2013-12-12 NOTE — Telephone Encounter (Signed)
Nothing is scanned into media.   Company will refax.

## 2013-12-19 ENCOUNTER — Telehealth: Payer: Self-pay | Admitting: *Deleted

## 2013-12-19 NOTE — Telephone Encounter (Signed)
Left msg on triage stating she had labs done 2 weeks ago never received results. Called pt bck inform her md release the results to the mychart system gave her md response...Johny Chess

## 2014-01-03 ENCOUNTER — Telehealth: Payer: Self-pay | Admitting: Internal Medicine

## 2014-01-03 NOTE — Telephone Encounter (Signed)
[name] from [facility] needs verbal order for OT, twice a week for 5 weeks. [phone]

## 2014-01-03 NOTE — Telephone Encounter (Signed)
Notified connie with md response.../lmb 

## 2014-01-03 NOTE — Telephone Encounter (Signed)
ok 

## 2014-01-16 ENCOUNTER — Emergency Department (HOSPITAL_COMMUNITY): Payer: Medicare Other

## 2014-01-16 ENCOUNTER — Inpatient Hospital Stay (HOSPITAL_COMMUNITY)
Admission: EM | Admit: 2014-01-16 | Discharge: 2014-01-19 | DRG: 309 | Disposition: A | Discharge status: Home / Self Care | Payer: Medicare Other | Attending: Cardiology | Admitting: Cardiology

## 2014-01-16 ENCOUNTER — Encounter (HOSPITAL_COMMUNITY): Payer: Self-pay | Admitting: Emergency Medicine

## 2014-01-16 DIAGNOSIS — I472 Ventricular tachycardia, unspecified: Secondary | ICD-10-CM

## 2014-01-16 DIAGNOSIS — I340 Nonrheumatic mitral (valve) insufficiency: Secondary | ICD-10-CM | POA: Diagnosis present

## 2014-01-16 DIAGNOSIS — N183 Chronic kidney disease, stage 3 (moderate): Secondary | ICD-10-CM | POA: Diagnosis present

## 2014-01-16 DIAGNOSIS — I129 Hypertensive chronic kidney disease with stage 1 through stage 4 chronic kidney disease, or unspecified chronic kidney disease: Secondary | ICD-10-CM | POA: Diagnosis present

## 2014-01-16 DIAGNOSIS — I5042 Chronic combined systolic (congestive) and diastolic (congestive) heart failure: Secondary | ICD-10-CM | POA: Diagnosis present

## 2014-01-16 DIAGNOSIS — N289 Disorder of kidney and ureter, unspecified: Secondary | ICD-10-CM | POA: Diagnosis present

## 2014-01-16 DIAGNOSIS — I480 Paroxysmal atrial fibrillation: Secondary | ICD-10-CM | POA: Diagnosis present

## 2014-01-16 DIAGNOSIS — Z888 Allergy status to other drugs, medicaments and biological substances status: Secondary | ICD-10-CM

## 2014-01-16 DIAGNOSIS — Z8673 Personal history of transient ischemic attack (TIA), and cerebral infarction without residual deficits: Secondary | ICD-10-CM | POA: Diagnosis not present

## 2014-01-16 DIAGNOSIS — I4892 Unspecified atrial flutter: Secondary | ICD-10-CM | POA: Diagnosis present

## 2014-01-16 DIAGNOSIS — I255 Ischemic cardiomyopathy: Secondary | ICD-10-CM | POA: Diagnosis present

## 2014-01-16 DIAGNOSIS — Z880 Allergy status to penicillin: Secondary | ICD-10-CM | POA: Diagnosis not present

## 2014-01-16 DIAGNOSIS — E785 Hyperlipidemia, unspecified: Secondary | ICD-10-CM | POA: Diagnosis present

## 2014-01-16 DIAGNOSIS — Z87891 Personal history of nicotine dependence: Secondary | ICD-10-CM

## 2014-01-16 DIAGNOSIS — M1990 Unspecified osteoarthritis, unspecified site: Secondary | ICD-10-CM | POA: Diagnosis present

## 2014-01-16 DIAGNOSIS — Z7901 Long term (current) use of anticoagulants: Secondary | ICD-10-CM | POA: Diagnosis not present

## 2014-01-16 DIAGNOSIS — E039 Hypothyroidism, unspecified: Secondary | ICD-10-CM | POA: Diagnosis present

## 2014-01-16 DIAGNOSIS — N189 Chronic kidney disease, unspecified: Secondary | ICD-10-CM | POA: Diagnosis present

## 2014-01-16 DIAGNOSIS — K219 Gastro-esophageal reflux disease without esophagitis: Secondary | ICD-10-CM | POA: Diagnosis present

## 2014-01-16 DIAGNOSIS — Z95810 Presence of automatic (implantable) cardiac defibrillator: Secondary | ICD-10-CM | POA: Diagnosis not present

## 2014-01-16 DIAGNOSIS — E890 Postprocedural hypothyroidism: Secondary | ICD-10-CM | POA: Diagnosis present

## 2014-01-16 DIAGNOSIS — Z9049 Acquired absence of other specified parts of digestive tract: Secondary | ICD-10-CM | POA: Diagnosis present

## 2014-01-16 DIAGNOSIS — Z96652 Presence of left artificial knee joint: Secondary | ICD-10-CM | POA: Diagnosis present

## 2014-01-16 DIAGNOSIS — I2510 Atherosclerotic heart disease of native coronary artery without angina pectoris: Secondary | ICD-10-CM | POA: Diagnosis present

## 2014-01-16 DIAGNOSIS — I509 Heart failure, unspecified: Secondary | ICD-10-CM

## 2014-01-16 DIAGNOSIS — M810 Age-related osteoporosis without current pathological fracture: Secondary | ICD-10-CM | POA: Diagnosis present

## 2014-01-16 DIAGNOSIS — Z955 Presence of coronary angioplasty implant and graft: Secondary | ICD-10-CM | POA: Diagnosis not present

## 2014-01-16 HISTORY — DX: Ventricular tachycardia, unspecified: I47.20

## 2014-01-16 HISTORY — DX: Ventricular tachycardia: I47.2

## 2014-01-16 LAB — CBC WITH DIFFERENTIAL/PLATELET
BASOS ABS: 0 10*3/uL (ref 0.0–0.1)
BASOS PCT: 0 % (ref 0–1)
EOS ABS: 0 10*3/uL (ref 0.0–0.7)
Eosinophils Relative: 0 % (ref 0–5)
HCT: 34.5 % — ABNORMAL LOW (ref 36.0–46.0)
Hemoglobin: 11.6 g/dL — ABNORMAL LOW (ref 12.0–15.0)
Lymphocytes Relative: 23 % (ref 12–46)
Lymphs Abs: 1 10*3/uL (ref 0.7–4.0)
MCH: 34.1 pg — ABNORMAL HIGH (ref 26.0–34.0)
MCHC: 33.6 g/dL (ref 30.0–36.0)
MCV: 101.5 fL — ABNORMAL HIGH (ref 78.0–100.0)
Monocytes Absolute: 0.7 10*3/uL (ref 0.1–1.0)
Monocytes Relative: 17 % — ABNORMAL HIGH (ref 3–12)
NEUTROS PCT: 60 % (ref 43–77)
Neutro Abs: 2.5 10*3/uL (ref 1.7–7.7)
PLATELETS: 134 10*3/uL — AB (ref 150–400)
RBC: 3.4 MIL/uL — ABNORMAL LOW (ref 3.87–5.11)
RDW: 18.9 % — ABNORMAL HIGH (ref 11.5–15.5)
WBC: 4.2 10*3/uL (ref 4.0–10.5)

## 2014-01-16 LAB — BASIC METABOLIC PANEL
ANION GAP: 20 — AB (ref 5–15)
BUN: 29 mg/dL — ABNORMAL HIGH (ref 6–23)
CALCIUM: 8.1 mg/dL — AB (ref 8.4–10.5)
CO2: 22 mEq/L (ref 19–32)
Chloride: 98 mEq/L (ref 96–112)
Creatinine, Ser: 1.28 mg/dL — ABNORMAL HIGH (ref 0.50–1.10)
GFR calc non Af Amer: 43 mL/min — ABNORMAL LOW (ref >90)
GFR, EST AFRICAN AMERICAN: 50 mL/min — AB (ref >90)
Glucose, Bld: 167 mg/dL — ABNORMAL HIGH (ref 70–99)
Potassium: 4 mEq/L (ref 3.7–5.3)
SODIUM: 140 meq/L (ref 137–147)

## 2014-01-16 LAB — MRSA PCR SCREENING: MRSA by PCR: NEGATIVE

## 2014-01-16 LAB — TROPONIN I: Troponin I: 0.3 ng/mL (ref <0.30)

## 2014-01-16 LAB — MAGNESIUM: Magnesium: 1.7 mg/dL (ref 1.5–2.5)

## 2014-01-16 LAB — TSH: TSH: 1.83 u[IU]/mL (ref 0.350–4.500)

## 2014-01-16 LAB — DIGOXIN LEVEL: Digoxin Level: <0.3 ng/mL — ABNORMAL LOW (ref 0.8–2.0)

## 2014-01-16 MED ORDER — FLUTICASONE PROPIONATE 50 MCG/ACT NA SUSP
2.0000 | Freq: Every day | NASAL | Status: DC | PRN
  Filled 2014-01-16: qty 16

## 2014-01-16 MED ORDER — FENTANYL CITRATE 0.05 MG/ML IJ SOLN
50.0000 ug | Freq: Once | INTRAMUSCULAR | Status: AC
  Administered 2014-01-16: 50 ug via INTRAVENOUS

## 2014-01-16 MED ORDER — FENTANYL CITRATE 0.05 MG/ML IJ SOLN
INTRAMUSCULAR | Status: AC
  Filled 2014-01-16: qty 4

## 2014-01-16 MED ORDER — ASPIRIN 300 MG RE SUPP
300.0000 mg | Freq: Once | RECTAL | Status: DC
  Filled 2014-01-16: qty 1

## 2014-01-16 MED ORDER — SODIUM CHLORIDE 0.9 % IJ SOLN: 3.0000 mL | INTRAMUSCULAR | Status: DC | PRN

## 2014-01-16 MED ORDER — SODIUM CHLORIDE 0.9 % IV SOLN
INTRAVENOUS | Status: DC | PRN
  Administered 2014-01-16: 1000 mL via INTRAVENOUS

## 2014-01-16 MED ORDER — ONDANSETRON HCL 4 MG/2ML IJ SOLN
4.0000 mg | Freq: Four times a day (QID) | INTRAMUSCULAR | Status: DC | PRN
  Administered 2014-01-16: 4 mg via INTRAVENOUS
  Filled 2014-01-16: qty 2

## 2014-01-16 MED ORDER — FUROSEMIDE 40 MG PO TABS
40.0000 mg | ORAL_TABLET | Freq: Every day | ORAL | Status: DC
  Administered 2014-01-17 – 2014-01-19 (×3): 40 mg via ORAL
  Filled 2014-01-16 (×3): qty 1

## 2014-01-16 MED ORDER — NITROGLYCERIN 0.4 MG SL SUBL: 0.4000 mg | SUBLINGUAL_TABLET | SUBLINGUAL | Status: DC | PRN

## 2014-01-16 MED ORDER — OMEGA-3-ACID ETHYL ESTERS 1 G PO CAPS
1.0000 g | ORAL_CAPSULE | Freq: Every day | ORAL | Status: DC
  Administered 2014-01-17 – 2014-01-19 (×3): 1 g via ORAL
  Filled 2014-01-16 (×3): qty 1

## 2014-01-16 MED ORDER — CLONAZEPAM 0.5 MG PO TABS: 0.5000 mg | ORAL_TABLET | Freq: Two times a day (BID) | ORAL | Status: DC | PRN

## 2014-01-16 MED ORDER — LORATADINE 10 MG PO TABS
10.0000 mg | ORAL_TABLET | Freq: Every day | ORAL | Status: DC
  Administered 2014-01-17 – 2014-01-19 (×3): 10 mg via ORAL
  Filled 2014-01-16 (×3): qty 1

## 2014-01-16 MED ORDER — AMIODARONE HCL IN DEXTROSE 360-4.14 MG/200ML-% IV SOLN
30.0000 mg/h | INTRAVENOUS | Status: DC
  Administered 2014-01-17 – 2014-01-18 (×3): 30 mg/h via INTRAVENOUS
  Filled 2014-01-16 (×3): qty 200

## 2014-01-16 MED ORDER — PANTOPRAZOLE SODIUM 40 MG PO TBEC
40.0000 mg | DELAYED_RELEASE_TABLET | Freq: Two times a day (BID) | ORAL | Status: DC
  Administered 2014-01-16 – 2014-01-19 (×6): 40 mg via ORAL
  Filled 2014-01-16 (×6): qty 1

## 2014-01-16 MED ORDER — SODIUM CHLORIDE 0.9 % IV SOLN: 250.0000 mL | INTRAVENOUS | Status: DC | PRN

## 2014-01-16 MED ORDER — ALPRAZOLAM 0.25 MG PO TABS: 0.2500 mg | ORAL_TABLET | Freq: Two times a day (BID) | ORAL | Status: DC | PRN

## 2014-01-16 MED ORDER — ZOLPIDEM TARTRATE 5 MG PO TABS
5.0000 mg | ORAL_TABLET | Freq: Every evening | ORAL | Status: DC | PRN
  Administered 2014-01-17 – 2014-01-18 (×2): 5 mg via ORAL
  Filled 2014-01-16 (×2): qty 1

## 2014-01-16 MED ORDER — CARVEDILOL 3.125 MG PO TABS
3.1250 mg | ORAL_TABLET | Freq: Two times a day (BID) | ORAL | Status: DC
  Administered 2014-01-16 – 2014-01-19 (×7): 3.125 mg via ORAL
  Filled 2014-01-16 (×7): qty 1

## 2014-01-16 MED ORDER — MIDAZOLAM HCL 2 MG/2ML IJ SOLN
INTRAMUSCULAR | Status: AC
Start: 2014-01-16 — End: 2014-01-17
  Filled 2014-01-16: qty 4

## 2014-01-16 MED ORDER — SPIRONOLACTONE 25 MG PO TABS
25.0000 mg | ORAL_TABLET | Freq: Every day | ORAL | Status: DC
  Administered 2014-01-17 – 2014-01-19 (×3): 25 mg via ORAL
  Filled 2014-01-16 (×3): qty 1

## 2014-01-16 MED ORDER — APIXABAN 5 MG PO TABS
5.0000 mg | ORAL_TABLET | Freq: Two times a day (BID) | ORAL | Status: DC
  Administered 2014-01-16 – 2014-01-19 (×6): 5 mg via ORAL
  Filled 2014-01-16 (×6): qty 1

## 2014-01-16 MED ORDER — RALOXIFENE HCL 60 MG PO TABS
60.0000 mg | ORAL_TABLET | Freq: Every day | ORAL | Status: DC
  Administered 2014-01-17 – 2014-01-19 (×3): 60 mg via ORAL
  Filled 2014-01-16 (×3): qty 1

## 2014-01-16 MED ORDER — FUROSEMIDE 40 MG PO TABS: 40.0000 mg | ORAL_TABLET | Freq: Every day | ORAL | Status: DC

## 2014-01-16 MED ORDER — AMIODARONE HCL IN DEXTROSE 360-4.14 MG/200ML-% IV SOLN
60.0000 mg/h | INTRAVENOUS | Status: AC
  Administered 2014-01-16: 60 mg/h via INTRAVENOUS
  Filled 2014-01-16: qty 200

## 2014-01-16 MED ORDER — POLYETHYLENE GLYCOL 3350 17 G PO PACK: 17.0000 g | PACK | Freq: Every day | ORAL | Status: DC | PRN

## 2014-01-16 MED ORDER — MIDAZOLAM HCL 2 MG/2ML IJ SOLN
2.0000 mg | Freq: Once | INTRAMUSCULAR | Status: AC
  Administered 2014-01-16: 2 mg via INTRAVENOUS

## 2014-01-16 MED ORDER — ASPIRIN 81 MG PO CHEW
324.0000 mg | CHEWABLE_TABLET | Freq: Once | ORAL | Status: AC
  Administered 2014-01-16: 324 mg via ORAL
  Filled 2014-01-16: qty 4

## 2014-01-16 MED ORDER — AMIODARONE HCL IN DEXTROSE 360-4.14 MG/200ML-% IV SOLN
60.0000 mg/h | Freq: Once | INTRAVENOUS | Status: DC
  Filled 2014-01-16: qty 200

## 2014-01-16 MED ORDER — ACETAMINOPHEN 325 MG PO TABS
650.0000 mg | ORAL_TABLET | ORAL | Status: DC | PRN
  Administered 2014-01-17 – 2014-01-18 (×4): 650 mg via ORAL
  Filled 2014-01-16 (×3): qty 2

## 2014-01-16 MED ORDER — LEVOTHYROXINE SODIUM 200 MCG PO TABS
200.0000 ug | ORAL_TABLET | Freq: Every day | ORAL | Status: DC
  Administered 2014-01-17 – 2014-01-19 (×3): 200 ug via ORAL
  Filled 2014-01-16: qty 1
  Filled 2014-01-16 (×2): qty 2
  Filled 2014-01-16 (×3): qty 1
  Filled 2014-01-16: qty 2

## 2014-01-16 MED ORDER — SODIUM CHLORIDE 0.9 % IJ SOLN
3.0000 mL | Freq: Two times a day (BID) | INTRAMUSCULAR | Status: DC
  Administered 2014-01-16 – 2014-01-19 (×5): 3 mL via INTRAVENOUS

## 2014-01-16 NOTE — ED Notes (Signed)
Requested Cardiology page to discuss nausea/pain medicine with Cardiologist

## 2014-01-16 NOTE — ED Notes (Signed)
Cardioverted at 100J from VT rate 200 to NSR rate 80. 105/60

## 2014-01-16 NOTE — ED Notes (Addendum)
Per EMS: Pt reports central chest discomfort with SOB, nausea and emesis. Pt reports "I had to prop myself up in bed to sleep". On arrival, EMS noted pt to be in V tach with pulses, rate 180's. Pt AO x 4. Pt given 150 mg of Amiodarone in 32mL prior to arrival with no change in rate or rhythm. On arrival pt reports 8/10 chest pressure. Reports ablation appx 5 weeks ago.

## 2014-01-16 NOTE — Consult Note (Signed)
ELECTROPHYSIOLOGY CONSULT NOTE    Patient ID: AUNISTY REALI MRN: [id number], DOB/AGE: 29-Mar-1950 63 y.o.  Admit date: 01/16/2014 Date of Consult:01/16/2014  Primary Physician: [name], MD Primary Cardiologist: Tamala Julian Heart Failure: McLean/Bensimhon Electrophysiologist: Caryl Comes  Reason for Consultation: VT  HPI:  [name] is a 63 y.o. female with a past medical history of ICM (s/p ICD implantation in 2005; gen change 2006; dual chamber upgrade 2014 with subsequent extraction due to infection 11/2012, CRT-D implant 10/4663), chronic systolic heart failure, CAD last intervention 2004, last cath 06/2013 (patent the LAD and circumflex stents), myoview 04/2013 with no ischemia), prior CVA, chronic kidney disease and atrial arrhythmias.  She originally underwent ICD implantation in 2005, gen change in 2006, upgrade to dual chamber ICD in 2014 with subsequent infection and extraction.  Now s/p CRTD implant May 2015.  She has a history of previous inappropriate shocks for atrial arrhythmias. She underwent afib ablation by Dr Ola Spurr recently.  She has done well s/p procedure and has been asymptomatic with ERAF.  She presents with sustained symptomatic VT.  Her VT was at 176 bpm and therefore was below her ICD detection rate.  She was hemodynamically unstable with her VT and therefore underwent emergent cardioversion in the ER.  She has been placed on IV amiodarone.  Presently, she is resting comfortably and is without complaint.  She denies CP, SOB, dizziness, or other symptoms.    Past Medical History  Diagnosis Date  . Spinal stenosis   . Numbness of foot   . Diverticulitis     a. s/p partial colectomy 1/12 with reversal in July 2012.  . Colitis, ischemic   . DJD (degenerative joint disease)   . Ischemic cardiomyopathy     a. Echo 06/16/10: EF 25-30%, anteroseptal and apical hypokinesis, moderate AI, mild MR.   . LV (left ventricular) mural thrombus   . CKD (chronic kidney  disease), stage III   . GERD (gastroesophageal reflux disease)   . Hypertension   . Hypothyroidism   . Hyperlipidemia   . Compression fracture     a. of L2  . 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 2/2 atrial fibrillation with RVR in the context of hypokalemia  . PAF (paroxysmal atrial fibrillation)     a. on Eliquis, digoxin, and amiodarone  . Anemia   . H/O hiatal hernia   . Sinus bradycardia   . Mitral regurgitation   . Chronic systolic CHF (congestive heart failure)   . CAD (coronary artery disease)     a.  Ant MI 8/03 with stenting to LAD;  b. staged PCI w/ DES to OM1 8/03;  c. s/p DES to LAD 7/04;  d.  multiple caths in past (chronically abnl ECG); e. cardiac cath 3/12: LAD stent patent, distal LAD 40-50%, small ostial D1 80%, ostial D2 60%, OM-1 stent patent (20-30%), proximal RCA 50%, prox- mRCA 40-50%; f. cath 2012 nonobs g. cath 06/2013 and 08/2013 for "STEMI" - mild nonobst CAD only.  [name] History of TIA (transient ischemic attack)   . 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 s/p ICD implantation in 2005. b. Gen change 2006. 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 c. s/p CRT-D implantation 07/2013.  [name] Long term (current) use of anticoagulants   . Paroxysmal atrial tachycardia   . Paroxysmal atrial flutter   . Stroke   . Osteoporosis   . Oral thrush  a. By EGD 09/2013.  [name] Antral gastritis     a. By EGD 09/2013 - minimal.     Surgical History:  Past Surgical History  Procedure Laterality Date  . Back surgery    . Cardiac defibrillator placement  07/2003; 10/2004; 2014  . Lumbar laminectomy/decompression microdiscectomy  10/2001    L5-S1/E-chart  . Knee arthroscopy Right   . Thyroidectomy  1970's  . Total knee arthroplasty Left 11/2003  . X-stop implantation  12/2004    L3-4; L4-5  . Fixation kyphoplasty thoracic spine  07/2007    T3, 4, 6 compression  fractures  . Shoulder open rotator cuff repair Right 04/2008  . Lumbar laminectomy/decompression microdiscectomy  01/2010  . Colectomy  03/2010    sigmoid left; transverse  . Peripherally inserted central catheter insertion  03/2010    removed upon discharge  . Colostomy reversal  12/2010  . Tonsillectomy and adenoidectomy  1969  . Appendectomy  03/2010  . Glaucoma surgery Right   . Cardiac defibrillator removal  12/17/2012    removed for infection  . Icd lead removal Left 12/17/2012    Procedure: ICD LEAD REMOVAL;  Surgeon: Evans Lance, MD;  Location: [place];  Service: Cardiovascular;  Laterality: Left;  . Coronary angioplasty with stent placement  10/2001; 09/2002; ?date    "I've got a total of 4" (04/10/2013)  . Bi-ventricular implantable cardioverter defibrillator  (crt-d)  08/05/2013    STJ Quadra Assura CRTD implanted by Dr Caryl Comes (right sided)  . Esophagogastroduodenoscopy N/A 10/02/2013    Procedure: ESOPHAGOGASTRODUODENOSCOPY (EGD);  Surgeon: Lear Ng, MD;  Location: [facility] ENDOSCOPY;  Service: Endoscopy;  Laterality: N/A;      (Not in a hospital admission)  Inpatient Medications:  . fentaNYL      . midazolam        Allergies:  Allergies  Allergen Reactions  . Coconut Oil Anaphylaxis and Hives  . Lansoprazole Nausea Only  . Penicillins Swelling    Throat swells  . Statins Other (See Comments)    cramps  . Lisinopril     Cough- but still tolerates it  . Lactose Intolerance (Gi) Diarrhea and Other (See Comments)    Patient reports abdominal cramping and diarrhea with lactose products.     History   Social History  . Marital Status: Widowed    Spouse Name: N/A    Number of Children: N/A  . Years of Education: N/A   Occupational History  . Retired    Social History Main Topics  . Smoking status: Former Smoker -- 0.10 packs/day for 15 years    Types: Cigarettes    Quit date: 04/21/2001  . Smokeless tobacco: Never Used     Comment: "smoked ~ 1  pack/month"  . Alcohol Use: 0.6 oz/week    1 Glasses of wine per week  . Drug Use: No  . Sexual Activity: Not Currently   Other Topics Concern  . Not on file   Social History Narrative   Lives alone, has a house, has some household help. Handicapped upfitted.     Family History  Problem Relation Age of Onset  . Diabetes Mother   . Diabetes Brother   . Arthritis      family history  . Prostate cancer      Family History    Physical Exam: Filed Vitals:   01/16/14 1645 01/16/14 1700 01/16/14 1720 01/16/14 1758  BP: 105/60 101/64 95/59 104/68  Pulse: 74 79 73 74  Temp:  97.7 F (36.5 C)  TempSrc:    Oral  Resp: 14 19  20   Height:    5\' 4"  (1.626 m)  Weight:    131 lb 14.4 oz (59.829 kg)  SpO2: 100% 100% 100% 95%    GEN- The patient is chronically ill appearing, alert and oriented x 3 today.   Head- normocephalic, atraumatic Eyes-  Sclera clear, conjunctiva pink Ears- hearing intact Oropharynx- clear Neck- supple, Lungs- Clear to ausculation bilaterally, normal work of breathing Heart- Regular rate and rhythm  GI- soft, NT, ND, + BS Extremities- no clubbing, cyanosis, or edema  MS- diffuse muscle atrophy Skin- no rash or lesion Psych- euthymic mood, full affect Neuro- strength and sensation are intact ICD pocket is well healed   Labs:   Lab Results  Component Value Date   WBC 4.2 01/16/2014   HGB 11.6* 01/16/2014   HCT 34.5* 01/16/2014   MCV 101.5* 01/16/2014   PLT 134* 01/16/2014    Recent Labs Lab 01/16/14 1325  NA 140  K 4.0  CL 98  CO2 22  BUN 29*  CREATININE 1.28*  CALCIUM 8.1*  GLUCOSE 167*    EKG reviewed  TELEMETRY reviewed  DEVICE HISTORY: ICD interrogation is reviewed and reveals normal device function.  She was programmed with a long AV and therefore did have reduced BiV pacing.  I have therefore reduced AV delay today.  The patient has had AF post ablation but is in sinus rhythm today.  She had VT today with CL of 340 msec.  I  have therefore reprogrammed by adding a VT zone at 350 msec today.  A/P 1. VT The patient presents with hemodynamically significant VT for which she required cardioversion emergently in the ER.  She has been placed on IV amiodarone.  She is reasonably stable but remains hypotensive.  I have reviewed her ICD interrogation at length and have reprogrammed by adding a VT zone at 171 bpm (previously VF zone only at 200 bpm).  Continue IV amiodarone tonight and then return to oral amiodarone tomorrow am.  Hypotension presently limits our ability to add further beta blockade. No driving x 6 months  2. afib She underwent recent ablation.  Device interrogation reveals some ERAF.  Continue long term anticoagulation. Continue amiodarone  3. CAD No ischemic symptoms Resume home medicine  4. Ischemic CM Continue medical therapy She is presently hypotensive and quite ill.  Our ability to titrate her medicine in limited.  The patient is quite ill after admission for hemodynamically unstable VT.  She is at risk for decompensation and therefore will be admitted for further arrhythmia management.

## 2014-01-16 NOTE — ED Notes (Signed)
Cardiology at bedside.

## 2014-01-16 NOTE — ED Notes (Signed)
Attempted report to floor.  RN from floor to call back.

## 2014-01-16 NOTE — ED Notes (Signed)
Cardiologist approved giving patient ginger ale.  Delivered to pt.

## 2014-01-16 NOTE — H&P (Signed)
History and Physical   Patient ID: [name] MRN: [id number], DOB/AGE: 10/23/1950 63 y.o. Date of Encounter: 01/16/2014  Primary Physician: [name], MD Primary Cardiologist:   Chief Complaint:  VT  HPI: [name] is a 63 y.o. female with a history of CAD/ICM, HTN, Hypothyroid, BiV-ICD w/ inapropriate shocks secondary to afib. She saw Dr. Ola Spurr at [facility] and underwent successful AF/flutter ablation with one LA tachycardia successfully ablated. The AV node was also modified to lessen the need for AV nodal blocking agents and to promote biventricular pacing. D/C 09/04. She has done well since then.    Yesterday, she felt a little tired, but otherwise OK. At about 3 am, she developed PND, acute on chronic orthopnea, no LE edema. No chest pain at that time.   This am, SOB was still present, she woke with significant diaphoresis and chest pain. She was nauseated and has vomited at times. She felt her heart beating very fast. She took SL NTG x 1 at 6 am. The pain did not radiate, it was in her middle/right chest and went through to her back. 9/10 at its worst. The pain got a little better with the nitro. She took her heart rate and it was up to 200 at times, always >160. She took all her am meds. PT came by and she was too weak to work with them. PT called Dr. Ola Spurr and came to the ER as requested. She was in VT, and was cardioverted with 100 J, to SR. She tolerated the procedure well.  Post-DCCV, she still has chest pain 5/10, still SOB (OK on O2), still with N&V. She is generally better, but nowhere near baseline.   Past Medical History  Diagnosis Date  . Spinal stenosis   . Numbness of foot   . Diverticulitis     a. s/p partial colectomy 1/12 with reversal in July 2012.  . Colitis, ischemic   . DJD (degenerative joint disease)   . Ischemic cardiomyopathy     a. Echo 06/16/10: EF 25-30%, anteroseptal and apical hypokinesis, moderate AI, mild MR.   . LV (left  ventricular) mural thrombus   . CKD (chronic kidney disease), stage III   . GERD (gastroesophageal reflux disease)   . Hypertension   . Hypothyroidism   . Hyperlipidemia   . Compression fracture     a. of L2  . 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 2/2 atrial fibrillation with RVR in the context of hypokalemia  . PAF (paroxysmal atrial fibrillation)     a. on Eliquis, digoxin, and amiodarone  . Anemia   . H/O hiatal hernia   . Sinus bradycardia   . Mitral regurgitation   . Chronic systolic CHF (congestive heart failure)   . CAD (coronary artery disease)     a.  Ant MI 8/03 with stenting to LAD;  b. staged PCI w/ DES to OM1 8/03;  c. s/p DES to LAD 7/04;  d.  multiple caths in past (chronically abnl ECG); e. cardiac cath 3/12: LAD stent patent, distal LAD 40-50%, small ostial D1 80%, ostial D2 60%, OM-1 stent patent (20-30%), proximal RCA 50%, prox- mRCA 40-50%; f. cath 2012 nonobs g. cath 06/2013 and 08/2013 for "STEMI" - mild nonobst CAD only.  [name] History of TIA (transient ischemic attack)   . 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 s/p ICD implantation in 2005. b. Gen change 2006. Dual  chamber upgrade 2014 with subsequent extraction due to infection 11/2012 -> Lifevest. c. s/p CRT-D implantation 07/2013.  [name] Long term (current) use of anticoagulants   . Paroxysmal atrial tachycardia   . Paroxysmal atrial flutter   . Stroke   . Osteoporosis   . Oral thrush     a. By EGD 09/2013.  [name] Antral gastritis     a. By EGD 09/2013 - minimal.    Surgical History:  Past Surgical History  Procedure Laterality Date  . Back surgery    . Cardiac defibrillator placement  07/2003; 10/2004; 2014  . Lumbar laminectomy/decompression microdiscectomy  10/2001    L5-S1/E-chart  . Knee arthroscopy Right   . Thyroidectomy  1970's  . Total knee arthroplasty Left 11/2003  . X-stop implantation  12/2004    L3-4; L4-5  . Fixation kyphoplasty  thoracic spine  07/2007    T3, 4, 6 compression fractures  . Shoulder open rotator cuff repair Right 04/2008  . Lumbar laminectomy/decompression microdiscectomy  01/2010  . Colectomy  03/2010    sigmoid left; transverse  . Peripherally inserted central catheter insertion  03/2010    removed upon discharge  . Colostomy reversal  12/2010  . Tonsillectomy and adenoidectomy  1969  . Appendectomy  03/2010  . Glaucoma surgery Right   . Cardiac defibrillator removal  12/17/2012    removed for infection  . Icd lead removal Left 12/17/2012    Procedure: ICD LEAD REMOVAL;  Surgeon: Evans Lance, MD;  Location: [facility];  Service: Cardiovascular;  Laterality: Left;  . Coronary angioplasty with stent placement  10/2001; 09/2002; ?date    "I've got a total of 4" (04/10/2013)  . Bi-ventricular implantable cardioverter defibrillator  (crt-d)  08/05/2013    STJ Quadra Assura CRTD implanted by Dr Caryl Comes (right sided)  . Esophagogastroduodenoscopy N/A 10/02/2013    Procedure: ESOPHAGOGASTRODUODENOSCOPY (EGD);  Surgeon: Lear Ng, MD;  Location: [facility] ENDOSCOPY;  Service: Endoscopy;  Laterality: N/A;     I have reviewed the patient's current medications. Medication Sig  amiodarone (PACERONE) 200 MG tablet Take 200 mg by mouth daily.  apixaban (ELIQUIS) 5 MG TABS tablet Take 1 tablet (5 mg total) by mouth 2 (two) times daily.  carvedilol (COREG) 3.125 MG tablet Take 1 tablet (3.125 mg total) by mouth 2 (two) times daily with a meal.  clonazePAM (KLONOPIN) 0.5 MG tablet Take 1 tablet (0.5 mg total) by mouth 2 (two) times daily as needed for anxiety.  digoxin (LANOXIN) 0.125 MG tablet Take 0.5 tablets (0.0625 mg total) by mouth daily.  diltiazem (CARDIZEM CD) 120 MG 24 hr capsule Take 120 mg by mouth.  fexofenadine (ALLEGRA) 180 MG tablet Take 180 mg by mouth daily as needed for allergies.   fluticasone (FLONASE) 50 MCG/ACT nasal spray Place 2 sprays into both nostrils daily as needed for allergies.   folic  acid-pyridoxine-cyancobalamin (FOLTX) 2.5-25-2 MG TABS Take 1 tablet by mouth daily.   furosemide (LASIX) 40 MG tablet Take 1 tablet (40 mg total) by mouth daily.  levothyroxine (SYNTHROID, LEVOTHROID) 200 MCG tablet Take 200 mcg by mouth daily before breakfast.   nitroGLYCERIN (NITROSTAT) 0.4 MG SL tablet Place 0.4 mg under the tongue every 5 (five) minutes as needed for chest pain.   nystatin (MYCOSTATIN) 100000 UNIT/ML suspension Take 5 mLs (500,000 Units total) by mouth 4 (four) times daily. For 7 days. Swish and swallow.  omega-3 acid ethyl esters (LOVAZA) 1 G capsule Take 1 g by mouth daily.  ondansetron (ZOFRAN-ODT)  4 MG disintegrating tablet Take 4 mg by mouth 2 (two) times daily as needed for nausea or vomiting.  pantoprazole (PROTONIX) 40 MG tablet Take 1 tablet (40 mg total) by mouth 2 (two) times daily.  polyethylene glycol (MIRALAX / GLYCOLAX) packet Take 17 g by mouth daily as needed (for constipation).   raloxifene (EVISTA) 60 MG tablet Take 60 mg by mouth daily.  spironolactone (ALDACTONE) 25 MG tablet Take 25 mg by mouth daily.   Scheduled Meds: . fentaNYL      . midazolam       Continuous Infusions: . sodium chloride Stopped (01/16/14 1351)   PRN Meds:.sodium chloride, ondansetron (ZOFRAN) IV  Allergies:  Allergies  Allergen Reactions  . Coconut Oil Anaphylaxis and Hives  . Lansoprazole Nausea Only  . Penicillins Swelling    Throat swells  . Statins Other (See Comments)    cramps  . Lisinopril     Cough- but still tolerates it  . Lactose Intolerance (Gi) Diarrhea and Other (See Comments)    Patient reports abdominal cramping and diarrhea with lactose products.    History   Social History  . Marital Status: Widowed    Spouse Name: N/A    Number of Children: N/A  . Years of Education: N/A   Occupational History  . Retired    Social History Main Topics  . Smoking status: Former Smoker -- 0.10 packs/day for 15 years    Types: Cigarettes    Quit date:  04/21/2001  . Smokeless tobacco: Never Used     Comment: "smoked ~ 1 pack/month"  . Alcohol Use: 0.6 oz/week    1 Glasses of wine per week  . Drug Use: No  . Sexual Activity: Not Currently   Other Topics Concern  . Not on file   Social History Narrative   Lives alone, has a house, has some household help. Handicapped upfitted.    Family History  Problem Relation Age of Onset  . Diabetes Mother   . Diabetes Brother   . Arthritis      family history  . Prostate cancer      Family History   Family Status  Relation Status Death Age  . Mother Deceased   . Father Deceased   . Brother Alive     Review of Systems:   Full 14-point review of systems otherwise negative except as noted above.  Physical Exam: Blood pressure 94/79, pulse 75, resp. rate 12, SpO2 100.00%. General: Well developed, well nourished,female in no acute distress. Head: Normocephalic, atraumatic, sclera non-icteric, no xanthomas, nares are without discharge. Dentition: good Neck: No carotid bruits. JVD not elevated. No thyromegally Lungs: Good expansion bilaterally. without wheezes or rhonchi.  Heart: Regular rate and rhythm with S1 S2.  No S3 or S4. Apical murmur, no rubs, or gallops appreciated. Abdomen: Soft, non-tender, non-distended with normoactive bowel sounds. No hepatomegaly. No rebound/guarding. No obvious abdominal masses. Msk:  Strength and tone appear normal for age. No joint deformities or effusions, no spine or costo-vertebral angle tenderness. Extremities: No clubbing or cyanosis. No edema.  Distal pedal pulses are 2+ in 4 extrem Neuro: Alert and oriented X 3. Moves all extremities spontaneously. No focal deficits noted. Psych:  Responds to questions appropriately with a normal affect. Skin: No rashes or lesions noted  Labs:   Lab Results  Component Value Date   WBC 4.2 01/16/2014   HGB 11.6* 01/16/2014   HCT 34.5* 01/16/2014   MCV 101.5* 01/16/2014   PLT 134* 01/16/2014  Recent  Labs Lab 01/16/14 1325  NA 140  K 4.0  CL 98  CO2 22  BUN 29*  CREATININE 1.28*  CALCIUM 8.1*  GLUCOSE 167*   Magnesium  Date Value Ref Range Status  01/16/2014 1.7  1.5 - 2.5 mg/dL Final    Recent Labs  01/16/14 1325  TROPONINI 0.30*   Lab Results  Component Value Date   CHOL 226* 04/22/2013   HDL 119 04/22/2013   LDLCALC 93 04/22/2013   TRIG 68 04/22/2013   Radiology/Studies: Dg Chest Portable 1 View 01/16/2014   CLINICAL DATA:  Chest discomfort which shortness of Breath  EXAM: PORTABLE CHEST - 1 VIEW  COMPARISON:  09/29/2013  FINDINGS: Cardiac shadow is mildly enlarged. A pacing device is again identified and stable. Multiple monitoring devices are seen. Changes of prior vertebral augmentation are again noted. The lungs are clear bilaterally.  IMPRESSION: Chronic changes without acute abnormality.   Electronically Signed   By: Inez Catalina M.D.   On: 01/16/2014 14:40   Cardiac Cath:07/06/2013 Left main: Distal 20% stenosis.  Left Anterior Descending Artery: Large caliber vessel that courses to the apex. The proximal vessel has calcification with 20% stenosis. The mid vessel has a patent stented segment with minimal restenosis. The distal vessel has a 30% stenosis. The first diagonal branch is small in caliber with ostial 80% stenosis, unchanged from last cath. The second diagonal branch is small in caliber with ostial 30% stenosis.  Circumflex Artery: Large caliber vessel with large caliber first obtuse marginal branch and moderate caliber second obtuse marginal branch. There is a patent stent in the first OM branch with minimal restenosis. The mid AV groove Circumflex has 20% stenosis.  Right Coronary Artery: Large caliber dominant vessel with diffuse 30% stenosis in the proximal and mid vessel. There is diffuse calcification in the proximal and mid vessel. There is subtle flow disturbance in the RCA suggestive of microvascular disease.  Left Ventricular Angiogram: Deferred.    Impression:  1. Double vessel CAD with patent stents mid LAD and OM1, mild disease RCA  2. 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 Known ischemic cardiomyopathy  Recommendations: Will admit to telemetry unit. No evidence of ACS. After reviewing the films today and old cath films, she has slightly decreased flow in her coronaries without obstructive large vessel disease. This is suggestive of micro-vascular disease. She may benefit from augmentation of therapy to treat microvascular disease. Resume home meds. If any further neurological changes, will need Neuro consult.   Echo: 08/06/2013 Study Conclusions - Left ventricle: The cavity size was mildly dilated. Wall thickness was normal. Systolic function was severely reduced. The estimated ejection fraction was in the range of 20% to 25%. There is akinesis of the mid-apicalanteroseptal and inferior myocardium. Doppler parameters are consistent with abnormal left ventricular relaxation (grade 1 diastolic dysfunction). - Aortic valve: There was trivial regurgitation. - Mitral valve: Calcified annulus. There was mild regurgitation. - Right ventricle: The cavity size was mildly dilated. Wall thickness was normal.  ECG: initial ECG - VT (right axis, neg inferior lead deflection) Subsequent ECG - SR, No acute ischemic changes  ASSESSMENT AND PLAN:  Principal Problem:   Sustained VT (ventricular tachycardia) - admit, continue IV amio, EP to see. Her potassium is OK, but Mg a little low, will start oral supplement. Recent NUC stress with large area of scar, no ischemia. Cath after that with no critical CAD. Scar in myocardium likely cause of VT. ECG with no acute ischemic changes. Continue Amio  IV for now. Pt may benefit from increasing her home dose 200 mg-->400 mg. She may also be a VT ablation candidate. Will leave to EP.   Active Problems:   Chronic combined systolic and diastolic heart failure, NYHA class 3 - Pt was SOB, but does not appear  volume overloaded by exam. SOB may have been 2nd VT, continue Lasix, aldactone, Coreg    Ischemic cardiomyopathy - see above, not on ACE/ARB due to poor renal function. Not on ASA due to anticoagulation w/ Eliquis.    CKD (chronic kidney disease) - Follow renal function    CAD (coronary artery disease) - recent cath report above, no ischemic eval planned.    Hypothyroidism - ck TSH, continue current dose of Synthroid    Current use of long term anticoagulation/PAF - ablation at [facility] as above.  Continue Eliquis for now. Will need to hold if EP procedure takes place.    Jonetta Speak, PA-C 01/16/2014 3:39 PM Beeper [phone]  Personally seen and examined. Agree with above. Changes to note made.   Candee Furbish, MD

## 2014-01-16 NOTE — ED Provider Notes (Signed)
CSN: [id number]     Arrival date & time 01/16/14  1258 History   First MD Initiated Contact with Patient 01/16/14 1258     Chief Complaint  Patient presents with  . Chest Pain     (Consider location/radiation/quality/duration/timing/severity/associated sxs/prior Treatment) HPI 63 year old female presents with chest pressure and indigestion-like feeling along with shortness of breath and nausea since last night. This feels like prior heart attacks. Patient is also feeling dizzy. She's had multiple stents and currently has a pacemaker with defibrillator. When told she is in ventricular tachycardia she notes that her defibrillator rate is set to shock at 210. She has atrophic fibrillation as well. The patient was given 150 mg amiodarone bolus by EMS and feels somewhat better but still having significant pain.  Past Medical History  Diagnosis Date  . Spinal stenosis   . Numbness of foot   . Diverticulitis     a. s/p partial colectomy 1/12 with reversal in July 2012.  . Colitis, ischemic   . DJD (degenerative joint disease)   . Ischemic cardiomyopathy     a. Echo 06/16/10: EF 25-30%, anteroseptal and apical hypokinesis, moderate AI, mild MR.   . LV (left ventricular) mural thrombus   . CKD (chronic kidney disease), stage III   . GERD (gastroesophageal reflux disease)   . Hypertension   . Hypothyroidism   . Hyperlipidemia   . Compression fracture     a. of L2  . 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 2/2 atrial fibrillation with RVR in the context of hypokalemia  . PAF (paroxysmal atrial fibrillation)     a. on Eliquis, digoxin, and amiodarone  . Anemia   . H/O hiatal hernia   . Sinus bradycardia   . Mitral regurgitation   . Chronic systolic CHF (congestive heart failure)   . CAD (coronary artery disease)     a.  Ant MI 8/03 with stenting to LAD;  b. staged PCI w/ DES to OM1 8/03;  c. s/p DES to LAD 7/04;  d.  multiple caths in past  (chronically abnl ECG); e. cardiac cath 3/12: LAD stent patent, distal LAD 40-50%, small ostial D1 80%, ostial D2 60%, OM-1 stent patent (20-30%), proximal RCA 50%, prox- mRCA 40-50%; f. cath 2012 nonobs g. cath 06/2013 and 08/2013 for "STEMI" - mild nonobst CAD only.  [name] History of TIA (transient ischemic attack)   . 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 s/p ICD implantation in 2005. b. Gen change 2006. 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 c. s/p CRT-D implantation 07/2013.  [name] Long term (current) use of anticoagulants   . Paroxysmal atrial tachycardia   . Paroxysmal atrial flutter   . Stroke   . Osteoporosis   . Oral thrush     a. By EGD 09/2013.  [name] Antral gastritis     a. By EGD 09/2013 - minimal.   Past Surgical History  Procedure Laterality Date  . Back surgery    . Cardiac defibrillator placement  07/2003; 10/2004; 2014  . Lumbar laminectomy/decompression microdiscectomy  10/2001    L5-S1/E-chart  . Knee arthroscopy Right   . Thyroidectomy  1970's  . Total knee arthroplasty Left 11/2003  . X-stop implantation  12/2004    L3-4; L4-5  . Fixation kyphoplasty thoracic spine  07/2007    T3, 4, 6 compression fractures  . Shoulder open rotator cuff repair Right 04/2008  .  Lumbar laminectomy/decompression microdiscectomy  01/2010  . Colectomy  03/2010    sigmoid left; transverse  . Peripherally inserted central catheter insertion  03/2010    removed upon discharge  . Colostomy reversal  12/2010  . Tonsillectomy and adenoidectomy  1969  . Appendectomy  03/2010  . Glaucoma surgery Right   . Cardiac defibrillator removal  12/17/2012    removed for infection  . Icd lead removal Left 12/17/2012    Procedure: ICD LEAD REMOVAL;  Surgeon: Evans Lance, MD;  Location: [facility];  Service: Cardiovascular;  Laterality: Left;  . Coronary angioplasty with stent placement  10/2001; 09/2002; ?date    "I've got a total of 4"  (04/10/2013)  . Bi-ventricular implantable cardioverter defibrillator  (crt-d)  08/05/2013    STJ Quadra Assura CRTD implanted by Dr Caryl Comes (right sided)  . Esophagogastroduodenoscopy N/A 10/02/2013    Procedure: ESOPHAGOGASTRODUODENOSCOPY (EGD);  Surgeon: Lear Ng, MD;  Location: [facility] ENDOSCOPY;  Service: Endoscopy;  Laterality: N/A;   Family History  Problem Relation Age of Onset  . Diabetes Mother   . Diabetes Brother   . Arthritis      family history  . Prostate cancer      Family History   History  Substance Use Topics  . Smoking status: Former Smoker -- 0.10 packs/day for 15 years    Types: Cigarettes    Quit date: 04/21/2001  . Smokeless tobacco: Never Used     Comment: "smoked ~ 1 pack/month"  . Alcohol Use: 0.6 oz/week    1 Glasses of wine per week   OB History   Grav Para Term Preterm Abortions TAB SAB Ect Mult Living                 Review of Systems  Unable to perform ROS: Unstable vital signs      Allergies  Coconut oil; Lansoprazole; Penicillins; Statins; Lisinopril; and Lactose intolerance (gi)  Home Medications   Prior to Admission medications   Medication Sig Start Date End Date Taking? Authorizing Provider  amiodarone (PACERONE) 200 MG tablet Take 200 mg by mouth daily.    Historical Provider, MD  apixaban (ELIQUIS) 5 MG TABS tablet Take 1 tablet (5 mg total) by mouth 2 (two) times daily. 09/02/13   Jolaine Artist, MD  carvedilol (COREG) 3.125 MG tablet Take 1 tablet (3.125 mg total) by mouth 2 (two) times daily with a meal. 10/03/13   Dayna N Dunn, PA-C  clonazePAM (KLONOPIN) 0.5 MG tablet Take 1 tablet (0.5 mg total) by mouth 2 (two) times daily as needed for anxiety. 09/16/13   Janith Lima, MD  digoxin (LANOXIN) 0.125 MG tablet Take 0.5 tablets (0.0625 mg total) by mouth daily. 06/18/13   Rande Brunt, NP  diltiazem (CARDIZEM CD) 120 MG 24 hr capsule Take 120 mg by mouth. 10/31/13 10/31/14  Historical Provider, MD  fexofenadine (ALLEGRA)  180 MG tablet Take 180 mg by mouth daily as needed for allergies.     Historical Provider, MD  fluticasone (FLONASE) 50 MCG/ACT nasal spray Place 2 sprays into both nostrils daily as needed for allergies.     Historical Provider, MD  folic acid-pyridoxine-cyancobalamin (FOLTX) 2.5-25-2 MG TABS Take 1 tablet by mouth daily.     Historical Provider, MD  furosemide (LASIX) 40 MG tablet Take 1 tablet (40 mg total) by mouth daily. 06/11/13   Amy D Clegg, NP  levothyroxine (SYNTHROID, LEVOTHROID) 200 MCG tablet Take 200 mcg by mouth daily before breakfast.  Historical Provider, MD  nitroGLYCERIN (NITROSTAT) 0.4 MG SL tablet Place 0.4 mg under the tongue every 5 (five) minutes as needed for chest pain.     Historical Provider, MD  nystatin (MYCOSTATIN) 100000 UNIT/ML suspension Take 5 mLs (500,000 Units total) by mouth 4 (four) times daily. For 7 days. Swish and swallow. 10/03/13   Dayna N Dunn, PA-C  omega-3 acid ethyl esters (LOVAZA) 1 G capsule Take 1 g by mouth daily.    Historical Provider, MD  ondansetron (ZOFRAN-ODT) 4 MG disintegrating tablet Take 4 mg by mouth 2 (two) times daily as needed for nausea or vomiting.    Historical Provider, MD  pantoprazole (PROTONIX) 40 MG tablet Take 1 tablet (40 mg total) by mouth 2 (two) times daily. 07/12/13   Janith Lima, MD  polyethylene glycol [facility] / Floria Raveling) packet Take 17 g by mouth daily as needed (for constipation).     Historical Provider, MD  raloxifene (EVISTA) 60 MG tablet Take 60 mg by mouth daily.    Historical Provider, MD  spironolactone (ALDACTONE) 25 MG tablet Take 25 mg by mouth daily. 08/07/13   Dayna N Dunn, PA-C   BP 85/49  Pulse 175  Resp 9  SpO2 100% Physical Exam  Nursing note and vitals reviewed. Constitutional: She is oriented to person, place, and time. She appears well-developed and well-nourished.  HENT:  Head: Normocephalic and atraumatic.  Right Ear: External ear normal.  Left Ear: External ear normal.  Nose: Nose  normal.  Eyes: Right eye exhibits no discharge. Left eye exhibits no discharge.  Neck: Neck supple.  Cardiovascular: Regular rhythm and normal heart sounds.  Tachycardia present.   Pulses:      Radial pulses are 1+ on the right side, and 1+ on the left side.  Pulmonary/Chest: Effort normal and breath sounds normal.  Abdominal: Soft. She exhibits no distension. There is no tenderness.  Musculoskeletal: She exhibits no edema.  Neurological: She is alert and oriented to person, place, and time.  Skin: Skin is warm.    ED Course  CARDIOVERSION Date/Time: 01/16/2014 1:24 PM Performed by: Ephraim Hamburger Authorized by: Sherwood Gambler T Consent: Verbal consent obtained. Risks and benefits: risks, benefits and alternatives were discussed Consent given by: patient Required items: required blood products, implants, devices, and special equipment available Patient identity confirmed: verbally with patient Time out: Immediately prior to procedure a "time out" was called to verify the correct patient, procedure, equipment, support staff and site/side marked as required. Cardioversion basis: emergent Pre-procedure rhythm: ventricular tachycardia Patient position: patient was placed in a supine position Chest area: chest area exposed Electrodes: pads Electrodes placed: anterior-posterior Number of attempts: 1 Attempt 1 mode: synchronous Attempt 1 waveform: biphasic Attempt 1 shock (in Joules): 100 Post-procedure rhythm: normal sinus rhythm Complications: no complications Patient tolerance: Patient tolerated the procedure well with no immediate complications.   (including critical care time) Labs Review Labs Reviewed  CBC WITH DIFFERENTIAL - Abnormal; Notable for the following:    RBC 3.40 (*)    Hemoglobin 11.6 (*)    HCT 34.5 (*)    MCV 101.5 (*)    MCH 34.1 (*)    RDW 18.9 (*)    Platelets 134 (*)    Monocytes Relative 17 (*)    All other components within normal limits   BASIC METABOLIC PANEL - Abnormal; Notable for the following:    Glucose, Bld 167 (*)    BUN 29 (*)    Creatinine, Ser 1.28 (*)  Calcium 8.1 (*)    GFR calc non Af Amer 43 (*)    GFR calc Af Amer 50 (*)    Anion gap 20 (*)    All other components within normal limits  TROPONIN I - Abnormal; Notable for the following:    Troponin I 0.30 (*)    All other components within normal limits  MAGNESIUM    Imaging Review Dg Chest Portable 1 View  01/16/2014   CLINICAL DATA:  Chest discomfort which shortness of Breath  EXAM: PORTABLE CHEST - 1 VIEW  COMPARISON:  09/29/2013  FINDINGS: Cardiac shadow is mildly enlarged. A pacing device is again identified and stable. Multiple monitoring devices are seen. Changes of prior vertebral augmentation are again noted. The lungs are clear bilaterally.  IMPRESSION: Chronic changes without acute abnormality.   Electronically Signed   By: Inez Catalina M.D.   On: 01/16/2014 14:40     EKG Interpretation   Date/Time:  Thursday January 16 2014 13:09:31 EDT Ventricular Rate:  176 PR Interval:    QRS Duration: 79 QT Interval:  331 QTC Calculation: 566 R Axis:   -162 Text Interpretation:  Ventricular tachycardia Confirmed by Christie Copley  MD,  Boris Engelmann ([id number]) on 01/16/2014 1:24:18 PM      MDM   Final diagnoses:  Ventricular tachycardia    Patient resents with ventricular tachycardia. Initial blood pressure is borderline around 90. I discussed with cardiology who was working on possibly overdrive pacing to get her defibrillator to fire to cardiovert her arrhythmia. However patient dropped her pressure quickly down into the 50s which came up with some fluids. Due to this as well as having chest pain the patient is considered an unstable ventricular tachycardia and was cardioverted with single denies cardioversion as above after fentanyl and Versed. Patient tolerated this well and blood pressure normalized. Heart rhythm changed to normal sinus rhythm. She was  given aspirin. Started on amiodarone infusion and cardiology was consult and will admit.    Ephraim Hamburger, MD 01/16/14 [phone]

## 2014-01-17 ENCOUNTER — Encounter (HOSPITAL_COMMUNITY): Payer: Self-pay | Admitting: General Practice

## 2014-01-17 DIAGNOSIS — I472 Ventricular tachycardia: Principal | ICD-10-CM

## 2014-01-17 LAB — COMPREHENSIVE METABOLIC PANEL
ALK PHOS: 80 U/L (ref 39–117)
ALT: 27 U/L (ref 0–35)
ANION GAP: 19 — AB (ref 5–15)
AST: 77 U/L — ABNORMAL HIGH (ref 0–37)
Albumin: 3 g/dL — ABNORMAL LOW (ref 3.5–5.2)
BILIRUBIN TOTAL: 0.7 mg/dL (ref 0.3–1.2)
BUN: 31 mg/dL — AB (ref 6–23)
CHLORIDE: 96 meq/L (ref 96–112)
CO2: 20 mEq/L (ref 19–32)
Calcium: 7.7 mg/dL — ABNORMAL LOW (ref 8.4–10.5)
Creatinine, Ser: 1.47 mg/dL — ABNORMAL HIGH (ref 0.50–1.10)
GFR calc non Af Amer: 37 mL/min — ABNORMAL LOW (ref >90)
GFR, EST AFRICAN AMERICAN: 42 mL/min — AB (ref >90)
GLUCOSE: 106 mg/dL — AB (ref 70–99)
Potassium: 4 mEq/L (ref 3.7–5.3)
Sodium: 135 mEq/L — ABNORMAL LOW (ref 137–147)
TOTAL PROTEIN: 6.4 g/dL (ref 6.0–8.3)

## 2014-01-17 NOTE — Progress Notes (Signed)
Pt had periods where her rate was between 130-150's and then her rate would return to normal. Pt reported some SOB and was put on 3 L [place]. Paged doctor. Will continue to monitor  Shanon Rosser, RN

## 2014-01-17 NOTE — Progress Notes (Signed)
Patient ID: CONNEE IKNER, female   DOB: 1950/08/20, 63 y.o.   MRN: [id number]   Patient Name: [name] Date of Encounter: 01/17/2014     Principal Problem:   Sustained VT (ventricular tachycardia) Active Problems:   Chronic combined systolic and diastolic heart failure, NYHA class 3   Ischemic cardiomyopathy   CKD (chronic kidney disease)   CAD (coronary artery disease)   Hypothyroidism   Current use of long term anticoagulation   VT (ventricular tachycardia)    SUBJECTIVE  C/o incisional soreness but no chest pain or sob.  CURRENT MEDS . amiodarone  60 mg/hr Intravenous Once  . apixaban  5 mg Oral BID  . carvedilol  3.125 mg Oral BID WC  . furosemide  40 mg Oral Daily  . levothyroxine  200 mcg Oral QAC breakfast  . loratadine  10 mg Oral Daily  . omega-3 acid ethyl esters  1 g Oral Daily  . pantoprazole  40 mg Oral BID  . raloxifene  60 mg Oral Daily  . sodium chloride  3 mL Intravenous Q12H  . spironolactone  25 mg Oral Daily    OBJECTIVE  Filed Vitals:   01/17/14 0817 01/17/14 1014 01/17/14 1122 01/17/14 1345  BP: 100/58 98/56 90/54  85/57  Pulse: 71   56  Temp:    97.8 F (36.6 C)  TempSrc:    Oral  Resp:    18  Height:      Weight:      SpO2:    100%    Intake/Output Summary (Last 24 hours) at 01/17/14 1623 Last data filed at 01/17/14 0900  Gross per 24 hour  Intake    243 ml  Output      0 ml  Net    243 ml   Filed Weights   01/16/14 1758 01/17/14 0501  Weight: 131 lb 14.4 oz (59.829 kg) 136 lb 3.2 oz (61.78 kg)    PHYSICAL EXAM  General: Pleasant, NAD. Neuro: Alert and oriented X 3. Moves all extremities spontaneously. HEENT:  Normal  Neck: Supple without bruits or JVD. Lungs:  Resp regular and unlabored, CTA. Heart: RRR no s3, s4, or murmurs. Abdomen: Soft, non-tender, non-distended, BS + x 4.  Extremities: No clubbing, cyanosis or edema. DP/PT/Radials 2+ and equal bilaterally.  Accessory Clinical Findings  CBC  Recent  Labs  01/16/14 1325  WBC 4.2  NEUTROABS 2.5  HGB 11.6*  HCT 34.5*  MCV 101.5*  PLT 498*   Basic Metabolic Panel  Recent Labs  01/16/14 1325 01/17/14 0318  NA 140 135*  K 4.0 4.0  CL 98 96  CO2 22 20  GLUCOSE 167* 106*  BUN 29* 31*  CREATININE 1.28* 1.47*  CALCIUM 8.1* 7.7*  MG 1.7  --    Liver Function Tests  Recent Labs  01/17/14 0318  AST 77*  ALT 27  ALKPHOS 80  BILITOT 0.7  PROT 6.4  ALBUMIN 3.0*   No results found for this basename: LIPASE, AMYLASE,  in the last 72 hours Cardiac Enzymes  Recent Labs  01/16/14 1325  TROPONINI 0.30*   BNP No components found with this basename: POCBNP,  D-Dimer No results found for this basename: DDIMER,  in the last 72 hours Hemoglobin A1C No results found for this basename: HGBA1C,  in the last 72 hours Fasting Lipid Panel No results found for this basename: CHOL, HDL, LDLCALC, TRIG, CHOLHDL, LDLDIRECT,  in the last 72 hours Thyroid Function Tests  Recent Labs  01/16/14 2011  TSH 1.830    TELE  NSR with ventricular pacing as well as atrial fibrillation  Radiology/Studies  Dg Chest Portable 1 View  01/16/2014   CLINICAL DATA:  Chest discomfort which shortness of Breath  EXAM: PORTABLE CHEST - 1 VIEW  COMPARISON:  09/29/2013  FINDINGS: Cardiac shadow is mildly enlarged. A pacing device is again identified and stable. Multiple monitoring devices are seen. Changes of prior vertebral augmentation are again noted. The lungs are clear bilaterally.  IMPRESSION: Chronic changes without acute abnormality.   Electronically Signed   By: Inez Catalina M.D.   On: 01/16/2014 14:40    ASSESSMENT AND PLAN  1. Ventricular tachycardia under the device detection zone, s/p device reprogramming, now on amiodarone. 2. Chronic systolic heart failure 3. PAF, s/p ablation Rec: continue IV amiodarone for now with plans to stop IV amio tomorrow and switch to oral. Anticipate discharge tomorrow if stable on oral  amiodarone.  Gregg Taylor,M.D.  01/17/2014 4:23 PM

## 2014-01-17 NOTE — Care Management Note (Unsigned)
    Page 1 of 1   01/17/2014     10:37:03 AM CARE MANAGEMENT NOTE 01/17/2014  Patient:  [name],[name]   Account Number:  [id number]  Date Initiated:  01/17/2014  Documentation initiated by:  GRAVES-BIGELOW,Brewster Wolters  Subjective/Objective Assessment:   Pt admitted for Sustained VT (ventricular tachycardia) - admit, continue IV amio.     Action/Plan:   Pt was active with [facility] services for HHPT/ OT. Pt will benefit from [facility]. Please write resumption orders once stable for d/c.   Anticipated DC Date:  01/19/2014   Anticipated DC Plan:  [place]  CM consult      Choice offered to / List presented to:             Status of service:  In process, will continue to follow Medicare Important Message given?  YES (If response is "NO", the following Medicare IM given date fields will be blank) Date Medicare IM given:  01/17/2014 Medicare IM given by:  GRAVES-BIGELOW,Bambie Pizzolato Date Additional Medicare IM given:   Additional Medicare IM given by:    Discharge Disposition:    Per UR Regulation:  Reviewed for med. necessity/level of care/duration of stay  If discussed at [place] of Stay Meetings, dates discussed:    Comments:

## 2014-01-17 NOTE — Progress Notes (Signed)
UR Completed Amedeo Detweiler Graves-Bigelow, RN,BSN [phone]  

## 2014-01-18 ENCOUNTER — Inpatient Hospital Stay (HOSPITAL_COMMUNITY): Payer: Medicare Other

## 2014-01-18 LAB — BASIC METABOLIC PANEL
Anion gap: 17 — ABNORMAL HIGH (ref 5–15)
BUN: 32 mg/dL — AB (ref 6–23)
CO2: 19 meq/L (ref 19–32)
CREATININE: 1.56 mg/dL — AB (ref 0.50–1.10)
Calcium: 7.6 mg/dL — ABNORMAL LOW (ref 8.4–10.5)
Chloride: 94 mEq/L — ABNORMAL LOW (ref 96–112)
GFR calc Af Amer: 39 mL/min — ABNORMAL LOW (ref >90)
GFR calc non Af Amer: 34 mL/min — ABNORMAL LOW (ref >90)
GLUCOSE: 162 mg/dL — AB (ref 70–99)
Potassium: 3.7 mEq/L (ref 3.7–5.3)
SODIUM: 130 meq/L — AB (ref 137–147)

## 2014-01-18 MED ORDER — AMIODARONE HCL 200 MG PO TABS
200.0000 mg | ORAL_TABLET | Freq: Two times a day (BID) | ORAL | Status: DC
Start: 2014-01-18 — End: 2014-01-19
  Administered 2014-01-18 – 2014-01-19 (×3): 200 mg via ORAL
  Filled 2014-01-18 (×3): qty 1

## 2014-01-18 NOTE — Progress Notes (Signed)
SUBJECTIVE: The patient is doing well today.  She does have some increased shortness of breath.  No chest pain or palpitations.   I/O +1L; weight up 2 pounds from yesterday.    Creat 1.28 -->> 1.47 yesterday (repeat pending this morning)  CURRENT MEDICATIONS: . amiodarone  60 mg/hr Intravenous Once  . apixaban  5 mg Oral BID  . carvedilol  3.125 mg Oral BID WC  . furosemide  40 mg Oral Daily  . levothyroxine  200 mcg Oral QAC breakfast  . loratadine  10 mg Oral Daily  . omega-3 acid ethyl esters  1 g Oral Daily  . pantoprazole  40 mg Oral BID  . raloxifene  60 mg Oral Daily  . sodium chloride  3 mL Intravenous Q12H  . spironolactone  25 mg Oral Daily   . sodium chloride Stopped (01/16/14 1351)  . amiodarone 30 mg/hr (01/18/14 0419)    OBJECTIVE: Physical Exam: Filed Vitals:   01/17/14 2015 01/17/14 2300 01/18/14 0026 01/18/14 0425  BP:   83/54 104/75  Pulse: 92 78 74 73  Temp:    97.5 F (36.4 C)  TempSrc:    Oral  Resp: 24 22 19 18   Height:      Weight:    138 lb (62.596 kg)  SpO2: 100% 100% 100% 100%    Intake/Output Summary (Last 24 hours) at 01/18/14 0651 Last data filed at 01/18/14 0550  Gross per 24 hour  Intake  510.3 ml  Output    600 ml  Net  -89.7 ml    Telemetry reveals AV pacing, no further VT  GEN- The patient is well appearing, alert and oriented x 3 today.   Head- normocephalic, atraumatic Eyes-  Sclera clear, conjunctiva pink Ears- hearing intact Oropharynx- clear Neck- supple, no JVP Lymph- no cervical lymphadenopathy Lungs- Clear to ausculation bilaterally, normal work of breathing Heart- Regular rate and rhythm, no murmurs, rubs or gallops, PMI not laterally displaced GI- soft, NT, ND, + BS Extremities- no clubbing, cyanosis, or edema Skin- no rash or lesion Psych- euthymic mood, full affect Neuro- strength and sensation are intact  LABS: Basic Metabolic Panel:  Recent Labs  01/16/14 1325 01/17/14 0318  NA 140 135*  K  4.0 4.0  CL 98 96  CO2 22 20  GLUCOSE 167* 106*  BUN 29* 31*  CREATININE 1.28* 1.47*  CALCIUM 8.1* 7.7*  MG 1.7  --    Liver Function Tests:  Recent Labs  01/17/14 0318  AST 77*  ALT 27  ALKPHOS 80  BILITOT 0.7  PROT 6.4  ALBUMIN 3.0*   CBC:  Recent Labs  01/16/14 1325  WBC 4.2  NEUTROABS 2.5  HGB 11.6*  HCT 34.5*  MCV 101.5*  PLT 134*   Cardiac Enzymes:  Recent Labs  01/16/14 1325  TROPONINI 0.30*   Thyroid Function Tests:  Recent Labs  01/16/14 2011  TSH 1.830    RADIOLOGY: Dg Chest Portable 1 View 01/16/2014   CLINICAL DATA:  Chest discomfort which shortness of Breath  EXAM: PORTABLE CHEST - 1 VIEW  COMPARISON:  09/29/2013  FINDINGS: Cardiac shadow is mildly enlarged. A pacing device is again identified and stable. Multiple monitoring devices are seen. Changes of prior vertebral augmentation are again noted. The lungs are clear bilaterally.  IMPRESSION: Chronic changes without acute abnormality.   Electronically Signed   By: Inez Catalina M.D.   On: 01/16/2014 14:40    ASSESSMENT AND PLAN:  Principal Problem:  Sustained VT (ventricular tachycardia) Active Problems:   Chronic combined systolic and diastolic heart failure, NYHA class 3   Ischemic cardiomyopathy   CKD (chronic kidney disease)   CAD (coronary artery disease)   Hypothyroidism   Current use of long term anticoagulation   VT (ventricular tachycardia) worsening renal insufficiency 1.  VT  ICD reprogrammed 01-16-14 to detect slower ventricular tachycardia Change IV Amiodarone to po today.  Was on 200mg  daily at home, will increase to 400 daily in divided doses No driving X6 months  2.  Acute on chronic systolic heart failure Currently on Coreg, Furosemide, Spironolactone Hypotensive overnight Increased shortness of breath today with positive I/O and 2 pound weight gain since yesterday.  Creatinine increased from 1.28--> 1.47 yesterday, repeat BMET pending this morning; plan to repeat  her cxr and see if evidence of pulmonary edema.  3.  Atrial fibrillation Status post PVI  Anticoagulated with Eliquis  4. Disposition - hold off on discharge until fluid status and renal function clarified.   Mikle Bosworth.D.

## 2014-01-19 ENCOUNTER — Encounter (HOSPITAL_COMMUNITY): Payer: Self-pay | Admitting: Physician Assistant

## 2014-01-19 MED ORDER — FUROSEMIDE 40 MG PO TABS: 40.0000 mg | ORAL_TABLET | Freq: Every day | ORAL | Status: DC

## 2014-01-19 MED ORDER — AMIODARONE HCL 200 MG PO TABS: 200.0000 mg | ORAL_TABLET | Freq: Two times a day (BID) | ORAL | Status: DC

## 2014-01-19 NOTE — Plan of Care (Signed)
Problem: Discharge Progression Outcomes Goal: Complications resolved/controlled Outcome: Completed/Met Date Met:  01/19/14

## 2014-01-19 NOTE — Discharge Summary (Signed)
Discharge Summary   Patient ID: [name] MRN: [id number], DOB/AGE: October 26, 1950 63 y.o. Admit date: 01/16/2014 D/C date:     01/19/2014   Primary Cardiologist: Tamala Julian Heart Failure: [place]/Bensimhon EP: Dr. Caryl Comes  Principal Problem:   Sustained VT (ventricular tachycardia) Active Problems:   HLD (hyperlipidemia)   Chronic combined systolic and diastolic heart failure, NYHA class 3   Ischemic cardiomyopathy   Chronic anticoagulation   History of stroke   CKD (chronic kidney disease)   GERD (gastroesophageal reflux disease)   CAD (coronary artery disease)   Hypothyroidism   PAF (paroxysmal atrial fibrillation)    Admission Dates: 01/16/14-01/17/14 Discharge Diagnosis: Ventricular tachycardia s/p amiodarone loading and ICD reprogramming  [name] is a 63 y.o. female with a past medical history of ICM (s/p ICD implantation in 2005; gen change 2006; dual chamber upgrade 2014 with subsequent extraction due to infection 11/2012, CRT-D implant 11/238), chronic systolic heart failure, CAD last intervention 2004, last cath 06/2013 (patent the LAD and circumflex stents), myoview 04/2013 with no ischemia), prior CVA, chronic kidney disease and atrial arrhythmias who presented to [facility] on 01/16/14 with ventricular tachycardia.  She originally underwent ICD implantation in 2005, gen change in 2006, upgrade to dual chamber ICD in 2014 with subsequent infection and extraction. Now s/p CRTD implant May 2015. She has a history of previous inappropriate shocks for atrial arrhythmias. She underwent afib ablation by Dr Ola Spurr recently. She has done well s/p procedure and has been asymptomatic with ERAF. She presented with sustained symptomatic VT. Her VT was at 176 bpm and therefore was below her ICD detection rate.   Hospital Course  Sustained VT- She was hemodynamically unstable with her VT and therefore underwent emergent cardioversion in the ER.  -- She was placed on IV  amiodarone.  -- ICD reprogrammed 01-16-14 to detect slower ventricular tachycardia -- Changed IV Amiodarone to po today. Was on 200mg  daily at home, will increase to 400 daily in divided doses (BID) -- No driving X6 months  Acute on chronic systolic heart failure/Ischemic CM -- Currently on Coreg, Furosemide, Spironolactone -- Hypotension has resolved.  -- Increased shortness of breath today with positive I/O and 2 pound weight gain since yesterday.  -- Lasix bid for 3 days after discharge and then go back to lasix daily.   -- Continue BB and spironolactone 25mg  No ACE/ARB due to CKD.   CKD- Creatinine increased from 1.28--> 1.47 to 1.56  All suspected due to prolonged VT and prerenal azotemia. -- BMET at follow up appt in 1 week  Atrial fibrillation -- She underwent recent ablation at [facility]. Device interrogation reveals some ERAF.  -- Status post PVI  -- Anticoagulated with Eliquis  CAD -- No ischemic symptoms -- No ASA due to anticoagulation w/ Eliquis  Hypothyroidism- TSH normal on this admission  -- Continue synthroid  The patient has had an uncomplicated hospital course and is recovering well.  She has been seen by Dr. Lovena Le today and deemed ready for discharge home. A staff message has been sent for her to be seen as a TOC appointment in 1 week. Discharge medications are listed below.   Discharge Vitals: Blood pressure 92/65, pulse 70, temperature 97.7 F (36.5 C), temperature source Oral, resp. rate 18, height 5\' 4"  (1.626 m), weight 138 lb 12.8 oz (62.959 kg), SpO2 99 %.  Labs: Lab Results  Component Value Date   WBC 4.2 01/16/2014   HGB 11.6* 01/16/2014   HCT 34.5* 01/16/2014  MCV 101.5* 01/16/2014   PLT 134* 01/16/2014     Recent Labs Lab 01/17/14 0318 01/18/14 1000  NA 135* 130*  K 4.0 3.7  CL 96 94*  CO2 20 19  BUN 31* 32*  CREATININE 1.47* 1.56*  CALCIUM 7.7* 7.6*  PROT 6.4  --   BILITOT 0.7  --   ALKPHOS 80  --   ALT 27  --   AST 77*  --    GLUCOSE 106* 162*       Diagnostic Studies/Procedures   Dg Chest 2 View  01/18/2014   CLINICAL DATA:  Heart failure.  EXAM: CHEST  2 VIEW  COMPARISON:  01/16/2014  FINDINGS: Mild cardiomegaly is stable. No evidence of pulmonary edema. New opacity is seen in the left lower lobe abutting the left hemidiaphragm, which could be due to atelectasis or infiltrate. Tiny left pleural effusion versus pleural thickening noted. The transvenous pacemaker remains in appropriate position. Previous thoracic vertebroplasties again noted.  IMPRESSION: New left lower lobe atelectasis versus infiltrate. Tiny left pleural effusion versus pleural thickening.  Stable mild cardiomegaly.  No evidence of congestive heart failure.   Electronically Signed   By: Earle Gell M.D.   On: 01/18/2014 13:36   Dg Chest Portable 1 View  01/16/2014   CLINICAL DATA:  Chest discomfort which shortness of Breath  EXAM: PORTABLE CHEST - 1 VIEW  COMPARISON:  09/29/2013  FINDINGS: Cardiac shadow is mildly enlarged. A pacing device is again identified and stable. Multiple monitoring devices are seen. Changes of prior vertebral augmentation are again noted. The lungs are clear bilaterally.  IMPRESSION: Chronic changes without acute abnormality.   Electronically Signed   By: Inez Catalina M.D.   On: 01/16/2014 14:40    Discharge Medications     Medication List    TAKE these medications        amiodarone 200 MG tablet  Commonly known as:  PACERONE  Take 1 tablet (200 mg total) by mouth 2 (two) times daily.     apixaban 5 MG Tabs tablet  Commonly known as:  ELIQUIS  Take 1 tablet (5 mg total) by mouth 2 (two) times daily.     carvedilol 3.125 MG tablet  Commonly known as:  COREG  Take 1 tablet (3.125 mg total) by mouth 2 (two) times daily with a meal.     clonazePAM 0.5 MG tablet  Commonly known as:  KLONOPIN  Take 1 tablet (0.5 mg total) by mouth 2 (two) times daily as needed for anxiety.     fexofenadine 180 MG tablet    Commonly known as:  ALLEGRA  Take 180 mg by mouth daily as needed for allergies.     fluticasone 50 MCG/ACT nasal spray  Commonly known as:  FLONASE  Place 2 sprays into both nostrils daily as needed for allergies.     furosemide 40 MG tablet  Commonly known as:  LASIX  Take 1 tablet (40 mg total) by mouth daily.     levothyroxine 200 MCG tablet  Commonly known as:  SYNTHROID, LEVOTHROID  Take 200 mcg by mouth daily before breakfast.     nitroGLYCERIN 0.4 MG SL tablet  Commonly known as:  NITROSTAT  Place 0.4 mg under the tongue every 5 (five) minutes as needed for chest pain.     omega-3 acid ethyl esters 1 G capsule  Commonly known as:  LOVAZA  Take 1 g by mouth daily.     ondansetron 4 MG disintegrating tablet  Commonly known as:  ZOFRAN-ODT  Take 4 mg by mouth 2 (two) times daily as needed for nausea or vomiting.     pantoprazole 40 MG tablet  Commonly known as:  PROTONIX  Take 1 tablet (40 mg total) by mouth 2 (two) times daily.     polyethylene glycol packet  Commonly known as:  MIRALAX / GLYCOLAX  Take 17 g by mouth daily as needed (for constipation).     raloxifene 60 MG tablet  Commonly known as:  EVISTA  Take 60 mg by mouth daily.     spironolactone 25 MG tablet  Commonly known as:  ALDACTONE  Take 25 mg by mouth daily.        Disposition   The patient will be discharged in stable condition to home.  Follow-up Information    Follow up with Virl Axe, MD.   Specialty:  Cardiology   Why:  The office will call you to make an appoinment., If you do not hear from them, please contact them., You should be seen within 1 week   Contact information:   [address] [address] [phone]         Duration of Discharge Encounter: Greater than 30 minutes including physician and PA time.  Mable Fill R PA-C 01/19/2014, 4:40 PM   EP Attending  Patient seen and examined. Agree with above findings. [place] for  discharge. See my note as well.  Mikle Bosworth.D.

## 2014-01-19 NOTE — Plan of Care (Signed)
Problem: Discharge Progression Outcomes Goal: INR monitor plan established Outcome: Not Applicable Date Met:  42/87/68 Goal: Other Discharge Outcomes/Goals Outcome: Not Applicable Date Met:  11/57/26

## 2014-01-19 NOTE — Progress Notes (Signed)
Patient ID: [name], female   DOB: 1950/05/26, 63 y.o.   MRN: [id number]    SUBJECTIVE: The patient is doing well today.  She does have some increased shortness of breath.  No chest pain or palpitations.   I/O +1L; weight unchanged from yesterday.    Creat 1.28 -->> 1.47 , now 1.56   CURRENT MEDICATIONS: . amiodarone  200 mg Oral BID  . apixaban  5 mg Oral BID  . carvedilol  3.125 mg Oral BID WC  . furosemide  40 mg Oral Daily  . levothyroxine  200 mcg Oral QAC breakfast  . loratadine  10 mg Oral Daily  . omega-3 acid ethyl esters  1 g Oral Daily  . pantoprazole  40 mg Oral BID  . raloxifene  60 mg Oral Daily  . sodium chloride  3 mL Intravenous Q12H  . spironolactone  25 mg Oral Daily   . sodium chloride Stopped (01/16/14 1351)    OBJECTIVE: Physical Exam: Filed Vitals:   01/18/14 1556 01/18/14 2100 01/19/14 0500 01/19/14 0751  BP: 98/64 87/65 117/56 114/67  Pulse: 78 92 85 74  Temp:  97.1 F (36.2 C) 98 F (36.7 C)   TempSrc:      Resp:  18 18   Height:      Weight:   138 lb 12.8 oz (62.959 kg)   SpO2:  100% 100%     Intake/Output Summary (Last 24 hours) at 01/19/14 1106 Last data filed at 01/19/14 1001  Gross per 24 hour  Intake    483 ml  Output      0 ml  Net    483 ml    Telemetry reveals AV pacing, no further VT  GEN- The patient is well appearing, alert and oriented x 3 today.   Head- normocephalic, atraumatic Eyes-  Sclera clear, conjunctiva pink Ears- hearing intact Oropharynx- clear Neck- supple, no JVP Lymph- no cervical lymphadenopathy Lungs- Clear to ausculation bilaterally, normal work of breathing Heart- Regular rate and rhythm, no murmurs, rubs or gallops, PMI not laterally displaced GI- soft, NT, ND, + BS Extremities- no clubbing, cyanosis, or edema Skin- no rash or lesion Psych- euthymic mood, full affect Neuro- strength and sensation are intact  LABS: Basic Metabolic Panel:  Recent Labs  01/16/14 1325 01/17/14 0318  01/18/14 1000  NA 140 135* 130*  K 4.0 4.0 3.7  CL 98 96 94*  CO2 22 20 19   GLUCOSE 167* 106* 162*  BUN 29* 31* 32*  CREATININE 1.28* 1.47* 1.56*  CALCIUM 8.1* 7.7* 7.6*  MG 1.7  --   --    Liver Function Tests:  Recent Labs  01/17/14 0318  AST 77*  ALT 27  ALKPHOS 80  BILITOT 0.7  PROT 6.4  ALBUMIN 3.0*   CBC:  Recent Labs  01/16/14 1325  WBC 4.2  NEUTROABS 2.5  HGB 11.6*  HCT 34.5*  MCV 101.5*  PLT 134*   Cardiac Enzymes:  Recent Labs  01/16/14 1325  TROPONINI 0.30*   Thyroid Function Tests:  Recent Labs  01/16/14 2011  TSH 1.830    RADIOLOGY: Dg Chest Portable 1 View 01/16/2014   CLINICAL DATA:  Chest discomfort which shortness of Breath  EXAM: PORTABLE CHEST - 1 VIEW  COMPARISON:  09/29/2013  FINDINGS: Cardiac shadow is mildly enlarged. A pacing device is again identified and stable. Multiple monitoring devices are seen. Changes of prior vertebral augmentation are again noted. The lungs are clear bilaterally.  IMPRESSION: Chronic  changes without acute abnormality.   Electronically Signed   By: Inez Catalina M.D.   On: 01/16/2014 14:40    ASSESSMENT AND PLAN:  Principal Problem:   Sustained VT (ventricular tachycardia) Active Problems:   Chronic combined systolic and diastolic heart failure, NYHA class 3   Ischemic cardiomyopathy   CKD (chronic kidney disease)   CAD (coronary artery disease)   Hypothyroidism   Current use of long term anticoagulation   VT (ventricular tachycardia) worsening renal insufficiency, now stable 1.  VT  ICD reprogrammed 01-16-14 to detect slower ventricular tachycardia Change IV Amiodarone to po today.  Was on 200mg  daily at home, will increase to 400 daily in divided doses No driving X6 months  2.  Acute on chronic systolic heart failure Currently on Coreg, Furosemide, Spironolactone Hypotension has resolved.  Increased shortness of breath today with positive I/O and 2 pound weight gain since yesterday.   Creatinine increased from 1.28--> 1.47 to 1.56 All I suspect due to prolonged VT and prerenal azotemia.  3.  Atrial fibrillation Status post PVI  Anticoagulated with Eliquis  4. Disposition - ok for discharge home. I would like her to take her lasix bid for 3 days after discharge and then go back to lasix daily. She will need early followup with a transition of care appointment.  Mikle Bosworth.D.

## 2014-01-19 NOTE — Discharge Instructions (Signed)
Take Lasix 40mg  twice a day for THREE DAYS and then go back to 40mg  once a day. No driving for at least 6 months!

## 2014-01-20 ENCOUNTER — Telehealth: Payer: Self-pay | Admitting: Internal Medicine

## 2014-01-20 NOTE — Telephone Encounter (Signed)
ok 

## 2014-01-20 NOTE — Telephone Encounter (Signed)
[facility] called would like orders for PT/OT post [name].

## 2014-01-20 NOTE — Telephone Encounter (Signed)
Notified [facility] Kendrick Fries) with md response...Johny Chess

## 2014-01-21 ENCOUNTER — Telehealth: Payer: Self-pay | Admitting: Nurse Practitioner

## 2014-01-21 NOTE — Telephone Encounter (Signed)
New Message       Pt scheduled for TOC on 01/27/14 at 11:30 with Truitt Merle.

## 2014-01-21 NOTE — Telephone Encounter (Signed)
Spoke with patient and reviewed her medications, verbalized taking all medications as d/c home on Patient states she is feeling better but still weak Patient feels like she is still building up her strength Verified follow up appointment Advised to call back if any problems before follow up ov

## 2014-01-27 ENCOUNTER — Telehealth: Payer: Self-pay | Admitting: *Deleted

## 2014-01-27 ENCOUNTER — Encounter: Payer: Self-pay | Admitting: Nurse Practitioner

## 2014-01-27 ENCOUNTER — Ambulatory Visit (INDEPENDENT_AMBULATORY_CARE_PROVIDER_SITE_OTHER): Payer: Medicare Other | Admitting: *Deleted

## 2014-01-27 ENCOUNTER — Ambulatory Visit (INDEPENDENT_AMBULATORY_CARE_PROVIDER_SITE_OTHER): Payer: Medicare Other | Admitting: Nurse Practitioner

## 2014-01-27 VITALS — BP 110/60 | HR 69 | Ht 64.0 in | Wt 134.8 lb

## 2014-01-27 DIAGNOSIS — I495 Sick sinus syndrome: Secondary | ICD-10-CM

## 2014-01-27 DIAGNOSIS — I472 Ventricular tachycardia, unspecified: Secondary | ICD-10-CM

## 2014-01-27 DIAGNOSIS — I480 Paroxysmal atrial fibrillation: Secondary | ICD-10-CM

## 2014-01-27 DIAGNOSIS — I255 Ischemic cardiomyopathy: Secondary | ICD-10-CM

## 2014-01-27 DIAGNOSIS — Z79899 Other long term (current) drug therapy: Secondary | ICD-10-CM

## 2014-01-27 LAB — HEPATIC FUNCTION PANEL
ALT: 23 U/L (ref 0–35)
AST: 77 U/L — ABNORMAL HIGH (ref 0–37)
Albumin: 3.3 g/dL — ABNORMAL LOW (ref 3.5–5.2)
Alkaline Phosphatase: 98 U/L (ref 39–117)
Bilirubin, Direct: 0.5 mg/dL — ABNORMAL HIGH (ref 0.0–0.3)
Total Bilirubin: 1.5 mg/dL — ABNORMAL HIGH (ref 0.2–1.2)
Total Protein: 7.6 g/dL (ref 6.0–8.3)

## 2014-01-27 LAB — BASIC METABOLIC PANEL
BUN: 17 mg/dL (ref 6–23)
CO2: 27 mEq/L (ref 19–32)
Calcium: 9.1 mg/dL (ref 8.4–10.5)
Chloride: 100 mEq/L (ref 96–112)
Creatinine, Ser: 1.4 mg/dL — ABNORMAL HIGH (ref 0.4–1.2)
GFR: 47.47 mL/min — ABNORMAL LOW (ref >60.00)
Glucose, Bld: 132 mg/dL — ABNORMAL HIGH (ref 70–99)
Potassium: 4.7 mEq/L (ref 3.5–5.1)
Sodium: 138 mEq/L (ref 135–145)

## 2014-01-27 LAB — CBC
HCT: 38.7 % (ref 36.0–46.0)
Hemoglobin: 12.3 g/dL (ref 12.0–15.0)
MCHC: 31.9 g/dL (ref 30.0–36.0)
MCV: 104.9 fl — ABNORMAL HIGH (ref 78.0–100.0)
Platelets: 148 10*3/uL — ABNORMAL LOW (ref 150.0–400.0)
RBC: 3.69 Mil/uL — ABNORMAL LOW (ref 3.87–5.11)
RDW: 20.4 % — ABNORMAL HIGH (ref 11.5–15.5)
WBC: 4.5 10*3/uL (ref 4.0–10.5)

## 2014-01-27 LAB — MDC_IDC_ENUM_SESS_TYPE_INCLINIC
Lead Channel Impedance Value: 360 Ohm
Lead Channel Impedance Value: 480 Ohm
MDC IDC PG SERIAL: 7184288
MDC IDC STAT BRADY RA PERCENT PACED: 37 %
MDC IDC STAT BRADY RV PERCENT PACED: 93 %

## 2014-01-27 NOTE — Patient Instructions (Signed)
We will be checking the following labs today BMET, CBC, HPF  Stay on your current medicines  We will get your device checked  I would like to get you plugged back in to the CHF clinic with Dr. Haroldine Laws  See Dr. Caryl Comes as planned  Call the [place] office at [phone] if you have any questions, problems or concerns.

## 2014-01-27 NOTE — Progress Notes (Addendum)
[name] Date of Birth: April 03, 1950 Medical Record #[id number]  History of Present Illness: [name] is seen back today for a TOC/post hospital visit. Seen for Dr. Caryl Comes and the CHF clinic. She is a 63 year old female - former patient of Dr. Susa Simmonds - that I have previously seen. She has multiple medical issues which include ICM with ICD implant in 2005, generator change in 2006, DDD upgrade in 2014 with subsequent extraction due to infection in 11/2012, followed by CRT-D implant in May of 2015. She has known CAD with remote stents - last cath in 06/2013 with patent LAD and LCX stents). Negative Myoview from 04/2013 with no ischemia. She has had prior CVA, CKD, and atrial arrhythmias - on amiodarone and she has had prior AF ablation per Dr. Ola Spurr. Has has extensive DJD with multiple past surgeries.   Most recently presented to [facility] with VT - rate of 176 - below her detection rate - had emergent cardioversion in the ER due to unstable hemodynamics. ICD reprogrammed. Amiodarone was increased. She was instructed that she cannot drive for 6 months. She was also treated for acute on chronic systolic HF. She did have worsening renal function. Maintained only on Eliquis.   Comes back today. Here with her brother today. She says she is weak but slowly improving. Breathing getting better. No chest pain. Tells me she was on a higher dose of amiodarone at 400 mg BID for 3 days and now down to 200 mg BID. Seeing Dr. Ola Spurr later this week. Not dizzy but feels weak. Weight is stable. Admits that it has been quite some time since she has seen anyone in CHF clinic. Does not look like she sees Dr. Percival Spanish anymore.   Current Outpatient Prescriptions  Medication Sig Dispense Refill  . amiodarone (PACERONE) 200 MG tablet Take 1 tablet (200 mg total) by mouth 2 (two) times daily. 60 tablet 3  . apixaban (ELIQUIS) 5 MG TABS tablet Take 1 tablet (5 mg total) by mouth 2 (two) times daily. 60 tablet 3  .  carvedilol (COREG) 3.125 MG tablet Take 1 tablet (3.125 mg total) by mouth 2 (two) times daily with a meal. 60 tablet 6  . clonazePAM (KLONOPIN) 0.5 MG tablet Take 1 tablet (0.5 mg total) by mouth 2 (two) times daily as needed for anxiety. 60 tablet 5  . fexofenadine (ALLEGRA) 180 MG tablet Take 180 mg by mouth daily as needed for allergies.     . fluticasone (FLONASE) 50 MCG/ACT nasal spray Place 2 sprays into both nostrils daily as needed for allergies.     . furosemide (LASIX) 40 MG tablet Take 1 tablet (40 mg total) by mouth daily. 30 tablet 6  . levothyroxine (SYNTHROID, LEVOTHROID) 200 MCG tablet Take 200 mcg by mouth daily before breakfast.     . nitroGLYCERIN (NITROSTAT) 0.4 MG SL tablet Place 0.4 mg under the tongue every 5 (five) minutes as needed for chest pain.     [name] omega-3 acid ethyl esters (LOVAZA) 1 G capsule Take 1 g by mouth daily.    . ondansetron (ZOFRAN-ODT) 4 MG disintegrating tablet Take 4 mg by mouth 2 (two) times daily as needed for nausea or vomiting.    . pantoprazole (PROTONIX) 40 MG tablet Take 1 tablet (40 mg total) by mouth 2 (two) times daily. 180 tablet 1  . polyethylene glycol (MIRALAX / GLYCOLAX) packet Take 17 g by mouth daily as needed (for constipation).     . raloxifene (EVISTA)  60 MG tablet Take 60 mg by mouth daily.    [name] spironolactone (ALDACTONE) 25 MG tablet Take 25 mg by mouth daily.     No current facility-administered medications for this visit.    Allergies  Allergen Reactions  . Coconut Oil Anaphylaxis and Hives  . Lansoprazole Nausea Only  . Penicillins Swelling    Throat swells  . Statins Other (See Comments)    cramps  . Lisinopril     Cough- but still tolerates it  . Lactose Intolerance (Gi) Diarrhea and Other (See Comments)    Patient reports abdominal cramping and diarrhea with lactose products.     Past Medical History  Diagnosis Date  . Spinal stenosis   . Numbness of foot   . Diverticulitis     a. s/p partial colectomy 1/12  with reversal in July 2012.  . Colitis, ischemic   . DJD (degenerative joint disease)   . Ischemic cardiomyopathy     a. Echo 06/16/10: EF 25-30%, anteroseptal and apical hypokinesis, moderate AI, mild MR.   . LV (left ventricular) mural thrombus   . CKD (chronic kidney disease), stage III   . GERD (gastroesophageal reflux disease)   . Hypertension   . Hypothyroidism   . Hyperlipidemia   . Compression fracture     a. of L2  . 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 2/2 atrial fibrillation with RVR in the context of hypokalemia  . PAF (paroxysmal atrial fibrillation)     a. on Eliquis, digoxin, and amiodarone  . Anemia   . H/O hiatal hernia   . Sinus bradycardia   . Mitral regurgitation   . Chronic systolic CHF (congestive heart failure)   . CAD (coronary artery disease)     a.  Ant MI 8/03 with stenting to LAD;  b. staged PCI w/ DES to OM1 8/03;  c. s/p DES to LAD 7/04;  d.  multiple caths in past (chronically abnl ECG); e. cardiac cath 3/12: LAD stent patent, distal LAD 40-50%, small ostial D1 80%, ostial D2 60%, OM-1 stent patent (20-30%), proximal RCA 50%, prox- mRCA 40-50%; f. cath 2012 nonobs g. cath 06/2013 and 08/2013 for "STEMI" - mild nonobst CAD only.  [name] History of TIA (transient ischemic attack)   . 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 s/p ICD implantation in 2005. b. Gen change 2006. 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 c. s/p CRT-D implantation 07/2013.  [name] Paroxysmal atrial tachycardia   . Paroxysmal atrial flutter   . Stroke   . Osteoporosis   . Oral thrush     a. By EGD 09/2013.  [name] Antral gastritis     a. By EGD 09/2013 - minimal.  . Ventricular tachycardia     a. 01/16/14 s/p amiodarone loading and ICD reprogramming    Past Surgical History  Procedure Laterality Date  . Back surgery    . Cardiac defibrillator placement  07/2003; 10/2004;  2014  . Lumbar laminectomy/decompression microdiscectomy  10/2001    L5-S1/E-chart  . Knee arthroscopy Right   . Thyroidectomy  1970's  . Total knee arthroplasty Left 11/2003  . X-stop implantation  12/2004    L3-4; L4-5  . Fixation kyphoplasty thoracic spine  07/2007    T3, 4, 6 compression fractures  . Shoulder open rotator cuff repair Right 04/2008  . Lumbar laminectomy/decompression microdiscectomy  01/2010  . Colectomy  03/2010  sigmoid left; transverse  . Peripherally inserted central catheter insertion  03/2010    removed upon discharge  . Colostomy reversal  12/2010  . Tonsillectomy and adenoidectomy  1969  . Appendectomy  03/2010  . Glaucoma surgery Right   . Cardiac defibrillator removal  12/17/2012    removed for infection  . Icd lead removal Left 12/17/2012    Procedure: ICD LEAD REMOVAL;  Surgeon: Evans Lance, MD;  Location: [facility];  Service: Cardiovascular;  Laterality: Left;  . Coronary angioplasty with stent placement  10/2001; 09/2002; ?date    "I've got a total of 4" (04/10/2013)  . Bi-ventricular implantable cardioverter defibrillator  (crt-d)  08/05/2013    STJ Quadra Assura CRTD implanted by Dr Caryl Comes (right sided)  . Esophagogastroduodenoscopy N/A 10/02/2013    Procedure: ESOPHAGOGASTRODUODENOSCOPY (EGD);  Surgeon: Lear Ng, MD;  Location: [facility] ENDOSCOPY;  Service: Endoscopy;  Laterality: N/A;  . Ablation of dysrhythmic focus  11/20/2013    [facility]    History  Smoking status  . Former Smoker -- 0.10 packs/day for 15 years  . Types: Cigarettes  . Quit date: 04/21/2001  Smokeless tobacco  . Never Used    Comment: "smoked ~ 1 pack/month"    History  Alcohol Use  . 0.6 oz/week  . 1 Glasses of wine per week    Family History  Problem Relation Age of Onset  . Diabetes Mother   . Diabetes Brother   . Arthritis      family history  . Prostate cancer      Family History    Review of Systems: The review of systems is per the HPI.  All  other systems were reviewed and are negative.  Physical Exam: BP 110/60 mmHg  Pulse 69  Ht 5\' 4"  (1.626 m)  Wt 134 lb 12.8 oz (61.145 kg)  BMI 23.13 kg/m2 Patient is chronically ill but in no acute distress. She looks puny.  Skin is warm and dry. Color is normal.  HEENT is unremarkable. Normocephalic/atraumatic. PERRL. Sclera are nonicteric. Neck is supple. No masses. No JVD. Lungs are essentially clear. Cardiac exam shows a regular rate and rhythm. Abdomen is soft. Extremities are without edema. Gait and ROM are intact. No gross neurologic deficits noted.  Wt Readings from Last 3 Encounters:  01/27/14 134 lb 12.8 oz (61.145 kg)  01/19/14 138 lb 12.8 oz (62.959 kg)  12/06/13 133 lb (60.328 kg)    LABORATORY DATA/PROCEDURES: EKG looks paced - will get her device checked today as well.   Lab Results  Component Value Date   WBC 4.2 01/16/2014   HGB 11.6* 01/16/2014   HCT 34.5* 01/16/2014   PLT 134* 01/16/2014   GLUCOSE 162* 01/18/2014   CHOL 226* 04/22/2013   TRIG 68 04/22/2013   HDL 119 04/22/2013   LDLDIRECT 126.3 12/15/2011   LDLCALC 93 04/22/2013   ALT 27 01/17/2014   AST 77* 01/17/2014   NA 130* 01/18/2014   K 3.7 01/18/2014   CL 94* 01/18/2014   CREATININE 1.56* 01/18/2014   BUN 32* 01/18/2014   CO2 19 01/18/2014   TSH 1.830 01/16/2014   INR 1.09 09/29/2013   HGBA1C 5.7* 06/06/2012    BNP (last 3 results)  Recent Labs  04/22/13 1200 06/04/13 0957 06/18/13 1327  PROBNP 754.3* 2458.0* 1223.0*   Echo Study Conclusions from May 2015  - Left ventricle: The cavity size was mildly dilated. Wall thickness was normal. Systolic function was severely reduced. The estimated ejection fraction  was in the range of 20% to 25%. There is akinesis of the mid-apicalanteroseptal and inferior myocardium. Doppler parameters are consistent with abnormal left ventricular relaxation (grade 1 diastolic dysfunction). - Aortic valve: There was trivial regurgitation. -  Mitral valve: Calcified annulus. There was mild regurgitation. - Right ventricle: The cavity size was mildly dilated. Wall thickness was normal.  Assessment / Plan: 1. Sustained VT - s/p emergent cardioversion - on amiodarone- Dr. Olin Pia last note is concerning for elevated LFTs on amiodarone in the past. Seeing Dr. Ola Spurr later this week for follow up of her AF ablation. Recheck her device today. Recheck her labs. Reminded that she can't drive for 6 months.  2. Ischemic CM -  Seems fairly stable.  3. Chronic systolic HF -  EF of 20 to 25% - needs to get back in with CHF clinic. Ideally, would like to get her on a better CHF regimen.   4. CKD -  Recheck her labs today.   She has follow up at [facility] later this week, next month with Dr. Caryl Comes. Will recheck labs. No change in medicines. Try to arrange follow up in the HF clinic.   Patient is agreeable to this plan and will call if any problems develop in the interim.   Burtis Junes, RN, ANP-C [place] [address] [place], [place]  [address] [phone]  Addendum:  Device check showed "NO VT"

## 2014-01-27 NOTE — Progress Notes (Signed)
CRT-D interrogation in clinic. Battery longevity 5.9 years. Pt in AF 16% of time. +Eliquis. No VT/VF episodes since hospital visit. Plan per SK.

## 2014-01-27 NOTE — Telephone Encounter (Signed)
S/w pt is aware of appointment with CHF clinic on November 23rd at 11:40 am

## 2014-01-28 ENCOUNTER — Other Ambulatory Visit: Payer: Self-pay | Admitting: *Deleted

## 2014-01-28 DIAGNOSIS — R945 Abnormal results of liver function studies: Principal | ICD-10-CM

## 2014-01-28 DIAGNOSIS — R7989 Other specified abnormal findings of blood chemistry: Secondary | ICD-10-CM

## 2014-02-04 ENCOUNTER — Telehealth: Payer: Self-pay | Admitting: *Deleted

## 2014-02-04 ENCOUNTER — Other Ambulatory Visit: Payer: Medicare Other

## 2014-02-04 NOTE — Telephone Encounter (Addendum)
[name] from [facility] called office requesting verbal order for continued OT 2x weekly for 3 weeks. Please advise?

## 2014-02-05 ENCOUNTER — Encounter (HOSPITAL_COMMUNITY): Payer: Self-pay

## 2014-02-05 ENCOUNTER — Emergency Department (HOSPITAL_COMMUNITY): Payer: Medicare Other

## 2014-02-05 ENCOUNTER — Inpatient Hospital Stay (HOSPITAL_COMMUNITY)
Admission: EM | Admit: 2014-02-05 | Discharge: 2014-02-07 | DRG: 273 | Disposition: A | Discharge status: Home / Self Care | Payer: Medicare Other | Attending: Internal Medicine | Admitting: Internal Medicine

## 2014-02-05 ENCOUNTER — Encounter (HOSPITAL_COMMUNITY)
Admission: EM | Disposition: A | Discharge status: Home / Self Care | Payer: Self-pay | Source: Home / Self Care | Attending: Internal Medicine

## 2014-02-05 DIAGNOSIS — Z9889 Other specified postprocedural states: Secondary | ICD-10-CM

## 2014-02-05 DIAGNOSIS — Z9049 Acquired absence of other specified parts of digestive tract: Secondary | ICD-10-CM | POA: Diagnosis present

## 2014-02-05 DIAGNOSIS — Z96652 Presence of left artificial knee joint: Secondary | ICD-10-CM | POA: Diagnosis present

## 2014-02-05 DIAGNOSIS — Z8673 Personal history of transient ischemic attack (TIA), and cerebral infarction without residual deficits: Secondary | ICD-10-CM

## 2014-02-05 DIAGNOSIS — I4719 Other supraventricular tachycardia: Secondary | ICD-10-CM | POA: Diagnosis present

## 2014-02-05 DIAGNOSIS — H409 Unspecified glaucoma: Secondary | ICD-10-CM | POA: Diagnosis present

## 2014-02-05 DIAGNOSIS — T82198A Other mechanical complication of other cardiac electronic device, initial encounter: Secondary | ICD-10-CM | POA: Diagnosis present

## 2014-02-05 DIAGNOSIS — Z87891 Personal history of nicotine dependence: Secondary | ICD-10-CM

## 2014-02-05 DIAGNOSIS — I2510 Atherosclerotic heart disease of native coronary artery without angina pectoris: Secondary | ICD-10-CM | POA: Diagnosis present

## 2014-02-05 DIAGNOSIS — E785 Hyperlipidemia, unspecified: Secondary | ICD-10-CM | POA: Diagnosis present

## 2014-02-05 DIAGNOSIS — N183 Chronic kidney disease, stage 3 (moderate): Secondary | ICD-10-CM | POA: Diagnosis present

## 2014-02-05 DIAGNOSIS — K219 Gastro-esophageal reflux disease without esophagitis: Secondary | ICD-10-CM | POA: Diagnosis present

## 2014-02-05 DIAGNOSIS — Z0389 Encounter for observation for other suspected diseases and conditions ruled out: Secondary | ICD-10-CM | POA: Diagnosis not present

## 2014-02-05 DIAGNOSIS — M4800 Spinal stenosis, site unspecified: Secondary | ICD-10-CM | POA: Diagnosis present

## 2014-02-05 DIAGNOSIS — I442 Atrioventricular block, complete: Secondary | ICD-10-CM | POA: Diagnosis present

## 2014-02-05 DIAGNOSIS — Z880 Allergy status to penicillin: Secondary | ICD-10-CM

## 2014-02-05 DIAGNOSIS — I480 Paroxysmal atrial fibrillation: Secondary | ICD-10-CM | POA: Diagnosis present

## 2014-02-05 DIAGNOSIS — Z4502 Encounter for adjustment and management of automatic implantable cardiac defibrillator: Secondary | ICD-10-CM

## 2014-02-05 DIAGNOSIS — M810 Age-related osteoporosis without current pathological fracture: Secondary | ICD-10-CM | POA: Diagnosis present

## 2014-02-05 DIAGNOSIS — I5022 Chronic systolic (congestive) heart failure: Secondary | ICD-10-CM

## 2014-02-05 DIAGNOSIS — Z888 Allergy status to other drugs, medicaments and biological substances status: Secondary | ICD-10-CM

## 2014-02-05 DIAGNOSIS — I471 Supraventricular tachycardia: Secondary | ICD-10-CM | POA: Diagnosis present

## 2014-02-05 DIAGNOSIS — I252 Old myocardial infarction: Secondary | ICD-10-CM

## 2014-02-05 DIAGNOSIS — I959 Hypotension, unspecified: Secondary | ICD-10-CM | POA: Diagnosis present

## 2014-02-05 DIAGNOSIS — I472 Ventricular tachycardia: Secondary | ICD-10-CM

## 2014-02-05 DIAGNOSIS — M1990 Unspecified osteoarthritis, unspecified site: Secondary | ICD-10-CM | POA: Diagnosis present

## 2014-02-05 DIAGNOSIS — I5042 Chronic combined systolic (congestive) and diastolic (congestive) heart failure: Secondary | ICD-10-CM | POA: Diagnosis present

## 2014-02-05 DIAGNOSIS — I255 Ischemic cardiomyopathy: Secondary | ICD-10-CM | POA: Diagnosis present

## 2014-02-05 DIAGNOSIS — Z79899 Other long term (current) drug therapy: Secondary | ICD-10-CM

## 2014-02-05 DIAGNOSIS — Z95810 Presence of automatic (implantable) cardiac defibrillator: Secondary | ICD-10-CM | POA: Diagnosis present

## 2014-02-05 DIAGNOSIS — Z91048 Other nonmedicinal substance allergy status: Secondary | ICD-10-CM

## 2014-02-05 DIAGNOSIS — E039 Hypothyroidism, unspecified: Secondary | ICD-10-CM | POA: Diagnosis present

## 2014-02-05 DIAGNOSIS — I129 Hypertensive chronic kidney disease with stage 1 through stage 4 chronic kidney disease, or unspecified chronic kidney disease: Secondary | ICD-10-CM | POA: Diagnosis present

## 2014-02-05 DIAGNOSIS — I5023 Acute on chronic systolic (congestive) heart failure: Secondary | ICD-10-CM | POA: Diagnosis present

## 2014-02-05 HISTORY — PX: AV NODE ABLATION: SHX5458

## 2014-02-05 LAB — BASIC METABOLIC PANEL
Anion gap: 17 — ABNORMAL HIGH (ref 5–15)
BUN: 18 mg/dL (ref 6–23)
CHLORIDE: 101 meq/L (ref 96–112)
CO2: 23 mEq/L (ref 19–32)
Calcium: 8.6 mg/dL (ref 8.4–10.5)
Creatinine, Ser: 1.38 mg/dL — ABNORMAL HIGH (ref 0.50–1.10)
GFR calc Af Amer: 46 mL/min — ABNORMAL LOW (ref >90)
GFR, EST NON AFRICAN AMERICAN: 39 mL/min — AB (ref >90)
GLUCOSE: 127 mg/dL — AB (ref 70–99)
POTASSIUM: 3.7 meq/L (ref 3.7–5.3)
SODIUM: 141 meq/L (ref 137–147)

## 2014-02-05 LAB — PROTIME-INR
INR: 1.21 (ref 0.00–1.49)
Prothrombin Time: 15.4 seconds — ABNORMAL HIGH (ref 11.6–15.2)

## 2014-02-05 LAB — TROPONIN I: Troponin I: 0.65 ng/mL (ref <0.30)

## 2014-02-05 LAB — CBC
HCT: 35.9 % — ABNORMAL LOW (ref 36.0–46.0)
HEMOGLOBIN: 11.9 g/dL — AB (ref 12.0–15.0)
MCH: 33.1 pg (ref 26.0–34.0)
MCHC: 33.1 g/dL (ref 30.0–36.0)
MCV: 100 fL (ref 78.0–100.0)
Platelets: 170 10*3/uL (ref 150–400)
RBC: 3.59 MIL/uL — ABNORMAL LOW (ref 3.87–5.11)
RDW: 18.8 % — ABNORMAL HIGH (ref 11.5–15.5)
WBC: 4 10*3/uL (ref 4.0–10.5)

## 2014-02-05 LAB — I-STAT TROPONIN, ED: TROPONIN I, POC: 0 ng/mL (ref 0.00–0.08)

## 2014-02-05 LAB — PRO B NATRIURETIC PEPTIDE: PRO B NATRI PEPTIDE: 5151 pg/mL — AB (ref 0–125)

## 2014-02-05 LAB — TSH: TSH: 1.74 u[IU]/mL (ref 0.350–4.500)

## 2014-02-05 SURGERY — AV NODE ABLATION
Anesthesia: LOCAL

## 2014-02-05 MED ORDER — SPIRONOLACTONE 25 MG PO TABS
25.0000 mg | ORAL_TABLET | Freq: Every day | ORAL | Status: DC
  Administered 2014-02-06 – 2014-02-07 (×2): 25 mg via ORAL
  Filled 2014-02-05 (×2): qty 1

## 2014-02-05 MED ORDER — SODIUM CHLORIDE 0.9 % IJ SOLN: 3.0000 mL | INTRAMUSCULAR | Status: DC | PRN

## 2014-02-05 MED ORDER — FLUTICASONE PROPIONATE 50 MCG/ACT NA SUSP
2.0000 | Freq: Every day | NASAL | Status: DC | PRN
  Filled 2014-02-05: qty 16

## 2014-02-05 MED ORDER — LORATADINE 10 MG PO TABS
10.0000 mg | ORAL_TABLET | Freq: Every day | ORAL | Status: DC
  Administered 2014-02-06 – 2014-02-07 (×2): 10 mg via ORAL
  Filled 2014-02-05 (×2): qty 1

## 2014-02-05 MED ORDER — ACETAMINOPHEN 325 MG PO TABS: 650.0000 mg | ORAL_TABLET | ORAL | Status: DC | PRN

## 2014-02-05 MED ORDER — SODIUM CHLORIDE 0.9 % IV SOLN: 250.0000 mL | INTRAVENOUS | Status: DC | PRN

## 2014-02-05 MED ORDER — BUPIVACAINE HCL (PF) 0.25 % IJ SOLN
INTRAMUSCULAR | Status: AC
  Filled 2014-02-05: qty 30

## 2014-02-05 MED ORDER — NITROGLYCERIN 0.4 MG SL SUBL: 0.4000 mg | SUBLINGUAL_TABLET | SUBLINGUAL | Status: DC | PRN

## 2014-02-05 MED ORDER — RALOXIFENE HCL 60 MG PO TABS
60.0000 mg | ORAL_TABLET | Freq: Every day | ORAL | Status: DC
  Administered 2014-02-05 – 2014-02-07 (×3): 60 mg via ORAL
  Filled 2014-02-05 (×3): qty 1

## 2014-02-05 MED ORDER — AMIODARONE HCL 200 MG PO TABS
200.0000 mg | ORAL_TABLET | Freq: Two times a day (BID) | ORAL | Status: DC
  Administered 2014-02-05 – 2014-02-07 (×4): 200 mg via ORAL
  Filled 2014-02-05 (×6): qty 1

## 2014-02-05 MED ORDER — ONDANSETRON HCL 4 MG/2ML IJ SOLN: 4.0000 mg | Freq: Four times a day (QID) | INTRAMUSCULAR | Status: DC | PRN

## 2014-02-05 MED ORDER — ONDANSETRON 4 MG PO TBDP: 4.0000 mg | ORAL_TABLET | Freq: Two times a day (BID) | ORAL | Status: DC | PRN

## 2014-02-05 MED ORDER — HEPARIN (PORCINE) IN NACL 2-0.9 UNIT/ML-% IJ SOLN
INTRAMUSCULAR | Status: AC
  Filled 2014-02-05: qty 500

## 2014-02-05 MED ORDER — OMEGA-3-ACID ETHYL ESTERS 1 G PO CAPS
1.0000 g | ORAL_CAPSULE | Freq: Every day | ORAL | Status: DC
  Administered 2014-02-05 – 2014-02-06 (×2): 1 g via ORAL
  Filled 2014-02-05 (×4): qty 1

## 2014-02-05 MED ORDER — CLONAZEPAM 0.5 MG PO TABS: 0.5000 mg | ORAL_TABLET | Freq: Two times a day (BID) | ORAL | Status: DC | PRN

## 2014-02-05 MED ORDER — LEVOTHYROXINE SODIUM 200 MCG PO TABS
200.0000 ug | ORAL_TABLET | Freq: Every day | ORAL | Status: DC
  Administered 2014-02-06 – 2014-02-07 (×2): 200 ug via ORAL
  Filled 2014-02-05 (×4): qty 1

## 2014-02-05 MED ORDER — PANTOPRAZOLE SODIUM 40 MG PO TBEC
40.0000 mg | DELAYED_RELEASE_TABLET | Freq: Two times a day (BID) | ORAL | Status: DC
  Administered 2014-02-05 – 2014-02-07 (×4): 40 mg via ORAL
  Filled 2014-02-05 (×4): qty 1

## 2014-02-05 MED ORDER — SODIUM CHLORIDE 0.9 % IJ SOLN: 3.0000 mL | Freq: Two times a day (BID) | INTRAMUSCULAR | Status: DC

## 2014-02-05 MED ORDER — CARVEDILOL 3.125 MG PO TABS
3.1250 mg | ORAL_TABLET | Freq: Two times a day (BID) | ORAL | Status: DC
  Administered 2014-02-06 – 2014-02-07 (×3): 3.125 mg via ORAL
  Filled 2014-02-05 (×5): qty 1

## 2014-02-05 MED ORDER — ACETAMINOPHEN 325 MG PO TABS
650.0000 mg | ORAL_TABLET | ORAL | Status: DC | PRN
Start: 2014-02-05 — End: 2014-02-07

## 2014-02-05 MED ORDER — POLYETHYLENE GLYCOL 3350 17 G PO PACK
17.0000 g | PACK | Freq: Every day | ORAL | Status: DC | PRN
  Filled 2014-02-05: qty 1

## 2014-02-05 MED ORDER — MIDAZOLAM HCL 5 MG/5ML IJ SOLN
INTRAMUSCULAR | Status: AC
  Filled 2014-02-05: qty 5

## 2014-02-05 MED ORDER — FENTANYL CITRATE 0.05 MG/ML IJ SOLN
INTRAMUSCULAR | Status: AC
  Filled 2014-02-05: qty 2

## 2014-02-05 MED ORDER — OFF THE BEAT BOOK
Freq: Once | Status: AC
  Administered 2014-02-06
  Filled 2014-02-05: qty 1

## 2014-02-05 MED ORDER — FUROSEMIDE 40 MG PO TABS
40.0000 mg | ORAL_TABLET | Freq: Every day | ORAL | Status: DC
  Administered 2014-02-06: 09:00:00 40 mg via ORAL
  Filled 2014-02-05 (×2): qty 1

## 2014-02-05 MED ORDER — APIXABAN 5 MG PO TABS
5.0000 mg | ORAL_TABLET | Freq: Two times a day (BID) | ORAL | Status: DC
  Administered 2014-02-05 – 2014-02-07 (×4): 5 mg via ORAL
  Filled 2014-02-05 (×6): qty 1

## 2014-02-05 NOTE — ED Notes (Signed)
IV attempt x 1. Pt states she is a hard stick and has had to be stuck multiple times in the past and then by IV team.

## 2014-02-05 NOTE — ED Notes (Signed)
[employer] at bedside to interrogate pacemaker

## 2014-02-05 NOTE — ED Notes (Signed)
Per [facility], pt from home for defib firing x 1 while taking medication. Pt states it is only suppose to fire if her HR gets to 170. Pt is alert and oriented x 4. Per EMS, it started intermittently pacing in route. Given 324 mg ASA but no NTG. Pt denies any pain. States after the firing, she has been belching.

## 2014-02-05 NOTE — ED Notes (Signed)
Per [employer], transmission not received for pacemaker.  Second RN to interrogate device.

## 2014-02-05 NOTE — H&P (Signed)
History of Present Illness: [name] is seen back today for a TOC/post hospital visit. Seen for Dr. Caryl Comes and the CHF clinic. She is a 64 year old female - former patient of Dr. Susa Simmonds - that I have previously seen. She has multiple medical issues which include ICM with ICD implant in 2005, generator change in 2006, DDD upgrade in 2014 with subsequent extraction due to infection in 11/2012, followed by CRT-D implant in May of 2015. She has known CAD with remote stents - last cath in 06/2013 with patent LAD and LCX stents). Negative Myoview from 04/2013 with no ischemia. She has had prior CVA, CKD, and atrial arrhythmias - on amiodarone and she has had prior AF ablation per Dr. Ola Spurr. Has has extensive DJD with multiple past surgeries.   Most recently presented to [facility] with VT - rate of 176 - below her detection rate - had emergent cardioversion in the ER due to unstable hemodynamics. ICD reprogrammed. Amiodarone was increased. She was instructed that she cannot drive for 6 months. She was also treated for acute on chronic systolic HF. She did have worsening renal function. Maintained only on Eliquis.   Comes back today. Here with her brother today. She says she is weak but slowly improving. Breathing getting better. No chest pain. Tells me she was on a higher dose of amiodarone at 400 mg BID for 3 days and now down to 200 mg BID. Seeing Dr. Ola Spurr later this week. Not dizzy but feels weak. Weight is stable. Admits that it has been quite some time since she has seen anyone in CHF clinic. Does not look like she sees Dr. Percival Spanish anymore.   Current Outpatient Prescriptions  Medication Sig Dispense Refill  . amiodarone (PACERONE) 200 MG tablet Take 1 tablet (200 mg total) by mouth 2 (two) times daily. 60 tablet 3  . apixaban (ELIQUIS) 5 MG TABS tablet Take 1 tablet (5 mg total) by mouth 2 (two) times daily. 60 tablet 3  . carvedilol (COREG) 3.125 MG tablet Take 1 tablet (3.125 mg  total) by mouth 2 (two) times daily with a meal. 60 tablet 6  . clonazePAM (KLONOPIN) 0.5 MG tablet Take 1 tablet (0.5 mg total) by mouth 2 (two) times daily as needed for anxiety. 60 tablet 5  . fexofenadine (ALLEGRA) 180 MG tablet Take 180 mg by mouth daily as needed for allergies.     . fluticasone (FLONASE) 50 MCG/ACT nasal spray Place 2 sprays into both nostrils daily as needed for allergies.     . furosemide (LASIX) 40 MG tablet Take 1 tablet (40 mg total) by mouth daily. 30 tablet 6  . levothyroxine (SYNTHROID, LEVOTHROID) 200 MCG tablet Take 200 mcg by mouth daily before breakfast.     . nitroGLYCERIN (NITROSTAT) 0.4 MG SL tablet Place 0.4 mg under the tongue every 5 (five) minutes as needed for chest pain.     [name] omega-3 acid ethyl esters (LOVAZA) 1 G capsule Take 1 g by mouth daily.    . ondansetron (ZOFRAN-ODT) 4 MG disintegrating tablet Take 4 mg by mouth 2 (two) times daily as needed for nausea or vomiting.    . pantoprazole (PROTONIX) 40 MG tablet Take 1 tablet (40 mg total) by mouth 2 (two) times daily. 180 tablet 1  . polyethylene glycol (MIRALAX / GLYCOLAX) packet Take 17 g by mouth daily as needed (for constipation).     . raloxifene (EVISTA) 60 MG tablet Take 60 mg by mouth daily.    [name]  spironolactone (ALDACTONE) 25 MG tablet Take 25 mg by mouth daily.     No current facility-administered medications for this visit.    Allergies  Allergen Reactions  . Coconut Oil Anaphylaxis and Hives  . Lansoprazole Nausea Only  . Penicillins Swelling    Throat swells  . Statins Other (See Comments)    cramps  . Lisinopril     Cough- but still tolerates it  . Lactose Intolerance (Gi) Diarrhea and Other (See Comments)    Patient reports abdominal cramping and diarrhea with lactose products.     Past Medical History  Diagnosis Date  . Spinal stenosis   . Numbness of foot    . Diverticulitis     a. s/p partial colectomy 1/12 with reversal in July 2012.  . Colitis, ischemic   . DJD (degenerative joint disease)   . Ischemic cardiomyopathy     a. Echo 06/16/10: EF 25-30%, anteroseptal and apical hypokinesis, moderate AI, mild MR.   . LV (left ventricular) mural thrombus   . CKD (chronic kidney disease), stage III   . GERD (gastroesophageal reflux disease)   . Hypertension   . Hypothyroidism   . Hyperlipidemia   . Compression fracture     a. of L2  . Inappropriate shocks from ICD (implantable cardioverter-defibrillator)     a. 2/2 atrial fibrillation with RVR in the context of hypokalemia  . PAF (paroxysmal atrial fibrillation)     a. on Eliquis, digoxin, and amiodarone  . Anemia   . H/O hiatal hernia   . Sinus bradycardia   . Mitral regurgitation   . Chronic systolic CHF (congestive heart failure)   . CAD (coronary artery disease)     a. Ant MI 8/03 with stenting to LAD; b. staged PCI w/ DES to OM1 8/03; c. s/p DES to LAD 7/04; d. multiple caths in past (chronically abnl ECG); e. cardiac cath 3/12: LAD stent patent, distal LAD 40-50%, small ostial D1 80%, ostial D2 60%, OM-1 stent patent (20-30%), proximal RCA 50%, prox- mRCA 40-50%; f. cath 2012 nonobs g. cath 06/2013 and 08/2013 for "STEMI" - mild nonobst CAD only.  [name] History of TIA (transient ischemic attack)   . 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 s/p ICD implantation in 2005. b. Gen change 2006. 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 c. s/p CRT-D implantation 07/2013.  [name] Paroxysmal atrial tachycardia   . Paroxysmal atrial flutter   . Stroke   . Osteoporosis   . Oral thrush     a. By EGD 09/2013.  [name] Antral gastritis     a. By EGD 09/2013 - minimal.  . Ventricular tachycardia     a. 01/16/14 s/p  amiodarone loading and ICD reprogramming    Past Surgical History  Procedure Laterality Date  . Back surgery    . Cardiac defibrillator placement  07/2003; 10/2004; 2014  . Lumbar laminectomy/decompression microdiscectomy  10/2001    L5-S1/E-chart  . Knee arthroscopy Right   . Thyroidectomy  1970's  . Total knee arthroplasty Left 11/2003  . X-stop implantation  12/2004    L3-4; L4-5  . Fixation kyphoplasty thoracic spine  07/2007    T3, 4, 6 compression fractures  . Shoulder open rotator cuff repair Right 04/2008  . Lumbar laminectomy/decompression microdiscectomy  01/2010  . Colectomy  03/2010    sigmoid left; transverse  . Peripherally inserted central catheter insertion  03/2010    removed upon discharge  .  Colostomy reversal  12/2010  . Tonsillectomy and adenoidectomy  1969  . Appendectomy  03/2010  . Glaucoma surgery Right   . Cardiac defibrillator removal  12/17/2012    removed for infection  . Icd lead removal Left 12/17/2012    Procedure: ICD LEAD REMOVAL; Surgeon: Evans Lance, MD; Location: [facility]; Service: Cardiovascular; Laterality: Left;  . Coronary angioplasty with stent placement  10/2001; 09/2002; ?date    "I've got a total of 4" (04/10/2013)  . Bi-ventricular implantable cardioverter defibrillator (crt-d)  08/05/2013    STJ Quadra Assura CRTD implanted by Dr Caryl Comes (right sided)  . Esophagogastroduodenoscopy N/A 10/02/2013    Procedure: ESOPHAGOGASTRODUODENOSCOPY (EGD); Surgeon: Lear Ng, MD; Location: [facility] END[facility]COPY; Service: Endoscopy; Laterality: N/A;  . Ablation of dysrhythmic focus  11/20/2013    [facility]    History  Smoking status  . Former Smoker -- 0.10 packs/day for 15 years  . Types: Cigarettes  . Quit date: 04/21/2001  Smokeless tobacco  . Never Used    Comment: "smoked ~ 1 pack/month"     History  Alcohol Use  . 0.6 oz/week  . 1 Glasses of wine per week    Family History  Problem Relation Age of Onset  . Diabetes Mother   . Diabetes Brother   . Arthritis      family history  . Prostate cancer      Family History    Review of Systems: The review of systems is per the HPI. All other systems were reviewed and are negative.  Physical Exam: BP 110/60 mmHg  Pulse 69  Ht 5\' 4"  (1.626 m)  Wt 134 lb 12.8 oz (61.145 kg)  BMI 23.13 kg/m2 Patient is chronically ill but in no acute distress. She looks puny. Skin is warm and dry. Color is normal. HEENT is unremarkable. Normocephalic/atraumatic. PERRL. Sclera are nonicteric. Neck is supple. No masses. No JVD. Lungs are essentially clear. Cardiac exam shows a regular rate and rhythm. Abdomen is soft. Extremities are without edema. Gait and ROM are intact. No gross neurologic deficits noted.  Wt Readings from Last 3 Encounters:  01/27/14 134 lb 12.8 oz (61.145 kg)  01/19/14 138 lb 12.8 oz (62.959 kg)  12/06/13 133 lb (60.328 kg)    LABORATORY DATA/PROCEDURES: EKG looks paced - will get her device checked today as well.    Recent Labs    Lab Results  Component Value Date   WBC 4.2 01/16/2014   HGB 11.6* 01/16/2014   HCT 34.5* 01/16/2014   PLT 134* 01/16/2014   GLUCOSE 162* 01/18/2014   CHOL 226* 04/22/2013   TRIG 68 04/22/2013   HDL 119 04/22/2013   LDLDIRECT 126.3 12/15/2011   LDLCALC 93 04/22/2013   ALT 27 01/17/2014   AST 77* 01/17/2014   NA 130* 01/18/2014   K 3.7 01/18/2014   CL 94* 01/18/2014   CREATININE 1.56* 01/18/2014   BUN 32* 01/18/2014   CO2 19 01/18/2014   TSH 1.830 01/16/2014   INR 1.09 09/29/2013   HGBA1C 5.7* 06/06/2012      BNP (last 3 results)  Recent Labs (within last 365 days)     Recent Labs  04/22/13 1200 06/04/13 0957  06/18/13 1327  PROBNP 754.3* 2458.0* 1223.0*     Echo Study Conclusions from May 2015  - Left ventricle: The cavity size was mildly dilated. Wall thickness was normal. Systolic function was severely reduced. The estimated ejection fraction was in the range of 20% to 25%. There is akinesis  of the mid-apicalanteroseptal and inferior myocardium. Doppler parameters are consistent with abnormal left ventricular relaxation (grade 1 diastolic dysfunction). - Aortic valve: There was trivial regurgitation. - Mitral valve: Calcified annulus. There was mild regurgitation. - Right ventricle: The cavity size was mildly dilated. Wall thickness was normal.  Assessment / Plan: 1. Sustained VT - s/p emergent cardioversion - on amiodarone- Dr. Olin Pia last note is concerning for elevated LFTs on amiodarone in the past. Seeing Dr. Ola Spurr later this week for follow up of her AF ablation. Recheck her device today. Recheck her labs. Reminded that she can't drive for 6 months.  2. Ischemic CM - Seems fairly stable.  3. Chronic systolic HF - EF of 20 to 25% - needs to get back in with CHF clinic. Ideally, would like to get her on a better CHF regimen.   4. CKD - Recheck her labs today  EP attending  Patient seen and examined. Agree with the findings as noted above. Since then, the patient has developed 1:1 atrial tachycardia and was admitted earlier today with recurrent ICD shocks with an atrial and ventricular rate of 180/min. She denies medical non-compliance. I have discussed the treatment options with the patient and recommended proceeding with AV node ablation. She wishes to proceed.  Mikle [email or web address].D.

## 2014-02-05 NOTE — ED Notes (Signed)
Pt going to EP lab for ablation before going to the floor. Floor and pt informed of such.

## 2014-02-05 NOTE — ED Notes (Addendum)
Defib/Pacer interrogated with bedside [employer]. RN's made aware.

## 2014-02-05 NOTE — Telephone Encounter (Signed)
Left detailed vm on [facility] PT approving order.

## 2014-02-05 NOTE — ED Provider Notes (Addendum)
CSN: [id number]     Arrival date & time 02/05/14  2725 History   First MD Initiated Contact with Patient 02/05/14 1012     Chief Complaint  Patient presents with  . Pacemaker Problem    defib fired     (Consider location/radiation/quality/duration/timing/severity/associated sxs/prior Treatment) The history is provided by the patient.   63 year old female brought in by EMS for defibrillator firing today. Patient is concerned or defibrillators not working properly. Patient followed by Dr. Caryl Comes from cardiology. Patient had an ablation done a week ago. Patient had pacemaker defibrillators many times in the past this is her third. Patient currently asymptomatic. Patient states that 3 weeks ago she was in in the defibrillator was not firing when it should fire. Now she is concerns firing when it should not.  Past Medical History  Diagnosis Date  . Spinal stenosis   . Numbness of foot   . Diverticulitis     a. s/p partial colectomy 1/12 with reversal in July 2012.  . Colitis, ischemic   . DJD (degenerative joint disease)   . Ischemic cardiomyopathy     a. Echo 06/16/10: EF 25-30%, anteroseptal and apical hypokinesis, moderate AI, mild MR.   . LV (left ventricular) mural thrombus   . CKD (chronic kidney disease), stage III   . GERD (gastroesophageal reflux disease)   . Hypertension   . Hypothyroidism   . Hyperlipidemia   . Compression fracture     a. of L2  . 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 2/2 atrial fibrillation with RVR in the context of hypokalemia  . PAF (paroxysmal atrial fibrillation)     a. on Eliquis, digoxin, and amiodarone  . Anemia   . H/O hiatal hernia   . Sinus bradycardia   . Mitral regurgitation   . Chronic systolic CHF (congestive heart failure)   . CAD (coronary artery disease)     a.  Ant MI 8/03 with stenting to LAD;  b. staged PCI w/ DES to OM1 8/03;  c. s/p DES to LAD 7/04;  d.  multiple caths in past (chronically  abnl ECG); e. cardiac cath 3/12: LAD stent patent, distal LAD 40-50%, small ostial D1 80%, ostial D2 60%, OM-1 stent patent (20-30%), proximal RCA 50%, prox- mRCA 40-50%; f. cath 2012 nonobs g. cath 06/2013 and 08/2013 for "STEMI" - mild nonobst CAD only.  [name] History of TIA (transient ischemic attack)   . 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 s/p ICD implantation in 2005. b. Gen change 2006. 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 c. s/p CRT-D implantation 07/2013.  [name] Paroxysmal atrial tachycardia   . Paroxysmal atrial flutter   . Stroke   . Osteoporosis   . Oral thrush     a. By EGD 09/2013.  [name] Antral gastritis     a. By EGD 09/2013 - minimal.  . Ventricular tachycardia     a. 01/16/14 s/p amiodarone loading and ICD reprogramming   Past Surgical History  Procedure Laterality Date  . Back surgery    . Cardiac defibrillator placement  07/2003; 10/2004; 2014  . Lumbar laminectomy/decompression microdiscectomy  10/2001    L5-S1/E-chart  . Knee arthroscopy Right   . Thyroidectomy  1970's  . Total knee arthroplasty Left 11/2003  . X-stop implantation  12/2004    L3-4; L4-5  . Fixation kyphoplasty thoracic spine  07/2007    T3, 4, 6 compression  fractures  . Shoulder open rotator cuff repair Right 04/2008  . Lumbar laminectomy/decompression microdiscectomy  01/2010  . Colectomy  03/2010    sigmoid left; transverse  . Peripherally inserted central catheter insertion  03/2010    removed upon discharge  . Colostomy reversal  12/2010  . Tonsillectomy and adenoidectomy  1969  . Appendectomy  03/2010  . Glaucoma surgery Right   . Cardiac defibrillator removal  12/17/2012    removed for infection  . Icd lead removal Left 12/17/2012    Procedure: ICD LEAD REMOVAL;  Surgeon: Evans Lance, MD;  Location: [facility];  Service: Cardiovascular;  Laterality: Left;  . Coronary angioplasty with stent placement  10/2001; 09/2002; ?date     "I've got a total of 4" (04/10/2013)  . Bi-ventricular implantable cardioverter defibrillator  (crt-d)  08/05/2013    STJ Quadra Assura CRTD implanted by Dr Caryl Comes (right sided)  . Esophagogastroduodenoscopy N/A 10/02/2013    Procedure: ESOPHAGOGASTRODUODENOSCOPY (EGD);  Surgeon: Lear Ng, MD;  Location: [facility] ENDOSCOPY;  Service: Endoscopy;  Laterality: N/A;  . Ablation of dysrhythmic focus  11/20/2013    [facility]   Family History  Problem Relation Age of Onset  . Diabetes Mother   . Diabetes Brother   . Arthritis      family history  . Prostate cancer      Family History   History  Substance Use Topics  . Smoking status: Former Smoker -- 0.10 packs/day for 15 years    Types: Cigarettes    Quit date: 04/21/2001  . Smokeless tobacco: Never Used     Comment: "smoked ~ 1 pack/month"  . Alcohol Use: 0.6 oz/week    1 Glasses of wine per week   OB History    No data available     Review of Systems  Constitutional: Negative for fever.  HENT: Negative for congestion.   Eyes: Negative for visual disturbance.  Respiratory: Negative for shortness of breath.   Cardiovascular: Positive for chest pain.  Gastrointestinal: Negative for nausea, vomiting and abdominal pain.  Genitourinary: Negative for dysuria.  Musculoskeletal: Negative for back pain.  Neurological: Negative for dizziness, syncope and light-headedness.  Hematological: Does not bruise/bleed easily.  Psychiatric/Behavioral: Negative for confusion.      Allergies  Coconut oil; Lansoprazole; Penicillins; Statins; Lisinopril; and Lactose intolerance (gi)  Home Medications   Prior to Admission medications   Medication Sig Start Date End Date Taking? Authorizing Provider  amiodarone (PACERONE) 200 MG tablet Take 1 tablet (200 mg total) by mouth 2 (two) times daily. 01/19/14  Yes Eileen Stanford, PA-C  apixaban (ELIQUIS) 5 MG TABS tablet Take 1 tablet (5 mg total) by mouth 2 (two) times daily. 09/02/13   Yes Jolaine Artist, MD  carvedilol (COREG) 3.125 MG tablet Take 1 tablet (3.125 mg total) by mouth 2 (two) times daily with a meal. 10/03/13  Yes Dayna N Dunn, PA-C  clonazePAM (KLONOPIN) 0.5 MG tablet Take 1 tablet (0.5 mg total) by mouth 2 (two) times daily as needed for anxiety. 09/16/13  Yes Janith Lima, MD  fexofenadine (ALLEGRA) 180 MG tablet Take 180 mg by mouth daily as needed for allergies.    Yes Historical Provider, MD  fluticasone (FLONASE) 50 MCG/ACT nasal spray Place 2 sprays into both nostrils daily as needed for allergies.    Yes Historical Provider, MD  furosemide (LASIX) 40 MG tablet Take 1 tablet (40 mg total) by mouth daily. 01/19/14  Yes Eileen Stanford, PA-C  levothyroxine (SYNTHROID, LEVOTHROID) 200 MCG tablet Take 200 mcg by mouth daily before breakfast.    Yes Historical Provider, MD  nitroGLYCERIN (NITROSTAT) 0.4 MG SL tablet Place 0.4 mg under the tongue every 5 (five) minutes as needed for chest pain.    Yes Historical Provider, MD  omega-3 acid ethyl esters (LOVAZA) 1 G capsule Take 1 g by mouth daily.   Yes Historical Provider, MD  ondansetron (ZOFRAN-ODT) 4 MG disintegrating tablet Take 4 mg by mouth 2 (two) times daily as needed for nausea or vomiting.   Yes Historical Provider, MD  pantoprazole (PROTONIX) 40 MG tablet Take 1 tablet (40 mg total) by mouth 2 (two) times daily. 07/12/13  Yes Janith Lima, MD  polyethylene glycol [facility] / GLYCOLAX) packet Take 17 g by mouth daily as needed (for constipation).    Yes Historical Provider, MD  raloxifene (EVISTA) 60 MG tablet Take 60 mg by mouth daily.   Yes Historical Provider, MD  spironolactone (ALDACTONE) 25 MG tablet Take 25 mg by mouth daily. 08/07/13  Yes Dayna N Dunn, PA-C   BP 97/55 mmHg  Pulse 72  Temp(Src) 98.1 F (36.7 C) (Oral)  Resp 15  Ht 5\' 4"  (1.626 m)  Wt 130 lb (58.968 kg)  BMI 22.30 kg/m2  SpO2 100% Physical Exam  Constitutional: She is oriented to person, place, and time. She appears  well-developed and well-nourished. No distress.  HENT:  Head: Normocephalic and atraumatic.  Mouth/Throat: Oropharynx is clear and moist.  Eyes: Conjunctivae and EOM are normal. Pupils are equal, round, and reactive to light.  Cardiovascular: Normal rate and normal heart sounds.   Irregular at times. Paced rhythm  Pulmonary/Chest: Effort normal and breath sounds normal. No respiratory distress.  Abdominal: Soft. Bowel sounds are normal. There is no tenderness.  Musculoskeletal: Normal range of motion. She exhibits no tenderness.  Neurological: She is alert and oriented to person, place, and time. No cranial nerve deficit. She exhibits normal muscle tone. Coordination normal.  Skin: Skin is warm. No rash noted.  Nursing note and vitals reviewed.   ED Course  Procedures (including critical care time) Labs Review Labs Reviewed  CBC - Abnormal; Notable for the following:    RBC 3.59 (*)    Hemoglobin 11.9 (*)    HCT 35.9 (*)    RDW 18.8 (*)    All other components within normal limits  BASIC METABOLIC PANEL - Abnormal; Notable for the following:    Glucose, Bld 127 (*)    Creatinine, Ser 1.38 (*)    GFR calc non Af Amer 39 (*)    GFR calc Af Amer 46 (*)    Anion gap 17 (*)    All other components within normal limits  I-STAT TROPOININ, ED   Results for orders placed or performed during the hospital encounter of 02/05/14  CBC  Result Value Ref Range   WBC 4.0 4.0 - 10.5 K/uL   RBC 3.59 (L) 3.87 - 5.11 MIL/uL   Hemoglobin 11.9 (L) 12.0 - 15.0 g/dL   HCT 35.9 (L) 36.0 - 46.0 %   MCV 100.0 78.0 - 100.0 fL   MCH 33.1 26.0 - 34.0 pg   MCHC 33.1 30.0 - 36.0 g/dL   RDW 18.8 (H) 11.5 - 15.5 %   Platelets 170 150 - 400 K/uL  Basic metabolic panel  Result Value Ref Range   Sodium 141 137 - 147 mEq/L   Potassium 3.7 3.7 - 5.3 mEq/L   Chloride 101 96 -  112 mEq/L   CO2 23 19 - 32 mEq/L   Glucose, Bld 127 (H) 70 - 99 mg/dL   BUN 18 6 - 23 mg/dL   Creatinine, Ser 1.38 (H) 0.50 -  1.10 mg/dL   Calcium 8.6 8.4 - 10.5 mg/dL   GFR calc non Af Amer 39 (L) >90 mL/min   GFR calc Af Amer 46 (L) >90 mL/min   Anion gap 17 (H) 5 - 15  I-stat troponin, ED (not at [facility])  Result Value Ref Range   Troponin i, poc 0.00 0.00 - 0.08 ng/mL   Comment 3             Imaging Review Dg Chest Port 1 View  02/05/2014   CLINICAL DATA:  63 year old female status post discharge of her defibrillator. Acute shortness of Breath. Initial encounter.  EXAM: PORTABLE CHEST - 1 VIEW  COMPARISON:  01/18/2014 and earlier.  FINDINGS: Portable AP upright view at 1116 hr. Right chest 3 lead cardiac AICD is stable. Stable cardiomegaly and mediastinal contours. No pneumothorax or pulmonary edema. Chronic streaky left lung base opacity, No definite pleural effusion. Postoperative and post vertebroplasty/ kyphoplasty changes in the spine. Streaky left lung base opacity re- identified and mildly regressed.  IMPRESSION: 1. Continued streaky left lung base opacity since October, could reflect atelectasis or unresolved infection. 2.  No new cardiopulmonary abnormality identified.   Electronically Signed   By: Lars Pinks M.D.   On: 02/05/2014 11:31     EKG Interpretation   Date/Time:  Wednesday February 05 2014 10:00:45 EST Ventricular Rate:  79 PR Interval:  176 QRS Duration: 87 QT Interval:  423 QTC Calculation: 485 R Axis:   -83 Text Interpretation:  Sinus rhythm Left ventricular hypertrophy Inferior  infarct, old Anterior infarct, old nonpaced Confirmed by Shontez Sermon  MD,  [place] [phone]) on 02/05/2014 10:32:51 AM      MDM   Final diagnoses:  Defibrillator discharge    Patient came in for defibrillator firing. After interrogation by the [place] people. It is obvious that the pacemaker is firing inappropriately. Patient was shocked for atrial tachycardia. Reprogramming of the pacemaker is recommended by them. Cardiology contacted. They will have Dr. Lovena Le look into the problem. Patient has been  asymptomatic here. Patient had an ablation done about a week ago and they think that the bigger was reprogrammed at that time. Patient earlier was having trouble with the defibrillator not firing a when it should have. Cardiology to sort this out.    Fredia Sorrow, MD 02/05/14 1510  Addendum disposition as per cardiology. Dr. Debara Pickett will be seen the patient.  Fredia Sorrow, MD 02/05/14 1606

## 2014-02-05 NOTE — Telephone Encounter (Signed)
yes

## 2014-02-05 NOTE — ED Notes (Signed)
Phlebotomy at the bedside  

## 2014-02-05 NOTE — CV Procedure (Signed)
EP Procedure Note  Procedure: AV node ablation  Indication: recurrent ICD Shocks for 1:1 atrial tachycardia, s/p BiV ICD  Preoperative diagnosis: same as above  Postoperative diagnosis: Same as above  Description of the procedure: After informed consent was obtained, the patient was taken to the diagnostic EP lab in a fasting state. After the usual preparation draping, intravenous Versed and fentanyl were used for sedation. A 7 French quadripolar ablation catheter was inserted percutaneously into the right femoral vein and advanced under fluoroscopic guidance into the right ventricle, near the His bundle region. The HV interval was 60 ms. 5  RF energy applications were delivered to the AV node, resulting in complete heart block. The patient was observed 15 minutes. She had no recurrent AV node conduction. Her ICD was reprogrammed and she was returned to the recovery area in satisfactory condition.  Complications: None immediately  Conclusion: successful AV node ablation without immediate complication  Mikle Bosworth.D.

## 2014-02-05 NOTE — H&P (Signed)
Patient ID: [name] MRN: [id number], DOB/AGE: Jul 29, 1950   Admit date: 02/05/2014   Primary Physician: [name], MD Primary Cardiologist: Dr. Caryl Comes  Pt. Profile:  JAKERA BEAUPRE is a 63 y.o. female with a history of ICM (s/p ICD implantation in 2005; gen change 2006; dual chamber upgrade 2014 with subsequent extraction due to infection 11/2012, CRT-D implant 08/4330), chronic systolic heart failure, CAD last intervention 2004, last cath 06/2013 (patent the LAD and circumflex stents), myoview 04/2013 with no ischemia), prior CVA, chronic kidney disease, atrial arrhythmias and recent admission for sustained ventricular tachycardia who presented to [facility] today after an ICD shock.  She originally underwent ICD implantation in 2005, gen change in 2006, upgrade to dual chamber ICD in 2014 with subsequent infection and extraction. Now s/p CRTD implant May 2015. She has a history of previous inappropriate shocks for atrial arrhythmias. She underwent afib ablation by Dr Ola Spurr recently.Most recently presented to [facility] with VT - rate of 176 - below her detection rate - had emergent cardioversion in the ER due to unstable hemodynamics. ICD reprogrammed. Amiodarone was increased. She was instructed that she cannot drive for 6 months. She was also treated for acute on chronic systolic HF. She did have worsening renal function. Maintained only on Eliquis. She was on a higher dose of amiodarone at 400 mg BID for 3 days and now down to 200 mg BID.   She saw Dr. Ola Spurr last week who apparently reprogrammed her device. She presented to the ED today due to being shocked by her ICD. Otherwise, she is feeling well and has no complaints. [place] says that the device was reprogrammed incorrectly and so she was inappropriately shocked for an atrial tachycardia.     Problem List  Past Medical History  Diagnosis Date  . Spinal stenosis   . Numbness of foot   . Diverticulitis     a. s/p  partial colectomy 1/12 with reversal in July 2012.  . Colitis, ischemic   . DJD (degenerative joint disease)   . Ischemic cardiomyopathy     a. Echo 06/16/10: EF 25-30%, anteroseptal and apical hypokinesis, moderate AI, mild MR.   . LV (left ventricular) mural thrombus   . CKD (chronic kidney disease), stage III   . GERD (gastroesophageal reflux disease)   . Hypertension   . Hypothyroidism   . Hyperlipidemia   . Compression fracture     a. of L2  . 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 2/2 atrial fibrillation with RVR in the context of hypokalemia  . PAF (paroxysmal atrial fibrillation)     a. on Eliquis, digoxin, and amiodarone  . Anemia   . H/O hiatal hernia   . Sinus bradycardia   . Mitral regurgitation   . Chronic systolic CHF (congestive heart failure)   . CAD (coronary artery disease)     a.  Ant MI 8/03 with stenting to LAD;  b. staged PCI w/ DES to OM1 8/03;  c. s/p DES to LAD 7/04;  d.  multiple caths in past (chronically abnl ECG); e. cardiac cath 3/12: LAD stent patent, distal LAD 40-50%, small ostial D1 80%, ostial D2 60%, OM-1 stent patent (20-30%), proximal RCA 50%, prox- mRCA 40-50%; f. cath 2012 nonobs g. cath 06/2013 and 08/2013 for "STEMI" - mild nonobst CAD only.  [name] History of TIA (transient ischemic attack)   . 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  s/p ICD implantation in 2005. b. Gen change 2006. 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 c. s/p CRT-D implantation 07/2013.  [name] Paroxysmal atrial tachycardia   . Paroxysmal atrial flutter   . Stroke   . Osteoporosis   . Oral thrush     a. By EGD 09/2013.  [name] Antral gastritis     a. By EGD 09/2013 - minimal.  . Ventricular tachycardia     a. 01/16/14 s/p amiodarone loading and ICD reprogramming    Past Surgical History  Procedure Laterality Date  . Back surgery    . Cardiac defibrillator  placement  07/2003; 10/2004; 2014  . Lumbar laminectomy/decompression microdiscectomy  10/2001    L5-S1/E-chart  . Knee arthroscopy Right   . Thyroidectomy  1970's  . Total knee arthroplasty Left 11/2003  . X-stop implantation  12/2004    L3-4; L4-5  . Fixation kyphoplasty thoracic spine  07/2007    T3, 4, 6 compression fractures  . Shoulder open rotator cuff repair Right 04/2008  . Lumbar laminectomy/decompression microdiscectomy  01/2010  . Colectomy  03/2010    sigmoid left; transverse  . Peripherally inserted central catheter insertion  03/2010    removed upon discharge  . Colostomy reversal  12/2010  . Tonsillectomy and adenoidectomy  1969  . Appendectomy  03/2010  . Glaucoma surgery Right   . Cardiac defibrillator removal  12/17/2012    removed for infection  . Icd lead removal Left 12/17/2012    Procedure: ICD LEAD REMOVAL;  Surgeon: Evans Lance, MD;  Location: [place];  Service: Cardiovascular;  Laterality: Left;  . Coronary angioplasty with stent placement  10/2001; 09/2002; ?date    "I've got a total of 4" (04/10/2013)  . Bi-ventricular implantable cardioverter defibrillator  (crt-d)  08/05/2013    STJ Quadra Assura CRTD implanted by Dr Caryl Comes (right sided)  . Esophagogastroduodenoscopy N/A 10/02/2013    Procedure: ESOPHAGOGASTRODUODENOSCOPY (EGD);  Surgeon: Lear Ng, MD;  Location: [facility] ENDOSCOPY;  Service: Endoscopy;  Laterality: N/A;  . Ablation of dysrhythmic focus  11/20/2013    [facility]     Allergies  Allergies  Allergen Reactions  . Coconut Oil Anaphylaxis and Hives  . Lansoprazole Nausea Only  . Penicillins Swelling    Throat swells  . Statins Other (See Comments)    cramps  . Lisinopril     Cough- but still tolerates it  . Lactose Intolerance (Gi) Diarrhea and Other (See Comments)    Patient reports abdominal cramping and diarrhea with lactose products.      Home Medications  Prior to Admission medications   Medication Sig Start Date End Date  Taking? Authorizing Provider  amiodarone (PACERONE) 200 MG tablet Take 1 tablet (200 mg total) by mouth 2 (two) times daily. 01/19/14  Yes Eileen Stanford, PA-C  apixaban (ELIQUIS) 5 MG TABS tablet Take 1 tablet (5 mg total) by mouth 2 (two) times daily. 09/02/13  Yes Jolaine Artist, MD  carvedilol (COREG) 3.125 MG tablet Take 1 tablet (3.125 mg total) by mouth 2 (two) times daily with a meal. 10/03/13  Yes Dayna N Dunn, PA-C  clonazePAM (KLONOPIN) 0.5 MG tablet Take 1 tablet (0.5 mg total) by mouth 2 (two) times daily as needed for anxiety. 09/16/13  Yes Janith Lima, MD  fexofenadine (ALLEGRA) 180 MG tablet Take 180 mg by mouth daily as needed for allergies.    Yes Historical Provider, MD  fluticasone (FLONASE) 50 MCG/ACT nasal  spray Place 2 sprays into both nostrils daily as needed for allergies.    Yes Historical Provider, MD  furosemide (LASIX) 40 MG tablet Take 1 tablet (40 mg total) by mouth daily. 01/19/14  Yes Eileen Stanford, PA-C  levothyroxine (SYNTHROID, LEVOTHROID) 200 MCG tablet Take 200 mcg by mouth daily before breakfast.    Yes Historical Provider, MD  nitroGLYCERIN (NITROSTAT) 0.4 MG SL tablet Place 0.4 mg under the tongue every 5 (five) minutes as needed for chest pain.    Yes Historical Provider, MD  omega-3 acid ethyl esters (LOVAZA) 1 G capsule Take 1 g by mouth daily.   Yes Historical Provider, MD  ondansetron (ZOFRAN-ODT) 4 MG disintegrating tablet Take 4 mg by mouth 2 (two) times daily as needed for nausea or vomiting.   Yes Historical Provider, MD  pantoprazole (PROTONIX) 40 MG tablet Take 1 tablet (40 mg total) by mouth 2 (two) times daily. 07/12/13  Yes Janith Lima, MD  polyethylene glycol [facility] / GLYCOLAX) packet Take 17 g by mouth daily as needed (for constipation).    Yes Historical Provider, MD  raloxifene (EVISTA) 60 MG tablet Take 60 mg by mouth daily.   Yes Historical Provider, MD  spironolactone (ALDACTONE) 25 MG tablet Take 25 mg by mouth daily.  08/07/13  Yes Dayna N Dunn, PA-C    Family History  Family History  Problem Relation Age of Onset  . Diabetes Mother   . Diabetes Brother   . Arthritis      family history  . Prostate cancer      Family History   Family Status  Relation Status Death Age  . Mother Deceased   . Father Deceased   . Brother Alive      Social History  History   Social History  . Marital Status: Widowed    Spouse Name: N/A    Number of Children: N/A  . Years of Education: N/A   Occupational History  . Retired    Social History Main Topics  . Smoking status: Former Smoker -- 0.10 packs/day for 15 years    Types: Cigarettes    Quit date: 04/21/2001  . Smokeless tobacco: Never Used     Comment: "smoked ~ 1 pack/month"  . Alcohol Use: 0.6 oz/week    1 Glasses of wine per week  . Drug Use: No  . Sexual Activity: Not Currently   Other Topics Concern  . Not on file   Social History Narrative   Lives alone, has a house, has some household help. Handicapped upfitted.     All other systems reviewed and are otherwise negative except as noted above.  Physical Exam  Blood pressure 97/55, pulse 72, temperature 98.1 F (36.7 C), temperature source Oral, resp. rate 15, height 5\' 4"  (1.626 m), weight 130 lb (58.968 kg), SpO2 100 %.  General: Pleasant, NAD. Thin elderly Psych: Normal affect. Neuro: Alert and oriented X 3. Moves all extremities spontaneously. HEENT: Normal  Neck: Supple without bruits or JVD. Lungs:  Resp regular and unlabored, CTA. Heart: RRR no s3, s4, or murmurs. Abdomen: Soft, non-tender, non-distended, BS + x 4.  Extremities: No clubbing, cyanosis or edema. DP/PT/Radials 2+ and equal bilaterally.  Labs  No results for input(s): CKTOTAL, CKMB, TROPONINI in the last 72 hours. Lab Results  Component Value Date   WBC 4.0 02/05/2014   HGB 11.9* 02/05/2014   HCT 35.9* 02/05/2014   MCV 100.0 02/05/2014   PLT 170 02/05/2014    Recent  Labs Lab 02/05/14 1035  NA  141  K 3.7  CL 101  CO2 23  BUN 18  CREATININE 1.38*  CALCIUM 8.6  GLUCOSE 127*   Lab Results  Component Value Date   CHOL 226* 04/22/2013   HDL 119 04/22/2013   LDLCALC 93 04/22/2013   TRIG 68 04/22/2013   Lab Results  Component Value Date   DDIMER 0.59* 02/17/2011     Radiology/Studies  Dg Chest 2 View  01/18/2014   CLINICAL DATA:  Heart failure.  EXAM: CHEST  2 VIEW  COMPARISON:  01/16/2014  FINDINGS: Mild cardiomegaly is stable. No evidence of pulmonary edema. New opacity is seen in the left lower lobe abutting the left hemidiaphragm, which could be due to atelectasis or infiltrate. Tiny left pleural effusion versus pleural thickening noted. The transvenous pacemaker remains in appropriate position. Previous thoracic vertebroplasties again noted.  IMPRESSION: New left lower lobe atelectasis versus infiltrate. Tiny left pleural effusion versus pleural thickening.  Stable mild cardiomegaly.  No evidence of congestive heart failure.   Electronically Signed   By: Earle Gell M.D.   On: 01/18/2014 13:36   Dg Chest Port 1 View  02/05/2014   CLINICAL DATA:  63 year old female status post discharge of her defibrillator. Acute shortness of Breath. Initial encounter.  EXAM: PORTABLE CHEST - 1 VIEW  COMPARISON:  01/18/2014 and earlier.  FINDINGS: Portable AP upright view at 1116 hr. Right chest 3 lead cardiac AICD is stable. Stable cardiomegaly and mediastinal contours. No pneumothorax or pulmonary edema. Chronic streaky left lung base opacity, No definite pleural effusion. Postoperative and post vertebroplasty/ kyphoplasty changes in the spine. Streaky left lung base opacity re- identified and mildly regressed.  IMPRESSION: 1. Continued streaky left lung base opacity since October, could reflect atelectasis or unresolved infection. 2.  No new cardiopulmonary abnormality identified.   Electronically Signed   By: Lars Pinks M.D.   On: 02/05/2014 11:31   Dg Chest Portable 1 View  01/16/2014    CLINICAL DATA:  Chest discomfort which shortness of Breath  EXAM: PORTABLE CHEST - 1 VIEW  COMPARISON:  09/29/2013  FINDINGS: Cardiac shadow is mildly enlarged. A pacing device is again identified and stable. Multiple monitoring devices are seen. Changes of prior vertebral augmentation are again noted. The lungs are clear bilaterally.  IMPRESSION: Chronic changes without acute abnormality.   Electronically Signed   By: Inez Catalina M.D.   On: 01/16/2014 14:40    ECG  Sinus rhythm HR 79 Left ventricular hypertrophy  ASSESSMENT AND PLAN MARCIANA UPLINGER is a 63 y.o. female with a history of ICM (s/p ICD implantation in 2005; gen change 2006; dual chamber upgrade 2014 with subsequent extraction due to infection 11/2012, CRT-D implant 03/6965), chronic systolic heart failure, CAD last intervention 2004, last cath 06/2013 (patent the LAD and circumflex stents), myoview 04/2013 with no ischemia), prior CVA, chronic kidney disease, atrial arrhythmias and recent admission for sustained ventricular tachycardia who presented to [facility] today after an inappropriate ICD shock.  Inappropriate ICD shock- patient shocked inappropriately for atrial tachycardia after recent reprogramming by her primary EP Dr. Ola Spurr. -- Plan to keep NPO and proceed with atrial ablation procedure with Dr. Lovena Le this afternoon  Recent sustained SVT- continue amiodarone 200mg  BID  Ischemic CM/Chronic systolic HF -- Seems fairly stable. -- EF of 20-25% - has follow up in the CHF clinic on 02/10/14 -- Continue BB and spironolactone 25mg  No ACE/ARB due to CKD.   CKD -creat stable at 1.38.  Atrial fibrillation -- Anticoagulated with Eliquis  CAD -- No ischemic symptoms -- No ASA due to anticoagulation w/ Eliquis  Hypothyroidism- -- Continue synthroid  SignedEileen Stanford, PA-C 02/05/2014, 2:43 PM  Pager [phone]  EP Attending  Patient seen and examined. Agree with above.  Mikle Bosworth.D.

## 2014-02-05 NOTE — ED Notes (Signed)
Cardiologist at bedside.  

## 2014-02-06 DIAGNOSIS — Z95810 Presence of automatic (implantable) cardiac defibrillator: Secondary | ICD-10-CM | POA: Diagnosis not present

## 2014-02-06 DIAGNOSIS — I5023 Acute on chronic systolic (congestive) heart failure: Secondary | ICD-10-CM | POA: Diagnosis present

## 2014-02-06 DIAGNOSIS — E785 Hyperlipidemia, unspecified: Secondary | ICD-10-CM | POA: Diagnosis present

## 2014-02-06 DIAGNOSIS — Z79899 Other long term (current) drug therapy: Secondary | ICD-10-CM | POA: Diagnosis not present

## 2014-02-06 DIAGNOSIS — I472 Ventricular tachycardia: Secondary | ICD-10-CM | POA: Diagnosis present

## 2014-02-06 DIAGNOSIS — M4800 Spinal stenosis, site unspecified: Secondary | ICD-10-CM | POA: Diagnosis present

## 2014-02-06 DIAGNOSIS — I471 Supraventricular tachycardia: Secondary | ICD-10-CM | POA: Diagnosis present

## 2014-02-06 DIAGNOSIS — I252 Old myocardial infarction: Secondary | ICD-10-CM | POA: Diagnosis not present

## 2014-02-06 DIAGNOSIS — K219 Gastro-esophageal reflux disease without esophagitis: Secondary | ICD-10-CM | POA: Diagnosis present

## 2014-02-06 DIAGNOSIS — Z87891 Personal history of nicotine dependence: Secondary | ICD-10-CM | POA: Diagnosis not present

## 2014-02-06 DIAGNOSIS — I442 Atrioventricular block, complete: Secondary | ICD-10-CM | POA: Diagnosis present

## 2014-02-06 DIAGNOSIS — Z888 Allergy status to other drugs, medicaments and biological substances status: Secondary | ICD-10-CM | POA: Diagnosis not present

## 2014-02-06 DIAGNOSIS — I480 Paroxysmal atrial fibrillation: Secondary | ICD-10-CM | POA: Diagnosis present

## 2014-02-06 DIAGNOSIS — Z880 Allergy status to penicillin: Secondary | ICD-10-CM | POA: Diagnosis not present

## 2014-02-06 DIAGNOSIS — M1990 Unspecified osteoarthritis, unspecified site: Secondary | ICD-10-CM | POA: Diagnosis present

## 2014-02-06 DIAGNOSIS — I255 Ischemic cardiomyopathy: Secondary | ICD-10-CM | POA: Diagnosis present

## 2014-02-06 DIAGNOSIS — N183 Chronic kidney disease, stage 3 (moderate): Secondary | ICD-10-CM | POA: Diagnosis present

## 2014-02-06 DIAGNOSIS — H409 Unspecified glaucoma: Secondary | ICD-10-CM | POA: Diagnosis present

## 2014-02-06 DIAGNOSIS — M810 Age-related osteoporosis without current pathological fracture: Secondary | ICD-10-CM | POA: Diagnosis present

## 2014-02-06 DIAGNOSIS — Z0389 Encounter for observation for other suspected diseases and conditions ruled out: Secondary | ICD-10-CM | POA: Diagnosis present

## 2014-02-06 DIAGNOSIS — I129 Hypertensive chronic kidney disease with stage 1 through stage 4 chronic kidney disease, or unspecified chronic kidney disease: Secondary | ICD-10-CM | POA: Diagnosis present

## 2014-02-06 DIAGNOSIS — Z9049 Acquired absence of other specified parts of digestive tract: Secondary | ICD-10-CM | POA: Diagnosis present

## 2014-02-06 DIAGNOSIS — E039 Hypothyroidism, unspecified: Secondary | ICD-10-CM | POA: Diagnosis present

## 2014-02-06 DIAGNOSIS — Z91048 Other nonmedicinal substance allergy status: Secondary | ICD-10-CM | POA: Diagnosis not present

## 2014-02-06 DIAGNOSIS — Z8673 Personal history of transient ischemic attack (TIA), and cerebral infarction without residual deficits: Secondary | ICD-10-CM | POA: Diagnosis not present

## 2014-02-06 DIAGNOSIS — Z96652 Presence of left artificial knee joint: Secondary | ICD-10-CM | POA: Diagnosis present

## 2014-02-06 DIAGNOSIS — I2510 Atherosclerotic heart disease of native coronary artery without angina pectoris: Secondary | ICD-10-CM | POA: Diagnosis present

## 2014-02-06 DIAGNOSIS — I959 Hypotension, unspecified: Secondary | ICD-10-CM | POA: Diagnosis present

## 2014-02-06 LAB — CBC
HEMATOCRIT: 34.9 % — AB (ref 36.0–46.0)
HEMOGLOBIN: 11.7 g/dL — AB (ref 12.0–15.0)
MCH: 33.3 pg (ref 26.0–34.0)
MCHC: 33.5 g/dL (ref 30.0–36.0)
MCV: 99.4 fL (ref 78.0–100.0)
Platelets: 144 10*3/uL — ABNORMAL LOW (ref 150–400)
RBC: 3.51 MIL/uL — AB (ref 3.87–5.11)
RDW: 18.9 % — AB (ref 11.5–15.5)
WBC: 3.5 10*3/uL — ABNORMAL LOW (ref 4.0–10.5)

## 2014-02-06 LAB — PROTIME-INR
INR: 1.4 (ref 0.00–1.49)
Prothrombin Time: 17.3 s — ABNORMAL HIGH (ref 11.6–15.2)

## 2014-02-06 LAB — COMPREHENSIVE METABOLIC PANEL
ALT: 23 U/L (ref 0–35)
AST: 74 U/L — AB (ref 0–37)
Albumin: 3.1 g/dL — ABNORMAL LOW (ref 3.5–5.2)
Alkaline Phosphatase: 82 U/L (ref 39–117)
Anion gap: 16 — ABNORMAL HIGH (ref 5–15)
BUN: 19 mg/dL (ref 6–23)
CHLORIDE: 98 meq/L (ref 96–112)
CO2: 22 mEq/L (ref 19–32)
Calcium: 8.2 mg/dL — ABNORMAL LOW (ref 8.4–10.5)
Creatinine, Ser: 1.28 mg/dL — ABNORMAL HIGH (ref 0.50–1.10)
GFR calc Af Amer: 50 mL/min — ABNORMAL LOW (ref >90)
GFR calc non Af Amer: 43 mL/min — ABNORMAL LOW (ref >90)
Glucose, Bld: 93 mg/dL (ref 70–99)
Potassium: 4.4 mEq/L (ref 3.7–5.3)
Sodium: 136 mEq/L — ABNORMAL LOW (ref 137–147)
Total Bilirubin: 1 mg/dL (ref 0.3–1.2)
Total Protein: 6.8 g/dL (ref 6.0–8.3)

## 2014-02-06 LAB — LIPID PANEL
CHOL/HDL RATIO: 2.5 ratio
CHOLESTEROL: 143 mg/dL (ref 0–200)
HDL: 57 mg/dL (ref >39)
LDL Cholesterol: 66 mg/dL (ref 0–99)
TRIGLYCERIDES: 99 mg/dL (ref <150)
VLDL: 20 mg/dL (ref 0–40)

## 2014-02-06 LAB — TROPONIN I
Troponin I: 0.87 ng/mL (ref <0.30)
Troponin I: 0.31 ng/mL (ref <0.30)

## 2014-02-06 NOTE — Progress Notes (Signed)
Patient Name: [name]      SUBJECTIVE:admitted with recurrent inappropriate shocks from 1:1 atial arrhythmias which followed recent ablationattempt by  Dr Burnett Sheng at [facility]  Also with hx of recent syncopal VT Hx of previous deviceCRTD that required extraction for infection   She is s/p AV ablation  She  has known CAD with remote stents - last cath in 06/2013 with patent LAD and LCX stents). Negative Myoview from 04/2013 with no ischemia Past Medical History  Diagnosis Date  . Spinal stenosis   . Numbness of foot   . Diverticulitis     a. s/p partial colectomy 1/12 with reversal in July 2012.  . Colitis, ischemic   . DJD (degenerative joint disease)   . Ischemic cardiomyopathy     a. Echo 06/16/10: EF 25-30%, anteroseptal and apical hypokinesis, moderate AI, mild MR.   . LV (left ventricular) mural thrombus   . CKD (chronic kidney disease), stage III   . GERD (gastroesophageal reflux disease)   . Hypertension   . Hypothyroidism   . Hyperlipidemia   . Compression fracture     a. of L2  . 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 2/2 atrial fibrillation with RVR in the context of hypokalemia  . PAF (paroxysmal atrial fibrillation)     a. on Eliquis, digoxin, and amiodarone  . Anemia   . H/O hiatal hernia   . Sinus bradycardia   . Mitral regurgitation   . Chronic systolic CHF (congestive heart failure)   . CAD (coronary artery disease)     a.  Ant MI 8/03 with stenting to LAD;  b. staged PCI w/ DES to OM1 8/03;  c. s/p DES to LAD 7/04;  d.  multiple caths in past (chronically abnl ECG); e. cardiac cath 3/12: LAD stent patent, distal LAD 40-50%, small ostial D1 80%, ostial D2 60%, OM-1 stent patent (20-30%), proximal RCA 50%, prox- mRCA 40-50%; f. cath 2012 nonobs g. cath 06/2013 and 08/2013 for "STEMI" - mild nonobst CAD only.  [name] History of TIA (transient ischemic attack)   . Automatic implantable cardioverter-defibrillator in situ  08/05/2013    a. s/p ICD implantation in 2005. b. Gen change 2006. 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 c. s/p CRT-D implantation 07/2013.  [name] Paroxysmal atrial tachycardia   . Paroxysmal atrial flutter   . Stroke   . Osteoporosis   . Oral thrush     a. By EGD 09/2013.  [name] Antral gastritis     a. By EGD 09/2013 - minimal.  . Ventricular tachycardia     a. 01/16/14 s/p amiodarone loading and ICD reprogramming    Scheduled Meds:  Scheduled Meds: . amiodarone  200 mg Oral BID  . apixaban  5 mg Oral BID  . carvedilol  3.125 mg Oral BID WC  . furosemide  40 mg Oral Daily  . levothyroxine  200 mcg Oral QAC breakfast  . loratadine  10 mg Oral Daily  . omega-3 acid ethyl esters  1 g Oral Q2000  . pantoprazole  40 mg Oral BID  . raloxifene  60 mg Oral Daily  . spironolactone  25 mg Oral Daily   Continuous Infusions:  acetaminophen, clonazePAM, fluticasone, nitroGLYCERIN, ondansetron (ZOFRAN) IV, polyethylene glycol    PHYSICAL EXAM Filed Vitals:   02/05/14 2318 02/06/14 0020 02/06/14 0226 02/06/14 0603  BP: 98/55   116/63  Pulse: 65  87  Temp: 98.1 F (36.7 C)   98.2 F (36.8 C)  TempSrc: Oral   Oral  Resp: 18   20  Height:      Weight:  59.2 kg (130 lb 8.2 oz) 59.2 kg (130 lb 8.2 oz)   SpO2: 100%   100%    Well developed and nourished in no acute distress HENT normal Neck supple with JVP-flat Clear Regular rate and rhythm, no murmurs or gallops Abd-soft with active BS No Clubbing cyanosis edema Skin-warm and dry A & Oriented  Grossly normal sensory and motor function   TELEMETRY: Reviewed telemetry pt in AV paced :    Intake/Output Summary (Last 24 hours) at 02/06/14 0710 Last data filed at 02/05/14 2320  Gross per 24 hour  Intake    240 ml  Output    300 ml  Net    -60 ml    LABS: Basic Metabolic Panel:  Recent Labs Lab 02/05/14 1035  NA 141  K 3.7  CL 101  CO2 23  GLUCOSE 127*  BUN 18    CREATININE 1.38*  CALCIUM 8.6   Cardiac Enzymes:  Recent Labs  02/05/14 2230 02/06/14 0057  TROPONINI 0.65* 0.87*   CBC:  Recent Labs Lab 02/05/14 1035  WBC 4.0  HGB 11.9*  HCT 35.9*  MCV 100.0  PLT 170   PROTIME:  Recent Labs  02/05/14 2230  LABPROT 15.4*  INR 1.21   Liver Function Tests: No results for input(s): AST, ALT, ALKPHOS, BILITOT, PROT, ALBUMIN in the last 72 hours. No results for input(s): LIPASE, AMYLASE in the last 72 hours. BNP: BNP (last 3 results)  Recent Labs  06/04/13 0957 06/18/13 1327 02/05/14 2230  PROBNP 2458.0* 1223.0* 5151.0*   D-Dimer: No results for input(s): DDIMER in the last 72 hours. Hemoglobin A1C: No results for input(s): HGBA1C in the last 72 hours. Fasting Lipid Panel:  Recent Labs  02/06/14 0057  CHOL 143  HDL 57  LDLCALC 66  TRIG 99  CHOLHDL 2.5   Thyroid Function Tests:  Recent Labs  02/05/14 2230  TSH 1.740   Anemia Panel: No results for input(s): VITAMINB12, FOLATE, FERRITIN, TIBC, IRON, RETICCTPCT in the last 72 hours.   Device Interrogation: stbbel junctional  rhythm   ASSESSMENT AND PLAN:  Active Problems:   Inappropriate shocks from ICD (implantable cardioverter-defibrillator)   Atrial tachycardia ambutlat today following AV ablation yesterday Now device dependent  Anticipate discharge tomorrow   Signed, Virl Axe MD  02/06/2014

## 2014-02-07 ENCOUNTER — Encounter (HOSPITAL_COMMUNITY): Payer: Self-pay | Admitting: Cardiology

## 2014-02-07 DIAGNOSIS — Z9889 Other specified postprocedural states: Secondary | ICD-10-CM

## 2014-02-07 HISTORY — PX: AV NODE ABLATION: SHX1209

## 2014-02-07 MED ORDER — ACETAMINOPHEN 325 MG PO TABS: 650.0000 mg | ORAL_TABLET | ORAL | Status: AC | PRN

## 2014-02-07 MED ORDER — FUROSEMIDE 40 MG PO TABS: 40.0000 mg | ORAL_TABLET | ORAL | Status: DC

## 2014-02-07 MED ORDER — SPIRONOLACTONE 25 MG PO TABS: 12.5000 mg | ORAL_TABLET | Freq: Every day | ORAL | Status: AC

## 2014-02-07 MED ORDER — FUROSEMIDE 40 MG PO TABS
40.0000 mg | ORAL_TABLET | ORAL | Status: DC
  Filled 2014-02-07: qty 1

## 2014-02-07 MED ORDER — SPIRONOLACTONE 12.5 MG HALF TABLET: 12.5000 mg | ORAL_TABLET | Freq: Every day | ORAL | Status: DC

## 2014-02-07 MED ORDER — FUROSEMIDE 40 MG PO TABS: 40.0000 mg | ORAL_TABLET | ORAL | Status: AC

## 2014-02-07 NOTE — Progress Notes (Signed)
Patient Name: [name]      SUBJECTIVE:admitted with recurrent inappropriate shocks from 1:1 atial arrhythmias which followed recent ablationattempt by  Dr Burnett Sheng at [facility]  Also with hx of recent syncopal VT Hx of previous deviceCRTD that required extraction for infection   She is s/p AV ablation  She  has known CAD with remote stents - last cath in 06/2013 with patent LAD and LCX stents). Negative Myoview from 04/2013 with no ischemia Past Medical History  Diagnosis Date  . Spinal stenosis   . Numbness of foot   . Diverticulitis     a. s/p partial colectomy 1/12 with reversal in July 2012.  . Colitis, ischemic   . DJD (degenerative joint disease)   . Ischemic cardiomyopathy     a. Echo 06/16/10: EF 25-30%, anteroseptal and apical hypokinesis, moderate AI, mild MR.   . LV (left ventricular) mural thrombus   . CKD (chronic kidney disease), stage III   . GERD (gastroesophageal reflux disease)   . Hypertension   . Hypothyroidism   . Hyperlipidemia   . Compression fracture     a. of L2  . 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 2/2 atrial fibrillation with RVR in the context of hypokalemia  . PAF (paroxysmal atrial fibrillation)     a. on Eliquis, digoxin, and amiodarone  . Anemia   . H/O hiatal hernia   . Sinus bradycardia   . Mitral regurgitation   . Chronic systolic CHF (congestive heart failure)   . CAD (coronary artery disease)     a.  Ant MI 8/03 with stenting to LAD;  b. staged PCI w/ DES to OM1 8/03;  c. s/p DES to LAD 7/04;  d.  multiple caths in past (chronically abnl ECG); e. cardiac cath 3/12: LAD stent patent, distal LAD 40-50%, small ostial D1 80%, ostial D2 60%, OM-1 stent patent (20-30%), proximal RCA 50%, prox- mRCA 40-50%; f. cath 2012 nonobs g. cath 06/2013 and 08/2013 for "STEMI" - mild nonobst CAD only.  [name] History of TIA (transient ischemic attack)   . Automatic implantable cardioverter-defibrillator in situ  08/05/2013    a. s/p ICD implantation in 2005. b. Gen change 2006. 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 c. s/p CRT-D implantation 07/2013.  [name] Paroxysmal atrial tachycardia   . Paroxysmal atrial flutter   . Stroke   . Osteoporosis   . Oral thrush     a. By EGD 09/2013.  [name] Antral gastritis     a. By EGD 09/2013 - minimal.  . Ventricular tachycardia     a. 01/16/14 s/p amiodarone loading and ICD reprogramming    Scheduled Meds:  Scheduled Meds: . amiodarone  200 mg Oral BID  . apixaban  5 mg Oral BID  . carvedilol  3.125 mg Oral BID WC  . furosemide  40 mg Oral Daily  . levothyroxine  200 mcg Oral QAC breakfast  . loratadine  10 mg Oral Daily  . omega-3 acid ethyl esters  1 g Oral Q2000  . pantoprazole  40 mg Oral BID  . raloxifene  60 mg Oral Daily  . spironolactone  25 mg Oral Daily   Continuous Infusions:  acetaminophen, clonazePAM, fluticasone, nitroGLYCERIN, ondansetron (ZOFRAN) IV, polyethylene glycol    PHYSICAL EXAM Filed Vitals:   02/06/14 1438 02/06/14 1605 02/06/14 2046 02/07/14 0500  BP: 105/64 102/60 91/48 98/55   Pulse: 74  93 63 64  Temp: 98 F (36.7 C) 97.9 F (36.6 C) 98.4 F (36.9 C) 98.6 F (37 C)  TempSrc: Oral Oral Oral Oral  Resp: 20 19 18 18   Height:    5\' 4"  (1.626 m)  Weight:    58.2 kg (128 lb 4.9 oz)  SpO2: 100% 100% 100% 100%    Well developed and nourished in no acute distress HENT normal Neck supple with JVP-flat Clear Regular rate and rhythm, no murmurs or gallops Abd-soft with active BS No Clubbing cyanosis edema Skin-warm and dry A & Oriented  Grossly normal sensory and motor function   TELEMETRY: Reviewed telemetry pt in AV paced :    Intake/Output Summary (Last 24 hours) at 02/07/14 0745 Last data filed at 02/06/14 1900  Gross per 24 hour  Intake    720 ml  Output   1400 ml  Net   -680 ml    LABS: Basic Metabolic Panel:  Recent Labs Lab 02/05/14 1035  02/06/14 0724  NA 141 136*  K 3.7 4.4  CL 101 98  CO2 23 22  GLUCOSE 127* 93  BUN 18 19  CREATININE 1.38* 1.28*  CALCIUM 8.6 8.2*   Cardiac Enzymes:  Recent Labs  02/05/14 2230 02/06/14 0057 02/06/14 0724  TROPONINI 0.65* 0.87* 0.31*   CBC:  Recent Labs Lab 02/05/14 1035 02/06/14 0724  WBC 4.0 3.5*  HGB 11.9* 11.7*  HCT 35.9* 34.9*  MCV 100.0 99.4  PLT 170 144*   PROTIME:  Recent Labs  02/05/14 2230 02/06/14 0724  LABPROT 15.4* 17.3*  INR 1.21 1.40   Liver Function Tests:  Recent Labs  02/06/14 0724  AST 74*  ALT 23  ALKPHOS 82  BILITOT 1.0  PROT 6.8  ALBUMIN 3.1*   No results for input(s): LIPASE, AMYLASE in the last 72 hours. BNP: BNP (last 3 results)  Recent Labs  06/04/13 0957 06/18/13 1327 02/05/14 2230  PROBNP 2458.0* 1223.0* 5151.0*   D-Dimer: No results for input(s): DDIMER in the last 72 hours. Hemoglobin A1C: No results for input(s): HGBA1C in the last 72 hours. Fasting Lipid Panel:  Recent Labs  02/06/14 0057  CHOL 143  HDL 57  LDLCALC 66  TRIG 99  CHOLHDL 2.5   Thyroid Function Tests:  Recent Labs  02/05/14 2230  TSH 1.740   Anemia Panel: No results for input(s): VITAMINB12, FOLATE, FERRITIN, TIBC, IRON, RETICCTPCT in the last 72 hours.   Device Interrogation: stbbel junctional  rhythm   ASSESSMENT AND PLAN:  Active Problems:   Inappropriate shocks from ICD (implantable cardioverter-defibrillator)   Atrial tachycardia   Now device dependent   discharge on aldactone 12.5 with low blood pressure And lasix 40 qod Has followup scheduled  Signed, Virl Axe MD  02/07/2014

## 2014-02-07 NOTE — Care Management Note (Signed)
    Page 1 of 1   02/07/2014     1:58:59 PM CARE MANAGEMENT NOTE 02/07/2014  Patient:  [name],[name]   Account Number:  [id number]  Date Initiated:  02/07/2014  Documentation initiated by:  Marvetta Gibbons  Subjective/Objective Assessment:   Pt admitted with afib- s/p ablation     Action/Plan:   PTA pt lived at home- plan to return home   Anticipated DC Date:  02/07/2014   Anticipated DC Plan:  HOME/SELF CARE         Choice offered to / List presented to:             Status of service:  Completed, signed off Medicare Important Message given?  NA - LOS <3 / Initial given by admissions (If response is "NO", the following Medicare IM given date fields will be blank) Date Medicare IM given:   Medicare IM given by:   Date Additional Medicare IM given:   Additional Medicare IM given by:    Discharge Disposition:  HOME/SELF CARE  Per UR Regulation:  Reviewed for med. necessity/level of care/duration of stay  If discussed at [place] of Stay Meetings, dates discussed:    Comments:

## 2014-02-07 NOTE — Progress Notes (Addendum)
Patient given discharge instructions medication list and AVS, all questions answered,VSS will discharge home as ordered. Lular Letson, Bettina Gavia RN

## 2014-02-07 NOTE — Discharge Instructions (Signed)
No driving for 5 days, no lifting over 5 pounds for 1 week, no sex for 1 week  May return to work in 1 week if you still work.  Keep site clean and dry. Call [facility] [phone] if any bleeding, swelling or drainage at cath site.  May shower, no tub baths or pool for 1 week.   Heart Healthy diet   Your BP was low so we decreased you dose of lasix and spironolactone. Call if you have swelling,  weight gain or SOB

## 2014-02-07 NOTE — Discharge Summary (Signed)
Physician Discharge Summary       Patient ID: MARILLYN GOREN MRN: [id number] DOB/AGE: 12/02/1950 63 y.o.  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 Negative Myoview from 04/2013 with no ischemia  Discharged Condition: good  Procedures: AV node ablation by Dr. Lovena Le 02/05/14  Hospital Course: 63 year old female - former patient of Dr. Susa Simmonds - that Dr Lovena Le has previously seen. She has multiple medical issues which include ICM with ICD implant in 2005, generator change in 2006, DDD upgrade in 2014 with subsequent extraction due to infection in 11/2012, followed by CRT-D implant in May of 2015. She has known CAD with remote stents - last cath in 06/2013 with patent LAD and LCX stents). Negative Myoview from 04/2013 with no ischemia. She has had prior CVA, CKD, and atrial arrhythmias - on amiodarone and she has had prior AF ablation per Dr. Ola Spurr. Has has extensive DJD with multiple past surgeries.   Most recently presented to [facility] with VT - rate of 176 - below her detection rate - had emergent cardioversion in the ER due to unstable hemodynamics. ICD reprogrammed. Amiodarone was increased. She was instructed that she couldnot drive for 6 months. She was also treated for acute on chronic systolic HF. She did have worsening renal function. Maintained only on  Eliquis.   On the 18th  She presented to the ER with her ICD firing.   The patient has developed 1:1 atrial tachycardia  with recurrent ICD shocks with an atrial and ventricular rate of 180/min. She denied medical non-compliance. Dr. Lovena Le discussed the treatment options with the patient and recommended proceeding with AV node ablation. She underwent the ablation without complications.  She was seen by Dr. Caryl Comes day of discharge and found stable for discharge.  Her lasix and aldactone were decreased due to lower BP 98/55.  She will follow up with Dr. Caryl Comes and HF clinic.    Consults: None  Significant Diagnostic Studies:  BMET    Component Value Date/Time   NA 136* 02/06/2014 0724   K 4.4 02/06/2014 0724   CL 98 02/06/2014 0724   CO2 22 02/06/2014 0724   GLUCOSE 93 02/06/2014 0724   BUN 19 02/06/2014 0724   CREATININE 1.28* 02/06/2014 0724   CALCIUM 8.2* 02/06/2014 0724   GFRNONAA 43* 02/06/2014 0724   GFRAA 50* 02/06/2014 0724   CBC    Component Value Date/Time   WBC 3.5* 02/06/2014 0724   RBC 3.51* 02/06/2014 0724   HGB 11.7* 02/06/2014 0724   HCT 34.9* 02/06/2014 0724   PLT 144* 02/06/2014 0724   MCV 99.4 02/06/2014 0724   MCH 33.3 02/06/2014 0724   MCHC 33.5 02/06/2014 0724   RDW 18.9* 02/06/2014 0724   LYMPHSABS 1.0 01/16/2014 1325   MONOABS 0.7 01/16/2014 1325   EOSABS 0.0 01/16/2014 1325   BASOSABS 0.0 01/16/2014 1325    Troponin bump due to ICD shocks, Pk 0.87  BNP    Component Value Date/Time   PROBNP 5151.0* 02/05/2014 2230    Lipid Panel     Component Value Date/Time   CHOL 143 02/06/2014 0057   TRIG 99 02/06/2014 0057   HDL 57 02/06/2014 0057   CHOLHDL 2.5 02/06/2014 0057   VLDL 20 02/06/2014 0057   LDLCALC 66 02/06/2014 0057   LDLDIRECT 126.3 12/15/2011 0922     INR 1.40 but pt on eliquis  TSH 1.740  PORTABLE CHEST - 1 VIEW COMPARISON: 01/18/2014 and earlier. FINDINGS: Portable AP upright view at 1116 hr. Right chest 3 lead cardiac  AICD is stable. Stable cardiomegaly and mediastinal contours. No pneumothorax or pulmonary edema. Chronic streaky left lung base opacity, No definite pleural effusion. Postoperative and post vertebroplasty/ kyphoplasty changes in the spine. Streaky left lung base opacity re- identified and mildly regressed. IMPRESSION: 1. Continued streaky left lung base opacity since October, could reflect atelectasis or unresolved infection. 2. No new cardiopulmonary abnormality identified.   Discharge Exam: Blood pressure 93/54, pulse 69, temperature 98.6 F (37 C), temperature source Oral, resp. rate 18, height 5\' 4"  (1.626 m), weight 128 lb 4.9 oz (58.2 kg), SpO2 100 %.  Disposition: 01-Home or Self Care     Medication List    ASK your doctor about these medications        amiodarone 200 MG tablet  Commonly known as:  PACERONE  Take 1 tablet (200 mg total) by mouth 2 (two) times daily.     apixaban 5 MG Tabs tablet  Commonly known as:  ELIQUIS  Take 1 tablet (5 mg total) by mouth 2 (two) times daily.     carvedilol 3.125 MG tablet  Commonly known as:  COREG  Take 1 tablet (3.125 mg total) by mouth 2 (two) times daily with a meal.     clonazePAM 0.5 MG tablet  Commonly known as:  KLONOPIN  Take 1 tablet (0.5 mg total) by mouth 2 (two) times daily as needed for anxiety.     fexofenadine 180 MG tablet  Commonly known as:  ALLEGRA  Take 180 mg by mouth daily as needed for allergies.     fluticasone 50 MCG/ACT nasal spray  Commonly known as:  FLONASE  Place 2 sprays into both nostrils daily as needed for allergies.     furosemide 40 MG tablet  Commonly known as:  LASIX  Take 1 tablet (40 mg total) by mouth daily.     levothyroxine 200 MCG tablet  Commonly known as:  SYNTHROID, LEVOTHROID  Take 200 mcg by mouth daily before breakfast.     nitroGLYCERIN 0.4 MG SL tablet  Commonly known as:  NITROSTAT  Place 0.4 mg under the tongue every 5 (five) minutes as needed for chest  pain.     omega-3 acid ethyl esters 1 G capsule  Commonly known as:  LOVAZA  Take 1 g by mouth daily.     ondansetron 4 MG disintegrating tablet  Commonly known as:  ZOFRAN-ODT  Take 4 mg by mouth 2 (two) times daily as needed for nausea or vomiting.     pantoprazole 40 MG tablet  Commonly known as:  PROTONIX  Take 1 tablet (40 mg total) by mouth 2 (two) times daily.     polyethylene glycol packet  Commonly known as:  MIRALAX / GLYCOLAX  Take 17 g by mouth daily as needed (for constipation).     raloxifene 60 MG tablet  Commonly known as:  EVISTA  Take 60  mg by mouth daily.     spironolactone 25 MG tablet  Commonly known as:  ALDACTONE  Take 25 mg by mouth daily.          Discharge Instructions: No driving for 5 days, no lifting over 5 pounds for 1 week, no sex for 1 week  May return to work in 1 week if you still work.  Keep site clean and dry. Call [facility] [phone] if any bleeding, swelling or drainage at cath site.  May shower, no tub baths or pool for 1 week.   Heart Healthy diet   Your BP was low so we decreased you dose of lasix and spironolactone. Call if you have swelling,  weight gain or SOB     Signed: WYBRKV,TXLEZ R Nurse Practitioner-Certified [facility]: [facility] 02/07/2014, 10:34 AM  Time spent on discharge :>30 minutes.

## 2014-02-10 ENCOUNTER — Encounter (HOSPITAL_COMMUNITY): Payer: Medicare Other

## 2014-02-17 ENCOUNTER — Telehealth: Payer: Self-pay | Admitting: Internal Medicine

## 2014-02-17 DIAGNOSIS — Z79899 Other long term (current) drug therapy: Secondary | ICD-10-CM

## 2014-02-17 DIAGNOSIS — I472 Ventricular tachycardia: Secondary | ICD-10-CM

## 2014-02-17 DIAGNOSIS — I255 Ischemic cardiomyopathy: Secondary | ICD-10-CM

## 2014-02-17 NOTE — Telephone Encounter (Signed)
Would like order to resume occupational Therapy for 2 time a week for this week and they recertifing patient for 2 times a week for four weeks.

## 2014-02-17 NOTE — Telephone Encounter (Signed)
Left vm for Marlowe Kays, from [facility] approving orders.

## 2014-02-17 NOTE — Telephone Encounter (Signed)
Please advise 

## 2014-02-17 NOTE — Telephone Encounter (Signed)
ok 

## 2014-02-19 ENCOUNTER — Encounter: Payer: Self-pay | Admitting: Internal Medicine

## 2014-02-24 ENCOUNTER — Other Ambulatory Visit: Payer: Self-pay | Admitting: Orthopedic Surgery

## 2014-02-24 DIAGNOSIS — M549 Dorsalgia, unspecified: Secondary | ICD-10-CM

## 2014-02-26 ENCOUNTER — Encounter (HOSPITAL_COMMUNITY): Payer: Self-pay | Admitting: Cardiology

## 2014-02-27 ENCOUNTER — Ambulatory Visit
Admission: RE | Admit: 2014-02-27 | Discharge: 2014-02-27 | Disposition: A | Discharge status: Home / Self Care | Payer: Medicare Other | Source: Ambulatory Visit | Attending: Orthopedic Surgery | Admitting: Orthopedic Surgery

## 2014-02-27 ENCOUNTER — Encounter (HOSPITAL_COMMUNITY): Payer: Self-pay | Admitting: Internal Medicine

## 2014-02-27 DIAGNOSIS — M549 Dorsalgia, unspecified: Secondary | ICD-10-CM

## 2014-03-06 ENCOUNTER — Encounter (HOSPITAL_COMMUNITY): Payer: Self-pay | Admitting: Emergency Medicine

## 2014-03-06 ENCOUNTER — Observation Stay (HOSPITAL_COMMUNITY)
Admission: EM | Admit: 2014-03-06 | Discharge: 2014-03-07 | Disposition: A | Discharge status: Home / Self Care | Payer: Medicare Other | Attending: Internal Medicine | Admitting: Internal Medicine

## 2014-03-06 ENCOUNTER — Encounter: Payer: Self-pay | Admitting: Internal Medicine

## 2014-03-06 ENCOUNTER — Emergency Department (HOSPITAL_COMMUNITY): Payer: Medicare Other

## 2014-03-06 DIAGNOSIS — I4892 Unspecified atrial flutter: Secondary | ICD-10-CM | POA: Insufficient documentation

## 2014-03-06 DIAGNOSIS — I472 Ventricular tachycardia, unspecified: Secondary | ICD-10-CM

## 2014-03-06 DIAGNOSIS — I471 Supraventricular tachycardia: Secondary | ICD-10-CM | POA: Insufficient documentation

## 2014-03-06 DIAGNOSIS — D649 Anemia, unspecified: Secondary | ICD-10-CM | POA: Diagnosis not present

## 2014-03-06 DIAGNOSIS — Z8781 Personal history of (healed) traumatic fracture: Secondary | ICD-10-CM | POA: Insufficient documentation

## 2014-03-06 DIAGNOSIS — I340 Nonrheumatic mitral (valve) insufficiency: Secondary | ICD-10-CM | POA: Insufficient documentation

## 2014-03-06 DIAGNOSIS — M1990 Unspecified osteoarthritis, unspecified site: Secondary | ICD-10-CM | POA: Diagnosis not present

## 2014-03-06 DIAGNOSIS — I480 Paroxysmal atrial fibrillation: Secondary | ICD-10-CM | POA: Insufficient documentation

## 2014-03-06 DIAGNOSIS — N183 Chronic kidney disease, stage 3 (moderate): Secondary | ICD-10-CM | POA: Insufficient documentation

## 2014-03-06 DIAGNOSIS — R011 Cardiac murmur, unspecified: Secondary | ICD-10-CM | POA: Insufficient documentation

## 2014-03-06 DIAGNOSIS — I5022 Chronic systolic (congestive) heart failure: Secondary | ICD-10-CM | POA: Diagnosis not present

## 2014-03-06 DIAGNOSIS — K219 Gastro-esophageal reflux disease without esophagitis: Secondary | ICD-10-CM | POA: Diagnosis not present

## 2014-03-06 DIAGNOSIS — T82198A Other mechanical complication of other cardiac electronic device, initial encounter: Secondary | ICD-10-CM | POA: Diagnosis not present

## 2014-03-06 DIAGNOSIS — M810 Age-related osteoporosis without current pathological fracture: Secondary | ICD-10-CM | POA: Insufficient documentation

## 2014-03-06 DIAGNOSIS — I255 Ischemic cardiomyopathy: Secondary | ICD-10-CM | POA: Diagnosis not present

## 2014-03-06 DIAGNOSIS — K559 Vascular disorder of intestine, unspecified: Secondary | ICD-10-CM | POA: Insufficient documentation

## 2014-03-06 DIAGNOSIS — Y831 Surgical operation with implant of artificial internal device as the cause of abnormal reaction of the patient, or of later complication, without mention of misadventure at the time of the procedure: Secondary | ICD-10-CM | POA: Diagnosis not present

## 2014-03-06 DIAGNOSIS — I252 Old myocardial infarction: Secondary | ICD-10-CM | POA: Diagnosis not present

## 2014-03-06 DIAGNOSIS — I129 Hypertensive chronic kidney disease with stage 1 through stage 4 chronic kidney disease, or unspecified chronic kidney disease: Secondary | ICD-10-CM | POA: Insufficient documentation

## 2014-03-06 DIAGNOSIS — Z9889 Other specified postprocedural states: Secondary | ICD-10-CM | POA: Insufficient documentation

## 2014-03-06 DIAGNOSIS — I442 Atrioventricular block, complete: Secondary | ICD-10-CM

## 2014-03-06 DIAGNOSIS — I2510 Atherosclerotic heart disease of native coronary artery without angina pectoris: Secondary | ICD-10-CM | POA: Insufficient documentation

## 2014-03-06 DIAGNOSIS — Z7902 Long term (current) use of antithrombotics/antiplatelets: Secondary | ICD-10-CM | POA: Diagnosis not present

## 2014-03-06 DIAGNOSIS — Z8619 Personal history of other infectious and parasitic diseases: Secondary | ICD-10-CM | POA: Insufficient documentation

## 2014-03-06 DIAGNOSIS — Z880 Allergy status to penicillin: Secondary | ICD-10-CM | POA: Diagnosis not present

## 2014-03-06 DIAGNOSIS — I213 ST elevation (STEMI) myocardial infarction of unspecified site: Secondary | ICD-10-CM | POA: Insufficient documentation

## 2014-03-06 DIAGNOSIS — M4800 Spinal stenosis, site unspecified: Secondary | ICD-10-CM | POA: Diagnosis not present

## 2014-03-06 DIAGNOSIS — I519 Heart disease, unspecified: Secondary | ICD-10-CM | POA: Insufficient documentation

## 2014-03-06 DIAGNOSIS — Z79899 Other long term (current) drug therapy: Secondary | ICD-10-CM | POA: Diagnosis not present

## 2014-03-06 DIAGNOSIS — Z4502 Encounter for adjustment and management of automatic implantable cardiac defibrillator: Secondary | ICD-10-CM | POA: Insufficient documentation

## 2014-03-06 DIAGNOSIS — E039 Hypothyroidism, unspecified: Secondary | ICD-10-CM | POA: Insufficient documentation

## 2014-03-06 DIAGNOSIS — E785 Hyperlipidemia, unspecified: Secondary | ICD-10-CM | POA: Insufficient documentation

## 2014-03-06 DIAGNOSIS — R079 Chest pain, unspecified: Secondary | ICD-10-CM | POA: Diagnosis present

## 2014-03-06 DIAGNOSIS — Z87891 Personal history of nicotine dependence: Secondary | ICD-10-CM | POA: Insufficient documentation

## 2014-03-06 DIAGNOSIS — I4819 Other persistent atrial fibrillation: Secondary | ICD-10-CM | POA: Insufficient documentation

## 2014-03-06 DIAGNOSIS — K5792 Diverticulitis of intestine, part unspecified, without perforation or abscess without bleeding: Secondary | ICD-10-CM | POA: Insufficient documentation

## 2014-03-06 DIAGNOSIS — I481 Persistent atrial fibrillation: Secondary | ICD-10-CM

## 2014-03-06 DIAGNOSIS — K2950 Unspecified chronic gastritis without bleeding: Secondary | ICD-10-CM | POA: Insufficient documentation

## 2014-03-06 HISTORY — DX: Personal history of other medical treatment: Z92.89

## 2014-03-06 LAB — CBC WITH DIFFERENTIAL/PLATELET
BASOS ABS: 0 10*3/uL (ref 0.0–0.1)
Basophils Relative: 0 % (ref 0–1)
EOS PCT: 0 % (ref 0–5)
Eosinophils Absolute: 0 10*3/uL (ref 0.0–0.7)
HEMATOCRIT: 34 % — AB (ref 36.0–46.0)
Hemoglobin: 11.7 g/dL — ABNORMAL LOW (ref 12.0–15.0)
LYMPHS ABS: 0.9 10*3/uL (ref 0.7–4.0)
Lymphocytes Relative: 17 % (ref 12–46)
MCH: 34.2 pg — ABNORMAL HIGH (ref 26.0–34.0)
MCHC: 34.4 g/dL (ref 30.0–36.0)
MCV: 99.4 fL (ref 78.0–100.0)
MONO ABS: 0.6 10*3/uL (ref 0.1–1.0)
Monocytes Relative: 10 % (ref 3–12)
Neutro Abs: 3.8 10*3/uL (ref 1.7–7.7)
Neutrophils Relative %: 73 % (ref 43–77)
PLATELETS: 189 10*3/uL (ref 150–400)
RBC: 3.42 MIL/uL — AB (ref 3.87–5.11)
RDW: 17.4 % — AB (ref 11.5–15.5)
WBC: 5.3 10*3/uL (ref 4.0–10.5)

## 2014-03-06 LAB — MRSA PCR SCREENING: MRSA by PCR: NEGATIVE

## 2014-03-06 LAB — BASIC METABOLIC PANEL
ANION GAP: 18 — AB (ref 5–15)
BUN: 13 mg/dL (ref 6–23)
CALCIUM: 8.9 mg/dL (ref 8.4–10.5)
CHLORIDE: 99 meq/L (ref 96–112)
CO2: 22 mEq/L (ref 19–32)
Creatinine, Ser: 0.96 mg/dL (ref 0.50–1.10)
GFR calc Af Amer: 71 mL/min — ABNORMAL LOW (ref >90)
GFR calc non Af Amer: 61 mL/min — ABNORMAL LOW (ref >90)
GLUCOSE: 84 mg/dL (ref 70–99)
Potassium: 3.8 mEq/L (ref 3.7–5.3)
SODIUM: 139 meq/L (ref 137–147)

## 2014-03-06 LAB — TROPONIN I

## 2014-03-06 MED ORDER — SODIUM CHLORIDE 0.9 % IJ SOLN: 3.0000 mL | INTRAMUSCULAR | Status: DC | PRN

## 2014-03-06 MED ORDER — RALOXIFENE HCL 60 MG PO TABS
60.0000 mg | ORAL_TABLET | Freq: Every day | ORAL | Status: DC
  Administered 2014-03-06 – 2014-03-07 (×2): 60 mg via ORAL
  Filled 2014-03-06 (×2): qty 1

## 2014-03-06 MED ORDER — LEVOTHYROXINE SODIUM 100 MCG PO TABS
200.0000 ug | ORAL_TABLET | Freq: Every day | ORAL | Status: DC
  Administered 2014-03-07: 200 ug via ORAL
  Filled 2014-03-06: qty 2

## 2014-03-06 MED ORDER — SODIUM CHLORIDE 0.9 % IJ SOLN
3.0000 mL | Freq: Two times a day (BID) | INTRAMUSCULAR | Status: DC
  Administered 2014-03-06: 3 mL via INTRAVENOUS

## 2014-03-06 MED ORDER — POLYETHYLENE GLYCOL 3350 17 G PO PACK: 17.0000 g | PACK | Freq: Every day | ORAL | Status: DC | PRN

## 2014-03-06 MED ORDER — ACETAMINOPHEN 325 MG PO TABS: 650.0000 mg | ORAL_TABLET | ORAL | Status: DC | PRN

## 2014-03-06 MED ORDER — FLUTICASONE PROPIONATE 50 MCG/ACT NA SUSP
2.0000 | Freq: Every day | NASAL | Status: DC | PRN
  Filled 2014-03-06: qty 16

## 2014-03-06 MED ORDER — SPIRONOLACTONE 25 MG PO TABS
12.5000 mg | ORAL_TABLET | Freq: Every day | ORAL | Status: DC
  Administered 2014-03-06 – 2014-03-07 (×2): 12.5 mg via ORAL
  Filled 2014-03-06 (×2): qty 1

## 2014-03-06 MED ORDER — MEXILETINE HCL 200 MG PO CAPS
200.0000 mg | ORAL_CAPSULE | Freq: Two times a day (BID) | ORAL | Status: DC
Start: 2014-03-06 — End: 2014-03-07
  Administered 2014-03-06 – 2014-03-07 (×2): 200 mg via ORAL
  Filled 2014-03-06 (×3): qty 1

## 2014-03-06 MED ORDER — OMEGA-3-ACID ETHYL ESTERS 1 G PO CAPS
1.0000 g | ORAL_CAPSULE | Freq: Every day | ORAL | Status: DC
  Administered 2014-03-06 – 2014-03-07 (×2): 1 g via ORAL
  Filled 2014-03-06 (×2): qty 1

## 2014-03-06 MED ORDER — AMIODARONE HCL 200 MG PO TABS
200.0000 mg | ORAL_TABLET | Freq: Every day | ORAL | Status: DC
  Administered 2014-03-06 – 2014-03-07 (×2): 200 mg via ORAL
  Filled 2014-03-06 (×2): qty 1

## 2014-03-06 MED ORDER — FUROSEMIDE 40 MG PO TABS
40.0000 mg | ORAL_TABLET | ORAL | Status: DC
  Administered 2014-03-06: 40 mg via ORAL
  Filled 2014-03-06 (×2): qty 1

## 2014-03-06 MED ORDER — CARVEDILOL 6.25 MG PO TABS
6.2500 mg | ORAL_TABLET | Freq: Two times a day (BID) | ORAL | Status: DC
  Administered 2014-03-06 – 2014-03-07 (×2): 6.25 mg via ORAL
  Filled 2014-03-06 (×2): qty 1

## 2014-03-06 MED ORDER — APIXABAN 5 MG PO TABS
5.0000 mg | ORAL_TABLET | Freq: Two times a day (BID) | ORAL | Status: DC
  Administered 2014-03-06 – 2014-03-07 (×2): 5 mg via ORAL
  Filled 2014-03-06 (×2): qty 1

## 2014-03-06 MED ORDER — NITROGLYCERIN 0.4 MG SL SUBL: 0.4000 mg | SUBLINGUAL_TABLET | SUBLINGUAL | Status: DC | PRN

## 2014-03-06 MED ORDER — ONDANSETRON HCL 4 MG/2ML IJ SOLN
4.0000 mg | Freq: Four times a day (QID) | INTRAMUSCULAR | Status: DC | PRN
  Administered 2014-03-06: 4 mg via INTRAVENOUS
  Filled 2014-03-06: qty 2

## 2014-03-06 MED ORDER — PANTOPRAZOLE SODIUM 40 MG PO TBEC
40.0000 mg | DELAYED_RELEASE_TABLET | Freq: Two times a day (BID) | ORAL | Status: DC
  Administered 2014-03-06 – 2014-03-07 (×2): 40 mg via ORAL
  Filled 2014-03-06 (×2): qty 1

## 2014-03-06 MED ORDER — ONDANSETRON 4 MG PO TBDP
4.0000 mg | ORAL_TABLET | Freq: Two times a day (BID) | ORAL | Status: DC | PRN
  Filled 2014-03-06: qty 1

## 2014-03-06 MED ORDER — CLONAZEPAM 0.5 MG PO TABS: 0.5000 mg | ORAL_TABLET | Freq: Two times a day (BID) | ORAL | Status: DC | PRN

## 2014-03-06 MED ORDER — SODIUM CHLORIDE 0.9 % IV SOLN: 250.0000 mL | INTRAVENOUS | Status: DC | PRN

## 2014-03-06 MED ORDER — OXYCODONE-ACETAMINOPHEN 5-325 MG PO TABS
1.0000 | ORAL_TABLET | Freq: Four times a day (QID) | ORAL | Status: DC | PRN
  Administered 2014-03-06 – 2014-03-07 (×3): 1 via ORAL
  Filled 2014-03-06 (×3): qty 1

## 2014-03-06 NOTE — ED Notes (Signed)
Cardiology at bedside.

## 2014-03-06 NOTE — ED Notes (Signed)
ICD interrogated  

## 2014-03-06 NOTE — ED Provider Notes (Signed)
CSN: [id number]     Arrival date & time 03/06/14  1305 History   First MD Initiated Contact with Patient 03/06/14 1306     Chief Complaint  Patient presents with  . Pacemaker Problem  . Chest Pain   (Consider location/radiation/quality/duration/timing/severity/associated sxs/prior Treatment) HPI [name] is a 63 yo female presenting with report of her ICD firing appr 30 min PTA.  She reports she has been feeling well today prior to it firing.  She notes she was suddenly knocked on to the bed.  She was able to call 911.  This happened appr 1 month ago and required cardioversion. She is pain free currently.  She denies any chest pain, shortness of breath, nausea, vomiting, diarrhea recent illness or injury.     Past Medical History  Diagnosis Date  . Spinal stenosis   . Numbness of foot   . Diverticulitis     a. s/p partial colectomy 1/12 with reversal in July 2012.  . Colitis, ischemic   . DJD (degenerative joint disease)   . Ischemic cardiomyopathy     a. Echo 06/16/10: EF 25-30%, anteroseptal and apical hypokinesis, moderate AI, mild MR.   . LV (left ventricular) mural thrombus   . CKD (chronic kidney disease), stage III   . GERD (gastroesophageal reflux disease)   . Hypertension   . Hypothyroidism   . Hyperlipidemia   . Compression fracture     a. of L2  . 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 2/2 atrial fibrillation with RVR in the context of hypokalemia  . PAF (paroxysmal atrial fibrillation)     a. on Eliquis, digoxin, and amiodarone  . Anemia   . H/O hiatal hernia   . Sinus bradycardia   . Mitral regurgitation   . Chronic systolic CHF (congestive heart failure)   . CAD (coronary artery disease)     a.  Ant MI 8/03 with stenting to LAD;  b. staged PCI w/ DES to OM1 8/03;  c. s/p DES to LAD 7/04;  d.  multiple caths in past (chronically abnl ECG); e. cardiac cath 3/12: LAD stent patent, distal LAD 40-50%, small ostial D1 80%,  ostial D2 60%, OM-1 stent patent (20-30%), proximal RCA 50%, prox- mRCA 40-50%; f. cath 2012 nonobs g. cath 06/2013 and 08/2013 for "STEMI" - mild nonobst CAD only.  [name] History of TIA (transient ischemic attack)   . 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 s/p ICD implantation in 2005. b. Gen change 2006. 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 c. s/p CRT-D implantation 07/2013.  [name] Paroxysmal atrial tachycardia   . Paroxysmal atrial flutter   . Stroke   . Osteoporosis   . Oral thrush     a. By EGD 09/2013.  [name] Antral gastritis     a. By EGD 09/2013 - minimal.  . Ventricular tachycardia     a. 01/16/14 s/p amiodarone loading and ICD reprogramming  . S/P AV nodal ablation, 02/05/14 02/07/2014   Past Surgical History  Procedure Laterality Date  . Back surgery    . Cardiac defibrillator placement  07/2003; 10/2004; 2014  . Lumbar laminectomy/decompression microdiscectomy  10/2001    L5-S1/E-chart  . Knee arthroscopy Right   . Thyroidectomy  1970's  . Total knee arthroplasty Left 11/2003  . X-stop implantation  12/2004    L3-4; L4-5  . Fixation kyphoplasty thoracic spine  07/2007  T3, 4, 6 compression fractures  . Shoulder open rotator cuff repair Right 04/2008  . Lumbar laminectomy/decompression microdiscectomy  01/2010  . Colectomy  03/2010    sigmoid left; transverse  . Peripherally inserted central catheter insertion  03/2010    removed upon discharge  . Colostomy reversal  12/2010  . Tonsillectomy and adenoidectomy  1969  . Appendectomy  03/2010  . Glaucoma surgery Right   . Cardiac defibrillator removal  12/17/2012    removed for infection  . Icd lead removal Left 12/17/2012    Procedure: ICD LEAD REMOVAL;  Surgeon: Evans Lance, MD;  Location: [place];  Service: Cardiovascular;  Laterality: Left;  . Coronary angioplasty with stent placement  10/2001; 09/2002; ?date    "I've got a total of 4" (04/10/2013)  .  Bi-ventricular implantable cardioverter defibrillator  (crt-d)  08/05/2013    STJ Quadra Assura CRTD implanted by Dr Caryl Comes (right sided)  . Esophagogastroduodenoscopy N/A 10/02/2013    Procedure: ESOPHAGOGASTRODUODENOSCOPY (EGD);  Surgeon: Lear Ng, MD;  Location: [facility] ENDOSCOPY;  Service: Endoscopy;  Laterality: N/A;  . Ablation of dysrhythmic focus  11/20/2013    [facility]  . Av node ablation  02/05/2014    by Dr. Lovena Le  . Left heart catheterization with coronary angiogram N/A 02/17/2011    Procedure: LEFT HEART CATHETERIZATION WITH CORONARY ANGIOGRAM;  Surgeon: Peter M Martinique, MD;  Location: [facility] CATH LAB;  Service: Cardiovascular;  Laterality: N/A;  . Implantable cardioverter defibrillator revision N/A 06/07/2012    Procedure: IMPLANTABLE CARDIOVERTER DEFIBRILLATOR REVISION;  Surgeon: Thompson Grayer, MD;  Location: [facility] CATH LAB;  Service: Cardiovascular;  Laterality: N/A;  . Right heart catheterization N/A 06/06/2013    Procedure: RIGHT HEART CATH;  Surgeon: Larey Dresser, MD;  Location: [facility] CATH LAB;  Service: Cardiovascular;  Laterality: N/A;  . Left heart catheterization with coronary angiogram Bilateral 07/06/2013    Procedure: LEFT HEART CATHETERIZATION WITH CORONARY ANGIOGRAM;  Surgeon: Burnell Blanks, MD;  Location: [facility] CATH LAB;  Service: Cardiovascular;  Laterality: Bilateral;  . Bi-ventricular implantable cardioverter defibrillator N/A 08/05/2013    Procedure: BI-VENTRICULAR IMPLANTABLE CARDIOVERTER DEFIBRILLATOR  (CRT-D);  Surgeon: Deboraha Sprang, MD;  Location: [facility] CATH LAB;  Service: Cardiovascular;  Laterality: N/A;  . Av node ablation N/A 02/05/2014    Procedure: AV NODE ABLATION;  Surgeon: Evans Lance, MD;  Location: [facility] CATH LAB;  Service: Cardiovascular;  Laterality: N/A;   Family History  Problem Relation Age of Onset  . Diabetes Mother   . Diabetes Brother   . Arthritis      family history  . Prostate cancer      Family History   History  Substance  Use Topics  . Smoking status: Former Smoker -- 0.10 packs/day for 15 years    Types: Cigarettes    Quit date: 04/21/2001  . Smokeless tobacco: Never Used     Comment: "smoked ~ 1 pack/month"  . Alcohol Use: 0.6 oz/week    1 Glasses of wine per week   OB History    No data available     Review of Systems  Constitutional: Negative for fever and chills.  HENT: Negative for sore throat.   Eyes: Negative for visual disturbance.  Respiratory: Negative for cough and shortness of breath.   Cardiovascular: Negative for chest pain and leg swelling.  Gastrointestinal: Negative for nausea, vomiting and diarrhea.  Genitourinary: Negative for dysuria.  Musculoskeletal: Negative for myalgias.  Skin: Negative for rash.  Neurological: Negative for  weakness, numbness and headaches.      Allergies  Coconut oil; Lansoprazole; Penicillins; Statins; Lisinopril; and Lactose intolerance (gi)  Home Medications   Prior to Admission medications   Medication Sig Start Date End Date Taking? Authorizing Provider  acetaminophen (TYLENOL) 325 MG tablet Take 2 tablets (650 mg total) by mouth every 4 (four) hours as needed for headache or mild pain. 02/07/14   Isaiah Serge, NP  amiodarone (PACERONE) 200 MG tablet Take 1 tablet (200 mg total) by mouth 2 (two) times daily. 01/19/14   Eileen Stanford, PA-C  apixaban (ELIQUIS) 5 MG TABS tablet Take 1 tablet (5 mg total) by mouth 2 (two) times daily. 09/02/13   Jolaine Artist, MD  carvedilol (COREG) 3.125 MG tablet Take 1 tablet (3.125 mg total) by mouth 2 (two) times daily with a meal. 10/03/13   Dayna N Dunn, PA-C  clonazePAM (KLONOPIN) 0.5 MG tablet Take 1 tablet (0.5 mg total) by mouth 2 (two) times daily as needed for anxiety. 09/16/13   Janith Lima, MD  fexofenadine (ALLEGRA) 180 MG tablet Take 180 mg by mouth daily as needed for allergies.     Historical Provider, MD  fluticasone (FLONASE) 50 MCG/ACT nasal spray Place 2 sprays into both nostrils  daily as needed for allergies.     Historical Provider, MD  furosemide (LASIX) 40 MG tablet Take 1 tablet (40 mg total) by mouth every other day. 02/07/14   Isaiah Serge, NP  levothyroxine (SYNTHROID, LEVOTHROID) 200 MCG tablet Take 200 mcg by mouth daily before breakfast.     Historical Provider, MD  nitroGLYCERIN (NITROSTAT) 0.4 MG SL tablet Place 0.4 mg under the tongue every 5 (five) minutes as needed for chest pain.     Historical Provider, MD  omega-3 acid ethyl esters (LOVAZA) 1 G capsule Take 1 g by mouth daily.    Historical Provider, MD  ondansetron (ZOFRAN-ODT) 4 MG disintegrating tablet Take 4 mg by mouth 2 (two) times daily as needed for nausea or vomiting.    Historical Provider, MD  oxyCODONE-acetaminophen (PERCOCET/ROXICET) 5-325 MG per tablet  02/24/14   Historical Provider, MD  pantoprazole (PROTONIX) 40 MG tablet Take 1 tablet (40 mg total) by mouth 2 (two) times daily. 07/12/13   Janith Lima, MD  polyethylene glycol [facility] / Floria Raveling) packet Take 17 g by mouth daily as needed (for constipation).     Historical Provider, MD  raloxifene (EVISTA) 60 MG tablet Take 60 mg by mouth daily.    Historical Provider, MD  spironolactone (ALDACTONE) 25 MG tablet Take 0.5 tablets (12.5 mg total) by mouth daily. 02/07/14   Isaiah Serge, NP   BP 123/60 mmHg  Pulse 65  Temp(Src) 97.8 F (36.6 C) (Oral)  Resp 26  SpO2 100% Physical Exam  Constitutional: She appears well-developed and well-nourished. No distress.  HENT:  Head: Normocephalic and atraumatic.  Mouth/Throat: Oropharynx is clear and moist. No oropharyngeal exudate.  Eyes: Conjunctivae are normal.  Neck: Neck supple. No thyromegaly present.  Cardiovascular: Normal rate, regular rhythm and intact distal pulses.   With occasional runs of v-tach noted on monitor, pt remains asymptomatic  Pulmonary/Chest: Effort normal and breath sounds normal. No respiratory distress. She has no wheezes. She has no rales. She exhibits no  tenderness.  Abdominal: Soft. There is no tenderness.  Musculoskeletal: She exhibits no tenderness.  Lymphadenopathy:    She has no cervical adenopathy.  Neurological: She is alert.  Skin: Skin is warm and dry.  No rash noted. She is not diaphoretic.  Psychiatric: She has a normal mood and affect.  Nursing note and vitals reviewed.   ED Course  Procedures (including critical care time) Labs Review Labs Reviewed - No data to display  Imaging Review Dg Chest 2 View  03/06/2014   CLINICAL DATA:  Defibrillator activation this morning at 930. Chest pain after defibrillator fired but resolution of chest pain.  EXAM: CHEST  2 VIEW  COMPARISON:  02/05/2014.  FINDINGS: Cardiomegaly. AICD is present with coronary sinus lead. Multilevel vertebral augmentation. Small LEFT pleural effusion is present. Pulmonary vascular congestion is present which appears more prominent on the most recent comparison chest radiograph of 02/05/2014. Mediastinal contours appear similar. Aortic arch atherosclerosis.  Comparing to multiple priors, the appearance of the cardiopericardial silhouette appears similar.  IMPRESSION: 1. Cardiomegaly and pulmonary vascular congestion. 2. AICD leads appear unchanged. 3. Small LEFT pleural effusion suggesting volume overload.   Electronically Signed   By: Dereck Ligas M.D.   On: 03/06/2014 14:55     EKG Interpretation   Date/Time:  Thursday March 06 2014 13:12:50 EST Ventricular Rate:  67 PR Interval:    QRS Duration: 177 QT Interval:  516 QTC Calculation: 545 R Axis:   170 Text Interpretation:  VENTRICULAR PACED RHYTHM LOW VOLTAGE Abnormal ekg  since last tracing no significant change Confirmed by MILLER  MD, BRIAN  [phone]) on 03/06/2014 2:59:23 PM      MDM   Final diagnoses:  ICD (implantable cardioverter-defibrillator) discharge   63 yo with report of ICD firing once today.  She has no complaints or report of recent illness.   EKG, CXR, CBC, BMP, Troponin  ordered.  ICD interrogated with report of multiple runs of V-tach.  Case discussed with Dr. Sabra Heck.    Cardiology consulted regarding ICD discharge, will come to evaluate pt.    Nursing rpeorts difficulty with IV access and lab draw.  Bedside US used to place 20 ga PIV and blood drawn by Dr. Sabra Heck.    Cardiology at bedside to evaluate ICD.  Plan to admit for evaluation. The patient appears reasonably stabilized for admission considering the current resources, flow, and capabilities available in the ED at this time, and I doubt any further screening and/or treatment in the ED prior to admission.     Filed Vitals:   03/06/14 1309 03/06/14 1315 03/06/14 1316  BP:   123/60  Pulse:   65  Temp:   97.8 F (36.6 C)  TempSrc:   Oral  Resp:   26  SpO2: 100% 98% 100%   Meds given in ED:  Medications - No data to display  New Prescriptions   No medications on file       Britt Bottom, NP 03/07/14 2020  Johnna Acosta, MD 03/08/14 1754

## 2014-03-06 NOTE — Consult Note (Addendum)
ELECTROPHYSIOLOGY HISTORY AND PHYSICAL     Patient ID: [name] MRN: [id number], DOB/AGE: 64-Dec-1952 64 y.o.  Admit date: 03/06/2014  Primary Physician: Becky Calico, MD Primary Cardiologist: [name] Heart Failure: McLean/Bensimhon Electrophysiologist: Caryl Comes  CC: ICD shock  HPI:  [name] is a 63 y.o. female with a past medical history of ICM (s/p ICD implantation in 2005; gen change 2006; dual chamber upgrade 2014 with subsequent extraction due to infection 11/2012, CRT-D implant 04/8364), chronic systolic heart failure, CAD last intervention 2004, last cath 06/2013 (patent the LAD and circumflex stents), myoview 04/2013 with no ischemia), prior CVA, chronic kidney disease and atrial arrhythmias.  She originally underwent ICD implantation in 2005, gen change in 2006, upgrade to dual chamber ICD in 2014 with subsequent infection and extraction. Now s/p CRTD implant May 2015. She has a history of previous inappropriate shocks for atrial arrhythmias. She underwent afib ablation by Dr Ola Spurr earlier this year and had ERAF with inappropriate ICD therapies and underwent AVN ablation 02-05-14.    She has been doing well recently with the exception of increased back pain. She has not had any chest pain, shortness of breath, LE edema, recent fevers, chills, nausea or vomiting.    Last night she was in her usual state of health, got up this morning and was slightly short of breath when she received an ICD shock.  She had no palpitations, chest pain or other symptoms.   Past Medical History  Diagnosis Date  . Spinal stenosis   . Numbness of foot   . Diverticulitis     a. s/p partial colectomy 1/12 with reversal in July 2012.  . Colitis, ischemic   . DJD (degenerative joint disease)   . Ischemic cardiomyopathy     a. Echo 06/16/10: EF 25-30%, anteroseptal and apical hypokinesis, moderate AI, mild MR.   . LV (left ventricular) mural thrombus   . CKD (chronic kidney disease), stage  III   . GERD (gastroesophageal reflux disease)   . Hypertension   . Hypothyroidism   . Hyperlipidemia   . Compression fracture     a. of L2  . 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 2/2 atrial fibrillation with RVR in the context of hypokalemia  . PAF (paroxysmal atrial fibrillation)     a. on Eliquis, digoxin, and amiodarone  . Anemia   . H/O hiatal hernia   . Sinus bradycardia   . Mitral regurgitation   . Chronic systolic CHF (congestive heart failure)   . CAD (coronary artery disease)     a.  Ant MI 8/03 with stenting to LAD;  b. staged PCI w/ DES to OM1 8/03;  c. s/p DES to LAD 7/04;  d.  multiple caths in past (chronically abnl ECG); e. cardiac cath 3/12: LAD stent patent, distal LAD 40-50%, small ostial D1 80%, ostial D2 60%, OM-1 stent patent (20-30%), proximal RCA 50%, prox- mRCA 40-50%; f. cath 2012 nonobs g. cath 06/2013 and 08/2013 for "STEMI" - mild nonobst CAD only.  [name] History of TIA (transient ischemic attack)   . 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 s/p ICD implantation in 2005. b. Gen change 2006. 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 c. s/p CRT-D implantation 07/2013.  [name] Paroxysmal atrial tachycardia   . Paroxysmal atrial flutter   . Stroke   . Osteoporosis   . Oral thrush     a.  By EGD 09/2013.  [name] Antral gastritis     a. By EGD 09/2013 - minimal.  . Ventricular tachycardia     a. 01/16/14 s/p amiodarone loading and ICD reprogramming  . S/P AV nodal ablation, 02/05/14 02/07/2014     Surgical History:  Past Surgical History  Procedure Laterality Date  . Back surgery    . Cardiac defibrillator placement  07/2003; 10/2004; 2014  . Lumbar laminectomy/decompression microdiscectomy  10/2001    L5-S1/E-chart  . Knee arthroscopy Right   . Thyroidectomy  1970's  . Total knee arthroplasty Left 11/2003  . X-stop implantation  12/2004    L3-4; L4-5    . Fixation kyphoplasty thoracic spine  07/2007    T3, 4, 6 compression fractures  . Shoulder open rotator cuff repair Right 04/2008  . Lumbar laminectomy/decompression microdiscectomy  01/2010  . Colectomy  03/2010    sigmoid left; transverse  . Peripherally inserted central catheter insertion  03/2010    removed upon discharge  . Colostomy reversal  12/2010  . Tonsillectomy and adenoidectomy  1969  . Appendectomy  03/2010  . Glaucoma surgery Right   . Cardiac defibrillator removal  12/17/2012    removed for infection  . Icd lead removal Left 12/17/2012    Procedure: ICD LEAD REMOVAL;  Surgeon: Evans Lance, MD;  Location: [place];  Service: Cardiovascular;  Laterality: Left;  . Coronary angioplasty with stent placement  10/2001; 09/2002; ?date    "I've got a total of 4" (04/10/2013)  . Bi-ventricular implantable cardioverter defibrillator  (crt-d)  08/05/2013    STJ Quadra Assura CRTD implanted by Dr Caryl Comes (right sided)  . Esophagogastroduodenoscopy N/A 10/02/2013    Procedure: ESOPHAGOGASTRODUODENOSCOPY (EGD);  Surgeon: Lear Ng, MD;  Location: [facility] ENDOSCOPY;  Service: Endoscopy;  Laterality: N/A;  . Ablation of dysrhythmic focus  11/20/2013    [facility]  . Av node ablation  02/05/2014    by Dr. Lovena Le  . Left heart catheterization with coronary angiogram N/A 02/17/2011    Procedure: LEFT HEART CATHETERIZATION WITH CORONARY ANGIOGRAM;  Surgeon: Peter M Martinique, MD;  Location: [facility] CATH LAB;  Service: Cardiovascular;  Laterality: N/A;  . Implantable cardioverter defibrillator revision N/A 06/07/2012    Procedure: IMPLANTABLE CARDIOVERTER DEFIBRILLATOR REVISION;  Surgeon: Thompson Grayer, MD;  Location: [facility] CATH LAB;  Service: Cardiovascular;  Laterality: N/A;  . Right heart catheterization N/A 06/06/2013    Procedure: RIGHT HEART CATH;  Surgeon: Larey Dresser, MD;  Location: [facility] CATH LAB;  Service: Cardiovascular;  Laterality: N/A;  . Left heart catheterization with coronary angiogram  Bilateral 07/06/2013    Procedure: LEFT HEART CATHETERIZATION WITH CORONARY ANGIOGRAM;  Surgeon: Burnell Blanks, MD;  Location: [facility] CATH LAB;  Service: Cardiovascular;  Laterality: Bilateral;  . Bi-ventricular implantable cardioverter defibrillator N/A 08/05/2013    Procedure: BI-VENTRICULAR IMPLANTABLE CARDIOVERTER DEFIBRILLATOR  (CRT-D);  Surgeon: Deboraha Sprang, MD;  Location: [facility] CATH LAB;  Service: Cardiovascular;  Laterality: N/A;  . Av node ablation N/A 02/05/2014    Procedure: AV NODE ABLATION;  Surgeon: Evans Lance, MD;  Location: [facility] CATH LAB;  Service: Cardiovascular;  Laterality: N/A;     Prior to Admission medications   Medication Sig Start Date End Date Taking? Authorizing Provider  amiodarone (PACERONE) 200 MG tablet Take 1 tablet (200 mg total) by mouth 2 (two) times daily. 01/19/14  Yes Eileen Stanford, PA-C  apixaban (ELIQUIS) 5 MG TABS tablet Take 1 tablet (5 mg total) by mouth 2 (two) times  daily. 09/02/13  Yes Jolaine Artist, MD  carvedilol (COREG) 3.125 MG tablet Take 1 tablet (3.125 mg total) by mouth 2 (two) times daily with a meal. 10/03/13  Yes Dayna N Dunn, PA-C  fluticasone (FLONASE) 50 MCG/ACT nasal spray Place 2 sprays into both nostrils daily as needed for allergies.    Yes Historical Provider, MD  furosemide (LASIX) 40 MG tablet Take 1 tablet (40 mg total) by mouth every other day. 02/07/14  Yes Isaiah Serge, NP  levothyroxine (SYNTHROID, LEVOTHROID) 200 MCG tablet Take 200 mcg by mouth daily before breakfast.    Yes Historical Provider, MD  nitroGLYCERIN (NITROSTAT) 0.4 MG SL tablet Place 0.4 mg under the tongue every 5 (five) minutes as needed for chest pain.    Yes Historical Provider, MD  omega-3 acid ethyl esters (LOVAZA) 1 G capsule Take 1 g by mouth daily.   Yes Historical Provider, MD  ondansetron (ZOFRAN-ODT) 4 MG disintegrating tablet Take 4 mg by mouth 2 (two) times daily as needed for nausea or vomiting.   Yes Historical Provider, MD    oxyCODONE-acetaminophen (PERCOCET/ROXICET) 5-325 MG per tablet Take 1 tablet by mouth every 6 (six) hours as needed for moderate pain.  02/24/14  Yes Historical Provider, MD  pantoprazole (PROTONIX) 40 MG tablet Take 1 tablet (40 mg total) by mouth 2 (two) times daily. 07/12/13  Yes Janith Lima, MD  polyethylene glycol [facility] / GLYCOLAX) packet Take 17 g by mouth daily as needed (for constipation).    Yes Historical Provider, MD  raloxifene (EVISTA) 60 MG tablet Take 60 mg by mouth daily.   Yes Historical Provider, MD  spironolactone (ALDACTONE) 25 MG tablet Take 0.5 tablets (12.5 mg total) by mouth daily. 02/07/14  Yes Isaiah Serge, NP  acetaminophen (TYLENOL) 325 MG tablet Take 2 tablets (650 mg total) by mouth every 4 (four) hours as needed for headache or mild pain. Patient not taking: Reported on 03/06/2014 02/07/14   Isaiah Serge, NP  clonazePAM (KLONOPIN) 0.5 MG tablet Take 1 tablet (0.5 mg total) by mouth 2 (two) times daily as needed for anxiety. Patient not taking: Reported on 03/06/2014 09/16/13   Janith Lima, MD    Allergies:  Allergies  Allergen Reactions  . Coconut Oil Anaphylaxis and Hives  . Lansoprazole Nausea Only  . Penicillins Swelling    Throat swells  . Statins Other (See Comments)    cramps  . Lisinopril     Cough- but still tolerates it  . Lactose Intolerance (Gi) Diarrhea and Other (See Comments)    Patient reports abdominal cramping and diarrhea with lactose products.     History   Social History  . Marital Status: Widowed    Spouse Name: N/A    Number of Children: N/A  . Years of Education: N/A   Occupational History  . Retired    Social History Main Topics  . Smoking status: Former Smoker -- 0.10 packs/day for 15 years    Types: Cigarettes    Quit date: 04/21/2001  . Smokeless tobacco: Never Used     Comment: "smoked ~ 1 pack/month"  . Alcohol Use: 0.6 oz/week    1 Glasses of wine per week  . Drug Use: No  . Sexual Activity: Not  Currently   Other Topics Concern  . Not on file   Social History Narrative   Lives alone, has a house, has some household help. Handicapped upfitted.     Family History  Problem Relation  Age of Onset  . Diabetes Mother   . Diabetes Brother   . Arthritis      family history  . Prostate cancer      Family History    BP 120/65 mmHg  Pulse 107  Temp(Src) 97.8 F (36.6 C) (Oral)  Resp 13  SpO2 100% Physical Exam: Filed Vitals:   03/06/14 1400 03/06/14 1445 03/06/14 1500 03/06/14 1515  BP:  125/62 128/56 120/65  Pulse: 130 69 65 107  Temp:      TempSrc:      Resp: 21 17 14 13   SpO2: 100% 100% 100% 100%    GEN- The patient is chronically ill appearing, alert and oriented x 3 today.   Head- normocephalic, atraumatic Eyes-  Sclera clear, conjunctiva pink Ears- hearing intact Oropharynx- clear Neck- supple Lungs- Clear to ausculation bilaterally, normal work of breathing Heart- Regular rate and rhythm, no murmurs, rubs or gallops, laterally displaced PMI GI- soft, NT, ND, + BS Extremities- no clubbing, cyanosis, or edema  MS- no significant deformity or atrophy Skin- R sided ICD pocket is well healed Psych- euthymic mood, full affect Neuro- strength and sensation are intact    Labs:   Lab Results  Component Value Date   WBC 3.5* 02/06/2014   HGB 11.7* 02/06/2014   HCT 34.9* 02/06/2014   MCV 99.4 02/06/2014   PLT 144* 02/06/2014   No results for input(s): NA, K, CL, CO2, BUN, CREATININE, CALCIUM, PROT, BILITOT, ALKPHOS, ALT, AST, GLUCOSE in the last 168 hours.  Invalid input(s): LABALBU  Radiology/Studies: Dg Chest 2 View 03/06/2014   CLINICAL DATA:  Defibrillator activation this morning at 930. Chest pain after defibrillator fired but resolution of chest pain.  EXAM: CHEST  2 VIEW  COMPARISON:  02/05/2014.  FINDINGS: Cardiomegaly. AICD is present with coronary sinus lead. Multilevel vertebral augmentation. Small LEFT pleural effusion is present. Pulmonary  vascular congestion is present which appears more prominent on the most recent comparison chest radiograph of 02/05/2014. Mediastinal contours appear similar. Aortic arch atherosclerosis.  Comparing to multiple priors, the appearance of the cardiopericardial silhouette appears similar.  IMPRESSION: 1. Cardiomegaly and pulmonary vascular congestion. 2. AICD leads appear unchanged. 3. Small LEFT pleural effusion suggesting volume overload.   Electronically Signed   By: Dereck Ligas M.D.   On: 03/06/2014 14:55   EKG: AV paced  TELEMETRY: sinus rhythm with ventricular pacing  Device Interrogated - full report in paper chart.  Device reprogrammed from DDDR to DDIR, ATP made more aggressive (number of stimuli increased from 8 to 12, burst cycle length also made more aggressive)  A/P: 1.  VT The patient presents with minimally symptomatic VT with CL 344 msec.  ATP therapy was not successful and she received ICD shock therapy which successfully terminated VT.  She is already on amiodarone 200mg  daily.  She is also on low dose coreg. Today, I have reprogrammed ATP therapies to a more aggressive %CL and also increased bursts from 8 to 12.  EP team has spoken with Dr Caryl Comes (primary EP) who recommends initiation of mexiletine at this time.  Will also continue to titrate coreg as able.  Admit for overnight observation given prolonged and refractory VT on presentation.  2. Afib/ atrial flutter S/p AV nodal ablation Continue eliquis for chads2vasc score of at least 7. I have reprogrammed from [place] to DDDR today due to SIR response and AV prolongation.  3. HTN Stable No change required today Follow on coreg   4. CAD/ ischemic  CM/ chronic systolic dysfunction Small effusion on CXR but clinically euvolemic.  Coreview is mildly elevated. Will resume home medicines and follow My suspicion for acute ischemic as the cause for her VT is low.  Would anticipate mildly elevated troponin given refractory VT and  shock.  No ischemic workup is planned at this time.  If stable overnight, would anticipate discharge in am.

## 2014-03-06 NOTE — ED Notes (Signed)
Looked for an IV with ultrasound, unable to find vein RN felt comfortable sticking. NP notified. To place IV team consult.

## 2014-03-06 NOTE — ED Provider Notes (Signed)
63 year old female, being seen in the hospital today because of her defibrillator firing this morning. According to her pacemaker company she has had multiple episodes of a tachycardia requiring pacing and one episode requiring defibrillation. The patient has no focal findings on exam, she does have a soft murmur, lungs are clear, no peripheral edema, otherwise patient appears comfortable, she will need cardiology consultation given this history, disposition pending cardiology consultation..  Medical screening examination/treatment/procedure(s) were conducted as a shared visit with non-physician practitioner(s) and myself.  I personally evaluated the patient during the encounter.  Clinical Impression:   Final diagnoses:  ICD (implantable cardioverter-defibrillator) discharge         Johnna Acosta, MD 03/08/14 1754

## 2014-03-06 NOTE — ED Notes (Signed)
Patient transported to X-ray 

## 2014-03-06 NOTE — ED Notes (Signed)
NP Benjamine Mola at bedside.

## 2014-03-06 NOTE — ED Notes (Signed)
Gave pt a ginger ale and [place] sandwich.

## 2014-03-06 NOTE — ED Notes (Signed)
RT called for arterial stick for labs per NP as pt is hard stick.

## 2014-03-06 NOTE — ED Notes (Signed)
Pt from home. Per EMS, pt was napping, got up to go to the bathroom when her defibrillator fired. Pt c/o of CP after defib fired. Denies radiation, SOB, N/V, dizziness. Pt hx of CHF, AFib, MI. NAD noted. VSS. Pt denies any CP at this time.

## 2014-03-07 DIAGNOSIS — M4800 Spinal stenosis, site unspecified: Secondary | ICD-10-CM | POA: Diagnosis not present

## 2014-03-07 DIAGNOSIS — I5022 Chronic systolic (congestive) heart failure: Secondary | ICD-10-CM

## 2014-03-07 DIAGNOSIS — T82198A Other mechanical complication of other cardiac electronic device, initial encounter: Secondary | ICD-10-CM | POA: Diagnosis not present

## 2014-03-07 DIAGNOSIS — I255 Ischemic cardiomyopathy: Secondary | ICD-10-CM | POA: Insufficient documentation

## 2014-03-07 DIAGNOSIS — K559 Vascular disorder of intestine, unspecified: Secondary | ICD-10-CM | POA: Diagnosis not present

## 2014-03-07 DIAGNOSIS — K5792 Diverticulitis of intestine, part unspecified, without perforation or abscess without bleeding: Secondary | ICD-10-CM | POA: Diagnosis not present

## 2014-03-07 LAB — BASIC METABOLIC PANEL
ANION GAP: 15 (ref 5–15)
BUN: 15 mg/dL (ref 6–23)
CALCIUM: 8.7 mg/dL (ref 8.4–10.5)
CHLORIDE: 96 meq/L (ref 96–112)
CO2: 24 meq/L (ref 19–32)
Creatinine, Ser: 1.06 mg/dL (ref 0.50–1.10)
GFR calc Af Amer: 63 mL/min — ABNORMAL LOW (ref >90)
GFR calc non Af Amer: 54 mL/min — ABNORMAL LOW (ref >90)
GLUCOSE: 70 mg/dL (ref 70–99)
Potassium: 3.8 mEq/L (ref 3.7–5.3)
SODIUM: 135 meq/L — AB (ref 137–147)

## 2014-03-07 LAB — CBC
HCT: 36.2 % (ref 36.0–46.0)
HEMOGLOBIN: 12.3 g/dL (ref 12.0–15.0)
MCH: 33.7 pg (ref 26.0–34.0)
MCHC: 34 g/dL (ref 30.0–36.0)
MCV: 99.2 fL (ref 78.0–100.0)
Platelets: 158 10*3/uL (ref 150–400)
RBC: 3.65 MIL/uL — AB (ref 3.87–5.11)
RDW: 17.6 % — AB (ref 11.5–15.5)
WBC: 4 10*3/uL (ref 4.0–10.5)

## 2014-03-07 LAB — TSH: TSH: 9.01 u[IU]/mL — AB (ref 0.350–4.500)

## 2014-03-07 MED ORDER — MEXILETINE HCL 200 MG PO CAPS: 200.0000 mg | ORAL_CAPSULE | Freq: Two times a day (BID) | ORAL | Status: AC

## 2014-03-07 MED ORDER — CARVEDILOL 3.125 MG PO TABS: 6.2500 mg | ORAL_TABLET | Freq: Two times a day (BID) | ORAL | Status: AC

## 2014-03-07 MED ORDER — AMIODARONE HCL 200 MG PO TABS: 200.0000 mg | ORAL_TABLET | Freq: Every day | ORAL | Status: AC

## 2014-03-07 NOTE — Discharge Summary (Signed)
ELECTROPHYSIOLOGY PROCEDURE DISCHARGE SUMMARY    Patient ID: [name],  MRN: [id number], DOB/AGE: 1950/09/26 63 y.o.  Admit date: 03/06/2014 Discharge date: 03/07/2014  Primary Care Physician: [name], MD Primary Cardiologist: Tamala Julian Heart Failure: McLean/Bensimhon Electrophysiologist: Caryl Comes  Primary Discharge Diagnosis:  Ventricular tachycardia with appropriate ICD therapy  Secondary Discharge Diagnosis:  1.  ICM 2.  Chronic systolic heart failure 3.  CAD 4. Prior CVA 5.  CKD 6.  Atrial fibrillation s/p PVI   Allergies  Allergen Reactions  . Coconut Oil Anaphylaxis and Hives  . Lansoprazole Nausea Only  . Penicillins Swelling    Throat swells  . Statins Other (See Comments)    cramps  . Lisinopril     Cough- but still tolerates it  . Lactose Intolerance (Gi) Diarrhea and Other (See Comments)    Patient reports abdominal cramping and diarrhea with lactose products.     Brief HPI/Hospital Course:  [name] is a 63 y.o. female with a past medical history of ICM (s/p ICD implantation in 2005; gen change 2006; dual chamber upgrade 2014 with subsequent extraction due to infection 11/2012, CRT-D implant 0/9983), chronic systolic heart failure, CAD last intervention 2004, last cath 06/2013 (patent the LAD and circumflex stents), myoview 04/2013 with no ischemia), prior CVA, chronic kidney disease and atrial arrhythmias.  She originally underwent ICD implantation in 2005, gen change in 2006, upgrade to dual chamber ICD in 2014 with subsequent infection and extraction. Now s/p CRTD implant May 2015. She has a history of previous inappropriate shocks for atrial arrhythmias. She underwent afib ablation by Dr Ola Spurr earlier this year and had ERAF with inappropriate ICD therapies and underwent AVN ablation 02-05-14.   On the day of admission, she got up out of bed and received an ICD shock.  Her ICD was reprogrammed and Mexiletine was added after discussion  with her primary EP, Dr Caryl Comes.  She maintained SR overnight with no further ventricular arrhythmias.  She was evaluated by Dr Rayann Heman and considered stable for discharge to home.   Her TSH was elevated this admission.  Compliance with Synthroid was encouraged.  The patient has been asked to follow up with her primary care physician.    No driving for 6 months - patient aware.  She will follow up with Dr Caryl Comes on 12-22 as scheduled.    Discharge Vitals: Blood pressure 111/62, pulse 66, temperature 98.3 F (36.8 C), temperature source Oral, resp. rate 18, height 5\' 4"  (1.626 m), weight 129 lb 11.2 oz (58.832 kg), SpO2 98 %.   GEN- The patient is chronically ill appearing, alert and oriented x 3 today.  Head- normocephalic, atraumatic Eyes- Sclera clear, conjunctiva pink Ears- hearing intact Oropharynx- clear Neck- supple Lungs- Clear to ausculation bilaterally, normal work of breathing Heart- Regular rate and rhythm, no murmurs, rubs or gallops, laterally displaced PMI GI- soft, NT, ND, + BS Extremities- no clubbing, cyanosis, or edema  MS- no significant deformity or atrophy Skin- R sided ICD pocket is well healed Psych- euthymic mood, full affect Neuro- strength and sensation are intact Labs:   Lab Results  Component Value Date   WBC 4.0 03/07/2014   HGB 12.3 03/07/2014   HCT 36.2 03/07/2014   MCV 99.2 03/07/2014   PLT 158 03/07/2014     Recent Labs Lab 03/07/14 0403  NA 135*  K 3.8  CL 96  CO2 24  BUN 15  CREATININE 1.06  CALCIUM 8.7  GLUCOSE 70  Discharge Medications:    Medication List    TAKE these medications        acetaminophen 325 MG tablet  Commonly known as:  TYLENOL  Take 2 tablets (650 mg total) by mouth every 4 (four) hours as needed for headache or mild pain.     amiodarone 200 MG tablet  Commonly known as:  PACERONE  Take 1 tablet (200 mg total) by mouth daily.     apixaban 5 MG Tabs tablet  Commonly known as:  ELIQUIS  Take 1 tablet  (5 mg total) by mouth 2 (two) times daily.     carvedilol 3.125 MG tablet  Commonly known as:  COREG  Take 2 tablets (6.25 mg total) by mouth 2 (two) times daily with a meal.     clonazePAM 0.5 MG tablet  Commonly known as:  KLONOPIN  Take 1 tablet (0.5 mg total) by mouth 2 (two) times daily as needed for anxiety.     fluticasone 50 MCG/ACT nasal spray  Commonly known as:  FLONASE  Place 2 sprays into both nostrils daily as needed for allergies.     furosemide 40 MG tablet  Commonly known as:  LASIX  Take 1 tablet (40 mg total) by mouth every other day.     levothyroxine 200 MCG tablet  Commonly known as:  SYNTHROID, LEVOTHROID  Take 200 mcg by mouth daily before breakfast.     mexiletine 200 MG capsule  Commonly known as:  MEXITIL  Take 1 capsule (200 mg total) by mouth every 12 (twelve) hours.     nitroGLYCERIN 0.4 MG SL tablet  Commonly known as:  NITROSTAT  Place 0.4 mg under the tongue every 5 (five) minutes as needed for chest pain.     omega-3 acid ethyl esters 1 G capsule  Commonly known as:  LOVAZA  Take 1 g by mouth daily.     ondansetron 4 MG disintegrating tablet  Commonly known as:  ZOFRAN-ODT  Take 4 mg by mouth 2 (two) times daily as needed for nausea or vomiting.     oxyCODONE-acetaminophen 5-325 MG per tablet  Commonly known as:  PERCOCET/ROXICET  Take 1 tablet by mouth every 6 (six) hours as needed for moderate pain.     pantoprazole 40 MG tablet  Commonly known as:  PROTONIX  Take 1 tablet (40 mg total) by mouth 2 (two) times daily.     polyethylene glycol packet  Commonly known as:  MIRALAX / GLYCOLAX  Take 17 g by mouth daily as needed (for constipation).     raloxifene 60 MG tablet  Commonly known as:  EVISTA  Take 60 mg by mouth daily.     spironolactone 25 MG tablet  Commonly known as:  ALDACTONE  Take 0.5 tablets (12.5 mg total) by mouth daily.         Duration of Discharge Encounter: Greater than 30 minutes including physician  time.  Signed,  Thompson Grayer MD

## 2014-03-07 NOTE — Discharge Instructions (Signed)
No driving x6 months °

## 2014-03-07 NOTE — Progress Notes (Signed)
UR completed 

## 2014-03-11 ENCOUNTER — Encounter: Payer: Self-pay | Admitting: Internal Medicine

## 2014-03-11 ENCOUNTER — Ambulatory Visit (INDEPENDENT_AMBULATORY_CARE_PROVIDER_SITE_OTHER): Payer: Medicare Other | Admitting: Internal Medicine

## 2014-03-11 VITALS — BP 120/64 | HR 78 | Ht 64.0 in | Wt 129.6 lb

## 2014-03-11 DIAGNOSIS — I2510 Atherosclerotic heart disease of native coronary artery without angina pectoris: Secondary | ICD-10-CM

## 2014-03-11 DIAGNOSIS — Z4502 Encounter for adjustment and management of automatic implantable cardiac defibrillator: Secondary | ICD-10-CM

## 2014-03-11 DIAGNOSIS — I255 Ischemic cardiomyopathy: Secondary | ICD-10-CM

## 2014-03-11 LAB — MDC_IDC_ENUM_SESS_TYPE_INCLINIC
Battery Remaining Longevity: 62.4 mo
Date Time Interrogation Session: 20151222152701
HIGH POWER IMPEDANCE MEASURED VALUE: 67.5 Ohm
Implantable Pulse Generator Serial Number: 7184288
Lead Channel Impedance Value: 375 Ohm
Lead Channel Impedance Value: 487.5 Ohm
Lead Channel Pacing Threshold Amplitude: 1 V
Lead Channel Pacing Threshold Pulse Width: 0.5 ms
Lead Channel Pacing Threshold Pulse Width: 0.5 ms
Lead Channel Pacing Threshold Pulse Width: 0.5 ms
Lead Channel Pacing Threshold Pulse Width: 0.5 ms
Lead Channel Sensing Intrinsic Amplitude: 4.5 mV
Lead Channel Setting Pacing Amplitude: 2 V
Lead Channel Setting Pacing Amplitude: 2.25 V
Lead Channel Setting Pacing Pulse Width: 0.5 ms
Lead Channel Setting Pacing Pulse Width: 0.5 ms
MDC IDC MSMT LEADCHNL LV IMPEDANCE VALUE: 362.5 Ohm
MDC IDC MSMT LEADCHNL LV PACING THRESHOLD AMPLITUDE: 1 V
MDC IDC MSMT LEADCHNL RA PACING THRESHOLD AMPLITUDE: 1.125 V
MDC IDC MSMT LEADCHNL RV PACING THRESHOLD AMPLITUDE: 1 V
MDC IDC MSMT LEADCHNL RV PACING THRESHOLD AMPLITUDE: 1 V
MDC IDC MSMT LEADCHNL RV PACING THRESHOLD PULSEWIDTH: 0.5 ms
MDC IDC SET LEADCHNL RV PACING AMPLITUDE: 2.5 V
MDC IDC SET LEADCHNL RV SENSING SENSITIVITY: 0.5 mV
MDC IDC SET ZONE DETECTION INTERVAL: 300 ms
MDC IDC SET ZONE DETECTION INTERVAL: 375 ms
MDC IDC STAT BRADY RA PERCENT PACED: 45 %
MDC IDC STAT BRADY RV PERCENT PACED: 99.34 %
Zone Setting Detection Interval: 250 ms

## 2014-03-11 NOTE — Patient Instructions (Signed)
Your physician recommends that you continue on your current medications as directed. Please refer to the Current Medication list given to you today.  Your physician recommends that you schedule a follow-up appointment in: 3 months with Dr. Klein.  

## 2014-03-11 NOTE — Progress Notes (Signed)
Patient Care Team: Janith Lima, MD as PCP - General Minus Breeding, MD (Cardiology)   HPI  [name] is a 63 y.o. female Seen in followup for an ICD implanted for primary prevention and for which she underwent device revision in 2006. She underwent repeat operation by Dr. Greggory Brandy at [facility] and presented with chronic smoldering infection in September 2014 She underwent device extraction September 2014 and underwent reimplantation 5/15.    She was hospitalized 3/15 with A/C CHF attributed in part to atrial tachy which was ultimately treated with amiodarone, after AV ablation/device implantation and primary ablation were considered,  ultimately she underwent AV junction ablation by Dr. Elliot Cousin fall 2015.  She was recently hospitalized again for ventricular tachycardia; she had ventricular tachycardia below her detection in October. She was hospitalized last weekend for ventricular tachycardia resulting in shock therapy after ATP failed. At that point mexiletine was added  To her amiodarone.  In  the past amio has been assoc with hepatotoxicity ; but with its discontinuation, the LFT abnormalities persisted      Past Medical History  Diagnosis Date  . Spinal stenosis   . Numbness of foot   . Diverticulitis     a. s/p partial colectomy 1/12 with reversal in July 2012.  . Colitis, ischemic   . Ischemic cardiomyopathy     a. Echo 06/16/10: EF 25-30%, anteroseptal and apical hypokinesis, moderate AI, mild MR.   . LV (left ventricular) mural thrombus   . CKD (chronic kidney disease), stage III   . GERD (gastroesophageal reflux disease)   . Hypertension   . Hypothyroidism   . Hyperlipidemia   . Compression fracture     a. of L2  . 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 2/2 atrial fibrillation with RVR in the context of hypokalemia  . PAF (paroxysmal atrial fibrillation)     a. on Eliquis, digoxin, and amiodarone  . Anemia   . H/O hiatal hernia     . Sinus bradycardia   . Mitral regurgitation   . Chronic systolic CHF (congestive heart failure)   . CAD (coronary artery disease)     a.  Ant MI 8/03 with stenting to LAD;  b. staged PCI w/ DES to OM1 8/03;  c. s/p DES to LAD 7/04;  d.  multiple caths in past (chronically abnl ECG); e. cardiac cath 3/12: LAD stent patent, distal LAD 40-50%, small ostial D1 80%, ostial D2 60%, OM-1 stent patent (20-30%), proximal RCA 50%, prox- mRCA 40-50%; f. cath 2012 nonobs g. cath 06/2013 and 08/2013 for "STEMI" - mild nonobst CAD only.  [name] History of TIA (transient ischemic attack)   . Paroxysmal atrial tachycardia   . Paroxysmal atrial flutter   . Osteoporosis   . Oral thrush     a. By EGD 09/2013.  [name] Antral gastritis     a. By EGD 09/2013 - minimal.  . Ventricular tachycardia     a. 01/16/14 s/p amiodarone loading and ICD reprogramming  . 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 s/p ICD implantation in 2005. b. Gen change 2006. 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 c. s/p CRT-D implantation 07/2013.  [name] Heart murmur   . Myocardial infarction 2000  . History of blood transfusion     "related to knee replacement and ?"  . Stroke     "I've had ~ 4; 3  in left eye and 1 w/ablation in [place]" ; denies residual on 03/04/2014(  . DJD (degenerative joint disease)   . Arthritis     "all over"    Past Surgical History  Procedure Laterality Date  . Cardiac defibrillator placement  07/2003; 10/2004; 2014  . Lumbar laminectomy/decompression microdiscectomy  10/2001    L5-S1/E-chart  . Knee arthroscopy Right   . Thyroidectomy  1970's  . Total knee arthroplasty Left 11/2003  . X-stop implantation  12/2004    L3-4; L4-5  . Fixation kyphoplasty thoracic spine  07/2007    T3, 4, 6 compression fractures  . Shoulder open rotator cuff repair Right 04/2008  . Lumbar laminectomy/decompression microdiscectomy  01/2010  . Peripherally  inserted central catheter insertion  03/2010    removed upon discharge  . Colostomy reversal  12/2010  . Tonsillectomy and adenoidectomy  1969  . Appendectomy  03/2010  . Glaucoma surgery Right   . Cardiac defibrillator removal  12/17/2012    removed for infection  . Icd lead removal Left 12/17/2012    Procedure: ICD LEAD REMOVAL;  Surgeon: Evans Lance, MD;  Location: [facility];  Service: Cardiovascular;  Laterality: Left;  . Bi-ventricular implantable cardioverter defibrillator  (crt-d)  08/05/2013    STJ Quadra Assura CRTD implanted by Dr Caryl Comes (right sided)  . Esophagogastroduodenoscopy N/A 10/02/2013    Procedure: ESOPHAGOGASTRODUODENOSCOPY (EGD);  Surgeon: Lear Ng, MD;  Location: [facility] ENDOSCOPY;  Service: Endoscopy;  Laterality: N/A;  . Ablation of dysrhythmic focus  11/20/2013    [facility]  . Left heart catheterization with coronary angiogram N/A 02/17/2011    Procedure: LEFT HEART CATHETERIZATION WITH CORONARY ANGIOGRAM;  Surgeon: Peter M Martinique, MD;  Location: [facility] CATH LAB;  Service: Cardiovascular;  Laterality: N/A;  . Implantable cardioverter defibrillator revision N/A 06/07/2012    Procedure: IMPLANTABLE CARDIOVERTER DEFIBRILLATOR REVISION;  Surgeon: Thompson Grayer, MD;  Location: [facility] CATH LAB;  Service: Cardiovascular;  Laterality: N/A;  . Right heart catheterization N/A 06/06/2013    Procedure: RIGHT HEART CATH;  Surgeon: Larey Dresser, MD;  Location: [facility] CATH LAB;  Service: Cardiovascular;  Laterality: N/A;  . Left heart catheterization with coronary angiogram Bilateral 07/06/2013    Procedure: LEFT HEART CATHETERIZATION WITH CORONARY ANGIOGRAM;  Surgeon: Burnell Blanks, MD;  Location: [facility] CATH LAB;  Service: Cardiovascular;  Laterality: Bilateral;  . Bi-ventricular implantable cardioverter defibrillator N/A 08/05/2013    Procedure: BI-VENTRICULAR IMPLANTABLE CARDIOVERTER DEFIBRILLATOR  (CRT-D);  Surgeon: Deboraha Sprang, MD;  Location: [facility] CATH LAB;  Service:  Cardiovascular;  Laterality: N/A;  . Av node ablation N/A 02/05/2014    Procedure: AV NODE ABLATION;  Surgeon: Evans Lance, MD;  Location: [facility] CATH LAB;  Service: Cardiovascular;  Laterality: N/A;  . Joint replacement    . Knee arthroscopy Right 1980's  . Back surgery  X 4  . Coronary angioplasty with stent placement  10/2001; 09/2002; ?date    "I've got a total of 4 or 5" (03/06/2014)  . Av node ablation  02/07/2014  . Colectomy  03/2010    sigmoid left; transverse    Current Outpatient Prescriptions  Medication Sig Dispense Refill  . acetaminophen (TYLENOL) 325 MG tablet Take 2 tablets (650 mg total) by mouth every 4 (four) hours as needed for headache or mild pain. (Patient not taking: Reported on 03/06/2014)    . amiodarone (PACERONE) 200 MG tablet Take 1 tablet (200 mg total) by mouth daily. 60 tablet 3  . apixaban (ELIQUIS) 5 MG  TABS tablet Take 1 tablet (5 mg total) by mouth 2 (two) times daily. 60 tablet 3  . carvedilol (COREG) 3.125 MG tablet Take 2 tablets (6.25 mg total) by mouth 2 (two) times daily with a meal. 60 tablet 6  . clonazePAM (KLONOPIN) 0.5 MG tablet Take 1 tablet (0.5 mg total) by mouth 2 (two) times daily as needed for anxiety. (Patient not taking: Reported on 03/06/2014) 60 tablet 5  . fluticasone (FLONASE) 50 MCG/ACT nasal spray Place 2 sprays into both nostrils daily as needed for allergies.     . furosemide (LASIX) 40 MG tablet Take 1 tablet (40 mg total) by mouth every other day. 30 tablet 6  . levothyroxine (SYNTHROID, LEVOTHROID) 200 MCG tablet Take 200 mcg by mouth daily before breakfast.     . mexiletine (MEXITIL) 200 MG capsule Take 1 capsule (200 mg total) by mouth every 12 (twelve) hours. 60 capsule 3  . nitroGLYCERIN (NITROSTAT) 0.4 MG SL tablet Place 0.4 mg under the tongue every 5 (five) minutes as needed for chest pain.     [name] omega-3 acid ethyl esters (LOVAZA) 1 G capsule Take 1 g by mouth daily.    . ondansetron (ZOFRAN-ODT) 4 MG disintegrating  tablet Take 4 mg by mouth 2 (two) times daily as needed for nausea or vomiting.    [name] oxyCODONE-acetaminophen (PERCOCET/ROXICET) 5-325 MG per tablet Take 1 tablet by mouth every 6 (six) hours as needed for moderate pain.     . pantoprazole (PROTONIX) 40 MG tablet Take 1 tablet (40 mg total) by mouth 2 (two) times daily. 180 tablet 1  . polyethylene glycol (MIRALAX / GLYCOLAX) packet Take 17 g by mouth daily as needed (for constipation).     . raloxifene (EVISTA) 60 MG tablet Take 60 mg by mouth daily.    [name] spironolactone (ALDACTONE) 25 MG tablet Take 0.5 tablets (12.5 mg total) by mouth daily.     No current facility-administered medications for this visit.    Allergies  Allergen Reactions  . Coconut Oil Anaphylaxis and Hives  . Lansoprazole Nausea Only  . Penicillins Swelling    Throat swells  . Statins Other (See Comments)    cramps  . Lisinopril     Cough- but still tolerates it  . Lactose Intolerance (Gi) Diarrhea and Other (See Comments)    Patient reports abdominal cramping and diarrhea with lactose products.     Review of Systems negative except from HPI and PMH  Physical Exam There were no vitals taken for this visit. Well developed and well nourished in no acute distress HENT normal E scleral and icterus clear Neck Supple JVP flat; carotids brisk and full Clear to ausculation  Regular rate and rhythm, no murmurs gallops or rub Soft with active bowel sounds No clubbing cyanosis  Edema Alert and oriented, grossly normal motor and sensory function Skin Warm and Dry  ECG NSR 59 17/09/45  Assessment and  Plan  Atrial Tach  Nonischemic Cardiomyopathy  CHF-chronic systolic  Ventricular Tachycardia  ICD St Jude   She is struggling with severe back pain   She was shocked the other day for VT with failed ATP   Her device was reprogrammed to make more aggressive the atp  We will try and mexilitene but she has not been able to get it and so if we cant will add  ranexa  We spent more than 50% of our >25 min visit in face to face counseling regarding the above

## 2014-03-18 ENCOUNTER — Telehealth: Payer: Self-pay | Admitting: *Deleted

## 2014-03-18 NOTE — Telephone Encounter (Signed)
Faxed [employer] paperwork.

## 2014-03-21 DIAGNOSIS — R2689 Other abnormalities of gait and mobility: Secondary | ICD-10-CM | POA: Diagnosis not present

## 2014-03-21 DIAGNOSIS — I255 Ischemic cardiomyopathy: Secondary | ICD-10-CM

## 2014-03-21 DIAGNOSIS — I480 Paroxysmal atrial fibrillation: Secondary | ICD-10-CM | POA: Diagnosis not present

## 2014-03-21 DIAGNOSIS — I5032 Chronic diastolic (congestive) heart failure: Secondary | ICD-10-CM | POA: Diagnosis not present

## 2014-03-21 DIAGNOSIS — I129 Hypertensive chronic kidney disease with stage 1 through stage 4 chronic kidney disease, or unspecified chronic kidney disease: Secondary | ICD-10-CM

## 2014-03-21 DIAGNOSIS — I4892 Unspecified atrial flutter: Secondary | ICD-10-CM | POA: Diagnosis not present

## 2014-03-27 ENCOUNTER — Ambulatory Visit (HOSPITAL_COMMUNITY): Payer: Medicare Other

## 2014-03-27 ENCOUNTER — Encounter (HOSPITAL_COMMUNITY): Payer: Self-pay | Admitting: Emergency Medicine

## 2014-03-27 ENCOUNTER — Inpatient Hospital Stay (HOSPITAL_COMMUNITY)
Admission: EM | Admit: 2014-03-27 | Discharge: 2014-04-21 | DRG: 286 | Disposition: E | Payer: Medicare Other | Source: Other Acute Inpatient Hospital | Attending: Internal Medicine | Admitting: Internal Medicine

## 2014-03-27 DIAGNOSIS — I472 Ventricular tachycardia, unspecified: Secondary | ICD-10-CM | POA: Insufficient documentation

## 2014-03-27 DIAGNOSIS — E872 Acidosis, unspecified: Secondary | ICD-10-CM

## 2014-03-27 DIAGNOSIS — R197 Diarrhea, unspecified: Secondary | ICD-10-CM | POA: Diagnosis present

## 2014-03-27 DIAGNOSIS — I4892 Unspecified atrial flutter: Secondary | ICD-10-CM | POA: Diagnosis present

## 2014-03-27 DIAGNOSIS — Z87891 Personal history of nicotine dependence: Secondary | ICD-10-CM

## 2014-03-27 DIAGNOSIS — R079 Chest pain, unspecified: Secondary | ICD-10-CM | POA: Diagnosis present

## 2014-03-27 DIAGNOSIS — R29810 Facial weakness: Secondary | ICD-10-CM | POA: Diagnosis present

## 2014-03-27 DIAGNOSIS — R29898 Other symptoms and signs involving the musculoskeletal system: Secondary | ICD-10-CM

## 2014-03-27 DIAGNOSIS — K3184 Gastroparesis: Secondary | ICD-10-CM

## 2014-03-27 DIAGNOSIS — Z66 Do not resuscitate: Secondary | ICD-10-CM | POA: Diagnosis not present

## 2014-03-27 DIAGNOSIS — I130 Hypertensive heart and chronic kidney disease with heart failure and stage 1 through stage 4 chronic kidney disease, or unspecified chronic kidney disease: Secondary | ICD-10-CM | POA: Diagnosis present

## 2014-03-27 DIAGNOSIS — E039 Hypothyroidism, unspecified: Secondary | ICD-10-CM | POA: Diagnosis present

## 2014-03-27 DIAGNOSIS — R112 Nausea with vomiting, unspecified: Secondary | ICD-10-CM

## 2014-03-27 DIAGNOSIS — Z7901 Long term (current) use of anticoagulants: Secondary | ICD-10-CM

## 2014-03-27 DIAGNOSIS — Z6820 Body mass index (BMI) 20.0-20.9, adult: Secondary | ICD-10-CM

## 2014-03-27 DIAGNOSIS — Z79899 Other long term (current) drug therapy: Secondary | ICD-10-CM

## 2014-03-27 DIAGNOSIS — I4819 Other persistent atrial fibrillation: Secondary | ICD-10-CM | POA: Diagnosis present

## 2014-03-27 DIAGNOSIS — K219 Gastro-esophageal reflux disease without esophagitis: Secondary | ICD-10-CM | POA: Diagnosis present

## 2014-03-27 DIAGNOSIS — Z833 Family history of diabetes mellitus: Secondary | ICD-10-CM

## 2014-03-27 DIAGNOSIS — T40605A Adverse effect of unspecified narcotics, initial encounter: Secondary | ICD-10-CM | POA: Diagnosis present

## 2014-03-27 DIAGNOSIS — Z95810 Presence of automatic (implantable) cardiac defibrillator: Secondary | ICD-10-CM | POA: Diagnosis present

## 2014-03-27 DIAGNOSIS — Z955 Presence of coronary angioplasty implant and graft: Secondary | ICD-10-CM

## 2014-03-27 DIAGNOSIS — N179 Acute kidney failure, unspecified: Secondary | ICD-10-CM | POA: Diagnosis present

## 2014-03-27 DIAGNOSIS — T462X5A Adverse effect of other antidysrhythmic drugs, initial encounter: Secondary | ICD-10-CM | POA: Diagnosis present

## 2014-03-27 DIAGNOSIS — E43 Unspecified severe protein-calorie malnutrition: Secondary | ICD-10-CM | POA: Insufficient documentation

## 2014-03-27 DIAGNOSIS — K5909 Other constipation: Secondary | ICD-10-CM | POA: Diagnosis present

## 2014-03-27 DIAGNOSIS — E875 Hyperkalemia: Secondary | ICD-10-CM

## 2014-03-27 DIAGNOSIS — Z515 Encounter for palliative care: Secondary | ICD-10-CM

## 2014-03-27 DIAGNOSIS — I5021 Acute systolic (congestive) heart failure: Secondary | ICD-10-CM | POA: Insufficient documentation

## 2014-03-27 DIAGNOSIS — E785 Hyperlipidemia, unspecified: Secondary | ICD-10-CM | POA: Diagnosis present

## 2014-03-27 DIAGNOSIS — I442 Atrioventricular block, complete: Secondary | ICD-10-CM | POA: Diagnosis present

## 2014-03-27 DIAGNOSIS — N183 Chronic kidney disease, stage 3 (moderate): Secondary | ICD-10-CM | POA: Diagnosis present

## 2014-03-27 DIAGNOSIS — R110 Nausea: Secondary | ICD-10-CM | POA: Diagnosis present

## 2014-03-27 DIAGNOSIS — I255 Ischemic cardiomyopathy: Secondary | ICD-10-CM | POA: Diagnosis present

## 2014-03-27 DIAGNOSIS — Z96652 Presence of left artificial knee joint: Secondary | ICD-10-CM | POA: Diagnosis present

## 2014-03-27 DIAGNOSIS — R570 Cardiogenic shock: Secondary | ICD-10-CM

## 2014-03-27 DIAGNOSIS — Z8673 Personal history of transient ischemic attack (TIA), and cerebral infarction without residual deficits: Secondary | ICD-10-CM

## 2014-03-27 DIAGNOSIS — I248 Other forms of acute ischemic heart disease: Secondary | ICD-10-CM | POA: Diagnosis present

## 2014-03-27 DIAGNOSIS — M810 Age-related osteoporosis without current pathological fracture: Secondary | ICD-10-CM | POA: Diagnosis present

## 2014-03-27 DIAGNOSIS — R34 Anuria and oliguria: Secondary | ICD-10-CM | POA: Diagnosis not present

## 2014-03-27 DIAGNOSIS — E876 Hypokalemia: Secondary | ICD-10-CM | POA: Diagnosis not present

## 2014-03-27 DIAGNOSIS — I2510 Atherosclerotic heart disease of native coronary artery without angina pectoris: Secondary | ICD-10-CM | POA: Diagnosis present

## 2014-03-27 DIAGNOSIS — Z96659 Presence of unspecified artificial knee joint: Secondary | ICD-10-CM | POA: Diagnosis present

## 2014-03-27 DIAGNOSIS — I5023 Acute on chronic systolic (congestive) heart failure: Secondary | ICD-10-CM | POA: Diagnosis not present

## 2014-03-27 DIAGNOSIS — Z452 Encounter for adjustment and management of vascular access device: Secondary | ICD-10-CM

## 2014-03-27 DIAGNOSIS — I481 Persistent atrial fibrillation: Secondary | ICD-10-CM | POA: Diagnosis present

## 2014-03-27 DIAGNOSIS — K5900 Constipation, unspecified: Secondary | ICD-10-CM

## 2014-03-27 DIAGNOSIS — Z9889 Other specified postprocedural states: Secondary | ICD-10-CM

## 2014-03-27 DIAGNOSIS — I959 Hypotension, unspecified: Secondary | ICD-10-CM

## 2014-03-27 DIAGNOSIS — I252 Old myocardial infarction: Secondary | ICD-10-CM

## 2014-03-27 LAB — I-STAT CHEM 8, ED
BUN: 20 mg/dL (ref 6–23)
CALCIUM ION: 0.89 mmol/L — AB (ref 1.13–1.30)
Chloride: 98 mEq/L (ref 96–112)
Creatinine, Ser: 1.4 mg/dL — ABNORMAL HIGH (ref 0.50–1.10)
GLUCOSE: 159 mg/dL — AB (ref 70–99)
HEMATOCRIT: 51 % — AB (ref 36.0–46.0)
HEMOGLOBIN: 17.3 g/dL — AB (ref 12.0–15.0)
Potassium: 6 mmol/L — ABNORMAL HIGH (ref 3.5–5.1)
Sodium: 133 mmol/L — ABNORMAL LOW (ref 135–145)
TCO2: 22 mmol/L (ref 0–100)

## 2014-03-27 LAB — CBC
HCT: 39.9 % (ref 36.0–46.0)
Hemoglobin: 13.7 g/dL (ref 12.0–15.0)
MCH: 32.9 pg (ref 26.0–34.0)
MCHC: 34.3 g/dL (ref 30.0–36.0)
MCV: 95.9 fL (ref 78.0–100.0)
PLATELETS: 163 10*3/uL (ref 150–400)
RBC: 4.16 MIL/uL (ref 3.87–5.11)
RDW: 16.6 % — AB (ref 11.5–15.5)
WBC: 4.2 10*3/uL (ref 4.0–10.5)

## 2014-03-27 LAB — PROTIME-INR
INR: 1.85 — ABNORMAL HIGH (ref 0.00–1.49)
Prothrombin Time: 21.5 seconds — ABNORMAL HIGH (ref 11.6–15.2)

## 2014-03-27 LAB — I-STAT TROPONIN, ED: Troponin i, poc: 0 ng/mL (ref 0.00–0.08)

## 2014-03-27 LAB — I-STAT CG4 LACTIC ACID, ED: Lactic Acid, Venous: 3.07 mmol/L — ABNORMAL HIGH (ref 0.5–2.2)

## 2014-03-27 LAB — APTT: aPTT: 36 seconds (ref 24–37)

## 2014-03-27 MED ORDER — DEXTROSE 50 % IV SOLN
1.0000 | Freq: Once | INTRAVENOUS | Status: AC
  Administered 2014-03-27: 50 mL via INTRAVENOUS
  Filled 2014-03-27: qty 50

## 2014-03-27 MED ORDER — LIDOCAINE HCL (CARDIAC) 20 MG/ML IV SOLN
INTRAVENOUS | Status: AC
Start: 2014-03-27 — End: 2014-03-28
  Filled 2014-03-27: qty 5

## 2014-03-27 MED ORDER — AMIODARONE HCL IN DEXTROSE 360-4.14 MG/200ML-% IV SOLN
60.0000 mg/h | INTRAVENOUS | Status: AC
  Administered 2014-03-27 – 2014-03-28 (×2): 60 mg/h via INTRAVENOUS
  Filled 2014-03-27: qty 200

## 2014-03-27 MED ORDER — PROCAINAMIDE HCL 100 MG/ML IJ SOLN
750.0000 mg | Freq: Once | INTRAMUSCULAR | Status: DC
Start: 2014-03-27 — End: 2014-03-28
  Filled 2014-03-27: qty 7.5

## 2014-03-27 MED ORDER — SODIUM CHLORIDE 0.9 % IV SOLN
1.0000 g | Freq: Once | INTRAVENOUS | Status: AC
  Administered 2014-03-27: 1 g via INTRAVENOUS
  Filled 2014-03-27: qty 10

## 2014-03-27 MED ORDER — AMIODARONE IV BOLUS ONLY 150 MG/100ML
150.0000 mg | Freq: Once | INTRAVENOUS | Status: AC
  Administered 2014-03-27: 150 mg via INTRAVENOUS

## 2014-03-27 MED ORDER — AMIODARONE HCL IN DEXTROSE 360-4.14 MG/200ML-% IV SOLN
30.0000 mg/h | INTRAVENOUS | Status: DC
  Administered 2014-03-28: 30 mg/h via INTRAVENOUS
  Filled 2014-03-27: qty 200

## 2014-03-27 MED ORDER — INSULIN ASPART 100 UNIT/ML ~~LOC~~ SOLN
10.0000 [IU] | Freq: Once | SUBCUTANEOUS | Status: AC
  Administered 2014-03-27: 10 [IU] via SUBCUTANEOUS
  Filled 2014-03-27: qty 1

## 2014-03-27 MED ORDER — ONDANSETRON HCL 4 MG/2ML IJ SOLN
INTRAMUSCULAR | Status: AC
  Administered 2014-03-27: 4 mg
  Filled 2014-03-27: qty 2

## 2014-03-27 MED ORDER — SODIUM CHLORIDE 0.9 % IV BOLUS (SEPSIS)
1000.0000 mL | Freq: Once | INTRAVENOUS | Status: AC
  Administered 2014-03-27: 1000 mL via INTRAVENOUS

## 2014-03-27 MED ORDER — LIDOCAINE HCL (CARDIAC) 20 MG/ML IV SOLN
50.0000 mg | Freq: Once | INTRAVENOUS | Status: AC
  Administered 2014-03-27: 50 mg via INTRAVENOUS

## 2014-03-27 NOTE — ED Provider Notes (Signed)
CSN: [id number]     Arrival date & time 03/31/2014  2248 History   None    Chief Complaint  Patient presents with  . Code STEMI     (Consider location/radiation/quality/duration/timing/severity/associated sxs/prior Treatment) Patient is a 64 y.o. female presenting with chest pain.  Chest Pain Pain location:  Substernal area Pain quality: pressure   Pain radiates to:  Does not radiate Pain radiates to the back: no   Pain severity:  Moderate Duration:  6 hours (since this afternoon, started as mild then worsened) Timing:  Constant Progression:  Worsening Chronicity:  New Associated symptoms: diaphoresis, fatigue, palpitations and shortness of breath   Associated symptoms: no abdominal pain, no back pain, no cough, no fever, no headache, no nausea and not vomiting   Risk factors: coronary artery disease   Risk factors comment:  Cardiomtyopathy, hx dysarrhythmias   Past Medical History  Diagnosis Date  . Spinal stenosis   . Numbness of foot   . Diverticulitis     a. s/p partial colectomy 1/12 with reversal in July 2012.  . Colitis, ischemic   . Ischemic cardiomyopathy     a. Echo 06/16/10: EF 25-30%, anteroseptal and apical hypokinesis, moderate AI, mild MR.   . LV (left ventricular) mural thrombus   . CKD (chronic kidney disease), stage III   . GERD (gastroesophageal reflux disease)   . Hypertension   . Hypothyroidism   . Hyperlipidemia   . Compression fracture     a. of L2  . 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 2/2 atrial fibrillation with RVR in the context of hypokalemia  . PAF (paroxysmal atrial fibrillation)     a. on Eliquis, digoxin, and amiodarone  . Anemia   . H/O hiatal hernia   . Sinus bradycardia   . Mitral regurgitation   . Chronic systolic CHF (congestive heart failure)   . CAD (coronary artery disease)     a.  Ant MI 8/03 with stenting to LAD;  b. staged PCI w/ DES to OM1 8/03;  c. s/p DES to LAD 7/04;  d.   multiple caths in past (chronically abnl ECG); e. cardiac cath 3/12: LAD stent patent, distal LAD 40-50%, small ostial D1 80%, ostial D2 60%, OM-1 stent patent (20-30%), proximal RCA 50%, prox- mRCA 40-50%; f. cath 2012 nonobs g. cath 06/2013 and 08/2013 for "STEMI" - mild nonobst CAD only.  [name] History of TIA (transient ischemic attack)   . Paroxysmal atrial tachycardia   . Paroxysmal atrial flutter   . Osteoporosis   . Oral thrush     a. By EGD 09/2013.  [name] Antral gastritis     a. By EGD 09/2013 - minimal.  . Ventricular tachycardia     a. 01/16/14 s/p amiodarone loading and ICD reprogramming  . 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 s/p ICD implantation in 2005. b. Gen change 2006. 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 c. s/p CRT-D implantation 07/2013.  [name] Heart murmur   . Myocardial infarction 2000  . History of blood transfusion     "related to knee replacement and ?"  . Stroke     "I've had ~ 4; 3 in left eye and 1 w/ablation in [place]" ; denies residual on 03/04/2014(  . DJD (degenerative joint disease)   . Arthritis     "all over"   Past Surgical History  Procedure Laterality Date  . Cardiac  defibrillator placement  07/2003; 10/2004; 2014  . Lumbar laminectomy/decompression microdiscectomy  10/2001    L5-S1/E-chart  . Knee arthroscopy Right   . Thyroidectomy  1970's  . Total knee arthroplasty Left 11/2003  . X-stop implantation  12/2004    L3-4; L4-5  . Fixation kyphoplasty thoracic spine  07/2007    T3, 4, 6 compression fractures  . Shoulder open rotator cuff repair Right 04/2008  . Lumbar laminectomy/decompression microdiscectomy  01/2010  . Peripherally inserted central catheter insertion  03/2010    removed upon discharge  . Colostomy reversal  12/2010  . Tonsillectomy and adenoidectomy  1969  . Appendectomy  03/2010  . Glaucoma surgery Right   . Cardiac defibrillator removal  12/17/2012     removed for infection  . Icd lead removal Left 12/17/2012    Procedure: ICD LEAD REMOVAL;  Surgeon: Evans Lance, MD;  Location: [place];  Service: Cardiovascular;  Laterality: Left;  . Bi-ventricular implantable cardioverter defibrillator  (crt-d)  08/05/2013    STJ Quadra Assura CRTD implanted by Dr Caryl Comes (right sided)  . Esophagogastroduodenoscopy N/A 10/02/2013    Procedure: ESOPHAGOGASTRODUODENOSCOPY (EGD);  Surgeon: Lear Ng, MD;  Location[facility] ENDOSCOPY;  Service: Endoscopy;  Laterality: N/A;  . Ablation of dysrhythmic focus  11/20/2013    [facility]  . Left heart catheterization with coronary angiogram N/A 02/17/2011    Procedure: LEFT HEART CATHETERIZATION WITH CORONARY ANGIOGRAM;  Surgeon: Peter M Martinique, MD;  Location: [facility] CATH LAB;  Service: Cardiovascular;  Laterality: N/A;  . Implantable cardioverter defibrillator revision N/A 06/07/2012    Procedure: IMPLANTABLE CARDIOVERTER DEFIBRILLATOR REVISION;  Surgeon: Thompson Grayer, MD;  Location: [facility] CATH LAB;  Service: Cardiovascular;  Laterality: N/A;  . Right heart catheterization N/A 06/06/2013    Procedure: RIGHT HEART CATH;  Surgeon: Larey Dresser, MD;  Location: [facility] CATH LAB;  Service: Cardiovascular;  Laterality: N/A;  . Left heart catheterization with coronary angiogram Bilateral 07/06/2013    Procedure: LEFT HEART CATHETERIZATION WITH CORONARY ANGIOGRAM;  Surgeon: Burnell Blanks, MD;  Location: [facility] CATH LAB;  Service: Cardiovascular;  Laterality: Bilateral;  . Bi-ventricular implantable cardioverter defibrillator N/A 08/05/2013    Procedure: BI-VENTRICULAR IMPLANTABLE CARDIOVERTER DEFIBRILLATOR  (CRT-D);  Surgeon: Deboraha Sprang, MD;  Location: [facility] CATH LAB;  Service: Cardiovascular;  Laterality: N/A;  . Av node ablation N/A 02/05/2014    Procedure: AV NODE ABLATION;  Surgeon: Evans Lance, MD;  Location: [facility] CATH LAB;  Service: Cardiovascular;  Laterality: N/A;  . Joint replacement    . Knee arthroscopy Right 1980's   . Back surgery  X 4  . Coronary angioplasty with stent placement  10/2001; 09/2002; ?date    "I've got a total of 4 or 5" (03/06/2014)  . Av node ablation  02/07/2014  . Colectomy  03/2010    sigmoid left; transverse   Family History  Problem Relation Age of Onset  . Diabetes Mother   . Diabetes Brother   . Arthritis      family history  . Prostate cancer      Family History   History  Substance Use Topics  . Smoking status: Former Smoker -- 0.10 packs/day for 15 years    Types: Cigarettes    Quit date: 04/21/2001  . Smokeless tobacco: Never Used     Comment: "smoked ~ 1 pack/month"  . Alcohol Use: 0.6 oz/week    1 Glasses of wine per week   OB History    No data available  Review of Systems  Constitutional: Positive for diaphoresis, activity change, appetite change and fatigue. Negative for fever.  HENT: Negative for sore throat.   Eyes: Negative for visual disturbance.  Respiratory: Positive for shortness of breath. Negative for cough.   Cardiovascular: Positive for chest pain and palpitations.  Gastrointestinal: Negative for nausea, vomiting, abdominal pain, diarrhea and constipation.  Genitourinary: Negative for difficulty urinating.  Musculoskeletal: Negative for back pain and neck pain.  Skin: Negative for rash.  Neurological: Negative for syncope and headaches.      Allergies  Coconut oil; Lansoprazole; Penicillins; Statins; Lisinopril; and Lactose intolerance (gi)  Home Medications   Prior to Admission medications   Medication Sig Start Date End Date Taking? Authorizing Provider  acetaminophen (TYLENOL) 325 MG tablet Take 2 tablets (650 mg total) by mouth every 4 (four) hours as needed for headache or mild pain. 02/07/14   Isaiah Serge, NP  amiodarone (PACERONE) 200 MG tablet Take 1 tablet (200 mg total) by mouth daily. 03/07/14   Thompson Grayer, MD  apixaban (ELIQUIS) 5 MG TABS tablet Take 1 tablet (5 mg total) by mouth 2 (two) times daily. 09/02/13    Jolaine Artist, MD  carvedilol (COREG) 3.125 MG tablet Take 2 tablets (6.25 mg total) by mouth 2 (two) times daily with a meal. 03/07/14   Thompson Grayer, MD  clonazePAM (KLONOPIN) 0.5 MG tablet Take 1 tablet (0.5 mg total) by mouth 2 (two) times daily as needed for anxiety. 09/16/13   Janith Lima, MD  fluticasone (FLONASE) 50 MCG/ACT nasal spray Place 2 sprays into both nostrils daily as needed for allergies.     Historical Provider, MD  furosemide (LASIX) 40 MG tablet Take 1 tablet (40 mg total) by mouth every other day. 02/07/14   Isaiah Serge, NP  levothyroxine (SYNTHROID, LEVOTHROID) 200 MCG tablet Take 200 mcg by mouth daily before breakfast.     Historical Provider, MD  mexiletine (MEXITIL) 200 MG capsule Take 1 capsule (200 mg total) by mouth every 12 (twelve) hours. 03/07/14   Thompson Grayer, MD  nitroGLYCERIN (NITROSTAT) 0.4 MG SL tablet Place 0.4 mg under the tongue every 5 (five) minutes as needed for chest pain.     Historical Provider, MD  omega-3 acid ethyl esters (LOVAZA) 1 G capsule Take 1 g by mouth daily.    Historical Provider, MD  ondansetron (ZOFRAN-ODT) 4 MG disintegrating tablet Take 4 mg by mouth 2 (two) times daily as needed for nausea or vomiting.    Historical Provider, MD  oxyCODONE-acetaminophen (PERCOCET/ROXICET) 5-325 MG per tablet Take 1 tablet by mouth every 6 (six) hours as needed for moderate pain.  02/24/14   Historical Provider, MD  pantoprazole (PROTONIX) 40 MG tablet Take 1 tablet (40 mg total) by mouth 2 (two) times daily. 07/12/13   Janith Lima, MD  polyethylene glycol [facility] / Floria Raveling) packet Take 17 g by mouth daily as needed (for constipation).     Historical Provider, MD  raloxifene (EVISTA) 60 MG tablet Take 60 mg by mouth daily.    Historical Provider, MD  spironolactone (ALDACTONE) 25 MG tablet Take 0.5 tablets (12.5 mg total) by mouth daily. 02/07/14   Isaiah Serge, NP   BP 68/48 mmHg  Pulse 153  Resp 19  Ht 5\' 4"  (1.626 m)  Wt 120 lb  (54.432 kg)  BMI 20.59 kg/m2  SpO2 100% Physical Exam  Constitutional: She is oriented to person, place, and time. She appears well-developed and well-nourished. She appears  ill. No distress.  HENT:  Head: Normocephalic and atraumatic.  Eyes: Conjunctivae and EOM are normal.  Neck: Normal range of motion.  Cardiovascular: Regular rhythm, normal heart sounds and intact distal pulses.  Tachycardia present.  Exam reveals no gallop and no friction rub.   No murmur heard. Pulses:      Radial pulses are 1+ on the right side, and 1+ on the left side.       Dorsalis pedis pulses are 1+ on the right side, and 1+ on the left side.  Pulmonary/Chest: Effort normal and breath sounds normal. No respiratory distress. She has no wheezes. She has no rales.  Abdominal: Soft. She exhibits no distension. There is no tenderness. There is no guarding.  Musculoskeletal: She exhibits no edema or tenderness.  Neurological: She is alert and oriented to person, place, and time.  Skin: Skin is dry. No rash noted. She is not diaphoretic. No erythema.  Cool extremities  Nursing note and vitals reviewed.   ED Course  ARTERIAL LINE Date/Time: 03/28/2014 2:13 AM Performed by: Alvino Chapel Authorized by: Alvino Chapel Consent: Verbal consent obtained. Written consent obtained. Risks and benefits: risks, benefits and alternatives were discussed Consent given by: patient Patient understanding: patient states understanding of the procedure being performed Patient consent: the patient's understanding of the procedure matches consent given Procedure consent: procedure consent matches procedure scheduled Relevant documents: relevant documents present and verified Test results: test results available and properly labeled Site marked: the operative site was marked Imaging studies: imaging studies available Required items: required blood products, implants, devices, and special equipment  available Patient identity confirmed: verbally with patient Time out: Immediately prior to procedure a "time out" was called to verify the correct patient, procedure, equipment, support staff and site/side marked as required. Preparation: Patient was prepped and draped in the usual sterile fashion. Indications: hemodynamic monitoring Location: right radial Local anesthetic: lidocaine 1% without epinephrine Anesthetic total: 2 ml Patient sedated: no Allen's test normal: yes Seldinger technique: Seldinger technique used Number of attempts: 5 or more Post-procedure: dressing applied Post-procedure CMS: normal Patient tolerance: Patient tolerated the procedure well with no immediate complications   (including critical care time) Labs Review Labs Reviewed  CBC - Abnormal; Notable for the following:    RDW 16.6 (*)    All other components within normal limits  I-STAT CHEM 8, ED - Abnormal; Notable for the following:    Sodium 133 (*)    Potassium 6.0 (*)    Creatinine, Ser 1.40 (*)    Glucose, Bld 159 (*)    Calcium, Ion 0.89 (*)    Hemoglobin 17.3 (*)    HCT 51.0 (*)    All other components within normal limits  I-STAT CG4 LACTIC ACID, ED - Abnormal; Notable for the following:    Lactic Acid, Venous 3.07 (*)    All other components within normal limits  APTT  COMPREHENSIVE METABOLIC PANEL  PROTIME-INR  TROPONIN I  MAGNESIUM  PHOSPHORUS  I-STAT TROPOININ, ED    Imaging Review No results found.   EKG Interpretation None      MDM   Final diagnoses:  None   64 yo female with CAD s/p multiple PCIs in the past, ischemic cardiomyopathy s/p primary prevention ICD 2006, pAF/AFL/AT s/p PVI with inappropriate ICD discharges, VT with appropriate ICD discharges, and chronic systolic CHF with EF 51-70% who presents to ED as a Code STEMI activated by EMS.  Cardiology at bedside and patient arrival and EKG  shows wide complex tachycardia consistent with ventricular tachycardia with  a rate between 140 and 150.  Patient was awake mentating well however we are unable to obtain an accurate blood pressure. (68/48 manual only BP able to record)  She was given a bolus of 150 mg of amiodarone and started on a drip. She was subsequently given a bolus of 50 mg of lidocaine. Her i-STAT showed a potassium of 6 and she was empirically treated for hyperkalemia with a gram of calcium gluconate and insulin and dextrose.  Her rhythm converted back to a paced rhythm at rate of the 60s to 70s.   Patient continued to [facility] well however we were unable to obtain blood pressures manually or by machine and for this reason an arterial line was placed.  Arterial line shows normal blood pressures.    Potassium on CMP was not hemolyzed and was 4.7. Given previous hyperkalemic interventions given a recheck of her potassium was done which was 2.6 and potassium replacement was ordered.  Patient with chest pressure likely secondary to ventricular tachycardia.  Her initial troponin is negative. Creatinine and lactic acid are increased likely secondary to poor perfusion in setting of ventricular tachycardia.  She was admitted to the stepdown unit with Cardiology for continued monitoring and management.  Alvino Chapel, MD 03/28/14 [id number]  Wandra Arthurs, MD 03/28/14 [phone]

## 2014-03-27 NOTE — ED Notes (Signed)
Per ems-- pt c/o substernal pressure in chest 2 hours ago with nausea, sob. Ems administered 324 asa, 4mg  zofran and 1 nitro pta. Pt arrived to ER in [place]. Pt conscious a&o at present.

## 2014-03-27 NOTE — ED Notes (Signed)
Pharmacy notified for drip.

## 2014-03-27 NOTE — ED Notes (Signed)
CODE STEMI CALLED OFF

## 2014-03-28 ENCOUNTER — Encounter (HOSPITAL_COMMUNITY): Payer: Self-pay | Admitting: *Deleted

## 2014-03-28 DIAGNOSIS — R110 Nausea: Secondary | ICD-10-CM | POA: Diagnosis present

## 2014-03-28 DIAGNOSIS — E039 Hypothyroidism, unspecified: Secondary | ICD-10-CM | POA: Diagnosis present

## 2014-03-28 DIAGNOSIS — K5900 Constipation, unspecified: Secondary | ICD-10-CM

## 2014-03-28 DIAGNOSIS — I5021 Acute systolic (congestive) heart failure: Secondary | ICD-10-CM | POA: Diagnosis not present

## 2014-03-28 DIAGNOSIS — Z79899 Other long term (current) drug therapy: Secondary | ICD-10-CM | POA: Diagnosis not present

## 2014-03-28 DIAGNOSIS — I442 Atrioventricular block, complete: Secondary | ICD-10-CM | POA: Diagnosis present

## 2014-03-28 DIAGNOSIS — E872 Acidosis, unspecified: Secondary | ICD-10-CM

## 2014-03-28 DIAGNOSIS — T40605A Adverse effect of unspecified narcotics, initial encounter: Secondary | ICD-10-CM | POA: Diagnosis present

## 2014-03-28 DIAGNOSIS — Z515 Encounter for palliative care: Secondary | ICD-10-CM | POA: Diagnosis not present

## 2014-03-28 DIAGNOSIS — N183 Chronic kidney disease, stage 3 (moderate): Secondary | ICD-10-CM

## 2014-03-28 DIAGNOSIS — I4892 Unspecified atrial flutter: Secondary | ICD-10-CM | POA: Diagnosis present

## 2014-03-28 DIAGNOSIS — E43 Unspecified severe protein-calorie malnutrition: Secondary | ICD-10-CM | POA: Diagnosis present

## 2014-03-28 DIAGNOSIS — Z87891 Personal history of nicotine dependence: Secondary | ICD-10-CM | POA: Diagnosis not present

## 2014-03-28 DIAGNOSIS — I130 Hypertensive heart and chronic kidney disease with heart failure and stage 1 through stage 4 chronic kidney disease, or unspecified chronic kidney disease: Secondary | ICD-10-CM | POA: Diagnosis present

## 2014-03-28 DIAGNOSIS — R570 Cardiogenic shock: Secondary | ICD-10-CM | POA: Diagnosis not present

## 2014-03-28 DIAGNOSIS — Z95810 Presence of automatic (implantable) cardiac defibrillator: Secondary | ICD-10-CM | POA: Diagnosis not present

## 2014-03-28 DIAGNOSIS — R34 Anuria and oliguria: Secondary | ICD-10-CM | POA: Diagnosis not present

## 2014-03-28 DIAGNOSIS — Z833 Family history of diabetes mellitus: Secondary | ICD-10-CM | POA: Diagnosis not present

## 2014-03-28 DIAGNOSIS — I481 Persistent atrial fibrillation: Secondary | ICD-10-CM | POA: Diagnosis present

## 2014-03-28 DIAGNOSIS — K3184 Gastroparesis: Secondary | ICD-10-CM | POA: Diagnosis present

## 2014-03-28 DIAGNOSIS — I252 Old myocardial infarction: Secondary | ICD-10-CM | POA: Diagnosis not present

## 2014-03-28 DIAGNOSIS — I2510 Atherosclerotic heart disease of native coronary artery without angina pectoris: Secondary | ICD-10-CM | POA: Diagnosis present

## 2014-03-28 DIAGNOSIS — N179 Acute kidney failure, unspecified: Secondary | ICD-10-CM | POA: Diagnosis present

## 2014-03-28 DIAGNOSIS — R079 Chest pain, unspecified: Secondary | ICD-10-CM | POA: Diagnosis present

## 2014-03-28 DIAGNOSIS — T462X5A Adverse effect of other antidysrhythmic drugs, initial encounter: Secondary | ICD-10-CM | POA: Diagnosis present

## 2014-03-28 DIAGNOSIS — Z96652 Presence of left artificial knee joint: Secondary | ICD-10-CM | POA: Diagnosis present

## 2014-03-28 DIAGNOSIS — R29810 Facial weakness: Secondary | ICD-10-CM | POA: Diagnosis present

## 2014-03-28 DIAGNOSIS — Z96659 Presence of unspecified artificial knee joint: Secondary | ICD-10-CM | POA: Diagnosis present

## 2014-03-28 DIAGNOSIS — R197 Diarrhea, unspecified: Secondary | ICD-10-CM | POA: Diagnosis present

## 2014-03-28 DIAGNOSIS — E875 Hyperkalemia: Secondary | ICD-10-CM | POA: Diagnosis not present

## 2014-03-28 DIAGNOSIS — E876 Hypokalemia: Secondary | ICD-10-CM | POA: Diagnosis not present

## 2014-03-28 DIAGNOSIS — Z8673 Personal history of transient ischemic attack (TIA), and cerebral infarction without residual deficits: Secondary | ICD-10-CM | POA: Diagnosis not present

## 2014-03-28 DIAGNOSIS — I255 Ischemic cardiomyopathy: Secondary | ICD-10-CM | POA: Diagnosis present

## 2014-03-28 DIAGNOSIS — Z7901 Long term (current) use of anticoagulants: Secondary | ICD-10-CM | POA: Diagnosis not present

## 2014-03-28 DIAGNOSIS — K219 Gastro-esophageal reflux disease without esophagitis: Secondary | ICD-10-CM | POA: Diagnosis present

## 2014-03-28 DIAGNOSIS — I5023 Acute on chronic systolic (congestive) heart failure: Secondary | ICD-10-CM | POA: Diagnosis not present

## 2014-03-28 DIAGNOSIS — I248 Other forms of acute ischemic heart disease: Secondary | ICD-10-CM | POA: Diagnosis present

## 2014-03-28 DIAGNOSIS — Z955 Presence of coronary angioplasty implant and graft: Secondary | ICD-10-CM | POA: Diagnosis not present

## 2014-03-28 DIAGNOSIS — M810 Age-related osteoporosis without current pathological fracture: Secondary | ICD-10-CM | POA: Diagnosis present

## 2014-03-28 DIAGNOSIS — K5909 Other constipation: Secondary | ICD-10-CM | POA: Diagnosis present

## 2014-03-28 DIAGNOSIS — I472 Ventricular tachycardia: Secondary | ICD-10-CM | POA: Diagnosis present

## 2014-03-28 DIAGNOSIS — R2689 Other abnormalities of gait and mobility: Secondary | ICD-10-CM | POA: Diagnosis not present

## 2014-03-28 DIAGNOSIS — I5032 Chronic diastolic (congestive) heart failure: Secondary | ICD-10-CM | POA: Diagnosis not present

## 2014-03-28 DIAGNOSIS — I480 Paroxysmal atrial fibrillation: Secondary | ICD-10-CM | POA: Diagnosis not present

## 2014-03-28 DIAGNOSIS — E785 Hyperlipidemia, unspecified: Secondary | ICD-10-CM | POA: Diagnosis present

## 2014-03-28 DIAGNOSIS — Z66 Do not resuscitate: Secondary | ICD-10-CM | POA: Diagnosis not present

## 2014-03-28 DIAGNOSIS — Z6820 Body mass index (BMI) 20.0-20.9, adult: Secondary | ICD-10-CM | POA: Diagnosis not present

## 2014-03-28 LAB — BASIC METABOLIC PANEL
Anion gap: 12 (ref 5–15)
BUN: 11 mg/dL (ref 6–23)
CHLORIDE: 104 meq/L (ref 96–112)
CO2: 18 mmol/L — ABNORMAL LOW (ref 19–32)
Calcium: 7.8 mg/dL — ABNORMAL LOW (ref 8.4–10.5)
Creatinine, Ser: 1.32 mg/dL — ABNORMAL HIGH (ref 0.50–1.10)
GFR calc Af Amer: 48 mL/min — ABNORMAL LOW (ref >90)
GFR, EST NON AFRICAN AMERICAN: 42 mL/min — AB (ref >90)
Glucose, Bld: 96 mg/dL (ref 70–99)
Potassium: 3.6 mmol/L (ref 3.5–5.1)
Sodium: 134 mmol/L — ABNORMAL LOW (ref 135–145)

## 2014-03-28 LAB — MAGNESIUM
MAGNESIUM: 1.5 mg/dL (ref 1.5–2.5)
MAGNESIUM: 1.3 mg/dL — AB (ref 1.5–2.5)
MAGNESIUM: 2 mg/dL (ref 1.5–2.5)

## 2014-03-28 LAB — COMPREHENSIVE METABOLIC PANEL
ALT: 36 U/L — ABNORMAL HIGH (ref 0–35)
AST: 94 U/L — AB (ref 0–37)
Albumin: 3 g/dL — ABNORMAL LOW (ref 3.5–5.2)
Alkaline Phosphatase: 77 U/L (ref 39–117)
Anion gap: 15 (ref 5–15)
BUN: 14 mg/dL (ref 6–23)
CHLORIDE: 100 meq/L (ref 96–112)
CO2: 19 mmol/L (ref 19–32)
Calcium: 8 mg/dL — ABNORMAL LOW (ref 8.4–10.5)
Creatinine, Ser: 1.57 mg/dL — ABNORMAL HIGH (ref 0.50–1.10)
GFR calc non Af Amer: 34 mL/min — ABNORMAL LOW (ref >90)
GFR, EST AFRICAN AMERICAN: 39 mL/min — AB (ref >90)
GLUCOSE: 193 mg/dL — AB (ref 70–99)
Potassium: 4.7 mmol/L (ref 3.5–5.1)
Sodium: 134 mmol/L — ABNORMAL LOW (ref 135–145)
TOTAL PROTEIN: 6.3 g/dL (ref 6.0–8.3)
Total Bilirubin: 1.3 mg/dL — ABNORMAL HIGH (ref 0.3–1.2)

## 2014-03-28 LAB — TROPONIN I: Troponin I: <0.03 ng/mL (ref <0.031)

## 2014-03-28 LAB — POTASSIUM: Potassium: 2.6 mmol/L — CL (ref 3.5–5.1)

## 2014-03-28 LAB — PHOSPHORUS: PHOSPHORUS: 3.5 mg/dL (ref 2.3–4.6)

## 2014-03-28 MED ORDER — POTASSIUM CHLORIDE 10 MEQ/100ML IV SOLN
10.0000 meq | Freq: Once | INTRAVENOUS | Status: AC
  Administered 2014-03-28: 10 meq via INTRAVENOUS
  Filled 2014-03-28: qty 100

## 2014-03-28 MED ORDER — ACETAMINOPHEN 325 MG PO TABS: 650.0000 mg | ORAL_TABLET | ORAL | Status: DC | PRN

## 2014-03-28 MED ORDER — CARVEDILOL 3.125 MG PO TABS
3.1250 mg | ORAL_TABLET | Freq: Two times a day (BID) | ORAL | Status: DC
  Administered 2014-03-28 – 2014-04-07 (×15): 3.125 mg via ORAL
  Filled 2014-03-28 (×25): qty 1

## 2014-03-28 MED ORDER — CLONAZEPAM 0.5 MG PO TABS
0.5000 mg | ORAL_TABLET | Freq: Two times a day (BID) | ORAL | Status: DC | PRN
  Administered 2014-03-28 – 2014-04-06 (×2): 0.5 mg via ORAL
  Filled 2014-03-28 (×2): qty 1

## 2014-03-28 MED ORDER — LEVOTHYROXINE SODIUM 200 MCG PO TABS
200.0000 ug | ORAL_TABLET | Freq: Every day | ORAL | Status: DC
  Administered 2014-03-28 – 2014-04-08 (×12): 200 ug via ORAL
  Filled 2014-03-28 (×14): qty 1

## 2014-03-28 MED ORDER — AMIODARONE HCL 200 MG PO TABS
400.0000 mg | ORAL_TABLET | Freq: Two times a day (BID) | ORAL | Status: DC
  Administered 2014-03-28 – 2014-03-31 (×7): 400 mg via ORAL
  Filled 2014-03-28 (×8): qty 2

## 2014-03-28 MED ORDER — APIXABAN 5 MG PO TABS
5.0000 mg | ORAL_TABLET | Freq: Two times a day (BID) | ORAL | Status: DC
  Administered 2014-03-28 – 2014-03-29 (×3): 5 mg via ORAL
  Filled 2014-03-28 (×4): qty 1

## 2014-03-28 MED ORDER — ONDANSETRON HCL 4 MG/2ML IJ SOLN: 4.0000 mg | Freq: Four times a day (QID) | INTRAMUSCULAR | Status: DC

## 2014-03-28 MED ORDER — ONDANSETRON HCL 4 MG/2ML IJ SOLN
4.0000 mg | Freq: Four times a day (QID) | INTRAMUSCULAR | Status: DC | PRN
  Administered 2014-03-28 – 2014-04-06 (×15): 4 mg via INTRAVENOUS
  Filled 2014-03-28 (×16): qty 2

## 2014-03-28 MED ORDER — POTASSIUM CHLORIDE CRYS ER 20 MEQ PO TBCR
40.0000 meq | EXTENDED_RELEASE_TABLET | Freq: Once | ORAL | Status: AC
  Administered 2014-03-28: 40 meq via ORAL
  Filled 2014-03-28: qty 2

## 2014-03-28 MED ORDER — BOOST / RESOURCE BREEZE PO LIQD
1.0000 | Freq: Three times a day (TID) | ORAL | Status: DC
  Administered 2014-03-28 – 2014-04-08 (×21): 1 via ORAL
  Filled 2014-03-28: qty 1

## 2014-03-28 MED ORDER — SODIUM CHLORIDE 0.9 % IJ SOLN
3.0000 mL | Freq: Two times a day (BID) | INTRAMUSCULAR | Status: DC
  Administered 2014-03-28 – 2014-04-08 (×20): 3 mL via INTRAVENOUS

## 2014-03-28 MED ORDER — POLYETHYLENE GLYCOL 3350 17 G PO PACK
17.0000 g | PACK | Freq: Every day | ORAL | Status: DC | PRN
  Administered 2014-03-30: 17 g via ORAL
  Filled 2014-03-28 (×2): qty 1

## 2014-03-28 MED ORDER — ONDANSETRON HCL 4 MG/2ML IJ SOLN
INTRAMUSCULAR | Status: AC
  Filled 2014-03-28: qty 2

## 2014-03-28 MED ORDER — PANTOPRAZOLE SODIUM 40 MG PO TBEC
40.0000 mg | DELAYED_RELEASE_TABLET | Freq: Two times a day (BID) | ORAL | Status: DC
  Administered 2014-03-28 – 2014-04-08 (×23): 40 mg via ORAL
  Filled 2014-03-28 (×21): qty 1

## 2014-03-28 MED ORDER — MEXILETINE HCL 200 MG PO CAPS
200.0000 mg | ORAL_CAPSULE | Freq: Two times a day (BID) | ORAL | Status: DC
  Administered 2014-03-28 – 2014-04-08 (×23): 200 mg via ORAL
  Filled 2014-03-28 (×27): qty 1

## 2014-03-28 MED ORDER — BUPRENORPHINE 7.5 MCG/HR TD PTWK: 1.0000 | MEDICATED_PATCH | TRANSDERMAL | Status: DC

## 2014-03-28 MED ORDER — MAGNESIUM SULFATE 2 GM/50ML IV SOLN
2.0000 g | Freq: Once | INTRAVENOUS | Status: AC
  Administered 2014-03-28: 2 g via INTRAVENOUS
  Filled 2014-03-28: qty 50

## 2014-03-28 MED ORDER — SODIUM CHLORIDE 0.9 % IV SOLN: INTRAVENOUS | Status: DC | PRN

## 2014-03-28 MED ORDER — RALOXIFENE HCL 60 MG PO TABS
60.0000 mg | ORAL_TABLET | Freq: Every day | ORAL | Status: DC
  Administered 2014-03-28 – 2014-04-08 (×11): 60 mg via ORAL
  Filled 2014-03-28 (×13): qty 1

## 2014-03-28 MED ORDER — ASPIRIN EC 81 MG PO TBEC
81.0000 mg | DELAYED_RELEASE_TABLET | Freq: Every day | ORAL | Status: DC
  Administered 2014-03-28 – 2014-04-03 (×6): 81 mg via ORAL
  Filled 2014-03-28 (×8): qty 1

## 2014-03-28 MED ORDER — LIDOCAINE HCL (PF) 1 % IJ SOLN
5.0000 mL | Freq: Once | INTRAMUSCULAR | Status: AC
  Administered 2014-03-28: 5 mL via INTRADERMAL
  Filled 2014-03-28: qty 5

## 2014-03-28 MED ORDER — DOCUSATE SODIUM 100 MG PO CAPS
100.0000 mg | ORAL_CAPSULE | Freq: Two times a day (BID) | ORAL | Status: DC
  Administered 2014-03-28 – 2014-04-08 (×19): 100 mg via ORAL
  Filled 2014-03-28 (×28): qty 1

## 2014-03-28 NOTE — Progress Notes (Signed)
Patient Name: [name]      SUBJECTIVE: admitted with slow VT below detection rate asssoc with LH and nausea recuirrent vt without symptoms Past Medical History  Diagnosis Date  . Spinal stenosis   . Numbness of foot   . Diverticulitis     a. s/p partial colectomy 1/12 with reversal in July 2012.  . Colitis, ischemic   . Ischemic cardiomyopathy     a. Echo 06/16/10: EF 25-30%, anteroseptal and apical hypokinesis, moderate AI, mild MR.   . LV (left ventricular) mural thrombus   . CKD (chronic kidney disease), stage III   . GERD (gastroesophageal reflux disease)   . Hypertension   . Hypothyroidism   . Hyperlipidemia   . Compression fracture     a. of L2  . 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 2/2 atrial fibrillation with RVR in the context of hypokalemia  . PAF (paroxysmal atrial fibrillation)     a. on Eliquis, digoxin, and amiodarone  . Anemia   . H/O hiatal hernia   . Sinus bradycardia   . Mitral regurgitation   . Chronic systolic CHF (congestive heart failure)   . CAD (coronary artery disease)     a.  Ant MI 8/03 with stenting to LAD;  b. staged PCI w/ DES to OM1 8/03;  c. s/p DES to LAD 7/04;  d.  multiple caths in past (chronically abnl ECG); e. cardiac cath 3/12: LAD stent patent, distal LAD 40-50%, small ostial D1 80%, ostial D2 60%, OM-1 stent patent (20-30%), proximal RCA 50%, prox- mRCA 40-50%; f. cath 2012 nonobs g. cath 06/2013 and 08/2013 for "STEMI" - mild nonobst CAD only.  [name] History of TIA (transient ischemic attack)   . Paroxysmal atrial tachycardia   . Paroxysmal atrial flutter   . Osteoporosis   . Oral thrush     a. By EGD 09/2013.  [name] Antral gastritis     a. By EGD 09/2013 - minimal.  . Ventricular tachycardia     a. 01/16/14 s/p amiodarone loading and ICD reprogramming  . 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 s/p ICD implantation in 2005. b. Gen change 2006. Dual  chamber upgrade 2014 with subsequent extraction due to infection 11/2012 -> Lifevest. c. s/p CRT-D implantation 07/2013.  [name] Heart murmur   . Myocardial infarction 2000  . History of blood transfusion     "related to knee replacement and ?"  . Stroke     "I've had ~ 4; 3 in left eye and 1 w/ablation in [place]" ; denies residual on 03/04/2014(  . DJD (degenerative joint disease)   . Arthritis     "all over"    Scheduled Meds:  Scheduled Meds: . amiodarone  400 mg Oral BID  . apixaban  5 mg Oral BID  . aspirin EC  81 mg Oral Daily  . Buprenorphine  1 patch Transdermal Weekly  . carvedilol  3.125 mg Oral BID WC  . levothyroxine  200 mcg Oral QAC breakfast  . lidocaine (cardiac) 100 mg/52ml      . mexiletine  200 mg Oral Q12H  . pantoprazole  40 mg Oral BID  . raloxifene  60 mg Oral Daily  . sodium chloride  3 mL Intravenous Q12H   Continuous Infusions:   acetaminophen, clonazePAM, polyethylene glycol    PHYSICAL EXAM Filed Vitals:   03/28/14 0200 03/28/14 0300 03/28/14 0400 03/28/14 0500  BP:  103/54    Pulse: 68 65    Temp:  97.7 F (36.5 C)    TempSrc:  Oral    Resp: 17 17 11 21   Height:      Weight:      SpO2: 100% 100%      Well developed and nourished in no acute distress HENT normal Neck supple with JVP-flat Clear Regular rate and rhythm, no murmurs or gallops Abd-soft with active BS No Clubbing cyanosis edema Skin-warm and dry A & Oriented  Grossly normal sensory and motor function  TELEMETRY: Reviewed telemetry pt in recurrent VT last pm    Intake/Output Summary (Last 24 hours) at 03/28/14 0641 Last data filed at 03/28/14 0500  Gross per 24 hour  Intake 593.26 ml  Output      0 ml  Net 593.26 ml    LABS: Basic Metabolic Panel:  Recent Labs Lab 04/14/2014 2259 04/05/2014 2308 03/28/14 0115 03/28/14 0540  NA 134* 133*  --  134*  K 4.7 6.0* 2.6* 3.6  CL 100 98  --  104  CO2 19  --   --  18*  GLUCOSE 193* 159*  --  96  BUN 14 20  --  11    CREATININE 1.57* 1.40*  --  1.32*  CALCIUM 8.0*  --   --  7.8*  MG 1.5  --  1.3* 2.0  PHOS 3.5  --   --   --    Cardiac Enzymes:  Recent Labs  03/22/2014 2259 03/28/14 0540  TROPONINI <0.03 <0.03   CBC:  Recent Labs Lab 04/14/2014 2259 04/04/2014 2308  WBC 4.2  --   HGB 13.7 17.3*  HCT 39.9 51.0*  MCV 95.9  --   PLT 163  --    PROTIME:  Recent Labs  03/26/2014 2259  LABPROT 21.5*  INR 1.85*   Liver Function Tests:  Recent Labs  04/04/2014 2259  AST 94*  ALT 36*  ALKPHOS 77  BILITOT 1.3*  PROT 6.3  ALBUMIN 3.0*   No results for input(s): LIPASE, AMYLASE in the last 72 hours. BNP: BNP (last 3 results)  Recent Labs  06/04/13 0957 06/18/13 1327 02/05/14 2230  PROBNP 2458.0* 1223.0* 5151.0*    Device Interrogation:    ASSESSMENT AND PLAN:  Active Problems:   Sustained ventricular tachycardia   Chest pain   AKI (acute kidney injury)   Lactic acidosis   Pain in the chest   Hypomagnesemia   Constipated  preenteed withvt at 400 msec Recurrent VT last PM Electrolyte abnormality  Low mg and ????K Will repeat K this am Continue mag repletion  Reprogram ICD Continue AMIO  60 mg  >>PO inam   Constipation worse on narcotics ( back pain) begin colace  Signed, Virl Axe MD  03/28/2014

## 2014-03-28 NOTE — ED Notes (Signed)
Resident placing A-line. Consent obtained.

## 2014-03-28 NOTE — Progress Notes (Signed)
INITIAL NUTRITION ASSESSMENT  DOCUMENTATION CODES Per approved criteria  -Severe malnutrition in the context of acute illness or injury   INTERVENTION:  Resource Breeze PO TID, each supplement provides 250 kcal and 9 grams of protein  NUTRITION DIAGNOSIS: Malnutrition related to inadequate oral intake as evidenced by 13% weight loss in 3 months and moderate depletion of muscle mass.   Goal: Intake to meet >90% of estimated nutrition needs.  Monitor:  PO intake, labs, weight trend.  Reason for Assessment: Malnutrition Screening Tool  64 y.o. female  Admitting Dx: [name]   ASSESSMENT: Admitted on 1/7 with slow VT below detection rate associated with LH and nausea.  Patient reports that she has been losing weight for the past 2-3 months. She recently started on a pain patch a week ago and it is very strong and slows down her digestion and affects her swallowing. She has been drinking Facilities manager to maximize oral intake. Weight loss is significant, 13% in three months, 7% in one month.  Nutrition Focused Physical Exam:  Subcutaneous Fat:  Orbital Region: WNL Upper Arm Region: WNL Thoracic and Lumbar Region: NA  Muscle:  Temple Region: WNL Clavicle Bone Region: mild-moderate depletion Clavicle and Acromion Bone Region: mild-moderate depletion Scapular Bone Region: NA Dorsal Hand: NA Patellar Region: mild depletion Anterior Thigh Region: moderate depletion Posterior Calf Region: mild depletion  Edema: none   Height: Ht Readings from Last 1 Encounters:  03/21/2014 5\' 4"  (1.626 m)    Weight: Wt Readings from Last 1 Encounters:  04/20/2014 120 lb (54.432 kg)    Ideal Body Weight: 54.5 kg  % Ideal Body Weight: 100%  Wt Readings from Last 10 Encounters:  03/26/2014 120 lb (54.432 kg)  03/11/14 129 lb 9.6 oz (58.786 kg)  03/07/14 129 lb 11.2 oz (58.832 kg)  02/07/14 128 lb 4.9 oz (58.2 kg)  01/27/14 134 lb 12.8 oz (61.145 kg)  01/19/14 138 lb 12.8 oz (62.959  kg)  12/06/13 133 lb (60.328 kg)  11/05/13 131 lb (59.421 kg)  10/02/13 128 lb 8 oz (58.287 kg)  09/16/13 129 lb (58.514 kg)    Usual Body Weight: 138 lb (3 months ago)  % Usual Body Weight: 87%  BMI:  Body mass index is 20.59 kg/(m^2).  Estimated Nutritional Needs: Kcal: 1500-1700 Protein: 75-90 gm Fluid: 1.5-1.7 L  Skin: WDL  Diet Order: Diet Heart  EDUCATION NEEDS: -Education needs addressed   Intake/Output Summary (Last 24 hours) at 03/28/14 1000 Last data filed at 03/28/14 0900  Gross per 24 hour  Intake 745.69 ml  Output      0 ml  Net 745.69 ml    Last BM: PTA   Labs:   Recent Labs Lab 04/03/2014 2259 03/24/2014 2308 03/28/14 0115 03/28/14 0540  NA 134* 133*  --  134*  K 4.7 6.0* 2.6* 3.6  CL 100 98  --  104  CO2 19  --   --  18*  BUN 14 20  --  11  CREATININE 1.57* 1.40*  --  1.32*  CALCIUM 8.0*  --   --  7.8*  MG 1.5  --  1.3* 2.0  PHOS 3.5  --   --   --   GLUCOSE 193* 159*  --  96    CBG (last 3)  No results for input(s): GLUCAP in the last 72 hours.  Scheduled Meds: . amiodarone  400 mg Oral BID  . apixaban  5 mg Oral BID  . aspirin EC  81 mg Oral Daily  . Buprenorphine  1 patch Transdermal Weekly  . carvedilol  3.125 mg Oral BID WC  . docusate sodium  100 mg Oral BID  . levothyroxine  200 mcg Oral QAC breakfast  . lidocaine (cardiac) 100 mg/35ml      . mexiletine  200 mg Oral Q12H  . pantoprazole  40 mg Oral BID  . raloxifene  60 mg Oral Daily  . sodium chloride  3 mL Intravenous Q12H    Continuous Infusions:   Past Medical History  Diagnosis Date  . Spinal stenosis   . Numbness of foot   . Diverticulitis     a. s/p partial colectomy 1/12 with reversal in July 2012.  . Colitis, ischemic   . Ischemic cardiomyopathy     a. Echo 06/16/10: EF 25-30%, anteroseptal and apical hypokinesis, moderate AI, mild MR.   . LV (left ventricular) mural thrombus   . CKD (chronic kidney disease), stage III   . GERD (gastroesophageal reflux  disease)   . Hypertension   . Hypothyroidism   . Hyperlipidemia   . Compression fracture     a. of L2  . 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 2/2 atrial fibrillation with RVR in the context of hypokalemia  . PAF (paroxysmal atrial fibrillation)     a. on Eliquis, digoxin, and amiodarone  . Anemia   . H/O hiatal hernia   . Sinus bradycardia   . Mitral regurgitation   . Chronic systolic CHF (congestive heart failure)   . CAD (coronary artery disease)     a.  Ant MI 8/03 with stenting to LAD;  b. staged PCI w/ DES to OM1 8/03;  c. s/p DES to LAD 7/04;  d.  multiple caths in past (chronically abnl ECG); e. cardiac cath 3/12: LAD stent patent, distal LAD 40-50%, small ostial D1 80%, ostial D2 60%, OM-1 stent patent (20-30%), proximal RCA 50%, prox- mRCA 40-50%; f. cath 2012 nonobs g. cath 06/2013 and 08/2013 for "STEMI" - mild nonobst CAD only.  [name] History of TIA (transient ischemic attack)   . Paroxysmal atrial tachycardia   . Paroxysmal atrial flutter   . Osteoporosis   . Oral thrush     a. By EGD 09/2013.  [name] Antral gastritis     a. By EGD 09/2013 - minimal.  . Ventricular tachycardia     a. 01/16/14 s/p amiodarone loading and ICD reprogramming  . 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 s/p ICD implantation in 2005. b. Gen change 2006. 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 c. s/p CRT-D implantation 07/2013.  [name] Heart murmur   . Myocardial infarction 2000  . History of blood transfusion     "related to knee replacement and ?"  . Stroke     "I've had ~ 4; 3 in left eye and 1 w/ablation in [place]" ; denies residual on 03/04/2014(  . DJD (degenerative joint disease)   . Arthritis     "all over"    Past Surgical History  Procedure Laterality Date  . Cardiac defibrillator placement  07/2003; 10/2004; 2014  . Lumbar laminectomy/decompression microdiscectomy  10/2001     L5-S1/E-chart  . Knee arthroscopy Right   . Thyroidectomy  1970's  . Total knee arthroplasty Left 11/2003  . X-stop implantation  12/2004    L3-4; L4-5  . Fixation kyphoplasty thoracic spine  07/2007    T3,  4, 6 compression fractures  . Shoulder open rotator cuff repair Right 04/2008  . Lumbar laminectomy/decompression microdiscectomy  01/2010  . Peripherally inserted central catheter insertion  03/2010    removed upon discharge  . Colostomy reversal  12/2010  . Tonsillectomy and adenoidectomy  1969  . Appendectomy  03/2010  . Glaucoma surgery Right   . Cardiac defibrillator removal  12/17/2012    removed for infection  . Icd lead removal Left 12/17/2012    Procedure: ICD LEAD REMOVAL;  Surgeon: Evans Lance, MD;  Location: [place] [facility];  Service: Cardiovascular;  Laterality: Left;  . Bi-ventricular implantable cardioverter defibrillator  (crt-d)  08/05/2013    STJ Quadra Assura CRTD implanted by Dr Caryl Comes (right sided)  . Esophagogastroduodenoscopy N/A 10/02/2013    Procedure: ESOPHAGOGASTRODUODENOSCOPY (EGD);  Surgeon: Lear Ng, MD;  Location: [facility] ENDOSCOPY;  Service: Endoscopy;  Laterality: N/A;  . Ablation of dysrhythmic focus  11/20/2013    [facility]  . Left heart catheterization with coronary angiogram N/A 02/17/2011    Procedure: LEFT HEART CATHETERIZATION WITH CORONARY ANGIOGRAM;  Surgeon: Peter M Martinique, MD;  Location: [facility] CATH LAB;  Service: Cardiovascular;  Laterality: N/A;  . Implantable cardioverter defibrillator revision N/A 06/07/2012    Procedure: IMPLANTABLE CARDIOVERTER DEFIBRILLATOR REVISION;  Surgeon: Thompson Grayer, MD;  Location: [facility] CATH LAB;  Service: Cardiovascular;  Laterality: N/A;  . Right heart catheterization N/A 06/06/2013    Procedure: RIGHT HEART CATH;  Surgeon: Larey Dresser, MD;  Location: [facility] LAB;  Service: Cardiovascular;  Laterality: N/A;  . Left heart catheterization with coronary angiogram Bilateral 07/06/2013    Procedure: LEFT HEART  CATHETERIZATION WITH CORONARY ANGIOGRAM;  Surgeon: Burnell Blanks, MD;  Location: [facility] CATH LAB;  Service: Cardiovascular;  Laterality: Bilateral;  . Bi-ventricular implantable cardioverter defibrillator N/A 08/05/2013    Procedure: BI-VENTRICULAR IMPLANTABLE CARDIOVERTER DEFIBRILLATOR  (CRT-D);  Surgeon: Deboraha Sprang, MD;  Location: [facility] CATH LAB;  Service: Cardiovascular;  Laterality: N/A;  . Av node ablation N/A 02/05/2014    Procedure: AV NODE ABLATION;  Surgeon: Evans Lance, MD;  Location: [facility] CATH LAB;  Service: Cardiovascular;  Laterality: N/A;  . Joint replacement    . Knee arthroscopy Right 1980's  . Back surgery  X 4  . Coronary angioplasty with stent placement  10/2001; 09/2002; ?date    "I've got a total of 4 or 5" (03/06/2014)  . Av node ablation  02/07/2014  . Colectomy  03/2010    sigmoid left; transverse    Molli Barrows, RD, LDN, [place] Pager [phone] After Hours Pager [phone]

## 2014-03-28 NOTE — Progress Notes (Signed)
A-line came out accidentally, pt claimed that she did not know how it happened, bleeding noted in mod. Amount. manual pressure applied for 15 min, and pressure dressing applied. she requested to have it restarted, PA made aware with order. Resp. therapist made aware. Continue to monitor.

## 2014-03-28 NOTE — Progress Notes (Signed)
Refused blood drawing until A-Line is inserted.

## 2014-03-28 NOTE — ED Notes (Addendum)
Difficult to obtain bp. Pt's mentation is good. Pt with strong radial and pedal pulses. Plan to obtain A-line.

## 2014-03-28 NOTE — Progress Notes (Signed)
Reported by resp. Therapist that pt requested to have the A-_line to be inserted by the MD and have the site numbed out, claimed that A-Line was inserted in the x-ray dept. Yesterday,  PA made aware.

## 2014-03-28 NOTE — Care Management Note (Signed)
    Page 1 of 1   04/01/2014     4:20:06 PM CARE MANAGEMENT NOTE 04/01/2014  Patient:  [name],[name]   Account Number:  [id number]  Date Initiated:  03/28/2014  Documentation initiated by:  Elissa Hefty  Subjective/Objective Assessment:   adm w ch pain     Action/Plan:   lives alone, pcp dr [name]   Anticipated DC Date:  04/02/2014   Anticipated DC Plan:  [place] referral  Clinical Social Worker      DC Planning Services  CM consult      Choice offered to / List presented to:             Status of service:  In process, will continue to follow Medicare Important Message given?  YES (If response is "NO", the following Medicare IM given date fields will be blank) Date Medicare IM given:  04/01/2014 Medicare IM given by:  Marvetta Gibbons Date Additional Medicare IM given:   Additional Medicare IM given by:    Discharge Disposition:    Per UR Regulation:  Reviewed for med. necessity/level of care/duration of stay  If discussed at [place] of Stay Meetings, dates discussed:    Comments:  04/01/14- 1200- Marvetta Gibbons RN, BSN [phone] Spoke with pt at bedside- per conversation pt states that she has [place] at home with [facility] for OT/PT and has both cane and walker- per PT eval recommendation for STSNF- pt agreeable to bed search for STSNF- will notify CSW-  if pt chooses to return home will need resumption orders for [facility]- NCM to follow. per PA- pt potentially ready for d/c in am

## 2014-03-28 NOTE — Progress Notes (Signed)
  This is a follow-up to Dr. Olin Pia note earlier today. Her ICD has been reprogrammed as requested. Repeat labs have been reviewed. K and Mg are now back WNL after supplementation.   K 2.6 -->3.6 Mg 1.3 -->2.0  Will continue to monitor.   SIMMONS, BRITTAINY 03/28/2014

## 2014-03-28 NOTE — Progress Notes (Signed)
As per pt., PA told her that anesthesia is coming anytime to insert  A-Line .

## 2014-03-28 NOTE — Progress Notes (Addendum)
HPI: [name] is a 64 yo woman with CAD s/p multiple PCIs in the past, ICM s/p primary prevention ICD 2006, pAF/AFL/AT s/p PVI with inappropriate ICD discharges, VT with appropriate ICD discharges, and chronic systolic CHF with EF 76-72% who presents to ED as a Code STEMI activated by EMS.  ECG transmitted revealed wide complex tachycardia at a rate of 140-150 bpm with a right superior axis consistent with slow VT.  Upon arrival to ED she was complaining of 6/10 chest pain located substernal, heavy/pressure with associated nausea and vomiting.  Case discussed with interventional cardiologist and code STEMI cancelled as likely VT as cause of CP.  ECG obtained in ED confirmed VT.  No BP obtainable with automatic or manual cuffs but she was alert and oriented throughout.  Amiodarone bolus ordered and contacted [employer] rep for possible ATP through pt's device but would have taken approximately 45 min.  Amio bolus completed, drip started and lidocaine bolus ordered.  ED also ordered tx for hyperkalemia noted upon resulting of initial labs.  Pt converted to AV pacing at 60 bpm shortly after lidocaine bolus given.  Multiple attempts at measuring BP failed despite her feeling better with 2+ pulses bilaterally in radial and DP ateries.  Discussed with ED to place A-line for accurate monitoring and plans to transfer to Step-down unit.    Her initial primary prevention ICD was revised in 2006. She underwent repeat operation by Dr. 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   She was hospitalized 3/15 with A on C systolic CHF attributed in part to atrial tachyarrhythmia which was ultimately treated with amiodarone, after AV ablation/device implantation and primary ablation were considered, ultimately she underwent AV junction ablation by Dr. Elliot Cousin fall 2015.  She has had a few recently hospitalizations again for  ventricular tachycardia; she had ventricular tachycardia below her detection in October. She was hospitalized after for ventricular tachycardia resulting in shock therapy after ATP failed at which time mexiletine was addedto her amiodarone.   Review of Systems:     Cardiac Review of Systems: {Y] = yes [ ]  = no  Chest Pain Jazmín.Cullens    ]  Resting SOB [   ] Exertional SOB  [  ]  Orthopnea [  ]   Pedal Edema [   ]    Palpitations [ Y ] Syncope  [  ]   Presyncope [   ]  General Review of Systems: [Y] = yes [  ]=no Constitional: recent weight change [  ]; anorexia [  ]; fatigue [Y  ]; nausea [  ]; night sweats [  ]; fever [  ]; or chills [  ];                                                                     Dental: poor dentition[  ];   Eye : blurred vision [  ]; diplopia [   ]; vision changes [  ];  Amaurosis fugax[  ]; Resp: cough [  ];  wheezing[  ];  hemoptysis[  ]; shortness of breath[  ]; paroxysmal nocturnal dyspnea[  ]; dyspnea on  exertion[  ]; or orthopnea[  ];  GI:  gallstones[  ], vomiting[Y  ];  dysphagia[  ]; melena[  ];  hematochezia [  ]; heartburn[  ];   GU: kidney stones [  ]; hematuria[  ];   dysuria [  ];  nocturia[  ];               Skin: rash [  ], swelling[  ];, hair loss[  ];  peripheral edema[  ];  or itching[  ]; Musculosketetal: myalgias[  ];  joint swelling[  ];  joint erythema[  ];  joint pain[  ];  back pain[  ];  Heme/Lymph: bruising[  ];  bleeding[  ];  anemia[  ];  Neuro: TIA[  ];  headaches[  ];  stroke[  ];  vertigo[  ];  seizures[  ];   paresthesias[  ];  difficulty walking[  ];  Psych:depression[  ]; anxiety[  ];  Endocrine: diabetes[  ];  thyroid dysfunction[  ];  Other:  Past Medical History  Diagnosis Date  . Spinal stenosis   . Numbness of foot   . Diverticulitis     a. s/p partial colectomy 1/12 with reversal in July 2012.  . Colitis, ischemic   . Ischemic cardiomyopathy     a. Echo 06/16/10: EF 25-30%, anteroseptal and apical hypokinesis, moderate AI,  mild MR.   . LV (left ventricular) mural thrombus   . CKD (chronic kidney disease), stage III   . GERD (gastroesophageal reflux disease)   . Hypertension   . Hypothyroidism   . Hyperlipidemia   . Compression fracture     a. of L2  . 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 2/2 atrial fibrillation with RVR in the context of hypokalemia  . PAF (paroxysmal atrial fibrillation)     a. on Eliquis, digoxin, and amiodarone  . Anemia   . H/O hiatal hernia   . Sinus bradycardia   . Mitral regurgitation   . Chronic systolic CHF (congestive heart failure)   . CAD (coronary artery disease)     a.  Ant MI 8/03 with stenting to LAD;  b. staged PCI w/ DES to OM1 8/03;  c. s/p DES to LAD 7/04;  d.  multiple caths in past (chronically abnl ECG); e. cardiac cath 3/12: LAD stent patent, distal LAD 40-50%, small ostial D1 80%, ostial D2 60%, OM-1 stent patent (20-30%), proximal RCA 50%, prox- mRCA 40-50%; f. cath 2012 nonobs g. cath 06/2013 and 08/2013 for "STEMI" - mild nonobst CAD only.  [name] History of TIA (transient ischemic attack)   . Paroxysmal atrial tachycardia   . Paroxysmal atrial flutter   . Osteoporosis   . Oral thrush     a. By EGD 09/2013.  [name] Antral gastritis     a. By EGD 09/2013 - minimal.  . Ventricular tachycardia     a. 01/16/14 s/p amiodarone loading and ICD reprogramming  . 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 s/p ICD implantation in 2005. b. Gen change 2006. 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 c. s/p CRT-D implantation 07/2013.  [name] Heart murmur   . Myocardial infarction 2000  . History of blood transfusion     "related to knee replacement and ?"  . Stroke     "I've had ~ 4; 3 in left eye and 1 w/ablation in [place]" ; denies residual on 03/04/2014(  .  DJD (degenerative joint disease)   . Arthritis     "all over"    No current facility-administered  medications on file prior to encounter.   Current Outpatient Prescriptions on File Prior to Encounter  Medication Sig Dispense Refill  . acetaminophen (TYLENOL) 325 MG tablet Take 2 tablets (650 mg total) by mouth every 4 (four) hours as needed for headache or mild pain.    [name] amiodarone (PACERONE) 200 MG tablet Take 1 tablet (200 mg total) by mouth daily. 60 tablet 3  . apixaban (ELIQUIS) 5 MG TABS tablet Take 1 tablet (5 mg total) by mouth 2 (two) times daily. 60 tablet 3  . carvedilol (COREG) 3.125 MG tablet Take 2 tablets (6.25 mg total) by mouth 2 (two) times daily with a meal. 60 tablet 6  . clonazePAM (KLONOPIN) 0.5 MG tablet Take 1 tablet (0.5 mg total) by mouth 2 (two) times daily as needed for anxiety. 60 tablet 5  . fluticasone (FLONASE) 50 MCG/ACT nasal spray Place 2 sprays into both nostrils daily as needed for allergies.     . furosemide (LASIX) 40 MG tablet Take 1 tablet (40 mg total) by mouth every other day. 30 tablet 6  . levothyroxine (SYNTHROID, LEVOTHROID) 200 MCG tablet Take 200 mcg by mouth daily before breakfast.     . mexiletine (MEXITIL) 200 MG capsule Take 1 capsule (200 mg total) by mouth every 12 (twelve) hours. 60 capsule 3  . nitroGLYCERIN (NITROSTAT) 0.4 MG SL tablet Place 0.4 mg under the tongue every 5 (five) minutes as needed for chest pain.     [name] omega-3 acid ethyl esters (LOVAZA) 1 G capsule Take 1 g by mouth daily.    . ondansetron (ZOFRAN-ODT) 4 MG disintegrating tablet Take 4 mg by mouth 2 (two) times daily as needed for nausea or vomiting.    [name] oxyCODONE-acetaminophen (PERCOCET/ROXICET) 5-325 MG per tablet Take 1 tablet by mouth every 6 (six) hours as needed for moderate pain.     . pantoprazole (PROTONIX) 40 MG tablet Take 1 tablet (40 mg total) by mouth 2 (two) times daily. 180 tablet 1  . polyethylene glycol (MIRALAX / GLYCOLAX) packet Take 17 g by mouth daily as needed (for constipation).     . raloxifene (EVISTA) 60 MG tablet Take 60 mg by mouth daily.     [name] spironolactone (ALDACTONE) 25 MG tablet Take 0.5 tablets (12.5 mg total) by mouth daily.        Allergies  Allergen Reactions  . Coconut Oil Anaphylaxis and Hives  . Lansoprazole Nausea Only  . Penicillins Swelling    Throat swells  . Statins Other (See Comments)    cramps  . Lisinopril     Cough- but still tolerates it  . Lactose Intolerance (Gi) Diarrhea and Other (See Comments)    Patient reports abdominal cramping and diarrhea with lactose products.     History   Social History  . Marital Status: Widowed    Spouse Name: N/A    Number of Children: N/A  . Years of Education: N/A   Occupational History  . Retired    Social History Main Topics  . Smoking status: Former Smoker -- 0.10 packs/day for 15 years    Types: Cigarettes    Quit date: 04/21/2001  . Smokeless tobacco: Never Used     Comment: "smoked ~ 1 pack/month"  . Alcohol Use: 0.6 oz/week    1 Glasses of wine per week  . Drug Use: No  . Sexual  Activity: Not Currently   Other Topics Concern  . Not on file   Social History Narrative   Lives alone, has a house, has some household help. Handicapped upfitted.    Family History  Problem Relation Age of Onset  . Diabetes Mother   . Diabetes Brother   . Arthritis      family history  . Prostate cancer      Family History    PHYSICAL EXAM: Filed Vitals:   03/28/14 0016  BP:   Pulse: 69  Resp: 13   General:  Well appearing for clinical circumstances. No respiratory difficulty HEENT: normal Neck: supple. Elevated JVP. Carotids 2+ bilat; no bruits. No lymphadenopathy or thryomegaly appreciated. Cor: PMI displaced inferior and laterally.  Tachy but regular. Soft holosystolic murmur with gallop. Lungs: clear Abdomen: soft, nontender, nondistended. No hepatosplenomegaly. No bruits or masses. Good bowel sounds. Extremities: no cyanosis, clubbing, rash, edema Neuro: alert & oriented x 3, cranial nerves grossly intact. moves all 4 extremities w/o  difficulty. Affect pleasant.  ECG:  Wide complex tachycardia with right superior axis c/w VT  Results for orders placed or performed during the hospital encounter of 04/14/2014 (from the past 24 hour(s))  APTT     Status: None   Collection Time: 03/21/2014 10:59 PM  Result Value Ref Range   aPTT 36 24 - 37 seconds  CBC     Status: Abnormal   Collection Time: 03/23/2014 10:59 PM  Result Value Ref Range   WBC 4.2 4.0 - 10.5 K/uL   RBC 4.16 3.87 - 5.11 MIL/uL   Hemoglobin 13.7 12.0 - 15.0 g/dL   HCT 39.9 36.0 - 46.0 %   MCV 95.9 78.0 - 100.0 fL   MCH 32.9 26.0 - 34.0 pg   MCHC 34.3 30.0 - 36.0 g/dL   RDW 16.6 (H) 11.5 - 15.5 %   Platelets 163 150 - 400 K/uL  Comprehensive metabolic panel     Status: Abnormal   Collection Time: 04/06/2014 10:59 PM  Result Value Ref Range   Sodium 134 (L) 135 - 145 mmol/L   Potassium 4.7 3.5 - 5.1 mmol/L   Chloride 100 96 - 112 mEq/L   CO2 19 19 - 32 mmol/L   Glucose, Bld 193 (H) 70 - 99 mg/dL   BUN 14 6 - 23 mg/dL   Creatinine, Ser 1.57 (H) 0.50 - 1.10 mg/dL   Calcium 8.0 (L) 8.4 - 10.5 mg/dL   Total Protein 6.3 6.0 - 8.3 g/dL   Albumin 3.0 (L) 3.5 - 5.2 g/dL   AST 94 (H) 0 - 37 U/L   ALT 36 (H) 0 - 35 U/L   Alkaline Phosphatase 77 39 - 117 U/L   Total Bilirubin 1.3 (H) 0.3 - 1.2 mg/dL   GFR calc non Af Amer 34 (L) >90 mL/min   GFR calc Af Amer 39 (L) >90 mL/min   Anion gap 15 5 - 15  Protime-INR     Status: Abnormal   Collection Time: 04/05/2014 10:59 PM  Result Value Ref Range   Prothrombin Time 21.5 (H) 11.6 - 15.2 seconds   INR 1.85 (H) 0.00 - 1.49  Troponin I     Status: None   Collection Time: 03/28/2014 10:59 PM  Result Value Ref Range   Troponin I <0.03 <0.031 ng/mL  Magnesium     Status: None   Collection Time: 03/29/2014 10:59 PM  Result Value Ref Range   Magnesium 1.5 1.5 - 2.5 mg/dL  Phosphorus  Status: None   Collection Time: 04/19/2014 10:59 PM  Result Value Ref Range   Phosphorus 3.5 2.3 - 4.6 mg/dL  I-stat troponin, ED (not at  [facility])     Status: None   Collection Time: 03/30/2014 11:06 PM  Result Value Ref Range   Troponin i, poc 0.00 0.00 - 0.08 ng/mL   Comment 3          I-Stat Chem 8, ED     Status: Abnormal   Collection Time: 03/23/2014 11:08 PM  Result Value Ref Range   Sodium 133 (L) 135 - 145 mmol/L   Potassium 6.0 (H) 3.5 - 5.1 mmol/L   Chloride 98 96 - 112 mEq/L   BUN 20 6 - 23 mg/dL   Creatinine, Ser 1.40 (H) 0.50 - 1.10 mg/dL   Glucose, Bld 159 (H) 70 - 99 mg/dL   Calcium, Ion 0.89 (L) 1.13 - 1.30 mmol/L   TCO2 22 0 - 100 mmol/L   Hemoglobin 17.3 (H) 12.0 - 15.0 g/dL   HCT 51.0 (H) 36.0 - 46.0 %  I-Stat CG4 Lactic Acid, ED     Status: Abnormal   Collection Time: 04/11/2014 11:08 PM  Result Value Ref Range   Lactic Acid, Venous 3.07 (H) 0.5 - 2.2 mmol/L   Dg Chest Portable 1 View  04/01/2014   CLINICAL DATA:  Chest pain and shortness of breath. Code ST-elevation MI.  EXAM: PORTABLE CHEST - 1 VIEW  COMPARISON:  03/06/2014.  FINDINGS: Right-sided multi lead pacemaker remains in place. Cardiomegaly is unchanged. There is increasing hazy opacity at the left lung base. Inferior-most left costophrenic angle not included in the field of view. Linear atelectasis in the right lower lung zone. Pulmonary vascular congestion, not significantly changed. Kyphoplasty noted in the upper thoracic spine.  IMPRESSION: Increased hazy opacity at the left lung base, likely increasing pleural effusion. Stable vascular congestion and cardiomegaly.   Electronically Signed   By: Jeb Levering M.D.   On: 04/07/2014 23:33     [name] is a 64 yo woman with CAD s/p multiple PCIs in the past, ICM s/p primary prevention ICD 2006, pAF/AFL/AT s/p PVI with inappropriate ICD discharges, VT with appropriate ICD discharges, and chronic systolic CHF with EF 78-29% who presents to ED as a Code STEMI activated by EMS but noted to be in slow VT upon arrival and chemically cardioverted while in ED with amiodarone and  lidocaine.   PLAN/DISCUSSION:  VT:  Now resolved - check and correct electrolytes (Mg 1.5) - continue IV amiodarone drip - continue PO Mexiletine - PRN IV lidocaine - EP to see in AM  CP: likely due to demand ischemia from VT - cycle troponins, initial negative making ACS less likely -  ASA, BB - should be on statin unless precluded by LFTs or other  Possible hypotension: - maintain A-line for monitoring overnight  Hypomagnesemia: - replete  Hyperkalemia: not confident labs were accurate - recheck - correct as needed (goal > 4)  AKI: unclear etiology, likely pre-renal - received > 1L of NS in ED, will not continue fluids - monitor   Lactic Acidosis: likely 2/2 hypoperfusion during sustained VT - monitor  Petrina Melby 1:17 AM 03/28/2014

## 2014-03-28 NOTE — Progress Notes (Signed)
Consent signed for anesthesia-inserted A line. I was told in report that the PA was going to contact Anesthesia to come insert the A line at some point tonight; have not been notified at this time when this will actually take place. Pt informed and will continue to wait and monitor.

## 2014-03-29 DIAGNOSIS — E872 Acidosis: Secondary | ICD-10-CM

## 2014-03-29 LAB — MAGNESIUM: Magnesium: 1.9 mg/dL (ref 1.5–2.5)

## 2014-03-29 LAB — BASIC METABOLIC PANEL
ANION GAP: 9 (ref 5–15)
BUN: 12 mg/dL (ref 6–23)
CALCIUM: 8.7 mg/dL (ref 8.4–10.5)
CO2: 22 mmol/L (ref 19–32)
CREATININE: 1.72 mg/dL — AB (ref 0.50–1.10)
Chloride: 102 mEq/L (ref 96–112)
GFR calc Af Amer: 35 mL/min — ABNORMAL LOW (ref >90)
GFR, EST NON AFRICAN AMERICAN: 30 mL/min — AB (ref >90)
Glucose, Bld: 114 mg/dL — ABNORMAL HIGH (ref 70–99)
POTASSIUM: 3.6 mmol/L (ref 3.5–5.1)
Sodium: 133 mmol/L — ABNORMAL LOW (ref 135–145)

## 2014-03-29 MED ORDER — APIXABAN 2.5 MG PO TABS
2.5000 mg | ORAL_TABLET | Freq: Two times a day (BID) | ORAL | Status: DC
  Administered 2014-03-30 – 2014-04-08 (×19): 2.5 mg via ORAL
  Filled 2014-03-29 (×23): qty 1

## 2014-03-29 NOTE — Progress Notes (Signed)
Transferred to [id number] by wheelchair, stable, report given to RN. Belongings with pt.

## 2014-03-29 NOTE — Progress Notes (Signed)
SUBJECTIVE: The patient is doing well today. Her primary concern is with nausea.  At this time, she denies chest pain, shortness of breath, or any new concerns.  [name] amiodarone  400 mg Oral BID  . apixaban  5 mg Oral BID  . aspirin EC  81 mg Oral Daily  . Buprenorphine  1 patch Transdermal Weekly  . carvedilol  3.125 mg Oral BID WC  . docusate sodium  100 mg Oral BID  . feeding supplement (RESOURCE BREEZE)  1 Container Oral TID BM  . levothyroxine  200 mcg Oral QAC breakfast  . mexiletine  200 mg Oral Q12H  . pantoprazole  40 mg Oral BID  . raloxifene  60 mg Oral Daily  . sodium chloride  3 mL Intravenous Q12H      OBJECTIVE: Physical Exam: Filed Vitals:   03/28/14 2350 03/29/14 0017 03/29/14 0449 03/29/14 0744  BP: 96/54  91/52 108/54  Pulse: 69  70 65  Temp:  97.6 F (36.4 C) 97.8 F (36.6 C) 97.4 F (36.3 C)  TempSrc:  Oral Oral Oral  Resp: 14   13  Height:      Weight:   124 lb 12.5 oz (56.6 kg)   SpO2: 99%  100% 100%    Intake/Output Summary (Last 24 hours) at 03/29/14 1129 Last data filed at 03/29/14 0900  Gross per 24 hour  Intake    340 ml  Output    150 ml  Net    190 ml    Telemetry reveals sinus rhythm, V paced  GEN- The patient is chronically ill appearing, alert and oriented x 3 today.   Head- normocephalic, atraumatic Eyes-  Sclera clear, conjunctiva pink Ears- hearing intact Oropharynx- clear Neck- supple,   Lungs- Clear to ausculation bilaterally, normal work of breathing Heart- Regular rate and rhythm  GI- soft, NT, ND, + BS Extremities- no clubbing, cyanosis, or edema Skin- no rash or lesion Psych- euthymic mood, full affect Neuro- strength and sensation are intact  LABS: Basic Metabolic Panel:  Recent Labs  04/05/2014 2259 03/30/2014 2308 03/28/14 0115 03/28/14 0540  NA 134* 133*  --  134*  K 4.7 6.0* 2.6* 3.6  CL 100 98  --  104  CO2 19  --   --  18*  GLUCOSE 193* 159*  --  96  BUN 14 20  --  11  CREATININE 1.57* 1.40*  --   1.32*  CALCIUM 8.0*  --   --  7.8*  MG 1.5  --  1.3* 2.0  PHOS 3.5  --   --   --    Liver Function Tests:  Recent Labs  03/23/2014 2259  AST 94*  ALT 36*  ALKPHOS 77  BILITOT 1.3*  PROT 6.3  ALBUMIN 3.0*   No results for input(s): LIPASE, AMYLASE in the last 72 hours. CBC:  Recent Labs  04/08/2014 2259 04/12/2014 2308  WBC 4.2  --   HGB 13.7 17.3*  HCT 39.9 51.0*  MCV 95.9  --   PLT 163  --    Cardiac Enzymes:  Recent Labs  04/08/2014 2259 03/28/14 0540 03/28/14 1830  TROPONINI <0.03 <0.03 <0.03   RADIOLOGY: Dg Chest 2 View  03/06/2014   CLINICAL DATA:  Defibrillator activation this morning at 930. Chest pain after defibrillator fired but resolution of chest pain.  EXAM: CHEST  2 VIEW  COMPARISON:  02/05/2014.  FINDINGS: Cardiomegaly. AICD is present with coronary sinus lead. Multilevel vertebral augmentation. Small LEFT  pleural effusion is present. Pulmonary vascular congestion is present which appears more prominent on the most recent comparison chest radiograph of 02/05/2014. Mediastinal contours appear similar. Aortic arch atherosclerosis.  Comparing to multiple priors, the appearance of the cardiopericardial silhouette appears similar.  IMPRESSION: 1. Cardiomegaly and pulmonary vascular congestion. 2. AICD leads appear unchanged. 3. Small LEFT pleural effusion suggesting volume overload.   Electronically Signed   By: Dereck Ligas M.D.   On: 03/06/2014 14:55   Ct Lumbar Spine Wo Contrast  02/27/2014   CLINICAL DATA:  Back and bilateral leg pain and numbness for 1 week. History of lumbar fusion and compression fractures.  EXAM: CT LUMBAR SPINE WITHOUT CONTRAST  TECHNIQUE: Multidetector CT imaging of the lumbar spine was performed without intravenous contrast administration. Multiplanar CT image reconstructions were also generated.  COMPARISON:  09/27/2013 and 10/12/2012  FINDINGS: Stable fusion hardware extending from L3-S1 with pedicle screws and posterior rods. There  also interbody fusion plugs.  Diffuse osteoporosis. There are stable fractures involving L2 and S1. No new/acute fracture. The facets are normally aligned and fused from L3-S1.  No obvious spinal or foraminal stenosis in the lumbar spine. Wide decompressive laminectomies at L3-4, L4-5 and L5-S1.  Small bilateral pleural effusions are noted.  IMPRESSION: Stable lumbar fusion hardware.  Remote L2 fracture.  No acute fractures.  Remote healed sacral fractures.   Electronically Signed   By: Kalman Jewels M.D.   On: 02/27/2014 16:22   Dg Chest Portable 1 View  04/05/2014   CLINICAL DATA:  Chest pain and shortness of breath. Code ST-elevation MI.  EXAM: PORTABLE CHEST - 1 VIEW  COMPARISON:  03/06/2014.  FINDINGS: Right-sided multi lead pacemaker remains in place. Cardiomegaly is unchanged. There is increasing hazy opacity at the left lung base. Inferior-most left costophrenic angle not included in the field of view. Linear atelectasis in the right lower lung zone. Pulmonary vascular congestion, not significantly changed. Kyphoplasty noted in the upper thoracic spine.  IMPRESSION: Increased hazy opacity at the left lung base, likely increasing pleural effusion. Stable vascular congestion and cardiomegaly.   Electronically Signed   By: Jeb Levering M.D.   On: 04/07/2014 23:33    ASSESSMENT AND PLAN:  Active Problems:   Sustained ventricular tachycardia   Chest pain   AKI (acute kidney injury)   Lactic acidosis   Pain in the chest   Hypomagnesemia   Constipated  1. VT Better controlled Continue current regimen  2. Metabolic acidosis, hypomag/ low K No labs today Labs ordered Keep K >3.9 and Mg >1.9  3. Atrial fibrillation Stable Continue eliquis  4. Poor diet/ nausea Likely worsened by amiodarone  Her prognosis is quite poor At some point, palliative measures will need to be considered  Transfer to telemetry General cardiology to follow over the weekend  EP to see again on Monday if  still here  Thompson Grayer, MD 03/29/2014 11:29 AM

## 2014-03-29 NOTE — Progress Notes (Signed)
Refused to eat dinner. Offered soup but refused.

## 2014-03-29 NOTE — Progress Notes (Signed)
Discussed with MD about the needs fo A-Line that pt requested, claimed that there is no need for it. Explained to the pt.

## 2014-03-30 LAB — BASIC METABOLIC PANEL
Anion gap: 8 (ref 5–15)
BUN: 12 mg/dL (ref 6–23)
CO2: 24 mmol/L (ref 19–32)
Calcium: 8.8 mg/dL (ref 8.4–10.5)
Chloride: 102 mEq/L (ref 96–112)
Creatinine, Ser: 1.6 mg/dL — ABNORMAL HIGH (ref 0.50–1.10)
GFR calc Af Amer: 38 mL/min — ABNORMAL LOW (ref >90)
GFR calc non Af Amer: 33 mL/min — ABNORMAL LOW (ref >90)
Glucose, Bld: 113 mg/dL — ABNORMAL HIGH (ref 70–99)
Potassium: 3.8 mmol/L (ref 3.5–5.1)
Sodium: 134 mmol/L — ABNORMAL LOW (ref 135–145)

## 2014-03-30 LAB — MAGNESIUM: MAGNESIUM: 1.8 mg/dL (ref 1.5–2.5)

## 2014-03-30 NOTE — Progress Notes (Signed)
Primary cardiologist: Dr. Minus Breeding EP: Dr. Virl Axe  Seen for followup: Recurrent VT  Subjective:    Patient appears comfortable this morning, no chest pain or palpitations. Feels nauseated with limited appetite.  Objective:   Temp:  [97.2 F (36.2 C)-98.6 F (37 C)] 98.6 F (37 C) (01/10 0511) Pulse Rate:  [65-75] 70 (01/10 0818) Resp:  [15-19] 19 (01/10 0511) BP: (76-98)/(34-60) 86/58 mmHg (01/10 1100) SpO2:  [99 %-100 %] 100 % (01/10 0511) Weight:  [127 lb 14.4 oz (58.015 kg)] 127 lb 14.4 oz (58.015 kg) (01/10 0511) Last BM Date:  ("a week ago")  Filed Weights   04/02/2014 2254 03/29/14 0449 03/30/14 0511  Weight: 120 lb (54.432 kg) 124 lb 12.5 oz (56.6 kg) 127 lb 14.4 oz (58.015 kg)    Intake/Output Summary (Last 24 hours) at 03/30/14 1130 Last data filed at 03/30/14 0900  Gross per 24 hour  Intake    340 ml  Output    550 ml  Net   -210 ml    Telemetry: Paced rhythm, no VT.  Exam:  General: No distress.  Lungs: Clear, nonlabored.  Cardiac: RRR without gallop.  Extremities: No pitting edema.  Lab Results:  Basic Metabolic Panel:  Recent Labs Lab 03/28/14 0540 03/29/14 1338 03/30/14 0346  NA 134* 133* 134*  K 3.6 3.6 3.8  CL 104 102 102  CO2 18* 22 24  GLUCOSE 96 114* 113*  BUN 11 12 12   CREATININE 1.32* 1.72* 1.60*  CALCIUM 7.8* 8.7 8.8  MG 2.0 1.9 1.8    Liver Function Tests:  Recent Labs Lab 04/10/2014 2259  AST 94*  ALT 36*  ALKPHOS 77  BILITOT 1.3*  PROT 6.3  ALBUMIN 3.0*    CBC:  Recent Labs Lab 03/21/2014 2259 04/07/2014 2308  WBC 4.2  --   HGB 13.7 17.3*  HCT 39.9 51.0*  MCV 95.9  --   PLT 163  --     Cardiac Enzymes:  Recent Labs Lab 04/16/2014 2259 03/28/14 0540 03/28/14 1830  TROPONINI <0.03 <0.03 <0.03    BNP:  Recent Labs  06/04/13 0957 06/18/13 1327 02/05/14 2230  PROBNP 2458.0* 1223.0* 5151.0*    Coagulation:  Recent Labs Lab 03/23/2014 2259  INR 1.85*    Medications:     Scheduled Medications: . amiodarone  400 mg Oral BID  . apixaban  2.5 mg Oral BID  . aspirin EC  81 mg Oral Daily  . Buprenorphine  1 patch Transdermal Weekly  . carvedilol  3.125 mg Oral BID WC  . docusate sodium  100 mg Oral BID  . feeding supplement (RESOURCE BREEZE)  1 Container Oral TID BM  . levothyroxine  200 mcg Oral QAC breakfast  . mexiletine  200 mg Oral Q12H  . pantoprazole  40 mg Oral BID  . raloxifene  60 mg Oral Daily  . sodium chloride  3 mL Intravenous Q12H    PRN Medications: [CANCELED] Place/Maintain arterial line **AND** sodium chloride, acetaminophen, clonazePAM, ondansetron (ZOFRAN) IV, polyethylene glycol   Assessment:   1. Recurrent VT, managed by the EP team. Antiarrhythmics as outlined above. He has been clinically stable since transfer to telemetry.  2. Ischemic cardiomyopathy with LVEF 25-30%.  3. Paroxysmal atrial fibrillation, on antiarrhythmic and anticoagulant treatment.  4. Nausea and limited appetite. Patient reports associated constipation. She is on Colace. Question relation to amiodarone. Intake and output are fairly even.  5. Relative hypotension, possibly complicated by problem before limited oral intake.  She is on no anti-hypertensives other than low-dose Coreg.   Plan/Discussion:    Plan to repeat LFTs. Continue current medical regimen including low-dose Coreg. Possibly home in the next 24 hours.  [name], M.D., F.A.C.C.

## 2014-03-30 NOTE — Progress Notes (Signed)
Report given to [place]

## 2014-03-30 NOTE — Progress Notes (Addendum)
Md paged, pt bp 78/34. [name] S 8:25 AM   Return page, MD would like manual recheck in 2mins. 8:30 AM

## 2014-03-31 DIAGNOSIS — Z95810 Presence of automatic (implantable) cardiac defibrillator: Secondary | ICD-10-CM

## 2014-03-31 DIAGNOSIS — E43 Unspecified severe protein-calorie malnutrition: Secondary | ICD-10-CM | POA: Insufficient documentation

## 2014-03-31 DIAGNOSIS — I5032 Chronic diastolic (congestive) heart failure: Secondary | ICD-10-CM

## 2014-03-31 DIAGNOSIS — I481 Persistent atrial fibrillation: Secondary | ICD-10-CM

## 2014-03-31 DIAGNOSIS — I4892 Unspecified atrial flutter: Secondary | ICD-10-CM | POA: Diagnosis not present

## 2014-03-31 DIAGNOSIS — R110 Nausea: Secondary | ICD-10-CM | POA: Diagnosis present

## 2014-03-31 DIAGNOSIS — I129 Hypertensive chronic kidney disease with stage 1 through stage 4 chronic kidney disease, or unspecified chronic kidney disease: Secondary | ICD-10-CM

## 2014-03-31 DIAGNOSIS — I480 Paroxysmal atrial fibrillation: Secondary | ICD-10-CM | POA: Diagnosis not present

## 2014-03-31 DIAGNOSIS — I959 Hypotension, unspecified: Secondary | ICD-10-CM

## 2014-03-31 DIAGNOSIS — R2689 Other abnormalities of gait and mobility: Secondary | ICD-10-CM

## 2014-03-31 DIAGNOSIS — I255 Ischemic cardiomyopathy: Secondary | ICD-10-CM

## 2014-03-31 DIAGNOSIS — K5900 Constipation, unspecified: Secondary | ICD-10-CM

## 2014-03-31 LAB — CBC
HCT: 30.1 % — ABNORMAL LOW (ref 36.0–46.0)
Hemoglobin: 10.5 g/dL — ABNORMAL LOW (ref 12.0–15.0)
MCH: 32.8 pg (ref 26.0–34.0)
MCHC: 34.9 g/dL (ref 30.0–36.0)
MCV: 94.1 fL (ref 78.0–100.0)
PLATELETS: 139 10*3/uL — AB (ref 150–400)
RBC: 3.2 MIL/uL — ABNORMAL LOW (ref 3.87–5.11)
RDW: 16.6 % — ABNORMAL HIGH (ref 11.5–15.5)
WBC: 4.2 10*3/uL (ref 4.0–10.5)

## 2014-03-31 LAB — COMPREHENSIVE METABOLIC PANEL
ALBUMIN: 2.8 g/dL — AB (ref 3.5–5.2)
ALT: 33 U/L (ref 0–35)
AST: 81 U/L — AB (ref 0–37)
Alkaline Phosphatase: 74 U/L (ref 39–117)
Anion gap: 6 (ref 5–15)
BILIRUBIN TOTAL: 1.7 mg/dL — AB (ref 0.3–1.2)
BUN: 15 mg/dL (ref 6–23)
CO2: 21 mmol/L (ref 19–32)
Calcium: 8.2 mg/dL — ABNORMAL LOW (ref 8.4–10.5)
Chloride: 105 mEq/L (ref 96–112)
Creatinine, Ser: 1.59 mg/dL — ABNORMAL HIGH (ref 0.50–1.10)
GFR calc Af Amer: 39 mL/min — ABNORMAL LOW (ref >90)
GFR calc non Af Amer: 33 mL/min — ABNORMAL LOW (ref >90)
GLUCOSE: 95 mg/dL (ref 70–99)
POTASSIUM: 4 mmol/L (ref 3.5–5.1)
Sodium: 132 mmol/L — ABNORMAL LOW (ref 135–145)
TOTAL PROTEIN: 6.4 g/dL (ref 6.0–8.3)

## 2014-03-31 MED ORDER — AMIODARONE HCL 200 MG PO TABS
200.0000 mg | ORAL_TABLET | Freq: Three times a day (TID) | ORAL | Status: DC
  Administered 2014-03-31 – 2014-04-01 (×3): 200 mg via ORAL
  Filled 2014-03-31 (×6): qty 1

## 2014-03-31 NOTE — Evaluation (Signed)
Physical Therapy Evaluation Patient Details Name: [name] MRN: [id number] DOB: 1950-07-03 Today's Date: 03/31/2014   History of Present Illness  [name] is a 64 y.o. female with a history of ICM, chronic systolic heart failure, CAD last intervention 2004,  prior CVA, chronic kidney disease, atrial arrhythmias and recent admission for sustained ventricular tachycardia who presented to [facility] today after an ICD shock  Clinical Impression  Pt very pleasant but currently limited by nausea, diarrhea and abdominal pain. Pt reports she has been living by herself with some assist from neighbors and plans to do the same at D/C. Pt denied need for additional assist or ST-SNF at this time. Unable to fully assess pt mobility and function due to above. Will follow acutely to maximize transfers, gait and function to decrease burden of care. Recommend OOB daily.     Follow Up Recommendations Home health PT;Supervision - Intermittent    Equipment Recommendations  None recommended by PT    Recommendations for Other Services OT consult     Precautions / Restrictions Precautions Precautions: Fall      Mobility  Bed Mobility Overal bed mobility: Modified Independent                Transfers Overall transfer level: Modified independent                  Ambulation/Gait Ambulation/Gait assistance: Supervision Ambulation Distance (Feet): 15 Feet Assistive device:  (pt holding onto environmental supports throughout) Gait Pattern/deviations: Step-to pattern   Gait velocity interpretation: Below normal speed for age/gender General Gait Details: gait limited by nausea and abdominal pain, pt unable to release environmental supports with cues  Stairs            Wheelchair Mobility    Modified Rankin (Stroke Patients Only)       Balance Overall balance assessment: Needs assistance   Sitting balance-Leahy Scale: Good       Standing balance-Leahy Scale: Poor                               Pertinent Vitals/Pain Pain Assessment: 0-10 Pain Score: 4  Pain Location: abdomen Pain Descriptors / Indicators: Cramping Pain Intervention(s): Repositioned  HR 94    Home Living Family/patient expects to be discharged to:: Private residence Living Arrangements: Alone Available Help at Discharge: Neighbor;Friend(s);Available PRN/intermittently Type of Home: House Home Access: Stairs to enter Entrance Stairs-Rails: Left Entrance Stairs-Number of Steps: 3 Home Layout: One level Home Equipment: Cane - single point;Walker - 2 wheels;Grab bars - toilet;Grab bars - tub/shower Additional Comments: walk in shower and elevated toilet    Prior Function Level of Independence: Independent with assistive device(s)         Comments: drives; grocery shops with electric cart; has a housekeeper;      Hand Dominance        Extremity/Trunk Assessment   Upper Extremity Assessment: Generalized weakness           Lower Extremity Assessment: Generalized weakness      Cervical / Trunk Assessment: Normal  Communication   Communication: No difficulties  Cognition Arousal/Alertness: Awake/alert Behavior During Therapy: WFL for tasks assessed/performed Overall Cognitive Status: Within Functional Limits for tasks assessed                      General Comments      Exercises        Assessment/Plan  PT Assessment Patient needs continued PT services  PT Diagnosis Difficulty walking;Generalized weakness   PT Problem List Decreased activity tolerance;Decreased balance;Pain;Decreased knowledge of use of DME  PT Treatment Interventions Gait training;Stair training;Functional mobility training;Therapeutic activities;Therapeutic exercise;Patient/family education;Balance training;DME instruction   PT Goals (Current goals can be found in the Care Plan section) Acute Rehab PT Goals Patient Stated Goal: return home PT Goal Formulation:  With patient Time For Goal Achievement: 04/14/14 Potential to Achieve Goals: Good    Frequency Min 3X/week   Barriers to discharge Decreased caregiver support      Co-evaluation               End of Session   Activity Tolerance: Patient limited by fatigue Patient left: in bed;with call bell/phone within reach Nurse Communication: Mobility status         Time: 1035-1050 PT Time Calculation (min) (ACUTE ONLY): 15 min   Charges:   PT Evaluation $Initial PT Evaluation Tier I: 1 Procedure     PT G CodesMelford Aase 03/31/2014, 11:19 AM [name], [place]

## 2014-03-31 NOTE — Clinical Documentation Improvement (Signed)
Supporting Information:  Labs:  01/07: Lactic acid: 3.07.  BPs: 01/07:  68/48 01/08:  83/49 01/09:  84/47 01/10:  76/40    Possible Clinical Condition: . Bacteremia (positive blood cultures only) . Sepsis with UTI, versus UTI only . Sepsis-specify causative organism if known . Sepsis due to: --Device --Implant --Graft --Infusion --Abortion . Severe sepsis-sepsis with organ dysfunction --Specify organ dysfunction Respiratory failure Encephalopathy Acute kidney failure Other (specify) . SIRS (Systemic Inflammatory Response Syndrome --With or without organ dysfunction . Document septic shock if present . Document any associated diagnoses/conditions    Thank Sherian Maroon Documentation Specialist [phone] Arhan [email or web address]

## 2014-03-31 NOTE — Progress Notes (Signed)
Patient Name: [name]      SUBJECTIVE: admitted with slow VT below detection rate asssoc with LH and nausea Recurrent nausea over weekend   May be related to high dose amio  Or obstipation which was relieved yesterday No inteval VT    Past Medical History  Diagnosis Date  . Spinal stenosis   . Numbness of foot   . Diverticulitis     a. s/p partial colectomy 1/12 with reversal in July 2012.  . Colitis, ischemic   . Ischemic cardiomyopathy     a. Echo 06/16/10: EF 25-30%, anteroseptal and apical hypokinesis, moderate AI, mild MR.   . LV (left ventricular) mural thrombus   . CKD (chronic kidney disease), stage III   . GERD (gastroesophageal reflux disease)   . Hypertension   . Hypothyroidism   . Hyperlipidemia   . Compression fracture     a. of L2  . 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 2/2 atrial fibrillation with RVR in the context of hypokalemia  . PAF (paroxysmal atrial fibrillation)     a. on Eliquis, digoxin, and amiodarone  . Anemia   . H/O hiatal hernia   . Sinus bradycardia   . Mitral regurgitation   . Chronic systolic CHF (congestive heart failure)   . CAD (coronary artery disease)     a.  Ant MI 8/03 with stenting to LAD;  b. staged PCI w/ DES to OM1 8/03;  c. s/p DES to LAD 7/04;  d.  multiple caths in past (chronically abnl ECG); e. cardiac cath 3/12: LAD stent patent, distal LAD 40-50%, small ostial D1 80%, ostial D2 60%, OM-1 stent patent (20-30%), proximal RCA 50%, prox- mRCA 40-50%; f. cath 2012 nonobs g. cath 06/2013 and 08/2013 for "STEMI" - mild nonobst CAD only.  [name] History of TIA (transient ischemic attack)   . Paroxysmal atrial tachycardia   . Paroxysmal atrial flutter   . Osteoporosis   . Oral thrush     a. By EGD 09/2013.  [name] Antral gastritis     a. By EGD 09/2013 - minimal.  . Ventricular tachycardia     a. 01/16/14 s/p amiodarone loading and ICD reprogramming  . Automatic implantable  cardioverter-defibrillator in situ 08/05/2013    a. s/p ICD implantation in 2005. b. Gen change 2006. 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 c. s/p CRT-D implantation 07/2013.  [name] Heart murmur   . Myocardial infarction 2000  . History of blood transfusion     "related to knee replacement and ?"  . Stroke     "I've had ~ 4; 3 in left eye and 1 w/ablation in [place]" ; denies residual on 03/04/2014(  . DJD (degenerative joint disease)   . Arthritis     "all over"    Scheduled Meds:  Scheduled Meds: . amiodarone  400 mg Oral BID  . apixaban  2.5 mg Oral BID  . aspirin EC  81 mg Oral Daily  . Buprenorphine  1 patch Transdermal Weekly  . carvedilol  3.125 mg Oral BID WC  . docusate sodium  100 mg Oral BID  . feeding supplement (RESOURCE BREEZE)  1 Container Oral TID BM  . levothyroxine  200 mcg Oral QAC breakfast  . mexiletine  200 mg Oral Q12H  . pantoprazole  40 mg Oral BID  . raloxifene  60 mg Oral Daily  . sodium chloride  3 mL Intravenous Q12H   Continuous Infusions:   [CANCELED] Place/Maintain arterial line **AND** sodium chloride, acetaminophen, clonazePAM, ondansetron (ZOFRAN) IV, polyethylene glycol    PHYSICAL EXAM Filed Vitals:   03/30/14 1100 03/30/14 1456 03/30/14 2017 03/31/14 0537  BP: 86/58 94/55 92/52  104/48  Pulse:  69 71 69  Temp:  98.4 F (36.9 C) 97.9 F (36.6 C) 98.5 F (36.9 C)  TempSrc:  Oral Oral Oral  Resp:  18 19 20   Height:      Weight:    123 lb 8 oz (56.019 kg)  SpO2:  100% 99% 100%    Well developed and nourished in no acute distress HENT normal Neck supple with JVP-flat Clear Regular rate and rhythm, no murmurs or gallops Abd-soft with active BS No Clubbing cyanosis edema Skin-warm and dry A & Oriented  Grossly normal sensory and motor function  TELEMETRY: Reviewed telemetry pt in AV pacing    Intake/Output Summary (Last 24 hours) at 03/31/14 1001 Last data filed at 03/31/14  0500  Gross per 24 hour  Intake      0 ml  Output    552 ml  Net   -552 ml    LABS: Basic Metabolic Panel:  Recent Labs Lab 04/19/2014 2259 04/15/2014 2308 03/28/14 0115 03/28/14 0540 03/29/14 1338 03/30/14 0346 03/31/14 0319  NA 134* 133*  --  134* 133* 134* 132*  K 4.7 6.0* 2.6* 3.6 3.6 3.8 4.0  CL 100 98  --  104 102 102 105  CO2 19  --   --  18* 22 24 21   GLUCOSE 193* 159*  --  96 114* 113* 95  BUN 14 20  --  11 12 12 15   CREATININE 1.57* 1.40*  --  1.32* 1.72* 1.60* 1.59*  CALCIUM 8.0*  --   --  7.8* 8.7 8.8 8.2*  MG 1.5  --  1.3* 2.0 1.9 1.8  --   PHOS 3.5  --   --   --   --   --   --    Cardiac Enzymes:  Recent Labs  03/28/14 1830  TROPONINI <0.03   CBC:  Recent Labs Lab 03/28/2014 2259 03/30/2014 2308 03/31/14 0319  WBC 4.2  --  4.2  HGB 13.7 17.3* 10.5*  HCT 39.9 51.0* 30.1*  MCV 95.9  --  94.1  PLT 163  --  139*   PROTIME: No results for input(s): LABPROT, INR in the last 72 hours. Liver Function Tests:  Recent Labs  03/31/14 0319  AST 81*  ALT 33  ALKPHOS 74  BILITOT 1.7*  PROT 6.4  ALBUMIN 2.8*   No results for input(s): LIPASE, AMYLASE in the last 72 hours. BNP: BNP (last 3 results)  Recent Labs  06/04/13 0957 06/18/13 1327 02/05/14 2230  PROBNP 2458.0* 1223.0* 5151.0*    Device Interrogation:    ASSESSMENT AND PLAN:  Active Problems:   Ischemic cardiomyopathy   AICD (automatic cardioverter/defibrillator) present   S/P AV nodal ablation, 02/05/14   Sustained ventricular tachycardia   Complete heart block   Persistent atrial fibrillation   Constipated   Protein-calorie malnutrition, severe   Nausea   Hypotension (arterial)   ryhthm stable Will decrease amio to help abet resolution of nausea Ambulate Renal function stable On minimal Afterload so will just observer hypotension  Hopefully home in am   Signed, Virl Axe MD  03/31/2014

## 2014-04-01 ENCOUNTER — Inpatient Hospital Stay (HOSPITAL_COMMUNITY): Payer: Medicare Other

## 2014-04-01 DIAGNOSIS — R110 Nausea: Secondary | ICD-10-CM

## 2014-04-01 DIAGNOSIS — Z9889 Other specified postprocedural states: Secondary | ICD-10-CM

## 2014-04-01 DIAGNOSIS — I255 Ischemic cardiomyopathy: Secondary | ICD-10-CM

## 2014-04-01 MED ORDER — AMIODARONE HCL 200 MG PO TABS
200.0000 mg | ORAL_TABLET | Freq: Every day | ORAL | Status: DC
  Administered 2014-04-03 – 2014-04-08 (×6): 200 mg via ORAL
  Filled 2014-04-01 (×8): qty 1

## 2014-04-01 MED ORDER — SUCRALFATE 1 GM/10ML PO SUSP
1.0000 g | Freq: Three times a day (TID) | ORAL | Status: DC
  Administered 2014-04-01 – 2014-04-08 (×25): 1 g via ORAL
  Filled 2014-04-01 (×34): qty 10

## 2014-04-01 NOTE — Progress Notes (Signed)
Patient Name: [name] Date of Encounter: 04/01/2014  Primary cardiologist: Dr. Minus Breeding EP: Dr. Virl Axe  Seen for followup: Recurrent VT   Active Problems:   Ischemic cardiomyopathy   AICD (automatic cardioverter/defibrillator) present   S/P AV nodal ablation, 02/05/14   Sustained ventricular tachycardia   Complete heart block   Persistent atrial fibrillation   Constipated   Protein-calorie malnutrition, severe   Nausea   Hypotension (arterial)    SUBJECTIVE  Feels bad, continue to have significant nausea and some vomiting. No CP or SOB  CURRENT MEDS . amiodarone  200 mg Oral Q8H  . apixaban  2.5 mg Oral BID  . aspirin EC  81 mg Oral Daily  . Buprenorphine  1 patch Transdermal Weekly  . carvedilol  3.125 mg Oral BID WC  . docusate sodium  100 mg Oral BID  . feeding supplement (RESOURCE BREEZE)  1 Container Oral TID BM  . levothyroxine  200 mcg Oral QAC breakfast  . mexiletine  200 mg Oral Q12H  . pantoprazole  40 mg Oral BID  . raloxifene  60 mg Oral Daily  . sodium chloride  3 mL Intravenous Q12H    OBJECTIVE  Filed Vitals:   03/31/14 1700 03/31/14 2001 04/01/14 0007 04/01/14 0500  BP: 99/54 90/54 111/54 107/49  Pulse: 70 74 70 69  Temp:  98.1 F (36.7 C)  98.8 F (37.1 C)  TempSrc:  Oral  Oral  Resp:  19  20  Height:      Weight:    123 lb 14.4 oz (56.201 kg)  SpO2:  100%  100%    Intake/Output Summary (Last 24 hours) at 04/01/14 1109 Last data filed at 04/01/14 0800  Gross per 24 hour  Intake    360 ml  Output    700 ml  Net   -340 ml   Filed Weights   03/30/14 0511 03/31/14 0537 04/01/14 0500  Weight: 127 lb 14.4 oz (58.015 kg) 123 lb 8 oz (56.019 kg) 123 lb 14.4 oz (56.201 kg)    PHYSICAL EXAM  General: Pleasant, NAD. Neuro: Alert and oriented X 3. Moves all extremities spontaneously. Psych: Normal affect. HEENT:  Normal  Neck: Supple without bruits or JVD. Lungs:  Resp regular and unlabored, CTA. Heart: RRR no s3,  s4. 1/6 systolic murmur Abdomen: Soft, non-tender, non-distended, BS + x 4.  Extremities: No clubbing, cyanosis or edema. DP/PT/Radials 2+ and equal bilaterally.  Accessory Clinical Findings  CBC  Recent Labs  03/31/14 0319  WBC 4.2  HGB 10.5*  HCT 30.1*  MCV 94.1  PLT 973*   Basic Metabolic Panel  Recent Labs  03/29/14 1338 03/30/14 0346 03/31/14 0319  NA 133* 134* 132*  K 3.6 3.8 4.0  CL 102 102 105  CO2 22 24 21   GLUCOSE 114* 113* 95  BUN 12 12 15   CREATININE 1.72* 1.60* 1.59*  CALCIUM 8.7 8.8 8.2*  MG 1.9 1.8  --    Liver Function Tests  Recent Labs  03/31/14 0319  AST 81*  ALT 33  ALKPHOS 74  BILITOT 1.7*  PROT 6.4  ALBUMIN 2.8*    TELE Paced rhyhm    ECG  No new EKG  Echocardiogram 08/06/2013  LV EF: 15% -  20%  ------------------------------------------------------------------- History:  PMH: Post ICD placement. Rule out pericardial effusion. Rule out tamponade. Coronary artery disease. Congestive heart failure. PMH:  Myocardial infarction. Risk factors: Hypertension. Dyslipidemia.  ------------------------------------------------------------------- Study Conclusions  - Left ventricle:  The cavity size was mildly dilated. Wall thickness was normal. Systolic function was severely reduced. The estimated ejection fraction was in the range of 20% to 25%. There is akinesis of the mid-apicalanteroseptal and inferior myocardium. Doppler parameters are consistent with abnormal left ventricular relaxation (grade 1 diastolic dysfunction). - Aortic valve: There was trivial regurgitation. - Mitral valve: Calcified annulus. There was mild regurgitation. - Right ventricle: The cavity size was mildly dilated. Wall thickness was normal.    Radiology/Studies  Dg Chest 2 View  03/06/2014   CLINICAL DATA:  Defibrillator activation this morning at 930. Chest pain after defibrillator fired but resolution of chest pain.  EXAM:  CHEST  2 VIEW  COMPARISON:  02/05/2014.  FINDINGS: Cardiomegaly. AICD is present with coronary sinus lead. Multilevel vertebral augmentation. Small LEFT pleural effusion is present. Pulmonary vascular congestion is present which appears more prominent on the most recent comparison chest radiograph of 02/05/2014. Mediastinal contours appear similar. Aortic arch atherosclerosis.  Comparing to multiple priors, the appearance of the cardiopericardial silhouette appears similar.  IMPRESSION: 1. Cardiomegaly and pulmonary vascular congestion. 2. AICD leads appear unchanged. 3. Small LEFT pleural effusion suggesting volume overload.   Electronically Signed   By: Dereck Ligas M.D.   On: 03/06/2014 14:55   Dg Chest Portable 1 View  04/11/2014   CLINICAL DATA:  Chest pain and shortness of breath. Code ST-elevation MI.  EXAM: PORTABLE CHEST - 1 VIEW  COMPARISON:  03/06/2014.  FINDINGS: Right-sided multi lead pacemaker remains in place. Cardiomegaly is unchanged. There is increasing hazy opacity at the left lung base. Inferior-most left costophrenic angle not included in the field of view. Linear atelectasis in the right lower lung zone. Pulmonary vascular congestion, not significantly changed. Kyphoplasty noted in the upper thoracic spine.  IMPRESSION: Increased hazy opacity at the left lung base, likely increasing pleural effusion. Stable vascular congestion and cardiomegaly.   Electronically Signed   By: Jeb Levering M.D.   On: 04/12/2014 23:33    ASSESSMENT AND PLAN  1. Slow VT  - initially arrived at code STEMI, which was cancelled  - ICD reprogram. K and Mg repleted. No further issue   2. H/o CAD s/p multiple PCI   3. ICM s/p primary prevention ICD 2006 4. Chronic systolic HF with EF 16-10% 5. Atrial fibrillation: continue eliquis 6. Nausea  - unclear if related to amiodarone and mexiletine, interestingly his amio was increased 2 weeks ago and his mexiletine was added around same time, however her  nausea only started last Thur after taking amio and mexiletine for 1.5 weeks  - continue to have significant nausea. States she follows up with [facility], and wonders about whether she need inpatient GI consult  Disposition: per pt, she was told she will go to CIR, however based on PT recommendation, will plan for SNF.   Hilbert Corrigan PA-C Pager: [phone]  Personally seen and examined. Agree with above. Still with significant nausea. Can not hold crackers down.  Will decrease amiodarone to 200 mg QD. Likely culprit of nausea. Will need to watch for possible return of VT. Also on Mexieitine. Will ask GI to see since this has been going on for several days to see if they have other thoughts. She did just have BM. Ordering Abd XRAY.   Candee Furbish, MD

## 2014-04-01 NOTE — Clinical Social Work Placement (Signed)
Clinical Social Work Department CLINICAL SOCIAL WORK PLACEMENT NOTE 04/01/2014  Patient:  [name],[name]  Account Number:  [id number] Admit date:  04/01/2014  Clinical Social Worker:  Domenica Reamer, CLINICAL SOCIAL WORKER  Date/time:  04/01/2014 03:42 PM  Clinical Social Work is seeking post-discharge placement for this patient at the following level of care:   SKILLED NURSING   (*CSW will update this form in Epic as items are completed)   04/01/2014  Patient/family provided with [facility] Department of Clinical Social Work's list of facilities offering this level of care within the geographic area requested by the patient (or if unable, by the patient's family).  04/01/2014  Patient/family informed of their freedom to choose among providers that offer the needed level of care, that participate in Medicare, Medicaid or managed care program needed by the patient, have an available bed and are willing to accept the patient.  04/01/2014  Patient/family informed of [facility]' ownership interest in [facility], as well as of the fact that they are under no obligation to receive care at this facility.  PASARR submitted to EDS on 04/01/2014 PASARR number received on 04/01/2014  FL2 transmitted to all facilities in geographic area requested by pt/family on  04/01/2014 FL2 transmitted to all facilities within larger geographic area on   Patient informed that his/her managed care company has contracts with or will negotiate with  certain facilities, including the following:     Patient/family informed of bed offers received:   Patient chooses bed at  Physician recommends and patient chooses bed at    Patient to be transferred to  on   Patient to be transferred to facility by  Patient and family notified of transfer on  Name of family member notified:    The following physician request were entered in Epic:   Additional Comments: Domenica Reamer, [place] Social  Worker [phone]

## 2014-04-01 NOTE — Progress Notes (Signed)
Spoke with [facility], who will see the patient this afternoon or tonight. Appreciate any recommendation. If no further workup, likely discharge to SNF tomorrow.   Hilbert Corrigan PA Pager: [phone]

## 2014-04-01 NOTE — Progress Notes (Signed)
Physical Therapy Treatment Patient Details Name: [name] MRN: [id number] DOB: 10/01/1950 Today's Date: 04/01/2014    History of Present Illness [name] is a 64 y.o. female with a history of ICM, chronic systolic heart failure, CAD last intervention 2004,  prior CVA, chronic kidney disease, atrial arrhythmias and recent admission for sustained ventricular tachycardia who presented to [facility] today after an ICD shock    PT Comments    Pt progressing with limited mobility able to tolerate short bouts of gait and HEP today. Seated rests between HEP and gait trials. Pt with heaving and spitting during session but no emesis reporting nausea. Pt today agreeable to ST-SNF and states she agrees that she is fatigued and unable to care for herself. Will continue to follow acutely with recommendation for assist at D/C.   Follow Up Recommendations  SNF;Supervision for mobility/OOB     Equipment Recommendations  None recommended by PT    Recommendations for Other Services       Precautions / Restrictions Precautions Precautions: Fall Restrictions Weight Bearing Restrictions: No    Mobility  Bed Mobility Overal bed mobility: Modified Independent                Transfers Overall transfer level: Needs assistance   Transfers: Sit to/from Stand Sit to Stand: Supervision         General transfer comment: cues for hand placement  Ambulation/Gait Ambulation/Gait assistance: Min guard Ambulation Distance (Feet): 20 Feet (x 2 trials with seated rest between trials) Assistive device: 1 person hand held assist;None Gait Pattern/deviations: Trunk flexed;Shuffle   Gait velocity interpretation: Below normal speed for age/gender General Gait Details: first trial pt holding onto furniture despite cues and second hand held assist. Pt with limited activity tolerance due to fatigue and nausea   Stairs            Wheelchair Mobility    Modified Rankin (Stroke Patients  Only)       Balance Overall balance assessment: Needs assistance   Sitting balance-Leahy Scale: Good       Standing balance-Leahy Scale: Fair                      Cognition Arousal/Alertness: Awake/alert Behavior During Therapy: WFL for tasks assessed/performed Overall Cognitive Status: Within Functional Limits for tasks assessed                      Exercises General Exercises - Lower Extremity Long Arc Quad: AROM;Seated;Both;20 reps Hip Flexion/Marching: AROM;Seated;Both;20 reps Toe Raises: AROM;Seated;Both;20 reps Heel Raises: AROM;Seated;Both;20 reps    General Comments        Pertinent Vitals/Pain Pain Assessment: No/denies pain    Home Living                      Prior Function            PT Goals (current goals can now be found in the care plan section) Progress towards PT goals: Progressing toward goals (slowly)    Frequency       PT Plan Discharge plan needs to be updated    Co-evaluation             End of Session   Activity Tolerance: Patient limited by fatigue Patient left: in chair;with call bell/phone within reach;with chair alarm set     Time: 1042-1105 PT Time Calculation (min) (ACUTE ONLY): 23 min  Charges:  $Gait Training: 8-22 mins $  Therapeutic Exercise: 8-22 mins                    G Codes:      [name] 2014-04-11, 11:06 AM [name], [place]

## 2014-04-01 NOTE — Progress Notes (Signed)
Medicare Important Message given? YES  (If response is "NO", the following Medicare IM given date fields will be blank)  Date Medicare IM given: 04/01/14 Medicare IM given by:  Dahlia Client [employer]

## 2014-04-01 NOTE — Clinical Social Work Psychosocial (Signed)
Clinical Social Work Department BRIEF PSYCHOSOCIAL ASSESSMENT 04/01/2014  Patient:  [name],[name]     Account Number:  [id number]     Admit date:  04/19/2014  Clinical Social Worker:  Domenica Reamer, [place]  Date/Time:  04/01/2014 03:39 PM  Referred by:  Physician  Date Referred:  04/01/2014 Referred for  SNF Placement   Other Referral:   Interview type:  Patient Other interview type:    PSYCHOSOCIAL DATA Living Status:  ALONE Admitted from facility:   Level of care:   Primary support name:  Stevphen Rochester Primary support relationship to patient:  SIBLING Degree of support available:   Patient reports high level of emotional support    CURRENT CONCERNS Current Concerns  Post-Acute Placement   Other Concerns:    SOCIAL WORK ASSESSMENT / PLAN CSW spoke with patient concerning PT recommendation for SNF.  Patient is agreeable to SNF search.  Patient states that she has been to inpatinet rehab before at [facility] but has never been to SNF.  CSW will continue to follow   Assessment/plan status:  Psychosocial Support/Ongoing Assessment of Needs Other assessment/ plan:   FL2  PASAR   Information/referral to community resources:   [place] [facility]    PATIENT'S/FAMILY'S RESPONSE TO PLAN OF CARE: Patient is agreeable to plan for short term rehab but is hopeful that she will be able to return to independent living soon.       Domenica Reamer, [place] Social Worker [phone]

## 2014-04-01 NOTE — Consult Note (Signed)
Referring Provider: Dr. Candee Furbish Primary Care Physician:  [name], MD Primary Gastroenterologist:  Dr. Wilford Corner  Reason for Consultation:  Nausea  HPI: [name] is a 64 y.o. female with a prior history of reflux symptoms, well controlled on PPI therapy. She was admitted to the hospital about 5 days ago with poorly controlled ventricular tachycardia, despite an implantable defibrillator.   We're seeing her because of refractory nausea, without vomiting, which began approximately a week or 10 days after an increase in her amiodarone dose, and initiation of mexiletine.   Of note, the patient underwent endoscopic evaluation last July because of nausea and vomiting at that time, and the exam was essentially negative other than some mild antral gastritis.   She states that at this time, her nausea is made worse by consumption of food. She does better with liquids. Zofran brings about transient relief.  Today, her amiodarone dose was decreased to 200 mg daily.  This morning, she finally had a large formed bowel movement after having had some loose stools, raising the question of "overflow" diarrhea. A KUB obtained today does not show any evidence of fecal obstipation at this time.   Past Medical History  Diagnosis Date  . Spinal stenosis   . Numbness of foot   . Diverticulitis     a. s/p partial colectomy 1/12 with reversal in July 2012.  . Colitis, ischemic   . Ischemic cardiomyopathy     a. Echo 06/16/10: EF 25-30%, anteroseptal and apical hypokinesis, moderate AI, mild MR.   . LV (left ventricular) mural thrombus   . CKD (chronic kidney disease), stage III   . GERD (gastroesophageal reflux disease)   . Hypertension   . Hypothyroidism   . Hyperlipidemia   . Compression fracture     a. of L2  . 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 2/2 atrial fibrillation with RVR in the context of hypokalemia  . PAF (paroxysmal atrial  fibrillation)     a. on Eliquis, digoxin, and amiodarone  . Anemia   . H/O hiatal hernia   . Sinus bradycardia   . Mitral regurgitation   . Chronic systolic CHF (congestive heart failure)   . CAD (coronary artery disease)     a.  Ant MI 8/03 with stenting to LAD;  b. staged PCI w/ DES to OM1 8/03;  c. s/p DES to LAD 7/04;  d.  multiple caths in past (chronically abnl ECG); e. cardiac cath 3/12: LAD stent patent, distal LAD 40-50%, small ostial D1 80%, ostial D2 60%, OM-1 stent patent (20-30%), proximal RCA 50%, prox- mRCA 40-50%; f. cath 2012 nonobs g. cath 06/2013 and 08/2013 for "STEMI" - mild nonobst CAD only.  [name] History of TIA (transient ischemic attack)   . Paroxysmal atrial tachycardia   . Paroxysmal atrial flutter   . Osteoporosis   . Oral thrush     a. By EGD 09/2013.  [name] Antral gastritis     a. By EGD 09/2013 - minimal.  . Ventricular tachycardia     a. 01/16/14 s/p amiodarone loading and ICD reprogramming  . 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 s/p ICD implantation in 2005. b. Gen change 2006. 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 c. s/p CRT-D implantation 07/2013.  [name] Heart murmur   . Myocardial infarction 2000  . History of blood transfusion     "related to knee replacement  and ?"  . Stroke     "I've had ~ 4; 3 in left eye and 1 w/ablation in [place]" ; denies residual on 03/04/2014(  . DJD (degenerative joint disease)   . Arthritis     "all over"    Past Surgical History  Procedure Laterality Date  . Cardiac defibrillator placement  07/2003; 10/2004; 2014  . Lumbar laminectomy/decompression microdiscectomy  10/2001    L5-S1/E-chart  . Knee arthroscopy Right   . Thyroidectomy  1970's  . Total knee arthroplasty Left 11/2003  . X-stop implantation  12/2004    L3-4; L4-5  . Fixation kyphoplasty thoracic spine  07/2007    T3, 4, 6 compression fractures  . Shoulder open rotator cuff repair  Right 04/2008  . Lumbar laminectomy/decompression microdiscectomy  01/2010  . Peripherally inserted central catheter insertion  03/2010    removed upon discharge  . Colostomy reversal  12/2010  . Tonsillectomy and adenoidectomy  1969  . Appendectomy  03/2010  . Glaucoma surgery Right   . Cardiac defibrillator removal  12/17/2012    removed for infection  . Icd lead removal Left 12/17/2012    Procedure: ICD LEAD REMOVAL;  Surgeon: Evans Lance, MD;  Location: [place];  Service: Cardiovascular;  Laterality: Left;  . Bi-ventricular implantable cardioverter defibrillator  (crt-d)  08/05/2013    STJ Quadra Assura CRTD implanted by Dr Caryl Comes (right sided)  . Esophagogastroduodenoscopy N/A 10/02/2013    Procedure: ESOPHAGOGASTRODUODENOSCOPY (EGD);  Surgeon: Lear Ng, MD;  Location: [facility] ENDOSCOPY;  Service: Endoscopy;  Laterality: N/A;  . Ablation of dysrhythmic focus  11/20/2013    [facility]  . Left heart catheterization with coronary angiogram N/A 02/17/2011    Procedure: LEFT HEART CATHETERIZATION WITH CORONARY ANGIOGRAM;  Surgeon: Peter M Martinique, MD;  Location: [facility] CATH LAB;  Service: Cardiovascular;  Laterality: N/A;  . Implantable cardioverter defibrillator revision N/A 06/07/2012    Procedure: IMPLANTABLE CARDIOVERTER DEFIBRILLATOR REVISION;  Surgeon: Thompson Grayer, MD;  Location: [facility] CATH LAB;  Service: Cardiovascular;  Laterality: N/A;  . Right heart catheterization N/A 06/06/2013    Procedure: RIGHT HEART CATH;  Surgeon: Larey Dresser, MD;  Location: [facility] CATH LAB;  Service: Cardiovascular;  Laterality: N/A;  . Left heart catheterization with coronary angiogram Bilateral 07/06/2013    Procedure: LEFT HEART CATHETERIZATION WITH CORONARY ANGIOGRAM;  Surgeon: Burnell Blanks, MD;  Location: [facility] CATH LAB;  Service: Cardiovascular;  Laterality: Bilateral;  . Bi-ventricular implantable cardioverter defibrillator N/A 08/05/2013    Procedure: BI-VENTRICULAR IMPLANTABLE CARDIOVERTER  DEFIBRILLATOR  (CRT-D);  Surgeon: Deboraha Sprang, MD;  Location: [facility] CATH LAB;  Service: Cardiovascular;  Laterality: N/A;  . Av node ablation N/A 02/05/2014    Procedure: AV NODE ABLATION;  Surgeon: Evans Lance, MD;  Location[facility] CATH LAB;  Service: Cardiovascular;  Laterality: N/A;  . Joint replacement    . Knee arthroscopy Right 1980's  . Back surgery  X 4  . Coronary angioplasty with stent placement  10/2001; 09/2002; ?date    "I've got a total of 4 or 5" (03/06/2014)  . Av node ablation  02/07/2014  . Colectomy  03/2010    sigmoid left; transverse    Prior to Admission medications   Medication Sig Start Date End Date Taking? Authorizing Provider  acetaminophen (TYLENOL) 325 MG tablet Take 2 tablets (650 mg total) by mouth every 4 (four) hours as needed for headache or mild pain. 02/07/14  Yes Isaiah Serge, NP  amiodarone (PACERONE) 200 MG tablet  Take 1 tablet (200 mg total) by mouth daily. 03/07/14  Yes Thompson Grayer, MD  apixaban (ELIQUIS) 5 MG TABS tablet Take 1 tablet (5 mg total) by mouth 2 (two) times daily. 09/02/13  Yes Jolaine Artist, MD  Buprenorphine (BUTRANS) 7.5 MCG/HR PTWK Place 1 patch onto the skin once a week.   Yes Historical Provider, MD  carvedilol (COREG) 3.125 MG tablet Take 2 tablets (6.25 mg total) by mouth 2 (two) times daily with a meal. Patient taking differently: Take 3.125 mg by mouth 2 (two) times daily with a meal.  03/07/14  Yes Thompson Grayer, MD  clonazePAM (KLONOPIN) 0.5 MG tablet Take 1 tablet (0.5 mg total) by mouth 2 (two) times daily as needed for anxiety. 09/16/13  Yes Janith Lima, MD  fluticasone (FLONASE) 50 MCG/ACT nasal spray Place 2 sprays into both nostrils daily as needed for allergies.    Yes Historical Provider, MD  furosemide (LASIX) 40 MG tablet Take 1 tablet (40 mg total) by mouth every other day. 02/07/14  Yes Isaiah Serge, NP  levothyroxine (SYNTHROID, LEVOTHROID) 200 MCG tablet Take 200 mcg by mouth daily before breakfast.    Yes  Historical Provider, MD  mexiletine (MEXITIL) 200 MG capsule Take 1 capsule (200 mg total) by mouth every 12 (twelve) hours. 03/07/14  Yes Thompson Grayer, MD  nitroGLYCERIN (NITROSTAT) 0.4 MG SL tablet Place 0.4 mg under the tongue every 5 (five) minutes as needed for chest pain.    Yes Historical Provider, MD  omega-3 acid ethyl esters (LOVAZA) 1 G capsule Take 1 g by mouth daily.   Yes Historical Provider, MD  ondansetron (ZOFRAN-ODT) 4 MG disintegrating tablet Take 4 mg by mouth 2 (two) times daily as needed for nausea or vomiting.   Yes Historical Provider, MD  oxyCODONE-acetaminophen (PERCOCET/ROXICET) 5-325 MG per tablet Take 1 tablet by mouth every 6 (six) hours as needed for moderate pain.  02/24/14  Yes Historical Provider, MD  pantoprazole (PROTONIX) 40 MG tablet Take 1 tablet (40 mg total) by mouth 2 (two) times daily. 07/12/13  Yes Janith Lima, MD  polyethylene glycol [facility] / GLYCOLAX) packet Take 17 g by mouth daily as needed (for constipation).    Yes Historical Provider, MD  raloxifene (EVISTA) 60 MG tablet Take 60 mg by mouth daily.   Yes Historical Provider, MD  spironolactone (ALDACTONE) 25 MG tablet Take 0.5 tablets (12.5 mg total) by mouth daily. 02/07/14  Yes Isaiah Serge, NP    Current Facility-Administered Medications  Medication Dose Route Frequency Provider Last Rate Last Dose  . 0.9 %  sodium chloride infusion   Intra-arterial PRN Brittainy Erie Noe, PA-C      . acetaminophen (TYLENOL) tablet 650 mg  650 mg Oral Q4H PRN Alphia Moh, MD      . Derrill Memo ON 04/02/2014] amiodarone (PACERONE) tablet 200 mg  200 mg Oral Daily Candee Furbish, MD      . apixaban Arne Cleveland) tablet 2.5 mg  2.5 mg Oral BID Romona Curls, RPH   2.5 mg at 04/01/14 1047  . aspirin EC tablet 81 mg  81 mg Oral Daily Alphia Moh, MD   81 mg at 04/01/14 1046  . carvedilol (COREG) tablet 3.125 mg  3.125 mg Oral BID WC Satira Sark, MD   3.125 mg at 04/01/14 1657  . clonazePAM (KLONOPIN) tablet  0.5 mg  0.5 mg Oral BID PRN Alphia Moh, MD   0.5 mg at 03/28/14 2116  . docusate sodium (  COLACE) capsule 100 mg  100 mg Oral BID Deboraha Sprang, MD   100 mg at 04/01/14 1046  . feeding supplement (RESOURCE BREEZE) (RESOURCE BREEZE) liquid 1 Container  1 Container Oral TID BM Dalene Carrow, RD   1 Container at 03/31/14 2000  . levothyroxine (SYNTHROID, LEVOTHROID) tablet 200 mcg  200 mcg Oral QAC breakfast Alphia Moh, MD   200 mcg at 04/01/14 0626  . mexiletine (MEXITIL) capsule 200 mg  200 mg Oral Q12H Alphia Moh, MD   200 mg at 04/01/14 1047  . ondansetron (ZOFRAN) injection 4 mg  4 mg Intravenous Q6H PRN Leonie Man, MD   4 mg at 04/01/14 1654  . pantoprazole (PROTONIX) EC tablet 40 mg  40 mg Oral BID Alphia Moh, MD   40 mg at 04/01/14 1046  . polyethylene glycol (MIRALAX / GLYCOLAX) packet 17 g  17 g Oral Daily PRN Alphia Moh, MD   17 g at 03/30/14 1111  . raloxifene (EVISTA) tablet 60 mg  60 mg Oral Daily Alphia Moh, MD   60 mg at 04/01/14 1046  . sodium chloride 0.9 % injection 3 mL  3 mL Intravenous Q12H Alphia Moh, MD   3 mL at 04/01/14 1048    Allergies as of 04/12/2014 - Review Complete 04/01/2014  Allergen Reaction Noted  . Coconut oil Anaphylaxis and Hives 03/11/2013  . Lansoprazole Nausea Only 02/17/2011  . Penicillins Swelling 11/20/2009  . Statins Other (See Comments) 11/20/2009  . Lisinopril  07/18/2012  . Lactose intolerance (gi) Diarrhea and Other (See Comments) 03/03/2012    Family History  Problem Relation Age of Onset  . Diabetes Mother   . Diabetes Brother   . Arthritis      family history  . Prostate cancer      Family History    History   Social History  . Marital Status: Widowed    Spouse Name: N/A    Number of Children: N/A  . Years of Education: N/A   Occupational History  . Retired    Social History Main Topics  . Smoking status: Former Smoker -- 0.10 packs/day for 15 years    Types:  Cigarettes    Quit date: 04/21/2001  . Smokeless tobacco: Never Used     Comment: "smoked ~ 1 pack/month"  . Alcohol Use: 0.6 oz/week    1 Glasses of wine per week  . Drug Use: No  . Sexual Activity: Not Currently   Other Topics Concern  . Not on file   Social History Narrative   Lives alone, has a house, has some household help. Handicapped upfitted.    Review of Systems: See history of present illness  Physical Exam: Vital signs in last 24 hours: Temp:  [97.6 F (36.4 C)-98.8 F (37.1 C)] 97.6 F (36.4 C) (01/12 1300) Pulse Rate:  [69-74] 69 (01/12 1300) Resp:  [18-20] 18 (01/12 1300) BP: (88-111)/(31-59) 104/59 mmHg (01/12 1655) SpO2:  [100 %] 100 % (01/12 1300) Weight:  [56.201 kg (123 lb 14.4 oz)] 56.201 kg (123 lb 14.4 oz) (01/12 0500) Last BM Date: 03/31/14 General:   Alert,  Well-developed, well-nourished, pleasant and cooperative in NAD Head:  Normocephalic and atraumatic. Eyes:  Sclera clear, no icterus.   Conjunctiva pale. Mouth:   No ulcerations or lesions.  Oropharynx pink & moist. Neck:   No masses or thyromegaly. Lungs:  Clear throughout to auscultation.   No wheezes, crackles, or rhonchi. No evident respiratory distress. Heart:   Regular  rate and rhythm; no murmurs, clicks, rubs,  or gallops. Abdomen:  Soft, nontender, nontympanitic, and nondistended. No masses, hepatosplenomegaly or ventral hernias noted. Normal bowel sounds, without bruits, guarding, or rebound.   Msk:   Symmetrical without gross deformities. Pulses:  Slightly weak radial pulse noted. Extremities:   Without clubbing, cyanosis, or edema. Neurologic:  Alert and coherent;  grossly normal neurologically. Skin:  Intact without significant lesions or rashes. Cervical Nodes:  No significant cervical adenopathy. Psych:   Alert and cooperative. Normal mood and affect.  Intake/Output from previous day: 01/11 0701 - 01/12 0700 In: 360 [P.O.:360] Out: 901 [Urine:900; Stool:1] Intake/Output this  shift:    Lab Results:  Recent Labs  03/31/14 0319  WBC 4.2  HGB 10.5*  HCT 30.1*  PLT 139*   BMET  Recent Labs  03/30/14 0346 03/31/14 0319  NA 134* 132*  K 3.8 4.0  CL 102 105  CO2 24 21  GLUCOSE 113* 95  BUN 12 15  CREATININE 1.60* 1.59*  CALCIUM 8.8 8.2*   LFT  Recent Labs  03/31/14 0319  PROT 6.4  ALBUMIN 2.8*  AST 81*  ALT 33  ALKPHOS 74  BILITOT 1.7*   PT/INR No results for input(s): LABPROT, INR in the last 72 hours.  Studies/Results: Dg Abd 1 View  04/01/2014   CLINICAL DATA:  Nausea.  Vomiting.  EXAM: ABDOMEN - 1 VIEW  COMPARISON:  February 21, 2012.  FINDINGS: The bowel gas pattern is normal. No radio-opaque calculi or other significant radiographic abnormality are seen. Extensive postsurgical changes are seen in the lower lumbar spine.  IMPRESSION: No evidence of bowel obstruction or ileus.   Electronically Signed   By: Sabino Dick M.D.   On: 04/01/2014 15:03    Impression: Nausea, without clear explanation. I think the most likely cause is as a side effect to her amiodarone. Gastric dysmotility could also present in this fashion. Another possibility would be bile reflux gastritis, which can be a complication of gastric dysmotility.  Plan: 1. Gastric emptying scan in a.m. 2. Empiric trial of sucralfate suspension for possible bile reflux gastritis 3. Observation. My suspicion is that the patient's nausea symptoms will subside with the dose reduction in her amiodarone since admission, but it may take a while for that to occur. 4. I am reluctant to re-endoscope this patient since she was just scoped 6 months ago and, based on her cardiac factors, is not an ideal endoscopy candidate.   LOS: 5 days   Kariss Longmire V  04/01/2014, 7:25 PM

## 2014-04-01 NOTE — Progress Notes (Signed)
Utilization review completed.  

## 2014-04-02 ENCOUNTER — Inpatient Hospital Stay (HOSPITAL_COMMUNITY): Payer: Medicare Other

## 2014-04-02 ENCOUNTER — Encounter (HOSPITAL_COMMUNITY): Payer: Self-pay | Admitting: Physician Assistant

## 2014-04-02 DIAGNOSIS — R0789 Other chest pain: Secondary | ICD-10-CM

## 2014-04-02 NOTE — Progress Notes (Signed)
Patient Name: [name] Date of Encounter: 04/02/2014  Primary cardiologist: Dr. Minus Breeding EP: Dr. Virl Axe   Seen for followup: Recurrent VT   Active Problems:   Ischemic cardiomyopathy   AICD (automatic cardioverter/defibrillator) present   S/P AV nodal ablation, 02/05/14   Sustained ventricular tachycardia   Complete heart block   Persistent atrial fibrillation   Constipated   Protein-calorie malnutrition, severe   Nausea   Hypotension (arterial)    SUBJECTIVE  Nausea has not improved, worse with eating, states has not ate in days. Denies any CP or SOB  CURRENT MEDS . amiodarone  200 mg Oral Daily  . apixaban  2.5 mg Oral BID  . aspirin EC  81 mg Oral Daily  . carvedilol  3.125 mg Oral BID WC  . docusate sodium  100 mg Oral BID  . feeding supplement (RESOURCE BREEZE)  1 Container Oral TID BM  . levothyroxine  200 mcg Oral QAC breakfast  . mexiletine  200 mg Oral Q12H  . pantoprazole  40 mg Oral BID  . raloxifene  60 mg Oral Daily  . sodium chloride  3 mL Intravenous Q12H  . sucralfate  1 g Oral TID WC & HS    OBJECTIVE  Filed Vitals:   04/01/14 1300 04/01/14 1655 04/01/14 2032 04/02/14 0611  BP: 88/31 104/59 87/50 98/57   Pulse: 69  70 70  Temp: 97.6 F (36.4 C)   97.6 F (36.4 C)  TempSrc: Oral   Oral  Resp: 18  20 18   Height:      Weight:    125 lb 12.8 oz (57.063 kg)  SpO2: 100%  100% 100%    Intake/Output Summary (Last 24 hours) at 04/02/14 0835 Last data filed at 04/01/14 1700  Gross per 24 hour  Intake      0 ml  Output      0 ml  Net      0 ml   Filed Weights   03/31/14 0537 04/01/14 0500 04/02/14 0611  Weight: 123 lb 8 oz (56.019 kg) 123 lb 14.4 oz (56.201 kg) 125 lb 12.8 oz (57.063 kg)    PHYSICAL EXAM  General: Pleasant, NAD. Neuro: Alert and oriented X 3. Moves all extremities spontaneously. Psych: Normal affect. HEENT:  Normal  Neck: Supple without bruits or JVD. Lungs:  Resp regular and unlabored, CTA. Heart:  RRR no s3, s4, or murmurs. Abdomen: Soft, non-tender, non-distended, BS + x 4.  Extremities: No clubbing, cyanosis or edema. DP/PT/Radials 2+ and equal bilaterally.  Accessory Clinical Findings  CBC  Recent Labs  03/31/14 0319  WBC 4.2  HGB 10.5*  HCT 30.1*  MCV 94.1  PLT 427*   Basic Metabolic Panel  Recent Labs  03/31/14 0319  NA 132*  K 4.0  CL 105  CO2 21  GLUCOSE 95  BUN 15  CREATININE 1.59*  CALCIUM 8.2*   Liver Function Tests  Recent Labs  03/31/14 0319  AST 81*  ALT 33  ALKPHOS 74  BILITOT 1.7*  PROT 6.4  ALBUMIN 2.8*    TELE Paced rhythm, ?underlying AV dissociation    ECG  No new EKG  Echocardiogram 08/06/2013  LV EF: 15% -  20%  ------------------------------------------------------------------- History:  PMH: Post ICD placement. Rule out pericardial effusion. Rule out tamponade. Coronary artery disease. Congestive heart failure. PMH:  Myocardial infarction. Risk factors: Hypertension. Dyslipidemia.  ------------------------------------------------------------------- Study Conclusions  - Left ventricle: The cavity size was mildly dilated. Wall thickness was normal.  Systolic function was severely reduced. The estimated ejection fraction was in the range of 20% to 25%. There is akinesis of the mid-apicalanteroseptal and inferior myocardium. Doppler parameters are consistent with abnormal left ventricular relaxation (grade 1 diastolic dysfunction). - Aortic valve: There was trivial regurgitation. - Mitral valve: Calcified annulus. There was mild regurgitation. - Right ventricle: The cavity size was mildly dilated. Wall thickness was normal.     Radiology/Studies  Dg Chest 2 View  03/06/2014   CLINICAL DATA:  Defibrillator activation this morning at 930. Chest pain after defibrillator fired but resolution of chest pain.  EXAM: CHEST  2 VIEW  COMPARISON:  02/05/2014.  FINDINGS: Cardiomegaly. AICD is present  with coronary sinus lead. Multilevel vertebral augmentation. Small LEFT pleural effusion is present. Pulmonary vascular congestion is present which appears more prominent on the most recent comparison chest radiograph of 02/05/2014. Mediastinal contours appear similar. Aortic arch atherosclerosis.  Comparing to multiple priors, the appearance of the cardiopericardial silhouette appears similar.  IMPRESSION: 1. Cardiomegaly and pulmonary vascular congestion. 2. AICD leads appear unchanged. 3. Small LEFT pleural effusion suggesting volume overload.   Electronically Signed   By: Dereck Ligas M.D.   On: 03/06/2014 14:55   Dg Abd 1 View  04/01/2014   CLINICAL DATA:  Nausea.  Vomiting.  EXAM: ABDOMEN - 1 VIEW  COMPARISON:  February 21, 2012.  FINDINGS: The bowel gas pattern is normal. No radio-opaque calculi or other significant radiographic abnormality are seen. Extensive postsurgical changes are seen in the lower lumbar spine.  IMPRESSION: No evidence of bowel obstruction or ileus.   Electronically Signed   By: Sabino Dick M.D.   On: 04/01/2014 15:03   Dg Chest Portable 1 View  04/01/2014   CLINICAL DATA:  Chest pain and shortness of breath. Code ST-elevation MI.  EXAM: PORTABLE CHEST - 1 VIEW  COMPARISON:  03/06/2014.  FINDINGS: Right-sided multi lead pacemaker remains in place. Cardiomegaly is unchanged. There is increasing hazy opacity at the left lung base. Inferior-most left costophrenic angle not included in the field of view. Linear atelectasis in the right lower lung zone. Pulmonary vascular congestion, not significantly changed. Kyphoplasty noted in the upper thoracic spine.  IMPRESSION: Increased hazy opacity at the left lung base, likely increasing pleural effusion. Stable vascular congestion and cardiomegaly.   Electronically Signed   By: Jeb Levering M.D.   On: 03/28/2014 23:33    ASSESSMENT AND PLAN  1. Slow VT - initially arrived at code STEMI, which was  cancelled - ICD reprogram. K and Mg repleted. No further issue   - hypotensive with SBP 80-90s, on lowest dose of coreg, will check with Dr. Marlou Porch, per pt, her BP has always been on the low side.   2. H/o CAD s/p multiple PCI   3. ICM s/p primary prevention ICD 2006 4. Chronic systolic HF with EF 01-60% 5. Atrial fibrillation: continue eliquis 6. Refractory Nausea - unclear if related to amiodarone and mexiletine, interestingly his amio was increased 2 weeks ago and his mexiletine was added around same time, however her nausea only started last Thur after taking amio and mexiletine for 1.5 weeks - pending GI eval and gastric emptying scan this morning. Per GI, she was scoped 6 month ago - appreciate GI recommendation  - amiodarone decreased to 200mg  daily  - no obstruction noted on abdominal x ray  Disposition: discharge to SNF when medically stable  Signed, Almyra Deforest PA-C Pager: [phone]  Personally seen and examined. Agree with above.  Discussed 1/12 with Dr. Caryl Comes Decreased AMIO to 200 QD Hopefully, this will help with nausea. May need to decrease Mexelitine as well. Worried about recurrence of VT however.  GI note reviewed, appreciate Dr. Osborn Coho assistance.  -Gastric empty study.  Candee Furbish, MD

## 2014-04-02 NOTE — Progress Notes (Signed)
No better.  Still has food intolerance, characterized by nausea and vomiting, perhaps an hour after eating even small amounts.  Gastric emptying scan not yet performed.  Spoke w/ pt about Reglan--incl potential for side effects including insomnia, hypersomulence, tremor, depression, and potentially irreversible facial choreoathetotic movements.  Unclear if this medication would help her anyway.  We will decide tomorrow whether to give it a try, after we have the results of the gastric emptying scan.  Cleotis Nipper, M.D. [phone]

## 2014-04-02 NOTE — Progress Notes (Signed)
RN received call from [place] regarding pt Pacer not capturing. RN reviewed strip with charge nurse to verify. It was difficult for both RN's to determine if the pacer was not capturing at times due to the patient being AV paced. RN paged cardiology. M.D returned page and stated that they would take a look at it. No new orders were given. Will continue to monitor.

## 2014-04-02 NOTE — Progress Notes (Signed)
Patient Name: [name] Date of Encounter: 04/02/2014  Active Problems:   Ischemic cardiomyopathy   AICD (automatic cardioverter/defibrillator) present - St Jude   S/P AV nodal ablation, 02/05/14   Sustained ventricular tachycardia   Complete heart block   Persistent atrial fibrillation   Constipated   Protein-calorie malnutrition, severe   Nausea   Hypotension (arterial)   Primary Cardiologist: Dr Caryl Comes  Patient Profile: 64 y.o. female with a history of ICM (s/p ICD implantation in 2005; gen change 2006; dual chamber upgrade 2014 with subsequent extraction due to infection 11/2012, CRT-D implant 06/7652), chronic systolic heart failure, CAD last intervention 2004, last cath 06/2013 (patent the LAD and circumflex stents), myoview 04/2013 with no ischemia), prior CVA, chronic kidney disease, atrial arrhythmias and admission for sustained VT, was admitted 01/11 with slow VT. Amiodarone increased, mexilitine continued, but developed N&V, diarrhea, amio dose decreased and GI called.   SUBJECTIVE: Still with nausea, despite amio dose decrease, has diarrhea (explosive) tid as well. Abd tender but does not otherwise hurt. No SOB, thinks her dry weight is 128 lbs.  OBJECTIVE Filed Vitals:   04/01/14 1300 04/01/14 1655 04/01/14 2032 04/02/14 0611  BP: 88/31 104/59 87/50 98/57   Pulse: 69  70 70  Temp: 97.6 F (36.4 C)   97.6 F (36.4 C)  TempSrc: Oral   Oral  Resp: 18  20 18   Height:      Weight:    125 lb 12.8 oz (57.063 kg)  SpO2: 100%  100% 100%    Intake/Output Summary (Last 24 hours) at 04/02/14 1052 Last data filed at 04/01/14 1700  Gross per 24 hour  Intake      0 ml  Output      0 ml  Net      0 ml   Filed Weights   03/31/14 0537 04/01/14 0500 04/02/14 0611  Weight: 123 lb 8 oz (56.019 kg) 123 lb 14.4 oz (56.201 kg) 125 lb 12.8 oz (57.063 kg)    PHYSICAL EXAM General: Well developed, well nourished, female in no acute distress. Head: Normocephalic,  atraumatic.  Neck: Supple without bruits, JVD not elevated. Lungs:  Resp regular and unlabored, CTA. Heart: RRR, S1, S2, no S3, S4, 2/6 murmur; no rub. Abdomen: Soft, diffusely tender, non-distended, BS present.  Extremities: No clubbing, cyanosis, no edema.  Neuro: Alert and oriented X 3. Moves all extremities spontaneously. Psych: Normal affect.  LABS: CBC:  Recent Labs  03/31/14 0319  WBC 4.2  HGB 10.5*  HCT 30.1*  MCV 94.1  PLT 650*   Basic Metabolic Panel:  Recent Labs  03/31/14 0319  NA 132*  K 4.0  CL 105  CO2 21  GLUCOSE 95  BUN 15  CREATININE 1.59*  CALCIUM 8.2*   Liver Function Tests:  Recent Labs  03/31/14 0319  AST 81*  ALT 33  ALKPHOS 74  BILITOT 1.7*  PROT 6.4  ALBUMIN 2.8*   TELE:  A pacing at times, BiV pacing at times, ?fusion beats.    Radiology/Studies: Dg Abd 1 View 04/01/2014   CLINICAL DATA:  Nausea.  Vomiting.  EXAM: ABDOMEN - 1 VIEW  COMPARISON:  February 21, 2012.  FINDINGS: The bowel gas pattern is normal. No radio-opaque calculi or other significant radiographic abnormality are seen. Extensive postsurgical changes are seen in the lower lumbar spine.  IMPRESSION: No evidence of bowel obstruction or ileus.   Electronically Signed   By: Sabino Dick M.D.   On: 04/01/2014  15:03     Current Medications:  . amiodarone  200 mg Oral Daily  . apixaban  2.5 mg Oral BID  . aspirin EC  81 mg Oral Daily  . carvedilol  3.125 mg Oral BID WC  . docusate sodium  100 mg Oral BID  . feeding supplement (RESOURCE BREEZE)  1 Container Oral TID BM  . levothyroxine  200 mcg Oral QAC breakfast  . mexiletine  200 mg Oral Q12H  . pantoprazole  40 mg Oral BID  . raloxifene  60 mg Oral Daily  . sodium chloride  3 mL Intravenous Q12H  . sucralfate  1 g Oral TID WC & HS      ASSESSMENT AND PLAN: Active Problems:   Ischemic cardiomyopathy - volume status is good    AICD (automatic cardioverter/defibrillator) present - St Jude    S/P AV nodal  ablation, 02/05/14 - pacing    Sustained ventricular tachycardia - None seen overnight, s/p ICD reprogramming and electrolyte replacement, Mg 1.8 on 01/10, K+ 4.0 01/11, recheck in am    Complete heart block - see above    Persistent atrial fibrillation - anticoag w/ Eliquis    Constipated - now with diarrhea    Protein-calorie malnutrition, severe - says is steadily losing weight    Nausea - GI seeing, gastric emptying study today    Hypotension (arterial) - she tolerates SBP 90s very well. Would continue Coreg at current dose.     Acute Renal insufficiency - BUN/Cr were 15/1.06 3 weeks ago. 14/1.57 on admission, not checked in 2 days, will recheck in am.    SignedRosaria Ferries , PA-C 10:52 AM 04/02/2014

## 2014-04-03 ENCOUNTER — Encounter (HOSPITAL_COMMUNITY): Payer: Self-pay | Admitting: Internal Medicine

## 2014-04-03 LAB — BASIC METABOLIC PANEL
Anion gap: 16 — ABNORMAL HIGH (ref 5–15)
BUN: 18 mg/dL (ref 6–23)
CHLORIDE: 100 meq/L (ref 96–112)
CO2: 22 mmol/L (ref 19–32)
CREATININE: 2.11 mg/dL — AB (ref 0.50–1.10)
Calcium: 9.1 mg/dL (ref 8.4–10.5)
GFR calc Af Amer: 27 mL/min — ABNORMAL LOW (ref >90)
GFR, EST NON AFRICAN AMERICAN: 24 mL/min — AB (ref >90)
GLUCOSE: 118 mg/dL — AB (ref 70–99)
Potassium: 4 mmol/L (ref 3.5–5.1)
SODIUM: 138 mmol/L (ref 135–145)

## 2014-04-03 LAB — MAGNESIUM: Magnesium: 1.8 mg/dL (ref 1.5–2.5)

## 2014-04-03 MED ORDER — METOCLOPRAMIDE HCL 5 MG/5ML PO SOLN
5.0000 mg | Freq: Three times a day (TID) | ORAL | Status: DC
  Administered 2014-04-03 – 2014-04-08 (×20): 5 mg via ORAL
  Filled 2014-04-03 (×27): qty 5

## 2014-04-03 NOTE — Clinical Social Work Note (Signed)
CSW provided patient with bed offers- patient to research.  CSW will continue to follow.  Domenica Reamer, [place] Social Worker [phone]

## 2014-04-03 NOTE — Progress Notes (Signed)
Physical Therapy Treatment Patient Details Name: [name] MRN: [id number] DOB: [birth year] Today's Date: 04/10/14    History of Present Illness [name] is a 64 y.o. female with a history of ICM, chronic systolic heart failure, CAD last intervention 2004,  prior CVA, chronic kidney disease, atrial arrhythmias and recent admission for sustained ventricular tachycardia who presented to [facility] 1/7 after an ICD shock    PT Comments    Pt progressing with gait with increased distance and activity tolerance today. Pt encouraged to walk in room throughout day and continue HEP. Will continue to follow.  Follow Up Recommendations  SNF;Supervision for mobility/OOB     Equipment Recommendations       Recommendations for Other Services       Precautions / Restrictions Precautions Precautions: Fall    Mobility  Bed Mobility Overal bed mobility: Modified Independent                Transfers Overall transfer level: Modified independent                  Ambulation/Gait   Ambulation Distance (Feet): 110 Feet Assistive device: Rolling walker (2 wheeled) Gait Pattern/deviations: Step-through pattern;Decreased stride length;Trunk flexed     General Gait Details: cues for posture, position in RW and standing rest halfway grossly 30sec   Stairs            Wheelchair Mobility    Modified Rankin (Stroke Patients Only)       Balance Overall balance assessment: Needs assistance   Sitting balance-Leahy Scale: Good       Standing balance-Leahy Scale: Fair                      Cognition Arousal/Alertness: Awake/alert Behavior During Therapy: WFL for tasks assessed/performed Overall Cognitive Status: Within Functional Limits for tasks assessed                      Exercises General Exercises - Lower Extremity Long Arc Quad: AROM;Seated;Both;15 reps Hip ABduction/ADduction: AROM;Seated;Both;15 reps Hip Flexion/Marching:  AROM;Seated;Both;15 reps Toe Raises: AROM;Seated;Both;15 reps Heel Raises: AROM;Seated;Both;15 reps    General Comments        Pertinent Vitals/Pain Pain Assessment: No/denies pain    Home Living                      Prior Function            PT Goals (current goals can now be found in the care plan section) Progress towards PT goals: Progressing toward goals    Frequency       PT Plan Current plan remains appropriate    Co-evaluation             End of Session   Activity Tolerance: Patient tolerated treatment well Patient left: in chair;with call bell/phone within reach;with chair alarm set     Time: 1035-1050 PT Time Calculation (min) (ACUTE ONLY): 15 min  Charges:  $Gait Training: 8-22 mins                    G Codes:      [name] April 10, 2014, 11:23 AM [name], [place]

## 2014-04-03 NOTE — Progress Notes (Signed)
Gastric emptying scan yesterday was markedly abnormal, with 71% retention of the test meal at 2 hours (normal is 30% or less). Interestingly, the patient did not vomit up the test meal, but did vomit up clear liquids consumed later that evening.  After discussion, and with awareness of potential side effects, the patient would like to be tried on metoclopramide. I will start with low doses, and use the elixir form for optimal emptying from the stomach.  I will also place the patient on a clear liquid gastroparesis diet, using frequent small aliquots of liquids in a gradually increased fashion, as tolerated.  This was discussed in detail with the patient and her nurse.  Cleotis Nipper, M.D. [phone]

## 2014-04-03 NOTE — Progress Notes (Signed)
Subjective:  Still with nausea this AM. Complete gastric empty study  Objective:  Vital Signs in the last 24 hours: Temp:  [97.4 F (36.3 C)-98 F (36.7 C)] 98 F (36.7 C) (01/14 0700) Pulse Rate:  [70-71] 71 (01/14 0700) Resp:  [18] 18 (01/14 0700) BP: (92-102)/(53-61) 92/61 mmHg (01/14 0700) SpO2:  [100 %] 100 % (01/14 0700)  Intake/Output from previous day:     Physical Exam: General: elderly  in no acute distress. Head:  Normocephalic and atraumatic. Lungs: Clear to auscultation and percussion. Heart: Normal S1 and S2.  No murmur, rubs or gallops.  Abdomen: soft, non-tender, positive bowel sounds. Extremities: No clubbing or cyanosis. No edema. Neurologic: Alert and oriented x 3.    Lab Results:   Recent Labs  04/03/14 0510  NA 138  K 4.0  CL 100  CO2 22  GLUCOSE 118*  BUN 18  CREATININE 2.11*  Imaging: Dg Abd 1 View  04/01/2014   CLINICAL DATA:  Nausea.  Vomiting.  EXAM: ABDOMEN - 1 VIEW  COMPARISON:  February 21, 2012.  FINDINGS: The bowel gas pattern is normal. No radio-opaque calculi or other significant radiographic abnormality are seen. Extensive postsurgical changes are seen in the lower lumbar spine.  IMPRESSION: No evidence of bowel obstruction or ileus.   Electronically Signed   By: Sabino Dick M.D.   On: 04/01/2014 15:03   Nm Gastric Emptying  04/02/2014   CLINICAL DATA:  Abdomen pain, nausea and vomiting that started 1 week ago.  EXAM: NUCLEAR MEDICINE GASTRIC EMPTYING SCAN  TECHNIQUE: After oral ingestion of radiolabeled meal, sequential abdominal images were obtained for 120 minutes. Residual percentage of activity remaining within the stomach was calculated at 60 and 120 minutes.  RADIOPHARMACEUTICALS:  2 mCi Technetium 99-m labeled sulfur colloid  COMPARISON:  None.  FINDINGS: Expected location of the stomach in the left upper quadrant. Ingested meal empties the stomach gradually over the course of the study with 84% retention at 60 min and 71%  retention at 120 min (normal retention less than 30% at a 120 min).  IMPRESSION: Abnormal gastric emptying study with delayed gastric emptying as described.   Electronically Signed   By: Abelardo Diesel M.D.   On: 04/02/2014 15:02    Telemetry: Paced, NO VT Personally viewed.   Cardiac Studies:  EF 25%  Scheduled Meds: . amiodarone  200 mg Oral Daily  . apixaban  2.5 mg Oral BID  . aspirin EC  81 mg Oral Daily  . carvedilol  3.125 mg Oral BID WC  . docusate sodium  100 mg Oral BID  . feeding supplement (RESOURCE BREEZE)  1 Container Oral TID BM  . levothyroxine  200 mcg Oral QAC breakfast  . mexiletine  200 mg Oral Q12H  . pantoprazole  40 mg Oral BID  . raloxifene  60 mg Oral Daily  . sodium chloride  3 mL Intravenous Q12H  . sucralfate  1 g Oral TID WC & HS   Continuous Infusions:  PRN Meds:.[CANCELED] Place/Maintain arterial line **AND** sodium chloride, acetaminophen, clonazePAM, ondansetron (ZOFRAN) IV, polyethylene glycol  Assessment/Plan:  Active Problems:   Ischemic cardiomyopathy   AICD (automatic cardioverter/defibrillator) present - St Jude   S/P AV nodal ablation, 02/05/14   Sustained ventricular tachycardia   Complete heart block   Persistent atrial fibrillation   Constipated   Protein-calorie malnutrition, severe   Nausea   Hypotension (arterial)  Nausea  - issue that is keeping her in hospital  -  Appreciate Dr. Osborn Coho assistance.  - Gastric study abnormal. Appreciate recs. Reviewed notes.  - Abd x-ray normal  VT  - no further issue at this time, certainly may return   - amio 200 QD (reduced this admit secondary to nausea)  - Mexelitine at low dose of 200 BID.  40% of patients may have nausea as side effect. Appreciate guidance as well from Dr. Caryl Comes.   ICM ICD  - continue low dose coreg (hypotension)   Brettney Ficken 04/03/2014, 10:01 AM

## 2014-04-04 LAB — BASIC METABOLIC PANEL
Anion gap: 7 (ref 5–15)
BUN: 20 mg/dL (ref 6–23)
CHLORIDE: 104 meq/L (ref 96–112)
CO2: 22 mmol/L (ref 19–32)
CREATININE: 2.11 mg/dL — AB (ref 0.50–1.10)
Calcium: 8.8 mg/dL (ref 8.4–10.5)
GFR calc Af Amer: 27 mL/min — ABNORMAL LOW (ref >90)
GFR calc non Af Amer: 24 mL/min — ABNORMAL LOW (ref >90)
Glucose, Bld: 92 mg/dL (ref 70–99)
POTASSIUM: 4.3 mmol/L (ref 3.5–5.1)
Sodium: 133 mmol/L — ABNORMAL LOW (ref 135–145)

## 2014-04-04 NOTE — Clinical Social Work Note (Signed)
CSW visited with patient to determine bed choice- patient has not decided at this time and is hopeful that she can return home.  CSW encouraged patient to do research on facility so that she will have a choice if she does need rehab at time of DC.  CSW will continue to follow.  Domenica Reamer, [place] Social Worker [phone]

## 2014-04-04 NOTE — Progress Notes (Signed)
GASTROENTEROLOGY PROGRESS NOTE  Problem:   Nausea, documented gastroparesis (71% retention at 2 hours).  Subjective: Feeling much better today. No vomiting for the past 24 hours. Currently on frequent small aliquots of clear liquids (30 ML's every 30 minutes), well tolerated. Also started yesterday on sucralfate suspension and metoclopramide 5 mg orally 4 times a day. Patient has also had a decrease in her amiodarone and her mexiletine dose.  Objective: Looks well. No emesis and past 24 hours, per patient  Assessment: The patient is clearly improved, but it is not clear whether this improvement is due to the Reglan, the sucralfate, or the decrease in her cardiac meds.  Plan: 1. Increased size of aliquots of liquids to 60 ML's every 30 minutes while awake 2. If that is tolerated, I would advance to a low fiber diet tomorrow, emphasizing frequent small snacks (nothing more than she could hold in the palm of her hand, at any one time), with avoidance of vegetables. 3. Long-term, I would encourage attempts to wean the patient off her sucralfate and her metoclopramide, as tolerated.  Dr. Wilford Corner will be rounding for [place] tomorrow.  Cleotis Nipper, M.D. 04/04/2014 11:18 AM

## 2014-04-04 NOTE — Progress Notes (Signed)
NUTRITION FOLLOW-UP  DOCUMENTATION CODES Per approved criteria  -Severe malnutrition in the context of acute illness or injury   INTERVENTION:  Resource Breeze PO TID, each supplement provides 250 kcal and 9 grams of protein  NUTRITION DIAGNOSIS: Malnutrition related to inadequate oral intake as evidenced by 13% weight loss in 3 months and moderate depletion of muscle mass; ongoing.    Goal: Intake to meet >90% of estimated nutrition needs; not met.  Monitor:  PO intake, labs, weight trend, supplement acceptance.  ASSESSMENT: Admitted on 1/7 with slow VT below detection rate associated with LH and nausea.  Per MD nausea is main issue currently. S/p gastric emptying scan 1/13 which was abnormal with 71% retention at 2 hours. Pt started on Reglan and started on a clear liquid diet. GI MD progressing this gradually as pt tolerates.   Meal completion has been <25% of her meals.   Pt started taking liquids 30 ml every 30 min 1/14 per MD orders. Pt is now up to 60 ml every 30 min. Pt is very happy this am as she has not had any nausea and vomiting today and seems to be tolerating her liquids. Pt continues to use [employer] as part of her daily liquids.   Height: Ht Readings from Last 1 Encounters:  04/04/14 5' 4"  (1.626 m)    Weight: Wt Readings from Last 1 Encounters:  04/04/14 124 lb 12.8 oz (56.609 kg)  Admission weight: 120 lb (54.4 kg) 1/7  BMI:  Body mass index is 21.41 kg/(m^2).  Estimated Nutritional Needs: Kcal: 1500-1700 Protein: 75-90 gm Fluid: 1.5-1.7 L  Skin: WDL  Diet Order: Diet bariatric clear liquid - diet order per MD   Intake/Output Summary (Last 24 hours) at 04/04/14 0948 Last data filed at 04/04/14 0900  Gross per 24 hour  Intake    170 ml  Output      0 ml  Net    170 ml    Last BM: 1/13   Labs:   Recent Labs Lab 03/29/14 1338 03/30/14 0346 03/31/14 0319 04/03/14 0510 04/04/14 0611  NA 133* 134* 132* 138 133*  K 3.6 3.8 4.0  4.0 4.3  CL 102 102 105 100 104  CO2 22 24 21 22 22   BUN 12 12 15 18 20   CREATININE 1.72* 1.60* 1.59* 2.11* 2.11*  CALCIUM 8.7 8.8 8.2* 9.1 8.8  MG 1.9 1.8  --  1.8  --   GLUCOSE 114* 113* 95 118* 92    CBG (last 3)  No results for input(s): GLUCAP in the last 72 hours.  Scheduled Meds: . amiodarone  200 mg Oral Daily  . apixaban  2.5 mg Oral BID  . carvedilol  3.125 mg Oral BID WC  . docusate sodium  100 mg Oral BID  . feeding supplement (RESOURCE BREEZE)  1 Container Oral TID BM  . levothyroxine  200 mcg Oral QAC breakfast  . metoCLOPramide  5 mg Oral TID AC & HS  . mexiletine  200 mg Oral Q12H  . pantoprazole  40 mg Oral BID  . raloxifene  60 mg Oral Daily  . sodium chloride  3 mL Intravenous Q12H  . sucralfate  1 g Oral TID WC & HS    Continuous Infusions:   Maylon Peppers RD, LDN, CNSC [phone] Pager [phone] After Hours Pager

## 2014-04-04 NOTE — Progress Notes (Addendum)
Subjective:  Feels better this AM. Reglan elixer started 1/14.   Objective:  Vital Signs in the last 24 hours: Temp:  [97.5 F (36.4 C)-98 F (36.7 C)] 97.5 F (36.4 C) (01/15 0435) Pulse Rate:  [65-69] 69 (01/15 0435) Resp:  [18-20] 18 (01/15 0435) BP: (92-103)/(45-55) 93/49 mmHg (01/15 0435) SpO2:  [99 %-100 %] 100 % (01/15 0435) Weight:  [124 lb 12.8 oz (56.609 kg)] 124 lb 12.8 oz (56.609 kg) (01/15 0435)  Intake/Output from previous day: 01/14 0701 - 01/15 0700 In: 14 [P.O.:50] Out: -    Physical Exam: General: elderly  in no acute distress. Head:  Normocephalic and atraumatic. Lungs: Clear to auscultation and percussion. Heart: Normal S1 and S2.  No murmur, rubs or gallops.  Abdomen: soft, non-tender, positive bowel sounds. Extremities: No clubbing or cyanosis. No edema. Neurologic: Alert and oriented x 3.    Lab Results:   Recent Labs  04/03/14 0510 04/04/14 0611  NA 138 133*  K 4.0 4.3  CL 100 104  CO2 22 22  GLUCOSE 118* 92  BUN 18 20  CREATININE 2.11* 2.11*  Imaging: Nm Gastric Emptying  04/02/2014   CLINICAL DATA:  Abdomen pain, nausea and vomiting that started 1 week ago.  EXAM: NUCLEAR MEDICINE GASTRIC EMPTYING SCAN  TECHNIQUE: After oral ingestion of radiolabeled meal, sequential abdominal images were obtained for 120 minutes. Residual percentage of activity remaining within the stomach was calculated at 60 and 120 minutes.  RADIOPHARMACEUTICALS:  2 mCi Technetium 99-m labeled sulfur colloid  COMPARISON:  None.  FINDINGS: Expected location of the stomach in the left upper quadrant. Ingested meal empties the stomach gradually over the course of the study with 84% retention at 60 min and 71% retention at 120 min (normal retention less than 30% at a 120 min).  IMPRESSION: Abnormal gastric emptying study with delayed gastric emptying as described.   Electronically Signed   By: Abelardo Diesel M.D.   On: 04/02/2014 15:02    Telemetry: Paced, NO VT  Personally viewed.   Cardiac Studies:  EF 25%  Scheduled Meds: . amiodarone  200 mg Oral Daily  . apixaban  2.5 mg Oral BID  . aspirin EC  81 mg Oral Daily  . carvedilol  3.125 mg Oral BID WC  . docusate sodium  100 mg Oral BID  . feeding supplement (RESOURCE BREEZE)  1 Container Oral TID BM  . levothyroxine  200 mcg Oral QAC breakfast  . metoCLOPramide  5 mg Oral TID AC & HS  . mexiletine  200 mg Oral Q12H  . pantoprazole  40 mg Oral BID  . raloxifene  60 mg Oral Daily  . sodium chloride  3 mL Intravenous Q12H  . 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 Osborn Coho assistance.   - Gastric study abnormal. Appreciate recs. Reviewed notes.  - Abd x-ray normal  - He started low dose reglan for motility. She feels better this AM.  VT  - no further issue at this time, certainly may return   - amio 200 QD (reduced this admit secondary to nausea)  - Mexelitine at low dose of 200 BID.    ICM ICD  - continue low dose coreg (hypotension)  CKD  - creat stable. Na slightly lower this am. Watch  AFIB  - Eliquis 2.5 BID  - Stopping hospital dosed ASA 81. Was not on as outpatient.   Hopeful DC soon ?Sat or Sun.   [name], [place] 04/04/2014, 8:50 AM

## 2014-04-05 LAB — BASIC METABOLIC PANEL
ANION GAP: 15 (ref 5–15)
BUN: 26 mg/dL — ABNORMAL HIGH (ref 6–23)
CO2: 19 mmol/L (ref 19–32)
CREATININE: 2.57 mg/dL — AB (ref 0.50–1.10)
Calcium: 9.1 mg/dL (ref 8.4–10.5)
Chloride: 98 mEq/L (ref 96–112)
GFR calc Af Amer: 22 mL/min — ABNORMAL LOW (ref >90)
GFR, EST NON AFRICAN AMERICAN: 19 mL/min — AB (ref >90)
GLUCOSE: 104 mg/dL — AB (ref 70–99)
POTASSIUM: 4.5 mmol/L (ref 3.5–5.1)
SODIUM: 132 mmol/L — AB (ref 135–145)

## 2014-04-05 NOTE — Progress Notes (Signed)
Patient ID: [name], female   DOB: [birth year], 64 y.o.   MRN: [id number] [facility] Gastroenterology Progress Note  [name] 64 y.o. 11-01-1950   Subjective: Feels a little better. Less nausea. Tolerating clears and wants some food.   Objective: Vital signs in last 24 hours: Filed Vitals:   04/05/14 0422  BP: 129/64  Pulse: 69  Temp: 97.8 F (36.6 C)  Resp: 18    Physical Exam: Gen: alert, no acute distress Chest: CTA anteriorly CV: RRR Abd: diffusely tender with minimal guarding, soft, nondistended, +BS  Lab Results:  Recent Labs  04/03/14 0510 04/04/14 0611 04/05/14 0440  NA 138 133* 132*  K 4.0 4.3 4.5  CL 100 104 98  CO2 22 22 19   GLUCOSE 118* 92 104*  BUN 18 20 26*  CREATININE 2.11* 2.11* 2.57*  CALCIUM 9.1 8.8 9.1  MG 1.8  --   --    No results for input(s): AST, ALT, ALKPHOS, BILITOT, PROT, ALBUMIN in the last 72 hours. No results for input(s): WBC, NEUTROABS, HGB, HCT, MCV, PLT in the last 72 hours. No results for input(s): LABPROT, INR in the last 72 hours.    Assessment/Plan: 64 yo with gastroparesis likely idiopathic. Continue Metoclopramide and Sucralfate. Diet changed to full liquids and will slowly advance to low fiber diet as tolerated. Continue supportive care. [name] is a patient of mine that is well-known to me and seems to be in good spirits. Anticipate discharge on Monday if she tolerates advancement of her diet.   Fisher C. 04/05/2014, 9:20 AM

## 2014-04-05 NOTE — Progress Notes (Signed)
When discontinuing IV catheter moderate mucous discharge noted from site. Cleansed with alcohol and dressed with bandage. Patient denies tenderness to site.

## 2014-04-05 NOTE — Progress Notes (Signed)
Came into patient room and she stated that she fell on the floor after she used the bathroom. The NT was in the room and stated that she was lowered to the floor. VSS. BP 113/48, HR 69, O2 100% RA. Notified Dr. Marlou Porch and attempted to call the family but patient stated that she has none. Patient placed on a chair alarm and knows not to get up without assistance. Patient has no complaints of pain at this time.

## 2014-04-05 NOTE — Progress Notes (Signed)
    Subjective:  Feels better this AM again. Reglan elixer started 1/14. Dr. Michail Sermon saw (known well to him.)  Objective:  Vital Signs in the last 24 hours: Temp:  [97.8 F (36.6 C)-98.2 F (36.8 C)] 97.8 F (36.6 C) (01/16 0422) Pulse Rate:  [68-69] 69 (01/16 0422) Resp:  [18] 18 (01/16 0422) BP: (107-129)/(43-64) 129/64 mmHg (01/16 0422) SpO2:  [97 %-100 %] 100 % (01/16 0422) Weight:  [129 lb 6.4 oz (58.695 kg)] 129 lb 6.4 oz (58.695 kg) (01/16 0500)  Intake/Output from previous day: 01/15 0701 - 01/16 0700 In: 360 [P.O.:360] Out: 3 [Urine:2; Stool:1]   Physical Exam: General: elderly  in no acute distress. Head:  Normocephalic and atraumatic. Lungs: Clear to auscultation and percussion. Heart: Normal S1 and S2.  No murmur, rubs or gallops.  Abdomen: soft, non-tender, positive bowel sounds. Extremities: No clubbing or cyanosis. No edema. Neurologic: Alert and oriented x 3.    Lab Results:   Recent Labs  04/04/14 0611 04/05/14 0440  NA 133* 132*  K 4.3 4.5  CL 104 98  CO2 22 19  GLUCOSE 92 104*  BUN 20 26*  CREATININE 2.11* 2.57*  Imaging: No results found.  Telemetry: Paced, NO VT Personally viewed.   Cardiac Studies:  EF 25%  Scheduled Meds: . amiodarone  200 mg Oral Daily  . apixaban  2.5 mg Oral BID  . carvedilol  3.125 mg Oral BID WC  . docusate sodium  100 mg Oral BID  . feeding supplement (RESOURCE BREEZE)  1 Container Oral TID BM  . levothyroxine  200 mcg Oral QAC breakfast  . metoCLOPramide  5 mg Oral TID AC & HS  . mexiletine  200 mg Oral Q12H  . pantoprazole  40 mg Oral BID  . raloxifene  60 mg Oral Daily  . sodium chloride  3 mL Intravenous Q12H  . 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 Buccini's/ Schooler's assistance.   - Gastric study abnormal. Appreciate recs. Reviewed notes.  - He started low dose reglan for motility. She feels better  - increasing diet   VT  - no further issue at this time, certainly may return   - amio 200 QD (reduced this admit secondary to nausea)  - Mexelitine at low dose of 200 BID.    ICM ICD  - continue low dose coreg (hypotension)  CKD  - creat stable. Na slightly lower this am. Watch  AFIB  - Eliquis 2.5 BID  - Stopping hospital dosed ASA 81. Was not on as outpatient.   Hopeful DC soon Monday   Momo Braun, [place] 04/05/2014, 9:34 AM

## 2014-04-06 DIAGNOSIS — I442 Atrioventricular block, complete: Secondary | ICD-10-CM

## 2014-04-06 NOTE — Progress Notes (Signed)
    Subjective:  Feels better this AM again. Reglan elixer started 1/14. Dr. Michail Sermon saw (known well to him.) Had leg weakness yesterday when going to bathroom. No syncope. She has been in bed for quite a while. Discussed. BP was OK post.   Objective:  Vital Signs in the last 24 hours: Temp:  [97.3 F (36.3 C)-97.6 F (36.4 C)] 97.6 F (36.4 C) (01/17 0621) Pulse Rate:  [69-73] 71 (01/17 0621) Resp:  [18-20] 18 (01/17 0621) BP: (91-113)/(48-67) 98/67 mmHg (01/17 0621) SpO2:  [92 %-100 %] 99 % (01/17 0621) Weight:  [127 lb 10.3 oz (57.9 kg)] 127 lb 10.3 oz (57.9 kg) (01/17 0621)  Intake/Output from previous day: 01/16 0701 - 01/17 0700 In: 360 [P.O.:360] Out: -    Physical Exam: General: elderly  in no acute distress. Head:  Normocephalic and atraumatic. Lungs: Clear to auscultation and percussion. Heart: Normal S1 and S2.  No murmur, rubs or gallops.  Abdomen: soft, non-tender, positive bowel sounds. Extremities: No clubbing or cyanosis. No edema. Neurologic: Alert and oriented x 3.    Lab Results:   Recent Labs  04/04/14 0611 04/05/14 0440  NA 133* 132*  K 4.3 4.5  CL 104 98  CO2 22 19  GLUCOSE 92 104*  BUN 20 26*  CREATININE 2.11* 2.57*  Imaging: No results found.  Telemetry: Paced, NO VT Personally viewed.   Cardiac Studies:  EF 25%  Scheduled Meds: . amiodarone  200 mg Oral Daily  . apixaban  2.5 mg Oral BID  . carvedilol  3.125 mg Oral BID WC  . docusate sodium  100 mg Oral BID  . feeding supplement (RESOURCE BREEZE)  1 Container Oral TID BM  . levothyroxine  200 mcg Oral QAC breakfast  . metoCLOPramide  5 mg Oral TID AC & HS  . mexiletine  200 mg Oral Q12H  . pantoprazole  40 mg Oral BID  . raloxifene  60 mg Oral Daily  . sodium chloride  3 mL Intravenous Q12H  . 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  - issue that is keeping her in hospital  - Appreciate Dr. Buccini's/ Schooler's assistance.   - Gastric study abnormal. Appreciate recs. Reviewed notes.  - He started low dose reglan for motility. She feels better  - increasing diet   VT  - no further issue at this time, certainly may return   - amio 200 QD (reduced this admit secondary to nausea)  - Mexelitine at low dose of 200 BID.    ICM ICD  - continue low dose coreg (hypotension)  CKD  - creat stable. Na slightly lower this am. Watch  AFIB  - Eliquis 2.5 BID  - Stopping hospital dosed ASA 81. Was not on as outpatient.   Hopeful DC Monday to SNF  Mathew Storck, [place] 04/06/2014, 9:41 AM

## 2014-04-06 NOTE — Progress Notes (Signed)
Patient ID: [name], female   DOB: 1950/08/14, 64 y.o.   MRN: [id number] [facility] Gastroenterology Progress Note  [name] 64 y.o. [birth year]   Subjective: Sleeping comfortably but arousable. Feels good. Reports a small amount of nausea last night without vomiting. Tolerating POs. Reports chronic abdominal pain when it is palpated but denies any abdominal pain otherwise.  Objective: Vital signs in last 24 hours: Filed Vitals:   04/06/14 0621  BP: 98/67  Pulse: 71  Temp: 97.6 F (36.4 C)  Resp: 18    Physical Exam: Gen: elderly, no acute distress Abd: diffusely tender with voluntary guarding, soft, nondistended, +BS  Lab Results:  Recent Labs  04/04/14 0611 04/05/14 0440  NA 133* 132*  K 4.3 4.5  CL 104 98  CO2 22 19  GLUCOSE 92 104*  BUN 20 26*  CREATININE 2.11* 2.57*  CALCIUM 8.8 9.1   No results for input(s): AST, ALT, ALKPHOS, BILITOT, PROT, ALBUMIN in the last 72 hours. No results for input(s): WBC, NEUTROABS, HGB, HCT, MCV, PLT in the last 72 hours. No results for input(s): LABPROT, INR in the last 72 hours.    Assessment/Plan: Gastroparesis - improving with Reglan and Carafate (continue as outpt until f/u appt). Advance diet to low fiber diet. Home tomorrow from GI standpoint with f/u with me in 3-4 weeks. Will sign off. Call if questions. Dr. Penelope Coop available to see tomorrow if necessary.   Tyler Run C. 04/06/2014, 11:32 AM

## 2014-04-07 ENCOUNTER — Inpatient Hospital Stay (HOSPITAL_COMMUNITY): Payer: Medicare Other

## 2014-04-07 DIAGNOSIS — I472 Ventricular tachycardia, unspecified: Secondary | ICD-10-CM | POA: Insufficient documentation

## 2014-04-07 DIAGNOSIS — N179 Acute kidney failure, unspecified: Secondary | ICD-10-CM

## 2014-04-07 DIAGNOSIS — N183 Chronic kidney disease, stage 3 (moderate): Secondary | ICD-10-CM

## 2014-04-07 MED ORDER — FUROSEMIDE 10 MG/ML IJ SOLN
40.0000 mg | Freq: Once | INTRAMUSCULAR | Status: AC
  Administered 2014-04-07: 40 mg via INTRAVENOUS
  Filled 2014-04-07: qty 4

## 2014-04-07 NOTE — Progress Notes (Signed)
Physical Therapy Treatment Patient Details Name: [name] MRN: [id number] DOB: Nov 05, 1950 Today's Date: 04/07/2014    History of Present Illness [name] is a 64 y.o. female with a history of ICM, chronic systolic heart failure, CAD last intervention 2004,  prior CVA, chronic kidney disease, atrial arrhythmias and recent admission for sustained ventricular tachycardia who presented to [facility] 1/7 after an ICD shock    PT Comments    Pt more remarkably fatigued today with decreased activity tolerance and balance. Pt stating she desires to return home but truly was unable to ambulate in room secondary to fatigue, fall since admission and decreased awareness of deficits do not make her a good candidate to be home alone. Continue to recommend SNF and discussed this very directly with [name]. Will continue to follow and recommend OOB throughout day with assist as well as continued HEP.  Follow Up Recommendations  SNF;Supervision for mobility/OOB     Equipment Recommendations       Recommendations for Other Services       Precautions / Restrictions Precautions Precautions: Fall    Mobility  Bed Mobility Overal bed mobility: Modified Independent             General bed mobility comments: increased time to complete  Transfers Overall transfer level: Needs assistance     Sit to Stand: Min guard         General transfer comment: cues for hand placement with assist to descend to chair today  Ambulation/Gait Ambulation/Gait assistance: Min assist Ambulation Distance (Feet): 23 Feet Assistive device: Rolling walker (2 wheeled) Gait Pattern/deviations: Step-through pattern;Trunk flexed;Decreased stride length   Gait velocity interpretation: Below normal speed for age/gender General Gait Details: cues for posture, position in RW with pt fatiguing quickly propping on bil forearms and required chair pulled to her. Pt unable to regulate or recognize fatigue with  activity today   Stairs            Wheelchair Mobility    Modified Rankin (Stroke Patients Only)       Balance     Sitting balance-Leahy Scale: Good       Standing balance-Leahy Scale: Poor                      Cognition Arousal/Alertness: Awake/alert Behavior During Therapy: Flat affect Overall Cognitive Status: Within Functional Limits for tasks assessed                      Exercises General Exercises - Lower Extremity Long Arc Quad: AROM;Seated;Both;20 reps Hip ABduction/ADduction: Seated;Both;AAROM;20 reps Hip Flexion/Marching: AROM;Seated;Both;20 reps    General Comments        Pertinent Vitals/Pain Pain Assessment: No/denies pain    Home Living                      Prior Function            PT Goals (current goals can now be found in the care plan section) Progress towards PT goals: Not progressing toward goals - comment (limited by fatigue today)    Frequency       PT Plan Current plan remains appropriate    Co-evaluation             End of Session   Activity Tolerance: Patient limited by fatigue Patient left: in chair;with call bell/phone within [name];with chair alarm set     Time: 3419-6222 PT Time Calculation (min) (  ACUTE ONLY): 27 min  Charges:  $Gait Training: 8-22 mins $Therapeutic Exercise: 8-22 mins                    G Codes:      [name] April 23, 2014, 9:25 AM [name], [place]

## 2014-04-07 NOTE — Clinical Social Work Note (Addendum)
CSW spoke with patient concerning PT continuing recommendation for SNF- patient is now agreeable to placement and would prefer [facility] or [facility] neither of which made bed offers- CSW will refer patient again and see if they can reassess.  12:30- updated clinicals faxed to [facility] and [facility] will continue to follow.  Domenica Reamer, [place] Social Worker [phone]

## 2014-04-07 NOTE — Progress Notes (Signed)
RN spoke with Mr. [name], [place] r/t patient's lasix 40mg  Injection and her b/p. PA ok to administered medication since patient's b/p has been sustained in the 90s. Will continue to monitor.  Adyn Serna, RN.

## 2014-04-07 NOTE — Progress Notes (Signed)
Pt weak upon assessment. Nurse tech who had pt last week stated pt was a lot weaker and slower to respond tonight.  MD paged and performed complete neuro assessment.  MD ordered head CT.  VSS.  Will continue to monitor closely.  Claudette Stapler, RN

## 2014-04-07 NOTE — Progress Notes (Signed)
Patient Name: [name] Date of Encounter: 04/07/2014     Active Problems:   Ischemic cardiomyopathy   AICD (automatic cardioverter/defibrillator) present - St Jude   S/P AV nodal ablation, 02/05/14   Sustained ventricular tachycardia   Complete heart block   Persistent atrial fibrillation   Constipated   Protein-calorie malnutrition, severe   Nausea   Hypotension (arterial)    SUBJECTIVE  No CP or SOB. States her legs feels tight. She had a fall on Sat, however can move her legs without significant pain. Also continue to feel weak.  CURRENT MEDS . amiodarone  200 mg Oral Daily  . apixaban  2.5 mg Oral BID  . carvedilol  3.125 mg Oral BID WC  . docusate sodium  100 mg Oral BID  . feeding supplement (RESOURCE BREEZE)  1 Container Oral TID BM  . levothyroxine  200 mcg Oral QAC breakfast  . metoCLOPramide  5 mg Oral TID AC & HS  . mexiletine  200 mg Oral Q12H  . pantoprazole  40 mg Oral BID  . raloxifene  60 mg Oral Daily  . sodium chloride  3 mL Intravenous Q12H  . sucralfate  1 g Oral TID WC & HS    OBJECTIVE  Filed Vitals:   04/06/14 2224 04/07/14 0022 04/07/14 0428 04/07/14 0500  BP: 102/70 98/64 100/53   Pulse: 69  69   Temp: 97.4 F (36.3 C)  98.1 F (36.7 C)   TempSrc: Oral  Oral   Resp: 18  19   Height:      Weight:    124 lb 5.4 oz (56.4 kg)  SpO2: 100%  100%     Intake/Output Summary (Last 24 hours) at 04/07/14 0916 Last data filed at 04/07/14 0800  Gross per 24 hour  Intake    840 ml  Output      0 ml  Net    840 ml   Filed Weights   04/05/14 0500 04/06/14 0621 04/07/14 0500  Weight: 129 lb 6.4 oz (58.695 kg) 127 lb 10.3 oz (57.9 kg) 124 lb 5.4 oz (56.4 kg)    PHYSICAL EXAM  General: Pleasant, NAD. Neuro: Alert and oriented X 3. Moves all extremities spontaneously. Psych: Normal affect. HEENT:  Normal  Neck: Supple without bruits. +JVD. Lungs:  Resp regular and unlabored, CTA. Heart: RRR no s3, s4, or murmurs. Abdomen: Soft,  non-tender, non-distended, BS + x 4.  Extremities: No clubbing, cyanosis or edema. DP/PT/Radials 2+ and equal bilaterally.  Accessory Clinical Findings  CBC No results for input(s): WBC, NEUTROABS, HGB, HCT, MCV, PLT in the last 72 hours. Basic Metabolic Panel  Recent Labs  04/05/14 0440  NA 132*  K 4.5  CL 98  CO2 19  GLUCOSE 104*  BUN 26*  CREATININE 2.57*  CALCIUM 9.1    TELE  Paced rhythm with HR 60s    ECG  No new EKG  Echocardiogram 08/06/2013  LV EF: 15% -  20%  ------------------------------------------------------------------- History:  PMH: Post ICD placement. Rule out pericardial effusion. Rule out tamponade. Coronary artery disease. Congestive heart failure. PMH:  Myocardial infarction. Risk factors: Hypertension. Dyslipidemia.  ------------------------------------------------------------------- Study Conclusions  - Left ventricle: The cavity size was mildly dilated. Wall thickness was normal. Systolic function was severely reduced. The estimated ejection fraction was in the range of 20% to 25%. There is akinesis of the mid-apicalanteroseptal and inferior myocardium. Doppler parameters are consistent with abnormal left ventricular relaxation (grade 1 diastolic dysfunction). - Aortic  valve: There was trivial regurgitation. - Mitral valve: Calcified annulus. There was mild regurgitation. - Right ventricle: The cavity size was mildly dilated. Wall thickness was normal.    Radiology/Studies  Dg Abd 1 View  04/01/2014   CLINICAL DATA:  Nausea.  Vomiting.  EXAM: ABDOMEN - 1 VIEW  COMPARISON:  February 21, 2012.  FINDINGS: The bowel gas pattern is normal. No radio-opaque calculi or other significant radiographic abnormality are seen. Extensive postsurgical changes are seen in the lower lumbar spine.  IMPRESSION: No evidence of bowel obstruction or ileus.   Electronically Signed   By: Sabino Dick M.D.   On: 04/01/2014 15:03   Ct Head  Wo Contrast  04/07/2014   CLINICAL DATA:  Left arm weakness.  EXAM: CT HEAD WITHOUT CONTRAST  TECHNIQUE: Contiguous axial images were obtained from the base of the skull through the vertex without intravenous contrast.  COMPARISON:  07/06/2013.  FINDINGS: The ventricles are normal in configuration. There is ventricular and sulcal enlargement reflecting mild to moderate atrophy, greater than expected for a patient of this age, but stable. No hydrocephalus.  No parenchymal masses or mass effect. Old lacune infarct in the left thalamus. No evidence of a recent cortical infarct. Patchy periventricular white matter hypoattenuation noted consistent with chronic microvascular ischemic change.  No extra-axial masses or abnormal fluid collections.  No intracranial hemorrhage.  Sinuses and mastoid air cells are clear.  No skull lesion.  IMPRESSION: 1. No acute intracranial abnormalities. 2. Atrophy and mild chronic microvascular ischemic change. Small old left thalamus lacunar infarct.   Electronically Signed   By: Lajean Manes M.D.   On: 04/07/2014 01:47   Nm Gastric Emptying  04/02/2014   CLINICAL DATA:  Abdomen pain, nausea and vomiting that started 1 week ago.  EXAM: NUCLEAR MEDICINE GASTRIC EMPTYING SCAN  TECHNIQUE: After oral ingestion of radiolabeled meal, sequential abdominal images were obtained for 120 minutes. Residual percentage of activity remaining within the stomach was calculated at 60 and 120 minutes.  RADIOPHARMACEUTICALS:  2 mCi Technetium 99-m labeled sulfur colloid  COMPARISON:  None.  FINDINGS: Expected location of the stomach in the left upper quadrant. Ingested meal empties the stomach gradually over the course of the study with 84% retention at 60 min and 71% retention at 120 min (normal retention less than 30% at a 120 min).  IMPRESSION: Abnormal gastric emptying study with delayed gastric emptying as described.   Electronically Signed   By: Abelardo Diesel M.D.   On: 04/02/2014 15:02   Dg Chest  Portable 1 View  04/12/2014   CLINICAL DATA:  Chest pain and shortness of breath. Code ST-elevation MI.  EXAM: PORTABLE CHEST - 1 VIEW  COMPARISON:  03/06/2014.  FINDINGS: Right-sided multi lead pacemaker remains in place. Cardiomegaly is unchanged. There is increasing hazy opacity at the left lung base. Inferior-most left costophrenic angle not included in the field of view. Linear atelectasis in the right lower lung zone. Pulmonary vascular congestion, not significantly changed. Kyphoplasty noted in the upper thoracic spine.  IMPRESSION: Increased hazy opacity at the left lung base, likely increasing pleural effusion. Stable vascular congestion and cardiomegaly.   Electronically Signed   By: Jeb Levering M.D.   On: 03/24/2014 23:33    ASSESSMENT AND PLAN  Nausea - issue that is keeping her in hospital - Appreciate Dr. Buccini's/ Schooler's assistance.  - Gastric study abnormal. Idiopathic gastroparesis. Continue reglan and sucralfate. - advancing diet. Outpatient followup with GI  - off spironolactone and lasix  for now during this admission  VT - no further issue at this time, certainly may return  - amio 200 QD (reduced this admit secondary to nausea) - Mexelitine at low dose of 200 BID.   ICM ICD - continue low dose coreg (hypotension)  Acute on chronic renal insufficiency - worsening, unclear etiology, +JVD, however no obvious rale on exam, therefore unclear if fluid overload vs hypoperfusion in the setting of hypotension and low EF 20%. Will discuss with MD. - consider recheck echo, if low, may need RHC to differentiate between hypoperfusion vs fluid overload  AFIB - Eliquis 2.5 BID - Stopping hospital dosed ASA 81. Was not on as outpatient.    Signed, Almyra Deforest PA-C Pager: [phone]

## 2014-04-07 NOTE — Progress Notes (Signed)
Called by nurse that pt seemed like she had some focal weakness and wasn't as 'spunky' as she was previously. I went and assessed patient. VS HR 69 BP 98/64, both stable from previous. On exam, she was A&Ox3. Comprehensive neurologic exam was performed. She had no focal deficit in strength or sensation though she had a very mild L facial droop; unclear if this is old or new. She has a history of AF and is on Eliquis, last dose this evening. She also has a history of prior stroke though unclear if she had any residual deficits.   Plan -Likelihood of new acute stroke is low but will obtain head CT to ensure no focal bleed -Continue to monitor closely -Based on results of scan can decide whether to continue or stop apixaban.  Raliegh Ip, MD MPH

## 2014-04-07 NOTE — Progress Notes (Signed)
Medicare Important Message given? YES  (If response is "NO", the following Medicare IM given date fields will be blank)  Date Medicare IM given: 04/07/14 Medicare IM given by:  Dahlia Client [employer]

## 2014-04-08 ENCOUNTER — Encounter (HOSPITAL_COMMUNITY): Admission: EM | Disposition: E | Payer: Self-pay | Source: Other Acute Inpatient Hospital | Attending: Internal Medicine

## 2014-04-08 DIAGNOSIS — R570 Cardiogenic shock: Secondary | ICD-10-CM

## 2014-04-08 DIAGNOSIS — I5021 Acute systolic (congestive) heart failure: Secondary | ICD-10-CM | POA: Insufficient documentation

## 2014-04-08 HISTORY — PX: CARDIAC CATHETERIZATION: SHX172

## 2014-04-08 HISTORY — PX: RIGHT HEART CATHETERIZATION: SHX5447

## 2014-04-08 LAB — COMPREHENSIVE METABOLIC PANEL
ALT: 35 U/L (ref 0–35)
AST: 75 U/L — ABNORMAL HIGH (ref 0–37)
Albumin: 3.3 g/dL — ABNORMAL LOW (ref 3.5–5.2)
Alkaline Phosphatase: 79 U/L (ref 39–117)
Anion gap: 13 (ref 5–15)
BUN: 41 mg/dL — AB (ref 6–23)
CALCIUM: 9.3 mg/dL (ref 8.4–10.5)
CO2: 16 mmol/L — AB (ref 19–32)
CREATININE: 4.7 mg/dL — AB (ref 0.50–1.10)
Chloride: 103 mEq/L (ref 96–112)
GFR calc Af Amer: 10 mL/min — ABNORMAL LOW (ref >90)
GFR, EST NON AFRICAN AMERICAN: 9 mL/min — AB (ref >90)
Glucose, Bld: 100 mg/dL — ABNORMAL HIGH (ref 70–99)
POTASSIUM: 4.5 mmol/L (ref 3.5–5.1)
Sodium: 132 mmol/L — ABNORMAL LOW (ref 135–145)
TOTAL PROTEIN: 7 g/dL (ref 6.0–8.3)
Total Bilirubin: 1.6 mg/dL — ABNORMAL HIGH (ref 0.3–1.2)

## 2014-04-08 LAB — URINALYSIS, ROUTINE W REFLEX MICROSCOPIC
Glucose, UA: NEGATIVE mg/dL
Ketones, ur: 15 mg/dL — AB
Nitrite: NEGATIVE
Protein, ur: 100 mg/dL — AB
SPECIFIC GRAVITY, URINE: 1.018 (ref 1.005–1.030)
Urobilinogen, UA: 1 mg/dL (ref 0.0–1.0)
pH: 5 (ref 5.0–8.0)

## 2014-04-08 LAB — CBC
HEMATOCRIT: 29 % — AB (ref 36.0–46.0)
Hemoglobin: 10.4 g/dL — ABNORMAL LOW (ref 12.0–15.0)
MCH: 32.5 pg (ref 26.0–34.0)
MCHC: 35.9 g/dL (ref 30.0–36.0)
MCV: 90.6 fL (ref 78.0–100.0)
PLATELETS: 220 10*3/uL (ref 150–400)
RBC: 3.2 MIL/uL — AB (ref 3.87–5.11)
RDW: 16.1 % — ABNORMAL HIGH (ref 11.5–15.5)
WBC: 5.7 10*3/uL (ref 4.0–10.5)

## 2014-04-08 LAB — CREATININE, URINE, RANDOM: CREATININE, URINE: 191.23 mg/dL

## 2014-04-08 LAB — POCT I-STAT 3, VENOUS BLOOD GAS (G3P V)
ACID-BASE DEFICIT: 3 mmol/L — AB (ref 0.0–2.0)
Acid-base deficit: 4 mmol/L — ABNORMAL HIGH (ref 0.0–2.0)
Bicarbonate: 20.3 mEq/L (ref 20.0–24.0)
Bicarbonate: 20.5 mEq/L (ref 20.0–24.0)
O2 SAT: 46 %
O2 Saturation: 50 %
PCO2 VEN: 32.1 mmHg — AB (ref 45.0–50.0)
PCO2 VEN: 32.2 mmHg — AB (ref 45.0–50.0)
PH VEN: 7.414 — AB (ref 7.250–7.300)
PO2 VEN: 25 mmHg — AB (ref 30.0–45.0)
PO2 VEN: 26 mmHg — AB (ref 30.0–45.0)
TCO2: 21 mmol/L (ref 0–100)
TCO2: 21 mmol/L (ref 0–100)
pH, Ven: 7.407 — ABNORMAL HIGH (ref 7.250–7.300)

## 2014-04-08 LAB — CARBOXYHEMOGLOBIN
Carboxyhemoglobin: 0.8 % (ref 0.5–1.5)
Methemoglobin: 0.8 % (ref 0.0–1.5)
O2 Saturation: 40.8 %
Total hemoglobin: 10.9 g/dL — ABNORMAL LOW (ref 12.0–16.0)

## 2014-04-08 LAB — SODIUM, URINE, RANDOM: Sodium, Ur: 21 mmol/L

## 2014-04-08 LAB — URINE MICROSCOPIC-ADD ON

## 2014-04-08 LAB — BRAIN NATRIURETIC PEPTIDE: B Natriuretic Peptide: 2897.7 pg/mL — ABNORMAL HIGH (ref 0.0–100.0)

## 2014-04-08 LAB — CREATININE, SERUM
CREATININE: 5 mg/dL — AB (ref 0.50–1.10)
GFR, EST AFRICAN AMERICAN: 10 mL/min — AB (ref >90)
GFR, EST NON AFRICAN AMERICAN: 8 mL/min — AB (ref >90)

## 2014-04-08 LAB — MRSA PCR SCREENING: MRSA by PCR: NEGATIVE

## 2014-04-08 SURGERY — RIGHT HEART CATH

## 2014-04-08 MED ORDER — SODIUM CHLORIDE 0.9 % IJ SOLN
3.0000 mL | Freq: Two times a day (BID) | INTRAMUSCULAR | Status: DC
  Administered 2014-04-09: 3 mL via INTRAVENOUS

## 2014-04-08 MED ORDER — SODIUM CHLORIDE 0.9 % IJ SOLN: 3.0000 mL | INTRAMUSCULAR | Status: DC | PRN

## 2014-04-08 MED ORDER — SODIUM CHLORIDE 0.9 % IV SOLN: 250.0000 mL | INTRAVENOUS | Status: DC | PRN

## 2014-04-08 MED ORDER — ENOXAPARIN SODIUM 30 MG/0.3ML ~~LOC~~ SOLN
30.0000 mg | SUBCUTANEOUS | Status: DC
  Filled 2014-04-08 (×2): qty 0.3

## 2014-04-08 MED ORDER — SODIUM CHLORIDE 0.9 % IV SOLN: INTRAVENOUS | Status: DC

## 2014-04-08 MED ORDER — SODIUM CHLORIDE 0.9 % IJ SOLN: 3.0000 mL | Freq: Two times a day (BID) | INTRAMUSCULAR | Status: DC

## 2014-04-08 MED ORDER — ACETAMINOPHEN 325 MG PO TABS: 650.0000 mg | ORAL_TABLET | ORAL | Status: DC | PRN

## 2014-04-08 MED ORDER — SODIUM BICARBONATE 8.4 % IV SOLN
50.0000 meq | Freq: Once | INTRAVENOUS | Status: AC
  Administered 2014-04-08: 50 meq via INTRAVENOUS
  Filled 2014-04-08: qty 50

## 2014-04-08 MED ORDER — SODIUM BICARBONATE 650 MG PO TABS
650.0000 mg | ORAL_TABLET | Freq: Three times a day (TID) | ORAL | Status: DC
  Administered 2014-04-08 (×2): 650 mg via ORAL
  Filled 2014-04-08 (×5): qty 1

## 2014-04-08 MED ORDER — ONDANSETRON HCL 4 MG/2ML IJ SOLN
4.0000 mg | Freq: Four times a day (QID) | INTRAMUSCULAR | Status: DC | PRN
Start: 2014-04-08 — End: 2014-04-09

## 2014-04-08 MED ORDER — NOREPINEPHRINE BITARTRATE 1 MG/ML IV SOLN
0.0000 ug/min | INTRAVENOUS | Status: DC
  Administered 2014-04-08: 10 ug/min via INTRAVENOUS
  Filled 2014-04-08: qty 16

## 2014-04-08 MED ORDER — LIDOCAINE HCL (PF) 1 % IJ SOLN
INTRAMUSCULAR | Status: AC
  Filled 2014-04-08: qty 30

## 2014-04-08 MED ORDER — HEPARIN (PORCINE) IN NACL 2-0.9 UNIT/ML-% IJ SOLN
INTRAMUSCULAR | Status: AC
Start: 2014-04-08 — End: 2014-04-08
  Filled 2014-04-08: qty 500

## 2014-04-08 NOTE — CV Procedure (Addendum)
Cardiac Cath Procedure Note:  Indication: Heart failure/shock  Procedures performed:  1) Right heart catheterization 2) Swan-Ganz catheter placement  Description of procedure:   The risks and indication of the procedure were explained. Consent was signed and placed on the chart. An appropriate timeout was taken prior to the procedure. The right neck was prepped and draped in the routine sterile fashion and anesthetized with 1% local lidocaine.   An 8 FR venous sheath was placed in the right internal jugular vein using a modified Seldinger technique. A standard Swan-Ganz catheter was used for the procedure. The Gordy Councilman placement was confirmed under fluoro and the sheath was sutured in place.   Complications: None apparent.  Findings:  RA = 22 (v-waves = 38) RV = 40/21 (31) PA = 42/22 (30) PCW = 22 Fick cardiac output/index = 2.8/1.7 Thermo CO/CI = 1.7/1.1 PVR (Fick) = 2.8 WU PVR (thermo) = 4.7 WU Ao sat = 99% PA sat = 46%, 50%  Assessment:  She has moderately elevated biventricular pressures with severe biventricular failure and cardiogenic shock physiology. Prominent v waves in RA tracing suggestive of severe TR.  Will start levophed. Stop carvedilol.  Continue attempts at gentle diuresis. Prognosis poor. Will discuss options with family.   Glori Bickers MD 4:17 PM

## 2014-04-08 NOTE — Progress Notes (Signed)
Advanced Heart Failure Rounding Note   Subjective:    [name] is a 64 year old with a history of ICM (s/p ICD implantation in 2005; gen change 2006; dual chamber upgrade 2014 with subsequent extraction due to infection 11/2012, CRT-D implant 10/2503), chronic systolic heart failure, CAD last intervention 2004, last cath 06/2013 (patent the LAD and circumflex stents), myoview 04/2013 with no ischemia), prior CVA, chronic kidney disease, atrial arrhythmias, and sustained VT.    Last HF admit was in March 2015 and she required milrinone as well as IV lasix. Had [place] 05/2013 RA mean 10 RV 50/13, PA 49/21, mean 31, PCWP mean 21, Oxygen saturations PA 50%, AO 94%, Cardiac Output (Fick) 3.4  Cardiac Index (Fick) 1.9 PVR (Fick) 2.9 WU. Cardiac Output (Thermodilution) 2.2Cardiac Index (Thermodilution) 1.2.   Discharge weight was 139 pounds.   Admitted 03/31/14  with slow VT that was below detection rate. ICD reprogrammed. . Amiodarone increased, mexilitine continued, but developed N&V, diarrhea, amio dose decreased and GI called. Gastric Study abnormal--.with 71% retention of the test meal at 2 hours (normal is 30% or less).  GI recommended Metoclopramide and Sucralfate. Thought to have gastroparesis likely idiopathic. Early this am she had focal weakness and mild L facial droop. CT of head - no acute abnormalities noted.   While hospitalized HF has been relativelly stable until yesterday. Concern for volume overload with worsening renal function. On  1/14 diuretics held due to elevated creatinine. Yesterday she was given  40 mg IV lasix x1 with weight up 1 pound today. Renal function worsened today. Dyspneic at rest and fatigue. No nausea.    Creatinine 1.59> 2.1>2.5>4.7   Objective:   Weight Range:  Vital Signs:   Temp:  [97.4 F (36.3 C)-97.8 F (36.6 C)] 97.6 F (36.4 C) (01/19 0435) Pulse Rate:  [68-72] 72 (01/19 0810) Resp:  [16-18] 16 (01/19 0435) BP: (88-99)/(42-59) 99/44 mmHg (01/19  0810) SpO2:  [98 %-100 %] 100 % (01/19 0435) Weight:  [125 lb 7.1 oz (56.9 kg)] 125 lb 7.1 oz (56.9 kg) (01/19 0435) Last BM Date: 04/07/14  Weight change: Filed Weights   04/06/14 0621 04/07/14 0500 03/29/2014 0435  Weight: 127 lb 10.3 oz (57.9 kg) 124 lb 5.4 oz (56.4 kg) 125 lb 7.1 oz (56.9 kg)    Intake/Output:   Intake/Output Summary (Last 24 hours) at 03/24/2014 1234 Last data filed at 04/18/2014 0830  Gross per 24 hour  Intake    600 ml  Output      0 ml  Net    600 ml     Physical Exam: General:  Dyspneic at rest. Chronically ill appearing.  HEENT: normal Neck: supple. JVP to ear . Carotids 2+ bilat; no bruits. No lymphadenopathy or thryomegaly appreciated. Cor: PMI laterally displaced. Regular rate & rhythm. +s3 Lungs: clear Abdomen: soft, nontender, nondistended. No hepatosplenomegaly. No bruits or masses. Good bowel sounds. Extremities: no cyanosis, clubbing, rash, R and L no edema. Extremities cool. edema Neuro: alert & orientedx3, cranial nerves grossly intact. moves all 4 extremities w/o difficulty. Affect pleasant  Telemetry:  AV paced 70s   Labs: Basic Metabolic Panel:  Recent Labs Lab 04/03/14 0510 04/04/14 0611 04/05/14 0440 04/20/2014 0404  NA 138 133* 132* 132*  K 4.0 4.3 4.5 4.5  CL 100 104 98 103  CO2 22 22 19  16*  GLUCOSE 118* 92 104* 100*  BUN 18 20 26* 41*  CREATININE 2.11* 2.11* 2.57* 4.70*  CALCIUM 9.1 8.8 9.1 9.3  MG 1.8  --   --   --     Liver Function Tests:  Recent Labs Lab 03/22/2014 0404  AST 75*  ALT 35  ALKPHOS 79  BILITOT 1.6*  PROT 7.0  ALBUMIN 3.3*   No results for input(s): LIPASE, AMYLASE in the last 168 hours. No results for input(s): AMMONIA in the last 168 hours.  CBC: No results for input(s): WBC, NEUTROABS, HGB, HCT, MCV, PLT in the last 168 hours.  Cardiac Enzymes: No results for input(s): CKTOTAL, CKMB, CKMBINDEX, TROPONINI in the last 168 hours.  BNP: BNP (last 3 results)  Recent Labs  06/04/13 0957  06/18/13 1327 02/05/14 2230  PROBNP 2458.0* 1223.0* 5151.0*     Other results:  EKG: 1/7 AV dissociation with v pacing  Imaging: Ct Head Wo Contrast  04/07/2014   CLINICAL DATA:  Left arm weakness.  EXAM: CT HEAD WITHOUT CONTRAST  TECHNIQUE: Contiguous axial images were obtained from the base of the skull through the vertex without intravenous contrast.  COMPARISON:  07/06/2013.  FINDINGS: The ventricles are normal in configuration. There is ventricular and sulcal enlargement reflecting mild to moderate atrophy, greater than expected for a patient of this age, but stable. No hydrocephalus.  No parenchymal masses or mass effect. Old lacune infarct in the left thalamus. No evidence of a recent cortical infarct. Patchy periventricular white matter hypoattenuation noted consistent with chronic microvascular ischemic change.  No extra-axial masses or abnormal fluid collections.  No intracranial hemorrhage.  Sinuses and mastoid air cells are clear.  No skull lesion.  IMPRESSION: 1. No acute intracranial abnormalities. 2. Atrophy and mild chronic microvascular ischemic change. Small old left thalamus lacunar infarct.   Electronically Signed   By: Lajean Manes M.D.   On: 04/07/2014 01:47      Medications:     Scheduled Medications: . amiodarone  200 mg Oral Daily  . apixaban  2.5 mg Oral BID  . carvedilol  3.125 mg Oral BID WC  . docusate sodium  100 mg Oral BID  . feeding supplement (RESOURCE BREEZE)  1 Container Oral TID BM  . levothyroxine  200 mcg Oral QAC breakfast  . metoCLOPramide  5 mg Oral TID AC & HS  . mexiletine  200 mg Oral Q12H  . pantoprazole  40 mg Oral BID  . raloxifene  60 mg Oral Daily  . sodium chloride  3 mL Intravenous Q12H  . sucralfate  1 g Oral TID WC & HS     Infusions:     PRN Medications:  [CANCELED] Place/Maintain arterial line **AND** sodium chloride, acetaminophen, clonazePAM, ondansetron (ZOFRAN) IV, polyethylene glycol   Assessment:  1 A/C  Systolic HF EF 91-47% 10/2954 Ischemic cardiomyopathy  2.  AICD (automatic cardioverter/defibrillator) present - St Jude 3.  S/P AV nodal ablation, 02/05/14 4. V Tach on admit- ICD reprogrammed. on amiodarone 200 mg daily. Intolerant amio twice a day due to nausea. Mexelitine 200 mg  BID 5.  Persistent atrial fibrillation -Mali Vasc Score 6. On Eliquis 2.5 mg twice a day 6.  Hypotension  7.  Cardio Renal Syndrome.  8. Gastroparesis likely idiopathic per GI    Plan/Discussion:    NYHA IV dyspneic at rest. Extremities cool. Give 80 mg IV lasix now. Will need swan placement today for suspected cariogenic shock. Will leave swan in to guide therapies. Anticipate adding milrinone. Has low BP may need Levo as well.   Renal consult pending.    No VT over night. Continue amiodarone  200 mg daily and mexilitene 200 mg twice a day. Check magnesium now.    Length of Stay: 12   CLEGG,AMY NP-C  04/02/2014, 12:34 PM  Advanced Heart Failure Team Pager [phone] (M-F; [place])  Please contact [facility] for night-coverage after hours (4p -7a ) and weekends on [email or web address]  Patient seen and examined with Darrick Grinder, NP. We discussed all aspects of the encounter. I agree with the assessment and plan as stated above.   She is in cardiogenic shock with worsening renal function. Will need swan placement and likely inotropic support. Long-term prognosis quite poor. Will take to cath lab for [facility] placement today.   The patient is critically ill with multiple organ systems failure and requires high complexity decision making for assessment and support, frequent evaluation and titration of therapies, application of advanced monitoring technologies and extensive interpretation of multiple databases.   Critical Care Time personally devoted to patient care services described in this note is 35 Minutes.  Benay Spice 3:33 PM

## 2014-04-08 NOTE — Progress Notes (Addendum)
Patient's lasix and spironolactone has been held for several days, however her renal function continue to worse. Yesterday, she had no obvious rale on exam however had +JVD, it was unclear whether she had fluid overload after stopping lasix vs hypoperfusion. We did a trial of IV lasix, unfortunately, her Cr went up to 4.7. High suspicion for low output syndrome. I have consulted HF service. Will transfer pt to [place] for potential swan catheter this afternoon. I have discussed with patient and family as well. I will also get nephrology on board.  Hilbert Corrigan PA Pager: [phone]   Discussed with Dr. Lorrene Reid, nephrology on board.  Hilbert Corrigan PA Pager: [phone]  [facility] and CHF service recommendations. She will need RHC - suspect will need milrinone for recurrent low output heart failure, likely the cause of her ongoing nausea. To clarify, her creatinine increased from 2.57 to 4.7 over 3 days - I assumed care of her yesterday and ordered lasix as she was clearly volume overloaded - however, no labs had been ordered the preceeding 2 days.   Pixie Casino, MD, [facility] Attending Cardiologist [place]

## 2014-04-08 NOTE — Consult Note (Signed)
Requesting Physician:  Dr. Debara Pickett  Reason for Consult:  Acute kidney injury HPI: The patient is a 64 y.o. year-old woman with PMH most significant for CAD, ischemic cardiomypathy with EF 15-20%, prior admissions for acute on chronic systolic heart failure, has ICD in place, admitted 1/7 with chest prerssure associated with WCT - slow VTach. She was cardioverted chemically in the ED with lidocaine and amio. ICD was reprogrammed, K and Mg supplemented. Had relative hypotension with blood pressures documented 90's mostly, some into the 60-70 range. Also issues with nausea, evaluated by GI, found to have some degree of gastroparesis, given reglan and carafate with some improvement.   Baseline creatinine appears to bounce around some, was around 1 during a brief December admission for fire of ICD, 1.4-1.6 during the earlier part of the current admission, 1.59 on 1/11, checked again on 1/14 up to 2.11 (diuretics held) , 2.57 on 1/16 and then not checked again until today - found to be 4.7 with BUN 41, bicarb down to 16.  She has had heart failure symptoms - unclear of fluid overload or hypoperfusion. She is transferred to the CCU [facility] placement and we are asked to see.  Creatinine trending over the past 6 months is as outlined below. CREATININE, SER  Date/Time Value Ref Range Status  04/04/2014 04:04 AM 4.70* 0.50 - 1.10 mg/dL Final       04/05/2014 04:40 AM 2.57* 0.50 - 1.10 mg/dL Final  04/04/2014 06:11 AM 2.11* 0.50 - 1.10 mg/dL Final  04/03/2014 05:10 AM 2.11* 0.50 - 1.10 mg/dL Final       03/31/2014 03:19 AM 1.59* 0.50 - 1.10 mg/dL Final  03/30/2014 03:46 AM 1.60* 0.50 - 1.10 mg/dL Final  03/29/2014 01:38 PM 1.72* 0.50 - 1.10 mg/dL Final  03/28/2014 05:40 AM 1.32* 0.50 - 1.10 mg/dL Final  04/19/2014 11:08 PM 1.40* 0.50 - 1.10 mg/dL Final  04/02/2014 10:59 PM 1.57* 0.50 - 1.10 mg/dL Final       03/07/2014 04:03 AM 1.06 0.50 - 1.10 mg/dL Final  03/06/2014 01:41 PM 0.96 0.50 - 1.10 mg/dL Final        02/06/2014 07:24 AM 1.28* 0.50 - 1.10 mg/dL Final  02/05/2014 10:35 AM 1.38* 0.50 - 1.10 mg/dL Final  01/27/2014 12:19 PM 1.4* 0.4 - 1.2 mg/dL Final  01/18/2014 10:00 AM 1.56* 0.50 - 1.10 mg/dL Final  01/17/2014 03:18 AM 1.47* 0.50 - 1.10 mg/dL Final  01/16/2014 01:25 PM 1.28* 0.50 - 1.10 mg/dL Final  12/06/2013 11:47 AM 1.4* 0.4 - 1.2 mg/dL Final  09/30/2013 03:00 AM 1.00 0.50 - 1.10 mg/dL Final  09/29/2013 04:26 PM 0.90 0.50 - 1.10 mg/dL Final  09/27/2013 12:16 PM 0.80 0.50 - 1.10 mg/dL Final   She has had no exposure to contrast, ACE, ARB, NSAID; no diuretics for several days with exception of a single dose of furosemide yesterday to which response is unknown. Urine output is not being recorded at all. Has had relative as well as absolute hypotension throughout the current admission.  Appears on prior admissions systolic BP's have run in the 100-120's - this admission most pressures 80's-90's.  Her weight does not appear to have changed much during the current admission and has varied from a low of 54.4 to a high of 58.6, around 56-57 last few days (various scales)  Yesterday she apparently became unable to speak (can talk but speaks in a whisper and defers answers to questions to her brother) Head CT was done and showed no acute changes  Says has made no urine since yesterday and then was "just a little bit"  Past Medical History  Diagnosis Date  . Spinal stenosis   . Numbness of foot   . Diverticulitis     a. s/p partial colectomy 1/12 with reversal in July 2012.  . Colitis, ischemic   . Ischemic cardiomyopathy     a. Echo 06/16/10: EF 25-30%, anteroseptal and apical hypokinesis, moderate AI, mild MR.   . LV (left ventricular) mural thrombus   . CKD (chronic kidney disease), stage III   . GERD (gastroesophageal reflux disease)   . Hypertension   . Hypothyroidism   . Hyperlipidemia   . Compression fracture     a. of L2  . Inappropriate shocks from ICD (implantable  cardioverter-defibrillator)      a. 2/2 atrial fibrillation with RVR in the context of hypokalemia  . PAF (paroxysmal atrial fibrillation)     a. on Eliquis, digoxin, and amiodarone  . Anemia   . H/O hiatal hernia   . Sinus bradycardia   . Mitral regurgitation   . Chronic systolic CHF (congestive heart failure)   . CAD (coronary artery disease)     a.  Ant MI 8/03 with stenting to LAD;  b. staged PCI w/ DES to OM1 8/03;  c. s/p DES to LAD 7/04;  d.  multiple caths in past (chronically abnl ECG); e. cardiac cath 3/12: LAD stent patent, distal LAD 40-50%, small ostial D1 80%, ostial D2 60%, OM-1 stent patent (20-30%), proximal RCA 50%, prox- mRCA 40-50%; f. cath 2012 nonobs g. cath 06/2013 and 08/2013 for "STEMI" - mild nonobst CAD only.  [name] History of TIA (transient ischemic attack)   . Paroxysmal atrial tachycardia   . Paroxysmal atrial flutter   . Osteoporosis   . Oral thrush     a. By EGD 09/2013.  [name] Antral gastritis     a. By EGD 09/2013 - minimal.  . Ventricular tachycardia     a. 01/16/14 s/p amiodarone loading and ICD reprogramming  . 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 s/p ICD implantation in 2005. b. Gen change 2006. 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 c. s/p CRT-D implantation 07/2013. St Jude  . Heart murmur   . Myocardial infarction 2000  . History of blood transfusion     "related to knee replacement and ?"  . Stroke     "I've had ~ 4; 3 in left eye and 1 w/ablation in [place]" ; denies residual on 03/04/2014(  . DJD (degenerative joint disease)   . Arthritis     "all over"    Past Surgical History  Procedure Laterality Date  . Cardiac defibrillator placement  07/2003; 10/2004; 2014  . Lumbar laminectomy/decompression microdiscectomy  10/2001    L5-S1/E-chart  . Knee arthroscopy Right   . Thyroidectomy  1970's  . Total knee arthroplasty Left 11/2003  . X-stop implantation  12/2004     L3-4; L4-5  . Fixation kyphoplasty thoracic spine  07/2007    T3, 4, 6 compression fractures  . Shoulder open rotator cuff repair Right 04/2008  . Lumbar laminectomy/decompression microdiscectomy  01/2010  . Peripherally inserted central catheter insertion  03/2010    removed upon discharge  . Colostomy reversal  12/2010  . Tonsillectomy and adenoidectomy  1969  . Appendectomy  03/2010  . Glaucoma surgery Right   . Cardiac defibrillator removal  12/17/2012    removed for infection  .  Icd lead removal Left 12/17/2012    Procedure: ICD LEAD REMOVAL;  Surgeon: Evans Lance, MD;  Location: [facility];  Service: Cardiovascular;  Laterality: Left;  . Bi-ventricular implantable cardioverter defibrillator  (crt-d)  08/05/2013    STJ Quadra Assura CRTD implanted by Dr Caryl Comes (right sided)  . Esophagogastroduodenoscopy N/A 10/02/2013    Procedure: ESOPHAGOGASTRODUODENOSCOPY (EGD);  Surgeon: Lear Ng, MD;  Location[facility] ENDOSCOPY;  Service: Endoscopy;  Laterality: N/A;  . Ablation of dysrhythmic focus  11/20/2013    [facility]  . Left heart catheterization with coronary angiogram N/A 02/17/2011    Procedure: LEFT HEART CATHETERIZATION WITH CORONARY ANGIOGRAM;  Surgeon: Peter M Martinique, MD;  Location: [facility] CATH LAB;  Service: Cardiovascular;  Laterality: N/A;  . Implantable cardioverter defibrillator revision N/A 06/07/2012    Procedure: IMPLANTABLE CARDIOVERTER DEFIBRILLATOR REVISION;  Surgeon: Thompson Grayer, MD;  Location: [facility] CATH LAB;  Service: Cardiovascular;  Laterality: N/A;  . Right heart catheterization N/A 06/06/2013    Procedure: RIGHT HEART CATH;  Surgeon: Larey Dresser, MD;  Location: [facility] CATH LAB;  Service: Cardiovascular;  Laterality: N/A;  . Left heart catheterization with coronary angiogram Bilateral 07/06/2013    Procedure: LEFT HEART CATHETERIZATION WITH CORONARY ANGIOGRAM;  Surgeon: Burnell Blanks, MD;  Location: [facility] CATH LAB;  Service: Cardiovascular;  Laterality: Bilateral;   . Bi-ventricular implantable cardioverter defibrillator N/A 08/05/2013    Procedure: BI-VENTRICULAR IMPLANTABLE CARDIOVERTER DEFIBRILLATOR  (CRT-D);  Surgeon: Deboraha Sprang, MD;  Location: [facility] CATH LAB; [employer] Jude  . Av node ablation N/A 02/05/2014    Procedure: AV NODE ABLATION;  Surgeon: Evans Lance, MD;  Location: [facility] CATH LAB;    . Joint replacement    . Knee arthroscopy Right 1980's  . Back surgery  X 4  . Coronary angioplasty with stent placement  10/2001; 09/2002; ?date    "I've got a total of 4 or 5" (03/06/2014)  . Av node ablation  02/07/2014  . Colectomy  03/2010    sigmoid left; transverse    Family History  Problem Relation Age of Onset  . Diabetes Mother   . Diabetes Brother   . Arthritis      family history  . Prostate cancer      Family History   Social History:  reports that she quit smoking about 12 years ago. Her smoking use included Cigarettes. She has a 1.5 pack-year smoking history. She has never used smokeless tobacco. She reports that she drinks about 0.6 oz of alcohol per week. She reports that she does not use illicit drugs.  Allergies:  Allergies  Allergen Reactions  . Coconut Oil Anaphylaxis and Hives  . Lansoprazole Nausea Only  . Penicillins Swelling    Throat swells  . Statins Other (See Comments)    cramps  . Lisinopril     Cough- but still tolerates it  . Lactose Intolerance (Gi) Diarrhea and Other (See Comments)    Patient reports abdominal cramping and diarrhea with lactose products.     Home medications: Prior to Admission medications   Medication Sig Start Date End Date Taking? Authorizing Provider  acetaminophen (TYLENOL) 325 MG tablet Take 2 tablets (650 mg total) by mouth every 4 (four) hours as needed for headache or mild pain. 02/07/14  Yes Isaiah Serge, NP  amiodarone (PACERONE) 200 MG tablet Take 1 tablet (200 mg total) by mouth daily. 03/07/14  Yes Thompson Grayer, MD  apixaban (ELIQUIS) 5 MG TABS tablet Take 1 tablet (5 mg  total)  by mouth 2 (two) times daily. 09/02/13  Yes Jolaine Artist, MD  Buprenorphine (BUTRANS) 7.5 MCG/HR PTWK Place 1 patch onto the skin once a week.   Yes Historical Provider, MD  carvedilol (COREG) 3.125 MG tablet Take 2 tablets (6.25 mg total) by mouth 2 (two) times daily with a meal. Patient taking differently: Take 3.125 mg by mouth 2 (two) times daily with a meal.  03/07/14  Yes Thompson Grayer, MD  clonazePAM (KLONOPIN) 0.5 MG tablet Take 1 tablet (0.5 mg total) by mouth 2 (two) times daily as needed for anxiety. 09/16/13  Yes Janith Lima, MD  fluticasone (FLONASE) 50 MCG/ACT nasal spray Place 2 sprays into both nostrils daily as needed for allergies.    Yes Historical Provider, MD  furosemide (LASIX) 40 MG tablet Take 1 tablet (40 mg total) by mouth every other day. 02/07/14  Yes Isaiah Serge, NP  levothyroxine (SYNTHROID, LEVOTHROID) 200 MCG tablet Take 200 mcg by mouth daily before breakfast.    Yes Historical Provider, MD  mexiletine (MEXITIL) 200 MG capsule Take 1 capsule (200 mg total) by mouth every 12 (twelve) hours. 03/07/14  Yes Thompson Grayer, MD  nitroGLYCERIN (NITROSTAT) 0.4 MG SL tablet Place 0.4 mg under the tongue every 5 (five) minutes as needed for chest pain.    Yes Historical Provider, MD  omega-3 acid ethyl esters (LOVAZA) 1 G capsule Take 1 g by mouth daily.   Yes Historical Provider, MD  ondansetron (ZOFRAN-ODT) 4 MG disintegrating tablet Take 4 mg by mouth 2 (two) times daily as needed for nausea or vomiting.   Yes Historical Provider, MD  oxyCODONE-acetaminophen (PERCOCET/ROXICET) 5-325 MG per tablet Take 1 tablet by mouth every 6 (six) hours as needed for moderate pain.  02/24/14  Yes Historical Provider, MD  pantoprazole (PROTONIX) 40 MG tablet Take 1 tablet (40 mg total) by mouth 2 (two) times daily. 07/12/13  Yes Janith Lima, MD  polyethylene glycol [facility] / GLYCOLAX) packet Take 17 g by mouth daily as needed (for constipation).    Yes Historical Provider, MD   raloxifene (EVISTA) 60 MG tablet Take 60 mg by mouth daily.   Yes Historical Provider, MD  spironolactone (ALDACTONE) 25 MG tablet Take 0.5 tablets (12.5 mg total) by mouth daily. 02/07/14  Yes Isaiah Serge, NP    Inpatient medications: . amiodarone  200 mg Oral Daily  . apixaban  2.5 mg Oral BID  . carvedilol  3.125 mg Oral BID WC  . docusate sodium  100 mg Oral BID  . feeding supplement (RESOURCE BREEZE)  1 Container Oral TID BM  . levothyroxine  200 mcg Oral QAC breakfast  . metoCLOPramide  5 mg Oral TID AC & HS  . mexiletine  200 mg Oral Q12H  . pantoprazole  40 mg Oral BID  . raloxifene  60 mg Oral Daily  . sodium chloride  3 mL Intravenous Q12H  . sucralfate  1 g Oral TID WC & HS    Review of Systems +SOB rest and with any exertion "Can't speak" (actually IS speaking but points and defers to brother to answer questions No Chest pain + back pain (chronic) No leg edema Not making any urine by her own report since yesterday   Physical Exam:  BP 99/44 mmHg  Pulse 72  Temp(Src) 97.6 F (36.4 C) (Oral)  Resp 16  Ht 5\' 4"  (1.626 m)  Wt 56.9 kg (125 lb 7.1 oz)  BMI 21.52 kg/m2  SpO2  100%  Gen: Frail AAF looks older than her stated age of 41 VS as noted Awake but depressed looking, speaks in a whisper Skin: no rash, cyanosis Neck: +JVD to the jaw angle Chest: Crackles bilat bases Heart: regular, no rub or gallop Abdomen: Non-distended. + BS. Liver edge below RCM and mildly tender. No bruits heard Ext: No LE edema. Very small muscle mass Neuro: Awake but depressed affect. Oriented to person, place. Moves all exts but speaks in a whisper Heme/Lymph: no bruising or LAN  Recent Labs Lab 04/03/14 0510 04/04/14 0611 04/05/14 0440 03/30/2014 0404  NA 138 133* 132* 132*  K 4.0 4.3 4.5 4.5  CL 100 104 98 103  CO2 22 22 19  16*  GLUCOSE 118* 92 104* 100*  BUN 18 20 26* 41*  CREATININE 2.11* 2.11* 2.57* 4.70*  CALCIUM 9.1 8.8 9.1 9.3    Recent Labs Lab  04/15/2014 0404  AST 75*  ALT 35  ALKPHOS 79  BILITOT 1.6*  PROT 7.0  ALBUMIN 3.3*   Xrays/Other Studies: Ct Head Wo Contrast  04/07/2014   CLINICAL DATA:  Left arm weakness.  EXAM: CT HEAD WITHOUT CONTRAST  TECHNIQUE: Contiguous axial images were obtained from the base of the skull through the vertex without intravenous contrast.  COMPARISON:  07/06/2013.  FINDINGS: The ventricles are normal in configuration. There is ventricular and sulcal enlargement reflecting mild to moderate atrophy, greater than expected for a patient of this age, but stable. No hydrocephalus.  No parenchymal masses or mass effect. Old lacune infarct in the left thalamus. No evidence of a recent cortical infarct. Patchy periventricular white matter hypoattenuation noted consistent with chronic microvascular ischemic change.  No extra-axial masses or abnormal fluid collections.  No intracranial hemorrhage.  Sinuses and mastoid air cells are clear.  No skull lesion.  IMPRESSION: 1. No acute intracranial abnormalities. 2. Atrophy and mild chronic microvascular ischemic change. Small old left thalamus lacunar infarct.   Electronically Signed   By: Lajean Manes M.D.   On: 04/07/2014 01:47   Background 64 yo female with severe cardiomyopathy with systolic heart failure, admitted with WCT = slow ventricular tachycardia "under range of detection of ICD" per Dr. Caryl Comes, controlled with medications.  Over the course of her hospitalization has had recurrent issues with nausea, then overt heart failure symptoms, with a progressive worsening in renal function over the past week with a creatinine baseline around 1 in December, 1.4-1.6 during the first week of hospitalization, and in the past week progressive rise to 4.7  With accompanying metabolic acidosis, and we are asked to see.   Impression/Plan 1. AKI on CKD3 - suspect cardiorenal syndrome/low output heart failure as primary etiology. Now developing a metabolic acidosis which is  contributing to her dyspnea. Unknown if oliguric or not as no UOP has been recorded - but she says no UOP for a day.  For Kenefic later today to evaluate for IV inotropes. With low BP, EF of 15-20% and renal failure, prognosis would appear grim.  Dialysis, if comes to that, would likely be poorly tolerated if at all.  Agree with RHC, would insert foley, send urine Na/creatinine and urinalysis, obtain renal ultrasound. IV sodium bicarb X 1 amp, then start po. Had limited discussion with pt and her brother (pt did not participate much) about dialysis options - made him aware that she could not tolerate conventional dialysis and that if continued worsening + aggressive approach desired, CRRT only immediate option.  2. Cardiomyopathy with systolic heart  failure - for RHC today. EF last documented 15-20% by ECHO 07/2013. Prognosis to me would look limited. See #1 discussion. 3. S/p WCT - chemically converted 4. Hypotension - d/t #2 5. CNS - unclear to me what her issue is with verbalization - CAN speak but only in a whisper.  CT negative.  Jamal Maes,  MD [facility] [phone] pager 03/31/2014, 12:58 PM

## 2014-04-08 NOTE — Progress Notes (Signed)
2 Pressure Bags set up for procedure, however, procedure being done in cath lab. Will take to cath lab with patient. RN aware.

## 2014-04-08 NOTE — Progress Notes (Signed)
Pharmacy note: apixiban  64 yo female on apixiban for afib (CHADSVASC= 6; HF, HTN, hx MI, female, CVA) and Scr increasing (SCr= 4.7 today  and was 2.57 on 1/16 and 2.11 on 1/15).  In setting of increasing SCr there is risk of drug accumulation.  -Spoke with Dr. Debara Pickett: Due to risk of drug accumulation discontinue apixiban for now.  No heparin therapy now but will reconsider 04/09/13.  May need to consider change to coumadin based on renal trend.  Plan -Discontinue apixiban -Consider change to heparin infusion on 04/09/13 -May need to consider change to coumadin  Hildred Laser, Pharm D 04/05/2014 1:39 PM

## 2014-04-08 NOTE — Progress Notes (Signed)
Rept given to [employer] on 2 heart. Pt being transferred to 2 heart room 9 per MD order. Pt and brother aware.

## 2014-04-09 ENCOUNTER — Inpatient Hospital Stay (HOSPITAL_COMMUNITY): Payer: Medicare Other

## 2014-04-09 ENCOUNTER — Encounter (HOSPITAL_COMMUNITY): Payer: Self-pay | Admitting: Internal Medicine

## 2014-04-09 LAB — RENAL FUNCTION PANEL
ALBUMIN: 2.9 g/dL — AB (ref 3.5–5.2)
Anion gap: 14 (ref 5–15)
BUN: 50 mg/dL — AB (ref 6–23)
CALCIUM: 8.9 mg/dL (ref 8.4–10.5)
CO2: 20 mmol/L (ref 19–32)
CREATININE: 5.37 mg/dL — AB (ref 0.50–1.10)
Chloride: 99 mEq/L (ref 96–112)
GFR calc Af Amer: 9 mL/min — ABNORMAL LOW (ref >90)
GFR calc non Af Amer: 8 mL/min — ABNORMAL LOW (ref >90)
Glucose, Bld: 142 mg/dL — ABNORMAL HIGH (ref 70–99)
Phosphorus: 6 mg/dL — ABNORMAL HIGH (ref 2.3–4.6)
Potassium: 4.4 mmol/L (ref 3.5–5.1)
Sodium: 133 mmol/L — ABNORMAL LOW (ref 135–145)

## 2014-04-09 LAB — CBC
HCT: 28.9 % — ABNORMAL LOW (ref 36.0–46.0)
Hemoglobin: 10.3 g/dL — ABNORMAL LOW (ref 12.0–15.0)
MCH: 32.4 pg (ref 26.0–34.0)
MCHC: 35.6 g/dL (ref 30.0–36.0)
MCV: 90.9 fL (ref 78.0–100.0)
Platelets: 211 10*3/uL (ref 150–400)
RBC: 3.18 MIL/uL — ABNORMAL LOW (ref 3.87–5.11)
RDW: 16.4 % — ABNORMAL HIGH (ref 11.5–15.5)
WBC: 7.8 10*3/uL (ref 4.0–10.5)

## 2014-04-09 LAB — CARBOXYHEMOGLOBIN
Carboxyhemoglobin: 0.8 % (ref 0.5–1.5)
Methemoglobin: 0.8 % (ref 0.0–1.5)
O2 SAT: 34.5 %
Total hemoglobin: 10.7 g/dL — ABNORMAL LOW (ref 12.0–16.0)

## 2014-04-09 MED ORDER — SODIUM CHLORIDE 0.9 % IJ SOLN: 10.0000 mL | Freq: Two times a day (BID) | INTRAMUSCULAR | Status: DC

## 2014-04-09 MED ORDER — MIDAZOLAM HCL 2 MG/2ML IJ SOLN
2.0000 mg | Freq: Once | INTRAMUSCULAR | Status: AC
  Administered 2014-04-09: 2 mg via INTRAVENOUS

## 2014-04-09 MED ORDER — FUROSEMIDE 10 MG/ML IJ SOLN
120.0000 mg | INTRAVENOUS | Status: AC
  Administered 2014-04-09: 120 mg via INTRAVENOUS
  Filled 2014-04-09: qty 12

## 2014-04-09 MED ORDER — ATROPINE SULFATE 1 % OP SOLN
2.0000 [drp] | OPHTHALMIC | Status: DC | PRN
  Filled 2014-04-09: qty 2

## 2014-04-09 MED ORDER — MORPHINE SULFATE 2 MG/ML IJ SOLN
2.0000 mg | INTRAMUSCULAR | Status: DC | PRN
  Administered 2014-04-09 (×2): 2 mg via INTRAVENOUS

## 2014-04-09 MED ORDER — SODIUM CHLORIDE 0.9 % IJ SOLN: 10.0000 mL | INTRAMUSCULAR | Status: DC | PRN

## 2014-04-09 MED ORDER — MORPHINE SULFATE 2 MG/ML IJ SOLN
INTRAMUSCULAR | Status: AC
  Administered 2014-04-09: 2 mg via INTRAVENOUS
  Filled 2014-04-09: qty 3

## 2014-04-09 MED ORDER — MORPHINE SULFATE 4 MG/ML IJ SOLN: 6.0000 mg | INTRAMUSCULAR | Status: DC

## 2014-04-09 MED ORDER — FUROSEMIDE 10 MG/ML IJ SOLN
40.0000 mg | Freq: Once | INTRAMUSCULAR | Status: AC
  Administered 2014-04-09: 40 mg via INTRAVENOUS
  Filled 2014-04-09: qty 4

## 2014-04-09 MED ORDER — SODIUM CHLORIDE 0.9 % IV SOLN
5.0000 mg/h | INTRAVENOUS | Status: DC
  Administered 2014-04-09: 5 mg/h via INTRAVENOUS
  Filled 2014-04-09: qty 10

## 2014-04-09 MED ORDER — MIDAZOLAM HCL 2 MG/2ML IJ SOLN
INTRAMUSCULAR | Status: AC
  Administered 2014-04-09: 2 mg via INTRAVENOUS
  Filled 2014-04-09: qty 4

## 2014-04-18 DIAGNOSIS — R570 Cardiogenic shock: Secondary | ICD-10-CM

## 2014-04-18 DIAGNOSIS — K3184 Gastroparesis: Secondary | ICD-10-CM

## 2014-04-18 DIAGNOSIS — R112 Nausea with vomiting, unspecified: Secondary | ICD-10-CM

## 2014-04-18 NOTE — Discharge Summary (Signed)
Death Summary   Patient ID: [name],  MRN: [id number], DOB/AGE: 08-10-1950 64 y.o.  Admit date: 04/15/2014 Discharge date: 04/18/2014  Primary Care Provider: [name] Primary Cardiologist: (previously Dr. Doreatha Lew) Dr. Percival Spanish Primary Electrophysiologist: Dr. Caryl Comes  Discharge Diagnoses Principal Problem:   Cardiogenic shock Active Problems:   Ischemic cardiomyopathy   AICD (automatic cardioverter/defibrillator) present - St Jude   S/P AV nodal ablation, 02/05/14   Sustained ventricular tachycardia   Complete heart block   Persistent atrial fibrillation   Constipated   Protein-calorie malnutrition, severe   Nausea   Hypotension (arterial)   Ventricular tachycardia   Acute systolic congestive heart failure, NYHA class 4   Gastroparesis   Nausea and vomiting   Procedures  Right heart catheterization 03/21/2014 Procedures performed:  1) Right heart catheterization 2) Swan-Ganz catheter placement  Description of procedure:   The risks and indication of the procedure were explained. Consent was signed and placed on the chart. An appropriate timeout was taken prior to the procedure. The right neck was prepped and draped in the routine sterile fashion and anesthetized with 1% local lidocaine.   An 8 FR venous sheath was placed in the right internal jugular vein using a modified Seldinger technique. A standard Swan-Ganz catheter was used for the procedure. The Gordy Councilman placement was confirmed under fluoro and the sheath was sutured in place.   Complications: None apparent.  Findings:  RA = 22 (v-waves = 38) RV = 40/21 (31) PA = 42/22 (30) PCW = 22 Fick cardiac output/index = 2.8/1.7 Thermo CO/CI = 1.7/1.1 PVR (Fick) = 2.8 WU PVR (thermo) = 4.7 WU Ao sat = 99% PA sat = 46%, 50%  Assessment:  She has moderately elevated biventricular pressures with severe biventricular failure and cardiogenic shock physiology. Prominent v waves in RA tracing suggestive of  severe TR. Will start levophed. Stop carvedilol. Continue attempts at gentle diuresis. Prognosis poor. Will discuss options with family.     Hospital Course  Mrs Harms is 64 yo AA female with PMH of CAD s/p multiple PCI, ICM s/p primary prevention ICD 2006 (last reimplantation 07/2013), PAF s/p PVI with inappropriate ICD discharges, VT s/p ICD shock, chronic systolic HF with EF 87-56% who presented to [facility] ED as a Code STEMI activated by EMS. ECG transmitted revealed wide complex tachycardia at a rate of 140-150s with R superior axis consistent with slow VT. She also complained of 6/10 substernal CP. After discussing with interventional cardiologist, code STEMI was cancelled as VT was the likely cause of her chest pain. ECG obtained in the ED confirmed VT. She was also treated for hyperkalemia in the ED upon resulting of initial labs. Amiodarone gtt and lidocaine was started. Patient converted to AV pacing at 60 bpm shortly after lidocaine bolus given. Arterial line was placed due to difficulty measuring her blood pressure with standard blood pressure cuff. Low Mg (1.5) was replaced on arrival as well. Other significant laboratory finding included Cr 1.57, neg trop, and lactic acid of 3.07 which is likely correlated with hypoperfusion during VT.   She was seen by Electrophysiology service on the following day, her ICD was reprogrammed. Electrolyte and potassium were both back to normal after supplementation. During initial hospital admission, she had significant nausea, however her VT was under control. She was transferred out of ICU on 03/29/2014. At that time, it was felt that her prognosis was very poor. Due to persistent nausea with food intolerance, amiodarone was decreased on 03/31/2014 at which time  her Cr was 1.59. She was seen by physical therapy who recommended SNF placement after discharge, However she continued to have significant nausea, therefore her amiodarone was decreased further to 200mg  QD. GI  was consulted on 1/12 who felt her nausea is likely the side effect to her amiodarone. However will check gastric emptying test to confirm no other underlying etiology. She completed gastric emptying test on 1/13 which was markedly abnormal with 71% retension of test meal at 2 hours whereas normal should be 30% or less. She was started on low dose reglan to help with bowel movement. She was also placed on clear liquid gastroparesis diet. After reglan, her nausea significant improved. In the morning of 04/07/2014, she did have some focal weakness with very mild L facial droop, overnight cardiology fellow was called, CT of head did not reveal any obvious etiology.  She was originally anticipated for discharge to SNF, however her renal function was noted to be declining despite improving symptom. It was unclear at the time whether she has low perfusion or fluid overload as her physical exam had mixed signs. A trial of IV lasix was attempted, however her Cr went up to 4.7 on 04/08/2014. Her symptom was felt to be consistent with hypoperfusion and cardiogenic shock. Nephrology was consulted who felt her declining renal function is likely cardiorenal. Advanced heart failure service was consulted for right heart cath. She was subsequent transferred to the ICU in anticipation of Lauralee Evener procedure. RHC obtained on the same day showed CO 2.8, CI 1.7, PCW 22, PA sat 46/50%. She had moderately elevated biventricular pressure with severe biventricular failure and cardiogenic shock physiology. Prominent v waves in RA tracing suggestive of severe TR. Levophed was started. Coreg was discontinued. Gentle diuresis was started. However overall prognosis was very poor.  Patient's condition deteriorated rapidly despite all intervention. She had multi-system organ failure. HF service discussed expectation with both her patient and her son. They opted for comfort care. Morphine and versed was started. ICD deactivated. Chaplain  service was enlisted to provide emotional and spiritual support. Patient expired around 2pm on 04/20/2014. The passing of Mrs Montini was confirmed on physical exam by 2 nurses.   Hilbert Corrigan PA-C Pager: [phone] 04/18/2014, 8:16 PM   Agree with above.   Benay Spice 3:34 PM

## 2014-04-21 NOTE — Clinical Social Work Placement (Signed)
     Clinical Social Work Department CLINICAL SOCIAL WORK PLACEMENT NOTE April 16, 2014  Patient:  Reinecke,Miosha R  Account Number:  [id number] Admit date:  04/13/2014  Clinical Social Worker:  Domenica Reamer, CLINICAL SOCIAL WORKER  Date/time:  04/01/2014 03:42 PM  Clinical Social Work is seeking post-discharge placement for this patient at the following level of care:   SKILLED NURSING   (*CSW will update this form in Epic as items are completed)   04/01/2014  Patient/family provided with [facility] of Clinical Social Works list of facilities offering this level of care within the geographic area requested by the patient (or if unable, by the patients family).  04/01/2014  Patient/family informed of their freedom to choose among providers that offer the needed level of care, that participate in Medicare, Medicaid or managed care program needed by the patient, have an available bed and are willing to accept the patient.  04/01/2014  Patient/family informed of [facility] ownership interest in [facility], as well as of the fact that they are under no obligation to receive care at this facility.  PASARR submitted to EDS on 04/01/2014 PASARR number received on 04/01/2014  FL2 transmitted to all facilities in geographic area requested by pt/family on  04/01/2014 FL2 transmitted to all facilities within larger geographic area on   Patient informed that his/her managed care company has contracts with or will negotiate with  certain facilities, including the following:     Patient/family informed of bed offers received:   Patient chooses bed at  Physician recommends and patient chooses bed at    Patient to be transferred to  on   Patient to be transferred to facility by  Patient and family notified of transfer on  Name of family member notified:    The following physician request were entered in Epic:   Additional Comments: Apr 16, 2014  Pateint expired this  afternoon. CSW signing off. Lorie Phenix.[place], [place]

## 2014-04-21 NOTE — Progress Notes (Signed)
Pt in respiratory distress, sats in 70's, placed on NRB mask, paged MD to the room.  Dr Haroldine Laws at the bedside and had lengthy discussion regarding pt's decline and poor prognosis.  Pt's brother decided to make pt a NCB.  Per Dr. Haroldine Laws, placed magnet on pt's defibrillator. Pt given morphine and versed per orders.  Morphine gtt orders also placed.  Family in agreement with comfort care measures.    Carol Ada, RN

## 2014-04-21 NOTE — Progress Notes (Signed)
Wasted 200cc of Morphine with Cristobal Goldmann, RN down the sink per protocol.    Carol Ada, RN

## 2014-04-21 NOTE — Progress Notes (Signed)
Pt expired, I listened for heart and lung sounds for a minute with Cristobal Goldmann, RN.    Carol Ada, RN

## 2014-04-21 NOTE — Progress Notes (Signed)
Attempted to do [place] numbers.  Unable to wedge catheter.  CO-1.25 CI-0.78.  Dr. Elias Else made aware. PCXR done confirming placement.  Dr. Elias Else aware of correct placement and questionable SGC numbers.  Will monitor pt.

## 2014-04-21 NOTE — Progress Notes (Signed)
Advanced Heart Failure Rounding Note   Subjective:    Now hypotensive and lethargic despite levophed. Worsening resp distress. On NRB.   Renal function and co-ox falling. Now oliguric. Cr 5.4  Swan numbers reviewed and confirm severe cardiogenic shock. Co -ox 34% CI 0.8 by thermo  Objective:   Weight Range:  Vital Signs:   Temp:  [95.7 F (35.4 C)-97.2 F (36.2 C)] 96.6 F (35.9 C) (01/20 0645) Pulse Rate:  [33-108] 70 (01/19 2015) Resp:  [14-38] 26 (01/20 0645) BP: (57-155)/(19-137) 110/70 mmHg (01/20 0645) SpO2:  [79 %-100 %] 97 % (01/20 0645) Weight:  [57.1 kg (125 lb 14.1 oz)] 57.1 kg (125 lb 14.1 oz) (01/20 0500) Last BM Date: 04/07/13  Weight change: Filed Weights   04/07/14 0500 03/26/2014 0435 04/28/14 0500  Weight: 56.4 kg (124 lb 5.4 oz) 56.9 kg (125 lb 7.1 oz) 57.1 kg (125 lb 14.1 oz)    Intake/Output:   Intake/Output Summary (Last 24 hours) at 04-28-14 0955 Last data filed at 04-28-2014 0600  Gross per 24 hour  Intake 217.84 ml  Output    175 ml  Net  42.84 ml     Physical Exam: General:  Dyspneic on NRB. LethargicChronically ill appearing.  HEENT: normal Neck: supple. JVP to ear . RIJ swan Carotids 2+ bilat; no bruits. No lymphadenopathy or thryomegaly appreciated. Cor: PMI laterally displaced. Regular rate & rhythm. +s3 Lungs: diffuse crackles  Abdomen: soft, nontender, + distended. No hepatosplenomegaly. No bruits or masses. Good bowel sounds. Extremities: no cyanosis, clubbing, rash, . Extremities cool. +edema Neuro: Lethargic responds to voice  Telemetry:  AV paced 70s   Labs: Basic Metabolic Panel:  Recent Labs Lab 04/03/14 0510 04/04/14 0611 04/05/14 0440 04/15/2014 0404 04/05/2014 1701 04-28-14 0455  NA 138 133* 132* 132*  --  133*  K 4.0 4.3 4.5 4.5  --  4.4  CL 100 104 98 103  --  99  CO2 22 22 19  16*  --  20  GLUCOSE 118* 92 104* 100*  --  142*  BUN 18 20 26* 41*  --  50*  CREATININE 2.11* 2.11* 2.57* 4.70* 5.00* 5.37*  CALCIUM  9.1 8.8 9.1 9.3  --  8.9  MG 1.8  --   --   --   --   --   PHOS  --   --   --   --   --  6.0*    Liver Function Tests:  Recent Labs Lab 03/28/2014 0404 2014-04-28 0455  AST 75*  --   ALT 35  --   ALKPHOS 79  --   BILITOT 1.6*  --   PROT 7.0  --   ALBUMIN 3.3* 2.9*   No results for input(s): LIPASE, AMYLASE in the last 168 hours. No results for input(s): AMMONIA in the last 168 hours.  CBC:  Recent Labs Lab 03/23/2014 1701 Apr 28, 2014 0455  WBC 5.7 7.8  HGB 10.4* 10.3*  HCT 29.0* 28.9*  MCV 90.6 90.9  PLT 220 211    Cardiac Enzymes: No results for input(s): CKTOTAL, CKMB, CKMBINDEX, TROPONINI in the last 168 hours.  BNP: BNP (last 3 results)  Recent Labs  06/04/13 0957 06/18/13 1327 02/05/14 2230  PROBNP 2458.0* 1223.0* 5151.0*     Other results:  EKG: 1/7 AV dissociation with v pacing  Imaging: Dg Chest Port 1 View  04-28-2014   CLINICAL DATA:  Check Swan-Ganz catheter placement.  EXAM: PORTABLE CHEST - 1 VIEW  COMPARISON:  04/03/2014  FINDINGS: New Swan-Ganz catheter from right IJ approach. The tip is in the region of the right main pulmonary artery. No interval displacement of biventricular ICD/pacer leads.  Unchanged cardiomegaly. No pulmonary edema or pleural effusion. Moderate retrocardiac atelectasis is unchanged.  IMPRESSION: 1. Gordy Councilman catheter tip at the right main pulmonary artery level. 2. Retrocardiac atelectasis. 3. No pneumothorax.   Electronically Signed   By: Jorje Guild M.D.   On: 04/21/14 00:27     Medications:     Scheduled Medications: . enoxaparin (LOVENOX) injection  30 mg Subcutaneous Q24H  . furosemide  120 mg Intravenous STAT  . metoCLOPramide  5 mg Oral TID AC & HS  . sodium chloride  10-40 mL Intracatheter Q12H  . sodium chloride  3 mL Intravenous Q12H  . sodium chloride  3 mL Intravenous Q12H    Infusions: . morphine 5 mg/hr (April 21, 2014 0931)    PRN Medications: [CANCELED] Place/Maintain arterial line **AND** sodium  chloride, sodium chloride, acetaminophen, atropine, clonazePAM, morphine injection, ondansetron (ZOFRAN) IV, polyethylene glycol, sodium chloride, sodium chloride   Assessment:  1 A/C Systolic HF EF 83-25% 06/9824 Ischemic cardiomyopathy  2.  AICD (automatic cardioverter/defibrillator) present - [employer] 3.  S/P AV nodal ablation, 02/05/14 4. V Tach on admit- ICD reprogrammed. on amiodarone 200 mg daily. Intolerant amio twice a day due to nausea. Mexelitine 200 mg  BID 5.  Persistent atrial fibrillation -Mali Vasc Score 6. On Eliquis 2.5 mg twice a day 6.  Hypotension  7.  Cardio Renal Syndrome.  8. Gastroparesis likely idiopathic per GI    Plan/Discussion:    She is actively dying of profound cardiogenic shock with multi-system organ failure. I discussed the situation with her and her son at the bedside. At this point the only viable options are intubation/CVVHD vs comfort care. Unfortunately, I doubt the former will actually lead to a meaningful outcome given her severe underlying biventricular HF. They have opted for comfort care. Will start morphine and versed. Deactivate ICD. Her brother is aware that she will likely pass today.  The patient is critically ill with multiple organ systems failure and requires high complexity decision making for assessment and support, frequent evaluation and titration of therapies, application of advanced monitoring technologies and extensive interpretation of multiple databases.   Critical Care Time personally devoted to patient care services described in this note is 35 Minutes.  Tison Leibold,MD 9:55 AM

## 2014-04-21 NOTE — Progress Notes (Addendum)
Patient c/o SOB. Periodically desating to low 80s but rebounds quickly. Coarse breath sounds to BLL. CVP and BP elevated. No significant UOP in past 5 hours. RT at bedside. Dr. Elias Else made aware and orders received.

## 2014-04-21 NOTE — Progress Notes (Signed)
[facility] Rounding Note Subjective:  Events of last 16 hours noted SGC in place Findings ar RHC c/w severe ventricular failure and cardiogenic shock CI 1.7 Pt now on norepi with some improvement in low BP Minimal (but some) UOP Issues during the night with acute dyspnea and a "bump" in BP Got Lasix 40 (unclear how that actually helped but the episode calmed down, UOP was about 150)  Objective Vital signs in last 24 hours: Filed Vitals:   04-12-2014 0600 Apr 12, 2014 0615 04/12/14 0630 Apr 12, 2014 0645  BP: 97/58 111/79 100/62 110/70  Pulse:      Temp: 95.9 F (35.5 C) 96.1 F (35.6 C) 96.4 F (35.8 C) 96.6 F (35.9 C)  TempSrc:      Resp: 18 18 23 26   Height:      Weight:      SpO2: 95% 88% 90% 97%   Weight change: 0.2 kg (7.1 oz)  Intake/Output Summary (Last 24 hours) at 04/12/14 3825 Last data filed at 2014-04-12 0600  Gross per 24 hour  Intake 337.84 ml  Output    175 ml  Net 162.84 ml   Physical Exam:  BP 110/70 mmHg  Pulse 70  Temp(Src) 96.6 F (35.9 C) (Oral)  Resp 26  Ht 5\' 4"  (1.626 m)  Wt 57.1 kg (125 lb 14.1 oz)  BMI 21.60 kg/m2  SpO2 97%  CVP 21 PAS/PAD 55/25 Catheter unable to wedge Continues to speak with eyes closed but indicates not nauseous or SOB (though did have drop in sats intermittently during the night) SGC right. Nasal O2. Foley. +JVD Anteirorly grossly clear S1S2 No S3 Rate 60's 1/6 M USB R chest ICD not Abd soft but still with some mild gen tenderness deep palp No edema  Labs: Recent Labs Lab 04/03/14 0510 04/04/14 0611 04/05/14 0440 03/28/2014 0404 03/25/2014 1701 04/12/2014 0455  NA 138 133* 132* 132*  --  133*  K 4.0 4.3 4.5 4.5  --  4.4  CL 100 104 98 103  --  99  CO2 22 22 19  16*  --  20  GLUCOSE 118* 92 104* 100*  --  142*  BUN 18 20 26* 41*  --  50*  CREATININE 2.11* 2.11* 2.57* 4.70* 5.00* 5.37*  CALCIUM 9.1 8.8 9.1 9.3  --  8.9  PHOS  --   --   --   --   --  6.0*    Recent Labs Lab 03/28/2014 0404  04/12/14 0455  AST 75*  --   ALT 35  --   ALKPHOS 79  --   BILITOT 1.6*  --   PROT 7.0  --   ALBUMIN 3.3* 2.9*    Recent Labs Lab 04/14/2014 1701 April 12, 2014 0455  WBC 5.7 7.8  HGB 10.4* 10.3*  HCT 29.0* 28.9*  MCV 90.6 90.9  PLT 220 211    Results for [name], [name] (MRN [id number]) as of April 12, 2014 07:11  Ref. Range 04/06/2014 14:48  Creatinine, Urine Latest Units: mg/dL 191.23  Sodium, Ur Latest Units: mmol/L 21  Fractional soldium excretion calculated at 0.41%  Studies/Results: Dg Chest Port 1 View  12-Apr-2014   CLINICAL DATA:  Check Swan-Ganz catheter placement.  EXAM: PORTABLE CHEST - 1 VIEW  COMPARISON:  03/26/2014  FINDINGS: New Swan-Ganz catheter from right IJ approach. The tip is in the region of the right main pulmonary artery. No interval displacement of biventricular ICD/pacer leads.  Unchanged cardiomegaly. No pulmonary edema or pleural effusion. Moderate retrocardiac atelectasis is unchanged.  IMPRESSION: 1. Gordy Councilman catheter tip at the right main pulmonary artery level. 2. Retrocardiac atelectasis. 3. No pneumothorax.   Electronically Signed   By: Jorje Guild M.D.   On: 2014/05/05 00:27   Medications: . norepinephrine (LEVOPHED) Adult infusion 6 mcg/min (05/05/2014 0415)   . amiodarone  200 mg Oral Daily  . docusate sodium  100 mg Oral BID  . enoxaparin (LOVENOX) injection  30 mg Subcutaneous Q24H  . feeding supplement (RESOURCE BREEZE)  1 Container Oral TID BM  . levothyroxine  200 mcg Oral QAC breakfast  . metoCLOPramide  5 mg Oral TID AC & HS  . mexiletine  200 mg Oral Q12H  . pantoprazole  40 mg Oral BID  . raloxifene  60 mg Oral Daily  . sodium bicarbonate  650 mg Oral TID  . sodium chloride  3 mL Intravenous Q12H  . sodium chloride  3 mL Intravenous Q12H  . sucralfate  1 g Oral TID WC & HS    I  have reviewed scheduled and prn medications.  Impression/Plan 1. AKI on CKD3 - d/t low output state/cardiogenic shock. Oliguric but some UOP (about 150/24  hours) FeNa <1 c/w low perfusion state. Creatinine however continues to rise.  Need to speak with Heart Failure Team here about expectations, but I would see long term prognosis as very poor, and in that vein am not even sure CRRT would be appropriate if cannot change long term prognosis but need to d/w Dr. Jeffie Pollock to see how he wishes to proceed. She would NOT tolerate long term HD and kidney function not likely to get better without a significant improvement in CO.  Electrolytes OK at this point.    2. Metabolic acidosis - s/p IV bicarb yesterday and on oral bicarb now. No gap yet. .  3. Cardiomyopathy with systolic heart failure/cardiogenic shock. On pressor support. See #1. 4. S/p WCT  5. CNS - remains unclear to me what her issue is with verbalization - CAN speak but only in a whisper. CT negative.   Jamal Maes, MD [facility] [phone] pager 2014/05/05, 7:02 AM

## 2014-04-21 NOTE — Progress Notes (Signed)
Physical Therapy Discharge Patient Details Name: [name] MRN: [id number] DOB: [birth year] Today's Date: 04/11/2014 Time:  -     Patient discharged from PT services secondary to medical decline.  Please see latest therapy progress note for current level of functioning and progress toward goals.    Progress and discharge plan discussed with patient and/or caregiver: Patient unable to participate  GP     [facility] 04-11-14, 12:03 PM   [facility] PT [phone]

## 2014-04-21 NOTE — Progress Notes (Signed)
Chaplain responded to page from RN.  Pt's brother in room at bedside.  Brother is emotional, seems to understand pt's medical condition and is waiting on pt's friend to arrive.  Asked if chaplain would return once "they" get here."  Chaplain provided emotional and spiritual support as well as the ministry of presence and empathetic listening.  Chaplain will follow up if/when paged back.    05-08-14 0955  Clinical Encounter Type  Visited With Patient and family together  Visit Type Initial;Psychological support;Spiritual support;Social support;Critical Care;Patient actively dying  Referral From Nurse  Spiritual Encounters  Spiritual Needs Emotional;Grief support  Stress Factors  Patient Stress Factors Exhausted  Family Stress Factors Exhausted;Family relationships;Loss;Major life changes  Advance Directives (For Healthcare)  Does patient have an advance directive? Yes   Geralyn Flash May 08, 2014

## 2014-04-21 DEATH — deceased

## 2014-04-22 ENCOUNTER — Encounter: Payer: Self-pay | Admitting: Internal Medicine

## 2014-05-30 ENCOUNTER — Encounter: Payer: Medicare Other | Admitting: Internal Medicine
